# Patient Record
Sex: Female | Born: 1940
Health system: Southern US, Community
[De-identification: ages and names within clinical notes are randomized; demographics above are authoritative.]

## PROBLEM LIST (undated history)

## (undated) DIAGNOSIS — K449 Diaphragmatic hernia without obstruction or gangrene: Secondary | ICD-10-CM

## (undated) DIAGNOSIS — M25561 Pain in right knee: Secondary | ICD-10-CM

## (undated) DIAGNOSIS — E785 Hyperlipidemia, unspecified: Secondary | ICD-10-CM

## (undated) DIAGNOSIS — H269 Unspecified cataract: Secondary | ICD-10-CM

## (undated) DIAGNOSIS — B029 Zoster without complications: Secondary | ICD-10-CM

## (undated) DIAGNOSIS — K635 Polyp of colon: Secondary | ICD-10-CM

## (undated) DIAGNOSIS — H3530 Unspecified macular degeneration: Secondary | ICD-10-CM

## (undated) DIAGNOSIS — D649 Anemia, unspecified: Secondary | ICD-10-CM

## (undated) DIAGNOSIS — J189 Pneumonia, unspecified organism: Secondary | ICD-10-CM

## (undated) DIAGNOSIS — J449 Chronic obstructive pulmonary disease, unspecified: Secondary | ICD-10-CM

## (undated) DIAGNOSIS — D496 Neoplasm of unspecified behavior of brain: Secondary | ICD-10-CM

## (undated) DIAGNOSIS — M810 Age-related osteoporosis without current pathological fracture: Secondary | ICD-10-CM

## (undated) DIAGNOSIS — H43819 Vitreous degeneration, unspecified eye: Secondary | ICD-10-CM

## (undated) DIAGNOSIS — C50919 Malignant neoplasm of unspecified site of unspecified female breast: Secondary | ICD-10-CM

## (undated) DIAGNOSIS — J45909 Unspecified asthma, uncomplicated: Secondary | ICD-10-CM

## (undated) DIAGNOSIS — R600 Localized edema: Secondary | ICD-10-CM

## (undated) HISTORY — DX: Unspecified cataract: H26.9

## (undated) HISTORY — DX: Unspecified asthma, uncomplicated: J45.909

## (undated) HISTORY — PX: CATARACT EXTRACTION, BILATERAL: SHX1313

## (undated) HISTORY — DX: Diaphragmatic hernia without obstruction or gangrene: K44.9

## (undated) HISTORY — PX: MASTECTOMY: SHX3

## (undated) HISTORY — DX: Polyp of colon: K63.5

## (undated) HISTORY — DX: Pneumonia, unspecified organism: J18.9

## (undated) HISTORY — DX: Neoplasm of unspecified behavior of brain: D49.6

## (undated) HISTORY — DX: Pain in right knee: M25.561

## (undated) HISTORY — PX: SKIN CANCER EXCISION: SHX779

## (undated) HISTORY — DX: Vitreous degeneration, unspecified eye: H43.819

## (undated) HISTORY — DX: Age-related osteoporosis without current pathological fracture: M81.0

## (undated) HISTORY — DX: Malignant neoplasm of unspecified site of unspecified female breast: C50.919

## (undated) HISTORY — DX: Chronic obstructive pulmonary disease, unspecified: J44.9

## (undated) HISTORY — DX: Unspecified macular degeneration: H35.30

## (undated) HISTORY — DX: Localized edema: R60.0

## (undated) HISTORY — DX: Anemia, unspecified: D64.9

## (undated) HISTORY — DX: Zoster without complications: B02.9

## (undated) HISTORY — PX: COLONOSCOPY: SHX174

## (undated) HISTORY — DX: Hyperlipidemia, unspecified: E78.5

## (undated) HISTORY — PX: CRANIECTOMY / CRANIOTOMY FOR EXCISION OF BRAIN TUMOR: SUR320

## (undated) HISTORY — PX: POLYPECTOMY: SHX149

## (undated) HISTORY — PX: PARTIAL HYSTERECTOMY: SHX80

---

## 1999-05-30 ENCOUNTER — Ambulatory Visit (HOSPITAL_COMMUNITY): Admission: RE | Admit: 1999-05-30 | Discharge: 1999-05-30 | Payer: Self-pay | Admitting: Gastroenterology

## 1999-10-23 ENCOUNTER — Encounter: Payer: Self-pay | Admitting: Gastroenterology

## 1999-10-23 ENCOUNTER — Ambulatory Visit (HOSPITAL_COMMUNITY): Admission: RE | Admit: 1999-10-23 | Discharge: 1999-10-23 | Payer: Self-pay | Admitting: Gastroenterology

## 2001-06-08 ENCOUNTER — Encounter (INDEPENDENT_AMBULATORY_CARE_PROVIDER_SITE_OTHER): Payer: Self-pay | Admitting: Specialist

## 2001-06-08 ENCOUNTER — Ambulatory Visit (HOSPITAL_COMMUNITY): Admission: RE | Admit: 2001-06-08 | Discharge: 2001-06-08 | Payer: Self-pay | Admitting: Gastroenterology

## 2005-01-21 ENCOUNTER — Ambulatory Visit (HOSPITAL_COMMUNITY): Admission: RE | Admit: 2005-01-21 | Discharge: 2005-01-21 | Payer: Self-pay | Admitting: Gastroenterology

## 2005-08-05 ENCOUNTER — Ambulatory Visit: Payer: Self-pay | Admitting: Cardiology

## 2005-08-26 ENCOUNTER — Ambulatory Visit: Payer: Self-pay

## 2005-09-11 ENCOUNTER — Ambulatory Visit: Payer: Self-pay | Admitting: Cardiology

## 2005-09-11 ENCOUNTER — Ambulatory Visit (HOSPITAL_COMMUNITY): Admission: RE | Admit: 2005-09-11 | Discharge: 2005-09-12 | Payer: Self-pay | Admitting: Cardiology

## 2005-09-24 ENCOUNTER — Ambulatory Visit: Payer: Self-pay | Admitting: Cardiology

## 2007-02-12 ENCOUNTER — Ambulatory Visit: Payer: Self-pay | Admitting: Gastroenterology

## 2007-02-12 LAB — CONVERTED CEMR LAB
Basophils Absolute: 0.1 10*3/uL (ref 0.0–0.1)
Basophils Relative: 0.6 % (ref 0.0–1.0)
Eosinophils Absolute: 0.6 10*3/uL (ref 0.0–0.6)
Eosinophils Relative: 6 % — ABNORMAL HIGH (ref 0.0–5.0)
HCT: 36.1 % (ref 36.0–46.0)
Hemoglobin: 11.8 g/dL — ABNORMAL LOW (ref 12.0–15.0)
Lymphocytes Relative: 38.7 % (ref 12.0–46.0)
MCHC: 32.7 g/dL (ref 30.0–36.0)
MCV: 81.2 fL (ref 78.0–100.0)
Monocytes Absolute: 0.7 10*3/uL (ref 0.2–0.7)
Monocytes Relative: 7.7 % (ref 3.0–11.0)
Neutro Abs: 4.3 10*3/uL (ref 1.4–7.7)
Neutrophils Relative %: 47 % (ref 43.0–77.0)
Platelets: 213 10*3/uL (ref 150–400)
RBC: 4.44 M/uL (ref 3.87–5.11)
RDW: 15 % — ABNORMAL HIGH (ref 11.5–14.6)
WBC: 9.3 10*3/uL (ref 4.5–10.5)

## 2007-02-23 ENCOUNTER — Encounter: Payer: Self-pay | Admitting: Gastroenterology

## 2007-02-23 ENCOUNTER — Ambulatory Visit: Payer: Self-pay | Admitting: Gastroenterology

## 2007-02-23 DIAGNOSIS — D126 Benign neoplasm of colon, unspecified: Secondary | ICD-10-CM | POA: Insufficient documentation

## 2007-08-13 ENCOUNTER — Observation Stay (HOSPITAL_COMMUNITY): Admission: EM | Admit: 2007-08-13 | Discharge: 2007-08-14 | Payer: Self-pay | Admitting: Emergency Medicine

## 2007-08-14 ENCOUNTER — Encounter: Payer: Self-pay | Admitting: Internal Medicine

## 2007-08-14 DIAGNOSIS — K2971 Gastritis, unspecified, with bleeding: Secondary | ICD-10-CM | POA: Insufficient documentation

## 2007-08-14 DIAGNOSIS — K2980 Duodenitis without bleeding: Secondary | ICD-10-CM | POA: Insufficient documentation

## 2007-08-14 DIAGNOSIS — K449 Diaphragmatic hernia without obstruction or gangrene: Secondary | ICD-10-CM | POA: Insufficient documentation

## 2007-08-14 DIAGNOSIS — K2991 Gastroduodenitis, unspecified, with bleeding: Secondary | ICD-10-CM

## 2007-08-18 ENCOUNTER — Ambulatory Visit: Payer: Self-pay | Admitting: Internal Medicine

## 2007-08-30 ENCOUNTER — Ambulatory Visit: Payer: Self-pay | Admitting: Gastroenterology

## 2007-08-30 LAB — CONVERTED CEMR LAB
Basophils Absolute: 0 10*3/uL (ref 0.0–0.1)
Basophils Relative: 0.4 % (ref 0.0–1.0)
Eosinophils Absolute: 0.4 10*3/uL (ref 0.0–0.6)
Eosinophils Relative: 6.1 % — ABNORMAL HIGH (ref 0.0–5.0)
HCT: 32.7 % — ABNORMAL LOW (ref 36.0–46.0)
Hemoglobin: 10.4 g/dL — ABNORMAL LOW (ref 12.0–15.0)
Lymphocytes Relative: 25.5 % (ref 12.0–46.0)
MCHC: 31.6 g/dL (ref 30.0–36.0)
MCV: 77.8 fL — ABNORMAL LOW (ref 78.0–100.0)
Monocytes Absolute: 0.4 10*3/uL (ref 0.2–0.7)
Monocytes Relative: 6.9 % (ref 3.0–11.0)
Neutro Abs: 4 10*3/uL (ref 1.4–7.7)
Neutrophils Relative %: 61.1 % (ref 43.0–77.0)
Platelets: 278 10*3/uL (ref 150–400)
RBC: 4.27 M/uL (ref 3.87–5.11)
RDW: 27.8 % — ABNORMAL HIGH (ref 11.5–14.6)
WBC: 6.5 10*3/uL (ref 4.5–10.5)

## 2007-11-22 DIAGNOSIS — F411 Generalized anxiety disorder: Secondary | ICD-10-CM | POA: Insufficient documentation

## 2007-11-22 DIAGNOSIS — F329 Major depressive disorder, single episode, unspecified: Secondary | ICD-10-CM

## 2007-11-22 DIAGNOSIS — E782 Mixed hyperlipidemia: Secondary | ICD-10-CM | POA: Insufficient documentation

## 2007-11-22 DIAGNOSIS — C50919 Malignant neoplasm of unspecified site of unspecified female breast: Secondary | ICD-10-CM | POA: Insufficient documentation

## 2007-11-22 DIAGNOSIS — F339 Major depressive disorder, recurrent, unspecified: Secondary | ICD-10-CM | POA: Insufficient documentation

## 2007-11-22 DIAGNOSIS — K219 Gastro-esophageal reflux disease without esophagitis: Secondary | ICD-10-CM | POA: Insufficient documentation

## 2007-11-22 DIAGNOSIS — J45909 Unspecified asthma, uncomplicated: Secondary | ICD-10-CM | POA: Insufficient documentation

## 2008-05-01 ENCOUNTER — Ambulatory Visit (HOSPITAL_COMMUNITY): Admission: RE | Admit: 2008-05-01 | Discharge: 2008-05-01 | Payer: Self-pay | Admitting: Family Medicine

## 2010-07-30 ENCOUNTER — Encounter: Payer: Self-pay | Admitting: Cardiology

## 2010-08-25 ENCOUNTER — Encounter: Payer: Self-pay | Admitting: Family Medicine

## 2010-12-17 NOTE — Assessment & Plan Note (Signed)
[facility]                         GASTROENTEROLOGY OFFICE NOTE   TANIEKA, POWNALL                   MRN:          [id number]  DATE:02/12/2007                            DOB:          [birth year]    REFERRING PHYSICIAN:  Ernestina Penna, M.D.   NEW GI CONSULTATION   REASON FOR REFERRAL:  Dr. Christell Constant asked me to evaluate Ms. Mccollom in  consultation for her heme-positive stool, chronic constipation.   HPI:  Krystal [name] is a very pleasant 70 year old woman who has had  trouble with constipation for many years.  She says usually she will  move her bowels once or twice a week at most.  She will get increasingly  uncomfortable until she finally moves her bowels.  She also has to  strain when she does move her bowels.  She does not see overt bleeding.  She has been followed by gastroenterologist, Dr. Dorena Cookey for several  years.  He performed a colonoscopy in 2002 for her for constipation and  a family history of colon polyps.  He did find 1 small ascending colon  polyp, but this was hyperplastic.  She was found to be heme-positive 4  years later.  He repeated this colonoscopy in June of 2006 and it was  normal.  His plan was to repeat the colonoscopy 5 years from then, which  would be 2011.  She recently had Hemoccult testing performed, and it was  positive out of 2 samples.   REVIEW OF SYSTEMS:  Notable for stable weight.  Otherwise, essentially  normal and is available on our nursing intake sheet.   PAST MEDICAL HISTORY:  Brain tumor removed in 1996, breast cancer in  June of 1979, hysterectomy in 1978, elevated cholesterol.   CURRENT MEDICATIONS:  Singulair.  Aspirin.  Zocor.  Prevacid.  Theophylline.  Zyrtec.  Fish oil.  Diazepam.   ALLERGIES:  FOSAMAX.   SOCIAL HISTORY:  Widowed.  Nonsmoker.  Nondrinker.   FAMILY HISTORY:  Mother with colon polyps.  No colon cancer in family.   PHYSICAL EXAM:  Height 5 feet 5 inches, weight 172  pounds.  Blood  pressure 122/82, pulse 88.  CONSTITUTIONAL:  Generally well-appearing.  NEUROLOGIC:  Alert and oriented x3.  EYES:  Extraocular motions intact.  MOUTH:  Oropharynx moist, no lesions.  NECK:  Supple.  No lymphadenopathy.  CARDIOVASCULAR:  Heart is regular rate and rhythm.  LUNGS:  Clear to auscultation bilaterally.  ABDOMEN:  Soft, nontender, nondistended, normal bowel sounds.  EXTREMITIES:  No lower extremity edema.  SKIN:  No rashes or lesions on the visible extremities.   ASSESSMENT AND PLAN:  A 70 year old woman with chronic constipation,  heme-positive stool.   First, for her chronic constipation, Zocor is a well-known drug that can  cause constipation.  This could be possibly changed to a different  cholesterol-lowering agent under the discretion of her primary care  physician or her cardiologist.  I have given her samples for MiraLax and  instructed her to take 1 scoop a day, increase to 2 scoops a day.  The  goal will be 1 bowel movement  every day or 2.   She has had 2 colonoscopies in the past 6 years, the last was just 2  years ago, and that was for heme-positive stool.  She is heme-positive  again, and I told her that it is very unlikely that she has anything new  growing in her colon, since she just had a normal colonoscopy 2 years  ago, but the safest thing would be to check again by colonoscopy, so I  will arrange for that to be done at her soonest convenience.  If that  colonoscopy is normal, I would advise no colorectal cancer screening  (including FOBT cards) for at least 5 years, and would consider simply  repeating colonoscopy 10 years from that date.  She will get a CBC to  check and see if she is anemic since she was heme-positive.  She does  not appear to be so clinically, but we will check just to be sure.     Rachael Fee, MD  Electronically Signed    DPJ/MedQ  DD: 02/12/2007  DT: 02/13/2007  Job #: [id number]   cc:   Ernestina Penna,  M.D.

## 2010-12-17 NOTE — Consult Note (Signed)
[name], [name]            ACCOUNT NO.:  [id number]   MEDICAL RECORD NO.:  [id number]          PATIENT TYPE:  OBV   LOCATION:  1846                         FACILITY:  [facility]   PHYSICIAN:  Iva Boop, MD,FACGDATE OF BIRTH:  September 22, 1940   DATE OF CONSULTATION:  08/13/2007  DATE OF DISCHARGE:                                 CONSULTATION   Referral from Dr. Kathi Der office today.  Patient complaining of fatigue  and hemoglobin found to be 6.9 with MCV of 67 on labs obtained  yesterday.   HISTORY OF PRESENT ILLNESS:  Krystal [name] is a pleasant 70 year old white  female who has a remote history of breast cancer, history of colon  polyps, gastroesophageal reflux disease, anemia and constipation.  Early  this century around the 2001 she did require 2 units of blood because of  anemia.  She has had intermittent problems with fecal occult blood-  positive stools but not overt bleeding such as melena or blood per  rectum.  The patient went to her primary care physician yesterday  complaining of dysuria, some increased pelvic discomfort and symptoms  consistent with a UTI.  She also says that she would see some blood on  the tissue from the urethra after wiping.  She was treated for a UTI  with Cipro and a pyridine drug.  Some serum lab work was obtained and  this included a CBC.  These came back showing a low hemoglobin of 6.2  with a low MCV of 66.8.  She was brought back to the office today and  stool was heme-positive, though it is unclear as to whether this was  melena or not.  She gives a history of having been on iron since around  2001, when she had required transfusions of blood to treat anemia.  About 2 months ago her insurance company would not pay for the  prescription refill and she stopped taking it.  She does say that for  about a week or more she has been weak, has had some dizziness but  nothing near syncope.  She denies chest pain, shortness of breath.  She  denies  use of nonsteroidals; in fact, she does not use aspirin because  it had been associated with a GI bleed in the past.  The patient does  take a daily Prevacid as she has for many years.   The patient has undergone multiple EGDs since 2001.  These days of  consistently show mild esophagitis and were performed by Dr. Dorena Cookey,  with the latest endoscopy having been performed June 2006 showing  minimal esophagitis.  She has also had a history of colon polyps and her  latest colonoscopy was performed by Dr. Christella Hartigan on February 23, 2008/  this  showed a small ascending colon polyp.   The patient tends to have chronic lower abdominal and pelvic pain, which  is not severe and is longstanding.  She also has been feeling a burning  tenderness when she touches a new finding of a tiny subcutaneous nodule  in the epigastrium and right upper quadrant.  She was to have an  ultrasound today but it is been cancelled because of the discovery of  the anemia.  The patient also said that she has not have been having any  lower extremity edema or shortness of breath.   The patient was seen at Dr. Kathi Der office today and he contacted Dr.  Christella Hartigan, who had her sent down to the emergency room at [facility] for  evaluation.   ALLERGIES:  To FOSAMAX, to EGGS, to MIACALCIN, to ACTONEL, to MEVACOR.   MEDICATIONS:  1. Cipro 250 mg one p.o. b.i.d. for 7 days.  She has had two doses of      this so far.  2. Phenazopyridine  one p.o. t.i.d. for 2 days.  3. Proventil inhaler 2 puffs two to three times a day as needed.  4. Lasix 20 mg p.o. b.i.d.  5. Fexofenadine 180 mg daily.  6. Simvastatin 20 mg daily.  7. Theophylline 200 mg CR once daily.  8. Singulair 10 mg daily.  9. Restasis eye drops 1 drop to each eye twice daily.  10.Prevacid 30 mg once daily.  11.Diazepam 5 mg one to three times a day p.r.n.  12.Darvocet-N 100 one p.o. daily p.r.n.  She can take it more often      but she just uses it once a day at  night.   PAST MEDICAL HISTORY:  1. Breast cancer of the right breast, status post mastectomy and      radiation therapy in 1979.  2. Status post nasal polypectomy in 1978.  3. Partial hysterectomy, 1978, ovaries are intact.  4. Status post resection of a benign brain lesion, 1986.  5. Asthma.  6. Anxiety.  7. Depression.  8. Seasonal rhinitis.  9. Colon polyps.  10.Gastroesophageal reflux disease.  11.Question of possible esophageal stricture in March 2001.  She was      dilated then by Dr. Madilyn Fireman.  12.Noncardiac chest pain in February 2007, underwent cardiac      catheterization showing EF of 65% and minimal plaque.   SOCIAL HISTORY:  The patient formerly worked on her farm.  She is a  widow.  She lives alone.  She is independent.  Prior tobacco use.  No  alcohol.   FAMILY HISTORY:  CABG, MI, CHF and pacemaker.  Mother had a history of  colon polyps.  There is no family history of colon cancer.   REVIEW OF SYSTEMS:  GU: As above.  PULMONARY:  Occasional dry cough.  ENT:  Does have chronic sinus congestion.  GI:  As above.  MUSCULOSKELETAL:  She has had intermittent longstanding right back and  shoulder pain.  Right now it has been a little more active than usual.  Appetite is good.  No weight loss.  Regarding her constipation, she has  used laxatives including MiraLax and milk of magnesia in the past but  they do not help a lot, so she has just quit using them and settles for  having very infrequent bowel movements.  Her last bowel movement was 4  days ago.  She does undergo yearly mammograms.  DERMATOLOGIC:  The  patient denies rashes, itching or history of skin cancer.   LABS:  Hemoglobin of 6.6 with hematocrit of 21.6, MCV of 68.4, platelets  of 289,000 and white blood cell count 5.8.  PT 13.1, INR 1.0, PTT is 27.  Sodium 138, potassium 3.7, chloride 108, CO2 25, glucose 105.  BUN 14,  creatinine 0.66, calcium 9.0.  She has been typed and crossed for  2  units of blood as  well.   EXAM:  Blood pressure 126/67, pulse is 97, temperature is 97.1, room air  saturation 99%.  Weight yesterday at Dr. Kathi Der office was 175 pounds.  The patient is a pale, pleasant, elderly white female.  HEENT:  No icterus.  Positive for pallor.  Extraocular movements are  intact.  Oropharynx:  Mucosa is moist and clear.  The patient does sound  to have nasal congestion but does not have active rhinorrhea.  NECK:  No JVD, no masses.  PULMONARY:  Chest is clear to auscultation and percussion bilaterally  with good breath sounds.  No cough.  No shortness of breath.  CARDIOVASCULAR:  Regular rate and rhythm.  No murmurs, rubs or gallops  audible.  GI:  Abdomen is soft, mildly obese, nondistended, with active bowel  sounds.  No hepatosplenomegaly.  There is a tiny sub-millimeter  subcutaneous lesion at the epigastric right upper quadrant region.  It  seems benign.  It does not seem to be tethered to tissues beneath the  subcutaneous layer.  It is slightly tender.  She also has some minimal  tenderness without guarding or rebound in the pelvic region that is  nonfocal.  RECTAL:  There is no stool bolus in the vault, and a scant amount of  brown stool is fecal occult blood-negative.  EXTREMITIES:  No cyanosis, clubbing or edema.  NEUROLOGIC:  The patient moves all fours.  She is alert and oriented x3.  No tremor.  DERMATOLOGIC:  No palmar erythema.  No spider angiomata.  NODES:  There are some swollen anterior cervical nodes bilaterally.   ASSESSMENT:  1. Recurrent anemia.  She has a low MCV and is almost certainly iron-      deficient.  She has not been taking her prescribed iron for 2      months.  She needs transfusions and she does have symptomatic      anemia in terms of weakness and dizziness.  2. Fecal occult blood-positive.  She had positive stools at Dr.      Kathi Der office but today in the ER the stool is negative although      the specimen is not ideal.  Question if  this is from      gastroesophageal reflux disease or perhaps the patient has small-      bowel arteriovenous malformations.  This would be probably more      likely given the extent of her anemia  3. Chronic constipation.  4. Longstanding gastroesophageal reflux disease, which has generally      been mild when scoped on various occasions within the last 10 knee      years.  She takes a daily proton pump inhibitor.  5. Remote history of breast cancer and remote history of benign brain      lesion.  6. Urinary tract infection, under treatment for this.  Note that she      does have blood per urethra which has been visible.  Perhaps this      is a more chronic thing than we are aware of and has contributed to      her anemia.   PLAN:  1. For her to get transfused with 2 units of blood today and then go      home.  Will plan to set up on outpatient visit with Dr. Christella Hartigan      within the next 2-3 weeks.  She has been advised to come  back, to      call Dr. Christella Hartigan or Dr. Christell Constant again is she has recurrent symptoms or      any obvious GI bleeding.  2. The patient may need a capsule endoscopy in order to a evaluate her      small bowel, but it has been at least a couple of years since she      had an upper endoscopies so probably should have an upper endoscopy      first before going to the capsule endoscopy. Will possible do that      tomorrow.  Besides restarting her on iron twice daily, we will also      have her take a vitamin C supplement to augment absorption of iron.      Jennye Moccasin, PA-C      Iva Boop, MD,FACG  Electronically Signed    SG/MEDQ  D:  08/13/2007  T:  08/13/2007  Job:  [id number]

## 2010-12-17 NOTE — Assessment & Plan Note (Signed)
[facility]                         GASTROENTEROLOGY OFFICE NOTE   THELDA, GAGAN                   MRN:          [id number]  DATE:08/30/2007                            DOB:          [birth year]    PRIMARY CARE PHYSICIAN:  Ernestina Penna, M.D.   GASTROINTESTINAL PROBLEM LIST:  1. Personal history of colonic adenomas.  Colonoscopy in July 2008,      single small colon polyp that was a tubular adenoma.  Next      colonoscopy scheduled for July 2013.  2. 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 Yancey Flemings by      endoscopy.  She is to be on iron indefinitely.   INTERVAL HISTORY:  I last saw Ms. Mcglynn in July 2008.  Since then she  was admitted to the hospital with rather extreme anemia, fatigue, no  overt GI bleeding but she was heme positive.  She was found to have a  hemoglobin in the upper 6s.  She was transfused 2 units.  She had an EGD  by one of my partners, Dr. Yancey Flemings that day or the next morning and  he found some fairly obvious Sheria Lang type erosions in a hiatal hernia.  She had been on iron prior to that procedure but was having some  insurance difficulties and was not on the iron for at least 2-3 months  prior to then.  Since then she has been on iron twice daily.   She has no chest pain, no shortness of breath, no syncope or presyncope.   CURRENT MEDICATIONS:  Singulair, aspirin, Zocor, Prevacid, Theo,  theophylline, fish oil, diazepam, fexofenadine, vitamin C, ferrous  sulfate twice daily.   PHYSICAL EXAMINATION:  VITAL SIGNS:  Weight 175 pounds which is up 3  pounds since her last visit.  Blood pressure 124/80, pulse 72.  CONSTITUTIONAL:  Generally well-appearing, slightly pale.  EYES:  Anicteric sclerae.  LUNGS:  Clear to auscultation bilaterally.  ABDOMEN:  Soft, nontender, nondistended.  Normal bowel sounds.   ASSESSMENT:  A 70 year old woman  with chronic heme positive anemia  likely from [facility] erosions in her hiatal hernia.   PLAN:  She will stay on iron indefinitely 1-2 pills daily.  I will get a  CBC on her today and likely also in 3 months from now just to make sure  that her hemoglobin gets back up to normal after  she has been on the iron supplements.  She is already set for followup  for her tubular adenomas and does not need routine GI visit prior to  that unless something new comes up.     Rachael Fee, MD  Electronically Signed    DPJ/MedQ  DD: 08/30/2007  DT: 08/30/2007  Job #: [id number]   cc:   Ernestina Penna, M.D.

## 2010-12-20 NOTE — Op Note (Signed)
NAMETRUDIE, [name]            ACCOUNT NO.:  [id number]   MEDICAL RECORD NO.:  [id number]          PATIENT TYPE:  AMB   LOCATION:  ENDO                         FACILITY:  [facility]   PHYSICIAN:  John C. Madilyn Fireman, M.D.    DATE OF BIRTH:  23-Mar-1941   DATE OF PROCEDURE:  01/21/2005  DATE OF DISCHARGE:                                 OPERATIVE REPORT   PROCEDURE:  Colonoscopy.   INDICATIONS FOR PROCEDURE:  Heme-positive stool   DESCRIPTION OF PROCEDURE:  The patient was placed in the left lateral  decubitus position and placed on pulse monitor with continuous low-flow  oxygen delivered by nasal cannula. She was sedated with 2 mg IV Versed in  addition to the medicine given for the previous EGD. The Olympus video  colonoscope was inserted into the rectum and advanced to the cecum,  confirmed by transillumination at McBurney's point and visualization of the  ileocecal valve and appendiceal orifice. The prep was excellent. The cecum,  ascending, transverse, descending and sigmoid colon all appeared normal with  no masses, polyps, diverticula or other mucosal abnormalities. The rectum  likewise appeared normal and retroflexed view of the anus revealed no  obvious internal hemorrhoids. The scope was then withdrawn and the patient  returned to the recovery room in stable condition. She tolerated the  procedure well and there were no immediate complications.   IMPRESSION:  Normal colonoscopy.   PLAN:  Repeat study in five years.       JCH/MEDQ  D:  01/21/2005  T:  01/21/2005  Job:  [id number]   cc:   Ernestina Penna, M.D.  [address] [place]  [place] [address]  Fax: [phone]

## 2010-12-20 NOTE — Cardiovascular Report (Signed)
Krystal [name], BLACKSON            ACCOUNT NO.:  [id number]   MEDICAL RECORD NO.:  [id number]          PATIENT TYPE:  OIB   LOCATION:  2899                         FACILITY:  [facility]   PHYSICIAN:  Rollene Rotunda, M.D.   DATE OF BIRTH:  July 18, 1941   DATE OF PROCEDURE:  09/11/2005  DATE OF DISCHARGE:                              CARDIAC CATHETERIZATION   PROCEDURE:  Left heart catheterization/coronary arteriography.   INDICATIONS FOR PROCEDURE:  Evaluate patient with an abnormal Cardiolite  suggesting moderate anterior wall ischemia.   PROCEDURE NOTE:  Left heart catheterization performed via the right femoral  artery.  The artery was cannulated using an anterior wall puncture.  A 6  French arterial sheath was inserted via modified Seldinger technique.  Preformed Judkins and a pigtail catheter were utilized.  The patient  tolerated the procedure well and left the lab in stable condition.   RESULTS:  Hemodynamics:  LV 151/18, AO 143/107.   Coronaries:  The left main was normal.  The LAD was normal.  There was a mid  diagonal which was moderate and normal.  The circumflex was essentially  terminating as a large mid obtuse marginal.  There was a small first obtuse  marginal which was normal.  The large mid obtuse marginal and AV groove  vessel was normal.  The right coronary artery was a large dominant vessel.  There was proximal 25% stenosis.  The PDA was large and normal.  The  posterolateral was large and normal.   Left ventriculogram:  The left ventriculogram was obtained in the RAO  projection.  The EF was approximately 65% with normal wall motion.   CONCLUSION:  Minimal coronary plaque.  Normal left ventricular function.   PLAN:  No further cardiac workup is suggested.  She will continue with  primary risk reduction per Dr. Christell Constant.           ______________________________  Rollene Rotunda, M.D.     JH/MEDQ  D:  09/11/2005  T:  09/11/2005  Job:  [id number]   cc:   Ernestina Penna, M.D.  Fax: [phone]

## 2010-12-20 NOTE — Procedures (Signed)
[place]  Patient:    Krystal [name], Krystal [name]                   MRN: [id number] Proc. Date: 10/23/99 Adm. Date:  [id number] Attending:  Louie Bun CC:         Monica Becton, M.D.                           Procedure Report  PROCEDURE PERFORMED:  Esophagogastroduodenoscopy with esophageal dilation.  INDICATION FOR PROCEDURE:  Patient with chronic poorly controlled reflux with severe esophagitis, possible esophagus ulcers on previous EGD.  She has developed dysphagia and reflux strictures suspected. Procedure is to assess healing of her previous esophagitis and esophageal ulcers and perform dilatation if felt safe.  DESCRIPTION OF PROCEDURE:  The patient was placed in the left lateral decubitus  position and placed on the pulse monitor with continuous low flow oxygen delivered by nasal cannula. She was sedated with 50 mg IV Demerol and 4 mg of IV Versed. The Olympus video endoscope was advanced under direct vision into the oropharynx and esophagus.  The esophagus was straight, slightly tortuous but of normal caliber  with the squamocolumnar line at 36 cm above a 4 cm hiatal hernia as before. There was minimal erosive esophagitis with much improvement in the appearance compared with the last procedure.  There was a possible subtle stricture appreciated when the LAS was fully relaxed.  The stomach was entered and a small amount of liquid secretions were suctioned from the fundus.  Retroflex view of the cardiac confirmed the large hiatal hernia and it was otherwise unremarkable.  The fundus, body, antrum and pylorus all appeared normal.  The duodenum was entered and both bulb and second portion were well inspected and appeared to be within normal limits.  A Savory guide wire was placed through the endoscope channel and the scope withdrawn. A single Savory dilator of 16 mm diameter was passed under fluoroscopic visualization with  moderate resistance and no blood seen on withdrawal.  The dilator was then withdrawn and the patient returned to the recovery room in stable condition. She tolerated the procedure well.  There were no immediate complications.  IMPRESSION: Hiatal hernia with healing of previous esophagitis and probable esophageal stricture.  PLAN:  Will advance diet and observe response of dilation.  Will consider aggressive antipeptic medication.  Follow up in the office in a few weeks and discuss the possibility of antireflux surgery. DD:  10/23/99 TD:  10/24/99 Job: [id number]

## 2010-12-20 NOTE — Op Note (Signed)
[name], Krystal [name]            ACCOUNT NO.:  [id number]   MEDICAL RECORD NO.:  [id number]          PATIENT TYPE:  AMB   LOCATION:  ENDO                         FACILITY:  [facility]   PHYSICIAN:  John C. Madilyn Fireman, M.D.    DATE OF BIRTH:  10-22-1940   DATE OF PROCEDURE:  01/21/2005  DATE OF DISCHARGE:                                 OPERATIVE REPORT   PROCEDURE:  Esophagogastroduodenoscopy.   INDICATIONS FOR PROCEDURE:  Epigastric abdominal pain and heme-positive  stools.   PROCEDURE:  The patient was placed in the left lateral decubitus position  and placed on the pulse monitor with continuous low-flow oxygen delivered by  nasal cannula.  She was sedated with 50 mcg IV fentanyl and 4 mg IV Versed.  The Olympus video endoscope was advanced under direct vision into the  oropharynx and esophagus.  The esophagus was straight and of normal caliber  with the squamocolumnar line at 36 cm.  There were minimal erosions at the  GE junction with no exudate, consistent with mild esophagitis.  There was a  4-cm hiatal hernia distal to the squamocolumnar line with no erosions or  ulcers seen within it.  The stomach was entered, and a small amount of  liquid secretions were suctioned from the fundus.  Retroflexed view of the  cardia confirmed a hiatal hernia, was otherwise unremarkable.  The fundus,  body, antrum and pylorus all appeared normal.  A CLO-test was obtained.  The  duodenum was entered, and both the bulb and second portion were well-  inspected and appeared to be within normal limits.  The scope was then  withdrawn and the patient prepared for colonoscopy.  She tolerated the  procedure well, and there were no immediate complications.   IMPRESSION:  1.  Hiatal hernia with minimal esophagitis.  2.  Otherwise normal study.   PLAN:  Will await CLO-test and treat for eradication if Helicobacter  positive.  Otherwise, treat with PPI and will proceed with colonoscopy.       JCH/MEDQ  D:   01/21/2005  T:  01/21/2005  Job:  [id number]   cc:   Ernestina Penna, M.D.  [address] [place]  [place] [address]  Fax: [phone]

## 2010-12-20 NOTE — Discharge Summary (Signed)
NAMEGRACELYNNE, BENEDICT            ACCOUNT NO.:  [id number]   MEDICAL RECORD NO.:  [id number]          PATIENT TYPE:  OIB   LOCATION:  5151                         FACILITY:  [facility]   PHYSICIAN:  Rollene Rotunda, M.D.   DATE OF BIRTH:  05-04-1941   DATE OF ADMISSION:  09/11/2005  DATE OF DISCHARGE:  09/12/2005                                 DISCHARGE SUMMARY   CHIEF COMPLAINT:  Chest pain, no significant coronary artery disease by  catheterization this admission (cath done secondary to abnormal Cardiolite.   SECONDARY DIAGNOSES:  1.  Dyspnea on exertion.  2.  Hyperlipidemia.  3.  Family history of coronary artery disease.  4.  Anxiety/depression.  5.  Peptic ulcer disease.  6.  Gastroesophageal reflux disease.  7.  Asthma.  8.  Breast cancer.  9.  Status post hysterectomy.  10. Nasal polyp resection.  Right mastectomy and a benign brain tumor      resection.   ALLERGIES OR INTOLERANCES:  To FOSAMAX with swelling and ANACIN with GI  bleed.   PROCEDURES:  1.  Cardiac catheterization  2.  Coronary arteriogram.  3.  Left ventriculogram.   HOSPITAL COURSE:  Time at discharge 22 minutes.  Krystal [name] is a 70 year old  female with no history of coronary artery disease.  She had dyspnea on  exertion that was occasionally associated with chest pain and Dr. Christell Constant  referred her to Dr. Antoine Poche.  Dr. Antoine Poche evaluated her and when her  stress test was abnormal, showing ischemia of the distal anterior wall and  apex.  A cardiac catheterization was recommended.  She was admitted for this  on September 11, 2005.   The cardiac catheterization showed a 25% dominant proximal RCA, but no other  disease, and her EF was normal at 65%.  He felt that she has noncardiac  dyspnea on exertion and chest pain; and recommended followup with Dr. Christell Constant.  The next day, Krystal [name] respiratory status was at baseline and her O2  saturation was 96% on room air.  Her groin was without ecchymosis, bruits  or  hematoma.  She was evaluated by Dr. Antoine Poche and considered stable for  discharge and is to followup with Dr. Christell Constant as an outpatient.   DISCHARGE INSTRUCTIONS:  Her activity level is to include no driving for 2  days and no lifting for a week.  She is to call our office for any problems  with the cath site.  She is to get a groin check within two weeks.  She is  to followup with Dr. Antoine Poche as needed.   DISCHARGE MEDICATIONS:  1.  __________ 400 mg a day.  2.  Singulair 10 mg a day.  3.  Zocor 20 mg a day.  4.  Zyrtec daily.  5.  Pravachol 30 mg a day.  6.  Valium 5 mg p.r.n.  7.  Darvocet p.r.n.  8.  DuoNebs q.h.s.  9.  She is to clarify with Dr. Christell Constant whether she should be on Zocor or      Pravachol.      Theodore Demark, P.A. [facility]  ______________________________  Rollene Rotunda, M.D.    RB/MEDQ  D:  09/12/2005  T:  09/12/2005  Job:  [id number]   cc:   Ernestina Penna, M.D.  Fax: [phone]   Rollene Rotunda, M.D.  [phone]  [place]  [place] [address]

## 2010-12-20 NOTE — H&P (Signed)
NAMEBRANNON, Krystal [name]            ACCOUNT NO.:  [id number]   MEDICAL RECORD NO.:  [id number]          PATIENT TYPE:  OIB   LOCATION:  2899                         FACILITY:  [facility]   PHYSICIAN:  Rollene Rotunda, M.D.   DATE OF BIRTH:  August 30, 1940   DATE OF ADMISSION:  09/11/2005  DATE OF DISCHARGE:                                HISTORY & PHYSICAL   PRIMARY CARE PHYSICIAN:  Dr. Rudi Heap   PRIMARY CARDIOLOGIST:  Dr. Rollene Rotunda   CHIEF COMPLAINT:  Dyspnea on exertion/chest pain.   HISTORY OF PRESENT ILLNESS:  Krystal [name] is a 70 year old female with no  known history of coronary artery disease.  She was evaluated by Dr. Antoine Poche  on August 05, 2005 after Dr. Vernon Prey referred her for dyspnea on exertion  that was occasionally associated with chest pain.   Krystal [name] describes the dyspnea on exertion as consistent, but says it has  not really changed much recently.  She also has hiatal hernia/reflux  symptoms and states that she gets chest pain both occasionally while lying  down which is improved by sitting up and while she is walking she will first  become short of breath and then get chest pain which resolves with rest.  The chest pain is substernal and radiates around under her left breast.  Whether the pain starts with or without exertion the pain is exactly the  same.  It is not consistent either with a supine position or with exertion.  She just gets it occasionally and feels she gets it several times a week.  She also complains of chronic orthopnea and occasional paroxysmal nocturnal  dyspnea.  She does not have any lower extremity edema or bloating.  She is  pain-free at the time of examination and denies shortness of breath.   PAST MEDICAL HISTORY:  She has no history of diabetes or hypertension.  She  has a history of hyperlipidemia and a family history of premature coronary  artery disease.  She also has a history of breast cancer, asthma,  anxiety/depression,  peptic ulcer disease, and gastroesophageal reflux  disease.   PAST SURGICAL HISTORY:  1.  Hysterectomy.  2.  Nasal polyp resection.  3.  Right mastectomy.  4.  Benign brain tumor resected in 1986.   ALLERGIES:  She had problems with FOSAMAX which she says caused swelling of  her tongue and her throat to close.  She took Anacin for a while for her hip  pain which she has chronically but she says this caused a bleeding ulcer  greater than 20 years ago and she has not had aspirin since.   CURRENT MEDICATIONS:  1.  Valium 5 mg up to t.i.d. p.r.n.  2.  Uniphyl 400 mg a day.  3.  Singulair 10 mg a day.  4.  Zocor 20 mg a day.  5.  Zyrtec daily.  6.  Pravachol 30 mg a day.  7.  Darvocet p.r.n.  8.  DuoNebs q.h.s.  9.  Prevacid 30 mg a day.   SOCIAL HISTORY:  [place].  She  is disabled.  She only smoked for a couple of years and quit more than 15  years ago.  She has never abused alcohol or drugs.   FAMILY HISTORY:  Her mother is alive at age 33 and has a pacemaker.  Her  father died in his 31s with a history of heart disease.  She has one brother  that died of a massive heart attack at age 65.  She has another brother that  is alive and has had bypass surgery twice and a sister that is also alive,  but has had bypass surgery.  She has other siblings with heart failure, but  no others with heart disease of which she is aware.   REVIEW OF SYSTEMS:  Significant for chronic hip pain which significantly  limits her activities and occasionally wakes her up at night.  She also has  occasional headaches.  She has reflux symptoms for which she takes Prevacid  30 mg a day.  The chest pain and dyspnea on exertion as described above.  She has chronic joint pains other than her hip.  She has occasional rhinitis  symptoms.   REVIEW OF SYSTEMS:  Otherwise negative.   PHYSICAL EXAMINATION:  VITAL SIGNS:  Her blood pressure is 127/49 with a  heart  rate of 92 and respiratory rate of 16, O2 saturation 96% on room air.  Temperature is 97.  GENERAL:  She is a well-developed, elderly white female in no acute  distress.  Her body mass index is 31.  HEENT:  Head is normocephalic, atraumatic with pupils are equal, round, and  reactive to light and accommodation.  NECK:  There is no JVD, no thyromegaly and no carotid bruits are  appreciated.  No adenopathy is noted.  LUNGS:  She has a few rales in the left base, but are otherwise clear.  CARDIOVASCULAR:  Her heart is regular in rate and rhythm with an S1 and S2  and no significant murmur, rub, or gallop is noted.  SKIN:  No rashes or lesions are noted.  ABDOMEN:  Soft and has active bowel sounds.  There is some slight upper  quadrant tenderness which she says is chronic for her.  There is no  guarding.  EXTREMITIES:  She has 2+ pulses in all four extremities and no femoral  bruits are appreciated.  MUSCULOSKELETAL:  There is no joint deformity or effusions and no spine or  CVA tenderness is noted.  NEUROLOGIC:  She is alert and oriented with cranial nerves II-XII grossly  intact.   EKG:  Sinus rhythm, rate 81 with no acute ischemic changes.   1.  Dyspnea on exertion/chest pain.  Krystal [name] had an adenosine Myoview      performed on August 26, 2005.  It showed an EF of 72%.  There was      moderate ischemia of the distal anterior wall and apex.  This was      evaluated by Dr. Antoine Poche and he felt that cardiac catheterization was      indicated.  She is here today for the procedure.  Krystal [name] is      otherwise stable and is to continue on her home medications including      Prevacid 30 mg a day.      She will need aspirin and Plavix if there is an intervention, but will      be followed carefully for increase in her reflux symptoms.  2.  Krystal [name] is  otherwise stable and will be continued on her other home      medications.     Theodore Demark, P.A. [facility]     ______________________________  Rollene Rotunda, M.D.    RB/MEDQ  D:  09/11/2005  T:  09/11/2005  Job:  [id number]

## 2010-12-20 NOTE — Procedures (Signed)
[facility]  Patient:    Krystal [name], Krystal [name] Number: [id number] MRN: [id number]          Service Type: END Location: ENDO Attending Physician:  Louie Bun Proc. Date: 06/08/01 Admit Date:  06/08/2001   CC:         Monica Becton, M.D.  Lynett Fish, M.D.   Procedure Report  PROCEDURE:  Colonoscopy with polypectomy.  INDICATION FOR PROCEDURE:  Family history of colon polyps, personal history of breast cancer and recent worsening of constipation.  DESCRIPTION OF PROCEDURE:  The patient was placed in the left lateral decubitus position and placed on the pulse monitor with continuous low-flow oxygen delivered by nasal cannula.  She was sedated with 40 mg of IV Demerol and 4 mg of IV Versed.  The Olympus video colonoscope was inserted into the rectum and advanced to the cecum, confirmed by transillumination at McBurneys point and visualization of the ileocecal valve and appendiceal orifice.  The prep was excellent.  Within the base of the cecum, there was an 8 mm sessile polyp which was fulgurated by hot biopsy.  The remainder of the cecum, ascending, transverse, descending, and colon appeared normal with no masses, polyps, diverticula, or other mucosal abnormalities.  The rectum likewise appeared normal, and retroflexed view of the anus revealed no obvious internal hemorrhoids.  The colonoscope was then withdrawn, and the patient returned to the recovery room in stable condition.  She tolerated the procedure well, and there were no immediate complications.  IMPRESSION: 1. Ascending colon polyp. 2. Otherwise normal colonoscopy. Attending Physician:  Louie Bun DD:  06/08/01 TD:  06/09/01 Job: [id number]

## 2010-12-26 ENCOUNTER — Other Ambulatory Visit: Payer: Self-pay

## 2011-04-24 LAB — CBC
HCT: 28.6 — ABNORMAL LOW
Hemoglobin: 8.9 — ABNORMAL LOW
MCHC: 30.7
MCHC: 31.2
MCV: 68.4 — ABNORMAL LOW
MCV: 71.2 — ABNORMAL LOW
Platelets: 289
RBC: 4.01
RDW: 20.3 — ABNORMAL HIGH

## 2011-04-24 LAB — CROSSMATCH

## 2011-04-24 LAB — PROTIME-INR: Prothrombin Time: 13.1

## 2011-04-24 LAB — BASIC METABOLIC PANEL
BUN: 14
CO2: 25
Chloride: 108
Creatinine, Ser: 0.66

## 2011-04-24 LAB — ABO/RH: ABO/RH(D): O POS

## 2011-09-02 DIAGNOSIS — J441 Chronic obstructive pulmonary disease with (acute) exacerbation: Secondary | ICD-10-CM | POA: Diagnosis not present

## 2011-09-02 DIAGNOSIS — R5383 Other fatigue: Secondary | ICD-10-CM | POA: Diagnosis not present

## 2011-09-02 DIAGNOSIS — E785 Hyperlipidemia, unspecified: Secondary | ICD-10-CM | POA: Diagnosis not present

## 2011-09-02 DIAGNOSIS — R0602 Shortness of breath: Secondary | ICD-10-CM | POA: Diagnosis not present

## 2011-09-02 DIAGNOSIS — R5381 Other malaise: Secondary | ICD-10-CM | POA: Diagnosis not present

## 2011-09-02 DIAGNOSIS — R072 Precordial pain: Secondary | ICD-10-CM | POA: Diagnosis not present

## 2011-09-05 DIAGNOSIS — Z1212 Encounter for screening for malignant neoplasm of rectum: Secondary | ICD-10-CM | POA: Diagnosis not present

## 2011-12-31 DIAGNOSIS — J441 Chronic obstructive pulmonary disease with (acute) exacerbation: Secondary | ICD-10-CM | POA: Diagnosis not present

## 2011-12-31 DIAGNOSIS — E785 Hyperlipidemia, unspecified: Secondary | ICD-10-CM | POA: Diagnosis not present

## 2011-12-31 DIAGNOSIS — D485 Neoplasm of uncertain behavior of skin: Secondary | ICD-10-CM | POA: Diagnosis not present

## 2012-01-01 ENCOUNTER — Encounter: Payer: Self-pay | Admitting: Gastroenterology

## 2012-02-10 ENCOUNTER — Ambulatory Visit (AMBULATORY_SURGERY_CENTER): Payer: Medicare Other

## 2012-02-10 VITALS — Ht 65.0 in | Wt 144.9 lb

## 2012-02-10 DIAGNOSIS — Z86010 Personal history of colonic polyps: Secondary | ICD-10-CM

## 2012-02-10 DIAGNOSIS — Z1211 Encounter for screening for malignant neoplasm of colon: Secondary | ICD-10-CM

## 2012-02-10 MED ORDER — MOVIPREP 100 G PO SOLR: ORAL | Status: DC

## 2012-02-10 NOTE — Progress Notes (Signed)
Pt was reminded to bring her inhaler (proair) to her appt on 02/24/12. Ulis Rias RN

## 2012-02-24 ENCOUNTER — Encounter: Payer: Self-pay | Admitting: Gastroenterology

## 2012-02-24 ENCOUNTER — Ambulatory Visit (AMBULATORY_SURGERY_CENTER): Payer: Medicare Other | Admitting: Gastroenterology

## 2012-02-24 ENCOUNTER — Other Ambulatory Visit: Payer: Self-pay | Admitting: Gastroenterology

## 2012-02-24 VITALS — BP 161/79 | HR 83 | Temp 96.0°F | Resp 16 | Ht 65.0 in | Wt 144.0 lb

## 2012-02-24 DIAGNOSIS — D126 Benign neoplasm of colon, unspecified: Secondary | ICD-10-CM | POA: Diagnosis not present

## 2012-02-24 DIAGNOSIS — Z86010 Personal history of colonic polyps: Secondary | ICD-10-CM | POA: Diagnosis not present

## 2012-02-24 DIAGNOSIS — Z1211 Encounter for screening for malignant neoplasm of colon: Secondary | ICD-10-CM

## 2012-02-24 MED ORDER — SODIUM CHLORIDE 0.9 % IV SOLN: 500.0000 mL | INTRAVENOUS | Status: DC

## 2012-02-24 NOTE — Patient Instructions (Addendum)
YOU HAD AN ENDOSCOPIC PROCEDURE TODAY AT THE [facility]: Refer to the procedure report that was given to you for any specific questions about what was found during the examination.  If the procedure report does not answer your questions, please call your gastroenterologist to clarify.  If you requested that your care partner not be given the details of your procedure findings, then the procedure report has been included in a sealed envelope for you to review at your convenience later.  YOU SHOULD EXPECT: Some feelings of bloating in the abdomen. Passage of more gas than usual.  Walking can help get rid of the air that was put into your GI tract during the procedure and reduce the bloating. If you had a lower endoscopy (such as a colonoscopy or flexible sigmoidoscopy) you may notice spotting of blood in your stool or on the toilet paper. If you underwent a bowel prep for your procedure, then you may not have a normal bowel movement for a few days.  DIET: Your first meal following the procedure should be a light meal and then it is ok to progress to your normal diet.  A half-sandwich or bowl of soup is an example of a good first meal.  Heavy or fried foods are harder to digest and may make you feel nauseous or bloated.  Likewise meals heavy in dairy and vegetables can cause extra gas to form and this can also increase the bloating.  Drink plenty of fluids but you should avoid alcoholic beverages for 24 hours.  ACTIVITY: Your care partner should take you home directly after the procedure.  You should plan to take it easy, moving slowly for the rest of the day.  You can resume normal activity the day after the procedure however you should NOT DRIVE or use heavy machinery for 24 hours (because of the sedation medicines used during the test).    SYMPTOMS TO REPORT IMMEDIATELY: A gastroenterologist can be reached at any hour.  During normal business hours, 8:30 AM to 5:00 PM Monday through Friday,  call [phone].  After hours and on weekends, please call the GI answering service at [phone] who will take a message and have the physician on call contact you.   Following lower endoscopy (colonoscopy or flexible sigmoidoscopy):  Excessive amounts of blood in the stool  Significant tenderness or worsening of abdominal pains  Swelling of the abdomen that is new, acute  Fever of 100F or higher  FOLLOW UP: If any biopsies were taken you will be contacted by phone or by letter within the next 1-3 weeks.  Call your gastroenterologist if you have not heard about the biopsies in 3 weeks.  Our staff will call the home number listed on your records the next business day following your procedure to check on you and address any questions or concerns that you may have at that time regarding the information given to you following your procedure. This is a courtesy call and so if there is no answer at the home number and we have not heard from you through the emergency physician on call, we will assume that you have returned to your regular daily activities without incident.  SIGNATURES/CONFIDENTIALITY: You and/or your care partner have signed paperwork which will be entered into your electronic medical record.  These signatures attest to the fact that that the information above on your After Visit Summary has been reviewed and is understood.  Full responsibility of the confidentiality of this   discharge information lies with you and/or your care-partner.    Resume medications. Information given on polyps with discharge instructions. 

## 2012-02-24 NOTE — Progress Notes (Signed)
Inhaler at bedside with patient

## 2012-02-24 NOTE — Op Note (Signed)
[facility] [address] [place], [place]  [address]  COLONOSCOPY PROCEDURE REPORT  PATIENT:  Krystal [name], Krystal [name]  MR#:  [id number] BIRTHDATE:  08/24/1940, 70 yrs. old  GENDER:  female ENDOSCOPIST:  Rachael Fee, MD PROCEDURE DATE:  02/24/2012 PROCEDURE:  Colonoscopy with biopsy and snare polypectomy ASA CLASS:  Class II INDICATIONS:  tubular adenoma 2008 MEDICATIONS:   Fentanyl 25 mcg IV, These medications were titrated to patient response per physician's verbal order, Versed 2 mg IV  DESCRIPTION OF PROCEDURE:   After the risks benefits and alternatives of the procedure were thoroughly explained, informed consent was obtained.  Digital rectal exam was performed and revealed no rectal masses.   The LB CF-[id number] endoscope was introduced through the anus and advanced to the cecum, which was identified by both the appendix and ileocecal valve, without limitations.  The quality of the prep was good..  The instrument was then slowly withdrawn as the colon was fully examined. <<PROCEDUREIMAGES>> FINDINGS:  Two sessile polyps were found, removed. One was 2mm across, located in ascending segment, removed with biopsy forceps, sent to pathology (jar 1). The other was 5mm across, located in descending segment, removed with cold snare, not retrieved (see image3 and image4).  This was otherwise a normal examination of the colon (see image1 and image7).   Retroflexed views in the rectum revealed no abnormalities. COMPLICATIONS:  None  ENDOSCOPIC IMPRESSION: 1) Two small polyps were found, removed; one was retrieved and sent to pathology 2) Otherwise normal examination  RECOMMENDATIONS: 1) Given your personal history of adenomatous (pre-cancerous) polyps, you will need a repeat colonoscopy in 5 years even if the polyps removed today are NOT precancerous.  ______________________________ Rachael Fee, MD  n. eSIGNED:   Rachael Fee at 02/24/2012 10:45  AM  Molli Barrows, [id number]

## 2012-02-24 NOTE — Progress Notes (Signed)
Patient did not experience any of the following events: a burn prior to discharge; a fall within the facility; wrong site/side/patient/procedure/implant event; or a hospital transfer or hospital admission upon discharge from the facility. (G8907) Patient did not have preoperative order for IV antibiotic SSI prophylaxis. (G8918)  

## 2012-02-25 ENCOUNTER — Telehealth: Payer: Self-pay | Admitting: *Deleted

## 2012-02-25 NOTE — Telephone Encounter (Signed)
  Follow up Call-  Call back number 02/24/2012  Post procedure Call Back phone  # [phone]  Permission to leave phone message Yes     Patient questions:  Do you have a fever, pain , or abdominal swelling? no Pain Score  0 *  Have you tolerated food without any problems? yes  Have you been able to return to your normal activities? yes  Do you have any questions about your discharge instructions: Diet   no Medications  no Follow up visit  no  Do you have questions or concerns about your Care? no  Actions: * If pain score is 4 or above: No action needed, pain <4.

## 2012-03-02 ENCOUNTER — Encounter: Payer: Self-pay | Admitting: Gastroenterology

## 2012-06-30 DIAGNOSIS — R070 Pain in throat: Secondary | ICD-10-CM | POA: Diagnosis not present

## 2012-06-30 DIAGNOSIS — J029 Acute pharyngitis, unspecified: Secondary | ICD-10-CM | POA: Diagnosis not present

## 2012-06-30 DIAGNOSIS — J441 Chronic obstructive pulmonary disease with (acute) exacerbation: Secondary | ICD-10-CM | POA: Diagnosis not present

## 2012-06-30 DIAGNOSIS — E785 Hyperlipidemia, unspecified: Secondary | ICD-10-CM | POA: Diagnosis not present

## 2012-07-06 DIAGNOSIS — R5381 Other malaise: Secondary | ICD-10-CM | POA: Diagnosis not present

## 2012-07-06 DIAGNOSIS — E559 Vitamin D deficiency, unspecified: Secondary | ICD-10-CM | POA: Diagnosis not present

## 2012-07-06 DIAGNOSIS — I10 Essential (primary) hypertension: Secondary | ICD-10-CM | POA: Diagnosis not present

## 2012-07-06 DIAGNOSIS — E785 Hyperlipidemia, unspecified: Secondary | ICD-10-CM | POA: Diagnosis not present

## 2012-10-15 DIAGNOSIS — R1011 Right upper quadrant pain: Secondary | ICD-10-CM | POA: Diagnosis not present

## 2012-10-15 DIAGNOSIS — R109 Unspecified abdominal pain: Secondary | ICD-10-CM | POA: Diagnosis not present

## 2012-10-19 ENCOUNTER — Other Ambulatory Visit: Payer: Self-pay | Admitting: Family Medicine

## 2012-10-19 DIAGNOSIS — R52 Pain, unspecified: Secondary | ICD-10-CM

## 2012-10-21 ENCOUNTER — Other Ambulatory Visit: Payer: Medicare Other

## 2012-10-21 ENCOUNTER — Ambulatory Visit
Admission: RE | Admit: 2012-10-21 | Discharge: 2012-10-21 | Disposition: A | Discharge status: Home / Self Care | Payer: Medicare Other | Source: Ambulatory Visit | Attending: Family Medicine | Admitting: Family Medicine

## 2012-10-21 DIAGNOSIS — R52 Pain, unspecified: Secondary | ICD-10-CM

## 2012-10-21 DIAGNOSIS — R1011 Right upper quadrant pain: Secondary | ICD-10-CM | POA: Diagnosis not present

## 2012-10-22 ENCOUNTER — Telehealth: Payer: Self-pay | Admitting: *Deleted

## 2012-10-23 ENCOUNTER — Other Ambulatory Visit: Payer: Self-pay | Admitting: Family Medicine

## 2012-10-27 ENCOUNTER — Telehealth: Payer: Self-pay | Admitting: Family Medicine

## 2012-10-27 NOTE — Telephone Encounter (Signed)
Spoke with patient and notified her of neg ultrasound.

## 2012-10-27 NOTE — Telephone Encounter (Signed)
Patient was looking for the results of the testing that she had at [facility]. Please call with results

## 2012-10-31 ENCOUNTER — Other Ambulatory Visit: Payer: Self-pay | Admitting: Family Medicine

## 2012-11-04 ENCOUNTER — Ambulatory Visit (INDEPENDENT_AMBULATORY_CARE_PROVIDER_SITE_OTHER): Payer: Medicare Other | Admitting: Family Medicine

## 2012-11-04 ENCOUNTER — Encounter: Payer: Self-pay | Admitting: Family Medicine

## 2012-11-04 VITALS — BP 126/72 | HR 91 | Temp 97.6°F | Ht 63.25 in | Wt 145.0 lb

## 2012-11-04 DIAGNOSIS — R5381 Other malaise: Secondary | ICD-10-CM

## 2012-11-04 DIAGNOSIS — K449 Diaphragmatic hernia without obstruction or gangrene: Secondary | ICD-10-CM

## 2012-11-04 DIAGNOSIS — Z860100 Personal history of colon polyps, unspecified: Secondary | ICD-10-CM

## 2012-11-04 DIAGNOSIS — E785 Hyperlipidemia, unspecified: Secondary | ICD-10-CM

## 2012-11-04 DIAGNOSIS — J4489 Other specified chronic obstructive pulmonary disease: Secondary | ICD-10-CM

## 2012-11-04 DIAGNOSIS — C50919 Malignant neoplasm of unspecified site of unspecified female breast: Secondary | ICD-10-CM

## 2012-11-04 DIAGNOSIS — J449 Chronic obstructive pulmonary disease, unspecified: Secondary | ICD-10-CM

## 2012-11-04 DIAGNOSIS — C50911 Malignant neoplasm of unspecified site of right female breast: Secondary | ICD-10-CM

## 2012-11-04 DIAGNOSIS — K219 Gastro-esophageal reflux disease without esophagitis: Secondary | ICD-10-CM

## 2012-11-04 NOTE — Progress Notes (Signed)
  Subjective:    Patient ID: Krystal [name], female    DOB: 1941-03-06, 72 y.o.   MRN: [id number]  HPI This patient presents for recheck of multiple medical problems. No one accompanies the patient today.  Patient Active Problem List  Diagnosis  . NEOPLASM, MALIGNANT, BREAST  . ADENOMATOUS COLONIC POLYP  . HYPERLIPIDEMIA  . ANXIETY  . DEPRESSION  . ASTHMA, UNSPECIFIED, UNSPECIFIED STATUS  . GERD  . GASTRITIS, WITH HEMORRHAGE  . DUODENITIS, WITHOUT HEMORRHAGE  . HIATAL HERNIA    In addition, see review of systems  The allergies, current medications, past medical history, surgical history, family and social history are reviewed.  Immunizations reviewed.  Health maintenance reviewed.  The following items are outstanding: Patient  refuses immunizations      Review of Systems  HENT: Positive for sneezing (light).   Eyes: Positive for itching (dryness).  Respiratory: Negative.   Cardiovascular: Negative.   Gastrointestinal: Positive for abdominal pain (R mid quad, mostly at night) and constipation. Negative for vomiting and diarrhea.  Genitourinary: Negative.   Musculoskeletal: Positive for myalgias (L thigh) and arthralgias (R knee R hip).  Neurological: Negative.   Psychiatric/Behavioral: Positive for sleep disturbance (interrupted due to taking care of elderly mother).       Objective:   Physical Exam        Assessment & Plan:

## 2012-11-04 NOTE — Patient Instructions (Addendum)
FOBT given  Fall precautions discussed Continue current meds and therapeutic lifestyle changes  Mammogram will be scheduled here at the earliest available slot  Try Zyrtec or Allegra for allergy Also Nasacort AQ one spray each nostril at bedtime

## 2012-11-30 ENCOUNTER — Other Ambulatory Visit: Payer: Self-pay | Admitting: *Deleted

## 2012-11-30 MED ORDER — DIAZEPAM 5 MG PO TABS: 5.0000 mg | ORAL_TABLET | Freq: Four times a day (QID) | ORAL | Status: DC | PRN

## 2012-11-30 NOTE — Telephone Encounter (Signed)
See note

## 2012-11-30 NOTE — Telephone Encounter (Signed)
Patient last seen on 11-04-12. Last filled rx on 10-31-12.Please advise. If approved please have nurse phone in to [facility] pharmacy. [phone]. Thank you

## 2012-12-01 NOTE — Telephone Encounter (Signed)
Called to [facility] pharmacy

## 2012-12-24 ENCOUNTER — Telehealth: Payer: Self-pay | Admitting: Nurse Practitioner

## 2012-12-24 ENCOUNTER — Telehealth: Payer: Self-pay | Admitting: Family Medicine

## 2012-12-24 ENCOUNTER — Other Ambulatory Visit: Payer: Self-pay | Admitting: Nurse Practitioner

## 2012-12-24 MED ORDER — AZITHROMYCIN 250 MG PO TABS: ORAL_TABLET | ORAL | Status: DC

## 2012-12-24 NOTE — Telephone Encounter (Signed)
Mae - can we call in something for this pt- allergies and meds are correct in epic. [facility].

## 2012-12-24 NOTE — Telephone Encounter (Signed)
error 

## 2012-12-24 NOTE — Telephone Encounter (Signed)
Yes, may call zyrtec 10mg  in daily.

## 2012-12-24 NOTE — Telephone Encounter (Signed)
Please advise 

## 2012-12-24 NOTE — Telephone Encounter (Signed)
Has a sinus problem and wants an antibiotic please.

## 2012-12-29 ENCOUNTER — Other Ambulatory Visit: Payer: Self-pay | Admitting: Family Medicine

## 2013-01-25 ENCOUNTER — Other Ambulatory Visit: Payer: Self-pay | Admitting: Nurse Practitioner

## 2013-01-27 NOTE — Telephone Encounter (Signed)
Last seen 11/04/12, last filled 10/31/12

## 2013-02-21 ENCOUNTER — Other Ambulatory Visit: Payer: Self-pay | Admitting: Family Medicine

## 2013-02-23 ENCOUNTER — Other Ambulatory Visit: Payer: Self-pay | Admitting: *Deleted

## 2013-02-23 MED ORDER — DIAZEPAM 5 MG PO TABS: 5.0000 mg | ORAL_TABLET | Freq: Four times a day (QID) | ORAL | Status: DC | PRN

## 2013-02-23 NOTE — Telephone Encounter (Signed)
RX for Valium called into [facility] Pt notified 

## 2013-02-23 NOTE — Telephone Encounter (Signed)
RX for Valium called into [facility] Pt notified

## 2013-02-23 NOTE — Telephone Encounter (Signed)
LAST RF 01/25/13. LAST OV 4/14. CALL IN [facility] IF APPROVED

## 2013-03-04 ENCOUNTER — Other Ambulatory Visit (INDEPENDENT_AMBULATORY_CARE_PROVIDER_SITE_OTHER): Payer: Medicare Other

## 2013-03-04 DIAGNOSIS — R5381 Other malaise: Secondary | ICD-10-CM | POA: Diagnosis not present

## 2013-03-04 DIAGNOSIS — R5383 Other fatigue: Secondary | ICD-10-CM | POA: Diagnosis not present

## 2013-03-04 DIAGNOSIS — E785 Hyperlipidemia, unspecified: Secondary | ICD-10-CM | POA: Diagnosis not present

## 2013-03-04 LAB — THYROID PANEL WITH TSH
Free Thyroxine Index: 3.4 (ref 1.0–3.9)
T3 Uptake: 38.5 % — ABNORMAL HIGH (ref 22.5–37.0)

## 2013-03-04 LAB — POCT CBC
Hemoglobin: 14.1 g/dL (ref 12.2–16.2)
MPV: 7.9 fL (ref 0–99.8)
POC Granulocyte: 3.2 (ref 2–6.9)
RBC: 4.8 M/uL (ref 4.04–5.48)

## 2013-03-04 LAB — HEPATIC FUNCTION PANEL
Bilirubin, Direct: 0.1 mg/dL (ref 0.0–0.3)
Total Bilirubin: 0.6 mg/dL (ref 0.3–1.2)

## 2013-03-04 LAB — BASIC METABOLIC PANEL WITH GFR
CO2: 28 mEq/L (ref 19–32)
GFR, Est African American: >89 mL/min
Glucose, Bld: 103 mg/dL — ABNORMAL HIGH (ref 70–99)
Potassium: 4.2 mEq/L (ref 3.5–5.3)
Sodium: 138 mEq/L (ref 135–145)

## 2013-03-04 NOTE — Progress Notes (Signed)
Pt came in for labs only 

## 2013-03-07 LAB — NMR LIPOPROFILE WITH LIPIDS
Cholesterol, Total: 152 mg/dL (ref <200)
HDL Size: 9.7 nm (ref >=9.2)
LDL (calc): 71 mg/dL (ref <100)
LDL Particle Number: 759 nmol/L (ref <1000)
LP-IR Score: 26 (ref <=45)
Small LDL Particle Number: 410 nmol/L (ref <=527)
VLDL Size: 41.6 nm (ref <=46.6)

## 2013-03-08 ENCOUNTER — Telehealth: Payer: Self-pay | Admitting: *Deleted

## 2013-03-08 NOTE — Telephone Encounter (Signed)
Message copied by Bearl Mulberry on Tue Mar 08, 2013  6:11 PM ------      Message from: Ernestina Penna      Created: Mon Mar 07, 2013  1:27 PM       The thyroid tests are within normal limits.      Liver function tests are within normal limits      Electrolytes and kidney function tests are within normal limits. Blood sugar is slightly increased at 103.      Vitamin D is good at 64      All cholesterol numbers by advanced lipid testing are excellent and within normal limits-continue current treatment and aggressive therapeutic lifestyle changes ------

## 2013-03-08 NOTE — Telephone Encounter (Signed)
Pt notified of results

## 2013-03-09 ENCOUNTER — Encounter: Payer: Self-pay | Admitting: Family Medicine

## 2013-03-09 ENCOUNTER — Ambulatory Visit (INDEPENDENT_AMBULATORY_CARE_PROVIDER_SITE_OTHER): Payer: Medicare Other | Admitting: Family Medicine

## 2013-03-09 VITALS — BP 90/48 | HR 102 | Temp 98.1°F | Ht 63.0 in | Wt 145.0 lb

## 2013-03-09 DIAGNOSIS — K219 Gastro-esophageal reflux disease without esophagitis: Secondary | ICD-10-CM

## 2013-03-09 DIAGNOSIS — C50919 Malignant neoplasm of unspecified site of unspecified female breast: Secondary | ICD-10-CM | POA: Diagnosis not present

## 2013-03-09 DIAGNOSIS — E785 Hyperlipidemia, unspecified: Secondary | ICD-10-CM

## 2013-03-09 DIAGNOSIS — F3289 Other specified depressive episodes: Secondary | ICD-10-CM | POA: Diagnosis not present

## 2013-03-09 DIAGNOSIS — F329 Major depressive disorder, single episode, unspecified: Secondary | ICD-10-CM | POA: Diagnosis not present

## 2013-03-09 DIAGNOSIS — F411 Generalized anxiety disorder: Secondary | ICD-10-CM

## 2013-03-09 NOTE — Progress Notes (Signed)
  Subjective:    Patient ID: Krystal [name], female    DOB: 17-Mar-1941, 72 y.o.   MRN: [id number]  HPI Patient comes in today for followup and management of chronic medical problems. These problems include hyperlipidemia, GERD, COPD, and osteoporosis. She also has a history of colon polyps and a remote history of breast cancer. She has seasonal allergic rhinitis. She also has a history of a brain tumor in 1986 and hysterectomy secondary to dysfunctional uterine bleeding in the 1970s. 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 Her mom has been in the hospital a couple times since May.   Review of Systems  Constitutional: Positive for fatigue (slight).  HENT: Positive for congestion (slight) and postnasal drip. Negative for ear pain, sore throat and sinus pressure.   Eyes: Negative.  Negative for photophobia, pain, discharge, redness, itching and visual disturbance.  Respiratory: Negative.  Negative for cough, choking, chest tightness and wheezing.   Cardiovascular: Positive for leg swelling (intermitent). Negative for chest pain and palpitations.  Gastrointestinal: Positive for nausea (intermitent), abdominal pain and constipation. Negative for vomiting, diarrhea, blood in stool, anal bleeding and rectal pain.  Endocrine: Negative for cold intolerance, heat intolerance, polydipsia, polyphagia and polyuria.  Genitourinary: Negative.  Negative for dysuria, urgency, frequency, hematuria, vaginal bleeding, vaginal discharge and vaginal pain.  Musculoskeletal: Positive for arthralgias (R knee and ankle). Negative for myalgias, back pain, joint swelling and gait problem.  Skin: Negative.  Negative for color change, pallor, rash and wound.  Allergic/Immunologic: Positive for environmental allergies (seasonal).  Neurological: Positive for headaches. Negative for dizziness, tremors, syncope, weakness, light-headedness and numbness.   Psychiatric/Behavioral: Positive for sleep disturbance (occasional). Negative for confusion. The patient is not nervous/anxious.        Objective:   Physical Exam BP 90/48  Pulse 102  Temp(Src) 98.1 F (36.7 C) (Oral)  Ht 5\' 3"  (1.6 m)  Wt 145 lb (65.772 kg)  BMI 25.69 kg/m2  The patient appeared well nourished and normally developed, alert and oriented to time and place. Speech, behavior and judgement appear normal. Vital signs as documented.  Head exam is unremarkable. No scleral icterus or pallor noted. Ears nose and throat were within normal limits/. She does wear dentures.  Neck is without jugular venous distension, thyromegally, or carotid bruits. Carotid upstrokes are brisk bilaterally. No cervical adenopathy. Lungs are clear anteriorly and posteriorly to auscultation. Normal respiratory effort. She has had a right mastectomy. Cardiac exam reveals regular rate and rhythm at 84 per minute. First and second heart sounds normal.  No murmurs, rubs or gallops.  Abdominal exam reveals normal bowl sounds, no masses, no organomegaly and no aortic enlargement. No inguinal adenopathy. Extremities are nonedematous and both femoral and pedal pulses are normal. Skin without pallor or jaundice.  Warm and dry, without rash. Neurologic exam reveals normal deep tendon reflexes and normal sensation.  Recent labs which were all excellent were reviewed with patient today       Assessment & Plan:  HYPERLIPIDEMIA  NEOPLASM, MALIGNANT, BREAST  GERD  DEPRESSION  ANXIETY   Patient Instructions  Fall precautions discussed Continue current meds and therapeutic lifestyle changes Return FOBT Will schedule patient for a mammogram this month   Nyra Capes MD

## 2013-03-09 NOTE — Patient Instructions (Addendum)
Fall precautions discussed Continue current meds and therapeutic lifestyle changes Return FOBT Will schedule patient for a mammogram this month

## 2013-03-21 ENCOUNTER — Encounter: Payer: Self-pay | Admitting: *Deleted

## 2013-03-28 DIAGNOSIS — Z1231 Encounter for screening mammogram for malignant neoplasm of breast: Secondary | ICD-10-CM | POA: Diagnosis not present

## 2013-04-06 ENCOUNTER — Other Ambulatory Visit: Payer: Self-pay | Admitting: *Deleted

## 2013-04-06 MED ORDER — TRAMADOL HCL 50 MG PO TABS: ORAL_TABLET | ORAL | Status: DC

## 2013-04-06 NOTE — Telephone Encounter (Signed)
LAST OV 03/09/13. LAST RF 03/06/13. PRINT AND CALL PT. THANKS.

## 2013-04-09 NOTE — Telephone Encounter (Signed)
Called into [facility] 

## 2013-04-12 DIAGNOSIS — H43819 Vitreous degeneration, unspecified eye: Secondary | ICD-10-CM | POA: Diagnosis not present

## 2013-04-12 DIAGNOSIS — H353190 Nonexudative age-related macular degeneration, unspecified eye, stage unspecified: Secondary | ICD-10-CM | POA: Diagnosis not present

## 2013-04-12 DIAGNOSIS — H5200 Hypermetropia, unspecified eye: Secondary | ICD-10-CM | POA: Diagnosis not present

## 2013-04-12 DIAGNOSIS — H52229 Regular astigmatism, unspecified eye: Secondary | ICD-10-CM | POA: Diagnosis not present

## 2013-04-19 DIAGNOSIS — H353190 Nonexudative age-related macular degeneration, unspecified eye, stage unspecified: Secondary | ICD-10-CM | POA: Diagnosis not present

## 2013-04-19 DIAGNOSIS — H3560 Retinal hemorrhage, unspecified eye: Secondary | ICD-10-CM | POA: Diagnosis not present

## 2013-04-19 DIAGNOSIS — H353290 Exudative age-related macular degeneration, unspecified eye, stage unspecified: Secondary | ICD-10-CM | POA: Diagnosis not present

## 2013-04-19 DIAGNOSIS — Z961 Presence of intraocular lens: Secondary | ICD-10-CM | POA: Diagnosis not present

## 2013-04-29 ENCOUNTER — Other Ambulatory Visit: Payer: Self-pay | Admitting: Family Medicine

## 2013-05-02 ENCOUNTER — Other Ambulatory Visit: Payer: Self-pay

## 2013-05-02 DIAGNOSIS — H43819 Vitreous degeneration, unspecified eye: Secondary | ICD-10-CM | POA: Diagnosis not present

## 2013-05-02 DIAGNOSIS — H353190 Nonexudative age-related macular degeneration, unspecified eye, stage unspecified: Secondary | ICD-10-CM | POA: Diagnosis not present

## 2013-05-02 DIAGNOSIS — Z961 Presence of intraocular lens: Secondary | ICD-10-CM | POA: Diagnosis not present

## 2013-05-02 MED ORDER — DIAZEPAM 5 MG PO TABS: 5.0000 mg | ORAL_TABLET | Freq: Four times a day (QID) | ORAL | Status: DC | PRN

## 2013-05-02 NOTE — Telephone Encounter (Signed)
Med called to pharm 

## 2013-05-02 NOTE — Telephone Encounter (Signed)
Last seen 03/09/13  [facility]  If approved route to nurse to phone into [facility]

## 2013-05-02 NOTE — Telephone Encounter (Signed)
This prescription is okay for 6 month 

## 2013-05-03 DIAGNOSIS — R928 Other abnormal and inconclusive findings on diagnostic imaging of breast: Secondary | ICD-10-CM | POA: Diagnosis not present

## 2013-05-03 DIAGNOSIS — R922 Inconclusive mammogram: Secondary | ICD-10-CM | POA: Diagnosis not present

## 2013-05-29 ENCOUNTER — Other Ambulatory Visit: Payer: Self-pay | Admitting: Family Medicine

## 2013-06-01 ENCOUNTER — Other Ambulatory Visit: Payer: Self-pay | Admitting: Family Medicine

## 2013-06-03 ENCOUNTER — Other Ambulatory Visit: Payer: Self-pay | Admitting: *Deleted

## 2013-06-03 MED ORDER — TRAMADOL HCL 50 MG PO TABS: ORAL_TABLET | ORAL | Status: DC

## 2013-06-03 NOTE — Telephone Encounter (Signed)
Last filled 04/29/13, last seen 03/09/13. Rx will print

## 2013-06-09 DIAGNOSIS — H5710 Ocular pain, unspecified eye: Secondary | ICD-10-CM | POA: Diagnosis not present

## 2013-06-09 DIAGNOSIS — H353190 Nonexudative age-related macular degeneration, unspecified eye, stage unspecified: Secondary | ICD-10-CM | POA: Diagnosis not present

## 2013-06-09 DIAGNOSIS — Z961 Presence of intraocular lens: Secondary | ICD-10-CM | POA: Diagnosis not present

## 2013-06-09 DIAGNOSIS — H43819 Vitreous degeneration, unspecified eye: Secondary | ICD-10-CM | POA: Diagnosis not present

## 2013-06-21 ENCOUNTER — Encounter: Payer: Self-pay | Admitting: Family Medicine

## 2013-06-21 ENCOUNTER — Ambulatory Visit (INDEPENDENT_AMBULATORY_CARE_PROVIDER_SITE_OTHER): Payer: Medicare Other | Admitting: Family Medicine

## 2013-06-21 VITALS — BP 128/74 | HR 83 | Temp 97.2°F | Ht 63.0 in | Wt 153.0 lb

## 2013-06-21 DIAGNOSIS — F329 Major depressive disorder, single episode, unspecified: Secondary | ICD-10-CM | POA: Diagnosis not present

## 2013-06-21 DIAGNOSIS — E785 Hyperlipidemia, unspecified: Secondary | ICD-10-CM

## 2013-06-21 DIAGNOSIS — M545 Low back pain, unspecified: Secondary | ICD-10-CM

## 2013-06-21 DIAGNOSIS — F3289 Other specified depressive episodes: Secondary | ICD-10-CM | POA: Diagnosis not present

## 2013-06-21 DIAGNOSIS — K219 Gastro-esophageal reflux disease without esophagitis: Secondary | ICD-10-CM | POA: Diagnosis not present

## 2013-06-21 DIAGNOSIS — Z780 Asymptomatic menopausal state: Secondary | ICD-10-CM

## 2013-06-21 DIAGNOSIS — Z23 Encounter for immunization: Secondary | ICD-10-CM | POA: Diagnosis not present

## 2013-06-21 DIAGNOSIS — E559 Vitamin D deficiency, unspecified: Secondary | ICD-10-CM

## 2013-06-21 DIAGNOSIS — G8929 Other chronic pain: Secondary | ICD-10-CM

## 2013-06-21 DIAGNOSIS — D509 Iron deficiency anemia, unspecified: Secondary | ICD-10-CM

## 2013-06-21 DIAGNOSIS — J309 Allergic rhinitis, unspecified: Secondary | ICD-10-CM

## 2013-06-21 LAB — POCT CBC
Granulocyte percent: 66.4 %G (ref 37–80)
HCT, POC: 42.5 % (ref 37.7–47.9)
Lymph, poc: 1.6 (ref 0.6–3.4)
MCH, POC: 28.6 pg (ref 27–31.2)
MCHC: 32.3 g/dL (ref 31.8–35.4)
MCV: 88.7 fL (ref 80–97)
MPV: 8.3 fL (ref 0–99.8)
POC Granulocyte: 3.6 (ref 2–6.9)
POC LYMPH PERCENT: 30 %L (ref 10–50)
Platelet Count, POC: 144 10*3/uL (ref 142–424)
RDW, POC: 13.2 %
WBC: 5.4 10*3/uL (ref 4.6–10.2)

## 2013-06-21 NOTE — Progress Notes (Signed)
Subjective:    Patient ID: Krystal [name], female    DOB: Oct 31, 1940, 71 y.o.   MRN: [id number]  HPI Pt here for follow up and management of chronic medical problems. 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        Patient Active Problem List   Diagnosis Date Noted  . NEOPLASM, MALIGNANT, BREAST 11/22/2007  . HYPERLIPIDEMIA 11/22/2007  . ANXIETY 11/22/2007  . DEPRESSION 11/22/2007  . ASTHMA, UNSPECIFIED, UNSPECIFIED STATUS 11/22/2007  . GERD 11/22/2007  . GASTRITIS, WITH HEMORRHAGE 08/14/2007  . DUODENITIS, WITHOUT HEMORRHAGE 08/14/2007  . HIATAL HERNIA 08/14/2007  . 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   Outpatient Encounter Prescriptions as of 06/21/2013  Medication Sig  . aspirin 81 MG tablet Take 81 mg by mouth daily.  . cetirizine (ZYRTEC) 10 MG tablet Take 10 mg by mouth as needed.  . cholecalciferol (VITAMIN D) 1000 UNITS tablet Take by mouth daily. Take 5,000 units daily  . diazepam (VALIUM) 5 MG tablet Take 1 tablet (5 mg total) by mouth every 6 (six) hours as needed.  . ferrous sulfate 325 (65 FE) MG EC tablet Take 325 mg by mouth daily.  . fish oil-omega-3 fatty acids 1000 MG capsule Take 1 g by mouth daily.  . furosemide (LASIX) 20 MG tablet Take 20 mg by mouth as needed.  . montelukast (SINGULAIR) 10 MG tablet TAKE ONE TABLET BY MOUTH AT BEDTIME  . Multiple Vitamins-Minerals (PRESERVISION/LUTEIN PO) Take 2 capsules by mouth daily.  Marland Kitchen NEXIUM 40 MG capsule TAKE 1 CAPSULE BY MOUTH ONCE  A DAY  . PROAIR HFA 108 (90 BASE) MCG/ACT inhaler INHALE TWO PUFFS BY MOUTH 4 TIMES DAILY AS NEEDED  . simvastatin (ZOCOR) 20 MG tablet Take 20 mg by mouth every evening.  . theophylline (THEO-24) 200 MG 24 hr capsule Take 200 mg by mouth daily.  . traMADol (ULTRAM) 50 MG tablet TAKE ONE TABLET BY MOUTH TWICE DAILY AS NEEDED  . [DISCONTINUED] PROAIR HFA 108 (90 BASE)  MCG/ACT inhaler INHALE TWO PUFFS BY MOUTH 4 TIMES DAILY AS NEEDED    Review of Systems  Constitutional: Negative.   HENT: Negative.   Eyes: Negative.   Respiratory: Negative.   Cardiovascular: Negative.   Gastrointestinal: Negative.   Endocrine: Negative.   Genitourinary: Negative.   Musculoskeletal: Positive for arthralgias (left thigh pain).  Skin: Negative.   Allergic/Immunologic: Negative.   Neurological: Negative.   Hematological: Negative.   Psychiatric/Behavioral: Negative.        Objective:   Physical Exam  Nursing note and vitals reviewed. Constitutional: She is oriented to person, place, and time. She appears well-developed and well-nourished. No distress.  Kyphotic  HENT:  Head: Normocephalic and atraumatic.  Right Ear: External ear normal.  Left Ear: External ear normal.  Nose: Nose normal.  Mouth/Throat: Oropharynx is clear and moist. No oropharyngeal exudate.  Eyes: Conjunctivae and EOM are normal. Pupils are equal, round, and reactive to light. Right eye exhibits no discharge. Left eye exhibits no discharge. No scleral icterus.  Neck: Normal range of motion. Neck supple. No JVD present. No thyromegaly present.  Cardiovascular: Normal rate, regular rhythm, normal heart sounds and intact distal pulses.  Exam reveals no gallop and no friction rub.   No murmur heard. At 72 per minute  Pulmonary/Chest: Effort normal and breath sounds normal. No respiratory distress. She has no wheezes. She has no rales. She exhibits  no tenderness.  Abdominal: Soft. Bowel sounds are normal. She exhibits no mass. There is tenderness. There is no rebound and no guarding.  Right upper and right quadrant tenderness, no inguinal adenopathy  Genitourinary:  Patient has had a right mastectomy. The left breast was checked and there was no indication of any mass. The axillary regions in both breasts were negative  Musculoskeletal: Normal range of motion. She exhibits no edema and no  tenderness.  Lymphadenopathy:    She has no cervical adenopathy.  Neurological: She is alert and oriented to person, place, and time. She has normal reflexes. No cranial nerve deficit.  Skin: Skin is warm and dry.  Psychiatric: She has a normal mood and affect. Her behavior is normal. Judgment and thought content normal.   BP 128/74  Pulse 83  Temp(Src) 97.2 F (36.2 C) (Oral)  Ht 5\' 3"  (1.6 m)  Wt 153 lb (69.4 kg)  BMI 27.11 kg/m2        Assessment & Plan:  1. GERD - POCT CBC  2. HYPERLIPIDEMIA - POCT CBC - BMP8+EGFR - Hepatic function panel - NMR, lipoprofile  3. DEPRESSION - POCT CBC  4. Postmenopausal - DG Bone Density; Future - POCT CBC  5. Vitamin D deficiency - Vit D  25 hydroxy (rtn osteoporosis monitoring)  6. Anemia, iron deficiency -Continue iron -Return FOBT  7. Allergic rhinitis  8. Chronic low back pain  9. Right mastectomy secondary to breast cancer -Check left breast regularly -Mammogram next September -Order placed for prosthesis for right breast(Bra)   Patient Instructions  Continue current medications. Continue good therapeutic lifestyle changes which include good diet and exercise. Fall precautions discussed with patient. If you are over 64 years old - you may need Prevnar 13 or the adult Pneumonia vaccine. Bring back FOBT Continue to check left breast regularly Mammogram will be due again next September  Breast prosthesis order placed at [facility]  Nyra Capes MD

## 2013-06-21 NOTE — Addendum Note (Signed)
Addended by: Azalee Course on: 06/21/2013 03:07 PM   Modules accepted: Orders

## 2013-06-21 NOTE — Patient Instructions (Addendum)
Continue current medications. Continue good therapeutic lifestyle changes which include good diet and exercise. Fall precautions discussed with patient. If you are over 74 years old - you may need Prevnar 13 or the adult Pneumonia vaccine. Bring back FOBT Continue to check left breast regularly Mammogram will be due again next September

## 2013-06-22 LAB — NMR, LIPOPROFILE
HDL Cholesterol by NMR: 67 mg/dL (ref >=40)
HDL Particle Number: 39.6 umol/L (ref >=30.5)
LDLC SERPL CALC-MCNC: 77 mg/dL (ref <100)
Triglycerides by NMR: 111 mg/dL (ref <150)

## 2013-06-22 LAB — BMP8+EGFR
BUN/Creatinine Ratio: 30 — ABNORMAL HIGH (ref 11–26)
BUN: 21 mg/dL (ref 8–27)
CO2: 25 mmol/L (ref 18–29)
Chloride: 100 mmol/L (ref 97–108)
GFR calc Af Amer: 97 mL/min/{1.73_m2} (ref >59)
Glucose: 86 mg/dL (ref 65–99)
Potassium: 4.4 mmol/L (ref 3.5–5.2)

## 2013-06-22 LAB — HEPATIC FUNCTION PANEL
AST: 23 IU/L (ref 0–40)
Albumin: 4.4 g/dL (ref 3.5–4.8)

## 2013-06-22 LAB — VITAMIN D 25 HYDROXY (VIT D DEFICIENCY, FRACTURES): Vit D, 25-Hydroxy: 43.5 ng/mL (ref 30.0–100.0)

## 2013-06-23 ENCOUNTER — Encounter: Payer: Self-pay | Admitting: *Deleted

## 2013-06-23 NOTE — Progress Notes (Signed)
Quick Note:  Copy of labs sent to patient ______ 

## 2013-07-02 ENCOUNTER — Other Ambulatory Visit: Payer: Self-pay | Admitting: Family Medicine

## 2013-07-04 NOTE — Telephone Encounter (Signed)
Taken care of

## 2013-07-13 ENCOUNTER — Ambulatory Visit (INDEPENDENT_AMBULATORY_CARE_PROVIDER_SITE_OTHER): Payer: Medicare Other

## 2013-07-13 ENCOUNTER — Ambulatory Visit (INDEPENDENT_AMBULATORY_CARE_PROVIDER_SITE_OTHER): Payer: Medicare Other | Admitting: Pharmacist

## 2013-07-13 VITALS — BP 128/70 | HR 68 | Ht 64.25 in | Wt 153.0 lb

## 2013-07-13 DIAGNOSIS — Z780 Asymptomatic menopausal state: Secondary | ICD-10-CM | POA: Diagnosis not present

## 2013-07-13 DIAGNOSIS — Z9011 Acquired absence of right breast and nipple: Secondary | ICD-10-CM | POA: Insufficient documentation

## 2013-07-13 DIAGNOSIS — Z0000 Encounter for general adult medical examination without abnormal findings: Secondary | ICD-10-CM | POA: Diagnosis not present

## 2013-07-13 MED ORDER — IPRATROPIUM-ALBUTEROL 0.5-2.5 (3) MG/3ML IN SOLN: 3.0000 mL | Freq: Four times a day (QID) | RESPIRATORY_TRACT | Status: DC | PRN

## 2013-07-13 MED ORDER — RALOXIFENE HCL 60 MG PO TABS: 60.0000 mg | ORAL_TABLET | Freq: Every day | ORAL | Status: DC

## 2013-07-13 NOTE — Patient Instructions (Signed)
Health Maintenance  Topic Date Due  . Zostavax  - recommended once 03/09/2014  . Influenza Vaccine - recommended yearly 06/22/2015  . Mammogram - recommended yearly- completed 05/04/2015  . Tetanus/tdap - up to date 11/03/2015  . Colonoscopy - recommended every 5 years- up to date 02/23/2017  . Pneumococcal Polysaccharide Vaccine Age 74 And Over   Completed       Fall Prevention and Home Safety Falls cause injuries and can affect all age groups. It is possible to use preventive measures to significantly decrease the likelihood of falls. There are many simple measures which can make your home safer and prevent falls. OUTDOORS  Repair cracks and edges of walkways and driveways.  Remove high doorway thresholds.  Trim shrubbery on the main path into your home.  Have good outside lighting.  Clear walkways of tools, rocks, debris, and clutter.  Check that handrails are not broken and are securely fastened. Both sides of steps should have handrails.  Have leaves, snow, and ice cleared regularly.  Use sand or salt on walkways during winter months.  In the garage, clean up grease or oil spills. BATHROOM  Install night lights.  Install grab bars by the toilet and in the tub and shower.  Use non-skid mats or decals in the tub or shower.  Place a plastic non-slip stool in the shower to sit on, if needed.  Keep floors dry and clean up all water on the floor immediately.  Remove soap buildup in the tub or shower on a regular basis.  Secure bath mats with non-slip, double-sided rug tape.  Remove throw rugs and tripping hazards from the floors. BEDROOMS  Install night lights.  Make sure a bedside light is easy to reach.  Do not use oversized bedding.  Keep a telephone by your bedside.  Have a firm chair with side arms to use for getting dressed.  Remove throw rugs and tripping hazards from the floor. KITCHEN  Keep handles on pots and pans turned toward the center of  the stove. Use back burners when possible.  Clean up spills quickly and allow time for drying.  Avoid walking on wet floors.  Avoid hot utensils and knives.  Position shelves so they are not too high or low.  Place commonly used objects within easy reach.  If necessary, use a sturdy step stool with a grab bar when reaching.  Keep electrical cables out of the way.  Do not use floor polish or wax that makes floors slippery. If you must use wax, use non-skid floor wax.  Remove throw rugs and tripping hazards from the floor. STAIRWAYS  Never leave objects on stairs.  Place handrails on both sides of stairways and use them. Fix any loose handrails. Make sure handrails on both sides of the stairways are as long as the stairs.  Check carpeting to make sure it is firmly attached along stairs. Make repairs to worn or loose carpet promptly.  Avoid placing throw rugs at the top or bottom of stairways, or properly secure the rug with carpet tape to prevent slippage. Get rid of throw rugs, if possible.  Have an electrician put in a light switch at the top and bottom of the stairs. OTHER FALL PREVENTION TIPS  Wear low-heel or rubber-soled shoes that are supportive and fit well. Wear closed toe shoes.  When using a stepladder, make sure it is fully opened and both spreaders are firmly locked. Do not climb a closed stepladder.  Add color or  contrast paint or tape to grab bars and handrails in your home. Place contrasting color strips on first and last steps.  Learn and use mobility aids as needed. Install an electrical emergency response system.  Turn on lights to avoid dark areas. Replace light bulbs that burn out immediately. Get light switches that glow.  Arrange furniture to create clear pathways. Keep furniture in the same place.  Firmly attach carpet with non-skid or double-sided tape.  Eliminate uneven floor surfaces.  Select a carpet pattern that does not visually hide the  edge of steps.  Be aware of all pets. OTHER HOME SAFETY TIPS  Set the water temperature for 120 F (48.8 C).  Keep emergency numbers on or near the telephone.  Keep smoke detectors on every level of the home and near sleeping areas. Document Released: 07/11/2002 Document Revised: 01/20/2012 Document Reviewed: 10/10/2011 [facility] Patient Information 2014 [place], [place].

## 2013-07-13 NOTE — Progress Notes (Signed)
Patient ID: Krystal [name], female   DOB: Dec 10, 1940, 72 y.o.   MRN: [id number] Subjective:    Krystal [name] is a 72 y.o. female who presents for Medicare Initial preventive examination.  Preventive Screening-Counseling & Management  Tobacco History  Smoking status  . Former Smoker  . Types: Cigarettes  . Quit date: 02/09/1961  Smokeless tobacco  . Never Used     Current Problems (verified) Patient Active Problem List   Diagnosis Date Noted  . History of right mastectomy 07/13/2013  . Anemia, iron deficiency 06/21/2013  . Allergic rhinitis 06/21/2013  . Chronic low back pain 06/21/2013  . NEOPLASM, MALIGNANT, BREAST 11/22/2007  . HYPERLIPIDEMIA 11/22/2007  . ANXIETY 11/22/2007  . DEPRESSION 11/22/2007  . ASTHMA, UNSPECIFIED, UNSPECIFIED STATUS 11/22/2007  . GERD 11/22/2007  . GASTRITIS, WITH HEMORRHAGE 08/14/2007  . DUODENITIS, WITHOUT HEMORRHAGE 08/14/2007  . HIATAL HERNIA 08/14/2007  . 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    Medications Prior to Visit Current Outpatient Prescriptions on File Prior to Visit  Medication Sig Dispense Refill  . aspirin 81 MG tablet Take 81 mg by mouth daily.      . cetirizine (ZYRTEC) 10 MG tablet Take 10 mg by mouth as needed.      . cholecalciferol (VITAMIN D) 1000 UNITS tablet Take by mouth daily. Take 5,000 units daily      . diazepam (VALIUM) 5 MG tablet Take 1 tablet (5 mg total) by mouth every 6 (six) hours as needed.  30 tablet  1  . ferrous sulfate 325 (65 FE) MG EC tablet Take 325 mg by mouth daily.      . fish oil-omega-3 fatty acids 1000 MG capsule Take 1 g by mouth daily.      . furosemide (LASIX) 20 MG tablet TAKE ONE TABLET BY MOUTH ONE TIME DAILY  30 tablet  5  . montelukast (SINGULAIR) 10 MG tablet TAKE ONE TABLET BY MOUTH AT BEDTIME  30 tablet  3  . Multiple Vitamins-Minerals (PRESERVISION/LUTEIN PO) Take 2 capsules by mouth daily.      Marland Kitchen NEXIUM 40 MG capsule TAKE 1 CAPSULE BY MOUTH ONCE   A DAY  30 capsule  5   . PROAIR HFA 108 (90 BASE) MCG/ACT inhaler INHALE TWO PUFFS BY MOUTH 4 TIMES DAILY AS NEEDED  9 each  3  . simvastatin (ZOCOR) 20 MG tablet Take 20 mg by mouth every evening.      . theophylline (THEO-24) 200 MG 24 hr capsule Take 200 mg by mouth daily.      . traMADol (ULTRAM) 50 MG tablet TAKE ONE TABLET BY MOUTH TWICE DAILY AS NEEDED  60 tablet  0   No current facility-administered medications on file prior to visit.    Current Medications (verified) Current Outpatient Prescriptions  Medication Sig Dispense Refill  . aspirin 81 MG tablet Take 81 mg by mouth daily.      . cetirizine (ZYRTEC) 10 MG tablet Take 10 mg by mouth as needed.      . cholecalciferol (VITAMIN D) 1000 UNITS tablet Take by mouth daily. Take 5,000 units daily      . diazepam (VALIUM) 5 MG tablet Take 1 tablet (5 mg total) by mouth every 6 (six) hours as needed.  30 tablet  1  . ferrous sulfate 325 (65 FE) MG EC tablet Take 325 mg by mouth daily.      . fish oil-omega-3 fatty acids 1000 MG capsule Take 1 g by mouth daily.      Marland Kitchen  furosemide (LASIX) 20 MG tablet TAKE ONE TABLET BY MOUTH ONE TIME DAILY  30 tablet  5  . montelukast (SINGULAIR) 10 MG tablet TAKE ONE TABLET BY MOUTH AT BEDTIME  30 tablet  3  . Multiple Vitamins-Minerals (PRESERVISION/LUTEIN PO) Take 2 capsules by mouth daily.      Marland Kitchen NEXIUM 40 MG capsule TAKE 1 CAPSULE BY MOUTH ONCE   A DAY  30 capsule  5  . PROAIR HFA 108 (90 BASE) MCG/ACT inhaler INHALE TWO PUFFS BY MOUTH 4 TIMES DAILY AS NEEDED  9 each  3  . simvastatin (ZOCOR) 20 MG tablet Take 20 mg by mouth every evening.      . theophylline (THEO-24) 200 MG 24 hr capsule Take 200 mg by mouth daily.      . traMADol (ULTRAM) 50 MG tablet TAKE ONE TABLET BY MOUTH TWICE DAILY AS NEEDED  60 tablet  0  . ipratropium-albuterol (DUONEB) 0.5-2.5 (3) MG/3ML SOLN Take 3 mLs by nebulization every 6 (six) hours as needed. Dx:  Asthma 493.90  360 mL  2  . raloxifene (EVISTA) 60 MG tablet Take 1 tablet (60 mg  total) by mouth daily.  30 tablet  2   No current facility-administered medications for this visit.     Allergies (verified) Actonel; Fosamax; Mevacor; and Miacalcin   PAST HISTORY  Family History Family History  Problem Relation Age of Onset  . Heart disease Mother 63    stroke   . Heart disease Sister   . Hypertension Sister   . Diabetes Sister   . Heart disease Brother   . Hyperlipidemia Brother   . Diabetes Brother   . Heart disease Sister   . Hypertension Sister   . Diabetes Sister   . Diabetes Sister   . Hypertension Sister   . Diabetes Sister   . Hypertension Sister   . Diabetes Brother   . Hyperlipidemia Brother   . Colon cancer Neg Hx   . Rectal cancer Neg Hx   . Stomach cancer Neg Hx   . Esophageal cancer Neg Hx   . Cancer Father     lung  . Heart attack Father   . Diabetes Brother   . Heart disease Brother   . Hyperlipidemia Brother   . Diabetes Brother     Social History History  Substance Use Topics  . Smoking status: Former Smoker    Types: Cigarettes    Quit date: 02/09/1961  . Smokeless tobacco: Never Used  . Alcohol Use: No     Are there smokers in your home (other than you)? No  Risk Factors Current exercise habits: The patient does not participate in regular exercise at present.  Dietary issues discussed: none   Cardiac risk factors: advanced age (older than 74 for men, 57 for women), dyslipidemia and family history of premature cardiovascular disease.  Depression Screen (Note: if answer to either of the following is "Yes", a more complete depression screening is indicated)   Over the past 2 weeks, have you felt down, depressed or hopeless? No  Over the past 2 weeks, have you felt little interest or pleasure in doing things? No  Have you lost interest or pleasure in daily life? No  Do you often feel hopeless? No  Do you cry easily over simple problems? No  Activities of Daily Living In your present state of health, do you have  any difficulty performing the following activities?:  Driving? No Managing money?  Yes Feeding yourself? Yes  Getting from bed to chair? Yes Climbing a flight of stairs? Yes Preparing food and eating?: No Bathing or showering? No Getting dressed: No Getting to the toilet? No Using the toilet:No Moving around from place to place: No In the past year have you fallen or had a near fall?:Yes   Are you sexually active?  No  Do you have more than one partner?  No  Hearing Difficulties: No Do you often ask people to speak up or repeat themselves? No Do you experience ringing or noises in your ears? No Do you have difficulty understanding soft or whispered voices? No   Do you feel that you have a problem with memory? No  Do you often misplace items? No  Do you feel safe at home?  Yes  Cognitive Testing  Alert? Yes  Normal Appearance?Yes  Oriented to person? Yes  Place? Yes   Time? Yes  Recall of three objects?  Yes  Can perform simple calculations? Yes  Displays appropriate judgment?Yes  Can read the correct time from a watch face?Yes   Advanced Directives have been discussed with the patient? No  List the Names of Other Physician/Practitioners you currently use: 1.  Dr Daphine Deutscher at [facility] - optomotrist 2.  Dr. Stephannie Li at [facility] - opthmologist 3.  Dr. Cleone Slim - oncologist 4.  Dr. Rob Bunting - GI    Immunization History  Administered Date(s) Administered  . Pneumococcal Conjugate-13 06/21/2013    Screening Tests Health Maintenance  Topic Date Due  . Zostavax  03/20/2014  . Influenza Vaccine  04/03/2014  . Mammogram  05/04/2015  . Tetanus/tdap  11/03/2015  . Colonoscopy  02/23/2017  . Pneumococcal Polysaccharide Vaccine Age 60 And Over  Completed    All answers were reviewed with the patient and necessary referrals were made:  Henrene Pastor, [facility]   07/13/2013   History reviewed: allergies, current medications, past family history,  past medical history, past social history, past surgical history and problem list   Objective:     VBody mass index is 26.06 kg/(m^2). BP 128/70  Pulse 68  Ht 5' 4.25" (1.632 m)  Wt 153 lb (69.4 kg)  BMI 26.06 kg/m2   DEXA was performed today in office.    DEXA Results Date of Test T-Score for AP Spine L1-L4 T-Score for Total Left Hip T-Score for Total Right Hip  07/13/2013 -2.5 -2.7 -2.4  06/04/2004 -2.2 -1.6  --  05/04/2001 -2.4 -2.0 --  04/16/1999 -2.2 -1.8 --       Assessment:     Annual Wellness Visit Osteoporosis     Plan:     During the course of the visit the patient was educated and counseled about appropriate screening and preventive services including:    Pneumococcal vaccine   Influenza vaccine  Screening mammography  Bone densitometry screening  Colorectal cancer screening  Patient refused influenza and zostavax vaccination.    Diet review for nutrition referral?   Not Indicated  Start evista 60mg  daily (patient refused all other medications for osteoporosis treatment) Rx for mastectomy bra, nebulizer mask and tubing and DuoNeb nebulization solution written today   Patient Instructions (the written plan) was given to the patient.  Medicare Attestation I have personally reviewed: The patient's medical and social history Their use of alcohol, tobacco or illicit drugs Their current medications and supplements The patient's functional ability including ADLs,fall risks, home safety risks, cognitive, and hearing and visual impairment Diet and physical activities Evidence for depression or  mood disorders  The patient's weight, height, BMI, and BP have been recorded in the chart.  I have made referrals, counseling, and provided education to the patient based on review of the above and I have provided the patient with a written personalized care plan for preventive services.     Henrene Pastor, [facility]   07/13/2013

## 2013-07-20 ENCOUNTER — Telehealth: Payer: Self-pay | Admitting: Family Medicine

## 2013-07-25 ENCOUNTER — Other Ambulatory Visit: Payer: Self-pay

## 2013-07-25 MED ORDER — TRAMADOL HCL 50 MG PO TABS: ORAL_TABLET | ORAL | Status: DC

## 2013-07-25 NOTE — Telephone Encounter (Signed)
Sent to [facility] 

## 2013-07-25 NOTE — Telephone Encounter (Signed)
Last sen 06/21/13   DWM  If approved print and route to nurse

## 2013-08-24 ENCOUNTER — Other Ambulatory Visit: Payer: Self-pay | Admitting: Nurse Practitioner

## 2013-08-24 ENCOUNTER — Encounter: Payer: Self-pay | Admitting: Pharmacist

## 2013-08-24 DIAGNOSIS — H353190 Nonexudative age-related macular degeneration, unspecified eye, stage unspecified: Secondary | ICD-10-CM | POA: Insufficient documentation

## 2013-09-13 ENCOUNTER — Telehealth: Payer: Self-pay | Admitting: Family Medicine

## 2013-09-14 ENCOUNTER — Other Ambulatory Visit: Payer: Self-pay | Admitting: Family Medicine

## 2013-09-14 MED ORDER — AZITHROMYCIN 250 MG PO TABS: ORAL_TABLET | ORAL | Status: DC

## 2013-09-15 ENCOUNTER — Encounter: Payer: Self-pay | Admitting: *Deleted

## 2013-09-29 ENCOUNTER — Other Ambulatory Visit: Payer: Self-pay | Admitting: Family Medicine

## 2013-10-01 ENCOUNTER — Other Ambulatory Visit: Payer: Self-pay | Admitting: Family Medicine

## 2013-10-27 ENCOUNTER — Other Ambulatory Visit: Payer: Self-pay | Admitting: Family Medicine

## 2013-10-28 NOTE — Telephone Encounter (Signed)
Last seen 06/21/13, last filled 05/02/13. Route to pool a, call into [place]

## 2013-10-28 NOTE — Telephone Encounter (Signed)
Phoned into [place] and left authorization on voicemail

## 2013-10-28 NOTE — Telephone Encounter (Signed)
This is okay to refill 

## 2013-11-08 ENCOUNTER — Ambulatory Visit: Payer: Medicare Other | Admitting: Family Medicine

## 2013-11-14 ENCOUNTER — Ambulatory Visit: Payer: Medicare Other | Admitting: Family Medicine

## 2013-12-16 ENCOUNTER — Telehealth: Payer: Self-pay | Admitting: Family Medicine

## 2013-12-16 MED ORDER — DOXYCYCLINE HYCLATE 100 MG PO TABS: 100.0000 mg | ORAL_TABLET | Freq: Two times a day (BID) | ORAL | Status: DC

## 2013-12-16 NOTE — Telephone Encounter (Signed)
Doxy 100 BID for 21 days was sent in  Med and BMP discussed with [facility] To be approved by [facility]  Pt aware

## 2013-12-28 ENCOUNTER — Other Ambulatory Visit: Payer: Self-pay | Admitting: Family Medicine

## 2013-12-29 ENCOUNTER — Other Ambulatory Visit: Payer: Self-pay | Admitting: Nurse Practitioner

## 2013-12-29 NOTE — Telephone Encounter (Signed)
Last seen 06/21/13  DWM   Do not see a Theophylline level in EPIC

## 2013-12-30 NOTE — Telephone Encounter (Signed)
Last seen and last lipid 06/21/13  DWM  Requesting 90 day supply

## 2014-01-13 ENCOUNTER — Telehealth: Payer: Self-pay | Admitting: *Deleted

## 2014-01-13 MED ORDER — DIAZEPAM 5 MG PO TABS
ORAL_TABLET | ORAL | Status: DC
Start: 2014-01-13 — End: 2014-03-13

## 2014-01-13 NOTE — Telephone Encounter (Signed)
Patient last seen in office on 06-21-13. Last refill on 11-26-12. Please advise. If approved please route to Pool A so nurse can phone in to [facility]

## 2014-01-13 NOTE — Telephone Encounter (Signed)
This is okay to call this prescription in for this patient

## 2014-01-15 ENCOUNTER — Other Ambulatory Visit: Payer: Self-pay | Admitting: Family Medicine

## 2014-01-17 NOTE — Telephone Encounter (Signed)
Left refill authorization on [employer].

## 2014-01-20 ENCOUNTER — Encounter: Payer: Self-pay | Admitting: Family Medicine

## 2014-01-20 ENCOUNTER — Ambulatory Visit (INDEPENDENT_AMBULATORY_CARE_PROVIDER_SITE_OTHER): Payer: Medicare Other | Admitting: Family Medicine

## 2014-01-20 ENCOUNTER — Ambulatory Visit (INDEPENDENT_AMBULATORY_CARE_PROVIDER_SITE_OTHER): Payer: Medicare Other

## 2014-01-20 VITALS — BP 106/59 | HR 105 | Temp 98.5°F | Ht 64.5 in | Wt 154.0 lb

## 2014-01-20 DIAGNOSIS — J45909 Unspecified asthma, uncomplicated: Secondary | ICD-10-CM | POA: Diagnosis not present

## 2014-01-20 DIAGNOSIS — E785 Hyperlipidemia, unspecified: Secondary | ICD-10-CM

## 2014-01-20 DIAGNOSIS — M1710 Unilateral primary osteoarthritis, unspecified knee: Secondary | ICD-10-CM

## 2014-01-20 DIAGNOSIS — IMO0002 Reserved for concepts with insufficient information to code with codable children: Secondary | ICD-10-CM

## 2014-01-20 DIAGNOSIS — M1711 Unilateral primary osteoarthritis, right knee: Secondary | ICD-10-CM

## 2014-01-20 DIAGNOSIS — D509 Iron deficiency anemia, unspecified: Secondary | ICD-10-CM

## 2014-01-20 DIAGNOSIS — E559 Vitamin D deficiency, unspecified: Secondary | ICD-10-CM

## 2014-01-20 NOTE — Patient Instructions (Signed)
Continue current medication Return to clinic for fasting lab work If the pain continues to get severe in the right knee return to clinic and we will get or arrange/for an injection in this knee. Always be careful and do not put herself at risk for falling Drink plenty of fluid

## 2014-01-20 NOTE — Progress Notes (Signed)
Subjective:    Patient ID: Krystal [name], female    DOB: 10-Feb-1941, 73 y.o.   MRN: [id number]  HPI Pt here for follow up and management of chronic medical problems. The patient and her sister continue to look after her elderly mom at term who is bedridden because of a stroke. The patient complains of right knee pain which is very severe. She cannot take the time to have it operated on at this point in time. She will get a chest x-ray today. She will be given FOBT to return. She will come back fasting for lab work. She also complains of a lesion in her hairline above the right ear. The patient checks her chest wall and left breast regularly and has not noted any lumps or masses.       Patient Active Problem List   Diagnosis Date Noted  . Macular degeneration, age related, nonexudative 08/24/2013  . History of right mastectomy 07/13/2013  . Anemia, iron deficiency 06/21/2013  . Allergic rhinitis 06/21/2013  . Chronic low back pain 06/21/2013  . NEOPLASM, MALIGNANT, BREAST 11/22/2007  . HYPERLIPIDEMIA 11/22/2007  . ANXIETY 11/22/2007  . DEPRESSION 11/22/2007  . ASTHMA, UNSPECIFIED, UNSPECIFIED STATUS 11/22/2007  . GERD 11/22/2007  . GASTRITIS, WITH HEMORRHAGE 08/14/2007  . DUODENITIS, WITHOUT HEMORRHAGE 08/14/2007  . HIATAL HERNIA 08/14/2007  . 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   Outpatient Encounter Prescriptions as of 01/20/2014  Medication Sig  . aspirin 81 MG tablet Take 81 mg by mouth daily.  . cetirizine (ZYRTEC) 10 MG tablet Take 10 mg by mouth as needed.  . cholecalciferol (VITAMIN D) 1000 UNITS tablet Take by mouth daily. Take 5,000 units daily  . diazepam (VALIUM) 5 MG tablet TAKE ONE TABLET BY MOUTH EVERY SIX HOURS AS NEEDED  . ferrous sulfate 325 (65 FE) MG EC tablet Take 325 mg by mouth daily.  . fish oil-omega-3 fatty acids 1000 MG capsule Take 1 g by mouth daily.  . furosemide (LASIX) 20 MG tablet TAKE ONE TABLET BY MOUTH ONE TIME DAILY  .  ipratropium-albuterol (DUONEB) 0.5-2.5 (3) MG/3ML SOLN Take 3 mLs by nebulization every 6 (six) hours as needed. Dx:  Asthma 493.90  . montelukast (SINGULAIR) 10 MG tablet TAKE ONE TABLET BY MOUTH AT BEDTIME  . Multiple Vitamins-Minerals (PRESERVISION/LUTEIN PO) Take 2 capsules by mouth daily.  Marland Kitchen NEXIUM 40 MG capsule TAKE ONE CAPSULE BY MOUTH ONE TIME DAILY  . PROAIR HFA 108 (90 BASE) MCG/ACT inhaler INHALE TWO PUFFS BY MOUTH 4 TIMES DAILY AS NEEDED  . simvastatin (ZOCOR) 20 MG tablet TAKE ONE TABLET BY MOUTH ONE TIME DAILY  . theophylline (THEODUR) 200 MG 12 hr tablet TAKE ONE TABLET BY MOUTH EVERY TWELVE HOURS  . traMADol (ULTRAM) 50 MG tablet TAKE ONE TABLET BY MOUTH TWICE DAILY AS NEEDED  . [DISCONTINUED] azithromycin (ZITHROMAX) 250 MG tablet 2 tablets the first day then one tablet daily  . [DISCONTINUED] doxycycline (VIBRA-TABS) 100 MG tablet Take 1 tablet (100 mg total) by mouth 2 (two) times daily.  . [DISCONTINUED] raloxifene (EVISTA) 60 MG tablet Take 1 tablet (60 mg total) by mouth daily.     Review of Systems  Constitutional: Negative.   HENT: Negative.   Eyes: Negative.   Respiratory: Negative.   Cardiovascular: Negative.   Gastrointestinal: Negative.   Endocrine: Negative.   Genitourinary: Negative.   Musculoskeletal: Negative.   Skin: Negative.        Skin lesion in right side - hairline  Allergic/Immunologic: Negative.  Neurological: Negative.   Hematological: Negative.   Psychiatric/Behavioral: Negative.        Objective:   Physical Exam  Nursing note and vitals reviewed. Constitutional: She is oriented to person, place, and time. She appears well-developed and well-nourished. No distress.  HENT:  Head: Normocephalic and atraumatic.  Right Ear: External ear normal.  Left Ear: External ear normal.  Nose: Nose normal.  Mouth/Throat: Oropharynx is clear and moist.  Eyes: Conjunctivae and EOM are normal. Pupils are equal, round, and reactive to light. Right  eye exhibits no discharge. Left eye exhibits no discharge. No scleral icterus.  Neck: Normal range of motion. Neck supple. No JVD present. No thyromegaly present.  Cardiovascular: Normal rate, regular rhythm, normal heart sounds and intact distal pulses.   No murmur heard. Pulmonary/Chest: Effort normal and breath sounds normal. No respiratory distress. She has no wheezes. She has no rales. She exhibits no tenderness.  Abdominal: Soft. Bowel sounds are normal. She exhibits no mass. There is no tenderness. There is no rebound and no guarding.  Musculoskeletal: Normal range of motion. She exhibits no edema and no tenderness.  Bilateral joint line pain in the right knee with palpation and with flexion and extension.  Lymphadenopathy:    She has no cervical adenopathy.  Neurological: She is alert and oriented to person, place, and time. She has normal reflexes.  Skin: Skin is warm and dry. No rash noted.  Psychiatric: She has a normal mood and affect. Her behavior is normal. Judgment and thought content normal.   BP 106/59  Pulse 105  Temp(Src) 98.5 F (36.9 C) (Oral)  Ht 5' 4.5" (1.638 m)  Wt 154 lb (69.854 kg)  BMI 26.04 kg/m2  [facility] reading (PRIMARY) by  [facility]-chest x-ray-no active disease, no change from previous film. Diaphragmatic hernia present                                                 Assessment & Plan:   1. HYPERLIPIDEMIA - DG Chest 2 View; Future - POCT CBC; Future - BMP8+EGFR; Future - Hepatic function panel; Future - NMR, lipoprofile; Future  2. ASTHMA, UNSPECIFIED, UNSPECIFIED STATUS - DG Chest 2 View; Future - POCT CBC; Future  3. Anemia, iron deficiency - DG Chest 2 View; Future - POCT CBC; Future  4. Vitamin D deficiency - POCT CBC; Future - Vit D  25 hydroxy (rtn osteoporosis monitoring); Future  5. Osteoarthritis of right knee, unspecified osteoarthritis type   No orders of the defined types were placed in this encounter.   Patient  Instructions  Continue current medication Return to clinic for fasting lab work If the pain continues to get severe in the right knee return to clinic and we will get or arrange/for an injection in this knee. Always be careful and do not put herself at risk for falling Drink plenty of fluid   Arrie Senate MD

## 2014-01-27 ENCOUNTER — Other Ambulatory Visit (INDEPENDENT_AMBULATORY_CARE_PROVIDER_SITE_OTHER): Payer: Medicare Other

## 2014-01-27 DIAGNOSIS — E559 Vitamin D deficiency, unspecified: Secondary | ICD-10-CM | POA: Diagnosis not present

## 2014-01-27 DIAGNOSIS — D509 Iron deficiency anemia, unspecified: Secondary | ICD-10-CM | POA: Diagnosis not present

## 2014-01-27 DIAGNOSIS — J45909 Unspecified asthma, uncomplicated: Secondary | ICD-10-CM

## 2014-01-27 DIAGNOSIS — E785 Hyperlipidemia, unspecified: Secondary | ICD-10-CM | POA: Diagnosis not present

## 2014-01-28 LAB — BMP8+EGFR
BUN/Creatinine Ratio: 28 — ABNORMAL HIGH (ref 11–26)
BUN: 19 mg/dL (ref 8–27)
CALCIUM: 9.8 mg/dL (ref 8.7–10.3)
CO2: 24 mmol/L (ref 18–29)
Chloride: 101 mmol/L (ref 97–108)
Creatinine, Ser: 0.69 mg/dL (ref 0.57–1.00)
GFR calc Af Amer: 99 mL/min/{1.73_m2} (ref >59)
GFR calc non Af Amer: 86 mL/min/{1.73_m2} (ref >59)
Glucose: 89 mg/dL (ref 65–99)
Potassium: 4.4 mmol/L (ref 3.5–5.2)
Sodium: 141 mmol/L (ref 134–144)

## 2014-01-28 LAB — NMR, LIPOPROFILE
CHOLESTEROL: 154 mg/dL (ref 100–199)
HDL CHOLESTEROL BY NMR: 64 mg/dL (ref >39)
HDL Particle Number: 37.8 umol/L (ref >=30.5)
LDL Particle Number: 738 nmol/L (ref <1000)
LDL SIZE: 20.5 nm (ref >20.5)
LDLC SERPL CALC-MCNC: 75 mg/dL (ref 0–99)
LP-IR Score: <25 (ref <=45)
Small LDL Particle Number: 258 nmol/L (ref <=527)
TRIGLYCERIDES BY NMR: 74 mg/dL (ref 0–149)

## 2014-01-28 LAB — CBC WITH DIFFERENTIAL
Basophils Absolute: 0 10*3/uL (ref 0.0–0.2)
Basos: 0 %
Eos: 5 %
Eosinophils Absolute: 0.2 10*3/uL (ref 0.0–0.4)
HEMATOCRIT: 40.7 % (ref 34.0–46.6)
Hemoglobin: 13.9 g/dL (ref 11.1–15.9)
IMMATURE GRANULOCYTES: 0 %
Immature Grans (Abs): 0 10*3/uL (ref 0.0–0.1)
LYMPHS ABS: 1.5 10*3/uL (ref 0.7–3.1)
Lymphs: 31 %
MCH: 30 pg (ref 26.6–33.0)
MCHC: 34.2 g/dL (ref 31.5–35.7)
MCV: 88 fL (ref 79–97)
MONOCYTES: 8 %
MONOS ABS: 0.4 10*3/uL (ref 0.1–0.9)
NEUTROS ABS: 2.7 10*3/uL (ref 1.4–7.0)
Neutrophils Relative %: 56 %
PLATELETS: 159 10*3/uL (ref 150–379)
RBC: 4.64 x10E6/uL (ref 3.77–5.28)
RDW: 12.7 % (ref 12.3–15.4)
WBC: 4.8 10*3/uL (ref 3.4–10.8)

## 2014-01-28 LAB — HEPATIC FUNCTION PANEL
ALT: 17 IU/L (ref 0–32)
AST: 23 IU/L (ref 0–40)
Albumin: 4.2 g/dL (ref 3.5–4.8)
Alkaline Phosphatase: 44 IU/L (ref 39–117)
BILIRUBIN DIRECT: 0.13 mg/dL (ref 0.00–0.40)
BILIRUBIN TOTAL: 0.5 mg/dL (ref 0.0–1.2)
TOTAL PROTEIN: 6.3 g/dL (ref 6.0–8.5)

## 2014-01-28 LAB — VITAMIN D 25 HYDROXY (VIT D DEFICIENCY, FRACTURES): Vit D, 25-Hydroxy: 50.3 ng/mL (ref 30.0–100.0)

## 2014-01-29 ENCOUNTER — Other Ambulatory Visit: Payer: Self-pay | Admitting: Family Medicine

## 2014-03-11 ENCOUNTER — Telehealth: Payer: Self-pay | Admitting: Nurse Practitioner

## 2014-03-11 NOTE — Telephone Encounter (Signed)
Patient aware.

## 2014-03-11 NOTE — Telephone Encounter (Signed)
lmtcb ac

## 2014-03-11 NOTE — Telephone Encounter (Signed)
NTBS.

## 2014-03-13 ENCOUNTER — Emergency Department (HOSPITAL_COMMUNITY): Payer: Medicare Other

## 2014-03-13 ENCOUNTER — Ambulatory Visit (INDEPENDENT_AMBULATORY_CARE_PROVIDER_SITE_OTHER): Payer: Medicare Other

## 2014-03-13 ENCOUNTER — Emergency Department (HOSPITAL_COMMUNITY)
Admission: EM | Admit: 2014-03-13 | Discharge: 2014-03-13 | Disposition: A | Discharge status: Home / Self Care | Payer: Medicare Other | Attending: Emergency Medicine | Admitting: Emergency Medicine

## 2014-03-13 ENCOUNTER — Other Ambulatory Visit: Payer: Self-pay

## 2014-03-13 ENCOUNTER — Ambulatory Visit (INDEPENDENT_AMBULATORY_CARE_PROVIDER_SITE_OTHER): Payer: Medicare Other | Admitting: Nurse Practitioner

## 2014-03-13 ENCOUNTER — Encounter: Payer: Self-pay | Admitting: Nurse Practitioner

## 2014-03-13 ENCOUNTER — Telehealth: Payer: Self-pay | Admitting: Family Medicine

## 2014-03-13 ENCOUNTER — Encounter (HOSPITAL_COMMUNITY): Payer: Self-pay | Admitting: Emergency Medicine

## 2014-03-13 VITALS — BP 148/78 | HR 120 | Temp 98.0°F | Ht 64.5 in | Wt 145.0 lb

## 2014-03-13 DIAGNOSIS — M5430 Sciatica, unspecified side: Secondary | ICD-10-CM

## 2014-03-13 DIAGNOSIS — J449 Chronic obstructive pulmonary disease, unspecified: Secondary | ICD-10-CM | POA: Diagnosis not present

## 2014-03-13 DIAGNOSIS — M5441 Lumbago with sciatica, right side: Secondary | ICD-10-CM

## 2014-03-13 DIAGNOSIS — E785 Hyperlipidemia, unspecified: Secondary | ICD-10-CM | POA: Diagnosis not present

## 2014-03-13 DIAGNOSIS — Z79899 Other long term (current) drug therapy: Secondary | ICD-10-CM | POA: Diagnosis not present

## 2014-03-13 DIAGNOSIS — Z860100 Personal history of colon polyps, unspecified: Secondary | ICD-10-CM | POA: Insufficient documentation

## 2014-03-13 DIAGNOSIS — J4489 Other specified chronic obstructive pulmonary disease: Secondary | ICD-10-CM | POA: Diagnosis not present

## 2014-03-13 DIAGNOSIS — R109 Unspecified abdominal pain: Secondary | ICD-10-CM

## 2014-03-13 DIAGNOSIS — IMO0002 Reserved for concepts with insufficient information to code with codable children: Secondary | ICD-10-CM | POA: Diagnosis not present

## 2014-03-13 DIAGNOSIS — R300 Dysuria: Secondary | ICD-10-CM | POA: Diagnosis not present

## 2014-03-13 DIAGNOSIS — M5442 Lumbago with sciatica, left side: Principal | ICD-10-CM

## 2014-03-13 DIAGNOSIS — M545 Low back pain, unspecified: Secondary | ICD-10-CM

## 2014-03-13 DIAGNOSIS — Z7982 Long term (current) use of aspirin: Secondary | ICD-10-CM | POA: Diagnosis not present

## 2014-03-13 DIAGNOSIS — Z853 Personal history of malignant neoplasm of breast: Secondary | ICD-10-CM | POA: Diagnosis not present

## 2014-03-13 DIAGNOSIS — Z87891 Personal history of nicotine dependence: Secondary | ICD-10-CM | POA: Insufficient documentation

## 2014-03-13 DIAGNOSIS — R040 Epistaxis: Secondary | ICD-10-CM

## 2014-03-13 DIAGNOSIS — K449 Diaphragmatic hernia without obstruction or gangrene: Secondary | ICD-10-CM | POA: Diagnosis not present

## 2014-03-13 DIAGNOSIS — Z0389 Encounter for observation for other suspected diseases and conditions ruled out: Secondary | ICD-10-CM | POA: Diagnosis not present

## 2014-03-13 LAB — COMPREHENSIVE METABOLIC PANEL
ALT: 7 U/L (ref 0–35)
AST: 16 U/L (ref 0–37)
Albumin: 3.3 g/dL — ABNORMAL LOW (ref 3.5–5.2)
Alkaline Phosphatase: 57 U/L (ref 39–117)
Anion gap: 15 (ref 5–15)
BUN: 12 mg/dL (ref 6–23)
CALCIUM: 9.8 mg/dL (ref 8.4–10.5)
CO2: 30 meq/L (ref 19–32)
CREATININE: 0.6 mg/dL (ref 0.50–1.10)
Chloride: 91 mEq/L — ABNORMAL LOW (ref 96–112)
GFR calc Af Amer: >90 mL/min (ref >90)
GFR calc non Af Amer: 88 mL/min — ABNORMAL LOW (ref >90)
Glucose, Bld: 112 mg/dL — ABNORMAL HIGH (ref 70–99)
Potassium: 3.8 mEq/L (ref 3.7–5.3)
Sodium: 136 mEq/L — ABNORMAL LOW (ref 137–147)
Total Bilirubin: 0.3 mg/dL (ref 0.3–1.2)
Total Protein: 7.4 g/dL (ref 6.0–8.3)

## 2014-03-13 LAB — POCT CBC
Granulocyte percent: 89.9 %G — AB (ref 37–80)
HCT, POC: 45.9 % (ref 37.7–47.9)
HEMOGLOBIN: 14.9 g/dL (ref 12.2–16.2)
LYMPH, POC: 1 (ref 0.6–3.4)
MCH: 29 pg (ref 27–31.2)
MCHC: 32.4 g/dL (ref 31.8–35.4)
MCV: 89.4 fL (ref 80–97)
MPV: 6.6 fL (ref 0–99.8)
PLATELET COUNT, POC: 360 10*3/uL (ref 142–424)
POC Granulocyte: 9.8 — AB (ref 2–6.9)
POC LYMPH PERCENT: 9.1 %L — AB (ref 10–50)
RBC: 5.1 M/uL (ref 4.04–5.48)
RDW, POC: 11.9 %
WBC: 10.9 10*3/uL — AB (ref 4.6–10.2)

## 2014-03-13 LAB — POCT UA - MICROSCOPIC ONLY
Casts, Ur, LPF, POC: NEGATIVE
Crystals, Ur, HPF, POC: NEGATIVE
Mucus, UA: NEGATIVE
Yeast, UA: NEGATIVE

## 2014-03-13 LAB — POCT URINALYSIS DIPSTICK

## 2014-03-13 MED ORDER — MORPHINE SULFATE 2 MG/ML IJ SOLN
INTRAMUSCULAR | Status: AC
  Administered 2014-03-13: 2 mg via INTRAVENOUS
  Filled 2014-03-13: qty 1

## 2014-03-13 MED ORDER — PREDNISONE 10 MG PO TABS: 20.0000 mg | ORAL_TABLET | Freq: Every day | ORAL | Status: DC

## 2014-03-13 MED ORDER — KETOROLAC TROMETHAMINE 30 MG/ML IJ SOLN
INTRAMUSCULAR | Status: AC
  Administered 2014-03-13: 30 mg
  Filled 2014-03-13: qty 1

## 2014-03-13 MED ORDER — CYCLOBENZAPRINE HCL 5 MG PO TABS: 5.0000 mg | ORAL_TABLET | Freq: Three times a day (TID) | ORAL | Status: DC | PRN

## 2014-03-13 MED ORDER — ONDANSETRON HCL 4 MG/2ML IJ SOLN
INTRAMUSCULAR | Status: AC
  Administered 2014-03-13: 4 mg
  Filled 2014-03-13: qty 2

## 2014-03-13 MED ORDER — IOHEXOL 300 MG/ML  SOLN
100.0000 mL | Freq: Once | INTRAMUSCULAR | Status: AC | PRN
  Administered 2014-03-13: 100 mL via INTRAVENOUS

## 2014-03-13 MED ORDER — IOHEXOL 300 MG/ML  SOLN
50.0000 mL | Freq: Once | INTRAMUSCULAR | Status: AC | PRN
  Administered 2014-03-13: 50 mL via ORAL

## 2014-03-13 MED ORDER — OXYCODONE-ACETAMINOPHEN 5-325 MG PO TABS: 1.0000 | ORAL_TABLET | ORAL | Status: DC | PRN

## 2014-03-13 NOTE — Telephone Encounter (Signed)
appt scheduled for today at 3:30 with mmm

## 2014-03-13 NOTE — ED Provider Notes (Signed)
CSN: [id number]     Arrival date & time 03/13/14  1724 History   First MD Initiated Contact with Patient 03/13/14 1738     Chief Complaint  Patient presents with  . Abdominal Pain     (Consider location/radiation/quality/duration/timing/severity/associated sxs/prior Treatment) HPI.... nursing notes suggested chief complaint of abdominal pain. However when I examined patient, her chief complaint was right lower back pain with radiation to her right leg.  Patient was initially seen at [facility] primary care with concerns of lower abdominal pain for one week. No fever, chills, dysuria, nausea, vomiting, diarrhea. Patient has had a partial hysterectomy.  Severity of symptoms is mild to moderate.  No bowel or bladder incontinence  Past Medical History  Diagnosis Date  . COPD (chronic obstructive pulmonary disease)   . Hyperlipidemia   . Edema of lower extremity   . Asthma   . Right knee pain   . Breast cancer     right breast  . Brain tumor     1986  . Colon polyps   . Hernia     hiatal   Past Surgical History  Procedure Laterality Date  . Mastectomy      right breast  . Partial hysterectomy    . Craniectomy / craniotomy for excision of brain tumor      left side brain  . Skin cancer excision      under left eye  . Colonoscopy    . Polypectomy     Family History  Problem Relation Age of Onset  . Heart disease Mother 58    stroke   . Heart disease Sister   . Hypertension Sister   . Diabetes Sister   . Heart disease Brother   . Hyperlipidemia Brother   . Diabetes Brother   . Heart disease Sister   . Hypertension Sister   . Diabetes Sister   . Diabetes Sister   . Hypertension Sister   . Diabetes Sister   . Hypertension Sister   . Diabetes Brother   . Hyperlipidemia Brother   . Colon cancer Neg Hx   . Rectal cancer Neg Hx   . Stomach cancer Neg Hx   . Esophageal cancer Neg Hx   . Cancer Father     lung  . Heart attack Father   . Diabetes Brother   .  Heart disease Brother   . Hyperlipidemia Brother   . Diabetes Brother    History  Substance Use Topics  . Smoking status: Former Smoker    Types: Cigarettes    Quit date: 02/09/1961  . Smokeless tobacco: Never Used  . Alcohol Use: No   OB History   Grav Para Term Preterm Abortions TAB SAB Ect Mult Living                 Review of Systems  All other systems reviewed and are negative.     Allergies  Actonel; Fosamax; Mevacor; and Miacalcin  Home Medications   Prior to Admission medications   Medication Sig Start Date End Date Taking? Authorizing Provider  albuterol (PROAIR HFA) 108 (90 BASE) MCG/ACT inhaler Inhale 1 puff into the lungs 4 (four) times daily as needed for wheezing or shortness of breath.   Yes Historical Provider, MD  aspirin EC 81 MG tablet Take 81 mg by mouth at bedtime.   Yes Historical Provider, MD  cetirizine (ZYRTEC) 10 MG tablet Take 10 mg by mouth at bedtime.    Yes Historical Provider, MD  Cholecalciferol (VITAMIN D) 2000 UNITS CAPS Take 1 capsule by mouth at bedtime.   Yes Historical Provider, MD  diazepam (VALIUM) 5 MG tablet Take 5 mg by mouth every 6 (six) hours as needed for anxiety.   Yes Historical Provider, MD  esomeprazole (NEXIUM) 40 MG capsule Take 40 mg by mouth at bedtime.   Yes Historical Provider, MD  ferrous sulfate 325 (65 FE) MG tablet Take 325 mg by mouth at bedtime.   Yes Historical Provider, MD  fish oil-omega-3 fatty acids 1000 MG capsule Take 1 g by mouth at bedtime.    Yes Historical Provider, MD  furosemide (LASIX) 20 MG tablet Take 20 mg by mouth daily as needed for fluid.   Yes Historical Provider, MD  ipratropium-albuterol (DUONEB) 0.5-2.5 (3) MG/3ML SOLN Take 3 mLs by nebulization every 6 (six) hours as needed. Dx:  Asthma 493.90 07/13/13  Yes Mary-Margaret Hassell Done, FNP  montelukast (SINGULAIR) 10 MG tablet Take 10 mg by mouth at bedtime.   Yes Historical Provider, MD  Multiple Vitamins-Minerals (PRESERVISION/LUTEIN PO) Take 1  capsule by mouth at bedtime.    Yes Historical Provider, MD  Phenazopyridine HCl (AZO TABS PO) Take 1 tablet by mouth as needed (for bladder pain).   Yes Historical Provider, MD  simvastatin (ZOCOR) 20 MG tablet Take 20 mg by mouth at bedtime.   Yes Historical Provider, MD  theophylline (THEODUR) 200 MG 12 hr tablet Take 200 mg by mouth at bedtime.   Yes Historical Provider, MD  traMADol (ULTRAM) 50 MG tablet Take 50 mg by mouth 2 (two) times daily as needed for moderate pain.   Yes Historical Provider, MD  cyclobenzaprine (FLEXERIL) 5 MG tablet Take 1 tablet (5 mg total) by mouth 3 (three) times daily as needed for muscle spasms. 03/13/14   Nat Christen, MD  oxyCODONE-acetaminophen (PERCOCET) 5-325 MG per tablet Take 1 tablet by mouth every 4 (four) hours as needed. 03/13/14   Nat Christen, MD  predniSONE (DELTASONE) 10 MG tablet Take 2 tablets (20 mg total) by mouth daily. 03/13/14   Nat Christen, MD   BP 171/87  Pulse 100  Temp(Src) 98.1 F (36.7 C) (Oral)  Resp 12  Ht 5\' 4"  (1.626 m)  Wt 145 lb (65.772 kg)  BMI 24.88 kg/m2  SpO2 97% Physical Exam  Nursing note and vitals reviewed. Constitutional: She is oriented to person, place, and time. She appears well-developed and well-nourished.  HENT:  Head: Normocephalic and atraumatic.  Eyes: Conjunctivae and EOM are normal. Pupils are equal, round, and reactive to light.  Neck: Normal range of motion. Neck supple.  Cardiovascular: Normal rate, regular rhythm and normal heart sounds.   Pulmonary/Chest: Effort normal and breath sounds normal.  Abdominal: Soft. Bowel sounds are normal.  Musculoskeletal: Normal range of motion.  Minimal tenderness of the right lower back.  Pain with straight leg raise to the right.  Neurological: She is alert and oriented to person, place, and time.  Skin: Skin is warm and dry.  Psychiatric: She has a normal mood and affect. Her behavior is normal.    ED Course  Procedures (including critical care time) Labs  Review Labs Reviewed  COMPREHENSIVE METABOLIC PANEL - Abnormal; Notable for the following:    Sodium 136 (*)    Chloride 91 (*)    Glucose, Bld 112 (*)    Albumin 3.3 (*)    GFR calc non Af Amer 88 (*)    All other components within normal limits    Imaging  Review Dg Lumbar Spine 2-3 Views  03/13/2014   CLINICAL DATA:  Low back pain, sciatica  EXAM: LUMBAR SPINE - 2-3 VIEW  COMPARISON:  None.  FINDINGS: Two views of lumbar spine submitted. There is diffuse osteopenia. Significant disc space flattening with vacuum disc phenomenon noted at L5-S1 level. No acute fracture or subluxation.  IMPRESSION: No acute fracture or subluxation. Diffuse osteopenia. Significant disc space flattening with vacuum disc phenomenon at L5-S1 level.   Electronically Signed   By: Lahoma Crocker M.D.   On: 03/13/2014 16:58   Dg Abd 1 View  03/13/2014   CLINICAL DATA:  Low back pain  EXAM: ABDOMEN - 1 VIEW  COMPARISON:  None.  FINDINGS: Significant stool noted in right colon. Gaseous distended loops in mid abdomen suspicious for ileus or early bowel obstruction. Follow-up examination is recommended.  IMPRESSION: Significant stool noted in right colon. Gaseous distended bowel loop in mid abdomen suspicious for ileus or early bowel obstruction. Clinical correlation is necessary.   Electronically Signed   By: Lahoma Crocker M.D.   On: 03/13/2014 17:00   Ct Abdomen Pelvis W Contrast  03/13/2014   CLINICAL DATA:  Abdominal pain and dysuria with low back pain.  EXAM: CT ABDOMEN AND PELVIS WITH CONTRAST  TECHNIQUE: Multidetector CT imaging of the abdomen and pelvis was performed using the standard protocol following bolus administration of intravenous contrast.  CONTRAST:  22mL OMNIPAQUE IOHEXOL 300 MG/ML SOLN, 112mL OMNIPAQUE IOHEXOL 300 MG/ML SOLN  COMPARISON:  01/17/2005  FINDINGS: Lung bases are within normal. No change in a moderate size hiatal hernia.  Abdominal images demonstrate a subcentimeter hypodensity over the central aspect  of the dome of the liver the as well as over the left lobe likely cysts. The gallbladder, spleen, pancreas and adrenal glands are within normal. Kidneys are normal in size without evidence of hydronephrosis or nephrolithiasis. Ureters are within normal. The appendix is normal. There is minimal calcified plaque involving the abdominal aorta. Retroaortic left renal vein is present. There are no dilated small bowel loops present. There is an air-filled mildly redundant sigmoid colon extending into the right mid abdomen. There is no free fluid or focal inflammatory change. There is minimal diverticulosis of the colon.  Pelvic images demonstrate surgical absence of the uterus. The bladder and rectum are within normal. There are mild degenerative changes of the spine and hips. Disc disease is present at the L5-S1 level.  IMPRESSION: No acute findings in the abdomen/ pelvis. No evidence of small bowel obstruction or ileus.  Moderate size hiatal hernia unchanged.  Two subcentimeter liver hypodensities likely cysts.  Minimal diverticulosis of the colon.   Electronically Signed   By: Marin Olp M.D.   On: 03/13/2014 20:15     EKG Interpretation None      Date: 03/13/2014  Rate: 99  Rhythm: normal sinus rhythm  QRS Axis: normal  Intervals: normal  ST/T Wave abnormalities: normal  Conduction Disutrbances: none  Narrative Interpretation: unremarkable    MDM   Final diagnoses:  Low back pain potentially associated with radiculopathy    CT scan abdomen pelvis obtained per desire of primary care physician.  No acute findings.   My exam suggests a lumbar radiculopathy. She feels better after pain management. Discharge medications Percocet, Flexeril 5 mg and prednisone.  She will followup with her primary care Dr.    Nat Christen, MD 03/14/14 1126

## 2014-03-13 NOTE — ED Notes (Signed)
Patient assisted to restroom. States this is the worst she has hurt in a long time. States she needs something to knock her out for a couple of hours. Patient back in bed and placed back on monitor.

## 2014-03-13 NOTE — ED Notes (Signed)
Pt had urinalysis and cbc at office, please refer to those results. CMET ordered per protocol for abdominal pain and to check BUN CRE prior to CT. Explained to pt reason for delay.

## 2014-03-13 NOTE — Progress Notes (Signed)
Subjective:    Patient ID: Krystal [name], female    DOB: 1940/12/30, 73 y.o.   MRN: [id number]  HPI Patient in c/o low back pain and leg pain- started Friday night and has gradually gotten worse- She does c/o dysuria that came along with it. She say s that the pain starts in her lower back and goes all the way down the back of both legs to ankles- cannot get comfortable- rates pain 10/10- nothing seems to help pain- pain worsens when moving. She has had this pain in the past but this is the worst. She also says that her abdomen is hurting- no recent bowel movements  * currently has a nose bleed  Review of Systems  Constitutional: Negative.   HENT: Negative.   Respiratory: Negative.   Cardiovascular: Negative.   Genitourinary: Negative.   Neurological: Negative.   Psychiatric/Behavioral: Negative.   All other systems reviewed and are negative.      Objective:   Physical Exam  Constitutional: She is oriented to person, place, and time. She appears well-developed and well-nourished.  HENT:  Current nose bleed- nose lesions in nose- large clot in back of nose  Eyes: Conjunctivae are normal. Pupils are equal, round, and reactive to light.  Neck: Normal range of motion. Neck supple.  Cardiovascular: Normal rate and normal heart sounds.   Pulmonary/Chest: Effort normal and breath sounds normal.  Abdominal: Soft. Bowel sounds are normal.  Musculoskeletal:  Pain with any movement of back (+) Slr bil at 90 degrees Motor strength and sensation distally intact  Neurological: She is alert and oriented to person, place, and time. She has normal reflexes.  Skin: Skin is warm and dry.  Psychiatric: She has a normal mood and affect. Her behavior is normal. Judgment and thought content normal.   BP 148/78  Pulse 120  Temp(Src) 98 F (36.7 C) (Oral)  Ht 5' 4.5" (1.638 m)  Wt 145 lb (65.772 kg)  BMI 24.51 kg/m2   Results for orders placed in visit on 03/13/14  POCT UA - MICROSCOPIC  ONLY      Result Value Ref Range   WBC, Ur, HPF, POC 10-12     RBC, urine, microscopic 1-3     Bacteria, U Microscopic many     Mucus, UA neg     Epithelial cells, urine per micros few     Crystals, Ur, HPF, POC neg     Casts, Ur, LPF, POC neg     Yeast, UA neg    POCT URINALYSIS DIPSTICK      Result Value Ref Range   Color, UA orange     Clarity, UA cloudy     Glucose, UA color interference     Bilirubin, UA color interference     Ketones, UA color interference     Blood, UA color interference     Protein, UA color interference    POCT CBC      Result Value Ref Range   WBC 10.9 (*) 4.6 - 10.2 K/uL   Lymph, poc 1.0  0.6 - 3.4   POC LYMPH PERCENT 9.1 (*) 10 - 50 %L   POC Granulocyte 9.8 (*) 2 - 6.9   Granulocyte percent 89.9 (*) 37 - 80 %G   RBC 5.1  4.04 - 5.48 M/uL   Hemoglobin 14.9  12.2 - 16.2 g/dL   HCT, POC 45.9  37.7 - 47.9 %   MCV 89.4  80 - 97 fL   MCH, POC  29.0  27 - 31.2 pg   MCHC 32.4  31.8 - 35.4 g/dL   RDW, POC 11.9     Platelet Count, POC 360.0  142 - 424 K/uL   MPV 6.6  0 - 99.8 fL   Lumbar xray normal-Preliminary reading by Ronnald Collum, FNP  [facility] KUB- Possible ileus-Preliminary reading by Ronnald Collum, FNP  [facility]        Assessment & Plan:   1. Dysuria   2. Epistaxis   3. Bilateral low back pain with sciatica, sciatica laterality unspecified   4. Abdominal pain, other specified site   5. Elevated WBC  To ER Via EMS- patient family afraid to drive her there patient needs abd ct possible ileus Also needs further work up of back pain Also needs to have better evaluation of nose bleed from right nostril  Krystal Hassell Done, FNP

## 2014-03-13 NOTE — ED Notes (Signed)
Pt sent from [facility] due to lower quadrant abdominal pain x 1 week. Pt states dizziness x 2 days. Per EMS, pt has also had multiple nosebleeds recently. Xray of the abdomen showed a large gas pocket and office would like a CT of the abdomen. Denies N/V/D. States LNBM was 2 days ago, which she states is normal for her. Pt states she initially went to the doctor dur to lower back pain, radiating down both legs.

## 2014-03-13 NOTE — Discharge Instructions (Signed)
You have irritated the nerves in your lower back which is creating pain going down the right leg.    Medication for pain, muscle spasm, prednisone. Ice pack. Followup your primary care Dr.

## 2014-03-17 ENCOUNTER — Telehealth: Payer: Self-pay | Admitting: Family Medicine

## 2014-03-17 DIAGNOSIS — R2681 Unsteadiness on feet: Secondary | ICD-10-CM

## 2014-03-20 NOTE — Telephone Encounter (Signed)
Detailed message left for patient that rx for walker will be up front to be picked up

## 2014-03-20 NOTE — Telephone Encounter (Signed)
rx ready for pickup 

## 2014-03-27 ENCOUNTER — Telehealth: Payer: Self-pay | Admitting: Nurse Practitioner

## 2014-03-27 NOTE — Telephone Encounter (Signed)
ntbs before getting refill

## 2014-03-28 ENCOUNTER — Telehealth: Payer: Self-pay | Admitting: Family Medicine

## 2014-03-28 ENCOUNTER — Other Ambulatory Visit: Payer: Self-pay | Admitting: Family Medicine

## 2014-03-28 ENCOUNTER — Other Ambulatory Visit: Payer: Self-pay | Admitting: *Deleted

## 2014-03-28 MED ORDER — TRAMADOL HCL 50 MG PO TABS
50.0000 mg | ORAL_TABLET | Freq: Two times a day (BID) | ORAL | Status: DC | PRN
Start: 2014-03-28 — End: 2014-03-28

## 2014-03-28 MED ORDER — TRAMADOL HCL 50 MG PO TABS: 50.0000 mg | ORAL_TABLET | Freq: Two times a day (BID) | ORAL | Status: DC | PRN

## 2014-03-28 NOTE — Telephone Encounter (Signed)
Wants rf on pain meds  Done- dwm to sign rx

## 2014-03-28 NOTE — Telephone Encounter (Signed)
Patient is aware 

## 2014-03-28 NOTE — Addendum Note (Signed)
Addended by: Zannie Cove on: 03/28/2014 02:47 PM   Modules accepted: Orders

## 2014-03-28 NOTE — Telephone Encounter (Signed)
Patient aware that she ntbs. Patient states she will call tomorrow to schedule

## 2014-03-28 NOTE — Telephone Encounter (Signed)
See telephone message from 8/24--mmm stated pt ntbs

## 2014-03-28 NOTE — Telephone Encounter (Addendum)
Pt med refill up front and next appt w/ dwm dec. Pt aware tramidol presc. Up front but says that does nto help her pain and she wants oxycodone.  She will call back for appt

## 2014-03-29 ENCOUNTER — Emergency Department (HOSPITAL_COMMUNITY)
Admission: EM | Admit: 2014-03-29 | Discharge: 2014-03-29 | Disposition: A | Discharge status: Home / Self Care | Payer: Medicare Other | Attending: Emergency Medicine | Admitting: Emergency Medicine

## 2014-03-29 ENCOUNTER — Emergency Department (HOSPITAL_COMMUNITY): Payer: Medicare Other

## 2014-03-29 ENCOUNTER — Telehealth: Payer: Self-pay | Admitting: Family Medicine

## 2014-03-29 ENCOUNTER — Encounter (HOSPITAL_COMMUNITY): Payer: Self-pay | Admitting: Emergency Medicine

## 2014-03-29 DIAGNOSIS — M51379 Other intervertebral disc degeneration, lumbosacral region without mention of lumbar back pain or lower extremity pain: Secondary | ICD-10-CM | POA: Insufficient documentation

## 2014-03-29 DIAGNOSIS — Z85828 Personal history of other malignant neoplasm of skin: Secondary | ICD-10-CM | POA: Diagnosis not present

## 2014-03-29 DIAGNOSIS — J449 Chronic obstructive pulmonary disease, unspecified: Secondary | ICD-10-CM | POA: Diagnosis not present

## 2014-03-29 DIAGNOSIS — Z791 Long term (current) use of non-steroidal anti-inflammatories (NSAID): Secondary | ICD-10-CM | POA: Insufficient documentation

## 2014-03-29 DIAGNOSIS — J4489 Other specified chronic obstructive pulmonary disease: Secondary | ICD-10-CM | POA: Diagnosis not present

## 2014-03-29 DIAGNOSIS — M5136 Other intervertebral disc degeneration, lumbar region: Secondary | ICD-10-CM

## 2014-03-29 DIAGNOSIS — M545 Low back pain, unspecified: Secondary | ICD-10-CM | POA: Diagnosis not present

## 2014-03-29 DIAGNOSIS — M479 Spondylosis, unspecified: Secondary | ICD-10-CM | POA: Insufficient documentation

## 2014-03-29 DIAGNOSIS — IMO0002 Reserved for concepts with insufficient information to code with codable children: Secondary | ICD-10-CM | POA: Insufficient documentation

## 2014-03-29 DIAGNOSIS — E785 Hyperlipidemia, unspecified: Secondary | ICD-10-CM | POA: Insufficient documentation

## 2014-03-29 DIAGNOSIS — Z86011 Personal history of benign neoplasm of the brain: Secondary | ICD-10-CM | POA: Insufficient documentation

## 2014-03-29 DIAGNOSIS — Z79899 Other long term (current) drug therapy: Secondary | ICD-10-CM | POA: Diagnosis not present

## 2014-03-29 DIAGNOSIS — Z7982 Long term (current) use of aspirin: Secondary | ICD-10-CM | POA: Insufficient documentation

## 2014-03-29 DIAGNOSIS — Z87891 Personal history of nicotine dependence: Secondary | ICD-10-CM | POA: Insufficient documentation

## 2014-03-29 DIAGNOSIS — M5137 Other intervertebral disc degeneration, lumbosacral region: Secondary | ICD-10-CM | POA: Diagnosis not present

## 2014-03-29 DIAGNOSIS — Z860100 Personal history of colon polyps, unspecified: Secondary | ICD-10-CM | POA: Insufficient documentation

## 2014-03-29 DIAGNOSIS — M47819 Spondylosis without myelopathy or radiculopathy, site unspecified: Secondary | ICD-10-CM

## 2014-03-29 DIAGNOSIS — Z8719 Personal history of other diseases of the digestive system: Secondary | ICD-10-CM | POA: Diagnosis not present

## 2014-03-29 DIAGNOSIS — Z853 Personal history of malignant neoplasm of breast: Secondary | ICD-10-CM | POA: Insufficient documentation

## 2014-03-29 DIAGNOSIS — M47817 Spondylosis without myelopathy or radiculopathy, lumbosacral region: Secondary | ICD-10-CM | POA: Diagnosis not present

## 2014-03-29 MED ORDER — OXYCODONE-ACETAMINOPHEN 5-325 MG PO TABS
1.0000 | ORAL_TABLET | Freq: Once | ORAL | Status: AC
  Administered 2014-03-29: 1 via ORAL
  Filled 2014-03-29: qty 1

## 2014-03-29 MED ORDER — OXYCODONE-ACETAMINOPHEN 5-325 MG PO TABS: 2.0000 | ORAL_TABLET | ORAL | Status: DC | PRN

## 2014-03-29 MED ORDER — METHYLPREDNISOLONE 4 MG PO KIT: PACK | ORAL | Status: DC

## 2014-03-29 MED ORDER — MELOXICAM 7.5 MG PO TABS: 7.5000 mg | ORAL_TABLET | Freq: Every day | ORAL | Status: DC

## 2014-03-29 NOTE — Discharge Instructions (Signed)
Back Pain, Adult Back pain is very common. The pain often gets better over time. The cause of back pain is usually not dangerous. Most people can learn to manage their back pain on their own.  HOME CARE   Stay active. Start with short walks on flat ground if you can. Try to walk farther each day.  Do not sit, drive, or stand in one place for more than 30 minutes. Do not stay in bed.  Do not avoid exercise or work. Activity can help your back heal faster.  Be careful when you bend or lift an object. Bend at your knees, keep the object close to you, and do not twist.  Sleep on a firm mattress. Lie on your side, and bend your knees. If you lie on your back, put a pillow under your knees.  Only take medicines as told by your doctor.  Put ice on the injured area.  Put ice in a plastic bag.  Place a towel between your skin and the bag.  Leave the ice on for 15-20 minutes, 03-04 times a day for the first 2 to 3 days. After that, you can switch between ice and heat packs.  Ask your doctor about back exercises or massage.  Avoid feeling anxious or stressed. Find good ways to deal with stress, such as exercise. GET HELP RIGHT AWAY IF:   Your pain does not go away with rest or medicine.  Your pain does not go away in 1 week.  You have new problems.  You do not feel well.  The pain spreads into your legs.  You cannot control when you poop (bowel movement) or pee (urinate).  Your arms or legs feel weak or lose feeling (numbness).  You feel sick to your stomach (nauseous) or throw up (vomit).  You have belly (abdominal) pain.  You feel like you may pass out (faint). MAKE SURE YOU:   Understand these instructions.  Will watch your condition.  Will get help right away if you are not doing well or get worse. Document Released: 01/07/2008 Document Revised: 10/13/2011 Document Reviewed: 11/22/2013 [facility] Patient Information 2015 [place], [place]. This information is not intended  to replace advice given to you by your health care provider. Make sure you discuss any questions you have with your health care provider.  Degenerative Disk Disease Degenerative disk disease is a condition caused by the changes that occur in the cushions of the backbone (spinal disks) as you grow older. Spinal disks are soft and compressible disks located between the bones of the spine (vertebrae). They act like shock absorbers. Degenerative disk disease can affect the whole spine. However, the neck and lower back are most commonly affected. Many changes can occur in the spinal disks with aging, such as:  The spinal disks may dry and shrink.  Small tears may occur in the tough, outer covering of the disk (annulus).  The disk space may become smaller due to loss of water.  Abnormal growths in the bone (spurs) may occur. This can put pressure on the nerve roots exiting the spinal canal, causing pain.  The spinal canal may become narrowed. CAUSES  Degenerative disk disease is a condition caused by the changes that occur in the spinal disks with aging. The exact cause is not known, but there is a genetic basis for many patients. Degenerative changes can occur due to loss of fluid in the disk. This makes the disk thinner and reduces the space between the backbones. Small  cracks can develop in the outer layer of the disk. This can lead to the breakdown of the disk. You are more likely to get degenerative disk disease if you are overweight. Smoking cigarettes and doing heavy work such as weightlifting can also increase your risk of this condition. Degenerative changes can start after a sudden injury. Growth of bone spurs can compress the nerve roots and cause pain.  SYMPTOMS  The symptoms vary from person to person. Some people may have no pain, while others have severe pain. The pain may be so severe that it can limit your activities. The location of the pain depends on the part of your backbone that is  affected. You will have neck or arm pain if a disk in the neck area is affected. You will have pain in your back, buttocks, or legs if a disk in the lower back is affected. The pain becomes worse while bending, reaching up, or with twisting movements. The pain may start gradually and then get worse as time passes. It may also start after a major or minor injury. You may feel numbness or tingling in the arms or legs.  DIAGNOSIS  Your caregiver will ask you about your symptoms and about activities or habits that may cause the pain. He or she may also ask about any injuries, diseases, or treatments you have had earlier. Your caregiver will examine you to check for the range of movement that is possible in the affected area, to check for strength in your extremities, and to check for sensation in the areas of the arms and legs supplied by different nerve roots. An X-ray of the spine may be taken. Your caregiver may suggest other imaging tests, such as magnetic resonance imaging (MRI), if needed.  TREATMENT  Treatment includes rest, modifying your activities, and applying ice and heat. Your caregiver may prescribe medicines to reduce your pain and may ask you to do some exercises to strengthen your back. In some cases, you may need surgery. You and your caregiver will decide on the treatment that is best for you. HOME CARE INSTRUCTIONS   Follow proper lifting and walking techniques as advised by your caregiver.  Maintain good posture.  Exercise regularly as advised.  Perform relaxation exercises.  Change your sitting, standing, and sleeping habits as advised. Change positions frequently.  Lose weight as advised.  Stop smoking if you smoke.  Wear supportive footwear. SEEK MEDICAL CARE IF:  Your pain does not go away within 1 to 4 weeks. SEEK IMMEDIATE MEDICAL CARE IF:   Your pain is severe.  You notice weakness in your arms, hands, or legs.  You begin to lose control of your bladder or bowel  movements. MAKE SURE YOU:   Understand these instructions.  Will watch your condition.  Will get help right away if you are not doing well or get worse. Document Released: 05/18/2007 Document Revised: 10/13/2011 Document Reviewed: 11/22/2013 [facility] Patient Information 2015 [place], [place]. This information is not intended to replace advice given to you by your health care provider. Make sure you discuss any questions you have with your health care provider.

## 2014-03-29 NOTE — Telephone Encounter (Signed)
Offered patient an appt for tomorrow with Dr Laurance Flatten as requested. Krystal [name] states that Krystal [name] cannot go another night in such terrible pain and Krystal [name] wont wait for the appt. Krystal [name] states that Krystal [name] will go to the ER. Advised patient if Krystal [name] needs any further assistance to call us

## 2014-03-29 NOTE — ED Provider Notes (Signed)
Patient seen and evaluated. Discussed with Evalee Jefferson PA. Complains primarily right sided low lumbar back pain. Has no frank radicular symptoms. Has no neurological loss on exam. MRI shows facet degeneration and disc degeneration and foraminal narrowing but no obvious nerve root or spinal encroachment. No stenosis.  Plan: Discharge home, pain medicine, short course steroids, daily anti-inflammatory, primary care followup.  Tanna Furry, MD 03/29/14 [phone]

## 2014-03-29 NOTE — ED Notes (Signed)
Pt with c/o back pain. Hx of back pain and recently seen and treated for the same. Pt reports she is here due to her pain medication running out 2 days ago and PCP will not refill until her f/u appt tomorrow.

## 2014-03-29 NOTE — ED Provider Notes (Signed)
CSN: [id number]     Arrival date & time 03/29/14  1346 History   First MD Initiated Contact with Patient 03/29/14 1550     No chief complaint on file.    (Consider location/radiation/quality/duration/timing/severity/associated sxs/prior Treatment) The history is provided by the patient.   Krystal [name] is a 73 y.o. female presenting with persistent right lower back pain which radiates into her right lateral leg to her ankle and has been present for the past month. She suspects she injured her back while helping transfer her mother whom she cares for with the help of siblings.  She was seen here on August 10 for the same pain and was treated with percocet and prednisone which helped.  She ran out of her percocet 2 days ago and now has return of severe pain preventing her from sleeping last night.  She is scheduled to see her pcp tomorrow but could not bear another night of pain.  She denies weakness in her right lower extremity and has had no urinary or bowel incontinence or retention.  She does have a significant history of both breast and skin cancer. She has taken no other home medicines prior to arrival.   Past Medical History  Diagnosis Date  . COPD (chronic obstructive pulmonary disease)   . Hyperlipidemia   . Edema of lower extremity   . Asthma   . Right knee pain   . Breast cancer     right breast  . Brain tumor     1986  . Colon polyps   . Hernia     hiatal   Past Surgical History  Procedure Laterality Date  . Mastectomy      right breast  . Partial hysterectomy    . Craniectomy / craniotomy for excision of brain tumor      left side brain  . Skin cancer excision      under left eye  . Colonoscopy    . Polypectomy     Family History  Problem Relation Age of Onset  . Heart disease Mother 25    stroke   . Heart disease Sister   . Hypertension Sister   . Diabetes Sister   . Heart disease Brother   . Hyperlipidemia Brother   . Diabetes Brother   . Heart  disease Sister   . Hypertension Sister   . Diabetes Sister   . Diabetes Sister   . Hypertension Sister   . Diabetes Sister   . Hypertension Sister   . Diabetes Brother   . Hyperlipidemia Brother   . Colon cancer Neg Hx   . Rectal cancer Neg Hx   . Stomach cancer Neg Hx   . Esophageal cancer Neg Hx   . Cancer Father     lung  . Heart attack Father   . Diabetes Brother   . Heart disease Brother   . Hyperlipidemia Brother   . Diabetes Brother    History  Substance Use Topics  . Smoking status: Former Smoker    Types: Cigarettes    Quit date: 02/09/1961  . Smokeless tobacco: Never Used  . Alcohol Use: No   OB History   Grav Para Term Preterm Abortions TAB SAB Ect Mult Living                 Review of Systems  Constitutional: Negative for fever.  Respiratory: Negative for shortness of breath.   Cardiovascular: Negative for chest pain and leg swelling.  Gastrointestinal: Negative for  nausea, vomiting, abdominal pain, constipation and abdominal distention.  Genitourinary: Negative for dysuria, urgency, frequency, flank pain and difficulty urinating.  Musculoskeletal: Positive for back pain. Negative for gait problem, joint swelling, neck pain and neck stiffness.  Skin: Negative for rash.  Neurological: Negative for weakness and numbness.      Allergies  Actonel; Fosamax; Mevacor; and Miacalcin  Home Medications   Prior to Admission medications   Medication Sig Start Date End Date Taking? Authorizing Provider  albuterol (PROAIR HFA) 108 (90 BASE) MCG/ACT inhaler Inhale 1 puff into the lungs 4 (four) times daily as needed for wheezing or shortness of breath.   Yes Historical Provider, MD  aspirin EC 81 MG tablet Take 81 mg by mouth at bedtime.   Yes Historical Provider, MD  cetirizine (ZYRTEC) 10 MG tablet Take 10 mg by mouth at bedtime.    Yes Historical Provider, MD  Cholecalciferol (VITAMIN D) 2000 UNITS CAPS Take 1 capsule by mouth at bedtime.   Yes Historical  Provider, MD  diazepam (VALIUM) 5 MG tablet Take 5 mg by mouth every 6 (six) hours as needed for anxiety.   Yes Historical Provider, MD  esomeprazole (NEXIUM) 40 MG capsule Take 40 mg by mouth at bedtime.   Yes Historical Provider, MD  ferrous sulfate 325 (65 FE) MG tablet Take 325 mg by mouth at bedtime.   Yes Historical Provider, MD  fish oil-omega-3 fatty acids 1000 MG capsule Take 1 g by mouth at bedtime.    Yes Historical Provider, MD  furosemide (LASIX) 20 MG tablet Take 20 mg by mouth daily as needed for fluid.   Yes Historical Provider, MD  ipratropium-albuterol (DUONEB) 0.5-2.5 (3) MG/3ML SOLN Take 3 mLs by nebulization every 6 (six) hours as needed. Dx:  Asthma 493.90 07/13/13  Yes Mary-Margaret Hassell Done, FNP  montelukast (SINGULAIR) 10 MG tablet Take 10 mg by mouth at bedtime.   Yes Historical Provider, MD  Multiple Vitamins-Minerals (PRESERVISION/LUTEIN PO) Take 1 capsule by mouth at bedtime.    Yes Historical Provider, MD  oxyCODONE-acetaminophen (PERCOCET/ROXICET) 5-325 MG per tablet Take 1 tablet by mouth every 4 (four) hours as needed for severe pain (pain).   Yes Historical Provider, MD  Phenazopyridine HCl (AZO TABS PO) Take 1 tablet by mouth as needed (for bladder pain).   Yes Historical Provider, MD  predniSONE (DELTASONE) 10 MG tablet Take 2 tablets (20 mg total) by mouth daily. 03/13/14  Yes Nat Christen, MD  simvastatin (ZOCOR) 20 MG tablet Take 20 mg by mouth at bedtime.   Yes Historical Provider, MD  theophylline (THEODUR) 200 MG 12 hr tablet Take 200 mg by mouth at bedtime.   Yes Historical Provider, MD  meloxicam (MOBIC) 7.5 MG tablet Take 1 tablet (7.5 mg total) by mouth daily. 03/29/14   Tanna Furry, MD  methylPREDNISolone (MEDROL DOSEPAK) 4 MG tablet 6 po on day 1, decrease by 1 tab per day 03/29/14   Tanna Furry, MD  oxyCODONE-acetaminophen (PERCOCET/ROXICET) 5-325 MG per tablet Take 2 tablets by mouth every 4 (four) hours as needed. 03/29/14   Tanna Furry, MD   BP 129/79   Pulse 115  Temp(Src) 97.9 F (36.6 C) (Oral)  Resp 16  Ht 5\' 4"  (1.626 m)  Wt 145 lb (65.772 kg)  BMI 24.88 kg/m2  SpO2 100% Physical Exam  Nursing note and vitals reviewed. Constitutional: She appears well-developed and well-nourished.  HENT:  Head: Normocephalic.  Eyes: Conjunctivae are normal.  Neck: Normal range of motion. Neck supple.  Cardiovascular:  Normal rate and intact distal pulses.   Pedal pulses normal.  Pulmonary/Chest: Effort normal.  Abdominal: Soft. Bowel sounds are normal. She exhibits no distension and no mass.  Musculoskeletal: Normal range of motion. She exhibits no edema.       Lumbar back: She exhibits bony tenderness. She exhibits no swelling, no edema, no deformity and no spasm.  Tender to palpation along the right paralumbar spine and into right flank.  Neurological: She is alert. She has normal strength. She displays no atrophy and no tremor. No sensory deficit. Gait normal.  Reflex Scores:      Patellar reflexes are 2+ on the right side and 2+ on the left side.      Achilles reflexes are 2+ on the right side and 2+ on the left side. No strength deficit noted in hip and knee flexor and extensor muscle groups.  Ankle flexion and extension intact.  Skin: Skin is warm and dry.  Psychiatric: She has a normal mood and affect.    ED Course  Procedures (including critical care time)  Percocet given.  Pt with mild tachycardia but improving with improvement in pain.  Pt was discussed with Dr. Jeneen Rinks who also evaluated patient.  We will obtain an MRI tonight given radicular pain in anticipation of possible need for neurosurgical intervention.     Labs Review Labs Reviewed - No data to display  Imaging Review Mr Lumbar Spine Wo Contrast  03/29/2014   CLINICAL DATA:  Low back pain and right leg pain.  EXAM: MRI LUMBAR SPINE WITHOUT CONTRAST  TECHNIQUE: Multiplanar, multisequence MR imaging of the lumbar spine was performed. No intravenous contrast was  administered.  COMPARISON:  CT abdomen 03/13/2014  FINDINGS: There is no abnormality at L2-3 or above. The discs are normal. The canal and foramina are widely patent. The distal cord and conus are normal with the conus tip at upper L2.  L3-4: No disc pathology. Minimal facet and ligamentous hypertrophy. No stenosis.  L4-5: Mild facet degeneration with 1 mm of anterolisthesis. Desiccation and minimal bulging of the disc. No compressive stenosis.  L5-S1: Chronic disc degeneration with loss of disc height. Endplate osteophytes and mild bulging of the disc. Minimal facet degeneration. No slippage. No central canal stenosis. Mild foraminal narrowing without apparent compression of the exiting L5 nerve roots.  IMPRESSION: No apparent spinal stenosis or focal neural compression.  L4-5 shows facet degeneration bilaterally with 1 mm of anterolisthesis. The facet arthropathy could be painful. Is the patient's pain consistent with facet syndrome?  L5-S1 shows chronic disc degeneration with loss of disc height and mild foraminal narrowing bilaterally because of osteophytic encroachment, but there is no apparent compression of the exiting L5 nerve roots.   Electronically Signed   By: Nelson Chimes M.D.   On: 03/29/2014 17:51     EKG Interpretation None      MDM   Final diagnoses:  Right-sided low back pain without sciatica  Degeneration of intervertebral disc of lumbar region  Spinal arthritis   Dispo per Dr. Denton Brick Thia Olesen, PA-C 03/29/14 2130

## 2014-03-31 ENCOUNTER — Other Ambulatory Visit: Payer: Self-pay | Admitting: Family Medicine

## 2014-04-01 NOTE — ED Provider Notes (Signed)
Medical screening examination/treatment/procedure(s) were conducted as a shared visit with non-physician practitioner(s) and myself.  I personally evaluated the patient during the encounter.   EKG Interpretation None      Pt seen and evaluated.  C/o Right greater than lt Lumbar Pain.  MRI without acute HNP, nerve impingement, or stenosis.  Pt with improvement after meds.  Exam shows tenderness in Para lumbar muscualture.   Pt prefers LLD position.  Intact strength, sensation, and DTRs. Plan is Dc c Pain meds, NSAID, MM Relaxants.  Tanna Furry, MD 04/01/14 [id number]

## 2014-04-11 ENCOUNTER — Encounter: Payer: Self-pay | Admitting: Family Medicine

## 2014-04-11 ENCOUNTER — Telehealth: Payer: Self-pay | Admitting: Family Medicine

## 2014-04-11 ENCOUNTER — Ambulatory Visit (INDEPENDENT_AMBULATORY_CARE_PROVIDER_SITE_OTHER): Payer: Medicare Other | Admitting: Family Medicine

## 2014-04-11 VITALS — BP 114/67 | HR 104 | Temp 97.3°F | Ht 64.5 in | Wt 139.0 lb

## 2014-04-11 DIAGNOSIS — M545 Low back pain, unspecified: Secondary | ICD-10-CM | POA: Diagnosis not present

## 2014-04-11 MED ORDER — KETOROLAC TROMETHAMINE 30 MG/ML IJ SOLN
30.0000 mg | Freq: Once | INTRAMUSCULAR | Status: AC
  Administered 2014-04-11: 30 mg via INTRAMUSCULAR

## 2014-04-11 NOTE — Progress Notes (Signed)
   Subjective:    Patient ID: Krystal [name], female    DOB: [birth year], 73 y.o.   MRN: [id number]  HPI This 73 y.o. female presents for evaluation of back pain.  She went to the ED the other night and  Had MRI of LS spine and it showed DDD but no stenosis or nerve impingement.  She was tx'd with  Steroid taper.  She states she is in severe pain.  She has a rx of tramadol waiting up front for her.   Review of Systems C/o back pain No chest pain, SOB, HA, dizziness, vision change, N/V, diarrhea, constipation, dysuria, urinary urgency or frequency or rash.     Objective:   Physical Exam Vital signs noted  Well developed well nourished female.  HEENT - Head atraumatic Normocephalic                Eyes - PERRLA, Conjuctiva - clear Sclera- Clear EOMI                Ears - EAC's Wnl TM's Wnl Gross Hearing WNL                Nose - Nares patent                 Throat - oropharanx wnl Respiratory - Lungs CTA bilateral Cardiac - RRR S1 and S2 without murmur GI - Abdomen soft Nontender and bowel sounds active x 4 Extremities - No edema. Neuro - Grossly intact.       Assessment & Plan:  Back pain at L4-L5 level - Plan: Ambulatory referral to Physical Therapy, ketorolac (TORADOL) 30 MG/ML injection 30 mg  Lysbeth Penner FNP

## 2014-04-11 NOTE — Telephone Encounter (Signed)
Appt given for today 

## 2014-04-24 ENCOUNTER — Telehealth: Payer: Self-pay | Admitting: Family Medicine

## 2014-04-24 NOTE — Telephone Encounter (Signed)
Patient states she will contact son and see when he can bring her and will call us back

## 2014-04-26 ENCOUNTER — Encounter: Payer: Self-pay | Admitting: Nurse Practitioner

## 2014-04-26 ENCOUNTER — Ambulatory Visit (INDEPENDENT_AMBULATORY_CARE_PROVIDER_SITE_OTHER): Payer: Medicare Other | Admitting: Nurse Practitioner

## 2014-04-26 VITALS — BP 108/61 | HR 110 | Temp 97.0°F | Ht 64.5 in | Wt 138.6 lb

## 2014-04-26 DIAGNOSIS — M545 Low back pain, unspecified: Secondary | ICD-10-CM | POA: Diagnosis not present

## 2014-04-26 MED ORDER — METHYLPREDNISOLONE ACETATE 80 MG/ML IJ SUSP
80.0000 mg | Freq: Once | INTRAMUSCULAR | Status: AC
Start: 2014-04-26 — End: 2014-04-26
  Administered 2014-04-26: 80 mg via INTRAMUSCULAR

## 2014-04-26 MED ORDER — MELOXICAM 7.5 MG PO TABS: 7.5000 mg | ORAL_TABLET | Freq: Every day | ORAL | Status: DC

## 2014-04-26 NOTE — Patient Instructions (Signed)
Back Pain, Adult Low back pain is very common. About 1 in 5 people have back pain.The cause of low back pain is rarely dangerous. The pain often gets better over time.About half of people with a sudden onset of back pain feel better in just 2 weeks. About 8 in 10 people feel better by 6 weeks.  CAUSES Some common causes of back pain include:  Strain of the muscles or ligaments supporting the spine.  Wear and tear (degeneration) of the spinal discs.  Arthritis.  Direct injury to the back. DIAGNOSIS Most of the time, the direct cause of low back pain is not known.However, back pain can be treated effectively even when the exact cause of the pain is unknown.Answering your caregiver's questions about your overall health and symptoms is one of the most accurate ways to make sure the cause of your pain is not dangerous. If your caregiver needs more information, he or she may order lab work or imaging tests (X-rays or MRIs).However, even if imaging tests show changes in your back, this usually does not require surgery. HOME CARE INSTRUCTIONS For many people, back pain returns.Since low back pain is rarely dangerous, it is often a condition that people can learn to manageon their own.   Remain active. It is stressful on the back to sit or stand in one place. Do not sit, drive, or stand in one place for more than 30 minutes at a time. Take short walks on level surfaces as soon as pain allows.Try to increase the length of time you walk each day.  Do not stay in bed.Resting more than 1 or 2 days can delay your recovery.  Do not avoid exercise or work.Your body is made to move.It is not dangerous to be active, even though your back may hurt.Your back will likely heal faster if you return to being active before your pain is gone.  Pay attention to your body when you bend and lift. Many people have less discomfortwhen lifting if they bend their knees, keep the load close to their bodies,and  avoid twisting. Often, the most comfortable positions are those that put less stress on your recovering back.  Find a comfortable position to sleep. Use a firm mattress and lie on your side with your knees slightly bent. If you lie on your back, put a pillow under your knees.  Only take over-the-counter or prescription medicines as directed by your caregiver. Over-the-counter medicines to reduce pain and inflammation are often the most helpful.Your caregiver may prescribe muscle relaxant drugs.These medicines help dull your pain so you can more quickly return to your normal activities and healthy exercise.  Put ice on the injured area.  Put ice in a plastic bag.  Place a towel between your skin and the bag.  Leave the ice on for 15-20 minutes, 03-04 times a day for the first 2 to 3 days. After that, ice and heat may be alternated to reduce pain and spasms.  Ask your caregiver about trying back exercises and gentle massage. This may be of some benefit.  Avoid feeling anxious or stressed.Stress increases muscle tension and can worsen back pain.It is important to recognize when you are anxious or stressed and learn ways to manage it.Exercise is a great option. SEEK MEDICAL CARE IF:  You have pain that is not relieved with rest or medicine.  You have pain that does not improve in 1 week.  You have new symptoms.  You are generally not feeling well. SEEK   IMMEDIATE MEDICAL CARE IF:   You have pain that radiates from your back into your legs.  You develop new bowel or bladder control problems.  You have unusual weakness or numbness in your arms or legs.  You develop nausea or vomiting.  You develop abdominal pain.  You feel faint. Document Released: 07/21/2005 Document Revised: 01/20/2012 Document Reviewed: 11/22/2013 Exit[employer] Patient Information 2015 [employer]. This information is not intended to replace advice given to you by your health care provider. Make sure you  discuss any questions you have with your health care provider.  

## 2014-04-26 NOTE — Progress Notes (Signed)
   Subjective:    Patient ID: Krystal [name], female    DOB: 1940/10/09, 73 y.o.   MRN: [id number]  HPI Patient is C/O back pain. She was seen on 04/11/14 and was given toradol 30mg  inj and refered to physical therapy but has not gone yet. She stated her pain is not getting any better, She reported her pain is a 9/10.    Review of Systems  Constitutional: Negative.   HENT: Negative.   Eyes: Negative.   Respiratory: Negative.   Cardiovascular: Negative.   Gastrointestinal: Negative.   Endocrine: Negative.   Genitourinary: Negative.   Musculoskeletal: Positive for back pain.  Skin: Negative.   Allergic/Immunologic: Negative.   Neurological: Negative.   Hematological: Negative.   Psychiatric/Behavioral: Negative.        Objective:   Physical Exam  Constitutional: She is oriented to person, place, and time. She appears well-developed and well-nourished.  HENT:  Head: Normocephalic.  Eyes: Pupils are equal, round, and reactive to light.  Neck: Normal range of motion.  Cardiovascular: Normal rate and regular rhythm.   Pulmonary/Chest: Effort normal and breath sounds normal.  Musculoskeletal:  Back pain.   Neurological: She is alert and oriented to person, place, and time.  Skin: Skin is warm.  Psychiatric: She has a normal mood and affect. Her behavior is normal.    BP 108/61  Pulse 110  Temp(Src) 97 F (36.1 C) (Oral)  Ht 5' 4.5" (1.638 m)  Wt 138 lb 9.6 oz (62.869 kg)  BMI 23.43 kg/m2       Assessment & Plan:   1. Bilateral low back pain without sciatica    Meds ordered this encounter  Medications  . methylPREDNISolone acetate (DEPO-MEDROL) injection 80 mg    Sig:   . meloxicam (MOBIC) 7.5 MG tablet    Sig: Take 1 tablet (7.5 mg total) by mouth daily.    Dispense:  30 tablet    Refill:  1    Order Specific Question:  Supervising Provider    Answer:  Chipper Herb [[id number]]   Rest Moist heat no heavy lifting If steroid does not help then will do  referral for PT  Mary-Margaret Hassell Done, FNP

## 2014-05-05 ENCOUNTER — Encounter (HOSPITAL_COMMUNITY): Payer: Self-pay | Admitting: Emergency Medicine

## 2014-05-05 ENCOUNTER — Emergency Department (HOSPITAL_COMMUNITY): Payer: Medicare Other

## 2014-05-05 ENCOUNTER — Ambulatory Visit (INDEPENDENT_AMBULATORY_CARE_PROVIDER_SITE_OTHER): Payer: Medicare Other | Admitting: Family Medicine

## 2014-05-05 ENCOUNTER — Emergency Department (HOSPITAL_COMMUNITY)
Admission: EM | Admit: 2014-05-05 | Discharge: 2014-05-05 | Disposition: A | Discharge status: Home / Self Care | Payer: Medicare Other | Attending: Emergency Medicine | Admitting: Emergency Medicine

## 2014-05-05 VITALS — BP 82/52 | HR 72 | Temp 97.5°F | Ht 64.5 in | Wt 138.0 lb

## 2014-05-05 DIAGNOSIS — Z7982 Long term (current) use of aspirin: Secondary | ICD-10-CM | POA: Insufficient documentation

## 2014-05-05 DIAGNOSIS — R918 Other nonspecific abnormal finding of lung field: Secondary | ICD-10-CM | POA: Diagnosis not present

## 2014-05-05 DIAGNOSIS — Z86011 Personal history of benign neoplasm of the brain: Secondary | ICD-10-CM | POA: Diagnosis not present

## 2014-05-05 DIAGNOSIS — E785 Hyperlipidemia, unspecified: Secondary | ICD-10-CM | POA: Diagnosis not present

## 2014-05-05 DIAGNOSIS — Z8719 Personal history of other diseases of the digestive system: Secondary | ICD-10-CM | POA: Insufficient documentation

## 2014-05-05 DIAGNOSIS — R531 Weakness: Secondary | ICD-10-CM | POA: Insufficient documentation

## 2014-05-05 DIAGNOSIS — R42 Dizziness and giddiness: Secondary | ICD-10-CM | POA: Diagnosis not present

## 2014-05-05 DIAGNOSIS — Z791 Long term (current) use of non-steroidal anti-inflammatories (NSAID): Secondary | ICD-10-CM | POA: Diagnosis not present

## 2014-05-05 DIAGNOSIS — I471 Supraventricular tachycardia: Secondary | ICD-10-CM | POA: Insufficient documentation

## 2014-05-05 DIAGNOSIS — Z87891 Personal history of nicotine dependence: Secondary | ICD-10-CM | POA: Insufficient documentation

## 2014-05-05 DIAGNOSIS — R000 Tachycardia, unspecified: Secondary | ICD-10-CM | POA: Diagnosis not present

## 2014-05-05 DIAGNOSIS — Z86010 Personal history of colonic polyps: Secondary | ICD-10-CM | POA: Diagnosis not present

## 2014-05-05 DIAGNOSIS — E876 Hypokalemia: Secondary | ICD-10-CM | POA: Diagnosis not present

## 2014-05-05 DIAGNOSIS — Z79899 Other long term (current) drug therapy: Secondary | ICD-10-CM | POA: Insufficient documentation

## 2014-05-05 DIAGNOSIS — Z853 Personal history of malignant neoplasm of breast: Secondary | ICD-10-CM | POA: Diagnosis not present

## 2014-05-05 DIAGNOSIS — R079 Chest pain, unspecified: Secondary | ICD-10-CM | POA: Diagnosis not present

## 2014-05-05 DIAGNOSIS — J449 Chronic obstructive pulmonary disease, unspecified: Secondary | ICD-10-CM | POA: Diagnosis not present

## 2014-05-05 DIAGNOSIS — R404 Transient alteration of awareness: Secondary | ICD-10-CM | POA: Diagnosis not present

## 2014-05-05 DIAGNOSIS — S0990XA Unspecified injury of head, initial encounter: Secondary | ICD-10-CM | POA: Diagnosis not present

## 2014-05-05 LAB — BASIC METABOLIC PANEL
ANION GAP: 14 (ref 5–15)
BUN: 20 mg/dL (ref 6–23)
CO2: 27 mEq/L (ref 19–32)
CREATININE: 0.71 mg/dL (ref 0.50–1.10)
Calcium: 9.2 mg/dL (ref 8.4–10.5)
Chloride: 101 mEq/L (ref 96–112)
GFR calc non Af Amer: 82 mL/min — ABNORMAL LOW (ref >90)
Glucose, Bld: 148 mg/dL — ABNORMAL HIGH (ref 70–99)
POTASSIUM: 3.3 meq/L — AB (ref 3.7–5.3)
Sodium: 142 mEq/L (ref 137–147)

## 2014-05-05 LAB — CBC WITH DIFFERENTIAL/PLATELET
BASOS ABS: 0 10*3/uL (ref 0.0–0.1)
BASOS PCT: 0 % (ref 0–1)
Eosinophils Absolute: 0.1 10*3/uL (ref 0.0–0.7)
Eosinophils Relative: 0 % (ref 0–5)
HCT: 42.6 % (ref 36.0–46.0)
Hemoglobin: 14.4 g/dL (ref 12.0–15.0)
Lymphocytes Relative: 6 % — ABNORMAL LOW (ref 12–46)
Lymphs Abs: 0.8 10*3/uL (ref 0.7–4.0)
MCH: 29.9 pg (ref 26.0–34.0)
MCHC: 33.8 g/dL (ref 30.0–36.0)
MCV: 88.4 fL (ref 78.0–100.0)
MONO ABS: 0.7 10*3/uL (ref 0.1–1.0)
Monocytes Relative: 5 % (ref 3–12)
NEUTROS PCT: 89 % — AB (ref 43–77)
Neutro Abs: 12.9 10*3/uL — ABNORMAL HIGH (ref 1.7–7.7)
Platelets: 194 10*3/uL (ref 150–400)
RBC: 4.82 MIL/uL (ref 3.87–5.11)
RDW: 13.3 % (ref 11.5–15.5)
WBC: 14.5 10*3/uL — ABNORMAL HIGH (ref 4.0–10.5)

## 2014-05-05 LAB — TROPONIN I

## 2014-05-05 MED ORDER — DILTIAZEM HCL 30 MG PO TABS: 30.0000 mg | ORAL_TABLET | Freq: Two times a day (BID) | ORAL | Status: DC | PRN

## 2014-05-05 MED ORDER — TRAMADOL HCL 50 MG PO TABS: 50.0000 mg | ORAL_TABLET | Freq: Four times a day (QID) | ORAL | Status: DC | PRN

## 2014-05-05 MED ORDER — POTASSIUM CHLORIDE CRYS ER 20 MEQ PO TBCR
40.0000 meq | EXTENDED_RELEASE_TABLET | Freq: Once | ORAL | Status: AC
  Administered 2014-05-05: 40 meq via ORAL
  Filled 2014-05-05: qty 2

## 2014-05-05 MED ORDER — SODIUM CHLORIDE 0.9 % IV BOLUS (SEPSIS)
1000.0000 mL | Freq: Once | INTRAVENOUS | Status: AC
  Administered 2014-05-05: 1000 mL via INTRAVENOUS

## 2014-05-05 NOTE — ED Notes (Signed)
Pt from [facility], per EMS - pt was in SVT upon their arrival to office.  EMS gave Adenosine 6mg  IV x 2 in route with conversion to A-fib.  Pt initially hypotensive with systolic pressure in 44W, EMS gave 1L of fluid, converted to NSR with normal bp.  Pt denies sob/cp at this time.

## 2014-05-05 NOTE — Progress Notes (Addendum)
Communicated with patient regarding walker.  Dr. Winfred Leeds informed CM that patient will need a walker/4 wheel walker with seat. Contacted Emma [phone] at [place] explained have available at [facility] store to pick up today.  Family and patient not able to pick up from store today.  Store closes at 5:00 pm.  Patient rather have delivered on Saturday.  Address for delivery [address] [place], [place] [address]  Phone for contact [phone].  Received handwritten prescription for wheelchair from Dr. Winfred Leeds an order placed in system also.  Contacted and left message to [facility] regarding walker and information faxed to [place].   Provided the prescription and contact number for [facility] to Ms. Hassell Done.

## 2014-05-05 NOTE — ED Provider Notes (Signed)
CSN: [id number]     Arrival date & time 05/05/14  1234 History  This chart was scribed for Orlie Dakin, MD by Molli Posey, ED Scribe. This patient was seen in room APA04/APA04 and the patient's care was started 12:40 PM.    Chief Complaint  Patient presents with  . svt     The history is provided by the patient and the EMS personnel. No language interpreter was used.   HPI Comments: Krystal [name] is a 73 y.o. female who presents to the Emergency Department per EMS patient was at [place] and was in SVT upon arrival at their office. EMS gave Adenosine 6mg  IV x 2 in route with conversion to A-fib. Patient was hypotensive with systolic pressure in 85O, EMS gave 1L of fluid, converted to NSR with normal BP. She reports that she has been falling for the past week but denies syncope. She complains of rib and back pain that she states has been intermittent for the past month. She reports she feels better currently but is light headed. She states she has not had a BM in the past week. The patient reports she is on medication for her allergies and asthma. She reports no history of smoking or drinking. She reports lightheadedness on standing.   Past Medical History  Diagnosis Date  . COPD (chronic obstructive pulmonary disease)   . Hyperlipidemia   . Edema of lower extremity   . Asthma   . Right knee pain   . Breast cancer     right breast  . Brain tumor     1986  . Colon polyps   . Hernia     hiatal   Past Surgical History  Procedure Laterality Date  . Mastectomy      right breast  . Partial hysterectomy    . Craniectomy / craniotomy for excision of brain tumor      left side brain  . Skin cancer excision      under left eye  . Colonoscopy    . Polypectomy     Family History  Problem Relation Age of Onset  . Heart disease Mother 69    stroke   . Heart disease Sister   . Hypertension Sister   . Diabetes Sister   . Heart disease Brother   .  Hyperlipidemia Brother   . Diabetes Brother   . Heart disease Sister   . Hypertension Sister   . Diabetes Sister   . Diabetes Sister   . Hypertension Sister   . Diabetes Sister   . Hypertension Sister   . Diabetes Brother   . Hyperlipidemia Brother   . Colon cancer Neg Hx   . Rectal cancer Neg Hx   . Stomach cancer Neg Hx   . Esophageal cancer Neg Hx   . Cancer Father     lung  . Heart attack Father   . Diabetes Brother   . Heart disease Brother   . Hyperlipidemia Brother   . Diabetes Brother    History  Substance Use Topics  . Smoking status: Former Smoker    Types: Cigarettes    Quit date: 02/09/1961  . Smokeless tobacco: Never Used  . Alcohol Use: No   OB History   Grav Para Term Preterm Abortions TAB SAB Ect Mult Living                 Review of Systems  HENT: Negative.   Respiratory: Negative.   Cardiovascular:  Negative.        Bilateral rib pain for several weeks  Gastrointestinal: Negative.   Musculoskeletal: Negative.   Skin: Negative.   Neurological: Positive for weakness and light-headedness. Negative for syncope.  Psychiatric/Behavioral: Negative.   All other systems reviewed and are negative.     Allergies  Actonel; Fosamax; Mevacor; and Miacalcin  Home Medications   Prior to Admission medications   Medication Sig Start Date End Date Taking? Authorizing Provider  albuterol (PROAIR HFA) 108 (90 BASE) MCG/ACT inhaler Inhale 1 puff into the lungs 4 (four) times daily as needed for wheezing or shortness of breath.    Historical Provider, MD  aspirin EC 81 MG tablet Take 81 mg by mouth at bedtime.    Historical Provider, MD  cetirizine (ZYRTEC) 10 MG tablet Take 10 mg by mouth at bedtime.     Historical Provider, MD  Cholecalciferol (VITAMIN D) 2000 UNITS CAPS Take 1 capsule by mouth at bedtime.    Historical Provider, MD  diazepam (VALIUM) 5 MG tablet Take 5 mg by mouth every 6 (six) hours as needed for anxiety.    Historical Provider, MD   esomeprazole (NEXIUM) 40 MG capsule Take 40 mg by mouth at bedtime.    Historical Provider, MD  ferrous sulfate 325 (65 FE) MG tablet Take 325 mg by mouth at bedtime.    Historical Provider, MD  fish oil-omega-3 fatty acids 1000 MG capsule Take 1 g by mouth at bedtime.     Historical Provider, MD  furosemide (LASIX) 20 MG tablet Take 20 mg by mouth daily as needed for fluid.    Historical Provider, MD  ipratropium-albuterol (DUONEB) 0.5-2.5 (3) MG/3ML SOLN Take 3 mLs by nebulization every 6 (six) hours as needed. Dx:  Asthma 493.90 07/13/13   Mary-Margaret Hassell Done, FNP  meloxicam (MOBIC) 7.5 MG tablet Take 1 tablet (7.5 mg total) by mouth daily. 04/26/14   Mary-Margaret Hassell Done, FNP  montelukast (SINGULAIR) 10 MG tablet Take 10 mg by mouth at bedtime.    Historical Provider, MD  Multiple Vitamins-Minerals (PRESERVISION/LUTEIN PO) Take 1 capsule by mouth at bedtime.     Historical Provider, MD  oxyCODONE-acetaminophen (PERCOCET/ROXICET) 5-325 MG per tablet Take 1 tablet by mouth every 4 (four) hours as needed for severe pain (pain).    Historical Provider, MD  oxyCODONE-acetaminophen (PERCOCET/ROXICET) 5-325 MG per tablet Take 2 tablets by mouth every 4 (four) hours as needed. 03/29/14   Tanna Furry, MD  Phenazopyridine HCl (AZO TABS PO) Take 1 tablet by mouth as needed (for bladder pain).    Historical Provider, MD  simvastatin (ZOCOR) 20 MG tablet TAKE ONE TABLET BY MOUTH ONE TIME DAILY 04/03/14   Chipper Herb, MD  theophylline (THEODUR) 200 MG 12 hr tablet Take 200 mg by mouth at bedtime.    Historical Provider, MD   BP 114/67  Pulse 90  Temp(Src) 97.9 F (36.6 C) (Oral)  Resp 21  Ht 5\' 3"  (1.6 m)  Wt 130 lb (58.968 kg)  BMI 23.03 kg/m2  SpO2 97% Physical Exam  Nursing note and vitals reviewed. Constitutional: She appears well-developed and well-nourished.  HENT:  Head: Normocephalic and atraumatic.  Eyes: Conjunctivae are normal. Pupils are equal, round, and reactive to light.  Neck:  Neck supple. No tracheal deviation present. No thyromegaly present.  Cardiovascular: Normal rate and regular rhythm.   No murmur heard. Pulmonary/Chest: Effort normal and breath sounds normal.  Abdominal: Soft. Bowel sounds are normal. She exhibits no distension. There is no tenderness.  Musculoskeletal: Normal range of motion. She exhibits no edema and no tenderness.  Neurological: She is alert. No cranial nerve deficit. Coordination normal.  Gait is broad-based and unsteady she walks without assistance  Skin: Skin is warm and dry. No rash noted.  Psychiatric: She has a normal mood and affect.    ED Course  Procedures  DIAGNOSTIC STUDIES: Oxygen Saturation is 97% on RA, normal by my interpretation.    COORDINATION OF CARE: 12:45 PM Discussed treatment plan with pt at bedside and pt agreed to plan.   Labs Review Labs Reviewed  CBC WITH DIFFERENTIAL  BASIC METABOLIC PANEL  TROPONIN I    Imaging Review No results found.   EKG Interpretation   Date/Time:  Friday May 05 2014 12:39:32 EDT Ventricular Rate:  88 PR Interval:  168 QRS Duration: 81 QT Interval:  403 QTC Calculation: 488 R Axis:   60 Text Interpretation:  Sinus rhythm Borderline prolonged QT interval No  significant change since last tracing Confirmed by Winfred Leeds  MD, Onaje Warne  [phone]) on 05/05/2014 1:10:38 PM       Results for orders placed during the hospital encounter of 05/05/14  CBC WITH DIFFERENTIAL      Result Value Ref Range   WBC 14.5 (*) 4.0 - 10.5 K/uL   RBC 4.82  3.87 - 5.11 MIL/uL   Hemoglobin 14.4  12.0 - 15.0 g/dL   HCT 42.6  36.0 - 46.0 %   MCV 88.4  78.0 - 100.0 fL   MCH 29.9  26.0 - 34.0 pg   MCHC 33.8  30.0 - 36.0 g/dL   RDW 13.3  11.5 - 15.5 %   Platelets 194  150 - 400 K/uL   Neutrophils Relative % 89 (*) 43 - 77 %   Neutro Abs 12.9 (*) 1.7 - 7.7 K/uL   Lymphocytes Relative 6 (*) 12 - 46 %   Lymphs Abs 0.8  0.7 - 4.0 K/uL   Monocytes Relative 5  3 - 12 %   Monocytes Absolute  0.7  0.1 - 1.0 K/uL   Eosinophils Relative 0  0 - 5 %   Eosinophils Absolute 0.1  0.0 - 0.7 K/uL   Basophils Relative 0  0 - 1 %   Basophils Absolute 0.0  0.0 - 0.1 K/uL  BASIC METABOLIC PANEL      Result Value Ref Range   Sodium 142  137 - 147 mEq/L   Potassium 3.3 (*) 3.7 - 5.3 mEq/L   Chloride 101  96 - 112 mEq/L   CO2 27  19 - 32 mEq/L   Glucose, Bld 148 (*) 70 - 99 mg/dL   BUN 20  6 - 23 mg/dL   Creatinine, Ser 0.71  0.50 - 1.10 mg/dL   Calcium 9.2  8.4 - 10.5 mg/dL   GFR calc non Af Amer 82 (*) >90 mL/min   GFR calc Af Amer >90  >90 mL/min   Anion gap 14  5 - 15  TROPONIN I      Result Value Ref Range   Troponin I <0.30  <0.30 ng/mL   Mr Brain Wo Contrast  05/05/2014   CLINICAL DATA:  Initial encounter for multiple falls and unsteady gait over the last week. Personal history brain tumor resection 1986. Personal history of breast cancer.  EXAM: MRI HEAD WITHOUT CONTRAST  TECHNIQUE: Multiplanar, multiecho pulse sequences of the brain and surrounding structures were obtained without intravenous contrast.  COMPARISON:  None.  FINDINGS: Patient is status post  left frontal craniotomy. There is no significant parenchymal encephalomalacia. The tumor section may have been for a meningioma.  No acute infarct, hemorrhage, or mass lesion is evident. Mild periventricular and subcortical white matter changes are essentially within normal limits for age. The ventricles are proportionate to the degree of mild atrophy. No significant extra-axial fluid collection is present.  Flow is present in the major intracranial arteries. The patient is status post bilateral lens replacements. A polyp or mucous retention cyst is noted posteriorly in the left ethmoid air cells. The remaining paranasal sinuses and the mastoid air cells are clear.  IMPRESSION: 1. Status post left frontal craniotomy without significant underlying encephalomalacia. 2. Mild atrophy and white matter disease is within normal limits for age.  3. No acute or focal lesion to explain the patient's symptoms. 4. Opacification of a single posterior left ethmoid air cell.   Electronically Signed   By: Lawrence Santiago M.D.   On: 05/05/2014 15:04   Dg Chest Portable 1 View  05/05/2014   CLINICAL DATA:  Supraventricular tachycardia today. COPD and asthma. History of right-sided breast cancer and remote brain tumor resection.  EXAM: PORTABLE CHEST - 1 VIEW  COMPARISON:  01/20/2014  FINDINGS: The patient is mildly rotated to the right. Cardiac silhouette does not appear significantly changed and is within normal limits for size. Thoracic aorta calcification is again seen. Moderately large hiatal hernia remains. The patient has taken a slightly shallower inspiration than on the prior study. There is mild opacity in the left lung base which is new. Right lung remains grossly clear. No pleural effusion or pneumothorax is identified. No acute osseous abnormality is identified.  IMPRESSION: 1. Mild left basilar opacity, most likely atelectasis. 2. Hiatal hernia.   Electronically Signed   By: Logan Bores   On: 05/05/2014 13:10    Chest xray viewed by me   [facility] showed SVT at 160 beats per minute 4:30 PM theophylline level is pending. Case signed out to [place]  I spoke with Dr.Mcdowell plan prescription Cardizem 30 mg when necessary the palpitations An appointment has been scheduled for the patient at his office for 05/10/2014 140 p.m. A walker has been prescribed for the patient after case management consult obtained Based on MRI scan there is no evidence of stroke.If pt is theophylline toxic, will likely require inpatient stay.  Pt is fall risk Diagnosis #1 supraventricular tachycardia #2 hypokalemia Final diagnoses:  None         Orlie Dakin, MD 05/05/14 1644

## 2014-05-05 NOTE — ED Notes (Signed)
Pt ambulated with standby assistance to the bathroom. HR upon returning to room was 100, O2 saturation was 97% with minimal shortness of breath noted.

## 2014-05-05 NOTE — Discharge Instructions (Signed)
Supraventricular Tachycardia  Use your walker to prevent falls. Keep your scheduled appointment with Dr. Domenic Polite for 05/10/2014 at 1:40 PM. Take the medication prescribed as needed if you feel your heart racing. Take these instructions along with the attached EKG (heart tracing) with you to your office visit Supraventricular tachycardia (SVT) is when the heart beats very fast. SVT can last for a long time (sustained) or it can start and stop suddenly (nonsustained). HOME CARE   Take your heart medicine as told by your doctor. Check with your doctor before taking cold, diet, or herbal medicine.  Do not smoke.  Do not drink large amounts of caffeine.Caffeine is found in coffee, tea, soda (pop, cola), and chocolate.  Keep all doctor visits as told. GET HELP RIGHT AWAY IF:   You have chest pain or pressure.  You cannot catch your breath.  You are dizzy or lightheaded.  You feel like you will pass out (faint).  You are sweaty (diaphoretic) and feel sick to your stomach (nauseous) or throw up (vomit).  If you have the above problems, call your local emergency services (911 in [place] right away. Do not drive yourself to the hospital. MAKE SURE YOU:   Understand these instructions.  Will watch your condition.  Will get help right away if you are not doing well or get worse. Document Released: 07/21/2005 Document Revised: 10/13/2011 Document Reviewed: 10/25/2008 [employer] Patient Information 2015 [place], [place]. This information is not intended to replace advice given to you by your health care provider. Make sure you discuss any questions you have with your health care provider.

## 2014-05-05 NOTE — ED Provider Notes (Signed)
I had a lengthy conversation with the patient and her son about the pending laboratory results. Patient is in no distress, participated appropriately in conversation.  Blood pressure is normal, heart rate normal. The pending theophylline level may not be available for some time.  Patient has preference for discharge with outpatient followup. Given the absence of ongoing symptoms, this seems reasonable, and additional minute aware of abnormal results if they occur. I discussed return precautions explicitly with her and her son. Patient requests analgesia for left rib pain from a prior fall.   Carmin Muskrat, MD 05/05/14 2106

## 2014-05-05 NOTE — Progress Notes (Signed)
   Subjective:    Patient ID: Krystal [name], female    DOB: Dec 25, 1940, 73 y.o.   MRN: [id number]  HPI  C/O falling and unable to stand.  She has been c/o rapid heart rate for a week.  She states she is having some urinary frequency and hesitancy.  She states when ever she stands she gets light headed and has to sit.  She is in [facility] and is accompanied by family.   Review of Systems C/o tachycardia No chest pain, SOB, HA, dizziness, vision change, N/V, diarrhea, constipation, dysuria, urinary urgency or frequency, myalgias, arthralgias or rash.     Objective:   Physical Exam  Vital signs noted  Well developed well nourished female.  HEENT - Head atraumatic Normocephalic                Opx - wnl Respiratory - Lungs CTA bilateral Cardiac -  Tachy GI - Abdomen soft Nontender and bowel sounds active    EKG - SVT Assessment & Plan:  Tachycardia - Plan: EKG 12-Lead 911 called and EMS arrives and place patient on stretcher and then taken to ED.  Lysbeth Penner FNP

## 2014-05-05 NOTE — Progress Notes (Signed)
Received call from staff to consult regarding home health.  Communicated with patient and son at bedside regarding home health and informed did not need home health services.

## 2014-05-08 ENCOUNTER — Encounter: Payer: Medicare Other | Admitting: Cardiology

## 2014-05-09 LAB — THEOPHYLLINE LEVEL: Theophylline Lvl: <0.5 ug/mL — ABNORMAL LOW (ref 10.0–20.0)

## 2014-05-10 ENCOUNTER — Encounter: Payer: Self-pay | Admitting: Cardiology

## 2014-05-10 ENCOUNTER — Ambulatory Visit (INDEPENDENT_AMBULATORY_CARE_PROVIDER_SITE_OTHER): Payer: Medicare Other | Admitting: Cardiology

## 2014-05-10 VITALS — BP 108/68 | HR 112 | Ht 64.0 in

## 2014-05-10 DIAGNOSIS — I471 Supraventricular tachycardia: Secondary | ICD-10-CM

## 2014-05-10 DIAGNOSIS — E782 Mixed hyperlipidemia: Secondary | ICD-10-CM

## 2014-05-10 MED ORDER — DILTIAZEM HCL 120 MG PO TABS: ORAL_TABLET | ORAL | Status: DC

## 2014-05-10 MED ORDER — NITROGLYCERIN 0.4 MG SL SUBL: 0.4000 mg | SUBLINGUAL_TABLET | SUBLINGUAL | Status: DC | PRN

## 2014-05-10 NOTE — Assessment & Plan Note (Signed)
Recently documented, possible reentrant mechanism. Patient recalls having trouble with heart rhythm changes in the past, although details are not clear. Not entirely clear to me that this explains all of her recent symptoms or falls. She was given a prescription for short acting Cardizem at ER visit, although has not filled it. She states that she stopped taking Hubbard Robinson, and I agree with this, would not reinstitute. We will try Cardizem CD 120 mg each evening. Hopefully blood pressure will tolerate this, if not may need to consider digoxin or even concurrent use of a volume expander such as Florinef. I asked her to make a followup visit with Dr. Laurance Flatten to address her other symptoms. We will obtain an echocardiogram and plan to see her back in the next 4-6 weeks.

## 2014-05-10 NOTE — Patient Instructions (Addendum)
Your physician recommends that you schedule a follow-up appointment in: 4-6 weeks with Dr. Domenic Polite  Your physician has recommended you make the following change in your medication:   START CARDIZEM 120 MG DAILY AT BEDTIME  Your physician has requested that you have an echocardiogram. Echocardiography is a painless test that uses sound waves to create images of your heart. It provides your doctor with information about the size and shape of your heart and how well your heart's chambers and valves are working. This procedure takes approximately one hour. There are no restrictions for this procedure.  Please follow up with Dr. Laurance Flatten  Thank you for choosing [facility]!!

## 2014-05-10 NOTE — Assessment & Plan Note (Signed)
On Zocor and omega-3 supplements.

## 2014-05-10 NOTE — Progress Notes (Signed)
Clinical Summary Ms. Freshour is a 73 y.o.female referred for cardiology consultation by Dr. Winfred Leeds after recent ER visit on October 2. She had been seen that day at her primary care office complaining of falls, also rapid heart rate with lightheadedness. She was noted to be in SVT at 169 beats per minute, EMS was called, patient given adenosine reportedly with conversion to atrial fibrillation and subsequently sinus rhythm. Fluid bolus also given with hypotension. Subsequent tracings in the ER showed sinus rhythm. She had been on long-term theophylline, level was less than 0.5 however. Troponin I level less than 0.3.  Additional lab work done recently showed potassium 3.3, BUN 20, creatinine 0.7, hemoglobin 14.4, platelets 194. Brain MRI done during ER visit showed evidence of prior left frontal craniotomy, mild atrophy and white matter disease, no acute infarct however. Chest x-ray revealed mild left basilar atelectasis and hiatal hernia.  She tells that she has been having problems with recurring falls, no syncope or frank dizziness however, since August of this year. She has not been overly aware of having a rapid heart rate. Recently, she has also been experiencing achiness and soreness in her chest and arms, states she has not been able to wear Brahler do to chest soreness. She does not recall any specific injury.  Today heart rate initially elevated at 110, came down to the 90s however, in sinus rhythm. I discussed the recent findings of SVT. She tells that she had a problem years ago when her heart was "out of rhythm." She does not recall the details, or any specific medical treatment.  No history of hypertension, she is not on any antihypertensives.    Allergies  Allergen Reactions  . Actonel [Risedronate] Nausea And Vomiting and Other (See Comments)    Throat tightness  . Fosamax [Alendronate Sodium] Other (See Comments)    esophagitis  . Mevacor [Lovastatin] Other (See Comments)     myalgias  . Miacalcin [Calcitonin ([place])] Nausea And Vomiting    Current Outpatient Prescriptions  Medication Sig Dispense Refill  . albuterol (PROAIR HFA) 108 (90 BASE) MCG/ACT inhaler Inhale 1 puff into the lungs 4 (four) times daily as needed for wheezing or shortness of breath.      Marland Kitchen aspirin EC 81 MG tablet Take 81 mg by mouth at bedtime.      . cetirizine (ZYRTEC) 10 MG tablet Take 10 mg by mouth at bedtime.       . Cholecalciferol (VITAMIN D) 2000 UNITS CAPS Take 1 capsule by mouth at bedtime.      . diazepam (VALIUM) 5 MG tablet Take 5 mg by mouth every 6 (six) hours as needed for anxiety.      Marland Kitchen esomeprazole (NEXIUM) 40 MG capsule Take 40 mg by mouth at bedtime.      . ferrous sulfate 325 (65 FE) MG tablet Take 325 mg by mouth at bedtime.      . fish oil-omega-3 fatty acids 1000 MG capsule Take 1 g by mouth at bedtime.       . furosemide (LASIX) 20 MG tablet Take 20 mg by mouth daily as needed for fluid.      Marland Kitchen ipratropium-albuterol (DUONEB) 0.5-2.5 (3) MG/3ML SOLN Take 3 mLs by nebulization every 6 (six) hours as needed. Dx:  Asthma 493.90  360 mL  2  . meloxicam (MOBIC) 7.5 MG tablet Take 1 tablet (7.5 mg total) by mouth daily.  30 tablet  1  . montelukast (SINGULAIR) 10 MG tablet Take  10 mg by mouth at bedtime.      . Multiple Vitamins-Minerals (PRESERVISION/LUTEIN PO) Take 1 capsule by mouth at bedtime.       Marland Kitchen oxyCODONE-acetaminophen (PERCOCET/ROXICET) 5-325 MG per tablet Take 1 tablet by mouth every 4 (four) hours as needed for severe pain (pain).      . Phenazopyridine HCl (AZO TABS PO) Take 1 tablet by mouth as needed (for bladder pain).      . simvastatin (ZOCOR) 20 MG tablet TAKE ONE TABLET BY MOUTH ONE TIME DAILY  90 tablet  0  . diltiazem (CARDIZEM) 120 MG tablet Take 1 tablet at bedtime  30 tablet  3   No current facility-administered medications for this visit.    Past Medical History  Diagnosis Date  . COPD (chronic obstructive pulmonary disease)   .  Hyperlipidemia   . Leg edema   . Asthma   . Right knee pain   . Breast cancer     Right breast  . Brain tumor     1986  . Colon polyps   . Hiatal hernia     Past Surgical History  Procedure Laterality Date  . Mastectomy Right   . Partial hysterectomy    . Craniectomy / craniotomy for excision of brain tumor      Left side brain  . Skin cancer excision      Under left eye  . Colonoscopy    . Polypectomy      Family History  Problem Relation Age of Onset  . Heart disease Mother 34    Stroke   . Heart disease Sister   . Hypertension Sister   . Diabetes Sister   . Heart disease Brother   . Hyperlipidemia Brother   . Diabetes Brother   . Heart disease Sister   . Hypertension Sister   . Diabetes Sister   . Diabetes Sister   . Hypertension Sister   . Diabetes Sister   . Hypertension Sister   . Diabetes Brother   . Hyperlipidemia Brother   . Colon cancer Neg Hx   . Rectal cancer Neg Hx   . Stomach cancer Neg Hx   . Esophageal cancer Neg Hx   . Cancer Father     lung  . Heart attack Father   . Diabetes Brother   . Heart disease Brother   . Hyperlipidemia Brother   . Diabetes Brother     Social History Ms. Marolf reports that she quit smoking about 53 years ago. Her smoking use included Cigarettes. She smoked 0.00 packs per day. She has never used smokeless tobacco. Ms. Nannini reports that she does not drink alcohol.  Review of Systems Feels weak, fair appetite. No nausea or emesis. Other systems reviewed and negative except as outlined.  Physical Examination Filed Vitals:   05/10/14 1336  BP: 108/68  Pulse: 112   Elderly woman, in wheelchair, no distress. HEENT: Conjunctiva and lids normal, oropharynx clear. Neck: Supple, no elevated JVP or carotid bruits, no thyromegaly. Lungs: Decreased breath sounds but clear, nonlabored breathing at rest. Cardiac: Regular rate and rhythm, no S3, soft systolic murmur, no pericardial rub. Abdomen: Soft, nontender,  bowel sounds present, no guarding or rebound. Extremities: Trace ankle edema, distal pulses 2+. Skin: Warm and dry. Musculoskeletal: No kyphosis. Neuropsychiatric: Alert and oriented x3, affect grossly appropriate.   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 Patient recalls having trouble with heart rhythm changes in the past,  although details are not clear. Not entirely clear to me that this explains all of her recent symptoms or falls. She was given a prescription for short acting Cardizem at ER visit, although has not filled it. She states that she stopped taking Hubbard Robinson, and I agree with this, would not reinstitute. We will try Cardizem CD 120 mg each evening. Hopefully blood pressure will tolerate this, if not may need to consider digoxin or even concurrent use of a volume expander such as Florinef. I asked her to make a followup visit with Dr. Laurance Flatten to address her other symptoms. We will obtain an echocardiogram and plan to see her back in the next 4-6 weeks.  Mixed hyperlipidemia On Zocor and omega-3 supplements.    Satira Sark, M.D., F.A.C.C.

## 2014-05-11 ENCOUNTER — Telehealth: Payer: Self-pay | Admitting: Cardiology

## 2014-05-11 ENCOUNTER — Other Ambulatory Visit: Payer: Self-pay | Admitting: *Deleted

## 2014-05-11 MED ORDER — SIMVASTATIN 10 MG PO TABS: ORAL_TABLET | ORAL | Status: DC

## 2014-05-11 NOTE — Telephone Encounter (Signed)
Drug interaction paper / please see refill bin / tgs

## 2014-05-11 NOTE — Telephone Encounter (Signed)
I called Dr.Mores's office [phone] to let them know pt's Zocor  Was decreased to 10 mg daily,they states they would let MD knows.LM for pt that her Zocor was decreased to 10 mg daily.[facility] pharmacist states pt just filled 20 mg dose and she can cut in half    We received drug interaction notification regarding combination of cardizem and zocor potentiation to liver

## 2014-05-11 NOTE — Telephone Encounter (Signed)
Decreased simvastatin from 20 mg to 10 mg per Dr. Domenic Polite. Dr. Laurance Flatten manages pt cholesterol. Will fax to [place] and contacted Dr. Laurance Flatten regarding simvistain

## 2014-05-13 NOTE — Progress Notes (Signed)
Received fax from [place] regarding clinical notes for requests for DME equipment.  Followed up with [facility] spoke with representative, per representative the information was received and processed for patient.  The patient spoke with Melissa Ziglar at [facility].  The equipment will be shipped to the address provided.  Contacted patient and Ms. Brienza confirmed she received the walker from [employer] on May 12, 2014.

## 2014-05-15 ENCOUNTER — Ambulatory Visit (HOSPITAL_COMMUNITY)
Admission: RE | Admit: 2014-05-15 | Discharge: 2014-05-15 | Disposition: A | Discharge status: Home / Self Care | Payer: Medicare Other | Source: Ambulatory Visit | Attending: Cardiology | Admitting: Cardiology

## 2014-05-15 DIAGNOSIS — E785 Hyperlipidemia, unspecified: Secondary | ICD-10-CM | POA: Insufficient documentation

## 2014-05-15 DIAGNOSIS — I359 Nonrheumatic aortic valve disorder, unspecified: Secondary | ICD-10-CM | POA: Diagnosis not present

## 2014-05-15 DIAGNOSIS — Z853 Personal history of malignant neoplasm of breast: Secondary | ICD-10-CM | POA: Diagnosis not present

## 2014-05-15 DIAGNOSIS — I471 Supraventricular tachycardia: Secondary | ICD-10-CM

## 2014-05-15 DIAGNOSIS — Z87891 Personal history of nicotine dependence: Secondary | ICD-10-CM | POA: Insufficient documentation

## 2014-05-15 DIAGNOSIS — J449 Chronic obstructive pulmonary disease, unspecified: Secondary | ICD-10-CM | POA: Insufficient documentation

## 2014-05-15 DIAGNOSIS — I082 Rheumatic disorders of both aortic and tricuspid valves: Secondary | ICD-10-CM | POA: Insufficient documentation

## 2014-05-15 NOTE — Progress Notes (Signed)
  Echocardiogram 2D Echocardiogram has been performed.  Krystal [name], Krystal [name] 05/15/2014, 1:39 PM

## 2014-05-30 ENCOUNTER — Ambulatory Visit: Payer: Medicare Other | Admitting: Family Medicine

## 2014-06-03 ENCOUNTER — Other Ambulatory Visit: Payer: Self-pay | Admitting: Family Medicine

## 2014-06-05 ENCOUNTER — Ambulatory Visit (INDEPENDENT_AMBULATORY_CARE_PROVIDER_SITE_OTHER): Payer: Medicare Other | Admitting: Family Medicine

## 2014-06-05 ENCOUNTER — Ambulatory Visit (INDEPENDENT_AMBULATORY_CARE_PROVIDER_SITE_OTHER): Payer: Medicare Other

## 2014-06-05 ENCOUNTER — Encounter: Payer: Self-pay | Admitting: Family Medicine

## 2014-06-05 VITALS — BP 110/60 | HR 104 | Temp 97.4°F | Ht 64.5 in | Wt 131.0 lb

## 2014-06-05 DIAGNOSIS — M546 Pain in thoracic spine: Secondary | ICD-10-CM

## 2014-06-05 DIAGNOSIS — R0781 Pleurodynia: Secondary | ICD-10-CM | POA: Diagnosis not present

## 2014-06-05 DIAGNOSIS — J439 Emphysema, unspecified: Secondary | ICD-10-CM

## 2014-06-05 DIAGNOSIS — I700 Atherosclerosis of aorta: Secondary | ICD-10-CM | POA: Diagnosis not present

## 2014-06-05 DIAGNOSIS — R000 Tachycardia, unspecified: Secondary | ICD-10-CM

## 2014-06-05 NOTE — Patient Instructions (Signed)
Please follow-up with cardiology as planned Please do as little lifting as possible because of your kyphotic posture and the radiating pain you have to both sides of your chest Drink plenty of fluids Take medicine is currently doing

## 2014-06-05 NOTE — Progress Notes (Signed)
Subjective:    Patient ID: Krystal [name], female    DOB: Nov 26, 1940, 73 y.o.   MRN: [id number]  HPI Patient here today for follow up from Cardiology visit.patient continues to have rib and back pain because of the lifting and pulling that she does at home with her bedridden mother.The patient has paroxysmal supraventricular tachycardia and was recently seen by the cardiologist and had an echocardiogram.the patient has a follow-up visit scheduled with the cardiologist this month. She would like to change from nitroglycerin tablets to nitroglycerin spray and we will let him take care of this when he sees her.        Patient Active Problem List   Diagnosis Date Noted  . PSVT (paroxysmal supraventricular tachycardia) 05/10/2014  . Macular degeneration, age related, nonexudative 08/24/2013  . History of right mastectomy 07/13/2013  . Anemia, iron deficiency 06/21/2013  . Allergic rhinitis 06/21/2013  . Chronic low back pain 06/21/2013  . NEOPLASM, MALIGNANT, BREAST 11/22/2007  . Mixed hyperlipidemia 11/22/2007  . ANXIETY 11/22/2007  . DEPRESSION 11/22/2007  . ASTHMA, UNSPECIFIED, UNSPECIFIED STATUS 11/22/2007  . GERD 11/22/2007  . GASTRITIS, WITH HEMORRHAGE 08/14/2007  . DUODENITIS, WITHOUT HEMORRHAGE 08/14/2007  . HIATAL HERNIA 08/14/2007  . 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   Outpatient Encounter Prescriptions as of 06/05/2014  Medication Sig  . albuterol (PROAIR HFA) 108 (90 BASE) MCG/ACT inhaler Inhale 1 puff into the lungs 4 (four) times daily as needed for wheezing or shortness of breath.  Marland Kitchen aspirin EC 81 MG tablet Take 81 mg by mouth at bedtime.  . cetirizine (ZYRTEC) 10 MG tablet Take 10 mg by mouth at bedtime.   . Cholecalciferol (VITAMIN D) 2000 UNITS CAPS Take 1 capsule by mouth at bedtime.  . diazepam (VALIUM) 5 MG tablet Take 5 mg by mouth every 6 (six) hours as needed for anxiety.  Marland Kitchen diltiazem (CARDIZEM) 120 MG tablet Take 1 tablet at bedtime  . esomeprazole  (NEXIUM) 40 MG capsule Take 40 mg by mouth at bedtime.  . ferrous sulfate 325 (65 FE) MG tablet Take 325 mg by mouth at bedtime.  . fish oil-omega-3 fatty acids 1000 MG capsule Take 1 g by mouth at bedtime.   . furosemide (LASIX) 20 MG tablet Take 20 mg by mouth daily as needed for fluid.  Marland Kitchen ipratropium-albuterol (DUONEB) 0.5-2.5 (3) MG/3ML SOLN Take 3 mLs by nebulization every 6 (six) hours as needed. Dx:  Asthma 493.90  . meloxicam (MOBIC) 7.5 MG tablet Take 1 tablet (7.5 mg total) by mouth daily.  . montelukast (SINGULAIR) 10 MG tablet Take 10 mg by mouth at bedtime.  . Multiple Vitamins-Minerals (PRESERVISION/LUTEIN PO) Take 1 capsule by mouth at bedtime.   Marland Kitchen oxyCODONE-acetaminophen (PERCOCET/ROXICET) 5-325 MG per tablet Take 1 tablet by mouth every 4 (four) hours as needed for severe pain (pain).  . Phenazopyridine HCl (AZO TABS PO) Take 1 tablet by mouth as needed (for bladder pain).  . simvastatin (ZOCOR) 10 MG tablet Take 5 mg by mouth daily. TAKE ONE TABLET BY MOUTH ONE TIME DAILY    Review of Systems  Constitutional: Negative.   HENT: Negative.   Eyes: Negative.   Respiratory: Negative.   Cardiovascular: Negative.   Gastrointestinal: Negative.   Endocrine: Negative.   Genitourinary: Negative.   Musculoskeletal: Positive for arthralgias (rib and back pain - multiple recent falls).  Skin: Negative.   Allergic/Immunologic: Negative.   Neurological: Negative.   Hematological: Negative.   Psychiatric/Behavioral: Negative.  Objective:   Physical Exam  Constitutional: She is oriented to person, place, and time. No distress.  Thin kyphotic and elderly appearing white female who is alert and in no acute distress  HENT:  Head: Normocephalic and atraumatic.  Right Ear: External ear normal.  Left Ear: External ear normal.  Nose: Nose normal.  Mouth/Throat: Oropharynx is clear and moist. No oropharyngeal exudate.  Eyes: Conjunctivae and EOM are normal. Pupils are equal,  round, and reactive to light. Right eye exhibits no discharge. Left eye exhibits no discharge. No scleral icterus.  Neck: Normal range of motion. Neck supple. No thyromegaly present.  No carotid bruits or thyromegaly  Cardiovascular: Normal rate and normal heart sounds.  Exam reveals no gallop and no friction rub.   No murmur heard. The heart is slightly irregular at 96/m  Pulmonary/Chest: Effort normal and breath sounds normal. No respiratory distress. She has no wheezes. She has no rales. She exhibits tenderness.  There is chest soreness and tenderness bilaterally on the sides and anteriorly  Abdominal: Soft. Bowel sounds are normal. She exhibits no mass. There is no tenderness. There is no rebound and no guarding.  Musculoskeletal: Normal range of motion. She exhibits no edema or tenderness.  Lymphadenopathy:    She has no cervical adenopathy.  Neurological: She is alert and oriented to person, place, and time. She has normal reflexes. No cranial nerve deficit.  Skin: Skin is warm and dry. No rash noted.  Psychiatric: She has a normal mood and affect. Her behavior is normal. Judgment and thought content normal.  Nursing note and vitals reviewed.  BP 110/60 mmHg  Pulse 104  Temp(Src) 97.4 F (36.3 C) (Oral)  Ht 5' 4.5" (1.638 m)  Wt 131 lb (59.421 kg)  BMI 22.15 kg/m2  [facility] reading (PRIMARY) by  Dr.Moore-thoracic spine-- severe degenerative changes and kyphotic changes. Thoracic atherosclerosis.                                       Assessment & Plan:  1. Rib pain - DG Thoracic Spine 2 View; Future  2. Thoracic back pain, unspecified back pain laterality - DG Thoracic Spine 2 View; Future  3. Tachycardia  4. Pulmonary emphysema, unspecified emphysema type  5.thoracic aortic atherosclerosis  6. 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   Patient Instructions  Please follow-up with cardiology as planned Please  do as little lifting as possible because of your kyphotic posture and the radiating pain you have to both sides of your chest Drink plenty of fluids Take medicine is currently doing   Arrie Senate MD

## 2014-06-06 NOTE — Telephone Encounter (Signed)
rx called into pharmacy for diazepam

## 2014-06-06 NOTE — Telephone Encounter (Signed)
Last seen 06/05/14  If approved route to nurse to call into [facility]

## 2014-06-06 NOTE — Telephone Encounter (Signed)
For these prescriptions are okay to refill

## 2014-06-19 ENCOUNTER — Other Ambulatory Visit: Payer: Self-pay

## 2014-06-19 ENCOUNTER — Telehealth: Payer: Self-pay | Admitting: *Deleted

## 2014-06-19 NOTE — Telephone Encounter (Signed)
Pt has been on Diltiazem since Oct 7th,doubt this is the cause.have left message for her to call back to triage.Unclear when she started meloxicam

## 2014-06-19 NOTE — Telephone Encounter (Signed)
Pt went on meloxicam around the same time as Diltiazem.She reports itchy rash over entire body.She is going to check with Dr.Moore who prescribed meloxicam.I will forward this to Dr Domenic Polite

## 2014-06-19 NOTE — Telephone Encounter (Signed)
Pt is breaking out and thinks it is due to the new medications she was put on. meloxicam by Dr Laurance Flatten office / or diltiazem.

## 2014-06-20 ENCOUNTER — Ambulatory Visit: Payer: Medicare Other | Admitting: Cardiology

## 2014-07-02 ENCOUNTER — Other Ambulatory Visit: Payer: Self-pay | Admitting: Family Medicine

## 2014-07-06 ENCOUNTER — Other Ambulatory Visit: Payer: Self-pay | Admitting: Family Medicine

## 2014-07-06 NOTE — Telephone Encounter (Signed)
This is okay to refill 

## 2014-07-06 NOTE — Telephone Encounter (Signed)
Patient last seen in office on 06-05-14. Rx last filled on 06-06-14 for #30. Please advise. If approved please print and route to [facility] so nurse can call patient to pick up

## 2014-07-17 ENCOUNTER — Ambulatory Visit (INDEPENDENT_AMBULATORY_CARE_PROVIDER_SITE_OTHER): Payer: Medicare Other | Admitting: Cardiology

## 2014-07-17 ENCOUNTER — Encounter: Payer: Self-pay | Admitting: Cardiology

## 2014-07-17 DIAGNOSIS — R000 Tachycardia, unspecified: Secondary | ICD-10-CM | POA: Diagnosis not present

## 2014-07-17 MED ORDER — DILTIAZEM HCL ER COATED BEADS 180 MG PO CP24: 180.0000 mg | ORAL_CAPSULE | Freq: Every day | ORAL | Status: DC

## 2014-07-17 NOTE — Patient Instructions (Signed)
Your physician recommends that you schedule a follow-up appointment in: 1 month with Dr. Domenic Polite  Your physician has recommended you make the following change in your medication:   INCREASE CARDIZEM [place] physician has recommended that you wear an event monitor FOR 7 DAYS. Event monitors are medical devices that record the heart's electrical activity. Doctors most often [place] these monitors to diagnose arrhythmias. Arrhythmias are problems with the speed or rhythm of the heartbeat. The monitor is a small, portable device. You can wear one while you do your normal daily activities. This is usually used to diagnose what is causing palpitations/syncope (passing out).  Thank you for choosing [facility]!!

## 2014-07-17 NOTE — Assessment & Plan Note (Signed)
We will increase Cardizem CD to 180 mg daily, and a seven-day cardiac monitor will be obtained to try to better elucidate whether there is any definitive rhythm change associated with her symptoms. Follow-up is arranged.

## 2014-07-17 NOTE — Progress Notes (Signed)
Reason for visit: PSVT  Clinical Summary [name] is a 73 y.o.female last seen in October. She was placed on Cardizem CD at that time for management of intermittent palpitations. She states that she continues to feel episodes of rapid heart rate, quoting heart rates up into the "170s." Feels dizzy with these episodes. ECG today showed sinus tachycardia with poor R-wave progression. She feels a bandlike sensation around her chest, although not necessarily with these palpitations. States it is hard for her to wear a bra due to this.  Echocardiogram from October showed normal LV wall thickness and chamber size with LVEF 60-65%, mild aortic regurgitation, normal PASP 33 mm mercury. We discussed the results.  Allergies  Allergen Reactions  . Actonel [Risedronate] Nausea And Vomiting and Other (See Comments)    Throat tightness  . Fosamax [Alendronate Sodium] Other (See Comments)    esophagitis  . Meloxicam Hives and Itching  . Mevacor [Lovastatin] Other (See Comments)    myalgias  . Miacalcin [Calcitonin ([place])] Nausea And Vomiting    Current Outpatient Prescriptions  Medication Sig Dispense Refill  . albuterol (PROAIR HFA) 108 (90 BASE) MCG/ACT inhaler Inhale 1 puff into the lungs 4 (four) times daily as needed for wheezing or shortness of breath.    Marland Kitchen aspirin EC 81 MG tablet Take 81 mg by mouth at bedtime.    . cetirizine (ZYRTEC) 10 MG tablet Take 10 mg by mouth at bedtime.     . Cholecalciferol (VITAMIN D) 2000 UNITS CAPS Take 1 capsule by mouth at bedtime.    . diazepam (VALIUM) 5 MG tablet TAKE ONE TABLET BY MOUTH EVERY SIX HOURS AS NEEDED 30 tablet 0  . esomeprazole (NEXIUM) 40 MG capsule Take 40 mg by mouth at bedtime.    . ferrous sulfate 325 (65 FE) MG tablet Take 325 mg by mouth at bedtime.    . fish oil-omega-3 fatty acids 1000 MG capsule Take 1 g by mouth at bedtime.     . furosemide (LASIX) 20 MG tablet TAKE ONE TABLET BY MOUTH ONE  TIME DAILY 30 tablet 5  .  ipratropium-albuterol (DUONEB) 0.5-2.5 (3) MG/3ML SOLN Take 3 mLs by nebulization every 6 (six) hours as needed. Dx:  Asthma 493.90 360 mL 2  . meloxicam (MOBIC) 7.5 MG tablet Take 1 tablet (7.5 mg total) by mouth daily. 30 tablet 1  . montelukast (SINGULAIR) 10 MG tablet TAKE ONE TABLET BY MOUTH AT BEDTIME 30 tablet 5  . Multiple Vitamins-Minerals (PRESERVISION/LUTEIN PO) Take 1 capsule by mouth at bedtime.     Marland Kitchen oxyCODONE-acetaminophen (PERCOCET/ROXICET) 5-325 MG per tablet Take 1 tablet by mouth every 4 (four) hours as needed for severe pain (pain).    . Phenazopyridine HCl (AZO TABS PO) Take 1 tablet by mouth as needed (for bladder pain).    . simvastatin (ZOCOR) 10 MG tablet Take 5 mg by mouth daily. TAKE ONE TABLET BY MOUTH ONE TIME DAILY    . traMADol (ULTRAM) 50 MG tablet TAKE 1 TABLET BY MOUTH TWICE DAILY FOR MODERATE PAIN 30 tablet 0  . diltiazem (CARDIZEM CD) 180 MG 24 hr capsule Take 1 capsule (180 mg total) by mouth daily. 90 capsule 3   No current facility-administered medications for this visit.    Past Medical History  Diagnosis Date  . COPD (chronic obstructive pulmonary disease)   . Hyperlipidemia   . Leg edema   . Asthma   . Right knee pain   . Breast cancer  Right breast  . Brain tumor     1986  . Colon polyps   . Hiatal hernia     Social History Krystal [name] reports that she quit smoking about 53 years ago. Her smoking use included Cigarettes. She smoked 0.00 packs per day. She has never used smokeless tobacco. Krystal [name] reports that she does not drink alcohol.  Review of Systems Complete review of systems negative except as otherwise outlined in the clinical summary.  Physical Examination Filed Vitals:   07/17/14 1529  BP: 144/64  Pulse: 118   Filed Weights   07/17/14 1529  Weight: 141 lb (63.957 kg)    Elderly woman, in wheelchair, no distress. HEENT: Conjunctiva and lids normal, oropharynx clear. Neck: Supple, no elevated JVP or carotid  bruits, no thyromegaly. Lungs: Decreased breath sounds but clear, nonlabored breathing at rest. Cardiac: Regular rate and rhythm, no S3, soft systolic murmur, no pericardial rub. Abdomen: Soft, nontender, bowel sounds present, no guarding or rebound. Extremities: Trace ankle edema, distal pulses 2+. Skin: Warm and dry. Musculoskeletal: No kyphosis. Neuropsychiatric: Alert and oriented x3, affect grossly appropriate.   Problem List and Plan   PSVT (paroxysmal supraventricular tachycardia) We will increase Cardizem CD to 180 mg daily, and a seven-day cardiac monitor will be obtained to try to better elucidate whether there is any definitive rhythm change associated with her symptoms. Follow-up is arranged.  Mixed hyperlipidemia Continues on Zocor, followed by Dr. Laurance Flatten.    Krystal [name], M.D., F.A.C.C.

## 2014-07-17 NOTE — Assessment & Plan Note (Signed)
Continues on Zocor, followed by Dr. Laurance Flatten.

## 2014-07-18 DIAGNOSIS — R002 Palpitations: Secondary | ICD-10-CM | POA: Diagnosis not present

## 2014-07-24 ENCOUNTER — Ambulatory Visit (INDEPENDENT_AMBULATORY_CARE_PROVIDER_SITE_OTHER): Payer: Medicare Other | Admitting: Family Medicine

## 2014-07-24 ENCOUNTER — Encounter: Payer: Self-pay | Admitting: Family Medicine

## 2014-07-24 VITALS — BP 108/62 | HR 110 | Temp 96.9°F | Ht 64.0 in | Wt 138.0 lb

## 2014-07-24 DIAGNOSIS — J449 Chronic obstructive pulmonary disease, unspecified: Secondary | ICD-10-CM

## 2014-07-24 DIAGNOSIS — E785 Hyperlipidemia, unspecified: Secondary | ICD-10-CM

## 2014-07-24 DIAGNOSIS — D509 Iron deficiency anemia, unspecified: Secondary | ICD-10-CM

## 2014-07-24 DIAGNOSIS — Z9011 Acquired absence of right breast and nipple: Secondary | ICD-10-CM | POA: Diagnosis not present

## 2014-07-24 DIAGNOSIS — G894 Chronic pain syndrome: Secondary | ICD-10-CM

## 2014-07-24 DIAGNOSIS — E559 Vitamin D deficiency, unspecified: Secondary | ICD-10-CM | POA: Diagnosis not present

## 2014-07-24 DIAGNOSIS — K219 Gastro-esophageal reflux disease without esophagitis: Secondary | ICD-10-CM

## 2014-07-24 DIAGNOSIS — M549 Dorsalgia, unspecified: Secondary | ICD-10-CM | POA: Diagnosis not present

## 2014-07-24 DIAGNOSIS — K449 Diaphragmatic hernia without obstruction or gangrene: Secondary | ICD-10-CM

## 2014-07-24 DIAGNOSIS — G8929 Other chronic pain: Secondary | ICD-10-CM

## 2014-07-24 MED ORDER — GABAPENTIN 100 MG PO CAPS: 100.0000 mg | ORAL_CAPSULE | Freq: Every day | ORAL | Status: DC

## 2014-07-24 NOTE — Patient Instructions (Addendum)
Medicare Annual Wellness Visit  [facility] and the medical providers at [facility] strive to bring you the best medical care.  In doing so we not only want to address your current medical conditions and concerns but also to detect new conditions early and prevent illness, disease and health-related problems.    Medicare offers a yearly Wellness Visit which allows our clinical staff to assess your need for preventative services including immunizations, lifestyle education, counseling to decrease risk of preventable diseases and screening for fall risk and other medical concerns.    This visit is provided free of charge (no copay) for all Medicare recipients. The clinical pharmacists at [facility] have begun to conduct these Wellness Visits which will also include a thorough review of all your medications.    As you primary medical provider recommend that you make an appointment for your Annual Wellness Visit if you have not done so already this year.  You may set up this appointment before you leave today or you may call back ([phone]) and schedule an appointment.  Please make sure when you call that you mention that you are scheduling your Annual Wellness Visit with the clinical pharmacist so that the appointment may be made for the proper length of time.     Continue current medications. Continue good therapeutic lifestyle changes which include good diet and exercise. Fall precautions discussed with patient. If an FOBT was given today- please return it to our front desk. If you are over 62 years old - you may need Prevnar 13 or the adult Pneumonia vaccine.  Flu Shots will be available at our office starting mid- September. Please call and schedule a FLU CLINIC APPOINTMENT.   Continue to avoid heavy lifting pushing or pulling as much as possible Continue follow-up with cardiology Try gabapentin 100 mg at bedtime Return the  FOBT Return to clinic for fasting lab work Do not forget to get your mammogram in the spring Maintain is good of a posture as possible

## 2014-07-24 NOTE — Progress Notes (Signed)
Subjective:    Patient ID: Krystal [name], female    DOB: Apr 25, 1941, 73 y.o.   MRN: [id number]  HPI Pt here for follow up and management of chronic medical problems. The patient continues to have back and side pain since a fall in September. Recent thoracic spine film showed stable compression fractures. The patient is due to get a mammogram. She is also due to return an FOBT and get lab work. The patient continues to lift and pull on her elderly mother at home with help. There is more help now than in the past. She had a follow back in September she went to the emergency room and was found that she had a very fast heart rate and she was started on medication to slow the heart rate down. She has not had any falls since that time. She does plan to see the cardiologist in January and she just recently completed a heart monitor which she has returned to him. Most of her pain is at nighttime.        Patient Active Problem List   Diagnosis Date Noted  . PSVT (paroxysmal supraventricular tachycardia) 05/10/2014  . Macular degeneration, age related, nonexudative 08/24/2013  . History of right mastectomy 07/13/2013  . Anemia, iron deficiency 06/21/2013  . Allergic rhinitis 06/21/2013  . Chronic low back pain 06/21/2013  . NEOPLASM, MALIGNANT, BREAST 11/22/2007  . Mixed hyperlipidemia 11/22/2007  . ANXIETY 11/22/2007  . DEPRESSION 11/22/2007  . ASTHMA, UNSPECIFIED, UNSPECIFIED STATUS 11/22/2007  . GERD 11/22/2007  . GASTRITIS, WITH HEMORRHAGE 08/14/2007  . DUODENITIS, WITHOUT HEMORRHAGE 08/14/2007  . HIATAL HERNIA 08/14/2007  . 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   Outpatient Encounter Prescriptions as of 07/24/2014  Medication Sig  . albuterol (PROAIR HFA) 108 (90 BASE) MCG/ACT inhaler Inhale 1 puff into the lungs 4 (four) times daily as needed for wheezing or shortness of breath.  Marland Kitchen aspirin EC 81 MG tablet Take 81 mg by mouth at bedtime.  . cetirizine (ZYRTEC) 10 MG tablet Take  10 mg by mouth at bedtime.   . Cholecalciferol (VITAMIN D) 2000 UNITS CAPS Take 1 capsule by mouth at bedtime.  . diazepam (VALIUM) 5 MG tablet TAKE ONE TABLET BY MOUTH EVERY SIX HOURS AS NEEDED  . diltiazem (CARDIZEM CD) 180 MG 24 hr capsule Take 1 capsule (180 mg total) by mouth daily.  Marland Kitchen esomeprazole (NEXIUM) 40 MG capsule Take 40 mg by mouth at bedtime.  . ferrous sulfate 325 (65 FE) MG tablet Take 325 mg by mouth at bedtime.  . fish oil-omega-3 fatty acids 1000 MG capsule Take 1 g by mouth at bedtime.   . furosemide (LASIX) 20 MG tablet TAKE ONE TABLET BY MOUTH ONE  TIME DAILY  . ipratropium-albuterol (DUONEB) 0.5-2.5 (3) MG/3ML SOLN Take 3 mLs by nebulization every 6 (six) hours as needed. Dx:  Asthma 493.90  . montelukast (SINGULAIR) 10 MG tablet TAKE ONE TABLET BY MOUTH AT BEDTIME  . Multiple Vitamins-Minerals (PRESERVISION/LUTEIN PO) Take 1 capsule by mouth at bedtime.   Marland Kitchen oxyCODONE-acetaminophen (PERCOCET/ROXICET) 5-325 MG per tablet Take 1 tablet by mouth every 4 (four) hours as needed for severe pain (pain).  . Phenazopyridine HCl (AZO TABS PO) Take 1 tablet by mouth as needed (for bladder pain).  . simvastatin (ZOCOR) 10 MG tablet Take 5 mg by mouth daily. TAKE ONE TABLET BY MOUTH ONE TIME DAILY  . traMADol (ULTRAM) 50 MG tablet TAKE 1 TABLET BY MOUTH TWICE DAILY FOR MODERATE PAIN  . [DISCONTINUED]  meloxicam (MOBIC) 7.5 MG tablet Take 1 tablet (7.5 mg total) by mouth daily.    Review of Systems  Constitutional: Negative.   HENT: Negative.   Eyes: Negative.   Respiratory: Negative.   Cardiovascular: Negative.   Gastrointestinal: Negative.   Endocrine: Negative.   Genitourinary: Negative.   Musculoskeletal: Positive for back pain and arthralgias (side pain and rib pain - since fall in sept. ).  Skin: Negative.   Allergic/Immunologic: Negative.   Neurological: Negative.   Hematological: Negative.   Psychiatric/Behavioral: Negative.        Objective:   Physical Exam    Constitutional: She is oriented to person, place, and time. No distress.  The patient is pleasant, cooperative and 9 complaining except for her chronic back pain. She is elderly appearing and has a stooped position. She is uncomfortable laying on the table due to the back pain.  HENT:  Head: Normocephalic and atraumatic.  Right Ear: External ear normal.  Left Ear: External ear normal.  Nose: Nose normal.  Mouth/Throat: Oropharynx is clear and moist.  Eyes: Conjunctivae and EOM are normal. Pupils are equal, round, and reactive to light. Right eye exhibits no discharge. Left eye exhibits no discharge. No scleral icterus.  Neck: Normal range of motion. Neck supple. No thyromegaly present.  No anterior cervical nodes or carotid bruits  Cardiovascular: Normal heart sounds and intact distal pulses.  Exam reveals no gallop and no friction rub.   No murmur heard. The heart rate is elevated and slightly irregular at 108/m  Pulmonary/Chest: Effort normal and breath sounds normal. No respiratory distress. She has no wheezes. She has no rales. She exhibits no tenderness.  Abdominal: Soft. Bowel sounds are normal. She exhibits no mass. There is no tenderness. There is no rebound and no guarding.  Musculoskeletal: Normal range of motion. She exhibits no edema.  Lymphadenopathy:    She has no cervical adenopathy.  Neurological: She is alert and oriented to person, place, and time.  Skin: Skin is warm and dry. No rash noted.  Psychiatric: She has a normal mood and affect. Her behavior is normal. Judgment and thought content normal.  Nursing note and vitals reviewed.  BP 108/62 mmHg  Pulse 110  Temp(Src) 96.9 F (36.1 C) (Oral)  Ht 5' 4"  (1.626 m)  Wt 138 lb (62.596 kg)  BMI 23.68 kg/m2        Assessment & Plan:  1. Chronic obstructive pulmonary disease, unspecified COPD, unspecified chronic bronchitis type - POCT CBC; Future  2. Gastroesophageal reflux disease with hiatal hernia - POCT  CBC; Future - Hepatic function panel; Future  3. Hyperlipidemia - POCT CBC; Future - BMP8+EGFR; Future - Hepatic function panel; Future - NMR, lipoprofile; Future  4. Vitamin D deficiency - POCT CBC; Future - Vit D  25 hydroxy (rtn osteoporosis monitoring); Future  5. History of right mastectomy - POCT CBC; Future  6. Anemia, iron deficiency - POCT CBC; Future  7. Chronic pain syndrome - gabapentin (NEURONTIN) 100 MG capsule; Take 1 capsule (100 mg total) by mouth at bedtime.  Dispense: 30 capsule; Refill: 2  8. Chronic back pain - gabapentin (NEURONTIN) 100 MG capsule; Take 1 capsule (100 mg total) by mouth at bedtime.  Dispense: 30 capsule; Refill: 2  Patient Instructions                       Medicare Annual Wellness Visit  [facility] and the medical providers at [facility] strive  to bring you the best medical care.  In doing so we not only want to address your current medical conditions and concerns but also to detect new conditions early and prevent illness, disease and health-related problems.    Medicare offers a yearly Wellness Visit which allows our clinical staff to assess your need for preventative services including immunizations, lifestyle education, counseling to decrease risk of preventable diseases and screening for fall risk and other medical concerns.    This visit is provided free of charge (no copay) for all Medicare recipients. The clinical pharmacists at [facility] have begun to conduct these Wellness Visits which will also include a thorough review of all your medications.    As you primary medical provider recommend that you make an appointment for your Annual Wellness Visit if you have not done so already this year.  You may set up this appointment before you leave today or you may call back ([phone]) and schedule an appointment.  Please make sure when you call that you mention that you are scheduling your  Annual Wellness Visit with the clinical pharmacist so that the appointment may be made for the proper length of time.     Continue current medications. Continue good therapeutic lifestyle changes which include good diet and exercise. Fall precautions discussed with patient. If an FOBT was given today- please return it to our front desk. If you are over 50 years old - you may need Prevnar 14 or the adult Pneumonia vaccine.  Flu Shots will be available at our office starting mid- September. Please call and schedule a FLU CLINIC APPOINTMENT.   Continue to avoid heavy lifting pushing or pulling as much as possible Continue follow-up with cardiology Try gabapentin 100 mg at bedtime Return the FOBT Return to clinic for fasting lab work Do not forget to get your mammogram in the spring Maintain is good of a posture as possible    Arrie Senate MD

## 2014-07-26 ENCOUNTER — Telehealth: Payer: Self-pay | Admitting: *Deleted

## 2014-07-26 ENCOUNTER — Other Ambulatory Visit: Payer: Self-pay | Admitting: *Deleted

## 2014-07-26 DIAGNOSIS — R000 Tachycardia, unspecified: Secondary | ICD-10-CM

## 2014-07-26 NOTE — Telephone Encounter (Signed)
Received EOS monitor report in Dr. Myles Gip folder

## 2014-07-26 NOTE — Telephone Encounter (Signed)
Pt made aware, verbalized understanding.

## 2014-07-26 NOTE — Telephone Encounter (Signed)
-----   Message from Satira Sark, MD sent at 07/26/2014  1:10 PM EST ----- Reviewed. Please let her know that there were no significant rhythm changes during monitoring, sinus rhythm with only rare PVCs. Would continue current treatment.

## 2014-07-26 NOTE — Telephone Encounter (Signed)
Resulted by Dr. Domenic Polite sinus rhythm and sinus tach with rare PVC. LM for pt to call back. EOS report sent to be scanned

## 2014-08-01 ENCOUNTER — Other Ambulatory Visit: Payer: Self-pay | Admitting: Family Medicine

## 2014-08-07 ENCOUNTER — Encounter: Payer: Self-pay | Admitting: *Deleted

## 2014-08-17 ENCOUNTER — Ambulatory Visit (INDEPENDENT_AMBULATORY_CARE_PROVIDER_SITE_OTHER): Payer: Medicare Other | Admitting: Cardiology

## 2014-08-17 ENCOUNTER — Encounter: Payer: Self-pay | Admitting: Cardiology

## 2014-08-17 VITALS — BP 138/72 | HR 109 | Ht 64.0 in | Wt 143.0 lb

## 2014-08-17 DIAGNOSIS — I471 Supraventricular tachycardia: Secondary | ICD-10-CM | POA: Diagnosis not present

## 2014-08-17 MED ORDER — DILTIAZEM HCL ER COATED BEADS 240 MG PO CP24: 240.0000 mg | ORAL_CAPSULE | Freq: Every day | ORAL | Status: DC

## 2014-08-17 NOTE — Patient Instructions (Addendum)
Your physician wants you to follow-up in: 6 months You will receive a reminder letter in the mail two months in advance. If you don't receive a letter, please call our office to schedule the follow-up appointment.     Your physician has recommended you make the following change in your medication:     After you finish your Cardizem 180 mg daily, INCREASE to Cardizem 240 mg daily     Thank you for choosing [facility] !

## 2014-08-17 NOTE — Assessment & Plan Note (Signed)
Diagnosis not entirely certain, mostly historical. She has evidence of sinus rhythm and sinus tachycardia by most recent monitoring. Baseline heart rate is elevated. Cardizem CD will be increased to 240 mg daily for her next refill. Keep follow-up with Dr. Laurance Flatten. We will see her back in the next 6 months.

## 2014-08-17 NOTE — Progress Notes (Signed)
Reason for visit: Follow-up tachycardia  Clinical Summary Krystal [name] is a 74 y.o.femalelast seen in December 2015. At that time Cardizem CD was increased to 180 mg daily and cardiac monitoring was also obtained.  Cardiac monitor showed sinus rhythm and sinus tachycardia with rare PVCs. No atrial fibrillation or PSVT noted. We discussed the results today. She has not noticed a tremendous amount of difference on the higher dose Cardizem CD, although her heart rate is somewhat lower. We discussed continuing to uptitrate Cardizem CD further.   Allergies  Allergen Reactions  . Actonel [Risedronate] Nausea And Vomiting and Other (See Comments)    Throat tightness  . Fosamax [Alendronate Sodium] Other (See Comments)    esophagitis  . Mevacor [Lovastatin] Other (See Comments)    myalgias  . Miacalcin [Calcitonin ([place])] Nausea And Vomiting  . Mobic [Meloxicam] Hives and Itching    rash    Current Outpatient Prescriptions  Medication Sig Dispense Refill  . albuterol (PROAIR HFA) 108 (90 BASE) MCG/ACT inhaler Inhale 1 puff into the lungs 4 (four) times daily as needed for wheezing or shortness of breath.    Marland Kitchen aspirin EC 81 MG tablet Take 81 mg by mouth at bedtime.    . cetirizine (ZYRTEC) 10 MG tablet Take 10 mg by mouth at bedtime.     . Cholecalciferol (VITAMIN D) 2000 UNITS CAPS Take 1 capsule by mouth at bedtime.    . diazepam (VALIUM) 5 MG tablet TAKE ONE TABLET BY MOUTH EVERY SIX HOURS AS NEEDED 30 tablet 0  . ferrous sulfate 325 (65 FE) MG tablet Take 325 mg by mouth at bedtime.    . fish oil-omega-3 fatty acids 1000 MG capsule Take 1 g by mouth at bedtime.     . furosemide (LASIX) 20 MG tablet TAKE ONE TABLET BY MOUTH ONE  TIME DAILY 30 tablet 5  . ipratropium-albuterol (DUONEB) 0.5-2.5 (3) MG/3ML SOLN Take 3 mLs by nebulization every 6 (six) hours as needed. Dx:  Asthma 493.90 360 mL 2  . montelukast (SINGULAIR) 10 MG tablet TAKE ONE TABLET BY MOUTH AT BEDTIME 30 tablet 5  .  Multiple Vitamins-Minerals (PRESERVISION/LUTEIN PO) Take 1 capsule by mouth at bedtime.     Marland Kitchen NEXIUM 40 MG capsule TAKE ONE CAPSULE BY MOUTH ONE TIME DAILY 30 capsule 5  . oxyCODONE-acetaminophen (PERCOCET/ROXICET) 5-325 MG per tablet Take 1 tablet by mouth every 4 (four) hours as needed for severe pain (pain).    . simvastatin (ZOCOR) 10 MG tablet Take 5 mg by mouth daily. TAKE ONE TABLET BY MOUTH ONE TIME DAILY    . diltiazem (CARDIZEM CD) 240 MG 24 hr capsule Take 1 capsule (240 mg total) by mouth daily. 90 capsule 3   No current facility-administered medications for this visit.    Past Medical History  Diagnosis Date  . COPD (chronic obstructive pulmonary disease)   . Hyperlipidemia   . Leg edema   . Asthma   . Right knee pain   . Breast cancer     Right breast  . Brain tumor     1986  . Colon polyps   . Hiatal hernia     Social History Krystal [name] reports that she quit smoking about 53 years ago. Her smoking use included Cigarettes. She has never used smokeless tobacco. Krystal [name] reports that she does not drink alcohol.  Review of Systems Complete review of systems negative except as otherwise outlined in the clinical summary and also the following.no chest pain.  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  Physical Examination Filed Vitals:   08/17/14 1333  BP: 138/72  Pulse: 109   Filed Weights   08/17/14 1333  Weight: 143 lb (64.864 kg)   Elderly woman, no distress. HEENT: Conjunctiva and lids normal, oropharynx clear. Neck: Supple, no elevated JVP or carotid bruits, no thyromegaly. Lungs: Decreased breath sounds but clear, nonlabored breathing at rest. Cardiac: Regular rate and rhythm, no S3, soft systolic murmur, no pericardial rub. Abdomen: Soft, nontender, bowel sounds present, no guarding or rebound. Extremities: Trace ankle edema, distal pulses 2+.   Problem List and Plan   PSVT (paroxysmal supraventricular tachycardia) Diagnosis not  entirely certain, mostly historical. She has evidence of sinus rhythm and sinus tachycardia by most recent monitoring. Baseline heart rate is elevated. Cardizem CD will be increased to 240 mg daily for her next refill. Keep follow-up with Dr. Laurance [name]. We will see her back in the next 6 months.     Krystal [name], M.D., F.A.C.C.

## 2014-08-29 ENCOUNTER — Other Ambulatory Visit: Payer: Self-pay | Admitting: Cardiology

## 2014-09-05 ENCOUNTER — Telehealth: Payer: Self-pay | Admitting: Family Medicine

## 2014-09-05 MED ORDER — AZITHROMYCIN 250 MG PO TABS: ORAL_TABLET | ORAL | Status: DC

## 2014-09-05 NOTE — Telephone Encounter (Signed)
Sent to pharm and pt aware  

## 2014-09-05 NOTE — Telephone Encounter (Signed)
You can call in a Z-Pak for this patient if she is not allergic to it and remind her that if she does not do well she needs to come in and be checked

## 2014-09-14 ENCOUNTER — Other Ambulatory Visit: Payer: Self-pay | Admitting: Family Medicine

## 2014-09-14 NOTE — Telephone Encounter (Signed)
Patient was given a Zpak on 09/05/14. Left detailed message on home voicemail that even though you take this medication for 5 days it works for about 10. She should schedule an appointment if her symptoms have not resolved.

## 2014-10-18 ENCOUNTER — Encounter: Payer: Self-pay | Admitting: Pharmacist

## 2014-10-18 ENCOUNTER — Ambulatory Visit (INDEPENDENT_AMBULATORY_CARE_PROVIDER_SITE_OTHER): Payer: Medicare Other | Admitting: Pharmacist

## 2014-10-18 VITALS — BP 122/74 | HR 74 | Ht 64.0 in | Wt 145.0 lb

## 2014-10-18 DIAGNOSIS — Z0000 Encounter for general adult medical examination without abnormal findings: Secondary | ICD-10-CM | POA: Diagnosis not present

## 2014-10-18 DIAGNOSIS — I10 Essential (primary) hypertension: Secondary | ICD-10-CM | POA: Diagnosis not present

## 2014-10-18 DIAGNOSIS — H3530 Unspecified macular degeneration: Secondary | ICD-10-CM | POA: Diagnosis not present

## 2014-10-18 DIAGNOSIS — E785 Hyperlipidemia, unspecified: Secondary | ICD-10-CM

## 2014-10-18 DIAGNOSIS — Z131 Encounter for screening for diabetes mellitus: Secondary | ICD-10-CM | POA: Diagnosis not present

## 2014-10-18 DIAGNOSIS — Z79899 Other long term (current) drug therapy: Secondary | ICD-10-CM | POA: Diagnosis not present

## 2014-10-18 DIAGNOSIS — L659 Nonscarring hair loss, unspecified: Secondary | ICD-10-CM | POA: Diagnosis not present

## 2014-10-18 MED ORDER — SIMVASTATIN 10 MG PO TABS: 5.0000 mg | ORAL_TABLET | Freq: Every day | ORAL | Status: DC

## 2014-10-18 MED ORDER — ESOMEPRAZOLE MAGNESIUM 40 MG PO CPDR: 40.0000 mg | DELAYED_RELEASE_CAPSULE | Freq: Every day | ORAL | Status: DC

## 2014-10-18 MED ORDER — MONTELUKAST SODIUM 10 MG PO TABS: 10.0000 mg | ORAL_TABLET | Freq: Every day | ORAL | Status: DC

## 2014-10-18 MED ORDER — IPRATROPIUM-ALBUTEROL 0.5-2.5 (3) MG/3ML IN SOLN: 3.0000 mL | Freq: Four times a day (QID) | RESPIRATORY_TRACT | Status: DC | PRN

## 2014-10-18 MED ORDER — NITROGLYCERIN 0.4 MG/SPRAY TL SOLN: Status: DC

## 2014-10-18 NOTE — Progress Notes (Signed)
Patient ID: Krystal [name], female   DOB: [birth year], 74 y.o.   MRN: [id number]    Subjective:   Krystal [name] is a 74 y.o. female who presents for an Subsequent Medicare Annual Wellness Visit.  She requests prescriptions for 90 days supply and would like spray NTG inplace of tablets.  She reports needing NTG only about 3-4 times per year.  She also is concerned about hair loss which started 04/2014.     Current Medications (verified) Outpatient Encounter Prescriptions as of 10/18/2014  Medication Sig  . albuterol (PROAIR HFA) 108 (90 BASE) MCG/ACT inhaler Inhale 1 puff into the lungs 4 (four) times daily as needed for wheezing or shortness of breath.  Marland Kitchen aspirin EC 81 MG tablet Take 81 mg by mouth at bedtime.  . cetirizine (ZYRTEC) 10 MG tablet Take 10 mg by mouth at bedtime.   . Cholecalciferol (VITAMIN D) 2000 UNITS CAPS Take 1 capsule by mouth at bedtime.  . diazepam (VALIUM) 5 MG tablet TAKE ONE TABLET BY MOUTH EVERY SIX HOURS AS NEEDED  . diltiazem (CARDIZEM CD) 240 MG 24 hr capsule Take 1 capsule (240 mg total) by mouth daily.  Marland Kitchen esomeprazole (NEXIUM) 40 MG capsule Take 1 capsule (40 mg total) by mouth daily.  . ferrous sulfate 325 (65 FE) MG tablet Take 325 mg by mouth at bedtime.  . furosemide (LASIX) 20 MG tablet TAKE ONE TABLET BY MOUTH ONE  TIME DAILY (Patient taking differently: TAKE ONE TABLET BY MOUTH ONE  TIME DAILY prn)  . ipratropium-albuterol (DUONEB) 0.5-2.5 (3) MG/3ML SOLN Take 3 mLs by nebulization every 6 (six) hours as needed. Dx:  Asthma 493.90  . montelukast (SINGULAIR) 10 MG tablet Take 1 tablet (10 mg total) by mouth at bedtime.  . Multiple Vitamins-Minerals (PRESERVISION/LUTEIN PO) Take 1 capsule by mouth at bedtime.   . simvastatin (ZOCOR) 10 MG tablet Take 0.5 tablets (5 mg total) by mouth daily.  . [DISCONTINUED] ipratropium-albuterol (DUONEB) 0.5-2.5 (3) MG/3ML SOLN Take 3 mLs by nebulization every 6 (six) hours as needed. Dx:  Asthma 493.90  .  [DISCONTINUED] ipratropium-albuterol (DUONEB) 0.5-2.5 (3) MG/3ML SOLN Take 3 mLs by nebulization every 6 (six) hours as needed. Dx:  Asthma 493.90  . [DISCONTINUED] montelukast (SINGULAIR) 10 MG tablet TAKE ONE TABLET BY MOUTH AT BEDTIME  . [DISCONTINUED] NEXIUM 40 MG capsule TAKE ONE CAPSULE BY MOUTH ONE TIME DAILY  . [DISCONTINUED] simvastatin (ZOCOR) 10 MG tablet Take 5 mg by mouth daily.   . nitroGLYCERIN (NITROLINGUAL) 0.4 MG/SPRAY spray Use one spray under tongue as needed for chest pain. May repeat dose in 5 minutes. If chest pain not relieved than call 911.  . [DISCONTINUED] azithromycin (ZITHROMAX) 250 MG tablet As directed (Patient not taking: Reported on 10/18/2014)  . [DISCONTINUED] fish oil-omega-3 fatty acids 1000 MG capsule Take 1 g by mouth at bedtime.   . [DISCONTINUED] oxyCODONE-acetaminophen (PERCOCET/ROXICET) 5-325 MG per tablet Take 1 tablet by mouth every 4 (four) hours as needed for severe pain (pain).    Allergies (verified) Actonel; Fosamax; Mevacor; Miacalcin; and Mobic   History: Past Medical History  Diagnosis Date  . COPD (chronic obstructive pulmonary disease)   . Hyperlipidemia   . Leg edema   . Asthma   . Right knee pain   . Breast cancer     Right breast  . Brain tumor     1986  . Colon polyps   . Hiatal hernia   . Cataract   . Macular degeneration  Past Surgical History  Procedure Laterality Date  . Mastectomy Right   . Partial hysterectomy    . Craniectomy / craniotomy for excision of brain tumor      Left side brain  . Skin cancer excision      Under left eye  . Colonoscopy    . Polypectomy    . Abdominal hysterectomy    . Eye surgery     Family History  Problem Relation Age of Onset  . Heart disease Mother 65    Stroke   . Dementia Mother   . Congestive Heart Failure Mother   . Heart disease Sister   . Hypertension Sister   . Diabetes Sister   . Heart disease Brother   . Hyperlipidemia Brother   . Diabetes Brother   . Heart  disease Sister   . Hypertension Sister   . Diabetes Sister   . Diabetes Sister   . Hypertension Sister   . Diabetes Sister   . Hypertension Sister   . Diabetes Brother   . Hyperlipidemia Brother   . Colon cancer Neg Hx   . Rectal cancer Neg Hx   . Stomach cancer Neg Hx   . Esophageal cancer Neg Hx   . Cancer Father     lung  . Heart attack Father   . Diabetes Brother   . Heart disease Brother   . Hyperlipidemia Brother   . Diabetes Brother    Social History   Occupational History  . Not on file.   Social History Main Topics  . Smoking status: Former Smoker    Types: Cigarettes    Quit date: 02/09/1961  . Smokeless tobacco: Never Used  . Alcohol Use: No  . Drug Use: No  . Sexual Activity: No    Do you feel safe at home?  Yes  Dietary issues and exercise activities: Current Exercise Habits:: The patient does not participate in regular exercise at present  Current Dietary habits:  Patient is trying to limit fried foods and limits serving sizes.   Objective:    Today's Vitals   10/18/14 1117  BP: 122/74  Pulse: 74  Height: 5' 4"  (1.626 m)  Weight: 145 lb (65.772 kg)  PainSc: 0-No pain    Activities of Daily Living In your present state of health, do you have any difficulty performing the following activities: 10/18/2014 04/26/2014  Is the patient deaf or have difficulty hearing? N N  Hearing Y [employer] N  Difficulty concentrating or making decisions Y N  Walking or climbing stairs? N N  Doing errands, shopping? N N  Preparing Food and eating ? N -  Using the Toilet? N -  In the past six months, have you accidently leaked urine? Y -  Do you have problems with loss of bowel control? N -  Managing your Medications? Y -  Managing your Finances? N -  Housekeeping or managing your Housekeeping? N -    Are there smokers in your home (other than you)? No  . Cardiac Risk Factors include: advanced age (>63mn, >>36women);dyslipidemia;family history of  premature cardiovascular disease;hypertension;sedentary lifestyle  Depression Screen PHQ 2/9 Scores 10/18/2014 07/24/2014 06/05/2014 04/11/2014  PHQ - 2 Score 0 0 0 1    Fall Risk Fall Risk  10/18/2014 07/24/2014 06/05/2014 04/26/2014 04/11/2014  Falls in the past year? Yes Yes Yes Yes No  Number falls in past yr: 1 1 2  or more 2 or more -  Injury with Fall?  Yes Yes - - -  Risk Factor Category  High Fall Risk - High Fall Risk High Fall Risk -  Risk for fall due to : History of fall(s);Impaired balance/gait;Other (Comment) - Impaired balance/gait;Impaired vision - -  Risk for fall due to (comments): when patient fell had irregular HR - since fall medication has been adjusted to correct HR - - - -    Cognitive Function: MMSE - Mini Mental State Exam 10/18/2014  Orientation to time 5  Orientation to Place 5  Registration 3  Attention/ Calculation 5  Recall 3  Language- name 2 objects 2  Language- repeat 1  Language- follow 3 step command 3  Language- read & follow direction 1  Write a sentence 1  Copy design 1  Total score 30    Immunizations and Health Maintenance Immunization History  Administered Date(s) Administered  . Pneumococcal Conjugate-13 06/21/2013   Health Maintenance Due  Topic Date Due  . PNA vac Low Risk Adult (2 of 2 - PPSV23) 06/21/2014    Patient Care Team: Chipper Herb, MD as PCP - General (Family Medicine) Milus Banister, MD (Gastroenterology) Deeann Saint, MD as Consulting Physician (Hematology and Oncology) Sherlynn Stalls, MD as Consulting Physician (Ophthalmology) Satira Sark, MD as Consulting Physician (Cardiology) Okey Regal, OD as Consulting Physician (Optometry)  Indicate any recent Medical Services you may have received from other than [facility] providers in the past year (date may be approximate).    Assessment:    Annual Wellness Visit  Medication management Hair Loss HTN - controlled   Screening Tests Health Maintenance  Topic  Date Due  . PNA vac Low Risk Adult (2 of 2 - PPSV23) 06/21/2014  . ZOSTAVAX  02/05/2015 (Originally 04/27/1999)  . INFLUENZA VACCINE  03/05/2015 (Originally 03/04/2014)  . DEXA SCAN  07/14/2015  . TETANUS/TDAP  11/03/2015  . COLONOSCOPY  02/23/2017        Plan:   During the course of the visit Krystal [name] was educated and counseled about the following appropriate screening and preventive services:   Vaccines to include Pneumoccal, Influenza, Hepatitis B, Td, Zostavax - patient declined Zostavax but received pneumonia 23 (due to high risk condition / COPD) and Boostrix  Colorectal cancer screening - colonoscopy UTD;  Given FOBT in office today  Cardiovascular disease screening - UTD, sees Dr Domenic Polite  Diabetes screening - Checking BG today  Bone Denisty / Osteoporosis Screening - next due 07/2015  Mammogram - has appt 11/2014  PAP - refuses  Glaucoma screening / Diabetic Eye Exam - requesting last visit / records from [email or web address]  Advanced Directives - packet given in office today  Discussed increased physical activity though patient is doubtful she can increase due to taking care of mother.  Discussed options she can do inside.  Checking thyroid today related to hair loss.  Orders Placed This Encounter  Procedures  . CMP14+EGFR  . Thyroid Panel With TSH  . NMR, lipoprofile  . Magnesium     Patient Instructions (the written plan) were given to the patient.   Cherre Robins, [facility]   10/18/2014

## 2014-10-18 NOTE — Progress Notes (Signed)
Patient ID: Krystal [name], female   DOB: 11-May-1941, 74 y.o.   MRN: [id number]   Confirmed with Dr Earlie Server office that patient's last eye exam was in 2014 - will send referral for appt today.

## 2014-10-18 NOTE — Addendum Note (Signed)
Addended by: Cherre Robins on: 10/18/2014 01:05 PM   Modules accepted: Orders

## 2014-10-18 NOTE — Patient Instructions (Signed)
Health Maintenance Summary     Pneumonia Vaccine Completed Today     ZOSTAVAX / shingles vaccine Postponed 02/05/2015  Patient Declined    INFLUENZA VACCINE Postponed 03/05/2015  Patient Declined    DEXA SCAN Next Due 07/14/2015     TETANUS/TDAP Done done  Today Next due 10/17/2024   Mammogram Has Appt  For April 2016    Fecal Occult (stool) test Given in office today     COLONOSCOPY Next Due 02/23/2017        Preventive Care for Adults A healthy lifestyle and preventive care can promote health and wellness. Preventive health guidelines for women include the following key practices.  A routine yearly physical is a good way to check with your health care provider about your health and preventive screening. It is a chance to share any concerns and updates on your health and to receive a thorough exam.  Visit your dentist for a routine exam and preventive care every 6 months. Brush your teeth twice a day and floss once a day. Good oral hygiene prevents tooth decay and gum disease.  The frequency of eye exams is based on your age, health, family medical history, use of contact lenses, and other factors. Follow your health care provider's recommendations for frequency of eye exams.  Eat a healthy diet. Foods like vegetables, fruits, whole grains, low-fat dairy products, and lean protein foods contain the nutrients you need without too many calories. Decrease your intake of foods high in solid fats, added sugars, and salt. Eat the right amount of calories for you.Get information about a proper diet from your health care provider, if necessary.  Regular physical exercise is one of the most important things you can do for your health. Most adults should get at least 150 minutes of moderate-intensity exercise (any activity that increases your heart rate and causes you to sweat) each week. In addition, most adults need muscle-strengthening exercises on 2 or more days a week.  Maintain a healthy weight.  The body mass index (BMI) is a screening tool to identify possible weight problems. It provides an estimate of body fat based on height and weight. Your health care provider can find your BMI and can help you achieve or maintain a healthy weight.For adults 20 years and older:  A BMI below 18.5 is considered underweight.  A BMI of 18.5 to 24.9 is normal.  A BMI of 25 to 29.9 is considered overweight.  A BMI of 30 and above is considered obese.  Maintain normal blood lipids and cholesterol levels by exercising and minimizing your intake of saturated fat. Eat a balanced diet with plenty of fruit and vegetables. Blood tests for lipids and cholesterol should begin at age 9 and be repeated every 5 years. If your lipid or cholesterol levels are high, you are over 50, or you are at high risk for heart disease, you may need your cholesterol levels checked more frequently.Ongoing high lipid and cholesterol levels should be treated with medicines if diet and exercise are not working.  If you smoke, find out from your health care provider how to quit. If you do not use tobacco, do not start.  Lung cancer screening is recommended for adults aged 28-80 years who are at high risk for developing lung cancer because of a history of smoking. A yearly low-dose CT scan of the lungs is recommended for people who have at least a 30-pack-year history of smoking and are a current smoker or have quit within  the past 15 years. A pack year of smoking is smoking an average of 1 pack of cigarettes a day for 1 year (for example: 1 pack a day for 30 years or 2 packs a day for 15 years). Yearly screening should continue until the smoker has stopped smoking for at least 15 years. Yearly screening should be stopped for people who develop a health problem that would prevent them from having lung cancer treatment.  If you are pregnant, do not drink alcohol. If you are breastfeeding, be very cautious about drinking alcohol. If you  are not pregnant and choose to drink alcohol, do not have more than 1 drink per day. One drink is considered to be 12 ounces (355 mL) of beer, 5 ounces (148 mL) of wine, or 1.5 ounces (44 mL) of liquor.  Avoid use of street drugs. Do not share needles with anyone. Ask for help if you need support or instructions about stopping the use of drugs.  High blood pressure causes heart disease and increases the risk of stroke. Your blood pressure should be checked at least every 1 to 2 years. Ongoing high blood pressure should be treated with medicines if weight loss and exercise do not work.  If you are 50-28 years old, ask your health care provider if you should take aspirin to prevent strokes.  Diabetes screening involves taking a blood sample to check your fasting blood sugar level. This should be done once every 3 years, after age 61, if you are within normal weight and without risk factors for diabetes. Testing should be considered at a younger age or be carried out more frequently if you are overweight and have at least 1 risk factor for diabetes.  Breast cancer screening is essential preventive care for women. You should practice "breast self-awareness." This means understanding the normal appearance and feel of your breasts and may include breast self-examination. Any changes detected, no matter how small, should be reported to a health care provider. Women in their 59s and 30s should have a clinical breast exam (CBE) by a health care provider as part of a regular health exam every 1 to 3 years. After age 64, women should have a CBE every year. Starting at age 4, women should consider having a mammogram (breast X-ray test) every year. Women who have a family history of breast cancer should talk to their health care provider about genetic screening. Women at a high risk of breast cancer should talk to their health care providers about having an MRI and a mammogram every year.  Breast cancer gene  (BRCA)-related cancer risk assessment is recommended for women who have family members with BRCA-related cancers. BRCA-related cancers include breast, ovarian, tubal, and peritoneal cancers. Having family members with these cancers may be associated with an increased risk for harmful changes (mutations) in the breast cancer genes BRCA1 and BRCA2. Results of the assessment will determine the need for genetic counseling and BRCA1 and BRCA2 testing.  Routine pelvic exams to screen for cancer are no longer recommended for nonpregnant women who are considered low risk for cancer of the pelvic organs (ovaries, uterus, and vagina) and who do not have symptoms. Ask your health care provider if a screening pelvic exam is right for you.  If you have had past treatment for cervical cancer or a condition that could lead to cancer, you need Pap tests and screening for cancer for at least 20 years after your treatment. If Pap tests have been discontinued, your  risk factors (such as having a new sexual partner) need to be reassessed to determine if screening should be resumed. Some women have medical problems that increase the chance of getting cervical cancer. In these cases, your health care provider may recommend more frequent screening and Pap tests.  The HPV test is an additional test that may be used for cervical cancer screening. The HPV test looks for the virus that can cause the cell changes on the cervix. The cells collected during the Pap test can be tested for HPV. The HPV test could be used to screen women aged 49 years and older, and should be used in women of any age who have unclear Pap test results. After the age of 71, women should have HPV testing at the same frequency as a Pap test.  Colorectal cancer can be detected and often prevented. Most routine colorectal cancer screening begins at the age of 58 years and continues through age 96 years. However, your health care provider may recommend screening at  an earlier age if you have risk factors for colon cancer. On a yearly basis, your health care provider may provide home test kits to check for hidden blood in the stool. Use of a small camera at the end of a tube, to directly examine the colon (sigmoidoscopy or colonoscopy), can detect the earliest forms of colorectal cancer. Talk to your health care provider about this at age 76, when routine screening begins. Direct exam of the colon should be repeated every 5-10 years through age 65 years, unless early forms of pre-cancerous polyps or small growths are found.  People who are at an increased risk for hepatitis B should be screened for this virus. You are considered at high risk for hepatitis B if:  You were born in a country where hepatitis B occurs often. Talk with your health care provider about which countries are considered high risk.  Your parents were born in a high-risk country and you have not received a shot to protect against hepatitis B (hepatitis B vaccine).  You have HIV or AIDS.  You use needles to inject street drugs.  You live with, or have sex with, someone who has hepatitis B.  You get hemodialysis treatment.  You take certain medicines for conditions like cancer, organ transplantation, and autoimmune conditions.  Hepatitis C blood testing is recommended for all people born from [birth year] through 1965 and any individual with known risks for hepatitis C.  Practice safe sex. Use condoms and avoid high-risk sexual practices to reduce the spread of sexually transmitted infections (STIs). STIs include gonorrhea, chlamydia, syphilis, trichomonas, herpes, HPV, and human immunodeficiency virus (HIV). Herpes, HIV, and HPV are viral illnesses that have no cure. They can result in disability, cancer, and death.  You should be screened for sexually transmitted illnesses (STIs) including gonorrhea and chlamydia if:  You are sexually active and are younger than 24 years.  You are older  than 24 years and your health care provider tells you that you are at risk for this type of infection.  Your sexual activity has changed since you were last screened and you are at an increased risk for chlamydia or gonorrhea. Ask your health care provider if you are at risk.  If you are at risk of being infected with HIV, it is recommended that you take a prescription medicine daily to prevent HIV infection. This is called preexposure prophylaxis (PrEP). You are considered at risk if:  You are a heterosexual woman,  are sexually active, and are at increased risk for HIV infection.  You take drugs by injection.  You are sexually active with a partner who has HIV.  Talk with your health care provider about whether you are at high risk of being infected with HIV. If you choose to begin PrEP, you should first be tested for HIV. You should then be tested every 3 months for as long as you are taking PrEP.  Osteoporosis is a disease in which the bones lose minerals and strength with aging. This can result in serious bone fractures or breaks. The risk of osteoporosis can be identified using a bone density scan. Women ages 67 years and over and women at risk for fractures or osteoporosis should discuss screening with their health care providers. Ask your health care provider whether you should take a calcium supplement or vitamin D to reduce the rate of osteoporosis.  Menopause can be associated with physical symptoms and risks. Hormone replacement therapy is available to decrease symptoms and risks. You should talk to your health care provider about whether hormone replacement therapy is right for you.  Use sunscreen. Apply sunscreen liberally and repeatedly throughout the day. You should seek shade when your shadow is shorter than you. Protect yourself by wearing long sleeves, pants, a wide-brimmed hat, and sunglasses year round, whenever you are outdoors.  Once a month, do a whole body skin exam, using  a mirror to look at the skin on your back. Tell your health care provider of new moles, moles that have irregular borders, moles that are larger than a pencil eraser, or moles that have changed in shape or color.  Stay current with required vaccines (immunizations).  Influenza vaccine. All adults should be immunized every year.  Tetanus, diphtheria, and acellular pertussis (Td, Tdap) vaccine. Pregnant women should receive 1 dose of Tdap vaccine during each pregnancy. The dose should be obtained regardless of the length of time since the last dose. Immunization is preferred during the 27th-36th week of gestation. An adult who has not previously received Tdap or who does not know her vaccine status should receive 1 dose of Tdap. This initial dose should be followed by tetanus and diphtheria toxoids (Td) booster doses every 10 years. Adults with an unknown or incomplete history of completing a 3-dose immunization series with Td-containing vaccines should begin or complete a primary immunization series including a Tdap dose. Adults should receive a Td booster every 10 years.  Varicella vaccine. An adult without evidence of immunity to varicella should receive 2 doses or a second dose if she has previously received 1 dose. Pregnant females who do not have evidence of immunity should receive the first dose after pregnancy. This first dose should be obtained before leaving the health care facility. The second dose should be obtained 4-8 weeks after the first dose.  Human papillomavirus (HPV) vaccine. Females aged 13-26 years who have not received the vaccine previously should obtain the 3-dose series. The vaccine is not recommended for use in pregnant females. However, pregnancy testing is not needed before receiving a dose. If a female is found to be pregnant after receiving a dose, no treatment is needed. In that case, the remaining doses should be delayed until after the pregnancy. Immunization is recommended  for any person with an immunocompromised condition through the age of 34 years if she did not get any or all doses earlier. During the 3-dose series, the second dose should be obtained 4-8 weeks after the first  dose. The third dose should be obtained 24 weeks after the first dose and 16 weeks after the second dose.  Zoster vaccine. One dose is recommended for adults aged 52 years or older unless certain conditions are present.  Measles, mumps, and rubella (MMR) vaccine. Adults born before [birth year] generally are considered immune to measles and mumps. Adults born in [birth year] or later should have 1 or more doses of MMR vaccine unless there is a contraindication to the vaccine or there is laboratory evidence of immunity to each of the three diseases. A routine second dose of MMR vaccine should be obtained at least 28 days after the first dose for students attending postsecondary schools, health care workers, or international travelers. People who received inactivated measles vaccine or an unknown type of measles vaccine during 1963-1967 should receive 2 doses of MMR vaccine. People who received inactivated mumps vaccine or an unknown type of mumps vaccine before 1979 and are at high risk for mumps infection should consider immunization with 2 doses of MMR vaccine. For females of childbearing age, rubella immunity should be determined. If there is no evidence of immunity, females who are not pregnant should be vaccinated. If there is no evidence of immunity, females who are pregnant should delay immunization until after pregnancy. Unvaccinated health care workers born before [birth year] who lack laboratory evidence of measles, mumps, or rubella immunity or laboratory confirmation of disease should consider measles and mumps immunization with 2 doses of MMR vaccine or rubella immunization with 1 dose of MMR vaccine.  Pneumococcal 13-valent conjugate (PCV13) vaccine. When indicated, a person who is uncertain of her immunization  history and has no record of immunization should receive the PCV13 vaccine. An adult aged 68 years or older who has certain medical conditions and has not been previously immunized should receive 1 dose of PCV13 vaccine. This PCV13 should be followed with a dose of pneumococcal polysaccharide (PPSV23) vaccine. The PPSV23 vaccine dose should be obtained at least 8 weeks after the dose of PCV13 vaccine. An adult aged 77 years or older who has certain medical conditions and previously received 1 or more doses of PPSV23 vaccine should receive 1 dose of PCV13. The PCV13 vaccine dose should be obtained 1 or more years after the last PPSV23 vaccine dose.  Pneumococcal polysaccharide (PPSV23) vaccine. When PCV13 is also indicated, PCV13 should be obtained first. All adults aged 60 years and older should be immunized. An adult younger than age 36 years who has certain medical conditions should be immunized. Any person who resides in a nursing home or long-term care facility should be immunized. An adult smoker should be immunized. People with an immunocompromised condition and certain other conditions should receive both PCV13 and PPSV23 vaccines. People with human immunodeficiency virus (HIV) infection should be immunized as soon as possible after diagnosis. Immunization during chemotherapy or radiation therapy should be avoided. Routine use of PPSV23 vaccine is not recommended for American Indians, [place] Natives, or people younger than 65 years unless there are medical conditions that require PPSV23 vaccine. When indicated, people who have unknown immunization and have no record of immunization should receive PPSV23 vaccine. One-time revaccination 5 years after the first dose of PPSV23 is recommended for people aged 19-64 years who have chronic kidney failure, nephrotic syndrome, asplenia, or immunocompromised conditions. People who received 1-2 doses of PPSV23 before age 62 years should receive another dose of PPSV23  vaccine at age 47 years or later if at least 5 years have passed since the  previous dose. Doses of PPSV23 are not needed for people immunized with PPSV23 at or after age 48 years.  Meningococcal vaccine. Adults with asplenia or persistent complement component deficiencies should receive 2 doses of quadrivalent meningococcal conjugate (MenACWY-D) vaccine. The doses should be obtained at least 2 months apart. Microbiologists working with certain meningococcal bacteria, [place] recruits, people at risk during an outbreak, and people who travel to or live in countries with a high rate of meningitis should be immunized. A first-year college student up through age 69 years who is living in a residence hall should receive a dose if she did not receive a dose on or after her 16th birthday. Adults who have certain high-risk conditions should receive one or more doses of vaccine.  Hepatitis A vaccine. Adults who wish to be protected from this disease, have certain high-risk conditions, work with hepatitis A-infected animals, work in hepatitis A research labs, or travel to or work in countries with a high rate of hepatitis A should be immunized. Adults who were previously unvaccinated and who anticipate close contact with an international adoptee during the first 60 days after arrival in the [place] from a country with a high rate of hepatitis A should be immunized.  Hepatitis B vaccine. Adults who wish to be protected from this disease, have certain high-risk conditions, may be exposed to blood or other infectious body fluids, are household contacts or sex partners of hepatitis B positive people, are clients or workers in certain care facilities, or travel to or work in countries with a high rate of hepatitis B should be immunized.  Haemophilus influenzae type b (Hib) vaccine. A previously unvaccinated person with asplenia or sickle cell disease or having a scheduled splenectomy should receive 1 dose of Hib  vaccine. Regardless of previous immunization, a recipient of a hematopoietic stem cell transplant should receive a 3-dose series 6-12 months after her successful transplant. Hib vaccine is not recommended for adults with HIV infection. Preventive Services / Frequency Ages 53 years and over  Blood pressure check.** / Every 1 to 2 years.  Lipid and cholesterol check.** / Every 5 years beginning at age 3 years.  Lung cancer screening. / Every year if you are aged 57-80 years and have a 30-pack-year history of smoking and currently smoke or have quit within the past 15 years. Yearly screening is stopped once you have quit smoking for at least 15 years or develop a health problem that would prevent you from having lung cancer treatment.  Clinical breast exam.** / Every year after age 94 years.  BRCA-related cancer risk assessment.** / For women who have family members with a BRCA-related cancer (breast, ovarian, tubal, or peritoneal cancers).  Mammogram.** / Every year beginning at age 37 years and continuing for as long as you are in good health. Consult with your health care provider.  Pap test.** / Every 3 years starting at age 10 years through age 66 or 52 years with 3 consecutive normal Pap tests. Testing can be stopped between 65 and 70 years with 3 consecutive normal Pap tests and no abnormal Pap or HPV tests in the past 10 years.  HPV screening.** / Every 3 years from ages 34 years through ages 44 or 105 years with a history of 3 consecutive normal Pap tests. Testing can be stopped between 65 and 70 years with 3 consecutive normal Pap tests and no abnormal Pap or HPV tests in the past 10 years.  Fecal occult blood test (FOBT)  of stool. / Every year beginning at age 28 years and continuing until age 30 years. You may not need to do this test if you get a colonoscopy every 10 years.  Flexible sigmoidoscopy or colonoscopy.** / Every 5 years for a flexible sigmoidoscopy or every 10 years for a  colonoscopy beginning at age 44 years and continuing until age 7 years.  Hepatitis C blood test.** / For all people born from [birth year] through 1965 and any individual with known risks for hepatitis C.  Osteoporosis screening.** / A one-time screening for women ages 24 years and over and women at risk for fractures or osteoporosis.  Skin self-exam. / Monthly.  Influenza vaccine. / Every year.  Tetanus, diphtheria, and acellular pertussis (Tdap/Td) vaccine.** / 1 dose of Td every 10 years.  Varicella vaccine.** / Consult your health care provider.  Zoster vaccine.** / 1 dose for adults aged 12 years or older.  Pneumococcal 13-valent conjugate (PCV13) vaccine.** / Consult your health care provider.  Pneumococcal polysaccharide (PPSV23) vaccine.** / 1 dose for all adults aged 44 years and older.  Meningococcal vaccine.** / Consult your health care provider.  Hepatitis A vaccine.** / Consult your health care provider.  Hepatitis B vaccine.** / Consult your health care provider.  Haemophilus influenzae type b (Hib) vaccine.** / Consult your health care provider. ** Family history and personal history of risk and conditions may change your health care provider's recommendations. Document Released: 09/16/2001 Document Revised: 12/05/2013 Document Reviewed: 12/16/2010 [facility] Patient Information 2015 [place], [place]. This information is not intended to replace advice given to you by your health care provider. Make sure you discuss any questions you have with your health care provider.

## 2014-10-19 ENCOUNTER — Telehealth: Payer: Self-pay | Admitting: Pharmacist

## 2014-10-19 LAB — NMR, LIPOPROFILE
Cholesterol: 162 mg/dL (ref 100–199)
HDL Cholesterol by NMR: 72 mg/dL (ref >39)
HDL Particle Number: 38.7 umol/L (ref >=30.5)
LDL PARTICLE NUMBER: 746 nmol/L (ref <1000)
LDL Size: 20.7 nm (ref >20.5)
LDL-C: 75 mg/dL (ref 0–99)
LP-IR Score: <25 (ref <=45)
SMALL LDL PARTICLE NUMBER: 329 nmol/L (ref <=527)
TRIGLYCERIDES BY NMR: 76 mg/dL (ref 0–149)

## 2014-10-19 LAB — CMP14+EGFR
A/G RATIO: 2 (ref 1.1–2.5)
ALBUMIN: 4.2 g/dL (ref 3.5–4.8)
ALK PHOS: 48 IU/L (ref 39–117)
ALT: 11 IU/L (ref 0–32)
AST: 13 IU/L (ref 0–40)
BUN/Creatinine Ratio: 35 — ABNORMAL HIGH (ref 11–26)
BUN: 24 mg/dL (ref 8–27)
Bilirubin Total: 0.4 mg/dL (ref 0.0–1.2)
CHLORIDE: 103 mmol/L (ref 97–108)
CO2: 25 mmol/L (ref 18–29)
Calcium: 9.4 mg/dL (ref 8.7–10.3)
Creatinine, Ser: 0.68 mg/dL (ref 0.57–1.00)
GFR calc Af Amer: 99 mL/min/{1.73_m2} (ref >59)
GFR, EST NON AFRICAN AMERICAN: 86 mL/min/{1.73_m2} (ref >59)
Globulin, Total: 2.1 g/dL (ref 1.5–4.5)
Glucose: 101 mg/dL — ABNORMAL HIGH (ref 65–99)
POTASSIUM: 4.5 mmol/L (ref 3.5–5.2)
SODIUM: 144 mmol/L (ref 134–144)
Total Protein: 6.3 g/dL (ref 6.0–8.5)

## 2014-10-19 LAB — THYROID PANEL WITH TSH
FREE THYROXINE INDEX: 2 (ref 1.2–4.9)
T3 UPTAKE RATIO: 34 % (ref 24–39)
T4 TOTAL: 6 ug/dL (ref 4.5–12.0)
TSH: 2.25 u[IU]/mL (ref 0.450–4.500)

## 2014-10-19 LAB — MAGNESIUM: Magnesium: 2 mg/dL (ref 1.6–2.3)

## 2014-10-20 NOTE — Telephone Encounter (Signed)
Patient notified of lab results - all were WNL.

## 2014-10-23 ENCOUNTER — Telehealth: Payer: Self-pay | Admitting: Pharmacist

## 2014-10-23 NOTE — Telephone Encounter (Signed)
Rx was sent in with Dr Laurance Flatten as prescriber so I called [facility] to find out what was problem.  It appears that [facility] requires PA.  They are sending PA information by fax and I will also forward this message to Zadie Rhine and Erle Crocker who handle our PA.

## 2014-10-24 ENCOUNTER — Ambulatory Visit: Payer: Medicare Other

## 2014-10-24 ENCOUNTER — Ambulatory Visit (INDEPENDENT_AMBULATORY_CARE_PROVIDER_SITE_OTHER): Payer: Medicare Other | Admitting: Nurse Practitioner

## 2014-10-24 ENCOUNTER — Other Ambulatory Visit: Payer: Self-pay | Admitting: Nurse Practitioner

## 2014-10-24 VITALS — BP 135/75 | HR 89 | Temp 97.4°F | Ht 64.0 in | Wt 143.0 lb

## 2014-10-24 DIAGNOSIS — R0781 Pleurodynia: Secondary | ICD-10-CM | POA: Diagnosis not present

## 2014-10-24 DIAGNOSIS — M25552 Pain in left hip: Secondary | ICD-10-CM

## 2014-10-24 DIAGNOSIS — B029 Zoster without complications: Secondary | ICD-10-CM | POA: Diagnosis not present

## 2014-10-24 MED ORDER — VALACYCLOVIR HCL 500 MG PO TABS
500.0000 mg | ORAL_TABLET | Freq: Three times a day (TID) | ORAL | Status: DC
Start: 2014-10-24 — End: 2015-10-29

## 2014-10-24 MED ORDER — HYDROCODONE-ACETAMINOPHEN 5-325 MG PO TABS: 1.0000 | ORAL_TABLET | Freq: Four times a day (QID) | ORAL | Status: DC | PRN

## 2014-10-24 NOTE — Patient Instructions (Signed)

## 2014-10-24 NOTE — Progress Notes (Signed)
   Subjective:    Patient ID: Krystal [name], female    DOB: January 20, 1941, 74 y.o.   MRN: [id number]  HPI Patient in c/o left flank pain- slight rash- started 2 days ago.    Review of Systems  Constitutional: Negative.   HENT: Negative.   Respiratory: Negative.   Cardiovascular: Negative.   Gastrointestinal: Negative.   Genitourinary: Negative.   Neurological: Negative.   Psychiatric/Behavioral: Negative.   All other systems reviewed and are negative.      Objective:   Physical Exam  Constitutional: She appears well-developed and well-nourished.  Cardiovascular: Normal rate, regular rhythm and normal heart sounds.   Pulmonary/Chest: Effort normal and breath sounds normal.  Neurological: She is alert.  Skin: Rash (erythematous rash in linear pattern around left flank) noted.  Psychiatric: She has a normal mood and affect. Her behavior is normal. Judgment and thought content normal.   BP 135/75 mmHg  Pulse 89  Temp(Src) 97.4 F (36.3 C) (Oral)  Ht 5\' 4"  (1.626 m)  Wt 143 lb (64.864 kg)  BMI 24.53 kg/m2        Assessment & Plan:   1. Hip pain, left   2. Rib pain on left side   3. Shingles    Meds ordered this encounter  Medications  . valACYclovir (VALTREX) 500 MG tablet    Sig: Take 1 tablet (500 mg total) by mouth 3 (three) times daily.    Dispense:  21 tablet    Refill:  0    Order Specific Question:  Supervising Provider    Answer:  Chipper Herb [[id number]]  . HYDROcodone-acetaminophen (LORTAB) 5-325 MG per tablet    Sig: Take 1 tablet by mouth every 6 (six) hours as needed for moderate pain.    Dispense:  40 tablet    Refill:  0    Order Specific Question:  Supervising Provider    Answer:  Chipper Herb [[id number]]   Moist heat  Rest RTO prn  Mary-Margaret Hassell Done, FNP

## 2014-10-31 NOTE — Telephone Encounter (Signed)
This medication needed to be filed on medicare part B and called pharmacy and they took care of it.

## 2014-11-02 ENCOUNTER — Telehealth: Payer: Self-pay | Admitting: Nurse Practitioner

## 2014-11-02 ENCOUNTER — Other Ambulatory Visit: Payer: Self-pay | Admitting: Family Medicine

## 2014-11-02 DIAGNOSIS — C50919 Malignant neoplasm of unspecified site of unspecified female breast: Secondary | ICD-10-CM

## 2014-11-02 NOTE — Telephone Encounter (Signed)
Is okay to make the requested change for the mastectomy bras with refills for one year

## 2014-11-03 NOTE — Telephone Encounter (Signed)
Please call in valium with 1 refills

## 2014-11-03 NOTE — Telephone Encounter (Signed)
Last seen 10/24/14 MMM If approved route to nurse to call into [facility]

## 2014-11-03 NOTE — Telephone Encounter (Signed)
Refill called in. 

## 2014-11-03 NOTE — Telephone Encounter (Signed)
lmovm that this has been written/printed, but it will be ready Monday afternoon

## 2014-11-06 ENCOUNTER — Telehealth: Payer: Self-pay | Admitting: Family Medicine

## 2014-11-07 ENCOUNTER — Other Ambulatory Visit: Payer: Self-pay | Admitting: Family Medicine

## 2014-11-08 NOTE — Telephone Encounter (Signed)
Handled in another telephone encounter

## 2014-11-15 DIAGNOSIS — H5201 Hypermetropia, right eye: Secondary | ICD-10-CM | POA: Diagnosis not present

## 2014-11-15 DIAGNOSIS — H3531 Nonexudative age-related macular degeneration: Secondary | ICD-10-CM | POA: Diagnosis not present

## 2014-11-15 DIAGNOSIS — H5212 Myopia, left eye: Secondary | ICD-10-CM | POA: Diagnosis not present

## 2014-11-15 DIAGNOSIS — H52223 Regular astigmatism, bilateral: Secondary | ICD-10-CM | POA: Diagnosis not present

## 2014-11-15 DIAGNOSIS — H3532 Exudative age-related macular degeneration: Secondary | ICD-10-CM | POA: Diagnosis not present

## 2014-11-15 DIAGNOSIS — H524 Presbyopia: Secondary | ICD-10-CM | POA: Diagnosis not present

## 2014-11-15 DIAGNOSIS — H43813 Vitreous degeneration, bilateral: Secondary | ICD-10-CM | POA: Diagnosis not present

## 2014-11-17 IMAGING — CR DG CHEST 2V
2 series · 2 of 2 positions shown · non-contrast
Comparison: None

CLINICAL DATA: Asthma

EXAM:
CHEST  2 VIEW

[view not recorded (1 of 2)]
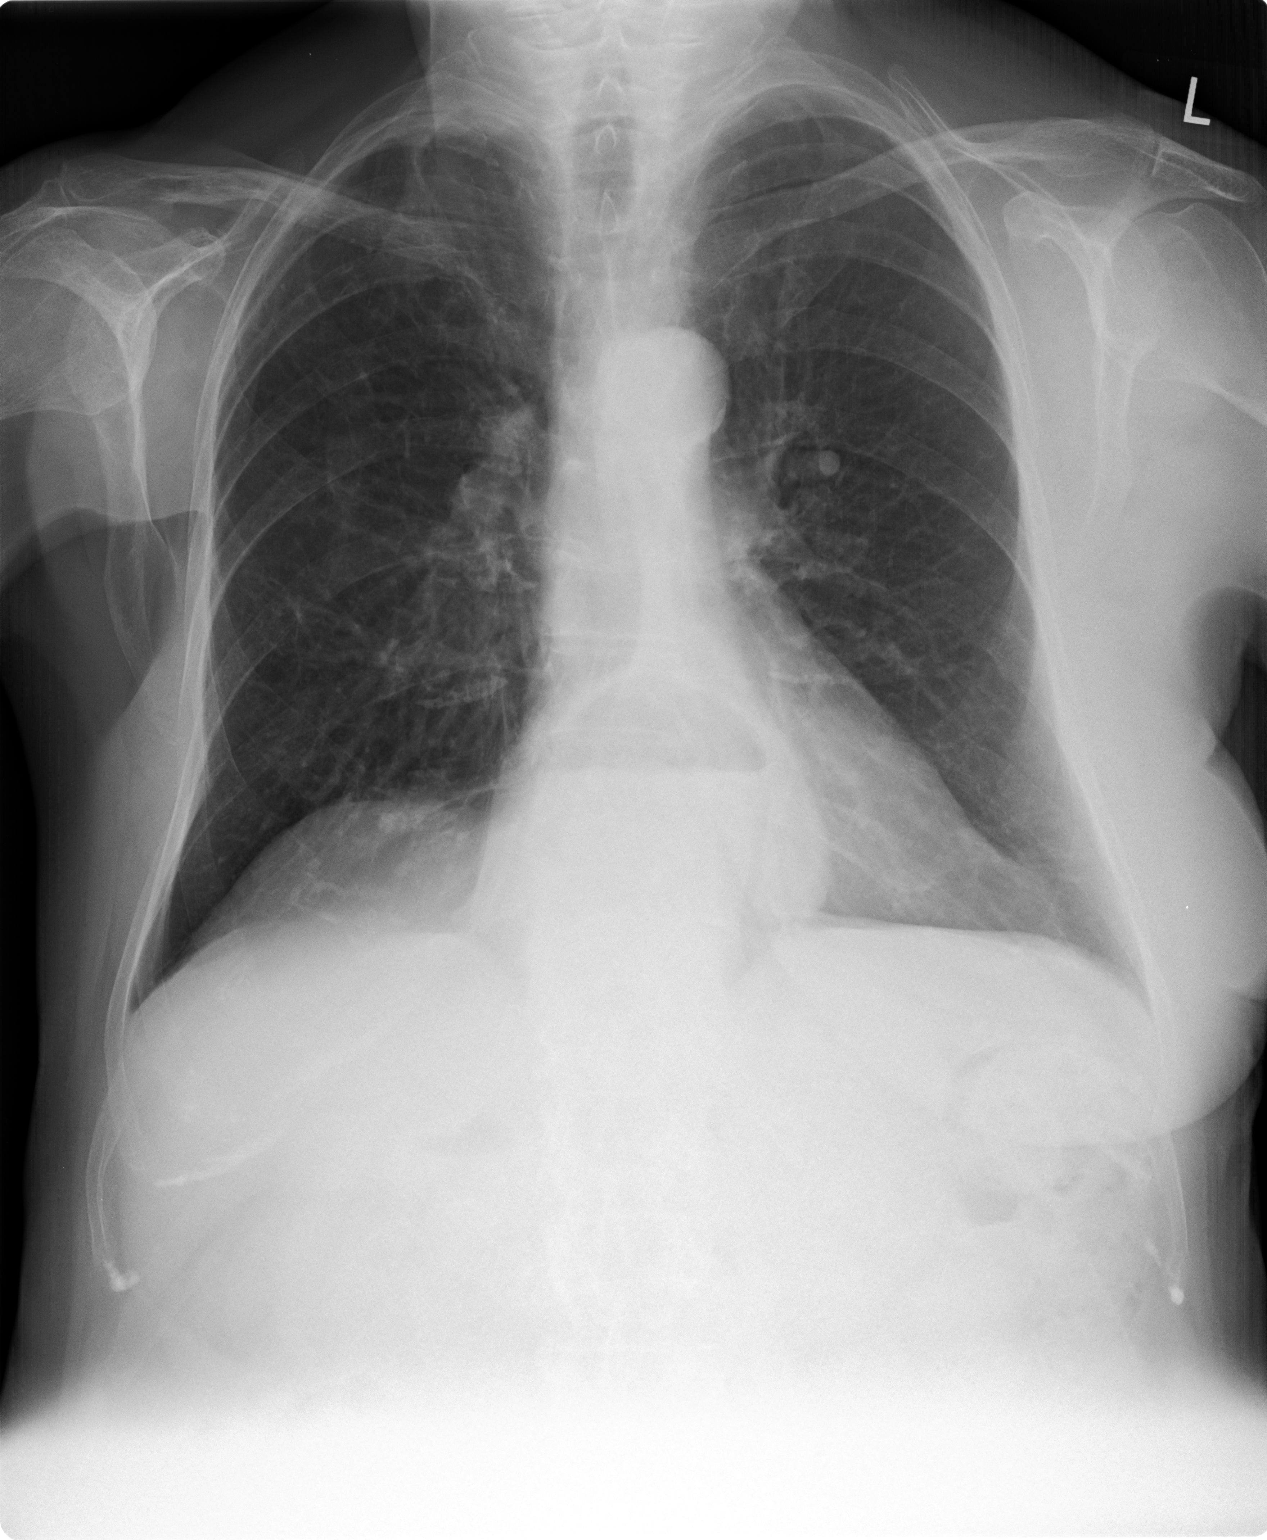

[view not recorded (2 of 2)]
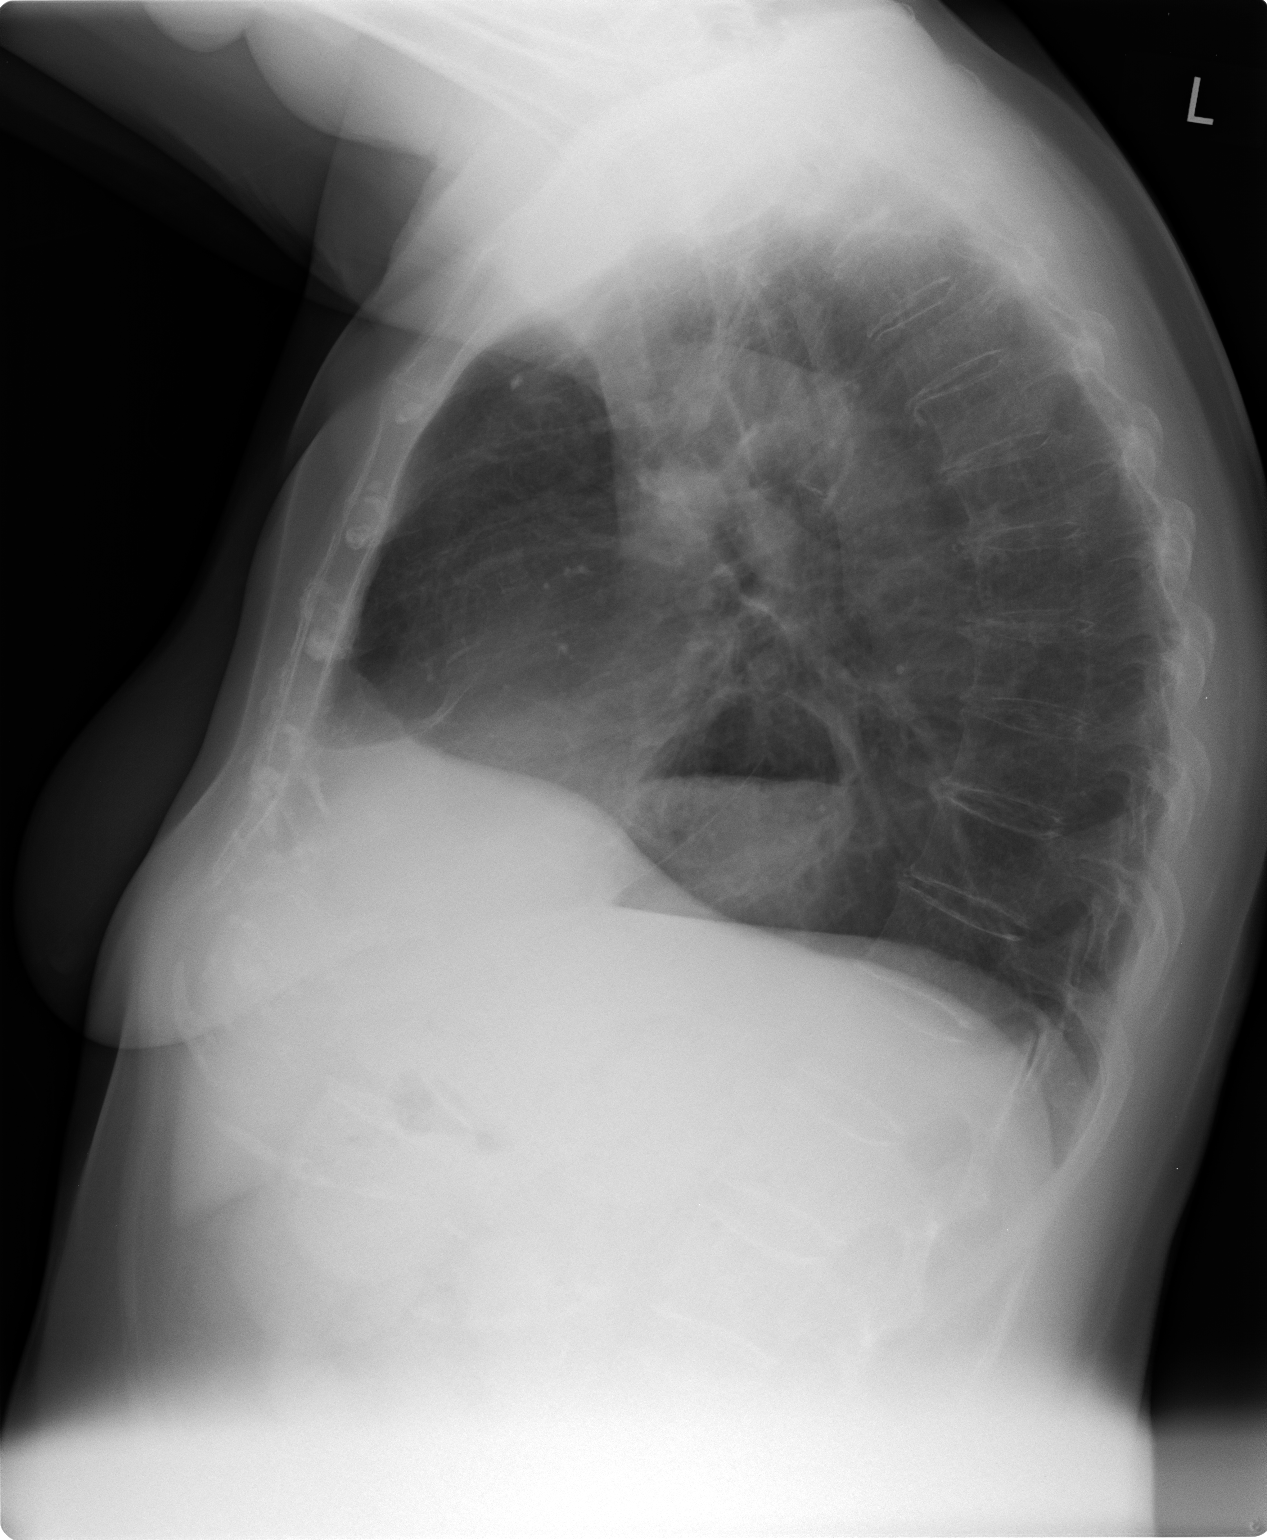

[2 of 2 positions shown; findings below may reference images not displayed]

FINDINGS: There is a moderate to large hiatal hernia with air-fluid level. The
heart size and vascular pattern are normal. Lungs are clear with no
pleural effusions.
IMPRESSION: No acute findings.  Moderate to large hiatal hernia.

## 2014-11-24 ENCOUNTER — Ambulatory Visit: Payer: Medicare Other | Admitting: Family Medicine

## 2014-11-27 ENCOUNTER — Ambulatory Visit (INDEPENDENT_AMBULATORY_CARE_PROVIDER_SITE_OTHER): Payer: Medicare Other | Admitting: Family Medicine

## 2014-11-27 ENCOUNTER — Encounter: Payer: Self-pay | Admitting: Family Medicine

## 2014-11-27 VITALS — BP 108/60 | HR 95 | Temp 97.3°F | Ht 64.0 in | Wt 142.0 lb

## 2014-11-27 DIAGNOSIS — L659 Nonscarring hair loss, unspecified: Secondary | ICD-10-CM

## 2014-11-27 DIAGNOSIS — Z79899 Other long term (current) drug therapy: Secondary | ICD-10-CM

## 2014-11-27 DIAGNOSIS — Z9011 Acquired absence of right breast and nipple: Secondary | ICD-10-CM | POA: Diagnosis not present

## 2014-11-27 DIAGNOSIS — I10 Essential (primary) hypertension: Secondary | ICD-10-CM | POA: Diagnosis not present

## 2014-11-27 DIAGNOSIS — E559 Vitamin D deficiency, unspecified: Secondary | ICD-10-CM

## 2014-11-27 DIAGNOSIS — B0229 Other postherpetic nervous system involvement: Secondary | ICD-10-CM

## 2014-11-27 DIAGNOSIS — Z1231 Encounter for screening mammogram for malignant neoplasm of breast: Secondary | ICD-10-CM | POA: Diagnosis not present

## 2014-11-27 DIAGNOSIS — D509 Iron deficiency anemia, unspecified: Secondary | ICD-10-CM

## 2014-11-27 DIAGNOSIS — E785 Hyperlipidemia, unspecified: Secondary | ICD-10-CM

## 2014-11-27 MED ORDER — LIDOCAINE 5 % EX PTCH: 1.0000 | MEDICATED_PATCH | CUTANEOUS | Status: DC

## 2014-11-27 NOTE — Progress Notes (Signed)
Subjective:    Patient ID: Krystal [name], female    DOB: [birth year], 74 y.o.   MRN: [id number]  HPI Pt here for follow up and management of chronic medical problems which includes hypertension and hyperlipidemia. She is taking medications regularly. The patient is post taking medication for the shingles on her left side about 4 weeks and still having quite a bit of discomfort on her left side. The rash is cleared as far as any blisters of concern. 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 The patient's mother is bed confined. She just had her mammogram done today. She denies chest pain other than if her heart races real fast and she has chest discomfort otherwise she does not have any problems with her chest. Her bowels move weekly and this is normal for her.     Patient Active Problem List   Diagnosis Date Noted  . PSVT (paroxysmal supraventricular tachycardia) 05/10/2014  . Macular degeneration, age related, nonexudative 08/24/2013  . History of right mastectomy 07/13/2013  . Anemia, iron deficiency 06/21/2013  . Allergic rhinitis 06/21/2013  . Chronic low back pain 06/21/2013  . NEOPLASM, MALIGNANT, BREAST 11/22/2007  . Mixed hyperlipidemia 11/22/2007  . ANXIETY 11/22/2007  . DEPRESSION 11/22/2007  . ASTHMA, UNSPECIFIED, UNSPECIFIED STATUS 11/22/2007  . GERD 11/22/2007  . GASTRITIS, WITH HEMORRHAGE 08/14/2007  . DUODENITIS, WITHOUT HEMORRHAGE 08/14/2007  . HIATAL HERNIA 08/14/2007  . 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   Outpatient Encounter Prescriptions as of 11/27/2014  Medication Sig  . albuterol (PROAIR HFA) 108 (90 BASE) MCG/ACT inhaler Inhale 1 puff into the lungs 4 (four) times daily as needed for wheezing or shortness of breath.  Marland Kitchen aspirin EC 81 MG tablet Take 81 mg by mouth at bedtime.  . cetirizine (ZYRTEC) 10 MG tablet Take 10 mg by mouth at bedtime.   . Cholecalciferol (VITAMIN D) 2000  UNITS CAPS Take 1 capsule by mouth at bedtime.  . diazepam (VALIUM) 5 MG tablet TAKE ONE TABLET BY MOUTH EVERY SIX HOURS AS NEEDED  . diltiazem (CARDIZEM CD) 240 MG 24 hr capsule Take 1 capsule (240 mg total) by mouth daily.  Marland Kitchen esomeprazole (NEXIUM) 40 MG capsule Take 1 capsule (40 mg total) by mouth daily.  . ferrous sulfate 325 (65 FE) MG tablet Take 325 mg by mouth at bedtime.  . furosemide (LASIX) 20 MG tablet TAKE ONE TABLET BY MOUTH ONE  TIME DAILY (Patient taking differently: TAKE ONE TABLET BY MOUTH ONE  TIME DAILY prn)  . montelukast (SINGULAIR) 10 MG tablet Take 1 tablet (10 mg total) by mouth at bedtime.  . Multiple Vitamins-Minerals (PRESERVISION/LUTEIN PO) Take 1 capsule by mouth at bedtime.   . nitroGLYCERIN (NITROLINGUAL) 0.4 MG/SPRAY spray Use one spray under tongue as needed for chest pain. May repeat dose in 5 minutes. If chest pain not relieved than call 911.  . simvastatin (ZOCOR) 10 MG tablet Take 0.5 tablets (5 mg total) by mouth daily.  . valACYclovir (VALTREX) 500 MG tablet Take 1 tablet (500 mg total) by mouth 3 (three) times daily.  . [DISCONTINUED] HYDROcodone-acetaminophen (LORTAB) 5-325 MG per tablet Take 1 tablet by mouth every 6 (six) hours as needed for moderate pain.  . [DISCONTINUED] ipratropium-albuterol (DUONEB) 0.5-2.5 (3) MG/3ML SOLN Take 3 mLs by nebulization every 6 (six) hours as needed. Dx:  Asthma 493.90    Review of Systems  Constitutional: Negative.   HENT: Negative.  Eyes: Negative.   Respiratory: Negative.   Cardiovascular: Negative.   Gastrointestinal: Negative.   Endocrine: Negative.   Genitourinary: Negative.   Musculoskeletal: Negative.   Skin:       Shingles - left side  Allergic/Immunologic: Negative.   Neurological: Negative.   Hematological: Negative.   Psychiatric/Behavioral: Negative.        Objective:   Physical Exam  Constitutional: She is oriented to person, place, and time. She appears well-nourished. No distress.  The  patient is alert and somewhat kyphotic.  HENT:  Head: Normocephalic and atraumatic.  Right Ear: External ear normal.  Left Ear: External ear normal.  Nose: Nose normal.  Mouth/Throat: Oropharynx is clear and moist.  Eyes: Conjunctivae and EOM are normal. Pupils are equal, round, and reactive to light. Right eye exhibits no discharge. Left eye exhibits no discharge. No scleral icterus.  Neck: Normal range of motion. Neck supple. No thyromegaly present.  Cardiovascular: Normal rate, regular rhythm, normal heart sounds and intact distal pulses.   No murmur heard. At 96/m  Pulmonary/Chest: Effort normal and breath sounds normal. No respiratory distress. She has no wheezes. She has no rales. She exhibits no tenderness.  Clear anteriorly and posteriorly  Abdominal: Soft. Bowel sounds are normal. She exhibits no mass. There is no tenderness. There is no rebound and no guarding.  Abdomen soft without masses  Genitourinary:  The patient has had a right mastectomy. The left breast has some thickening in the upper outer quadrant but no discrete mass. The axilla on both sides were negative. She had a mammogram today. We do not have the results of this.  Musculoskeletal: Normal range of motion. She exhibits no edema or tenderness.  Lymphadenopathy:    She has no cervical adenopathy.  Neurological: She is alert and oriented to person, place, and time. She has normal reflexes. No cranial nerve deficit.  Skin: Skin is warm and dry. Rash noted. There is erythema. No pallor.  There is resolving erythema on the left flank with no open sores or vesicles.  Psychiatric: She has a normal mood and affect. Her behavior is normal. Judgment and thought content normal.  Nursing note and vitals reviewed.  BP 108/60 mmHg  Pulse 95  Temp(Src) 97.3 F (36.3 C) (Oral)  Ht 5\' 4"  (1.626 m)  Wt 142 lb (64.411 kg)  BMI 24.36 kg/m2        Assessment & Plan:  1. Essential hypertension -The blood pressure is good  today and the patient should continue with her current treatment  2. Hyperlipidemia -CHOLESTEROL numbers were excellent and she should continue with her simvastatin.  3. High risk medications (not anticoagulants) long-term use -The patient's heart rhythm was regular today and she should continue with her current medication.  4. Vitamin D deficiency -The patient should continue with her current dose of vitamin D.  5. Anemia, iron deficiency -The patient's last hemoglobin was good  6. Post herpetic neuralgia - lidocaine (LIDODERM) 5 %; Place 1 patch onto the skin daily. Remove & Discard patch within 12 hours or as directed by MD  Dispense: 30 patch; Refill: 0  7. History of right mastectomy -The patient had a mammogram done today we're awaiting the results of this.  8. Hair loss -She has had a recent thyroid profile this was within normal limits.  Meds ordered this encounter  Medications  . lidocaine (LIDODERM) 5 %    Sig: Place 1 patch onto the skin daily. Remove & Discard patch within 12 hours  or as directed by MD    Dispense:  30 patch    Refill:  0   Patient Instructions                       Medicare Annual Wellness Visit  [facility] and the medical providers at [facility] strive to bring you the best medical care.  In doing so we not only want to address your current medical conditions and concerns but also to detect new conditions early and prevent illness, disease and health-related problems.    Medicare offers a yearly Wellness Visit which allows our clinical staff to assess your need for preventative services including immunizations, lifestyle education, counseling to decrease risk of preventable diseases and screening for fall risk and other medical concerns.    This visit is provided free of charge (no copay) for all Medicare recipients. The clinical pharmacists at [facility] have begun to conduct these Wellness Visits  which will also include a thorough review of all your medications.    As you primary medical provider recommend that you make an appointment for your Annual Wellness Visit if you have not done so already this year.  You may set up this appointment before you leave today or you may call back ([phone]) and schedule an appointment.  Please make sure when you call that you mention that you are scheduling your Annual Wellness Visit with the clinical pharmacist so that the appointment may be made for the proper length of time.     Continue current medications. Continue good therapeutic lifestyle changes which include good diet and exercise. Fall precautions discussed with patient. If an FOBT was given today- please return it to our front desk. If you are over 77 years old - you may need Prevnar 15 or the adult Pneumonia vaccine.  Flu Shots are still available at our office. If you still haven't had one please call to set up a nurse visit to get one.   After your visit with [place] today you will receive a survey in the mail or online from [employer] regarding your care with [place]. Please take a moment to fill this out. Your feedback is very important to [place] as you can help [place] better understand your patient needs as well as improve your experience and satisfaction. WE CARE ABOUT YOU!!!   The patient should continue with her current medication She should try the Lidoderm patch to see if this will help relieve the pain on her side by wearing this no more than 12 hours once a day. She should return the FOBT. If she continues to have problems with her back she should return to the office and let us get thoracic and lumbar spine films.   Arrie Senate MD

## 2014-11-27 NOTE — Patient Instructions (Addendum)
Medicare Annual Wellness Visit  [facility] and the medical providers at [facility] strive to bring you the best medical care.  In doing so we not only want to address your current medical conditions and concerns but also to detect new conditions early and prevent illness, disease and health-related problems.    Medicare offers a yearly Wellness Visit which allows our clinical staff to assess your need for preventative services including immunizations, lifestyle education, counseling to decrease risk of preventable diseases and screening for fall risk and other medical concerns.    This visit is provided free of charge (no copay) for all Medicare recipients. The clinical pharmacists at [facility] have begun to conduct these Wellness Visits which will also include a thorough review of all your medications.    As you primary medical provider recommend that you make an appointment for your Annual Wellness Visit if you have not done so already this year.  You may set up this appointment before you leave today or you may call back ([phone]) and schedule an appointment.  Please make sure when you call that you mention that you are scheduling your Annual Wellness Visit with the clinical pharmacist so that the appointment may be made for the proper length of time.     Continue current medications. Continue good therapeutic lifestyle changes which include good diet and exercise. Fall precautions discussed with patient. If an FOBT was given today- please return it to our front desk. If you are over 48 years old - you may need Prevnar 88 or the adult Pneumonia vaccine.  Flu Shots are still available at our office. If you still haven't had one please call to set up a nurse visit to get one.   After your visit with [place] today you will receive a survey in the mail or online from [employer] regarding your care with [place]. Please take a moment to  fill this out. Your feedback is very important to [place] as you can help [place] better understand your patient needs as well as improve your experience and satisfaction. WE CARE ABOUT YOU!!!   The patient should continue with her current medication She should try the Lidoderm patch to see if this will help relieve the pain on her side by wearing this no more than 12 hours once a day. She should return the FOBT. If she continues to have problems with her back she should return to the office and let us get thoracic and lumbar spine films.

## 2014-11-28 ENCOUNTER — Telehealth: Payer: Self-pay

## 2014-11-28 NOTE — Telephone Encounter (Signed)
Insurance approved prior authorization for Lidocaine 08/30/14 to 11/28/15

## 2014-12-07 ENCOUNTER — Other Ambulatory Visit: Payer: Self-pay | Admitting: Family Medicine

## 2014-12-19 DIAGNOSIS — H35051 Retinal neovascularization, unspecified, right eye: Secondary | ICD-10-CM | POA: Diagnosis not present

## 2014-12-19 DIAGNOSIS — H43811 Vitreous degeneration, right eye: Secondary | ICD-10-CM | POA: Diagnosis not present

## 2014-12-19 DIAGNOSIS — H3531 Nonexudative age-related macular degeneration: Secondary | ICD-10-CM | POA: Diagnosis not present

## 2014-12-19 DIAGNOSIS — H3532 Exudative age-related macular degeneration: Secondary | ICD-10-CM | POA: Diagnosis not present

## 2014-12-27 ENCOUNTER — Encounter: Payer: Self-pay | Admitting: Family Medicine

## 2015-01-02 ENCOUNTER — Other Ambulatory Visit: Payer: Self-pay | Admitting: Family Medicine

## 2015-01-23 DIAGNOSIS — H3532 Exudative age-related macular degeneration: Secondary | ICD-10-CM | POA: Diagnosis not present

## 2015-01-23 DIAGNOSIS — H35051 Retinal neovascularization, unspecified, right eye: Secondary | ICD-10-CM | POA: Diagnosis not present

## 2015-02-02 ENCOUNTER — Other Ambulatory Visit: Payer: Self-pay | Admitting: Nurse Practitioner

## 2015-02-02 NOTE — Telephone Encounter (Signed)
rx called into pharmacy

## 2015-02-02 NOTE — Telephone Encounter (Signed)
Please call in diazepam with   1 refills 

## 2015-02-02 NOTE — Telephone Encounter (Signed)
Last seen 11/27/14  Dr Laurance Flatten  If approved route to nurse to call into [facility]

## 2015-02-20 DIAGNOSIS — H43811 Vitreous degeneration, right eye: Secondary | ICD-10-CM | POA: Diagnosis not present

## 2015-02-20 DIAGNOSIS — H3532 Exudative age-related macular degeneration: Secondary | ICD-10-CM | POA: Diagnosis not present

## 2015-02-20 DIAGNOSIS — H35051 Retinal neovascularization, unspecified, right eye: Secondary | ICD-10-CM | POA: Diagnosis not present

## 2015-02-20 DIAGNOSIS — H3531 Nonexudative age-related macular degeneration: Secondary | ICD-10-CM | POA: Diagnosis not present

## 2015-03-23 ENCOUNTER — Encounter: Payer: Self-pay | Admitting: Cardiology

## 2015-03-23 ENCOUNTER — Ambulatory Visit (INDEPENDENT_AMBULATORY_CARE_PROVIDER_SITE_OTHER): Payer: Medicare Other | Admitting: Cardiology

## 2015-03-23 VITALS — BP 110/58 | HR 99 | Ht 65.0 in | Wt 151.8 lb

## 2015-03-23 DIAGNOSIS — E782 Mixed hyperlipidemia: Secondary | ICD-10-CM | POA: Diagnosis not present

## 2015-03-23 DIAGNOSIS — I471 Supraventricular tachycardia: Secondary | ICD-10-CM | POA: Diagnosis not present

## 2015-03-23 NOTE — Progress Notes (Signed)
Cardiology Office Note  Date: 03/23/2015   ID: Krystal [name], DOB 12/30/1940, MRN [id number]  PCP: Redge Gainer, MD  Primary Cardiologist: Rozann Lesches, MD   Chief Complaint  Patient presents with  . History of PSVT    History of Present Illness: Krystal [name] is a 74 y.o. female last seen in January. She presents for a follow-up visit. Since increasing Cardizem CD dose, she reports improvement in her palpitations.  She continues to follow with Dr. Laurance Flatten for primary care. Most recent lab work and echocardiogram are outlined below.  Past Medical History  Diagnosis Date  . COPD (chronic obstructive pulmonary disease)   . Hyperlipidemia   . Leg edema   . Asthma   . Right knee pain   . Breast cancer     Right breast  . Brain tumor     1986  . Colon polyps   . Hiatal hernia   . Cataract   . Macular degeneration   . PVD (posterior vitreous detachment)     Current Outpatient Prescriptions  Medication Sig Dispense Refill  . albuterol (PROAIR HFA) 108 (90 BASE) MCG/ACT inhaler Inhale 1 puff into the lungs 4 (four) times daily as needed for wheezing or shortness of breath.    Marland Kitchen aspirin EC 81 MG tablet Take 81 mg by mouth at bedtime.    . cetirizine (ZYRTEC) 10 MG tablet Take 10 mg by mouth at bedtime.     . Cholecalciferol (VITAMIN D) 2000 UNITS CAPS Take 1 capsule by mouth at bedtime.    . diazepam (VALIUM) 5 MG tablet TAKE ONE TABLET BY MOUTH EVERY SIX HOURS AS NEEDED 30 tablet 0  . diltiazem (CARDIZEM CD) 240 MG 24 hr capsule Take 1 capsule (240 mg total) by mouth daily. 90 capsule 3  . esomeprazole (NEXIUM) 40 MG capsule Take 1 capsule (40 mg total) by mouth daily. 90 capsule 1  . ferrous sulfate 325 (65 FE) MG tablet Take 325 mg by mouth at bedtime.    . furosemide (LASIX) 20 MG tablet TAKE ONE TABLET BY MOUTH ONE TIME DAILY 30 tablet 2  . gabapentin (NEURONTIN) 100 MG capsule TAKE ONE CAPSULE BY MOUTH AT BEDTIME 30 capsule 5  . lidocaine (LIDODERM) 5 %  Place 1 patch onto the skin daily. Remove & Discard patch within 12 hours or as directed by MD 30 patch 0  . montelukast (SINGULAIR) 10 MG tablet Take 1 tablet (10 mg total) by mouth at bedtime. 90 tablet 1  . Multiple Vitamins-Minerals (PRESERVISION/LUTEIN PO) Take 1 capsule by mouth at bedtime.     . nitroGLYCERIN (NITROLINGUAL) 0.4 MG/SPRAY spray Use one spray under tongue as needed for chest pain. May repeat dose in 5 minutes. If chest pain not relieved than call 911. 12 g 1  . simvastatin (ZOCOR) 10 MG tablet Take 0.5 tablets (5 mg total) by mouth daily. 45 tablet 1  . valACYclovir (VALTREX) 500 MG tablet Take 1 tablet (500 mg total) by mouth 3 (three) times daily. 21 tablet 0   No current facility-administered medications for this visit.    Allergies:  Actonel; Fosamax; Mevacor; Miacalcin; and Mobic   Social History: The patient  reports that she quit smoking about 54 years ago. Her smoking use included Cigarettes. She has never used smokeless tobacco. She reports that she does not drink alcohol or use illicit drugs.   ROS:  Please see the history of present illness. Otherwise, complete review of systems is positive for  none.  All other systems are reviewed and negative.   Physical Exam: VS:  BP 110/58 mmHg  Pulse 99  Ht 5\' 5"  (1.651 m)  Wt 151 lb 12.8 oz (68.856 kg)  BMI 25.26 kg/m2  SpO2 94%, BMI Body mass index is 25.26 kg/(m^2).  Wt Readings from Last 3 Encounters:  03/23/15 151 lb 12.8 oz (68.856 kg)  11/27/14 142 lb (64.411 kg)  10/24/14 143 lb (64.864 kg)    Elderly woman, no distress. HEENT: Conjunctiva and lids normal, oropharynx clear. Neck: Supple, no elevated JVP or carotid bruits, no thyromegaly. Lungs: Decreased breath sounds but clear, nonlabored breathing at rest. Cardiac: Regular rate and rhythm, no S3, soft systolic murmur, no pericardial rub. Abdomen: Soft, nontender, bowel sounds present, no guarding or rebound. Extremities: Trace ankle edema, distal  pulses 2+.   ECG: ECG is not ordered today.   Recent Labwork: 05/05/2014: Hemoglobin 14.4; Platelets 194 10/18/2014: ALT 11; AST 13; BUN 24; Creatinine, Ser 0.68; Magnesium 2.0; Potassium 4.5; Sodium 144; TSH 2.250   Other Studies Reviewed Today:  Echocardiogram 05/15/2014: Study Conclusions  - Left ventricle: The cavity size was normal. Wall thickness was normal. Systolic function was normal. The estimated ejection fraction was in the range of 60% to 65%. - Aortic valve: There was mild regurgitation. - Pulmonary arteries: PA peak pressure: 33 mm Hg (S).  Assessment and Plan:  1. Palpitations well controlled on current regimen, possible history of PSVT although not well documented. We plan to continue observation for now.  2. Hyperlipidemia, on Zocor, followed by Dr. Laurance Flatten.  Current medicines were reviewed with the patient today.  Disposition: FU with me in 6 months.   Signed, Satira Sark, MD, [facility] 03/23/2015 2:33 PM    [facility], [place], [place] [address] Phone: [phone]; Fax: [phone]

## 2015-03-23 NOTE — Patient Instructions (Signed)
Your physician wants you to follow-up in: 6 months with Dr.McDowell You will receive a reminder letter in the mail two months in advance. If you don't receive a letter, please call our office to schedule the follow-up appointment.     Your physician recommends that you continue on your current medications as directed. Please refer to the Current Medication list given to you today.     Thank you for choosing [facility] !        

## 2015-04-02 IMAGING — CR DG THORACIC SPINE 2V
2 series · 2 of 2 positions shown · non-contrast
Comparison: Chest x-ray 05/05/2014, 01/20/2014. Lumbar spine series
03/13/2014, lumbar spine MRI 03/29/2014 MRI 03/29/2014.

CLINICAL DATA: Multiple falls. Back pain. Gait instability. Initial
evaluation .

EXAM:
THORACIC SPINE - 2 VIEW

[view not recorded (1 of 2)]
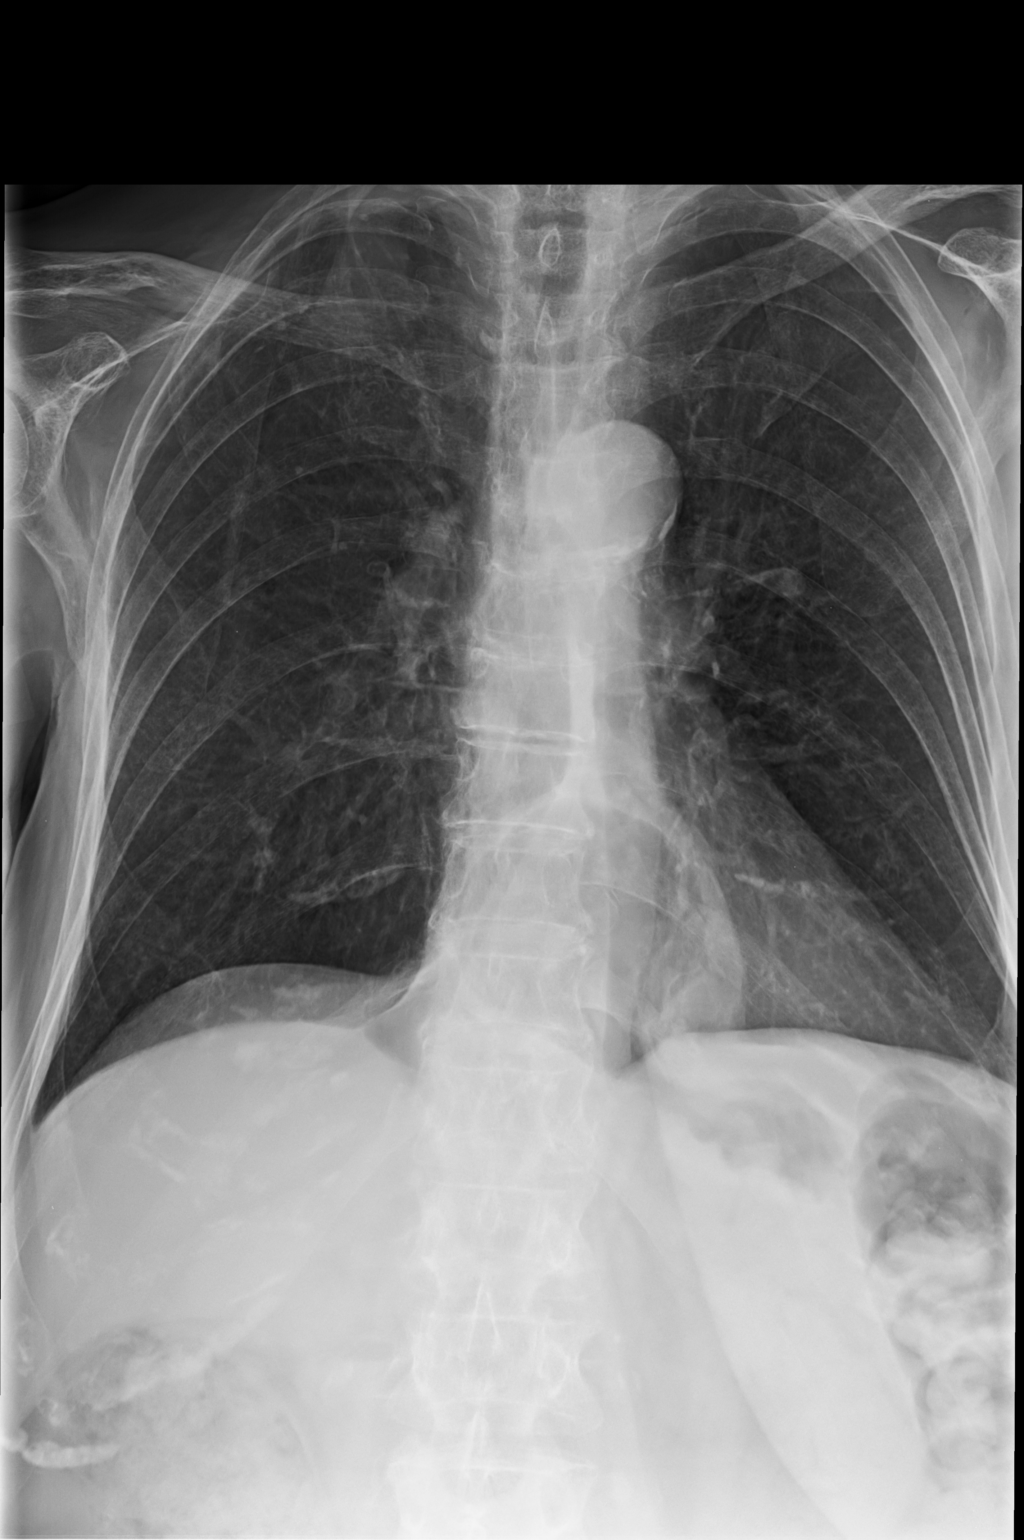

[view not recorded (2 of 2)]
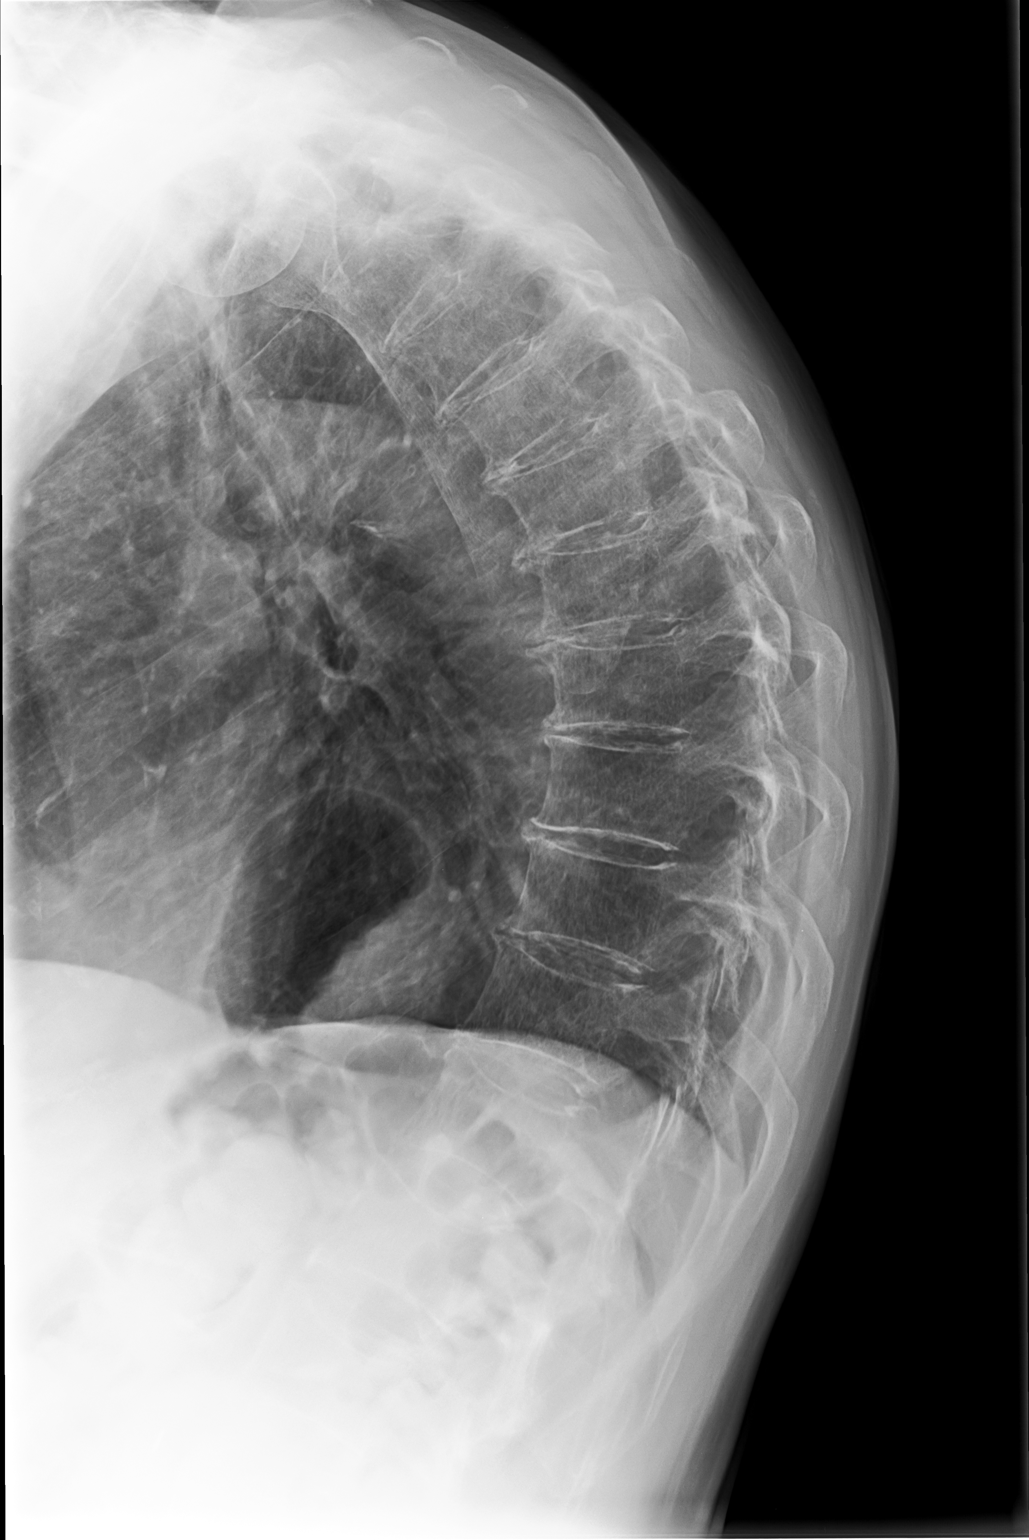

[2 of 2 positions shown; findings below may reference images not displayed]

FINDINGS: Paraspinal soft tissues are normal. Prominent sliding hiatal hernia.
Diffuse osteopenia and degenerative change. No acute fracture noted.
Lower thoracic minimal stable compression fractures are present . No
change from 03/13/2014.
IMPRESSION: Diffuse osteopenia and degenerative change. Stable minimal lower
thoracic vertebral body compression fractures. No acute compression
fracture.

## 2015-04-05 ENCOUNTER — Other Ambulatory Visit: Payer: Self-pay | Admitting: Nurse Practitioner

## 2015-04-05 ENCOUNTER — Other Ambulatory Visit: Payer: Self-pay | Admitting: Family Medicine

## 2015-04-05 NOTE — Telephone Encounter (Signed)
Last seen 11/27/14  DWM  If approved route to nurse to call into [facility]

## 2015-04-05 NOTE — Telephone Encounter (Signed)
rx called to pharmacy 

## 2015-04-05 NOTE — Telephone Encounter (Signed)
Please call in diazepam with 0 refills 

## 2015-04-06 ENCOUNTER — Ambulatory Visit (INDEPENDENT_AMBULATORY_CARE_PROVIDER_SITE_OTHER): Payer: Medicare Other | Admitting: Family Medicine

## 2015-04-06 ENCOUNTER — Encounter: Payer: Self-pay | Admitting: Family Medicine

## 2015-04-06 VITALS — BP 135/74 | HR 79 | Temp 97.3°F | Ht 65.0 in | Wt 146.0 lb

## 2015-04-06 DIAGNOSIS — I10 Essential (primary) hypertension: Secondary | ICD-10-CM

## 2015-04-06 DIAGNOSIS — I471 Supraventricular tachycardia: Secondary | ICD-10-CM

## 2015-04-06 DIAGNOSIS — I700 Atherosclerosis of aorta: Secondary | ICD-10-CM | POA: Diagnosis not present

## 2015-04-06 DIAGNOSIS — E559 Vitamin D deficiency, unspecified: Secondary | ICD-10-CM | POA: Diagnosis not present

## 2015-04-06 DIAGNOSIS — S22009S Unspecified fracture of unspecified thoracic vertebra, sequela: Secondary | ICD-10-CM

## 2015-04-06 DIAGNOSIS — M419 Scoliosis, unspecified: Secondary | ICD-10-CM | POA: Diagnosis not present

## 2015-04-06 DIAGNOSIS — D509 Iron deficiency anemia, unspecified: Secondary | ICD-10-CM

## 2015-04-06 DIAGNOSIS — E785 Hyperlipidemia, unspecified: Secondary | ICD-10-CM | POA: Diagnosis not present

## 2015-04-06 DIAGNOSIS — C50911 Malignant neoplasm of unspecified site of right female breast: Secondary | ICD-10-CM

## 2015-04-06 DIAGNOSIS — R1012 Left upper quadrant pain: Secondary | ICD-10-CM

## 2015-04-06 NOTE — Progress Notes (Signed)
Subjective:    Patient ID: Krystal [name], female    DOB: Apr 10, 1941, 74 y.o.   MRN: [id number]  HPI Pt here for follow up and management of chronic medical problems which includes hyperlipidemia and hypertension. She is taking medications regularly. 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 This requires a lot of lifting and pushing pulling. She does complain of some myalgias and discomfort on her left side. She is due to receive an FOBT but has one at home which she has not returned. She is also due to get lab work today. She was made aware of the flu vaccine. The patient has a history of a fall over year ago and continues to have problems in her left upper quadrant area and left side area. She is tender in the left upper quadrant when she presses on this. This pain is especially noticeable with movement. She denies chest pain or shortness of breath otherwise. She has no GI symptoms to complain with. There is no blood in the stool that she is aware of. She is passing her water without problems. The patient has had a malignant neoplasm of the breast. She gets her mammograms yearly. She also has had piece SVT or paroxysmal supraventricular tachycardia is currently being followed by the cardiologist. This according to her is doing much better. The rate is much better and she feels better.      Patient Active Problem List   Diagnosis Date Noted  . PSVT (paroxysmal supraventricular tachycardia) 05/10/2014  . Macular degeneration, age related, nonexudative 08/24/2013  . History of right mastectomy 07/13/2013  . Anemia, iron deficiency 06/21/2013  . Allergic rhinitis 06/21/2013  . Chronic low back pain 06/21/2013  . NEOPLASM, MALIGNANT, BREAST 11/22/2007  . Mixed hyperlipidemia 11/22/2007  . ANXIETY 11/22/2007  . DEPRESSION 11/22/2007  . ASTHMA, UNSPECIFIED, UNSPECIFIED STATUS 11/22/2007  . GERD 11/22/2007  . GASTRITIS, WITH  HEMORRHAGE 08/14/2007  . DUODENITIS, WITHOUT HEMORRHAGE 08/14/2007  . HIATAL HERNIA 08/14/2007  . 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   Outpatient Encounter Prescriptions as of 04/06/2015  Medication Sig  . albuterol (PROAIR HFA) 108 (90 BASE) MCG/ACT inhaler Inhale 1 puff into the lungs 4 (four) times daily as needed for wheezing or shortness of breath.  Marland Kitchen aspirin EC 81 MG tablet Take 81 mg by mouth at bedtime.  . cetirizine (ZYRTEC) 10 MG tablet Take 10 mg by mouth at bedtime.   . Cholecalciferol (VITAMIN D) 2000 UNITS CAPS Take 1 capsule by mouth at bedtime.  . diazepam (VALIUM) 5 MG tablet TAKE ONE TABLET BY MOUTH EVERY SIX HOURS AS NEEDED  . diltiazem (CARDIZEM CD) 240 MG 24 hr capsule Take 1 capsule (240 mg total) by mouth daily.  Marland Kitchen esomeprazole (NEXIUM) 40 MG capsule Take 1 capsule (40 mg total) by mouth daily.  . ferrous sulfate 325 (65 FE) MG tablet Take 325 mg by mouth at bedtime.  . furosemide (LASIX) 20 MG tablet TAKE ONE TABLET BY MOUTH ONE TIME DAILY  . gabapentin (NEURONTIN) 100 MG capsule TAKE ONE CAPSULE BY MOUTH AT BEDTIME  . lidocaine (LIDODERM) 5 % Place 1 patch onto the skin daily. Remove & Discard patch within 12 hours or as directed by MD  . montelukast (SINGULAIR) 10 MG tablet Take 1 tablet (10 mg total) by mouth at bedtime.  . Multiple Vitamins-Minerals (PRESERVISION/LUTEIN PO) Take 1 capsule by mouth at bedtime.   . nitroGLYCERIN (NITROLINGUAL) 0.4 MG/SPRAY spray  Use one spray under tongue as needed for chest pain. May repeat dose in 5 minutes. If chest pain not relieved than call 911.  . simvastatin (ZOCOR) 10 MG tablet TAKE 0.5 TABLETS (5 MG TOTAL) BY MOUTH DAILY.  . valACYclovir (VALTREX) 500 MG tablet Take 1 tablet (500 mg total) by mouth 3 (three) times daily.   No facility-administered encounter medications on file as of 04/06/2015.     Review of Systems  Constitutional: Negative.   HENT: Negative.   Eyes: Negative.   Respiratory: Negative.     Cardiovascular: Negative.   Gastrointestinal: Negative.   Endocrine: Negative.   Genitourinary: Negative.   Musculoskeletal: Positive for myalgias (left side pains / soreness).  Skin: Negative.   Allergic/Immunologic: Negative.   Neurological: Negative.   Hematological: Negative.   Psychiatric/Behavioral: Negative.        Objective:   Physical Exam  Constitutional: She is oriented to person, place, and time. No distress.  The patient is alert and somewhat elderly-appearing because of her kyphoscoliosis posture.  HENT:  Head: Normocephalic and atraumatic.  Left Ear: External ear normal.  Nose: Nose normal.  Mouth/Throat: Oropharynx is clear and moist.  Ear cerumen right greater than left  Eyes: Conjunctivae and EOM are normal. Pupils are equal, round, and reactive to light. Right eye exhibits no discharge. Left eye exhibits no discharge. No scleral icterus.  Neck: Normal range of motion. Neck supple. No thyromegaly present.  Neck without bruits or thyromegaly  Cardiovascular: Normal rate, regular rhythm, normal heart sounds and intact distal pulses.   No murmur heard. Heart has a regular rate and rhythm at 84/m  Pulmonary/Chest: Effort normal and breath sounds normal. No respiratory distress. She has no wheezes. She has no rales. She exhibits no tenderness.  Clear anteriorly and posteriorly  Abdominal: Soft. Bowel sounds are normal. She exhibits no mass. There is no tenderness. There is no rebound and no guarding.  Tender left upper quadrant without masses or organ enlargement. No bruits.  Musculoskeletal: Normal range of motion. She exhibits no edema.  The patient has a kyphosis  Lymphadenopathy:    She has no cervical adenopathy.  Neurological: She is alert and oriented to person, place, and time. She has normal reflexes. No cranial nerve deficit.  Skin: Skin is warm and dry. No rash noted.  Psychiatric: She has a normal mood and affect. Her behavior is normal. Judgment and  thought content normal.  Nursing note and vitals reviewed.  BP 135/74 mmHg  Pulse 79  Temp(Src) 97.3 F (36.3 C) (Oral)  Ht _0  (1.651 m)  Wt 146 lb (66.225 kg)  BMI 24.30 kg/m2        Assessment & Plan:  1. Hyperlipidemia -The patient should continue with current treatment and as aggressive therapeutic lifestyle changes as possible - CBC with Differential/Platelet - NMR, lipoprofile  2. Essential hypertension -The blood pressure is good today and she should continue with current treatment - BMP8+EGFR - Hepatic function panel - CBC with Differential/Platelet  3. Vitamin D deficiency -She should continue with vitamin D3, 2000 units 1 daily pending results of lab work - CBC with Differential/Platelet - Vit D  25 hydroxy (rtn osteoporosis monitoring)  4. Anemia, iron deficiency -She should continue with her current iron treatment of 325 mg daily pending results of CBC - CBC with Differential/Platelet  5. Paroxysmal supraventricular tachycardia -She should continue with her regular follow-up with cardiology and treatment he has prescribed  6. Breast cancer, right -She should continue  monitoring this with breast mammograms regularly  7. Kyphoscoliosis -Try to avoid lifting pushing and pulling as much as possible  8. Thoracic vertebral fracture, sequela -Try to avoid lifting pushing and pulling as much as possible as this most likely explaining the biggest role with her abdominal pain  9. Thoracic aortic atherosclerosis -Continue with aggressive therapeutic lifestyle changes as far as diet and cholesterol medication  Patient Instructions                       Medicare Annual Wellness Visit  [facility] and the medical providers at [facility] strive to bring you the best medical care.  In doing so we not only want to address your current medical conditions and concerns but also to detect new conditions early and prevent illness, disease and  health-related problems.    Medicare offers a yearly Wellness Visit which allows our clinical staff to assess your need for preventative services including immunizations, lifestyle education, counseling to decrease risk of preventable diseases and screening for fall risk and other medical concerns.    This visit is provided free of charge (no copay) for all Medicare recipients. The clinical pharmacists at [facility] have begun to conduct these Wellness Visits which will also include a thorough review of all your medications.    As you primary medical provider recommend that you make an appointment for your Annual Wellness Visit if you have not done so already this year.  You may set up this appointment before you leave today or you may call back ([phone]) and schedule an appointment.  Please make sure when you call that you mention that you are scheduling your Annual Wellness Visit with the clinical pharmacist so that the appointment may be made for the proper length of time.     Continue current medications. Continue good therapeutic lifestyle changes which include good diet and exercise. Fall precautions discussed with patient. If an FOBT was given today- please return it to our front desk. If you are over 44 years old - you may need Prevnar 47 or the adult Pneumonia vaccine.  **Flu shots will be available soon--- please call and schedule a FLU-CLINIC appointment**  After your visit with [place] today you will receive a survey in the mail or online from [employer] regarding your care with [place]. Please take a moment to fill this out. Your feedback is very important to [place] as you can help [place] better understand your patient needs as well as improve your experience and satisfaction. WE CARE ABOUT YOU!!!   **Please join [place] SEPT.[email or web address], 2016 from 5:00 to 7:00pm for our OPEN HOUSE! Come out and meet our NEW providers**   continue to follow-up regularly with the cardiologist  We will  arrange for you to have x-rays of your thoracic or lumbar spine and a scan of your abdomen.  return the FOBT  Try to avoid is much lifting at home as possible  Stay up to date on mammograms   Arrie Senate MD

## 2015-04-06 NOTE — Patient Instructions (Addendum)
Medicare Annual Wellness Visit  [facility] and the medical providers at [facility] strive to bring you the best medical care.  In doing so we not only want to address your current medical conditions and concerns but also to detect new conditions early and prevent illness, disease and health-related problems.    Medicare offers a yearly Wellness Visit which allows our clinical staff to assess your need for preventative services including immunizations, lifestyle education, counseling to decrease risk of preventable diseases and screening for fall risk and other medical concerns.    This visit is provided free of charge (no copay) for all Medicare recipients. The clinical pharmacists at [facility] have begun to conduct these Wellness Visits which will also include a thorough review of all your medications.    As you primary medical provider recommend that you make an appointment for your Annual Wellness Visit if you have not done so already this year.  You may set up this appointment before you leave today or you may call back ([phone]) and schedule an appointment.  Please make sure when you call that you mention that you are scheduling your Annual Wellness Visit with the clinical pharmacist so that the appointment may be made for the proper length of time.     Continue current medications. Continue good therapeutic lifestyle changes which include good diet and exercise. Fall precautions discussed with patient. If an FOBT was given today- please return it to our front desk. If you are over 5 years old - you may need Prevnar 62 or the adult Pneumonia vaccine.  **Flu shots will be available soon--- please call and schedule a FLU-CLINIC appointment**  After your visit with [place] today you will receive a survey in the mail or online from [employer] regarding your care with [place]. Please take a moment to fill this out. Your feedback is  very important to [place] as you can help [place] better understand your patient needs as well as improve your experience and satisfaction. WE CARE ABOUT YOU!!!   **Please join [place] [email or web address], 2016 from 5:00 to 7:00pm for our OPEN HOUSE! Come out and meet our NEW providers**   continue to follow-up regularly with the cardiologist  We will arrange for you to have x-rays of your thoracic or lumbar spine and a scan of your abdomen.  return the FOBT  Try to avoid is much lifting at home as possible  Stay up to date on mammograms

## 2015-04-07 LAB — HEPATIC FUNCTION PANEL
ALK PHOS: 51 IU/L (ref 39–117)
ALT: 15 IU/L (ref 0–32)
AST: 15 IU/L (ref 0–40)
Albumin: 4.2 g/dL (ref 3.5–4.8)
Bilirubin Total: 0.5 mg/dL (ref 0.0–1.2)
Bilirubin, Direct: 0.13 mg/dL (ref 0.00–0.40)
TOTAL PROTEIN: 6.5 g/dL (ref 6.0–8.5)

## 2015-04-07 LAB — BMP8+EGFR
BUN / CREAT RATIO: 19 (ref 11–26)
BUN: 15 mg/dL (ref 8–27)
CHLORIDE: 100 mmol/L (ref 97–108)
CO2: 27 mmol/L (ref 18–29)
Calcium: 9.8 mg/dL (ref 8.7–10.3)
Creatinine, Ser: 0.78 mg/dL (ref 0.57–1.00)
GFR calc Af Amer: 86 mL/min/{1.73_m2} (ref >59)
GFR calc non Af Amer: 75 mL/min/{1.73_m2} (ref >59)
GLUCOSE: 97 mg/dL (ref 65–99)
POTASSIUM: 4.8 mmol/L (ref 3.5–5.2)
SODIUM: 140 mmol/L (ref 134–144)

## 2015-04-07 LAB — NMR, LIPOPROFILE
Cholesterol: 158 mg/dL (ref 100–199)
HDL CHOLESTEROL BY NMR: 72 mg/dL (ref >39)
HDL PARTICLE NUMBER: 37.6 umol/L (ref >=30.5)
LDL Particle Number: 619 nmol/L (ref <1000)
LDL Size: 21.5 nm (ref >20.5)
LDL-C: 68 mg/dL (ref 0–99)
LP-IR Score: <25 (ref <=45)
SMALL LDL PARTICLE NUMBER: 93 nmol/L (ref <=527)
TRIGLYCERIDES BY NMR: 88 mg/dL (ref 0–149)

## 2015-04-07 LAB — CBC WITH DIFFERENTIAL/PLATELET
BASOS ABS: 0 10*3/uL (ref 0.0–0.2)
Basos: 1 %
EOS (ABSOLUTE): 0.2 10*3/uL (ref 0.0–0.4)
EOS: 4 %
HEMATOCRIT: 41.2 % (ref 34.0–46.6)
HEMOGLOBIN: 13.9 g/dL (ref 11.1–15.9)
IMMATURE GRANS (ABS): 0 10*3/uL (ref 0.0–0.1)
Immature Granulocytes: 0 %
LYMPHS ABS: 1.7 10*3/uL (ref 0.7–3.1)
LYMPHS: 33 %
MCH: 29.9 pg (ref 26.6–33.0)
MCHC: 33.7 g/dL (ref 31.5–35.7)
MCV: 89 fL (ref 79–97)
MONOCYTES: 8 %
Monocytes Absolute: 0.4 10*3/uL (ref 0.1–0.9)
Neutrophils Absolute: 2.8 10*3/uL (ref 1.4–7.0)
Neutrophils: 54 %
Platelets: 181 10*3/uL (ref 150–379)
RBC: 4.65 x10E6/uL (ref 3.77–5.28)
RDW: 12.4 % (ref 12.3–15.4)
WBC: 5.1 10*3/uL (ref 3.4–10.8)

## 2015-04-07 LAB — VITAMIN D 25 HYDROXY (VIT D DEFICIENCY, FRACTURES): Vit D, 25-Hydroxy: 53.1 ng/mL (ref 30.0–100.0)

## 2015-04-10 ENCOUNTER — Ambulatory Visit (HOSPITAL_COMMUNITY)
Admission: RE | Admit: 2015-04-10 | Discharge: 2015-04-10 | Disposition: A | Discharge status: Home / Self Care | Payer: Medicare Other | Source: Ambulatory Visit | Attending: Family Medicine | Admitting: Family Medicine

## 2015-04-10 ENCOUNTER — Encounter: Payer: Self-pay | Admitting: Family Medicine

## 2015-04-10 DIAGNOSIS — R1012 Left upper quadrant pain: Secondary | ICD-10-CM

## 2015-04-10 DIAGNOSIS — K449 Diaphragmatic hernia without obstruction or gangrene: Secondary | ICD-10-CM | POA: Insufficient documentation

## 2015-04-10 DIAGNOSIS — I700 Atherosclerosis of aorta: Secondary | ICD-10-CM | POA: Insufficient documentation

## 2015-04-10 DIAGNOSIS — S22009S Unspecified fracture of unspecified thoracic vertebra, sequela: Secondary | ICD-10-CM

## 2015-04-17 DIAGNOSIS — H3532 Exudative age-related macular degeneration: Secondary | ICD-10-CM | POA: Diagnosis not present

## 2015-04-17 DIAGNOSIS — H3531 Nonexudative age-related macular degeneration: Secondary | ICD-10-CM | POA: Diagnosis not present

## 2015-04-17 DIAGNOSIS — H26492 Other secondary cataract, left eye: Secondary | ICD-10-CM | POA: Diagnosis not present

## 2015-05-04 ENCOUNTER — Other Ambulatory Visit: Payer: Self-pay | Admitting: Family Medicine

## 2015-05-10 ENCOUNTER — Other Ambulatory Visit: Payer: Self-pay | Admitting: Nurse Practitioner

## 2015-05-10 NOTE — Telephone Encounter (Signed)
[facility] pt, last filled 04/05/15, last seen 04/06/15. Call in at [facility]

## 2015-05-14 DIAGNOSIS — H26492 Other secondary cataract, left eye: Secondary | ICD-10-CM | POA: Diagnosis not present

## 2015-05-25 ENCOUNTER — Telehealth: Payer: Self-pay | Admitting: Family Medicine

## 2015-05-25 NOTE — Telephone Encounter (Signed)
She does not want a flu shot

## 2015-05-29 ENCOUNTER — Telehealth: Payer: Self-pay | Admitting: Family Medicine

## 2015-05-30 MED ORDER — AZITHROMYCIN 250 MG PO TABS: ORAL_TABLET | ORAL | Status: DC

## 2015-05-30 NOTE — Telephone Encounter (Signed)
Please call a prescription in for a Z-Pak and have the patient take Mucinex twice a day with a large glass of water. That is plain Mucinex, blue and white in color,

## 2015-06-07 ENCOUNTER — Other Ambulatory Visit: Payer: Self-pay

## 2015-06-07 MED ORDER — SIMVASTATIN 10 MG PO TABS: ORAL_TABLET | ORAL | Status: DC

## 2015-06-07 NOTE — Telephone Encounter (Signed)
Patient said she is now taking 1 a day  DWM

## 2015-06-12 DIAGNOSIS — H353211 Exudative age-related macular degeneration, right eye, with active choroidal neovascularization: Secondary | ICD-10-CM | POA: Diagnosis not present

## 2015-06-12 DIAGNOSIS — H43811 Vitreous degeneration, right eye: Secondary | ICD-10-CM | POA: Diagnosis not present

## 2015-06-12 DIAGNOSIS — H353124 Nonexudative age-related macular degeneration, left eye, advanced atrophic with subfoveal involvement: Secondary | ICD-10-CM | POA: Diagnosis not present

## 2015-07-12 ENCOUNTER — Other Ambulatory Visit: Payer: Self-pay | Admitting: Family Medicine

## 2015-07-12 NOTE — Telephone Encounter (Signed)
Last seen 04/06/15  DWM If approved route to nurse to call into [facility] 

## 2015-07-14 ENCOUNTER — Other Ambulatory Visit: Payer: Self-pay | Admitting: Family Medicine

## 2015-08-09 ENCOUNTER — Telehealth: Payer: Self-pay | Admitting: Family Medicine

## 2015-08-09 MED ORDER — AZITHROMYCIN 250 MG PO TABS: ORAL_TABLET | ORAL | Status: DC

## 2015-08-09 NOTE — Telephone Encounter (Signed)
Please call in a Z-Pak for this patient and make sure that she takes Mucinex one twice daily for cough and congestion if she is not sensitive to that--- if she is sensitive to Mucinex she can take Tessalon Perles 1 twice daily

## 2015-08-20 ENCOUNTER — Other Ambulatory Visit: Payer: Self-pay | Admitting: Cardiology

## 2015-08-22 ENCOUNTER — Other Ambulatory Visit: Payer: Self-pay | Admitting: Family Medicine

## 2015-08-24 ENCOUNTER — Telehealth: Payer: Self-pay | Admitting: Family Medicine

## 2015-08-24 MED ORDER — AZITHROMYCIN 250 MG PO TABS: ORAL_TABLET | ORAL | Status: DC

## 2015-08-24 NOTE — Telephone Encounter (Signed)
Left detailed message that rx was sent to [facility] in [place] and that if she gets sick again she would ntbs and to CB with any further questions or concerns.

## 2015-08-24 NOTE — Telephone Encounter (Signed)
A Z-Pak may be called in but if she gets sick again she will need to come in and be seen as I think this is the second time we will call a prescription in.

## 2015-09-03 ENCOUNTER — Ambulatory Visit: Payer: Medicare Other | Admitting: Family Medicine

## 2015-09-19 ENCOUNTER — Ambulatory Visit: Payer: Self-pay | Admitting: Cardiology

## 2015-10-29 ENCOUNTER — Emergency Department (HOSPITAL_COMMUNITY): Payer: Medicare Other

## 2015-10-29 ENCOUNTER — Encounter (HOSPITAL_COMMUNITY): Payer: Self-pay | Admitting: Emergency Medicine

## 2015-10-29 ENCOUNTER — Inpatient Hospital Stay (HOSPITAL_COMMUNITY)
Admission: EM | Admit: 2015-10-29 | Discharge: 2015-11-03 | DRG: 190 | Disposition: A | Discharge status: Home / Self Care | Payer: Medicare Other | Attending: Internal Medicine | Admitting: Internal Medicine

## 2015-10-29 DIAGNOSIS — R05 Cough: Secondary | ICD-10-CM | POA: Diagnosis not present

## 2015-10-29 DIAGNOSIS — J09X1 Influenza due to identified novel influenza A virus with pneumonia: Secondary | ICD-10-CM | POA: Diagnosis not present

## 2015-10-29 DIAGNOSIS — F411 Generalized anxiety disorder: Secondary | ICD-10-CM | POA: Diagnosis not present

## 2015-10-29 DIAGNOSIS — E785 Hyperlipidemia, unspecified: Secondary | ICD-10-CM | POA: Diagnosis not present

## 2015-10-29 DIAGNOSIS — H3530 Unspecified macular degeneration: Secondary | ICD-10-CM | POA: Diagnosis present

## 2015-10-29 DIAGNOSIS — Z823 Family history of stroke: Secondary | ICD-10-CM

## 2015-10-29 DIAGNOSIS — J45909 Unspecified asthma, uncomplicated: Secondary | ICD-10-CM | POA: Diagnosis present

## 2015-10-29 DIAGNOSIS — Z87891 Personal history of nicotine dependence: Secondary | ICD-10-CM

## 2015-10-29 DIAGNOSIS — J189 Pneumonia, unspecified organism: Secondary | ICD-10-CM | POA: Diagnosis present

## 2015-10-29 DIAGNOSIS — Z853 Personal history of malignant neoplasm of breast: Secondary | ICD-10-CM

## 2015-10-29 DIAGNOSIS — Z833 Family history of diabetes mellitus: Secondary | ICD-10-CM

## 2015-10-29 DIAGNOSIS — Z86010 Personal history of colonic polyps: Secondary | ICD-10-CM

## 2015-10-29 DIAGNOSIS — I471 Supraventricular tachycardia, unspecified: Secondary | ICD-10-CM | POA: Diagnosis present

## 2015-10-29 DIAGNOSIS — Z809 Family history of malignant neoplasm, unspecified: Secondary | ICD-10-CM

## 2015-10-29 DIAGNOSIS — D61818 Other pancytopenia: Secondary | ICD-10-CM | POA: Diagnosis present

## 2015-10-29 DIAGNOSIS — J440 Chronic obstructive pulmonary disease with acute lower respiratory infection: Secondary | ICD-10-CM | POA: Diagnosis not present

## 2015-10-29 DIAGNOSIS — Z8249 Family history of ischemic heart disease and other diseases of the circulatory system: Secondary | ICD-10-CM

## 2015-10-29 DIAGNOSIS — J9600 Acute respiratory failure, unspecified whether with hypoxia or hypercapnia: Secondary | ICD-10-CM | POA: Diagnosis not present

## 2015-10-29 DIAGNOSIS — J1100 Influenza due to unidentified influenza virus with unspecified type of pneumonia: Secondary | ICD-10-CM

## 2015-10-29 DIAGNOSIS — R509 Fever, unspecified: Secondary | ICD-10-CM | POA: Diagnosis not present

## 2015-10-29 DIAGNOSIS — I739 Peripheral vascular disease, unspecified: Secondary | ICD-10-CM | POA: Diagnosis present

## 2015-10-29 DIAGNOSIS — Z9011 Acquired absence of right breast and nipple: Secondary | ICD-10-CM

## 2015-10-29 DIAGNOSIS — Z85828 Personal history of other malignant neoplasm of skin: Secondary | ICD-10-CM

## 2015-10-29 LAB — COMPREHENSIVE METABOLIC PANEL
ALBUMIN: 3.7 g/dL (ref 3.5–5.0)
ALT: 22 U/L (ref 14–54)
AST: 31 U/L (ref 15–41)
Alkaline Phosphatase: 45 U/L (ref 38–126)
Anion gap: 7 (ref 5–15)
BUN: 20 mg/dL (ref 6–20)
CHLORIDE: 105 mmol/L (ref 101–111)
CO2: 24 mmol/L (ref 22–32)
CREATININE: 0.75 mg/dL (ref 0.44–1.00)
Calcium: 8.5 mg/dL — ABNORMAL LOW (ref 8.9–10.3)
GFR calc Af Amer: >60 mL/min (ref >60)
GFR calc non Af Amer: >60 mL/min (ref >60)
Glucose, Bld: 108 mg/dL — ABNORMAL HIGH (ref 65–99)
Potassium: 4 mmol/L (ref 3.5–5.1)
SODIUM: 136 mmol/L (ref 135–145)
Total Bilirubin: 0.5 mg/dL (ref 0.3–1.2)
Total Protein: 6 g/dL — ABNORMAL LOW (ref 6.5–8.1)

## 2015-10-29 LAB — URINALYSIS, ROUTINE W REFLEX MICROSCOPIC
Bilirubin Urine: NEGATIVE
GLUCOSE, UA: NEGATIVE mg/dL
Hgb urine dipstick: NEGATIVE
Ketones, ur: NEGATIVE mg/dL
LEUKOCYTES UA: NEGATIVE
NITRITE: NEGATIVE
PH: 7 (ref 5.0–8.0)
PROTEIN: NEGATIVE mg/dL
Specific Gravity, Urine: 1.015 (ref 1.005–1.030)

## 2015-10-29 LAB — CBC WITH DIFFERENTIAL/PLATELET
Basophils Absolute: 0 10*3/uL (ref 0.0–0.1)
Basophils Relative: 0 %
Eosinophils Absolute: 0 10*3/uL (ref 0.0–0.7)
Eosinophils Relative: 1 %
HCT: 36.4 % (ref 36.0–46.0)
HEMOGLOBIN: 11.7 g/dL — AB (ref 12.0–15.0)
LYMPHS ABS: 0.4 10*3/uL — AB (ref 0.7–4.0)
LYMPHS PCT: 9 %
MCH: 30.2 pg (ref 26.0–34.0)
MCHC: 32.1 g/dL (ref 30.0–36.0)
MCV: 94.1 fL (ref 78.0–100.0)
MONO ABS: 0.4 10*3/uL (ref 0.1–1.0)
MONOS PCT: 8 %
NEUTROS ABS: 3.9 10*3/uL (ref 1.7–7.7)
NEUTROS PCT: 82 %
PLATELETS: 124 10*3/uL — AB (ref 150–400)
RBC: 3.87 MIL/uL (ref 3.87–5.11)
RDW: 13 % (ref 11.5–15.5)
WBC: 4.7 10*3/uL (ref 4.0–10.5)

## 2015-10-29 LAB — I-STAT CG4 LACTIC ACID, ED
Lactic Acid, Venous: 0.76 mmol/L (ref 0.5–2.0)
Lactic Acid, Venous: 0.57 mmol/L (ref 0.5–2.0)

## 2015-10-29 LAB — RAPID STREP SCREEN (MED CTR MEBANE ONLY): STREPTOCOCCUS, GROUP A SCREEN (DIRECT): NEGATIVE

## 2015-10-29 LAB — LACTIC ACID, PLASMA
Lactic Acid, Venous: 0.7 mmol/L (ref 0.5–2.0)
Lactic Acid, Venous: 0.7 mmol/L (ref 0.5–2.0)

## 2015-10-29 LAB — INFLUENZA PANEL BY PCR (TYPE A & B)
H1N1 flu by pcr: NOT DETECTED
INFLAPCR: POSITIVE — AB
INFLBPCR: NEGATIVE

## 2015-10-29 MED ORDER — ACETAMINOPHEN 325 MG PO TABS
650.0000 mg | ORAL_TABLET | Freq: Once | ORAL | Status: AC
  Administered 2015-10-29: 650 mg via ORAL
  Filled 2015-10-29: qty 2

## 2015-10-29 MED ORDER — ALBUTEROL SULFATE HFA 108 (90 BASE) MCG/ACT IN AERS
1.0000 | INHALATION_SPRAY | RESPIRATORY_TRACT | Status: DC | PRN
  Filled 2015-10-29: qty 6.7

## 2015-10-29 MED ORDER — DEXTROSE 5 % IV SOLN
500.0000 mg | INTRAVENOUS | Status: DC
  Administered 2015-10-29 – 2015-10-31 (×3): 500 mg via INTRAVENOUS
  Filled 2015-10-29 (×4): qty 500

## 2015-10-29 MED ORDER — DEXTROSE 5 % IV SOLN
1.0000 g | Freq: Once | INTRAVENOUS | Status: AC
  Administered 2015-10-29: 1 g via INTRAVENOUS
  Filled 2015-10-29: qty 10

## 2015-10-29 MED ORDER — SODIUM CHLORIDE 0.9 % IV SOLN: 250.0000 mL | INTRAVENOUS | Status: DC | PRN

## 2015-10-29 MED ORDER — ASPIRIN EC 81 MG PO TBEC
81.0000 mg | DELAYED_RELEASE_TABLET | Freq: Every day | ORAL | Status: DC
  Administered 2015-10-29 – 2015-11-02 (×5): 81 mg via ORAL
  Filled 2015-10-29 (×5): qty 1

## 2015-10-29 MED ORDER — DEXTROSE 5 % IV SOLN
1.0000 g | INTRAVENOUS | Status: DC
  Administered 2015-10-29 – 2015-11-02 (×5): 1 g via INTRAVENOUS
  Filled 2015-10-29 (×7): qty 10

## 2015-10-29 MED ORDER — FERROUS SULFATE 325 (65 FE) MG PO TABS
325.0000 mg | ORAL_TABLET | Freq: Every day | ORAL | Status: DC
  Administered 2015-10-29 – 2015-11-02 (×5): 325 mg via ORAL
  Filled 2015-10-29 (×7): qty 1

## 2015-10-29 MED ORDER — DILTIAZEM HCL ER COATED BEADS 240 MG PO CP24
240.0000 mg | ORAL_CAPSULE | Freq: Every day | ORAL | Status: DC
  Administered 2015-10-29 – 2015-11-03 (×6): 240 mg via ORAL
  Filled 2015-10-29 (×8): qty 1

## 2015-10-29 MED ORDER — SODIUM CHLORIDE 0.9% FLUSH: 3.0000 mL | INTRAVENOUS | Status: DC | PRN

## 2015-10-29 MED ORDER — VITAMIN D 1000 UNITS PO TABS
2000.0000 [IU] | ORAL_TABLET | Freq: Every day | ORAL | Status: DC
  Administered 2015-10-29 – 2015-11-02 (×5): 2000 [IU] via ORAL
  Filled 2015-10-29 (×7): qty 2

## 2015-10-29 MED ORDER — SODIUM CHLORIDE 0.9 % IV SOLN
INTRAVENOUS | Status: AC
Start: 2015-10-29 — End: 2015-10-30
  Administered 2015-10-29: 20:00:00 via INTRAVENOUS

## 2015-10-29 MED ORDER — DEXTROSE 5 % IV SOLN
500.0000 mg | Freq: Once | INTRAVENOUS | Status: AC
  Administered 2015-10-29: 500 mg via INTRAVENOUS
  Filled 2015-10-29: qty 500

## 2015-10-29 MED ORDER — SODIUM CHLORIDE 0.9% FLUSH
3.0000 mL | Freq: Two times a day (BID) | INTRAVENOUS | Status: DC
  Administered 2015-10-29 – 2015-11-03 (×7): 3 mL via INTRAVENOUS

## 2015-10-29 MED ORDER — SODIUM CHLORIDE 0.9 % IV BOLUS (SEPSIS)
1000.0000 mL | Freq: Once | INTRAVENOUS | Status: AC
  Administered 2015-10-29: 1000 mL via INTRAVENOUS

## 2015-10-29 MED ORDER — DIAZEPAM 5 MG PO TABS: 5.0000 mg | ORAL_TABLET | Freq: Four times a day (QID) | ORAL | Status: DC | PRN

## 2015-10-29 MED ORDER — DILTIAZEM HCL ER COATED BEADS 240 MG PO CP24
ORAL_CAPSULE | ORAL | Status: AC
  Filled 2015-10-29: qty 1

## 2015-10-29 NOTE — H&P (Signed)
PCP:   Redge Gainer, MD   Chief Complaint:  Not feeling well  HPI: 75 yo female with 3 days of cough, general malaise and fever.  Denies any n/v/d.  No chest pain or abdominal pain.  No le edema or swelling.  Has uri symptoms also last couple of days.  Pt has pleural effusion on cxr.  No recent abx or hospitalizations.  Referred for admission for pna.  o2 sats are normal at rest and with ambulation.  Review of Systems:  Positive and negative as per HPI otherwise all other systems are negative  Past Medical History: Past Medical History  Diagnosis Date  . COPD (chronic obstructive pulmonary disease) ([place])   . Hyperlipidemia   . Leg edema   . Asthma   . Right knee pain   . Breast cancer ([place])     Right breast  . Brain tumor ([place])     1986  . Colon polyps   . Hiatal hernia   . Cataract   . Macular degeneration   . PVD (posterior vitreous detachment)    Past Surgical History  Procedure Laterality Date  . Mastectomy Right   . Partial hysterectomy    . Craniectomy / craniotomy for excision of brain tumor      Left side brain  . Skin cancer excision      Under left eye  . Colonoscopy    . Polypectomy    . Abdominal hysterectomy    . Eye surgery      Medications: Prior to Admission medications   Medication Sig Start Date End Date Taking? Authorizing Provider  aspirin EC 81 MG tablet Take 81 mg by mouth at bedtime.   Yes Historical Provider, MD  cetirizine (ZYRTEC) 10 MG tablet Take 10 mg by mouth at bedtime.    Yes Historical Provider, MD  Cholecalciferol (VITAMIN D) 2000 UNITS CAPS Take 1 capsule by mouth at bedtime.   Yes Historical Provider, MD  diazepam (VALIUM) 5 MG tablet TAKE ONE TABLET BY MOUTH EVERY SIX HOURS AS NEEDED 07/13/15  Yes Chipper Herb, MD  diltiazem (CARDIZEM CD) 240 MG 24 hr capsule TAKE ONE CAPSULE BY MOUTH ONE TIME DAILY 08/21/15  Yes Satira Sark, MD  ferrous sulfate 325 (65 FE) MG tablet Take 325 mg by mouth at bedtime.   Yes Historical  Provider, MD  montelukast (SINGULAIR) 10 MG tablet TAKE 1 TABLET (10 MG TOTAL) BY MOUTH AT BEDTIME. 05/06/15  Yes Chipper Herb, MD  Multiple Vitamins-Minerals (PRESERVISION/LUTEIN PO) Take 1 capsule by mouth at bedtime.    Yes Historical Provider, MD  NEXIUM 40 MG capsule TAKE 1 CAPSULE (40 MG TOTAL)  BY MOUTH DAILY. 07/16/15  Yes Chipper Herb, MD  nitroGLYCERIN (NITROLINGUAL) 0.4 MG/SPRAY spray Use one spray under tongue as needed for chest pain. May repeat dose in 5 minutes. If chest pain not relieved than call 911. 10/18/14  Yes Chipper Herb, MD  PROAIR HFA 108 [phone] Base) MCG/ACT inhaler INHALE TWO PUFFS BY MOUTH 4 TIMES DAILY AS NEEDED 08/23/15  Yes Chipper Herb, MD  simvastatin (ZOCOR) 10 MG tablet ONE TABLET DAILY 07/12/15  Yes Chipper Herb, MD    Allergies:   Allergies  Allergen Reactions  . Actonel [Risedronate] Nausea And Vomiting and Other (See Comments)    Throat tightness  . Fosamax [Alendronate Sodium] Other (See Comments)    esophagitis  . Mevacor [Lovastatin] Other (See Comments)    myalgias  . Miacalcin [Calcitonin (  [place])] Nausea And Vomiting  . Mobic [Meloxicam] Hives and Itching    rash    Social History:  reports that she quit smoking about 54 years ago. Her smoking use included Cigarettes. She has never used smokeless tobacco. She reports that she does not drink alcohol or use illicit drugs.  Family History: Family History  Problem Relation Age of Onset  . Heart disease Mother 32    Stroke   . Dementia Mother   . Congestive Heart Failure Mother   . Heart disease Sister   . Hypertension Sister   . Diabetes Sister   . Heart disease Brother   . Hyperlipidemia Brother   . Diabetes Brother   . Heart disease Sister   . Hypertension Sister   . Diabetes Sister   . Diabetes Sister   . Hypertension Sister   . Diabetes Sister   . Hypertension Sister   . Diabetes Brother   . Hyperlipidemia Brother   . Colon cancer Neg Hx   . Rectal cancer Neg Hx   .  Stomach cancer Neg Hx   . Esophageal cancer Neg Hx   . Cancer Father     lung  . Heart attack Father   . Diabetes Brother   . Heart disease Brother   . Hyperlipidemia Brother   . Diabetes Brother     Physical Exam: Filed Vitals:   10/29/15 1733 10/29/15 1800 10/29/15 1832 10/29/15 1912  BP: 105/65 95/60 102/60 98/61  Pulse: 101 94  93  Temp:      TempSrc:      Resp: 16 19 25 19   Height:      Weight:      SpO2: 95% 97%  96%   General appearance: alert, cooperative and no distress Head: Normocephalic, without obvious abnormality, atraumatic Eyes: negative Nose: Nares normal. Septum midline. Mucosa normal. No drainage or sinus tenderness. Neck: no JVD and supple, symmetrical, trachea midline Lungs: clear to auscultation bilaterally Heart: regular rate and rhythm, S1, S2 normal, no murmur, click, rub or gallop Abdomen: soft, non-tender; bowel sounds normal; no masses,  no organomegaly Extremities: extremities normal, atraumatic, no cyanosis or edema Pulses: 2+ and symmetric Skin: Skin color, texture, turgor normal. No rashes or lesions Neurologic: Grossly normal    Labs on Admission:   Recent Labs  10/29/15 1515  NA 136  K 4.0  CL 105  CO2 24  GLUCOSE 108*  BUN 20  CREATININE 0.75  CALCIUM 8.5*    Recent Labs  10/29/15 1515  AST 31  ALT 22  ALKPHOS 45  BILITOT 0.5  PROT 6.0*  ALBUMIN 3.7    Recent Labs  10/29/15 1515  WBC 4.7  NEUTROABS 3.9  HGB 11.7*  HCT 36.4  MCV 94.1  PLT 124*    Radiological Exams on Admission: Dg Chest 2 View  10/29/2015  CLINICAL DATA:  75 year old female with cough, chest pain and fever for the past 3-4 days EXAM: CHEST  2 VIEW COMPARISON:  Prior chest x-ray 06/05/2014 FINDINGS: Cardiac and mediastinal contours appear similar. There is a large hiatal hernia. Interval development of a small left pleural effusion and associated left lower lobe opacity. Otherwise, similar appearance of a background bronchitic change and  interstitial prominence. No acute osseous abnormality. IMPRESSION: 1. Small left pleural effusion and associated left lower lobe opacity which is favored to reflect atelectasis. However, in the setting of cough and fever, early left lower lobe infiltrate is difficult to exclude. 2. Large hiatal hernia. 3.  Aortic atherosclerosis. 4. Background chronic bronchitic changes without significant interval change. Electronically Signed   By: Jacqulynn Cadet M.D.   On: 10/29/2015 15:36    Assessment/Plan  75 yo female with CAP  Principal Problem:   CAP (community acquired pneumonia)- pna pathway.  Rocephin and azithromycin.  Blood and sputum cx pending.  obs on medical.  Active Problems:   Anxiety state- noted   Asthma- noted, no wheezing in ED or on my evaluation.  No steroids at this time.   PSVT (paroxysmal supraventricular tachycardia) ([place])- noted.  nsr at this time  obs on medical.  Full code.  Likely d/c tomorrow.  Thomasena Vandenheuvel A 10/29/2015, 8:04 PM

## 2015-10-29 NOTE — ED Provider Notes (Signed)
CSN: [id number]     Arrival date & time 10/29/15  1417 History   First MD Initiated Contact with Patient 10/29/15 1638     Chief Complaint  Patient presents with  . Cough     (Consider location/radiation/quality/duration/timing/severity/associated sxs/prior Treatment) HPI Comments: Patient reports feeling unwell for the past several days. She has been taking care of her mother who has been recently moved to a nursing home. She endorses 2 day history of body aches, sore throat, runny nose, cough productive of yellow sputum. Febrile on arrival but no fever at home. No chest pain. Some shortness of breath with exertion. Denies any dizziness or lightheadedness. Good by mouth intake and urine output. No vomiting or diarrhea. She did not receive a flu shot. History of COPD, remote breast cancer, hiatal hernia, hyperlipidemia. She is a former smoker. Denies any abdominal pain, chest pain, back pain.    The history is provided by the patient.    Past Medical History  Diagnosis Date  . COPD (chronic obstructive pulmonary disease) ([place])   . Hyperlipidemia   . Leg edema   . Asthma   . Right knee pain   . Breast cancer ([place])     Right breast  . Brain tumor ([place])     1986  . Colon polyps   . Hiatal hernia   . Cataract   . Macular degeneration   . PVD (posterior vitreous detachment)    Past Surgical History  Procedure Laterality Date  . Mastectomy Right   . Partial hysterectomy    . Craniectomy / craniotomy for excision of brain tumor      Left side brain  . Skin cancer excision      Under left eye  . Colonoscopy    . Polypectomy    . Abdominal hysterectomy    . Eye surgery     Family History  Problem Relation Age of Onset  . Heart disease Mother 24    Stroke   . Dementia Mother   . Congestive Heart Failure Mother   . Heart disease Sister   . Hypertension Sister   . Diabetes Sister   . Heart disease Brother   . Hyperlipidemia Brother   . Diabetes Brother   . Heart disease  Sister   . Hypertension Sister   . Diabetes Sister   . Diabetes Sister   . Hypertension Sister   . Diabetes Sister   . Hypertension Sister   . Diabetes Brother   . Hyperlipidemia Brother   . Colon cancer Neg Hx   . Rectal cancer Neg Hx   . Stomach cancer Neg Hx   . Esophageal cancer Neg Hx   . Cancer Father     lung  . Heart attack Father   . Diabetes Brother   . Heart disease Brother   . Hyperlipidemia Brother   . Diabetes Brother    Social History  Substance Use Topics  . Smoking status: Former Smoker    Types: Cigarettes    Quit date: 02/09/1961  . Smokeless tobacco: Never Used  . Alcohol Use: No   OB History    Gravida Para Term Preterm AB TAB SAB Ectopic Multiple Living            1     Review of Systems  Constitutional: Positive for fever, activity change, appetite change and fatigue.  HENT: Positive for congestion, rhinorrhea and sore throat.   Respiratory: Positive for cough and shortness of breath. Negative for chest  tightness.   Cardiovascular: Negative for chest pain.  Gastrointestinal: Negative for nausea, vomiting and abdominal pain.  Genitourinary: Negative for vaginal bleeding and vaginal discharge.  Musculoskeletal: Positive for myalgias and arthralgias. Negative for neck pain.  Neurological: Positive for weakness. Negative for dizziness and headaches.  A complete 10 system review of systems was obtained and all systems are negative except as noted in the HPI and PMH.      Allergies  Actonel; Fosamax; Mevacor; Miacalcin; and Mobic  Home Medications   Prior to Admission medications   Medication Sig Start Date End Date Taking? Authorizing Provider  aspirin EC 81 MG tablet Take 81 mg by mouth at bedtime.   Yes Historical Provider, MD  cetirizine (ZYRTEC) 10 MG tablet Take 10 mg by mouth at bedtime.    Yes Historical Provider, MD  Cholecalciferol (VITAMIN D) 2000 UNITS CAPS Take 1 capsule by mouth at bedtime.   Yes Historical Provider, MD   diazepam (VALIUM) 5 MG tablet TAKE ONE TABLET BY MOUTH EVERY SIX HOURS AS NEEDED 07/13/15  Yes Chipper Herb, MD  diltiazem (CARDIZEM CD) 240 MG 24 hr capsule TAKE ONE CAPSULE BY MOUTH ONE TIME DAILY 08/21/15  Yes Satira Sark, MD  ferrous sulfate 325 (65 FE) MG tablet Take 325 mg by mouth at bedtime.   Yes Historical Provider, MD  montelukast (SINGULAIR) 10 MG tablet TAKE 1 TABLET (10 MG TOTAL) BY MOUTH AT BEDTIME. 05/06/15  Yes Chipper Herb, MD  Multiple Vitamins-Minerals (PRESERVISION/LUTEIN PO) Take 1 capsule by mouth at bedtime.    Yes Historical Provider, MD  NEXIUM 40 MG capsule TAKE 1 CAPSULE (40 MG TOTAL)  BY MOUTH DAILY. 07/16/15  Yes Chipper Herb, MD  nitroGLYCERIN (NITROLINGUAL) 0.4 MG/SPRAY spray Use one spray under tongue as needed for chest pain. May repeat dose in 5 minutes. If chest pain not relieved than call 911. 10/18/14  Yes Chipper Herb, MD  PROAIR HFA 108 [phone] Base) MCG/ACT inhaler INHALE TWO PUFFS BY MOUTH 4 TIMES DAILY AS NEEDED 08/23/15  Yes Chipper Herb, MD  simvastatin (ZOCOR) 10 MG tablet ONE TABLET DAILY 07/12/15  Yes Chipper Herb, MD   BP 110/61 mmHg  Pulse 93  Temp(Src) 99.1 F (37.3 C) (Oral)  Resp 19  Ht 5\' 4"  (1.626 m)  Wt 145 lb (65.772 kg)  BMI 24.88 kg/m2  SpO2 96% Physical Exam  Constitutional: She is oriented to person, place, and time. She appears well-developed and well-nourished. No distress.  HENT:  Head: Normocephalic and atraumatic.  Mouth/Throat: Oropharynx is clear and moist. No oropharyngeal exudate.  OP erythema, no assymetry  Eyes: Conjunctivae and EOM are normal. Pupils are equal, round, and reactive to light.  Neck: Normal range of motion. Neck supple.  No meningismus.  Cardiovascular: Normal rate, normal heart sounds and intact distal pulses.   No murmur heard. Tachycardic 100s  Pulmonary/Chest: Effort normal and breath sounds normal. No respiratory distress. She has no wheezes.  Abdominal: Soft. There is no tenderness.  There is no rebound and no guarding.  Musculoskeletal: Normal range of motion. She exhibits no edema or tenderness.  Neurological: She is alert and oriented to person, place, and time. No cranial nerve deficit. She exhibits normal muscle tone. Coordination normal.  No ataxia on finger to nose bilaterally. No pronator drift. 5/5 strength throughout. CN 2-12 intact.Equal grip strength. Sensation intact.   Skin: Skin is warm.  Psychiatric: She has a normal mood and affect. Her behavior is normal.  Nursing note and vitals reviewed.   ED Course  Procedures (including critical care time) Labs Review Labs Reviewed  CBC WITH DIFFERENTIAL/PLATELET - Abnormal; Notable for the following:    Hemoglobin 11.7 (*)    Platelets 124 (*)    Lymphs Abs 0.4 (*)    All other components within normal limits  COMPREHENSIVE METABOLIC PANEL - Abnormal; Notable for the following:    Glucose, Bld 108 (*)    Calcium 8.5 (*)    Total Protein 6.0 (*)    All other components within normal limits  INFLUENZA PANEL BY PCR (TYPE A & B, H1N1) - Abnormal; Notable for the following:    Influenza A By PCR POSITIVE (*)    All other components within normal limits  RAPID STREP SCREEN (NOT AT [facility])  URINE CULTURE  CULTURE, BLOOD (ROUTINE X 2)  CULTURE, BLOOD (ROUTINE X 2)  CULTURE, GROUP A STREP ([place])  CULTURE, EXPECTORATED SPUTUM-ASSESSMENT  GRAM STAIN  LACTIC ACID, PLASMA  LACTIC ACID, PLASMA  URINALYSIS, ROUTINE W REFLEX MICROSCOPIC (NOT AT [facility])  STREP PNEUMONIAE URINARY ANTIGEN  I-STAT CG4 LACTIC ACID, ED  I-STAT CG4 LACTIC ACID, ED    Imaging Review Dg Chest 2 View  10/29/2015  CLINICAL DATA:  75 year old female with cough, chest pain and fever for the past 3-4 days EXAM: CHEST  2 VIEW COMPARISON:  Prior chest x-ray 06/05/2014 FINDINGS: Cardiac and mediastinal contours appear similar. There is a large hiatal hernia. Interval development of a small left pleural effusion and associated left lower lobe opacity.  Otherwise, similar appearance of a background bronchitic change and interstitial prominence. No acute osseous abnormality. IMPRESSION: 1. Small left pleural effusion and associated left lower lobe opacity which is favored to reflect atelectasis. However, in the setting of cough and fever, early left lower lobe infiltrate is difficult to exclude. 2. Large hiatal hernia. 3. Aortic atherosclerosis. 4. Background chronic bronchitic changes without significant interval change. Electronically Signed   By: Jacqulynn Cadet M.D.   On: 10/29/2015 15:36   I have personally reviewed and evaluated these images and lab results as part of my medical decision-making.   EKG Interpretation   Date/Time:  Monday October 29 2015 17:33:03 EDT Ventricular Rate:  99 PR Interval:  186 QRS Duration: 74 QT Interval:  347 QTC Calculation: 445 R Axis:   48 Text Interpretation:  Sinus rhythm Low voltage, precordial leads No  significant change was found Confirmed by Wyvonnia Dusky  MD, Shevonne Wolf [phone]) on  10/29/2015 5:40:34 PM      MDM   Final diagnoses:  CAP (community acquired pneumonia)   2 days of cough, body aches, chills, sore throat and fever. No distress. No wheezing on exam. Febrile on arrival.  X-ray obtained in triage shows small left pleural effusion and left lower lobe opacity.  Treat as CAP. No recent hospitalizations but has been visiting mother in hospital.   No hypoxia but does have dyspnea with exertion. Admission dw/ Dr. Shanon Brow.  Flu swab pending.   Ezequiel Essex, MD 10/30/15 0000

## 2015-10-29 NOTE — ED Notes (Signed)
Ambulated Pt Pt spo2 was 98 pulse was 108

## 2015-10-29 NOTE — ED Notes (Signed)
PT c/o cough productive with yellow sputum, sore throat, fever, runny nose, generalized weakness and body aches since yesterday.

## 2015-10-30 ENCOUNTER — Encounter (HOSPITAL_COMMUNITY): Payer: Self-pay | Admitting: *Deleted

## 2015-10-30 DIAGNOSIS — J45909 Unspecified asthma, uncomplicated: Secondary | ICD-10-CM | POA: Diagnosis present

## 2015-10-30 DIAGNOSIS — I471 Supraventricular tachycardia: Secondary | ICD-10-CM | POA: Diagnosis not present

## 2015-10-30 DIAGNOSIS — I739 Peripheral vascular disease, unspecified: Secondary | ICD-10-CM | POA: Diagnosis present

## 2015-10-30 DIAGNOSIS — E785 Hyperlipidemia, unspecified: Secondary | ICD-10-CM | POA: Diagnosis present

## 2015-10-30 DIAGNOSIS — F411 Generalized anxiety disorder: Secondary | ICD-10-CM | POA: Diagnosis present

## 2015-10-30 DIAGNOSIS — H3530 Unspecified macular degeneration: Secondary | ICD-10-CM | POA: Diagnosis present

## 2015-10-30 DIAGNOSIS — Z87891 Personal history of nicotine dependence: Secondary | ICD-10-CM | POA: Diagnosis not present

## 2015-10-30 DIAGNOSIS — Z809 Family history of malignant neoplasm, unspecified: Secondary | ICD-10-CM | POA: Diagnosis not present

## 2015-10-30 DIAGNOSIS — J189 Pneumonia, unspecified organism: Secondary | ICD-10-CM | POA: Diagnosis not present

## 2015-10-30 DIAGNOSIS — D61818 Other pancytopenia: Secondary | ICD-10-CM | POA: Diagnosis present

## 2015-10-30 DIAGNOSIS — Z853 Personal history of malignant neoplasm of breast: Secondary | ICD-10-CM | POA: Diagnosis not present

## 2015-10-30 DIAGNOSIS — Z8249 Family history of ischemic heart disease and other diseases of the circulatory system: Secondary | ICD-10-CM | POA: Diagnosis not present

## 2015-10-30 DIAGNOSIS — J1100 Influenza due to unidentified influenza virus with unspecified type of pneumonia: Secondary | ICD-10-CM | POA: Diagnosis not present

## 2015-10-30 DIAGNOSIS — J09X1 Influenza due to identified novel influenza A virus with pneumonia: Secondary | ICD-10-CM | POA: Diagnosis present

## 2015-10-30 DIAGNOSIS — Z85828 Personal history of other malignant neoplasm of skin: Secondary | ICD-10-CM | POA: Diagnosis not present

## 2015-10-30 DIAGNOSIS — R05 Cough: Secondary | ICD-10-CM | POA: Diagnosis not present

## 2015-10-30 DIAGNOSIS — Z823 Family history of stroke: Secondary | ICD-10-CM | POA: Diagnosis not present

## 2015-10-30 DIAGNOSIS — J440 Chronic obstructive pulmonary disease with acute lower respiratory infection: Secondary | ICD-10-CM | POA: Diagnosis present

## 2015-10-30 DIAGNOSIS — Z833 Family history of diabetes mellitus: Secondary | ICD-10-CM | POA: Diagnosis not present

## 2015-10-30 DIAGNOSIS — J9600 Acute respiratory failure, unspecified whether with hypoxia or hypercapnia: Secondary | ICD-10-CM | POA: Diagnosis present

## 2015-10-30 DIAGNOSIS — Z86010 Personal history of colonic polyps: Secondary | ICD-10-CM | POA: Diagnosis not present

## 2015-10-30 DIAGNOSIS — Z9011 Acquired absence of right breast and nipple: Secondary | ICD-10-CM | POA: Diagnosis not present

## 2015-10-30 LAB — STREP PNEUMONIAE URINARY ANTIGEN: Strep Pneumo Urinary Antigen: NEGATIVE

## 2015-10-30 MED ORDER — ALBUTEROL SULFATE (2.5 MG/3ML) 0.083% IN NEBU: 3.0000 mL | INHALATION_SOLUTION | RESPIRATORY_TRACT | Status: DC | PRN

## 2015-10-30 MED ORDER — ACETAMINOPHEN 325 MG PO TABS
650.0000 mg | ORAL_TABLET | Freq: Four times a day (QID) | ORAL | Status: DC | PRN
  Administered 2015-10-30 – 2015-11-01 (×3): 650 mg via ORAL
  Filled 2015-10-30 (×3): qty 2

## 2015-10-30 MED ORDER — OSELTAMIVIR PHOSPHATE 30 MG PO CAPS
30.0000 mg | ORAL_CAPSULE | Freq: Two times a day (BID) | ORAL | Status: DC
  Administered 2015-10-30 – 2015-11-03 (×10): 30 mg via ORAL
  Filled 2015-10-30 (×15): qty 1

## 2015-10-30 NOTE — Progress Notes (Signed)
[facility] PROGRESS NOTE  Krystal [name] [id number] DOB: Feb 28, 1941 DOA: 10/29/2015 PCP: Redge Gainer, MD  Assessment/Plan: 1. Acute respiratory failure- improving, in setting of influenza, chest x-ray showing small left pleural effusion and possible infiltrate. Patient currently on Tamiflu and antibiotics for pneumonia. Continue ceftriaxone and Zithromax. Follow blood culture results. 2. History of PSVT- patient in normal sinus rhythm at this time, continue Cardizem 240 mg by mouth daily. 3. Hyperlipidemia- continue simvastatin  Code Status: Full code Family Communication: No family present at bedside Disposition Plan: Pending improvement in respiratory status   Consultants:  None  Procedures:  None  Antibiotics:  Ceftriaxone   (Zithromaxate)  HPI/Subjective: 75 yo female with 3 days of cough, general malaise and fever. Denies any n/v/d. No chest pain or abdominal pain. No le edema or swelling. Has uri symptoms also last couple of days. Pt has pleural effusion on cxr. No recent abx or hospitalizations.  Influenza PCR is positive. Patient started on Tamiflu.  Denies shortness of breath at this time   Objective: Filed Vitals:   10/30/15 0106 10/30/15 0519  BP: 109/53 94/52  Pulse: 94 88  Temp: 100.3 F (37.9 C) 100.7 F (38.2 C)  Resp: 21 20   No intake or output data in the 24 hours ending 10/30/15 1035 Filed Weights   10/29/15 1451  Weight: 65.772 kg (145 lb)    Exam:   General:  appears in no acute distress   Cardiovascular- S1-S2 regular   Respiratory - clear to auscultation bilaterally. No wheezing or crackles.   AbdomenSoft, nontender, no organomegaly  Musculoskeletal: No edema noted in the lower extremities       Data Reviewed: Basic Metabolic Panel:  Recent Labs Lab 10/29/15 1515  NA 136  K 4.0  CL 105  CO2 24  GLUCOSE 108*  BUN 20  CREATININE 0.75  CALCIUM 8.5*   Liver Function Tests:  Recent Labs Lab  10/29/15 1515  AST 31  ALT 22  ALKPHOS 45  BILITOT 0.5  PROT 6.0*  ALBUMIN 3.7    CBC:  Recent Labs Lab 10/29/15 1515  WBC 4.7  NEUTROABS 3.9  HGB 11.7*  HCT 36.4  MCV 94.1  PLT 124*    CBG: No results for input(s): GLUCAP in the last 168 hours.  Recent Results (from the past 240 hour(s))  Rapid strep screen     Status: None   Collection Time: 10/29/15  4:50 PM  Result Value Ref Range Status   Streptococcus, Group A Screen (Direct) NEGATIVE NEGATIVE Final    Comment: (NOTE) A Rapid Antigen test may result negative if the antigen level in the sample is below the detection level of this test. The FDA has not cleared this test as a stand-alone test therefore the rapid antigen negative result has reflexed to a Group A Strep culture.   Blood culture (routine x 2)     Status: None (Preliminary result)   Collection Time: 10/29/15  5:05 PM  Result Value Ref Range Status   Specimen Description BLOOD LEFT ARM  Final   Special Requests BOTTLES DRAWN AEROBIC AND ANAEROBIC 6CC EACH  Final   Culture NO GROWTH < 24 HOURS  Final   Report Status PENDING  Incomplete  Blood culture (routine x 2)     Status: None (Preliminary result)   Collection Time: 10/29/15  5:19 PM  Result Value Ref Range Status   Specimen Description BLOOD LEFT HAND  Final   Special Requests   Final  BOTTLES DRAWN AEROBIC AND ANAEROBIC AEB=8CC ANA=6CC   Culture NO GROWTH < 24 HOURS  Final   Report Status PENDING  Incomplete     Studies: Dg Chest 2 View  10/29/2015  CLINICAL DATA:  75 year old female with cough, chest pain and fever for the past 3-4 days EXAM: CHEST  2 VIEW COMPARISON:  Prior chest x-ray 06/05/2014 FINDINGS: Cardiac and mediastinal contours appear similar. There is a large hiatal hernia. Interval development of a small left pleural effusion and associated left lower lobe opacity. Otherwise, similar appearance of a background bronchitic change and interstitial prominence. No acute osseous  abnormality. IMPRESSION: 1. Small left pleural effusion and associated left lower lobe opacity which is favored to reflect atelectasis. However, in the setting of cough and fever, early left lower lobe infiltrate is difficult to exclude. 2. Large hiatal hernia. 3. Aortic atherosclerosis. 4. Background chronic bronchitic changes without significant interval change. Electronically Signed   By: Jacqulynn Cadet M.D.   On: 10/29/2015 15:36    Scheduled Meds: . aspirin EC  81 mg Oral QHS  . azithromycin  500 mg Intravenous Q24H  . cefTRIAXone (ROCEPHIN)  IV  1 g Intravenous Q24H  . cholecalciferol  2,000 Units Oral QHS  . diltiazem  240 mg Oral Daily  . ferrous sulfate  325 mg Oral QHS  . oseltamivir  30 mg Oral BID  . sodium chloride flush  3 mL Intravenous Q12H   Continuous Infusions:   Principal Problem:   CAP (community acquired pneumonia) Active Problems:   Anxiety state   Asthma   PSVT (paroxysmal supraventricular tachycardia) ([place])    Time spent: 25 min    [place] [facility] Pager [phone]. If 7PM-7AM, please contact night-coverage at [email or web address], password [facility] 10/30/2015, 10:35 AM

## 2015-10-31 DIAGNOSIS — J189 Pneumonia, unspecified organism: Secondary | ICD-10-CM

## 2015-10-31 DIAGNOSIS — J1100 Influenza due to unidentified influenza virus with unspecified type of pneumonia: Secondary | ICD-10-CM

## 2015-10-31 LAB — BASIC METABOLIC PANEL
Anion gap: 5 (ref 5–15)
BUN: 11 mg/dL (ref 6–20)
CHLORIDE: 103 mmol/L (ref 101–111)
CO2: 27 mmol/L (ref 22–32)
CREATININE: 0.61 mg/dL (ref 0.44–1.00)
Calcium: 7.8 mg/dL — ABNORMAL LOW (ref 8.9–10.3)
GFR calc Af Amer: >60 mL/min (ref >60)
GFR calc non Af Amer: >60 mL/min (ref >60)
GLUCOSE: 173 mg/dL — AB (ref 65–99)
Potassium: 3.4 mmol/L — ABNORMAL LOW (ref 3.5–5.1)
Sodium: 135 mmol/L (ref 135–145)

## 2015-10-31 LAB — CBC
HEMATOCRIT: 32.4 % — AB (ref 36.0–46.0)
HEMOGLOBIN: 10.4 g/dL — AB (ref 12.0–15.0)
MCH: 30.6 pg (ref 26.0–34.0)
MCHC: 32.1 g/dL (ref 30.0–36.0)
MCV: 95.3 fL (ref 78.0–100.0)
Platelets: 89 10*3/uL — ABNORMAL LOW (ref 150–400)
RBC: 3.4 MIL/uL — ABNORMAL LOW (ref 3.87–5.11)
RDW: 12.8 % (ref 11.5–15.5)
WBC: 2.4 10*3/uL — ABNORMAL LOW (ref 4.0–10.5)

## 2015-10-31 LAB — URINE CULTURE

## 2015-10-31 MED ORDER — SODIUM CHLORIDE 0.9 % IV SOLN
INTRAVENOUS | Status: DC
  Administered 2015-10-31: 1 mL via INTRAVENOUS
  Administered 2015-11-01 – 2015-11-03 (×3): via INTRAVENOUS

## 2015-10-31 MED ORDER — DM-GUAIFENESIN ER 30-600 MG PO TB12
1.0000 | ORAL_TABLET | Freq: Two times a day (BID) | ORAL | Status: DC
  Administered 2015-10-31 – 2015-11-03 (×7): 1 via ORAL
  Filled 2015-10-31 (×6): qty 1

## 2015-10-31 MED ORDER — PANTOPRAZOLE SODIUM 40 MG PO TBEC
40.0000 mg | DELAYED_RELEASE_TABLET | Freq: Every day | ORAL | Status: DC
  Administered 2015-10-31 – 2015-11-03 (×4): 40 mg via ORAL
  Filled 2015-10-31 (×3): qty 1

## 2015-10-31 MED ORDER — POTASSIUM CHLORIDE CRYS ER 20 MEQ PO TBCR: 40.0000 meq | EXTENDED_RELEASE_TABLET | Freq: Once | ORAL | Status: DC

## 2015-10-31 NOTE — Progress Notes (Signed)
[facility] PROGRESS NOTE  MALEYNA MCEUEN [id number] DOB: [birth year] DOA: 10/29/2015 PCP: Redge Gainer, MD  Assessment/Plan: 1-Acute respiratory failure- improving, in setting of influenza, chest x-ray showing small left pleural effusion and possible infiltrate. Patient currently on Tamiflu and antibiotics for pneumonia.  Continue ceftriaxone and Zithromax. Follow blood culture results. Start guaifenesin for cough.   2-History of PSVT- patient in normal sinus rhythm at this time, continue Cardizem 240 mg by mouth daily.  3-Hyperlipidemia- continue simvastatin  Code Status: Full code Family Communication: No family present at bedside Disposition Plan: Pending improvement in respiratory status   Consultants:  None  Procedures:  None  Antibiotics:  Ceftriaxone   (Zithromaxate)  HPI/Subjective: Still coughing , relates pain after coughing.   Objective: Filed Vitals:   10/30/15 2114 10/31/15 0616  BP: 98/49 121/59  Pulse: 84 86  Temp: 98.9 F (37.2 C) 99 F (37.2 C)  Resp: 20 20    Intake/Output Summary (Last 24 hours) at 10/31/15 1144 Last data filed at 10/31/15 0941  Gross per 24 hour  Intake    243 ml  Output      0 ml  Net    243 ml   Filed Weights   10/29/15 1451  Weight: 65.772 kg (145 lb)    Exam:   General:  appears in no acute distress   Cardiovascular- S1-S2 regular   Respiratory - clear to auscultation bilaterally. No wheezing or crackles.   AbdomenSoft, nontender, no organomegaly  Musculoskeletal: No edema noted in the lower extremities       Data Reviewed: Basic Metabolic Panel:  Recent Labs Lab 10/29/15 1515  NA 136  K 4.0  CL 105  CO2 24  GLUCOSE 108*  BUN 20  CREATININE 0.75  CALCIUM 8.5*   Liver Function Tests:  Recent Labs Lab 10/29/15 1515  AST 31  ALT 22  ALKPHOS 45  BILITOT 0.5  PROT 6.0*  ALBUMIN 3.7    CBC:  Recent Labs Lab 10/29/15 1515  WBC 4.7  NEUTROABS 3.9  HGB 11.7*   HCT 36.4  MCV 94.1  PLT 124*    CBG: No results for input(s): GLUCAP in the last 168 hours.  Recent Results (from the past 240 hour(s))  Rapid strep screen     Status: None   Collection Time: 10/29/15  4:50 PM  Result Value Ref Range Status   Streptococcus, Group A Screen (Direct) NEGATIVE NEGATIVE Final    Comment: (NOTE) A Rapid Antigen test may result negative if the antigen level in the sample is below the detection level of this test. The FDA has not cleared this test as a stand-alone test therefore the rapid antigen negative result has reflexed to a Group A Strep culture.   Culture, group A strep     Status: None (Preliminary result)   Collection Time: 10/29/15  4:50 PM  Result Value Ref Range Status   Specimen Description THROAT  Final   Special Requests NONE Reflexed from [id number]  Final   Culture   Final    CULTURE REINCUBATED FOR BETTER GROWTH Performed at [facility]    Report Status PENDING  Incomplete  Blood culture (routine x 2)     Status: None (Preliminary result)   Collection Time: 10/29/15  5:05 PM  Result Value Ref Range Status   Specimen Description BLOOD LEFT ARM  Final   Special Requests BOTTLES DRAWN AEROBIC AND ANAEROBIC 6CC EACH  Final   Culture NO GROWTH 2 DAYS  Final   Report Status PENDING  Incomplete  Blood culture (routine x 2)     Status: None (Preliminary result)   Collection Time: 10/29/15  5:19 PM  Result Value Ref Range Status   Specimen Description BLOOD LEFT HAND  Final   Special Requests   Final    BOTTLES DRAWN AEROBIC AND ANAEROBIC AEB=8CC ANA=6CC   Culture NO GROWTH 2 DAYS  Final   Report Status PENDING  Incomplete     Studies: Dg Chest 2 View  10/29/2015  CLINICAL DATA:  75 year old female with cough, chest pain and fever for the past 3-4 days EXAM: CHEST  2 VIEW COMPARISON:  Prior chest x-ray 06/05/2014 FINDINGS: Cardiac and mediastinal contours appear similar. There is a large hiatal hernia. Interval development of a  small left pleural effusion and associated left lower lobe opacity. Otherwise, similar appearance of a background bronchitic change and interstitial prominence. No acute osseous abnormality. IMPRESSION: 1. Small left pleural effusion and associated left lower lobe opacity which is favored to reflect atelectasis. However, in the setting of cough and fever, early left lower lobe infiltrate is difficult to exclude. 2. Large hiatal hernia. 3. Aortic atherosclerosis. 4. Background chronic bronchitic changes without significant interval change. Electronically Signed   By: Jacqulynn Cadet M.D.   On: 10/29/2015 15:36    Scheduled Meds: . aspirin EC  81 mg Oral QHS  . azithromycin  500 mg Intravenous Q24H  . cefTRIAXone (ROCEPHIN)  IV  1 g Intravenous Q24H  . cholecalciferol  2,000 Units Oral QHS  . diltiazem  240 mg Oral Daily  . ferrous sulfate  325 mg Oral QHS  . oseltamivir  30 mg Oral BID  . sodium chloride flush  3 mL Intravenous Q12H   Continuous Infusions:   Principal Problem:   CAP (community acquired pneumonia) Active Problems:   Anxiety state   Asthma   PSVT (paroxysmal supraventricular tachycardia) ([place])   Influenza with pneumonia    Time spent: 25 min    Delorese Sellin, [place] [facility] Pager [phone]. If 7PM-7AM, please contact night-coverage at [email or web address], password [facility] 10/31/2015, 11:44 AM  LOS: 1 day

## 2015-11-01 DIAGNOSIS — I471 Supraventricular tachycardia: Secondary | ICD-10-CM

## 2015-11-01 LAB — CULTURE, GROUP A STREP (THRC)

## 2015-11-01 LAB — CBC WITH DIFFERENTIAL/PLATELET
BASOS ABS: 0 10*3/uL (ref 0.0–0.1)
BASOS PCT: 0 %
EOS ABS: 0.1 10*3/uL (ref 0.0–0.7)
Eosinophils Relative: 4 %
HCT: 33.2 % — ABNORMAL LOW (ref 36.0–46.0)
HEMOGLOBIN: 10.5 g/dL — AB (ref 12.0–15.0)
Lymphocytes Relative: 33 %
Lymphs Abs: 0.9 10*3/uL (ref 0.7–4.0)
MCH: 30.4 pg (ref 26.0–34.0)
MCHC: 31.6 g/dL (ref 30.0–36.0)
MCV: 96.2 fL (ref 78.0–100.0)
MONOS PCT: 11 %
Monocytes Absolute: 0.3 10*3/uL (ref 0.1–1.0)
NEUTROS ABS: 1.4 10*3/uL — AB (ref 1.7–7.7)
NEUTROS PCT: 51 %
Platelets: 97 10*3/uL — ABNORMAL LOW (ref 150–400)
RBC: 3.45 MIL/uL — ABNORMAL LOW (ref 3.87–5.11)
RDW: 12.2 % (ref 11.5–15.5)
WBC: 2.7 10*3/uL — ABNORMAL LOW (ref 4.0–10.5)

## 2015-11-01 LAB — BASIC METABOLIC PANEL
ANION GAP: 6 (ref 5–15)
BUN: 8 mg/dL (ref 6–20)
CALCIUM: 7.9 mg/dL — AB (ref 8.9–10.3)
CO2: 27 mmol/L (ref 22–32)
CREATININE: 0.49 mg/dL (ref 0.44–1.00)
Chloride: 107 mmol/L (ref 101–111)
Glucose, Bld: 91 mg/dL (ref 65–99)
Potassium: 3.5 mmol/L (ref 3.5–5.1)
SODIUM: 140 mmol/L (ref 135–145)

## 2015-11-01 MED ORDER — TRAMADOL HCL 50 MG PO TABS
50.0000 mg | ORAL_TABLET | Freq: Two times a day (BID) | ORAL | Status: DC | PRN
  Administered 2015-11-01: 50 mg via ORAL
  Filled 2015-11-01: qty 1

## 2015-11-01 MED ORDER — AZITHROMYCIN 250 MG PO TABS
500.0000 mg | ORAL_TABLET | Freq: Every day | ORAL | Status: DC
  Administered 2015-11-01 – 2015-11-02 (×2): 500 mg via ORAL
  Filled 2015-11-01 (×2): qty 2

## 2015-11-01 NOTE — Progress Notes (Signed)
[facility] PROGRESS NOTE  WHITTLEY [name] [id number] DOB: 07-26-1941 DOA: 10/29/2015 PCP: Redge Gainer, MD  Assessment/Plan: 1-Acute respiratory failure- improving, in setting of influenza, chest x-ray showing small left pleural effusion and possible infiltrate. Patient currently on Tamiflu and antibiotics for pneumonia.  Continue ceftriaxone and Zithromax. Follow blood culture results. Continue with guaifenesin for cough.   2-History of PSVT- patient in normal sinus rhythm at this time, continue Cardizem 240 mg by mouth daily.  3-Hyperlipidemia- continue simvastatin 4-Pancytopenia; suspect related to acute illness.  Repeat CB in am.   Code Status: Full code Family Communication: No family present at bedside Disposition Plan: Pending improvement in respiratory status   Consultants:  None  Procedures:  None  Antibiotics:  Ceftriaxone   (Zithromaxate)  HPI/Subjective: Report mild headaches, from not been able to sleep.  Still coughing   Objective: Filed Vitals:   11/01/15 0616 11/01/15 1003  BP: 121/55   Pulse: 76 89  Temp: 98.2 F (36.8 C)   Resp: 18     Intake/Output Summary (Last 24 hours) at 11/01/15 1333 Last data filed at 11/01/15 0737  Gross per 24 hour  Intake    240 ml  Output   2300 ml  Net  -2060 ml   Filed Weights   10/29/15 1451  Weight: 65.772 kg (145 lb)    Exam:   General:  appears in no acute distress   Cardiovascular- S1-S2 regular   Respiratory - clear to auscultation bilaterally. No wheezing or crackles.   AbdomenSoft, nontender, no organomegaly  Musculoskeletal: No edema noted in the lower extremities       Data Reviewed: Basic Metabolic Panel:  Recent Labs Lab 10/29/15 1515 10/31/15 1338 11/01/15 0545  NA 136 135 140  K 4.0 3.4* 3.5  CL 105 103 107  CO2 24 27 27   GLUCOSE 108* 173* 91  BUN 20 11 8   CREATININE 0.75 0.61 0.49  CALCIUM 8.5* 7.8* 7.9*   Liver Function Tests:  Recent Labs Lab  10/29/15 1515  AST 31  ALT 22  ALKPHOS 45  BILITOT 0.5  PROT 6.0*  ALBUMIN 3.7    CBC:  Recent Labs Lab 10/29/15 1515 10/31/15 1338 11/01/15 0545  WBC 4.7 2.4* 2.7*  NEUTROABS 3.9  --  1.4*  HGB 11.7* 10.4* 10.5*  HCT 36.4 32.4* 33.2*  MCV 94.1 95.3 96.2  PLT 124* 89* 97*    CBG: No results for input(s): GLUCAP in the last 168 hours.  Recent Results (from the past 240 hour(s))  Rapid strep screen     Status: None   Collection Time: 10/29/15  4:50 PM  Result Value Ref Range Status   Streptococcus, Group A Screen (Direct) NEGATIVE NEGATIVE Final    Comment: (NOTE) A Rapid Antigen test may result negative if the antigen level in the sample is below the detection level of this test. The FDA has not cleared this test as a stand-alone test therefore the rapid antigen negative result has reflexed to a Group A Strep culture.   Culture, group A strep     Status: None (Preliminary result)   Collection Time: 10/29/15  4:50 PM  Result Value Ref Range Status   Specimen Description THROAT  Final   Special Requests NONE Reflexed from [id number]  Final   Culture   Final    CULTURE REINCUBATED FOR BETTER GROWTH Performed at [facility]    Report Status PENDING  Incomplete  Urine culture     Status: None  Collection Time: 10/29/15  4:55 PM  Result Value Ref Range Status   Specimen Description URINE, CLEAN CATCH  Final   Special Requests NONE  Final   Culture   Final    MULTIPLE SPECIES PRESENT, SUGGEST RECOLLECTION Performed at [facility]    Report Status 10/31/2015 FINAL  Final  Blood culture (routine x 2)     Status: None (Preliminary result)   Collection Time: 10/29/15  5:05 PM  Result Value Ref Range Status   Specimen Description BLOOD LEFT ARM  Final   Special Requests BOTTLES DRAWN AEROBIC AND ANAEROBIC [place]  Final   Culture NO GROWTH 3 DAYS  Final   Report Status PENDING  Incomplete  Blood culture (routine x 2)     Status: None (Preliminary  result)   Collection Time: 10/29/15  5:19 PM  Result Value Ref Range Status   Specimen Description BLOOD LEFT HAND  Final   Special Requests   Final    BOTTLES DRAWN AEROBIC AND ANAEROBIC AEB=8CC ANA=6CC   Culture NO GROWTH 3 DAYS  Final   Report Status PENDING  Incomplete     Studies: No results found.  Scheduled Meds: . aspirin EC  81 mg Oral QHS  . azithromycin  500 mg Oral QHS  . cefTRIAXone (ROCEPHIN)  IV  1 g Intravenous Q24H  . cholecalciferol  2,000 Units Oral QHS  . dextromethorphan-guaiFENesin  1 tablet Oral BID  . diltiazem  240 mg Oral Daily  . ferrous sulfate  325 mg Oral QHS  . oseltamivir  30 mg Oral BID  . pantoprazole  40 mg Oral Daily  . potassium chloride  40 mEq Oral Once  . sodium chloride flush  3 mL Intravenous Q12H   Continuous Infusions: . sodium chloride 75 mL/hr at 11/01/15 0844    Principal Problem:   CAP (community acquired pneumonia) Active Problems:   Anxiety state   Asthma   PSVT (paroxysmal supraventricular tachycardia) ([place])   Influenza with pneumonia    Time spent: 25 min    Krystal [name], [place] [facility] Pager [phone]. If 7PM-7AM, please contact night-coverage at [email or web address], password [facility] 11/01/2015, 1:33 PM  LOS: 2 days

## 2015-11-01 NOTE — Evaluation (Signed)
Physical Therapy Evaluation Patient Details Name: KANAI HILGER MRN: [id number] DOB: [birth year] Today's Date: 11/01/2015   History of Present Illness  Krystal [name] is a 75yo white female who comes to [facility] on 3/27 p 3d cough, fever, malaise, and URI symptoms. Pt admitted c CAP adn is Flu positive. Pt reports her activity level has been low since 07/28/15 as her mother has been hospitalzied and very ill and she has been with her, although she recently DC to a facility on a ventilator.   Clinical Impression  Pt is at baseline level of functioning for strength and mobility, however, demonstrating moderate impairment of balance, which although a chronic problem is acutely worse. Pt is identified as a high falls risk with Berg Balance score of 36/56, gait speed less than 1.52ms, habitual use of furniture for stability in home, and high level of falls anxiety. Pt will benefit from skilled PT intervention to improve balance, and to decrease risk of falls in the home. The patient does not acknowledge many of these risks, explaining that she simply is 'getting older,' is sleep deprived, or just uses furniture when she is unstable. The pt remain very occupied with the declining health of her mother and recent DC to a facility out of town. She is agreeable that she is more concerned with her mother's wellness than her won at this point. Pt is somewhat agreeable to OPPT follow up after DC. No additional PT services needed at this venue of care at this time.      Follow Up Recommendations Supervision - Intermittent;Outpatient PT    Equipment Recommendations       Recommendations for Other Services       Precautions / Restrictions Precautions Precautions: None      Mobility  Bed Mobility Overal bed mobility: Modified Independent                Transfers Overall transfer level: Modified independent               General transfer comment: 5x STS hands free in  13.67s  Ambulation/Gait Ambulation/Gait assistance: Supervision Ambulation Distance (Feet): 20 Feet Assistive device:  (IV pole) Gait Pattern/deviations: Decreased step length - right;Decreased step length - left     General Gait Details: very slow, unsteady, drowsy; could have walked further if she were feeling less fatgiued.   Stairs            Wheelchair Mobility    Modified Rankin (Stroke Patients Only)       Balance Overall balance assessment: Needs assistance                               Standardized Balance Assessment Standardized Balance Assessment : Berg Balance Test Berg Balance Test Sit to Stand: Able to stand without using hands and stabilize independently Standing Unsupported: Able to stand safely 2 minutes Sitting with Back Unsupported but Feet Supported on Floor or Stool: Able to sit safely and securely 2 minutes Stand to Sit: Sits independently, has uncontrolled descent Transfers: Able to transfer safely, minor use of hands Standing Unsupported with Eyes Closed: Able to stand 10 seconds with supervision Standing Ubsupported with Feet Together: Able to place feet together independently and stand for 1 minute with supervision From Standing, Reach Forward with Outstretched Arm: Can reach forward >12 cm safely (5") From Standing Position, Pick up Object from Floor: Able to pick up shoe safely and easily From Standing  Position, Turn to Look Behind Over each Shoulder: Turn sideways only but maintains balance Turn 360 Degrees: Needs close supervision or verbal cueing Standing Unsupported, Alternately Place Feet on Step/Stool: Needs assistance to keep from falling or unable to try Standing Unsupported, One Foot in Front: Able to take small step independently and hold 30 seconds Standing on One Leg: Tries to lift leg/unable to hold 3 seconds but remains standing independently Total Score: 36         Pertinent Vitals/Pain Pain Assessment:  No/denies pain    Home Living Family/patient expects to be discharged to:: Private residence Living Arrangements: Alone Available Help at Discharge: Family Type of Home: Mobile home Home Access: Ramped entrance     Home Layout: One level Home Equipment: Cane - single point;Walker - 2 wheels Additional Comments: mostly furniture walking, DME at her house, she has been at her mother's house.     Prior Function Level of Independence: Needs assistance   Gait / Transfers Assistance Needed: limited community distances, reliante on AD for balance.      Comments: needs tranportation help from family.      Hand Dominance   Dominant Hand: Left    Extremity/Trunk Assessment   Upper Extremity Assessment: Overall WFL for tasks assessed           Lower Extremity Assessment: Generalized weakness;Overall WFL for tasks assessed         Communication   Communication: No difficulties  Cognition Arousal/Alertness: Awake/alert Behavior During Therapy: WFL for tasks assessed/performed Overall Cognitive Status: Within Functional Limits for tasks assessed       Memory: Decreased recall of precautions              General Comments      Exercises        Assessment/Plan    PT Assessment All further PT needs can be met in the next venue of care  PT Diagnosis Abnormality of gait   PT Problem List Decreased balance;Decreased activity tolerance  PT Treatment Interventions     PT Goals (Current goals can be found in the Care Plan section) Acute Rehab PT Goals Patient Stated Goal: Return to home  PT Goal Formulation: All assessment and education complete, DC therapy    Frequency     Barriers to discharge Decreased caregiver support High Falls Risk     Co-evaluation               End of Session Equipment Utilized During Treatment: Gait belt Activity Tolerance: Patient tolerated treatment well;Patient limited by lethargy Patient left: in bed;with call  bell/phone within reach Nurse Communication: Other (comment)         Time: 9373-4287 PT Time Calculation (min) (ACUTE ONLY): 16 min   Charges:   PT Evaluation $PT Eval Low Complexity: 1 Procedure PT Treatments $Therapeutic Activity: 8-22 mins   PT G Codes:        10:26 AM, November 20, 2015 Etta Grandchild, PT, DPT PRN Physical Therapist at [place] License # [id number] [phone] (wireless)  [phone] (mobile)

## 2015-11-01 NOTE — Consult Note (Signed)
   [facility] CM Inpatient Consult   11/01/2015  Krystal [name] 1941/05/30 [id number]  Patient screened for potential [facility] services. Patient is eligible for [place]. Epic reviewed and there were no identifiable [facility] care management needs at this time. [facility] Care Management services not appropriate at this time. If patient's post hospital needs change please place a [facility] Care Management consult.  For questions please contact:    Royetta Crochet. Laymond Purser, RN, BSN, [place] [phone]) Business Cell  [phone]) Toll Free Office

## 2015-11-02 LAB — CBC WITH DIFFERENTIAL/PLATELET
Basophils Absolute: 0 10*3/uL (ref 0.0–0.1)
Basophils Relative: 0 %
EOS PCT: 7 %
Eosinophils Absolute: 0.2 10*3/uL (ref 0.0–0.7)
HEMATOCRIT: 32.9 % — AB (ref 36.0–46.0)
Hemoglobin: 10.3 g/dL — ABNORMAL LOW (ref 12.0–15.0)
LYMPHS PCT: 39 %
Lymphs Abs: 1.1 10*3/uL (ref 0.7–4.0)
MCH: 30.2 pg (ref 26.0–34.0)
MCHC: 31.3 g/dL (ref 30.0–36.0)
MCV: 96.5 fL (ref 78.0–100.0)
MONO ABS: 0.3 10*3/uL (ref 0.1–1.0)
MONOS PCT: 10 %
NEUTROS ABS: 1.3 10*3/uL — AB (ref 1.7–7.7)
Neutrophils Relative %: 44 %
PLATELETS: 103 10*3/uL — AB (ref 150–400)
RBC: 3.41 MIL/uL — ABNORMAL LOW (ref 3.87–5.11)
RDW: 12.1 % (ref 11.5–15.5)
WBC: 2.9 10*3/uL — ABNORMAL LOW (ref 4.0–10.5)

## 2015-11-02 NOTE — Care Management Important Message (Signed)
Important Message  Patient Details  Name: Krystal [name] MRN: [id number] Date of Birth: 04-11-1941   Medicare Important Message Given:  Yes    Alvie Heidelberg, RN 11/02/2015, 8:35 AM

## 2015-11-02 NOTE — Progress Notes (Signed)
[facility] PROGRESS NOTE  Krystal [name] [id number] DOB: 1941-02-03 DOA: 10/29/2015 PCP: Redge Gainer, MD  Assessment/Plan: 1-Acute respiratory failure- improving, in setting of influenza, chest x-ray showing small left pleural effusion and possible infiltrate.  Continue with Tamiflu day 4 Continue ceftriaxone and Zithromax, day 5 .  blood culture; no growth to date.  Continue with guaifenesin for cough.   2-History of PSVT- patient in normal sinus rhythm at this time, continue Cardizem 240 mg by mouth daily.  3-Hyperlipidemia- continue simvastatin  4-Pancytopenia; suspect related to acute illness.  Wbc increasing, repeat labs in am.   Code Status: Full code Family Communication: No family present at bedside Disposition Plan: Pending improvement in respiratory status   Consultants:  None  Procedures:  None  Antibiotics:  Ceftriaxone   (Zithromaxate)  HPI/Subjective: Not feeling well today. Cough has improved, denies dyspnea. Denies headaches.    Objective: Filed Vitals:   11/01/15 1728 11/02/15 0626  BP: 89/47 106/54  Pulse: 83 73  Temp: 97.7 F (36.5 C) 97.8 F (36.6 C)  Resp: 18 18    Intake/Output Summary (Last 24 hours) at 11/02/15 1345 Last data filed at 11/02/15 0700  Gross per 24 hour  Intake   3590 ml  Output    800 ml  Net   2790 ml   Filed Weights   10/29/15 1451  Weight: 65.772 kg (145 lb)    Exam:   General:  appears in no acute distress   Cardiovascular- S1-S2 regular   Respiratory - clear to auscultation bilaterally. No wheezing or crackles.   AbdomenSoft, nontender, no organomegaly  Musculoskeletal: No edema noted in the lower extremities       Data Reviewed: Basic Metabolic Panel:  Recent Labs Lab 10/29/15 1515 10/31/15 1338 11/01/15 0545  NA 136 135 140  K 4.0 3.4* 3.5  CL 105 103 107  CO2 24 27 27   GLUCOSE 108* 173* 91  BUN 20 11 8   CREATININE 0.75 0.61 0.49  CALCIUM 8.5* 7.8* 7.9*    Liver Function Tests:  Recent Labs Lab 10/29/15 1515  AST 31  ALT 22  ALKPHOS 45  BILITOT 0.5  PROT 6.0*  ALBUMIN 3.7    CBC:  Recent Labs Lab 10/29/15 1515 10/31/15 1338 11/01/15 0545 11/02/15 0547  WBC 4.7 2.4* 2.7* 2.9*  NEUTROABS 3.9  --  1.4* 1.3*  HGB 11.7* 10.4* 10.5* 10.3*  HCT 36.4 32.4* 33.2* 32.9*  MCV 94.1 95.3 96.2 96.5  PLT 124* 89* 97* 103*    CBG: No results for input(s): GLUCAP in the last 168 hours.  Recent Results (from the past 240 hour(s))  Rapid strep screen     Status: None   Collection Time: 10/29/15  4:50 PM  Result Value Ref Range Status   Streptococcus, Group A Screen (Direct) NEGATIVE NEGATIVE Final    Comment: (NOTE) A Rapid Antigen test may result negative if the antigen level in the sample is below the detection level of this test. The FDA has not cleared this test as a stand-alone test therefore the rapid antigen negative result has reflexed to a Group A Strep culture.   Culture, group A strep     Status: None   Collection Time: 10/29/15  4:50 PM  Result Value Ref Range Status   Specimen Description THROAT  Final   Special Requests NONE Reflexed from [id number]  Final   Culture   Final    NO GROUP A STREP (S.PYOGENES) ISOLATED Performed at [facility]  Report Status 11/01/2015 FINAL  Final  Urine culture     Status: None   Collection Time: 10/29/15  4:55 PM  Result Value Ref Range Status   Specimen Description URINE, CLEAN CATCH  Final   Special Requests NONE  Final   Culture   Final    MULTIPLE SPECIES PRESENT, SUGGEST RECOLLECTION Performed at [facility]    Report Status 10/31/2015 FINAL  Final  Blood culture (routine x 2)     Status: None (Preliminary result)   Collection Time: 10/29/15  5:05 PM  Result Value Ref Range Status   Specimen Description BLOOD LEFT ARM  Final   Special Requests BOTTLES DRAWN AEROBIC AND ANAEROBIC [place]  Final   Culture NO GROWTH 4 DAYS  Final   Report Status  PENDING  Incomplete  Blood culture (routine x 2)     Status: None (Preliminary result)   Collection Time: 10/29/15  5:19 PM  Result Value Ref Range Status   Specimen Description BLOOD LEFT HAND  Final   Special Requests   Final    BOTTLES DRAWN AEROBIC AND ANAEROBIC AEB=8CC ANA=6CC   Culture NO GROWTH 4 DAYS  Final   Report Status PENDING  Incomplete     Studies: No results found.  Scheduled Meds: . aspirin EC  81 mg Oral QHS  . azithromycin  500 mg Oral QHS  . cefTRIAXone (ROCEPHIN)  IV  1 g Intravenous Q24H  . cholecalciferol  2,000 Units Oral QHS  . dextromethorphan-guaiFENesin  1 tablet Oral BID  . diltiazem  240 mg Oral Daily  . ferrous sulfate  325 mg Oral QHS  . oseltamivir  30 mg Oral BID  . pantoprazole  40 mg Oral Daily  . potassium chloride  40 mEq Oral Once  . sodium chloride flush  3 mL Intravenous Q12H   Continuous Infusions: . sodium chloride 75 mL/hr at 11/01/15 2117    Principal Problem:   CAP (community acquired pneumonia) Active Problems:   Anxiety state   Asthma   PSVT (paroxysmal supraventricular tachycardia) ([place])   Influenza with pneumonia    Time spent: 25 min    [place], [place] [facility] Pager [phone]. If 7PM-7AM, please contact night-coverage at [email or web address], password [facility] 11/02/2015, 1:45 PM  LOS: 3 days

## 2015-11-03 LAB — CBC WITH DIFFERENTIAL/PLATELET
Basophils Absolute: 0 10*3/uL (ref 0.0–0.1)
Basophils Relative: 1 %
EOS ABS: 0.2 10*3/uL (ref 0.0–0.7)
EOS PCT: 5 %
HCT: 33.3 % — ABNORMAL LOW (ref 36.0–46.0)
Hemoglobin: 10.6 g/dL — ABNORMAL LOW (ref 12.0–15.0)
Lymphocytes Relative: 34 %
Lymphs Abs: 1 10*3/uL (ref 0.7–4.0)
MCH: 29.9 pg (ref 26.0–34.0)
MCHC: 31.8 g/dL (ref 30.0–36.0)
MCV: 93.8 fL (ref 78.0–100.0)
MONO ABS: 0.4 10*3/uL (ref 0.1–1.0)
Monocytes Relative: 13 %
NEUTROS ABS: 1.4 10*3/uL — AB (ref 1.7–7.7)
NEUTROS PCT: 47 %
PLATELETS: 118 10*3/uL — AB (ref 150–400)
RBC: 3.55 MIL/uL — ABNORMAL LOW (ref 3.87–5.11)
RDW: 12.5 % (ref 11.5–15.5)
WBC: 3 10*3/uL — AB (ref 4.0–10.5)

## 2015-11-03 LAB — CULTURE, BLOOD (ROUTINE X 2)
Culture: NO GROWTH
Culture: NO GROWTH

## 2015-11-03 MED ORDER — DM-GUAIFENESIN ER 30-600 MG PO TB12: 1.0000 | ORAL_TABLET | Freq: Two times a day (BID) | ORAL | Status: DC

## 2015-11-03 MED ORDER — CEPHALEXIN 500 MG PO CAPS: 500.0000 mg | ORAL_CAPSULE | Freq: Two times a day (BID) | ORAL | Status: DC

## 2015-11-03 MED ORDER — OSELTAMIVIR PHOSPHATE 30 MG PO CAPS: 30.0000 mg | ORAL_CAPSULE | Freq: Two times a day (BID) | ORAL | Status: DC

## 2015-11-03 MED ORDER — TRAMADOL HCL 50 MG PO TABS
50.0000 mg | ORAL_TABLET | Freq: Two times a day (BID) | ORAL | Status: DC | PRN
Start: 2015-11-03 — End: 2016-12-01

## 2015-11-03 NOTE — Discharge Summary (Signed)
Physician Discharge Summary  Krystal [name] [id number] DOB: Sep 10, 1940 DOA: 10/29/2015  PCP: Redge Gainer, MD  Admit date: 10/29/2015 Discharge date: 11/03/2015  Time spent: 35 minutes  Recommendations for Outpatient Follow-up:  Needs CBC to follow leukopenia   Discharge Diagnoses:    CAP (community acquired pneumonia)   Anxiety state   Asthma   PSVT (paroxysmal supraventricular tachycardia) ([place])   Influenza with pneumonia   Discharge Condition: uimproved  Diet recommendation: heart healthy   Filed Weights   10/29/15 1451  Weight: 65.772 kg (145 lb)    History of present illness:  HPI: 75 yo female with 3 days of cough, general malaise and fever. Denies any n/v/d. No chest pain or abdominal pain. No le edema or swelling. Has uri symptoms also last couple of days. Pt has pleural effusion on cxr. No recent abx or hospitalizations. Referred for admission for pna. o2 sats are normal at rest and with ambulation.  Hospital Course:  1-Acute respiratory failure- improving, in setting of influenza, chest x-ray showing small left pleural effusion and possible infiltrate.  Continue with Tamiflu day 5 Continue ceftriaxone and Zithromax, day 6 . blood culture; no growth to date.  Continue with guaifenesin for cough.  Will provide 2 more days of tamiflu and 3 days of antibiotics  2-History of PSVT- patient in normal sinus rhythm at this time, continue Cardizem 240 mg by mouth daily.  3-Hyperlipidemia- continue simvastatin  4-Pancytopenia; suspect related to acute illness.  Wbc increasing, improving. Needs repeat labs in 1 week   Procedures:  none  Consultations:  none  Discharge Exam: Filed Vitals:   11/02/15 2048 11/03/15 0431  BP: 118/60 122/61  Pulse: 80 84  Temp: 97.8 F (36.6 C) 97.8 F (36.6 C)  Resp: 20 20    General: Alert in no acute distress Cardiovascular: S 1, S 2 RRR Respiratory: CTA  Discharge Instructions   Discharge  Instructions    Diet - low sodium heart healthy    Complete by:  As directed      Increase activity slowly    Complete by:  As directed           Current Discharge Medication List    START taking these medications   Details  cephALEXin (KEFLEX) 500 MG capsule Take 1 capsule (500 mg total) by mouth 2 (two) times daily. Qty: 6 capsule, Refills: 0    dextromethorphan-guaiFENesin (MUCINEX DM) 30-600 MG 12hr tablet Take 1 tablet by mouth 2 (two) times daily. Qty: 20 tablet, Refills: 0    oseltamivir (TAMIFLU) 30 MG capsule Take 1 capsule (30 mg total) by mouth 2 (two) times daily. Qty: 4 capsule, Refills: 0    traMADol (ULTRAM) 50 MG tablet Take 1 tablet (50 mg total) by mouth every 12 (twelve) hours as needed for moderate pain. Qty: 30 tablet, Refills: 0      CONTINUE these medications which have NOT CHANGED   Details  aspirin EC 81 MG tablet Take 81 mg by mouth at bedtime.    cetirizine (ZYRTEC) 10 MG tablet Take 10 mg by mouth at bedtime.     Cholecalciferol (VITAMIN D) 2000 UNITS CAPS Take 1 capsule by mouth at bedtime.    diazepam (VALIUM) 5 MG tablet TAKE ONE TABLET BY MOUTH EVERY SIX HOURS AS NEEDED Qty: 30 tablet, Refills: 1    diltiazem (CARDIZEM CD) 240 MG 24 hr capsule TAKE ONE CAPSULE BY MOUTH ONE TIME DAILY Qty: 90 capsule, Refills: 2    ferrous sulfate  325 (65 FE) MG tablet Take 325 mg by mouth at bedtime.    montelukast (SINGULAIR) 10 MG tablet TAKE 1 TABLET (10 MG TOTAL) BY MOUTH AT BEDTIME. Qty: 90 tablet, Refills: 1    Multiple Vitamins-Minerals (PRESERVISION/LUTEIN PO) Take 1 capsule by mouth at bedtime.     NEXIUM 40 MG capsule TAKE 1 CAPSULE (40 MG TOTAL)  BY MOUTH DAILY. Qty: 90 capsule, Refills: 0    nitroGLYCERIN (NITROLINGUAL) 0.4 MG/SPRAY spray Use one spray under tongue as needed for chest pain. May repeat dose in 5 minutes. If chest pain not relieved than call 911. Qty: 12 g, Refills: 1    PROAIR HFA 108 (90 Base) MCG/ACT inhaler INHALE TWO  PUFFS BY MOUTH 4 TIMES DAILY AS NEEDED Qty: 9 g, Refills: 2    simvastatin (ZOCOR) 10 MG tablet ONE TABLET DAILY Qty: 90 tablet, Refills: 0       Allergies  Allergen Reactions  . Actonel [Risedronate] Nausea And Vomiting and Other (See Comments)    Throat tightness  . Fosamax [Alendronate Sodium] Other (See Comments)    esophagitis  . Mevacor [Lovastatin] Other (See Comments)    myalgias  . Miacalcin [Calcitonin ([place])] Nausea And Vomiting  . Mobic [Meloxicam] Hives and Itching    rash   Follow-up Information    Follow up with Redge Gainer, MD In 1 week.   Specialty:  Family Medicine   Contact information:   [place] [id number] [phone]        The results of significant diagnostics from this hospitalization (including imaging, microbiology, ancillary and laboratory) are listed below for reference.    Significant Diagnostic Studies: Dg Chest 2 View  10/29/2015  CLINICAL DATA:  75 year old female with cough, chest pain and fever for the past 3-4 days EXAM: CHEST  2 VIEW COMPARISON:  Prior chest x-ray 06/05/2014 FINDINGS: Cardiac and mediastinal contours appear similar. There is a large hiatal hernia. Interval development of a small left pleural effusion and associated left lower lobe opacity. Otherwise, similar appearance of a background bronchitic change and interstitial prominence. No acute osseous abnormality. IMPRESSION: 1. Small left pleural effusion and associated left lower lobe opacity which is favored to reflect atelectasis. However, in the setting of cough and fever, early left lower lobe infiltrate is difficult to exclude. 2. Large hiatal hernia. 3. Aortic atherosclerosis. 4. Background chronic bronchitic changes without significant interval change. Electronically Signed   By: Jacqulynn Cadet M.D.   On: 10/29/2015 15:36    Microbiology: Recent Results (from the past 240 hour(s))  Rapid strep screen     Status: None   Collection Time:  10/29/15  4:50 PM  Result Value Ref Range Status   Streptococcus, Group A Screen (Direct) NEGATIVE NEGATIVE Final    Comment: (NOTE) A Rapid Antigen test may result negative if the antigen level in the sample is below the detection level of this test. The FDA has not cleared this test as a stand-alone test therefore the rapid antigen negative result has reflexed to a Group A Strep culture.   Culture, group A strep     Status: None   Collection Time: 10/29/15  4:50 PM  Result Value Ref Range Status   Specimen Description THROAT  Final   Special Requests NONE Reflexed from [id number]  Final   Culture   Final    NO GROUP A STREP (S.PYOGENES) ISOLATED Performed at [facility]    Report Status 11/01/2015 FINAL  Final  Urine  culture     Status: None   Collection Time: 10/29/15  4:55 PM  Result Value Ref Range Status   Specimen Description URINE, CLEAN CATCH  Final   Special Requests NONE  Final   Culture   Final    MULTIPLE SPECIES PRESENT, SUGGEST RECOLLECTION Performed at [facility]    Report Status 10/31/2015 FINAL  Final  Blood culture (routine x 2)     Status: None (Preliminary result)   Collection Time: 10/29/15  5:05 PM  Result Value Ref Range Status   Specimen Description BLOOD LEFT ARM  Final   Special Requests BOTTLES DRAWN AEROBIC AND ANAEROBIC 6CC EACH  Final   Culture NO GROWTH 4 DAYS  Final   Report Status PENDING  Incomplete  Blood culture (routine x 2)     Status: None (Preliminary result)   Collection Time: 10/29/15  5:19 PM  Result Value Ref Range Status   Specimen Description BLOOD LEFT HAND  Final   Special Requests   Final    BOTTLES DRAWN AEROBIC AND ANAEROBIC AEB=8CC ANA=6CC   Culture NO GROWTH 4 DAYS  Final   Report Status PENDING  Incomplete     Labs: Basic Metabolic Panel:  Recent Labs Lab 10/29/15 1515 10/31/15 1338 11/01/15 0545  NA 136 135 140  K 4.0 3.4* 3.5  CL 105 103 107  CO2 24 27 27   GLUCOSE 108* 173* 91  BUN 20 11 8    CREATININE 0.75 0.61 0.49  CALCIUM 8.5* 7.8* 7.9*   Liver Function Tests:  Recent Labs Lab 10/29/15 1515  AST 31  ALT 22  ALKPHOS 45  BILITOT 0.5  PROT 6.0*  ALBUMIN 3.7   No results for input(s): LIPASE, AMYLASE in the last 168 hours. No results for input(s): AMMONIA in the last 168 hours. CBC:  Recent Labs Lab 10/29/15 1515 10/31/15 1338 11/01/15 0545 11/02/15 0547 11/03/15 0604  WBC 4.7 2.4* 2.7* 2.9* 3.0*  NEUTROABS 3.9  --  1.4* 1.3* 1.4*  HGB 11.7* 10.4* 10.5* 10.3* 10.6*  HCT 36.4 32.4* 33.2* 32.9* 33.3*  MCV 94.1 95.3 96.2 96.5 93.8  PLT 124* 89* 97* 103* 118*   Cardiac Enzymes: No results for input(s): CKTOTAL, CKMB, CKMBINDEX, TROPONINI in the last 168 hours. BNP: BNP (last 3 results) No results for input(s): BNP in the last 8760 hours.  ProBNP (last 3 results) No results for input(s): PROBNP in the last 8760 hours.  CBG: No results for input(s): GLUCAP in the last 168 hours.     Signed:  Elmarie Shiley MD.  [facility] 11/03/2015, 11:55 AM

## 2015-11-06 ENCOUNTER — Telehealth: Payer: Self-pay | Admitting: *Deleted

## 2015-11-06 NOTE — Telephone Encounter (Signed)
Call Completed and Appointment Scheduled: Yes, Date: 11/07/15 with Dr Adria Dill INFORMATION Date of Discharge:11/03/15  Discharge [place]  Principal Discharge Diagnosis: Pneumonia  Patient and/or caregiver is knowledgeable of his/her condition(s) and treatment: Yes  MEDICATION RECONCILIATION Medication list reviewed with patient:Yes Discharge Medications reconciled with home medications.   Patient is able to obtain needed medications:Yes  ACTIVITIES OF DAILY LIVING  Is the patient able to perform his/her own ADLs: Yes.    Patient is receiving home health services: No.  PATIENT EDUCATION Questions/Concerns Discussed: Patient is staying at her brother's house until she feels well enough to go back to her own. She feels that she may need home health services once she is home.

## 2015-11-07 ENCOUNTER — Ambulatory Visit (INDEPENDENT_AMBULATORY_CARE_PROVIDER_SITE_OTHER): Payer: Medicare Other | Admitting: Family Medicine

## 2015-11-07 ENCOUNTER — Encounter: Payer: Self-pay | Admitting: Family Medicine

## 2015-11-07 VITALS — BP 96/56 | HR 85 | Temp 97.4°F | Ht 64.0 in | Wt 147.4 lb

## 2015-11-07 DIAGNOSIS — J1100 Influenza due to unidentified influenza virus with unspecified type of pneumonia: Secondary | ICD-10-CM | POA: Diagnosis not present

## 2015-11-07 LAB — CBC WITH DIFFERENTIAL/PLATELET
BASOS ABS: 0 10*3/uL (ref 0.0–0.2)
Basos: 0 %
EOS (ABSOLUTE): 0.2 10*3/uL (ref 0.0–0.4)
Eos: 3 %
Hematocrit: 39.2 % (ref 34.0–46.6)
Hemoglobin: 13.1 g/dL (ref 11.1–15.9)
Immature Grans (Abs): 0 10*3/uL (ref 0.0–0.1)
Immature Granulocytes: 0 %
LYMPHS ABS: 1.2 10*3/uL (ref 0.7–3.1)
Lymphs: 22 %
MCH: 29.2 pg (ref 26.6–33.0)
MCHC: 33.4 g/dL (ref 31.5–35.7)
MCV: 88 fL (ref 79–97)
MONOS ABS: 0.4 10*3/uL (ref 0.1–0.9)
Monocytes: 8 %
Neutrophils Absolute: 3.7 10*3/uL (ref 1.4–7.0)
Neutrophils: 67 %
PLATELETS: 276 10*3/uL (ref 150–379)
RBC: 4.48 x10E6/uL (ref 3.77–5.28)
RDW: 13 % (ref 12.3–15.4)
WBC: 5.5 10*3/uL (ref 3.4–10.8)

## 2015-11-07 NOTE — Progress Notes (Signed)
Subjective:    Patient ID: Krystal [name], female    DOB: 03-12-1941, 75 y.o.   MRN: [id number]  HPI Patient here today for Transitional Care Management appointment after hospitalization for pneumonia and flu.  Expand All Collapse All   Physician Discharge Summary  CHARDAI OVERCASH [id number] DOB: 1941-03-22 DOA: 10/29/2015  PCP: Redge Gainer, MD  Admit date: 10/29/2015 Discharge date: 11/03/2015  Time spent: 35 minutes  Recommendations for Outpatient Follow-up:  Needs CBC to follow leukopenia   Discharge Diagnoses:   CAP (community acquired pneumonia)  Anxiety state  Asthma  PSVT (paroxysmal supraventricular tachycardia) ([place])  Influenza with pneumonia   Discharge Condition: uimproved       Patient was hospitalized from [phone] flu and possible pneumonia. She has finished her course of treatment in both Tamiflu and oral antibiotic. She is staying with her brother rather than at her house or with her 61 year old mother. As expected, she feels weak and still has some cough and wheeze.     Patient Active Problem List   Diagnosis Date Noted  . Influenza with pneumonia 10/30/2015  . CAP (community acquired pneumonia) 10/29/2015  . Abdominal aortic atherosclerosis ([place]) 04/10/2015  . PSVT (paroxysmal supraventricular tachycardia) ([place]) 05/10/2014  . Macular degeneration, age related, nonexudative 08/24/2013  . History of right mastectomy 07/13/2013  . Anemia, iron deficiency 06/21/2013  . Allergic rhinitis 06/21/2013  . Chronic low back pain 06/21/2013  . NEOPLASM, MALIGNANT, BREAST 11/22/2007  . Mixed hyperlipidemia 11/22/2007  . Anxiety state 11/22/2007  . DEPRESSION 11/22/2007  . Asthma 11/22/2007  . GERD 11/22/2007  . GASTRITIS, WITH HEMORRHAGE 08/14/2007  . DUODENITIS, WITHOUT HEMORRHAGE 08/14/2007  . HIATAL HERNIA 08/14/2007  . 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   Outpatient Encounter Prescriptions as of 11/07/2015  Medication Sig  .  aspirin EC 81 MG tablet Take 81 mg by mouth at bedtime.  . cetirizine (ZYRTEC) 10 MG tablet Take 10 mg by mouth at bedtime.   . Cholecalciferol (VITAMIN D) 2000 UNITS CAPS Take 1 capsule by mouth at bedtime.  Marland Kitchen dextromethorphan-guaiFENesin (MUCINEX DM) 30-600 MG 12hr tablet Take 1 tablet by mouth 2 (two) times daily.  . diazepam (VALIUM) 5 MG tablet TAKE ONE TABLET BY MOUTH EVERY SIX HOURS AS NEEDED  . diltiazem (CARDIZEM CD) 240 MG 24 hr capsule TAKE ONE CAPSULE BY MOUTH ONE TIME DAILY  . ferrous sulfate 325 (65 FE) MG tablet Take 325 mg by mouth at bedtime.  . montelukast (SINGULAIR) 10 MG tablet TAKE 1 TABLET (10 MG TOTAL) BY MOUTH AT BEDTIME.  . Multiple Vitamins-Minerals (PRESERVISION/LUTEIN PO) Take 1 capsule by mouth at bedtime.   Marland Kitchen NEXIUM 40 MG capsule TAKE 1 CAPSULE (40 MG TOTAL)  BY MOUTH DAILY.  Marland Kitchen PROAIR HFA 108 (90 Base) MCG/ACT inhaler INHALE TWO PUFFS BY MOUTH 4 TIMES DAILY AS NEEDED  . simvastatin (ZOCOR) 10 MG tablet ONE TABLET DAILY  . traMADol (ULTRAM) 50 MG tablet Take 1 tablet (50 mg total) by mouth every 12 (twelve) hours as needed for moderate pain.  . nitroGLYCERIN (NITROLINGUAL) 0.4 MG/SPRAY spray Use one spray under tongue as needed for chest pain. May repeat dose in 5 minutes. If chest pain not relieved than call 911. (Patient not taking: Reported on 11/07/2015)  . [DISCONTINUED] cephALEXin (KEFLEX) 500 MG capsule Take 1 capsule (500 mg total) by mouth 2 (two) times daily. (Patient not taking: Reported on 11/07/2015)  . [DISCONTINUED] oseltamivir (TAMIFLU) 30 MG capsule Take 1 capsule (30 mg total) by  mouth 2 (two) times daily.   No facility-administered encounter medications on file as of 11/07/2015.     Review of Systems  HENT: Positive for congestion.   Respiratory: Positive for cough and shortness of breath.   Cardiovascular: Negative.   Gastrointestinal: Negative.   Endocrine: Negative.   Genitourinary: Negative.   Musculoskeletal: Negative.   Skin: Negative.     Allergic/Immunologic: Negative.   Neurological: Negative.   Hematological: Negative.   Psychiatric/Behavioral: Negative.          Objective:   Physical Exam  Constitutional: She is oriented to person, place, and time. She appears well-developed and well-nourished.  Cardiovascular: Normal rate and regular rhythm.   Pulmonary/Chest: Effort normal. She has wheezes.  Neurological: She is alert and oriented to person, place, and time.  She walks holding onto the walls.   BP 96/56 mmHg  Pulse 85  Temp(Src) 97.4 F (36.3 C) (Oral)  Ht 5\' 4"  (1.626 m)  Wt 147 lb 6.4 oz (66.86 kg)  BMI 25.29 kg/m2  SpO2 96%        Assessment & Plan:  1. Influenza with pneumonia Patient still feeling the effects of her infection. Need to check CBC today as she was leukopenic in the hospital. I have suggested she continue with Mucinex. She also has a albuterol inhaler which might help her wheeze and cough as well.  Wardell Honour MD - CBC with Differential/Platelet

## 2015-11-09 NOTE — Progress Notes (Signed)
Patient aware.

## 2015-11-12 ENCOUNTER — Ambulatory Visit: Payer: Medicare Other | Admitting: Family Medicine

## 2015-11-22 ENCOUNTER — Ambulatory Visit (INDEPENDENT_AMBULATORY_CARE_PROVIDER_SITE_OTHER): Payer: Medicare Other

## 2015-11-22 ENCOUNTER — Ambulatory Visit: Payer: Medicare Other

## 2015-11-22 ENCOUNTER — Ambulatory Visit (INDEPENDENT_AMBULATORY_CARE_PROVIDER_SITE_OTHER): Payer: Medicare Other | Admitting: Family Medicine

## 2015-11-22 ENCOUNTER — Encounter: Payer: Self-pay | Admitting: Family Medicine

## 2015-11-22 VITALS — BP 101/68 | HR 82 | Temp 97.4°F | Ht 64.0 in | Wt 147.0 lb

## 2015-11-22 DIAGNOSIS — Z780 Asymptomatic menopausal state: Secondary | ICD-10-CM

## 2015-11-22 DIAGNOSIS — M545 Low back pain: Secondary | ICD-10-CM | POA: Diagnosis not present

## 2015-11-22 DIAGNOSIS — Z13820 Encounter for screening for osteoporosis: Secondary | ICD-10-CM | POA: Diagnosis not present

## 2015-11-22 DIAGNOSIS — J4520 Mild intermittent asthma, uncomplicated: Secondary | ICD-10-CM | POA: Diagnosis not present

## 2015-11-22 DIAGNOSIS — C50919 Malignant neoplasm of unspecified site of unspecified female breast: Secondary | ICD-10-CM

## 2015-11-22 DIAGNOSIS — E782 Mixed hyperlipidemia: Secondary | ICD-10-CM | POA: Diagnosis not present

## 2015-11-22 DIAGNOSIS — E559 Vitamin D deficiency, unspecified: Secondary | ICD-10-CM

## 2015-11-22 DIAGNOSIS — I700 Atherosclerosis of aorta: Secondary | ICD-10-CM | POA: Diagnosis not present

## 2015-11-22 DIAGNOSIS — I10 Essential (primary) hypertension: Secondary | ICD-10-CM | POA: Diagnosis not present

## 2015-11-22 DIAGNOSIS — D509 Iron deficiency anemia, unspecified: Secondary | ICD-10-CM | POA: Diagnosis not present

## 2015-11-22 DIAGNOSIS — J189 Pneumonia, unspecified organism: Secondary | ICD-10-CM

## 2015-11-22 DIAGNOSIS — Z1211 Encounter for screening for malignant neoplasm of colon: Secondary | ICD-10-CM

## 2015-11-22 DIAGNOSIS — M549 Dorsalgia, unspecified: Secondary | ICD-10-CM | POA: Diagnosis not present

## 2015-11-22 DIAGNOSIS — I471 Supraventricular tachycardia: Secondary | ICD-10-CM

## 2015-11-22 DIAGNOSIS — E785 Hyperlipidemia, unspecified: Secondary | ICD-10-CM | POA: Diagnosis not present

## 2015-11-22 MED ORDER — ESOMEPRAZOLE MAGNESIUM 40 MG PO CPDR: DELAYED_RELEASE_CAPSULE | ORAL | Status: DC

## 2015-11-22 NOTE — Patient Instructions (Addendum)
Medicare Annual Wellness Visit  [facility] and the medical providers at [facility] strive to bring you the best medical care.  In doing so we not only want to address your current medical conditions and concerns but also to detect new conditions early and prevent illness, disease and health-related problems.    Medicare offers a yearly Wellness Visit which allows our clinical staff to assess your need for preventative services including immunizations, lifestyle education, counseling to decrease risk of preventable diseases and screening for fall risk and other medical concerns.    This visit is provided free of charge (no copay) for all Medicare recipients. The clinical pharmacists at [facility] have begun to conduct these Wellness Visits which will also include a thorough review of all your medications.    As you primary medical provider recommend that you make an appointment for your Annual Wellness Visit if you have not done so already this year.  You may set up this appointment before you leave today or you may call back [id number]) and schedule an appointment.  Please make sure when you call that you mention that you are scheduling your Annual Wellness Visit with the clinical pharmacist so that the appointment may be made for the proper length of time.     Continue current medications. Continue good therapeutic lifestyle changes which include good diet and exercise. Fall precautions discussed with patient. If an FOBT was given today- please return it to our front desk. If you are over 20 years old - you may need Prevnar 51 or the adult Pneumonia vaccine.  **Flu shots are available--- please call and schedule a FLU-CLINIC appointment**  After your visit with [place] today you will receive a survey in the mail or online from [employer] regarding your care with [place]. Please take a moment to fill this out. Your feedback is very  important to [place] as you can help [place] better understand your patient needs as well as improve your experience and satisfaction. WE CARE ABOUT YOU!!!   Continue to be careful and do not put yourself at risk for falling We will call you with the results of the thoracic and lumbar spine films as soon as they become available. 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 We would ask you to come back and get a repeat chest x-ray for follow-up of your pneumonia and he will be given a time for this. Continue to drink plenty of fluids and use respiratory protection when out in the air because of all the pollen

## 2015-11-22 NOTE — Addendum Note (Signed)
Addended by: Hall Birchard H on: 11/22/2015 11:43 AM   Modules accepted: Orders  

## 2015-11-22 NOTE — Progress Notes (Signed)
Subjective:    Patient ID: Krystal [name], female    DOB: [birth year], 75 y.o.   MRN: [id number]  HPI  Pt here for follow up and management of chronic medical problems which includes hyperlipidemia and hypertension. She is taking medications regularly.The patient was recently in the hospital with pneumonia. She continues to complain of some left thigh pain which is sharp in nature and continues to complain of back pain. She is due to get her mammogram and a DEXA scan. She will also be given an FOBT to return and will get lab work today. She is requesting a refill on her Nexium. The patient is in good spirits. She seems to be okay with the fact that her mother has to be in a nursing home now and that she has a tracheostomy which requires more intensive care than they can give her at home. The patient denies any chest pain or shortness of breath. She has no trouble with heartburn indigestion nausea vomiting diarrhea or blood in the stool. She does have dark bowel movements secondary to take and iron. She is passing her water without problems. She is due to get her mammogram in May. We will schedule her for this. We will also go ahead and get some x-rays of her back since she does complain of left lateral thigh pain.     Patient Active Problem List   Diagnosis Date Noted  . Influenza with pneumonia 10/30/2015  . CAP (community acquired pneumonia) 10/29/2015  . Abdominal aortic atherosclerosis ([place]) 04/10/2015  . PSVT (paroxysmal supraventricular tachycardia) ([place]) 05/10/2014  . Macular degeneration, age related, nonexudative 08/24/2013  . History of right mastectomy 07/13/2013  . Anemia, iron deficiency 06/21/2013  . Allergic rhinitis 06/21/2013  . Chronic low back pain 06/21/2013  . NEOPLASM, MALIGNANT, BREAST 11/22/2007  . Mixed hyperlipidemia 11/22/2007  . Anxiety state 11/22/2007  . DEPRESSION 11/22/2007  . Asthma 11/22/2007  . GERD 11/22/2007  . GASTRITIS, WITH HEMORRHAGE  08/14/2007  . DUODENITIS, WITHOUT HEMORRHAGE 08/14/2007  . HIATAL HERNIA 08/14/2007  . 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   Outpatient Encounter Prescriptions as of 11/22/2015  Medication Sig  . aspirin EC 81 MG tablet Take 81 mg by mouth at bedtime.  . cetirizine (ZYRTEC) 10 MG tablet Take 10 mg by mouth at bedtime.   . Cholecalciferol (VITAMIN D) 2000 UNITS CAPS Take 1 capsule by mouth at bedtime.  Marland Kitchen dextromethorphan-guaiFENesin (MUCINEX DM) 30-600 MG 12hr tablet Take 1 tablet by mouth 2 (two) times daily.  . diazepam (VALIUM) 5 MG tablet TAKE ONE TABLET BY MOUTH EVERY SIX HOURS AS NEEDED  . diltiazem (CARDIZEM CD) 240 MG 24 hr capsule TAKE ONE CAPSULE BY MOUTH ONE TIME DAILY  . esomeprazole (NEXIUM) 40 MG capsule TAKE 1 CAPSULE (40 MG TOTAL)  BY MOUTH DAILY.  . ferrous sulfate 325 (65 FE) MG tablet Take 325 mg by mouth at bedtime.  . montelukast (SINGULAIR) 10 MG tablet TAKE 1 TABLET (10 MG TOTAL) BY MOUTH AT BEDTIME.  . Multiple Vitamins-Minerals (PRESERVISION/LUTEIN PO) Take 1 capsule by mouth at bedtime.   . nitroGLYCERIN (NITROLINGUAL) 0.4 MG/SPRAY spray Use one spray under tongue as needed for chest pain. May repeat dose in 5 minutes. If chest pain not relieved than call 911.  Marland Kitchen PROAIR HFA 108 (90 Base) MCG/ACT inhaler INHALE TWO PUFFS BY MOUTH 4 TIMES DAILY AS NEEDED  . simvastatin (ZOCOR) 10 MG tablet ONE TABLET DAILY  . traMADol (ULTRAM) 50 MG tablet Take 1 tablet (50  mg total) by mouth every 12 (twelve) hours as needed for moderate pain.  . [DISCONTINUED] NEXIUM 40 MG capsule TAKE 1 CAPSULE (40 MG TOTAL)  BY MOUTH DAILY.   No facility-administered encounter medications on file as of 11/22/2015.     Review of Systems  Constitutional: Negative.   HENT: Negative.   Eyes: Negative.   Respiratory: Negative.   Cardiovascular: Negative.   Gastrointestinal: Negative.   Endocrine: Negative.   Genitourinary: Negative.   Musculoskeletal: Positive for back pain and arthralgias  (left thigh).  Skin: Negative.   Allergic/Immunologic: Negative.   Neurological: Negative.   Hematological: Negative.   Psychiatric/Behavioral: Negative.        Objective:   Physical Exam  Constitutional: She is oriented to person, place, and time. She appears well-developed and well-nourished. No distress.  The patient is pleasant and alert and seems to be more rested than in the past since her mom is in the nursing home.  HENT:  Head: Normocephalic and atraumatic.  Right Ear: External ear normal.  Left Ear: External ear normal.  Nose: Nose normal.  Mouth/Throat: Oropharynx is clear and moist. No oropharyngeal exudate.  Eyes: Conjunctivae and EOM are normal. Pupils are equal, round, and reactive to light. Right eye exhibits no discharge. Left eye exhibits no discharge. No scleral icterus.  Neck: Normal range of motion. Neck supple. No thyromegaly present.  Cardiovascular: Normal rate, regular rhythm, normal heart sounds and intact distal pulses.   No murmur heard. Heart has a regular rate and rhythm at 84/m  Pulmonary/Chest: Effort normal and breath sounds normal. No respiratory distress. She has no wheezes. She has no rales.  Abdominal: Soft. Bowel sounds are normal. She exhibits no mass. There is tenderness. There is no rebound and no guarding.  Slight right upper quadrant tenderness but otherwise no liver or spleen enlargement and no organ enlargement.  Genitourinary:  The patient has had a right mastectomy. The left breast was examined today and no masses were palpable and the axillary region was clear.  Musculoskeletal: Normal range of motion. She exhibits no edema or tenderness.  The patient has a kyphotic posture. 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 She is due to get a DEXA scan and this will be done today.  Lymphadenopathy:    She has no cervical adenopathy.  Neurological: She is alert and oriented to person, place, and time. She has normal reflexes. No cranial nerve  deficit.  Lower extremity reflexes are slightly diminished bilaterally.  Skin: Skin is warm and dry. No rash noted.  Psychiatric: She has a normal mood and affect. Her behavior is normal. Judgment and thought content normal.  Nursing note and vitals reviewed.  BP 101/68 mmHg  Pulse 82  Temp(Src) 97.4 F (36.3 C) (Oral)  Ht 5' 4" (1.626 m)  Wt 147 lb (66.679 kg)  BMI 25.22 kg/m2  [facility] reading (PRIMARY) by  Dr. Glendon Axe and lumbar spine films with results pending  DEXA scan will also be done today with results pending                                        Assessment & Plan:  1. Hyperlipidemia -Continue current treatment pending results of lab work, continue with aggressive therapeutic lifestyle changes which include diet and exercise - CBC with Differential/Platelet - NMR, lipoprofile  2. Essential hypertension -The blood pressure is good today and she will continue  to watch the sodium in her diet. She will also continue with her current medications. - BMP8+EGFR - CBC with Differential/Platelet - Hepatic function panel  3. Vitamin D deficiency -Continue vitamin D replacement pending results of lab work - CBC with Differential/Platelet - VITAMIN D 25 Hydroxy (Vit-D Deficiency, Fractures) - DG Bone Density; Future  4. Anemia, iron deficiency -Continue iron replacement pending results of the CBC - CBC with Differential/Platelet  5. Screening for osteoporosis -DEXA scan today. She should continue with calcium replacement and vitamin D and weightbearing exercise - DG Bone Density; Future  6. Postmenopausal - DG Bone Density; Future  7. Special screening for malignant neoplasms, colon -Return the FOBT - Fecal occult blood, imunochemical; Future  8. CAP (community acquired pneumonia) -The breast sounds were good today and she will return to the office in about 3 weeks which will be one month after the previous chest x-ray for follow-up chest x-ray  9. Abdominal  aortic atherosclerosis ([place]) -Continue with Wynetta Emery all medicine and aggressive therapeutic lifestyle changes  10. PSVT (paroxysmal supraventricular tachycardia) ([facility]) -No symptoms with this today and the heart had a regular rate and rhythm  11. Asthma, mild intermittent, uncomplicated -The breathing appears stable  12. Mixed hyperlipidemia -Continue with aggressive therapeutic lifestyle changes and simvastatin  13. 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 Low back pain, unspecified back pain laterality, with sciatica presence unspecified -LS spine films - DG Lumbar Spine 2-3 Views; Future  Meds ordered this encounter  Medications  . esomeprazole (NEXIUM) 40 MG capsule    Sig: TAKE 1 CAPSULE (40 MG TOTAL)  BY MOUTH DAILY.    Dispense:  90 capsule    Refill:  1   Patient Instructions                       Medicare Annual Wellness Visit  [facility] and the medical providers at [facility] strive to bring you the best medical care.  In doing so we not only want to address your current medical conditions and concerns but also to detect new conditions early and prevent illness, disease and health-related problems.    Medicare offers a yearly Wellness Visit which allows our clinical staff to assess your need for preventative services including immunizations, lifestyle education, counseling to decrease risk of preventable diseases and screening for fall risk and other medical concerns.    This visit is provided free of charge (no copay) for all Medicare recipients. The clinical pharmacists at [facility] have begun to conduct these Wellness Visits which will also include a thorough  review of all your medications.    As you primary medical provider recommend that you make an appointment for your Annual Wellness Visit if you have not done so already this year.  You may set up this appointment before you leave today or you may call back ([phone]) and schedule an appointment.  Please make sure when you call that you mention that you are scheduling your Annual Wellness Visit with the clinical pharmacist so that the appointment may be made for the proper length of time.     Continue current medications. Continue good therapeutic lifestyle changes which include good  diet and exercise. Fall precautions discussed with patient. If an FOBT was given today- please return it to our front desk. If you are over 79 years old - you may need Prevnar 42 or the adult Pneumonia vaccine.  **Flu shots are available--- please call and schedule a FLU-CLINIC appointment**  After your visit with [place] today you will receive a survey in the mail or online from [employer] regarding your care with [place]. Please take a moment to fill this out. Your feedback is very important to [place] as you can help [place] better understand your patient needs as well as improve your experience and satisfaction. WE CARE ABOUT YOU!!!   Continue to be careful and do not put yourself at risk for falling We will call you with the results of the thoracic and lumbar spine films as soon as they become available. 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 We would ask you to come back and get a repeat chest x-ray for follow-up of your pneumonia and he will be given a time for this. Continue to drink plenty of fluids and use respiratory protection when out in the air because of all the pollen    Arrie Senate MD

## 2015-11-23 LAB — BMP8+EGFR
BUN/Creatinine Ratio: 28 (ref 12–28)
BUN: 21 mg/dL (ref 8–27)
CALCIUM: 9.7 mg/dL (ref 8.7–10.3)
CHLORIDE: 100 mmol/L (ref 96–106)
CO2: 25 mmol/L (ref 18–29)
Creatinine, Ser: 0.75 mg/dL (ref 0.57–1.00)
GFR calc Af Amer: 90 mL/min/{1.73_m2} (ref >59)
GFR calc non Af Amer: 78 mL/min/{1.73_m2} (ref >59)
Glucose: 94 mg/dL (ref 65–99)
POTASSIUM: 4.5 mmol/L (ref 3.5–5.2)
Sodium: 140 mmol/L (ref 134–144)

## 2015-11-23 LAB — HEPATIC FUNCTION PANEL
ALBUMIN: 4.2 g/dL (ref 3.5–4.8)
ALK PHOS: 49 IU/L (ref 39–117)
ALT: 9 IU/L (ref 0–32)
AST: 20 IU/L (ref 0–40)
BILIRUBIN TOTAL: 0.3 mg/dL (ref 0.0–1.2)
BILIRUBIN, DIRECT: 0.11 mg/dL (ref 0.00–0.40)
TOTAL PROTEIN: 6.4 g/dL (ref 6.0–8.5)

## 2015-11-23 LAB — CBC WITH DIFFERENTIAL/PLATELET
BASOS ABS: 0 10*3/uL (ref 0.0–0.2)
Basos: 1 %
EOS (ABSOLUTE): 0.2 10*3/uL (ref 0.0–0.4)
Eos: 3 %
HEMOGLOBIN: 12.5 g/dL (ref 11.1–15.9)
Hematocrit: 38.9 % (ref 34.0–46.6)
IMMATURE GRANS (ABS): 0 10*3/uL (ref 0.0–0.1)
IMMATURE GRANULOCYTES: 0 %
LYMPHS: 30 %
Lymphocytes Absolute: 1.9 10*3/uL (ref 0.7–3.1)
MCH: 28.9 pg (ref 26.6–33.0)
MCHC: 32.1 g/dL (ref 31.5–35.7)
MCV: 90 fL (ref 79–97)
MONOCYTES: 7 %
Monocytes Absolute: 0.4 10*3/uL (ref 0.1–0.9)
NEUTROS PCT: 59 %
Neutrophils Absolute: 3.8 10*3/uL (ref 1.4–7.0)
PLATELETS: 179 10*3/uL (ref 150–379)
RBC: 4.32 x10E6/uL (ref 3.77–5.28)
RDW: 13.5 % (ref 12.3–15.4)
WBC: 6.4 10*3/uL (ref 3.4–10.8)

## 2015-11-23 LAB — NMR, LIPOPROFILE
CHOLESTEROL: 157 mg/dL (ref 100–199)
HDL Cholesterol by NMR: 57 mg/dL (ref >39)
HDL PARTICLE NUMBER: 39.2 umol/L (ref >=30.5)
LDL PARTICLE NUMBER: 921 nmol/L (ref <1000)
LDL SIZE: 20.6 nm (ref >20.5)
LDL-C: 82 mg/dL (ref 0–99)
LP-IR Score: 27 (ref <=45)
Small LDL Particle Number: 375 nmol/L (ref <=527)
TRIGLYCERIDES BY NMR: 92 mg/dL (ref 0–149)

## 2015-11-23 LAB — VITAMIN D 25 HYDROXY (VIT D DEFICIENCY, FRACTURES): Vit D, 25-Hydroxy: 42.4 ng/mL (ref 30.0–100.0)

## 2015-11-26 ENCOUNTER — Other Ambulatory Visit: Payer: Self-pay | Admitting: Family Medicine

## 2015-11-27 NOTE — Telephone Encounter (Signed)
Diazepam last filled 08/20/2015

## 2015-12-10 ENCOUNTER — Encounter: Payer: Self-pay | Admitting: Pharmacist

## 2015-12-10 ENCOUNTER — Encounter (INDEPENDENT_AMBULATORY_CARE_PROVIDER_SITE_OTHER): Payer: Self-pay

## 2015-12-10 ENCOUNTER — Ambulatory Visit (INDEPENDENT_AMBULATORY_CARE_PROVIDER_SITE_OTHER): Payer: Medicare Other | Admitting: Pharmacist

## 2015-12-10 ENCOUNTER — Encounter: Payer: Medicare Other | Admitting: Pharmacist

## 2015-12-10 VITALS — BP 136/70 | HR 86 | Ht 64.0 in | Wt 154.0 lb

## 2015-12-10 DIAGNOSIS — Z1211 Encounter for screening for malignant neoplasm of colon: Secondary | ICD-10-CM

## 2015-12-10 DIAGNOSIS — Z0000 Encounter for general adult medical examination without abnormal findings: Secondary | ICD-10-CM | POA: Diagnosis not present

## 2015-12-10 MED ORDER — DENOSUMAB 60 MG/ML ~~LOC~~ SOLN: 60.0000 mg | Freq: Once | SUBCUTANEOUS | Status: DC

## 2015-12-10 MED ORDER — CALCIUM CITRATE-VITAMIN D 315-250 MG-UNIT PO TABS: 2.0000 | ORAL_TABLET | Freq: Every day | ORAL | Status: AC

## 2015-12-10 NOTE — Patient Instructions (Addendum)
Ms. Cisar , Thank you for taking time to come for your Medicare Wellness Visit. I appreciate your ongoing commitment to your health goals. Please review the following plan we discussed and let me know if I can assist you in the future.   These are the goals we discussed: Do Chair Exercises from handout daily I will look into insurance coverage for Prolia - injection for osteoporosis / bone that you get every 6 months.  Start calcium citrate 315mg  take 1 tablets daily  And continue to eat cheese and green leafty vegetables.    Increase non-starchy vegetables - carrots, green bean, squash, zucchini, tomatoes, onions, peppers, spinach and other green leafy vegetables, cabbage, lettuce, cucumbers, asparagus, okra (not fried), eggplant Limit sugar and processed foods (cakes, cookies, ice cream, crackers and chips) Increase fresh fruit but limit serving sizes 1/2 cup or about the size of tennis or baseball Limit red meat to no more than 1-2 times per week (serving size about the size of your palm) Choose whole grains / lean proteins - whole wheat bread, quinoa, whole grain rice (1/2 cup), fish, chicken, [place] Avoid sugar and calorie containing beverages - soda, sweet tea and juice.  Choose water or unsweetened tea instead.   This is a list of the screening recommended for you and due dates:  Health Maintenance  Topic Date Due  . Shingles Vaccine  04/05/2016*  . Tetanus Vaccine  06/23/2016*  . Pneumonia vaccines (2 of 2 - PPSV23) completed  . Flu Shot  03/04/2016  . Colon Cancer Screening  02/23/2017  . DEXA scan (bone density measurement)  11/21/2017  *Topic was postponed. The date shown is not the original due date.    Fall Prevention in the Home  Falls can cause injuries and can affect people from all age groups. There are many simple things that you can do to make your home safe and to help prevent falls. WHAT CAN I DO ON THE OUTSIDE OF MY HOME?  Regularly repair the edges of  walkways and driveways and fix any cracks.  Remove high doorway thresholds.  Trim any shrubbery on the main path into your home.  Use bright outdoor lighting.  Clear walkways of debris and clutter, including tools and rocks.  Regularly check that handrails are securely fastened and in good repair. Both sides of any steps should have handrails.  Install guardrails along the edges of any raised decks or porches.  Have leaves, snow, and ice cleared regularly.  Use sand or salt on walkways during winter months.  In the garage, clean up any spills right away, including grease or oil spills. WHAT CAN I DO IN THE BATHROOM?  Use night lights.  Install grab bars by the toilet and in the tub and shower. Do not use towel bars as grab bars.  Use non-skid mats or decals on the floor of the tub or shower.  If you need to sit down while you are in the shower, use a plastic, non-slip stool.Marland Kitchen  Keep the floor dry. Immediately clean up any water that spills on the floor.  Remove soap buildup in the tub or shower on a regular basis.  Attach bath mats securely with double-sided non-slip rug tape.  Remove throw rugs and other tripping hazards from the floor. WHAT CAN I DO IN THE BEDROOM?  Use night lights.  Make sure that a bedside light is easy to reach.  Do not use oversized bedding that drapes onto the floor.  Have a  firm chair that has side arms to use for getting dressed.  Remove throw rugs and other tripping hazards from the floor. WHAT CAN I DO IN THE KITCHEN?   Clean up any spills right away.  Avoid walking on wet floors.  Place frequently used items in easy-to-reach places.  If you need to reach for something above you, use a sturdy step stool that has a grab bar.  Keep electrical cables out of the way.  Do not use floor polish or wax that makes floors slippery. If you have to use wax, make sure that it is non-skid floor wax.  Remove throw rugs and other tripping  hazards from the floor. WHAT CAN I DO IN THE STAIRWAYS?  Do not leave any items on the stairs.  Make sure that there are handrails on both sides of the stairs. Fix handrails that are broken or loose. Make sure that handrails are as long as the stairways.  Check any carpeting to make sure that it is firmly attached to the stairs. Fix any carpet that is loose or worn.  Avoid having throw rugs at the top or bottom of stairways, or secure the rugs with carpet tape to prevent them from moving.  Make sure that you have a light switch at the top of the stairs and the bottom of the stairs. If you do not have them, have them installed. WHAT ARE SOME OTHER FALL PREVENTION TIPS?  Wear closed-toe shoes that fit well and support your feet. Wear shoes that have rubber soles or low heels.  When you use a stepladder, make sure that it is completely opened and that the sides are firmly locked. Have someone hold the ladder while you are using it. Do not climb a closed stepladder.  Add color or contrast paint or tape to grab bars and handrails in your home. Place contrasting color strips on the first and last steps.  Use mobility aids as needed, such as canes, walkers, scooters, and crutches.  Turn on lights if it is dark. Replace any light bulbs that burn out.  Set up furniture so that there are clear paths. Keep the furniture in the same spot.  Fix any uneven floor surfaces.  Choose a carpet design that does not hide the edge of steps of a stairway.  Be aware of any and all pets.  Review your medicines with your healthcare provider. Some medicines can cause dizziness or changes in blood pressure, which increase your risk of falling. Talk with your health care provider about other ways that you can decrease your risk of falls. This may include working with a physical therapist or trainer to improve your strength, balance, and endurance.   This information is not intended to replace advice given to  you by your health care provider. Make sure you discuss any questions you have with your health care provider.   Document Released: 07/11/2002 Document Revised: 12/05/2014 Document Reviewed: 08/25/2014 Elsevier Interactive Patient Education [employer].

## 2015-12-10 NOTE — Progress Notes (Signed)
Patient ID: Krystal [name], female   DOB: 1941-01-27, 75 y.o.   MRN: [id number]    Subjective:   Krystal [name] is a 75 y.o. female who presents for a subsequent Medicare Annual Wellness Visit and to discussed results of her DEXA / BMD from 11/22/2015 Krystal [name] is a widow for 17 years. She lives a lone but has family close by to assist her.  She does not drive due to decrease vision secondary to macular degeneration.   She has osteoporosis and has tried oral bisphosphonates in past but experienced esophagitis and tightness in her throat with alendronate and risendronate.  She took Evista about 4 years ago without problems but stopped due to cost. She has not had a fracture.   Review of Systems  Review of Systems  Constitutional: Negative.   HENT: Negative.   Eyes: Negative.        Decreased vision due to macular degeneration  Respiratory: Negative.   Cardiovascular: Negative.   Gastrointestinal: Positive for heartburn (take nexium for this with great results).  Genitourinary: Negative.   Musculoskeletal: Positive for falls (fell about 7 months ago ).  Skin: Negative.   Neurological: Positive for tingling (reports tingling in right hand from wrist to fingertips).  Endo/Heme/Allergies: Negative.   Psychiatric/Behavioral: Negative.      Current Medications (verified) Outpatient Encounter Prescriptions as of 12/10/2015  Medication Sig  . aspirin EC 81 MG tablet Take 81 mg by mouth at bedtime.  . cetirizine (ZYRTEC) 10 MG tablet Take 10 mg by mouth at bedtime.   . Cholecalciferol (VITAMIN D) 2000 UNITS CAPS Take 1 capsule by mouth at bedtime.  Marland Kitchen dextromethorphan-guaiFENesin (MUCINEX DM) 30-600 MG 12hr tablet Take 1 tablet by mouth 2 (two) times daily.  . diazepam (VALIUM) 5 MG tablet TAKE ONE TABLET BY MOUTH EVERY SIX HOURS AS NEEDED  . diltiazem (CARDIZEM CD) 240 MG 24 hr capsule TAKE ONE CAPSULE BY MOUTH ONE TIME DAILY  . esomeprazole (NEXIUM) 40 MG capsule TAKE 1 CAPSULE (40  MG TOTAL)  BY MOUTH DAILY.  . ferrous sulfate 325 (65 FE) MG tablet Take 325 mg by mouth at bedtime.  . montelukast (SINGULAIR) 10 MG tablet TAKE 1 TABLET (10 MG TOTAL) BY MOUTH AT BEDTIME.  . Multiple Vitamins-Minerals (PRESERVISION/LUTEIN PO) Take 1 capsule by mouth at bedtime.   . nitroGLYCERIN (NITROLINGUAL) 0.4 MG/SPRAY spray Use one spray under tongue as needed for chest pain. May repeat dose in 5 minutes. If chest pain not relieved than call 911.  Marland Kitchen PROAIR HFA 108 (90 Base) MCG/ACT inhaler INHALE TWO PUFFS BY MOUTH 4 TIMES DAILY AS NEEDED  . simvastatin (ZOCOR) 10 MG tablet TAKE ONE TABLET BY MOUTH ONE TIME DAILY  . traMADol (ULTRAM) 50 MG tablet Take 1 tablet (50 mg total) by mouth every 12 (twelve) hours as needed for moderate pain.   No facility-administered encounter medications on file as of 12/10/2015.    Allergies (verified) Actonel; Fosamax; Mevacor; Miacalcin; and Mobic   History: Past Medical History  Diagnosis Date  . COPD (chronic obstructive pulmonary disease) ([place])   . Hyperlipidemia   . Leg edema   . Asthma   . Right knee pain   . Breast cancer ([place])     Right breast  . Brain tumor ([place])     1986  . Colon polyps   . Hiatal hernia   . Cataract   . Macular degeneration   . PVD (posterior vitreous detachment)   . Anemia  Past Surgical History  Procedure Laterality Date  . Mastectomy Right   . Partial hysterectomy    . Craniectomy / craniotomy for excision of brain tumor      Left side brain  . Skin cancer excision      Under left eye  . Colonoscopy    . Polypectomy    . Abdominal hysterectomy    . Eye surgery     Family History  Problem Relation Age of Onset  . Heart disease Mother 4    Stroke   . Dementia Mother   . Congestive Heart Failure Mother   . Osteoporosis Mother   . Heart disease Sister   . Hypertension Sister   . Diabetes Sister   . Osteoporosis Sister   . Heart disease Brother   . Hyperlipidemia Brother   . Diabetes Brother     . Heart disease Sister   . Hypertension Sister   . Diabetes Sister   . Diabetes Sister   . Hypertension Sister   . Diabetes Sister   . Hypertension Sister   . Diabetes Brother   . Hyperlipidemia Brother   . Colon cancer Neg Hx   . Rectal cancer Neg Hx   . Stomach cancer Neg Hx   . Esophageal cancer Neg Hx   . Cancer Father     lung  . Heart attack Father   . Diabetes Brother   . Heart disease Brother   . Hyperlipidemia Brother   . Diabetes Brother   . Diabetes Son    Social History   Occupational History  . Not on file.   Social History Main Topics  . Smoking status: Former Smoker    Types: Cigarettes    Quit date: 02/09/1961  . Smokeless tobacco: Never Used  . Alcohol Use: No  . Drug Use: No  . Sexual Activity: No    Do you feel safe at home?  Yes Are there smokers in your home (other than you)? No  Dietary issues and exercise activities: Current Exercise Habits: Home exercise routine, Type of exercise: walking, Time (Minutes): 10, Frequency (Times/Week): 2, Weekly Exercise (Minutes/Week): 20, Intensity: Mild  Current Dietary habits:     Calcium Assessment Calcium Intake  # of servings/day  Calcium mg  Milk (8 oz) 0  x  300  = 0  Yogurt (4 oz) 0 x  200 = 0  Cheese (1 oz) 1 x  200 = 200mg   Other Calcium sources   250mg   Ca supplement 0 = 0   Estimated calcium intake per day 450mg      Objective:    Today's Vitals   12/10/15 1132  BP: 136/70  Pulse: 86  Height: 5\' 4"  (1.626 m)  Weight: 154 lb (69.854 kg)  PainSc: 2   PainLoc: Hand   Body mass index is 26.42 kg/(m^2).   DEXA Results Date of Test T-Score for AP Spine L1-L4 T-Score for Total Left Hip T-Score for Total Right Hip  11/22/2015 -2.6 -3.0 -2.8  07/13/2013 -2.5 -2.7 -2.4  06/04/2004 -2.2 -1.6 --  05/04/2001 -2.4 -2.0 --  04/16/1999 -2.2 -1.8 --    Activities of Daily Living In your present state of health, do you have any difficulty performing the following activities: 12/10/2015  10/30/2015  Hearing? N N  Vision? Y N  Difficulty concentrating or making decisions? N N  Walking or climbing stairs? Y N  Dressing or bathing? N N  Doing errands, shopping? Y N  Preparing Food and eating ?  N -  Using the Toilet? N -  In the past six months, have you accidently leaked urine? N -  Do you have problems with loss of bowel control? N -  Managing your Medications? N -  Managing your Finances? N -  Housekeeping or managing your Housekeeping? N -     Cardiac Risk Factors include: advanced age (>63men, >49 women);dyslipidemia;family history of premature cardiovascular disease;hypertension;sedentary lifestyle  Depression Screen PHQ 2/9 Scores 12/10/2015 11/22/2015 11/07/2015 04/06/2015  PHQ - 2 Score 0 0 0 0     Fall Risk Fall Risk  12/10/2015 11/22/2015 11/07/2015 04/06/2015 11/27/2014  Falls in the past year? Yes No No No Yes  Number falls in past yr: 1 - - - 2 or more  Injury with Fall? - - - - Yes  Risk Factor Category  - - - - -  Risk for fall due to : History of fall(s);Impaired balance/gait - Impaired balance/gait - -  Risk for fall due to (comments): - - - - -  Follow up - - - - -    Cognitive Function: MMSE - Mini Mental State Exam 12/10/2015 10/18/2014  Orientation to time 5 5  Orientation to Place 5 5  Registration 3 3  Attention/ Calculation 5 5  Recall 3 3  Language- name 2 objects 2 2  Language- repeat 1 1  Language- follow 3 step command 1 3  Language- read & follow direction 1 1  Write a sentence 1 1  Copy design 1 1  Total score 28 30    Immunizations and Health Maintenance Immunization History  Administered Date(s) Administered  . Pneumococcal Conjugate-13 06/21/2013   There are no preventive care reminders to display for this patient.  Patient Care Team: Chipper Herb, MD as PCP - General (Family Medicine) Milus Banister, MD (Gastroenterology) Deeann Saint, MD as Consulting Physician (Hematology and Oncology) Sherlynn Stalls, MD as Consulting  Physician (Ophthalmology) Satira Sark, MD as Consulting Physician (Cardiology) Okey Regal, OD as Consulting Physician (Optometry)  Indicate any recent Medical Services you may have received from other than [facility] providers in the past year (date may be approximate).    Assessment:    Annual Wellness Visit  Osteoporosis - worsening BMD   Screening Tests Health Maintenance  Topic Date Due  . ZOSTAVAX  04/05/2016 (Originally 04/27/1999)  . TETANUS/TDAP  06/23/2016 (Originally 11/03/2015)  . PNA vac Low Risk Adult (2 of 2 - PPSV23) 06/23/2016 (Originally 06/21/2014)  . INFLUENZA VACCINE  03/04/2016  . COLONOSCOPY  02/23/2017  . DEXA SCAN  11/21/2017        Plan:   During the course of the visit [place] was educated and counseled about the following appropriate screening and preventive services:   Vaccines to include Pneumoccal, Influenza, Td, Zostavax - patient is due Zostavax but she has had shingles and declines; also due Tdap - declined.   Colorectal cancer screening - UTD  Cardiovascular disease screening - EKG / ECHO are UTD - sees Cardiologist q6 months  Diabetes screening - UTD  Bone Denisty / Osteoporosis Screening - UTD; in need of treatment for osteoporosis.  Discussed options - patient would like to try Prolia - verifying insurance coverage.    Increase calcium in take in diet and add calcium citrate 315mg  2 tablets daily  Continue vitamin d 1000IU daily  Mammogram - UTD  PAP - no longer required  Glaucoma screening /  Eye Exam- UTD  Nutrition counseling - discussed increasing  vegetables, fruits and whole grains.  Limit high fat foods.  Advanced Directives - information given  Physical Activity - gave chair exercise handouts to try at home  Gave medic alert information  Fall prevention discussed.   Refer back to PCP for evaluation of hand tingling - probably needs xray / scans.     Patient Instructions (the written plan) were given to the  patient.   Cherre Robins, [facility]   12/10/2015

## 2015-12-12 ENCOUNTER — Other Ambulatory Visit: Payer: Self-pay | Admitting: *Deleted

## 2015-12-12 ENCOUNTER — Telehealth: Payer: Self-pay | Admitting: Pharmacist

## 2015-12-12 DIAGNOSIS — K921 Melena: Secondary | ICD-10-CM

## 2015-12-12 LAB — FECAL OCCULT BLOOD, IMMUNOCHEMICAL: Fecal Occult Bld: POSITIVE — AB

## 2015-12-12 NOTE — Telephone Encounter (Signed)
Patient was called by nursing pool re: labs - will forward

## 2015-12-12 NOTE — Telephone Encounter (Signed)
Patient aware of lab results.

## 2015-12-17 ENCOUNTER — Telehealth: Payer: Self-pay | Admitting: Family Medicine

## 2015-12-17 NOTE — Telephone Encounter (Signed)
Patient called about life alert but cost was about $29/month and this was more than the patient wanted to spend.  The representative with lifealert advised her to call her physician's office indicating there might be something we could do to get a cheaper cost for her. She also mentions that she really wants to [place].  I told her I would look into it to see what our office might be able to help with

## 2015-12-19 ENCOUNTER — Telehealth: Payer: Self-pay | Admitting: Pharmacist

## 2015-12-19 NOTE — Telephone Encounter (Signed)
Patient calling again about Life Alert - I told her to give office 1 week to find out more information about her getting assistance with cost.

## 2015-12-28 ENCOUNTER — Telehealth: Payer: Self-pay | Admitting: Pharmacist

## 2015-12-28 NOTE — Telephone Encounter (Signed)
Spoke with patient about life alert.  Will mail her some information about getting help paying for this service.

## 2016-01-02 ENCOUNTER — Ambulatory Visit (INDEPENDENT_AMBULATORY_CARE_PROVIDER_SITE_OTHER): Payer: Medicare Other | Admitting: Pharmacist

## 2016-01-02 DIAGNOSIS — K921 Melena: Secondary | ICD-10-CM | POA: Diagnosis not present

## 2016-01-02 DIAGNOSIS — M810 Age-related osteoporosis without current pathological fracture: Secondary | ICD-10-CM | POA: Diagnosis not present

## 2016-01-02 MED ORDER — DENOSUMAB 60 MG/ML ~~LOC~~ SOLN
60.0000 mg | Freq: Once | SUBCUTANEOUS | Status: AC
  Administered 2016-01-02: 60 mg via SUBCUTANEOUS

## 2016-01-02 NOTE — Patient Instructions (Signed)
Denosumab injection  What is this medicine?  DENOSUMAB (den oh sue mab) slows bone breakdown. Prolia is used to treat osteoporosis in women after menopause and in men. Xgeva is used to prevent bone fractures and other bone problems caused by cancer bone metastases. Xgeva is also used to treat giant cell tumor of the bone.  This medicine may be used for other purposes; ask your health care provider or pharmacist if you have questions.  What should I tell my health care provider before I take this medicine?  They need to know if you have any of these conditions:  -dental disease  -eczema  -infection or history of infections  -kidney disease or on dialysis  -low blood calcium or vitamin D  -malabsorption syndrome  -scheduled to have surgery or tooth extraction  -taking medicine that contains denosumab  -thyroid or parathyroid disease  -an unusual reaction to denosumab, other medicines, foods, dyes, or preservatives  -pregnant or trying to get pregnant  -breast-feeding  How should I use this medicine?  This medicine is for injection under the skin. It is given by a health care professional in a hospital or clinic setting.  If you are getting Prolia, a special MedGuide will be given to you by the pharmacist with each prescription and refill. Be sure to read this information carefully each time.  For Prolia, talk to your pediatrician regarding the use of this medicine in children. Special care may be needed. For Xgeva, talk to your pediatrician regarding the use of this medicine in children. While this drug may be prescribed for children as young as 13 years for selected conditions, precautions do apply.  Overdosage: If you think you have taken too much of this medicine contact a poison control center or emergency room at once.  NOTE: This medicine is only for you. Do not share this medicine with others.  What if I miss a dose?  It is important not to miss your dose. Call your doctor or health care professional if you are  unable to keep an appointment.  What may interact with this medicine?  Do not take this medicine with any of the following medications:  -other medicines containing denosumab  This medicine may also interact with the following medications:  -medicines that suppress the immune system  -medicines that treat cancer  -steroid medicines like prednisone or cortisone  This list may not describe all possible interactions. Give your health care provider a list of all the medicines, herbs, non-prescription drugs, or dietary supplements you use. Also tell them if you smoke, drink alcohol, or use illegal drugs. Some items may interact with your medicine.  What should I watch for while using this medicine?  Visit your doctor or health care professional for regular checks on your progress. Your doctor or health care professional may order blood tests and other tests to see how you are doing.  Call your doctor or health care professional if you get a cold or other infection while receiving this medicine. Do not treat yourself. This medicine may decrease your body's ability to fight infection.  You should make sure you get enough calcium and vitamin D while you are taking this medicine, unless your doctor tells you not to. Discuss the foods you eat and the vitamins you take with your health care professional.  See your dentist regularly. Brush and floss your teeth as directed. Before you have any dental work done, tell your dentist you are receiving this medicine.  Do   not become pregnant while taking this medicine or for 5 months after stopping it. Women should inform their doctor if they wish to become pregnant or think they might be pregnant. There is a potential for serious side effects to an unborn child. Talk to your health care professional or pharmacist for more information.  What side effects may I notice from receiving this medicine?  Side effects that you should report to your doctor or health care professional as soon as  possible:  -allergic reactions like skin rash, itching or hives, swelling of the face, lips, or tongue  -breathing problems  -chest pain  -fast, irregular heartbeat  -feeling faint or lightheaded, falls  -fever, chills, or any other sign of infection  -muscle spasms, tightening, or twitches  -numbness or tingling  -skin blisters or bumps, or is dry, peels, or red  -slow healing or unexplained pain in the mouth or jaw  -unusual bleeding or bruising  Side effects that usually do not require medical attention (Report these to your doctor or health care professional if they continue or are bothersome.):  -muscle pain  -stomach upset, gas  This list may not describe all possible side effects. Call your doctor for medical advice about side effects. You may report side effects to FDA at 1-800-FDA-1088.  Where should I keep my medicine?  This medicine is only given in a clinic, doctor's office, or other health care setting and will not be stored at home.  NOTE: This sheet is a summary. It may not cover all possible information. If you have questions about this medicine, talk to your doctor, pharmacist, or health care provider.      2016, Elsevier/Gold Standard. (2012-01-19 12:37:47)

## 2016-01-02 NOTE — Progress Notes (Signed)
Patient ID: Krystal [name], female   DOB: July 31, 1941, 75 y.o.   MRN: [id number]    Subjective:   Krystal [name] is a 75 y.o. female who presents her first Prolia injection for ostoeporosis/ She has tried oral bisphosphonates in past but experienced esophagitis and tightness in her throat with alendronate and risendronate.  She took Evista about 4 years ago without problems but stopped due to cost. She has not had a fracture.   Review of Systems  Review of Systems  Constitutional: Negative.   HENT: Negative.   Eyes: Negative.        Decreased vision due to macular degeneration  Respiratory: Negative.   Cardiovascular: Negative.   Gastrointestinal: Positive for heartburn (take nexium for this with great results).  Genitourinary: Negative.   Musculoskeletal: Positive for falls (fell about 7 months ago ).  Skin: Negative.   Endo/Heme/Allergies: Negative.   Psychiatric/Behavioral: Negative.      Current Medications (verified) Outpatient Encounter Prescriptions as of 01/02/2016  Medication Sig  . aspirin EC 81 MG tablet Take 81 mg by mouth at bedtime.  . Calcium Citrate-Vitamin D (CALCIUM CITRATE + D) 315-250 MG-UNIT TABS Take 2 tablets by mouth daily.  . cetirizine (ZYRTEC) 10 MG tablet Take 10 mg by mouth at bedtime.   . Cholecalciferol (VITAMIN D) 2000 UNITS CAPS Take 1 capsule by mouth at bedtime.  Marland Kitchen denosumab (PROLIA) 60 MG/ML SOLN injection Inject 60 mg into the skin once. Administer in upper arm, thigh, or abdomen  . dextromethorphan-guaiFENesin (MUCINEX DM) 30-600 MG 12hr tablet Take 1 tablet by mouth 2 (two) times daily.  . diazepam (VALIUM) 5 MG tablet TAKE ONE TABLET BY MOUTH EVERY SIX HOURS AS NEEDED  . diltiazem (CARDIZEM CD) 240 MG 24 hr capsule TAKE ONE CAPSULE BY MOUTH ONE TIME DAILY  . esomeprazole (NEXIUM) 40 MG capsule TAKE 1 CAPSULE (40 MG TOTAL)  BY MOUTH DAILY.  . ferrous sulfate 325 (65 FE) MG tablet Take 325 mg by mouth at bedtime.  . montelukast  (SINGULAIR) 10 MG tablet TAKE 1 TABLET (10 MG TOTAL) BY MOUTH AT BEDTIME.  . Multiple Vitamins-Minerals (PRESERVISION/LUTEIN PO) Take 1 capsule by mouth at bedtime.   . nitroGLYCERIN (NITROLINGUAL) 0.4 MG/SPRAY spray Use one spray under tongue as needed for chest pain. May repeat dose in 5 minutes. If chest pain not relieved than call 911.  Marland Kitchen PROAIR HFA 108 (90 Base) MCG/ACT inhaler INHALE TWO PUFFS BY MOUTH 4 TIMES DAILY AS NEEDED  . simvastatin (ZOCOR) 10 MG tablet TAKE ONE TABLET BY MOUTH ONE TIME DAILY  . traMADol (ULTRAM) 50 MG tablet Take 1 tablet (50 mg total) by mouth every 12 (twelve) hours as needed for moderate pain.  . [EXPIRED] denosumab (PROLIA) injection 60 mg    No facility-administered encounter medications on file as of 01/02/2016.    Allergies (verified) Actonel; Fosamax; Mevacor; Miacalcin; and Mobic   History: Past Medical History  Diagnosis Date  . COPD (chronic obstructive pulmonary disease) ([place])   . Hyperlipidemia   . Leg edema   . Asthma   . Right knee pain   . Breast cancer ([place])     Right breast  . Brain tumor ([place])     1986  . Colon polyps   . Hiatal hernia   . Cataract   . Macular degeneration   . PVD (posterior vitreous detachment)   . Anemia   . Shingles rash    Past Surgical History  Procedure Laterality Date  .  Mastectomy Right   . Partial hysterectomy    . Craniectomy / craniotomy for excision of brain tumor      Left side brain  . Skin cancer excision      Under left eye  . Colonoscopy    . Polypectomy    . Abdominal hysterectomy    . Eye surgery     Family History  Problem Relation Age of Onset  . Heart disease Mother 64    Stroke   . Dementia Mother   . Congestive Heart Failure Mother   . Osteoporosis Mother   . Heart disease Sister   . Hypertension Sister   . Diabetes Sister   . Osteoporosis Sister   . Heart disease Brother   . Hyperlipidemia Brother   . Diabetes Brother   . Heart disease Sister   . Hypertension  Sister   . Diabetes Sister   . Diabetes Sister   . Hypertension Sister   . Diabetes Sister   . Hypertension Sister   . Diabetes Brother   . Hyperlipidemia Brother   . Colon cancer Neg Hx   . Rectal cancer Neg Hx   . Stomach cancer Neg Hx   . Esophageal cancer Neg Hx   . Cancer Father     lung  . Heart attack Father   . Diabetes Brother   . Heart disease Brother   . Hyperlipidemia Brother   . Diabetes Brother   . Diabetes Son    Social History   Occupational History  . Not on file.   Social History Main Topics  . Smoking status: Former Smoker    Types: Cigarettes    Quit date: 02/09/1961  . Smokeless tobacco: Never Used  . Alcohol Use: No  . Drug Use: No  . Sexual Activity: No    Calcium Assessment Calcium Intake  # of servings/day  Calcium mg  Milk (8 oz) 0  x  300  = 0  Yogurt (4 oz) 0 x  200 = 0  Cheese (1 oz) 1 x  200 = 200mg   Other Calcium sources   250mg   Ca supplement 500mg  bid = 1000mg    Estimated calcium intake per day 1450mg      Objective:    There were no vitals filed for this visit. There is no weight on file to calculate BMI.   DEXA Results Date of Test T-Score for AP Spine L1-L4 T-Score for Total Left Hip T-Score for Total Right Hip  11/22/2015 -2.6 -3.0 -2.8  07/13/2013 -2.5 -2.7 -2.4  06/04/2004 -2.2 -1.6 --  05/04/2001 -2.4 -2.0 --  04/16/1999 -2.2 -1.8 --    Fall Risk Fall Risk  12/10/2015 11/22/2015 11/07/2015 04/06/2015 11/27/2014  Falls in the past year? Yes No No No Yes  Number falls in past yr: 1 - - - 2 or more  Injury with Fall? - - - - Yes  Risk Factor Category  - - - - -  Risk for fall due to : History of fall(s);Impaired balance/gait - Impaired balance/gait - -  Risk for fall due to (comments): - - - - -  Follow up - - - - -     Assessment:    Osteoporosis - worsening BMD  Plan:   1.  Prolia 60mg  administered SQ in abdomen in office today (recieved from [facility] - patient supplied) 2.  Continue calcium  500mg  bid.  3.  Fall prevention reviewed.  4.  Next prolia in 6 months

## 2016-01-02 NOTE — Addendum Note (Signed)
Addended by: Liliane Bade on: 01/02/2016 11:46 AM   Modules accepted: Orders

## 2016-01-03 LAB — FECAL OCCULT BLOOD, IMMUNOCHEMICAL: FECAL OCCULT BLD: NEGATIVE

## 2016-01-07 ENCOUNTER — Encounter: Payer: Self-pay | Admitting: *Deleted

## 2016-01-08 ENCOUNTER — Telehealth: Payer: Self-pay | Admitting: Family Medicine

## 2016-01-08 NOTE — Telephone Encounter (Signed)
Patient asked about home monitoring system.  I mailed to her about 1 week ago.  She has not checked her mail box yet but will do tomorrow.

## 2016-01-09 ENCOUNTER — Telehealth: Payer: Self-pay | Admitting: Family Medicine

## 2016-01-09 NOTE — Telephone Encounter (Signed)
Patient called about in home fall monitoring system.  She received information I sent - she states that she still cannot afford the monthly monitoring fee.  [facility] did [facility] with [facility] and she is going to talk to them soon - they might be able to increase her benefits enough to cover the cost of the monthly monitoring fee.

## 2016-01-14 ENCOUNTER — Ambulatory Visit (INDEPENDENT_AMBULATORY_CARE_PROVIDER_SITE_OTHER): Payer: Medicare Other | Admitting: Cardiology

## 2016-01-14 ENCOUNTER — Encounter: Payer: Self-pay | Admitting: Cardiology

## 2016-01-14 VITALS — BP 132/70 | HR 94 | Ht 62.0 in | Wt 151.0 lb

## 2016-01-14 DIAGNOSIS — E782 Mixed hyperlipidemia: Secondary | ICD-10-CM

## 2016-01-14 DIAGNOSIS — I471 Supraventricular tachycardia: Secondary | ICD-10-CM

## 2016-01-14 NOTE — Progress Notes (Signed)
Cardiology Office Note  Date: 01/14/2016   ID: Krystal [name], DOB 02-23-1941, MRN [id number]  PCP: Redge Gainer, MD  Primary Cardiologist: Rozann Lesches, MD   Chief Complaint  Patient presents with  . PSVT    History of Present Illness: Krystal [name] is a 75 y.o. female last seen in August 2016. She presents for a routine follow-up visit. No reports of progressive palpitations or syncope. She does have unsteadiness and has experienced some falls without major injury. She is living in her own home now. Still visits her 64 year old mother in a nursing facility.  I reviewed her medications. Cardiac regimen includes aspirin, and Cardizem CD 240 mg daily. She also continues on Zocor with reasonable lipid control is outlined below. I reviewed her most ECG from March.  Past Medical History  Diagnosis Date  . COPD (chronic obstructive pulmonary disease) ([place])   . Hyperlipidemia   . Leg edema   . Asthma   . Right knee pain   . Breast cancer ([place])     Right breast  . Brain tumor ([place])     1986  . Colon polyps   . Hiatal hernia   . Cataract   . Macular degeneration   . PVD (posterior vitreous detachment)   . Anemia   . Shingles rash     Current Outpatient Prescriptions  Medication Sig Dispense Refill  . aspirin EC 81 MG tablet Take 81 mg by mouth at bedtime.    . Calcium Citrate-Vitamin D (CALCIUM CITRATE + D) 315-250 MG-UNIT TABS Take 2 tablets by mouth daily. 120 tablet   . cetirizine (ZYRTEC) 10 MG tablet Take 10 mg by mouth at bedtime.     . Cholecalciferol (VITAMIN D) 2000 UNITS CAPS Take 1 capsule by mouth at bedtime.    Marland Kitchen denosumab (PROLIA) 60 MG/ML SOLN injection Inject 60 mg into the skin once. Administer in upper arm, thigh, or abdomen 1 mL 0  . dextromethorphan-guaiFENesin (MUCINEX DM) 30-600 MG 12hr tablet Take 1 tablet by mouth 2 (two) times daily. 20 tablet 0  . diazepam (VALIUM) 5 MG tablet TAKE ONE TABLET BY MOUTH EVERY SIX HOURS AS NEEDED 30 tablet  1  . diltiazem (CARDIZEM CD) 240 MG 24 hr capsule TAKE ONE CAPSULE BY MOUTH ONE TIME DAILY 90 capsule 2  . esomeprazole (NEXIUM) 40 MG capsule TAKE 1 CAPSULE (40 MG TOTAL)  BY MOUTH DAILY. 90 capsule 1  . ferrous sulfate 325 (65 FE) MG tablet Take 325 mg by mouth at bedtime.    . montelukast (SINGULAIR) 10 MG tablet TAKE 1 TABLET (10 MG TOTAL) BY MOUTH AT BEDTIME. 90 tablet 1  . Multiple Vitamins-Minerals (PRESERVISION/LUTEIN PO) Take 1 capsule by mouth at bedtime.     . nitroGLYCERIN (NITROLINGUAL) 0.4 MG/SPRAY spray Use one spray under tongue as needed for chest pain. May repeat dose in 5 minutes. If chest pain not relieved than call 911. 12 g 1  . PROAIR HFA 108 (90 Base) MCG/ACT inhaler INHALE TWO PUFFS BY MOUTH 4 TIMES DAILY AS NEEDED 9 g 2  . simvastatin (ZOCOR) 10 MG tablet TAKE ONE TABLET BY MOUTH ONE TIME DAILY 90 tablet 1  . traMADol (ULTRAM) 50 MG tablet Take 1 tablet (50 mg total) by mouth every 12 (twelve) hours as needed for moderate pain. 30 tablet 0   No current facility-administered medications for this visit.   Allergies:  Actonel; Fosamax; Mevacor; Miacalcin; and Mobic   Social History: The patient  reports  that she quit smoking about 54 years ago. Her smoking use included Cigarettes. She has never used smokeless tobacco. She reports that she does not drink alcohol or use illicit drugs.   ROS:  Please see the history of present illness. Otherwise, complete review of systems is positive for unsteadiness.  All other systems are reviewed and negative.   Physical Exam: VS:  BP 132/70 mmHg  Pulse 94  Ht 5\' 2"  (1.575 m)  Wt 151 lb (68.493 kg)  BMI 27.61 kg/m2  SpO2 95%, BMI Body mass index is 27.61 kg/(m^2).  Wt Readings from Last 3 Encounters:  01/14/16 151 lb (68.493 kg)  12/10/15 154 lb (69.854 kg)  11/22/15 147 lb (66.679 kg)    Elderly woman, no distress. HEENT: Conjunctiva and lids normal, oropharynx clear. Neck: Supple, no elevated JVP or carotid bruits, no  thyromegaly. Lungs: Decreased breath sounds but clear, nonlabored breathing at rest. Cardiac: Regular rate and rhythm, no S3, soft systolic murmur, no pericardial rub. Abdomen: Soft, nontender, bowel sounds present, no guarding or rebound. Extremities: Trace ankle edema, distal pulses 2+.  ECG: I personally reviewed the prior tracing from 10/29/2015 which showed normal sinus rhythm with low voltage.  Recent Labwork: 11/03/2015: Hemoglobin 10.6* 11/22/2015: ALT 9; AST 20; BUN 21; Creatinine, Ser 0.75; Platelets 179; Potassium 4.5; Sodium 140     Component Value Date/Time   CHOL 157 11/22/2015 1105   CHOL 152 03/04/2013 1212   TRIG 92 11/22/2015 1105   TRIG 97 03/04/2013 1212   HDL 57 11/22/2015 1105   HDL 62 03/04/2013 1212   LDLCALC 75 01/27/2014 1147   LDLCALC 71 03/04/2013 1212    Other Studies Reviewed Today:  Echocardiogram 05/15/2014: Study Conclusions  - Left ventricle: The cavity size was normal. Wall thickness was normal. Systolic function was normal. The estimated ejection fraction was in the range of 60% to 65%. - Aortic valve: There was mild regurgitation. - Pulmonary arteries: PA peak pressure: 33 mm Hg (S).  Assessment and Plan:  1. History of PSVT, symptomatically well controlled on Cardizem CD. I reviewed her most recent ECG. No changes were made today.  2. Hyperlipidemia, on Zocor.  Current medicines were reviewed with the patient today.  Disposition: FU with me in 1 year.   Signed, Satira Sark, MD, [facility] 01/14/2016 2:04 PM    [facility], [place], [place] [address] Phone: [phone]; Fax: [phone]

## 2016-01-14 NOTE — Patient Instructions (Signed)
Your physician wants you to follow-up in: 1 year You will receive a reminder letter in the mail two months in advance. If you don't receive a letter, please call our office to schedule the follow-up appointment.   Your physician recommends that you continue on your current medications as directed. Please refer to the Current Medication list given to you today.    If you need a refill on your cardiac medications before your next appointment, please call your pharmacy.      Thank you for choosing [facility] !        

## 2016-01-18 DIAGNOSIS — H43813 Vitreous degeneration, bilateral: Secondary | ICD-10-CM | POA: Diagnosis not present

## 2016-01-18 DIAGNOSIS — H353123 Nonexudative age-related macular degeneration, left eye, advanced atrophic without subfoveal involvement: Secondary | ICD-10-CM | POA: Diagnosis not present

## 2016-01-18 DIAGNOSIS — H353213 Exudative age-related macular degeneration, right eye, with inactive scar: Secondary | ICD-10-CM | POA: Diagnosis not present

## 2016-03-13 ENCOUNTER — Encounter: Payer: Self-pay | Admitting: *Deleted

## 2016-03-27 ENCOUNTER — Ambulatory Visit: Payer: Medicare Other | Admitting: Family Medicine

## 2016-04-08 ENCOUNTER — Telehealth: Payer: Self-pay | Admitting: Family Medicine

## 2016-04-08 ENCOUNTER — Ambulatory Visit (INDEPENDENT_AMBULATORY_CARE_PROVIDER_SITE_OTHER): Payer: Medicare Other | Admitting: Family Medicine

## 2016-04-08 ENCOUNTER — Encounter: Payer: Self-pay | Admitting: Family Medicine

## 2016-04-08 ENCOUNTER — Other Ambulatory Visit: Payer: Self-pay | Admitting: *Deleted

## 2016-04-08 ENCOUNTER — Ambulatory Visit (INDEPENDENT_AMBULATORY_CARE_PROVIDER_SITE_OTHER): Payer: Medicare Other

## 2016-04-08 VITALS — BP 112/61 | HR 77 | Temp 97.0°F | Ht 62.0 in | Wt 160.0 lb

## 2016-04-08 DIAGNOSIS — E559 Vitamin D deficiency, unspecified: Secondary | ICD-10-CM | POA: Diagnosis not present

## 2016-04-08 DIAGNOSIS — G629 Polyneuropathy, unspecified: Secondary | ICD-10-CM

## 2016-04-08 DIAGNOSIS — R350 Frequency of micturition: Secondary | ICD-10-CM

## 2016-04-08 DIAGNOSIS — S50912A Unspecified superficial injury of left forearm, initial encounter: Secondary | ICD-10-CM

## 2016-04-08 DIAGNOSIS — W57XXXA Bitten or stung by nonvenomous insect and other nonvenomous arthropods, initial encounter: Secondary | ICD-10-CM

## 2016-04-08 DIAGNOSIS — E785 Hyperlipidemia, unspecified: Secondary | ICD-10-CM | POA: Diagnosis not present

## 2016-04-08 DIAGNOSIS — I10 Essential (primary) hypertension: Secondary | ICD-10-CM | POA: Diagnosis not present

## 2016-04-08 DIAGNOSIS — M4005 Postural kyphosis, thoracolumbar region: Secondary | ICD-10-CM

## 2016-04-08 DIAGNOSIS — D509 Iron deficiency anemia, unspecified: Secondary | ICD-10-CM | POA: Diagnosis not present

## 2016-04-08 DIAGNOSIS — C50912 Malignant neoplasm of unspecified site of left female breast: Secondary | ICD-10-CM | POA: Diagnosis not present

## 2016-04-08 LAB — MICROSCOPIC EXAMINATION

## 2016-04-08 LAB — URINALYSIS, COMPLETE
BILIRUBIN UA: NEGATIVE
GLUCOSE, UA: NEGATIVE
Ketones, UA: NEGATIVE
NITRITE UA: POSITIVE — AB
Protein, UA: NEGATIVE
RBC UA: NEGATIVE
SPEC GRAV UA: 1.02 (ref 1.005–1.030)
UUROB: 0.2 mg/dL (ref 0.2–1.0)
pH, UA: 6.5 (ref 5.0–7.5)

## 2016-04-08 MED ORDER — DOXYCYCLINE HYCLATE 100 MG PO TABS: 100.0000 mg | ORAL_TABLET | Freq: Two times a day (BID) | ORAL | 0 refills | Status: DC

## 2016-04-08 MED ORDER — DOXYCYCLINE HYCLATE 100 MG PO TABS: ORAL_TABLET | ORAL | 0 refills | Status: DC

## 2016-04-08 NOTE — Telephone Encounter (Signed)
Rx sent to pharmacy   

## 2016-04-08 NOTE — Progress Notes (Signed)
Subjective:    Patient ID: Krystal [name], female    DOB: 1940-12-01, 75 y.o.   MRN: [id number]  HPI Pt here today for follow up of chronic medical problems which include: hyperlipidemia, and hypertension. She is taking medications regularly. The patient has several complaints today. She complains of skin lesions on her face that she would like for [place] to check. She also has had a tick bite on her arm. She also has some mid to low burning pain in her back. Her BMI is 29.26. The patient's mother died the first of this month she was 77 years old. The patient had been looking after her for years and she recently went to a nursing home. The patient is okay because she knows that she is Tylenol if she could do to take good care of her mother. She denies any chest pain or shortness of breath. She denies any problem with her stomach including heartburn indigestion nausea vomiting diarrhea or blood in the stool. She does take iron for her chronic anemia and her stools are dark because of this. She has had some urinary frequency and we will check a urinalysis today. She does complain of a burning sensation in the thoracic lumbar spine area. She also comes in today and has a deer tick on her form which we will removed. She is due to get her mammogram and we will check her breasts today.       Patient Active Problem List   Diagnosis Date Noted  . Influenza with pneumonia 10/30/2015  . CAP (community acquired pneumonia) 10/29/2015  . Abdominal aortic atherosclerosis ([place]) 04/10/2015  . PSVT (paroxysmal supraventricular tachycardia) ([place]) 05/10/2014  . Macular degeneration, age related, nonexudative 08/24/2013  . History of right mastectomy 07/13/2013  . Anemia, iron deficiency 06/21/2013  . Allergic rhinitis 06/21/2013  . Chronic low back pain 06/21/2013  . Malignant neoplasm of female breast ([place]) 11/22/2007  . Mixed hyperlipidemia 11/22/2007  . Anxiety state 11/22/2007  . DEPRESSION 11/22/2007    . Asthma 11/22/2007  . GERD 11/22/2007  . GASTRITIS, WITH HEMORRHAGE 08/14/2007  . DUODENITIS, WITHOUT HEMORRHAGE 08/14/2007  . HIATAL HERNIA 08/14/2007  . 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   Outpatient Encounter Prescriptions as of 04/08/2016  Medication Sig  . aspirin EC 81 MG tablet Take 81 mg by mouth at bedtime.  . Calcium Citrate-Vitamin D (CALCIUM CITRATE + D) 315-250 MG-UNIT TABS Take 2 tablets by mouth daily.  . cetirizine (ZYRTEC) 10 MG tablet Take 10 mg by mouth at bedtime.   . Cholecalciferol (VITAMIN D) 2000 UNITS CAPS Take 1 capsule by mouth at bedtime.  Marland Kitchen denosumab (PROLIA) 60 MG/ML SOLN injection Inject 60 mg into the skin once. Administer in upper arm, thigh, or abdomen  . dextromethorphan-guaiFENesin (MUCINEX DM) 30-600 MG 12hr tablet Take 1 tablet by mouth 2 (two) times daily.  . diazepam (VALIUM) 5 MG tablet TAKE ONE TABLET BY MOUTH EVERY SIX HOURS AS NEEDED  . diltiazem (CARDIZEM CD) 240 MG 24 hr capsule TAKE ONE CAPSULE BY MOUTH ONE TIME DAILY  . esomeprazole (NEXIUM) 40 MG capsule TAKE 1 CAPSULE (40 MG TOTAL)  BY MOUTH DAILY.  . ferrous sulfate 325 (65 FE) MG tablet Take 325 mg by mouth at bedtime.  . montelukast (SINGULAIR) 10 MG tablet TAKE 1 TABLET (10 MG TOTAL) BY MOUTH AT BEDTIME.  . Multiple Vitamins-Minerals (PRESERVISION/LUTEIN PO) Take 1 capsule by mouth at bedtime.   . nitroGLYCERIN (NITROLINGUAL) 0.4 MG/SPRAY spray Use one spray under  tongue as needed for chest pain. May repeat dose in 5 minutes. If chest pain not relieved than call 911.  Marland Kitchen PROAIR HFA 108 (90 Base) MCG/ACT inhaler INHALE TWO PUFFS BY MOUTH 4 TIMES DAILY AS NEEDED  . simvastatin (ZOCOR) 10 MG tablet TAKE ONE TABLET BY MOUTH ONE TIME DAILY  . traMADol (ULTRAM) 50 MG tablet Take 1 tablet (50 mg total) by mouth every 12 (twelve) hours as needed for moderate pain.   No facility-administered encounter medications on file as of 04/08/2016.       Review of Systems  Constitutional: Negative.    HENT: Negative.   Eyes: Negative.   Respiratory: Negative.   Cardiovascular: Negative.   Gastrointestinal: Negative.   Endocrine: Negative.   Genitourinary: Negative.   Musculoskeletal: Positive for back pain (Mid to low back burning sensation).  Skin: Negative.        Skin lesions on face Tick bite, left arm  Allergic/Immunologic: Negative.   Neurological: Negative.   Hematological: Negative.   Psychiatric/Behavioral: Negative.        Objective:   Physical Exam  Constitutional: She is oriented to person, place, and time. She appears well-developed and well-nourished.  Pleasant and in good spirits today.  HENT:  Head: Normocephalic and atraumatic.  Right Ear: External ear normal.  Left Ear: External ear normal.  Nose: Nose normal.  Mouth/Throat: Oropharynx is clear and moist.  Eyes: Conjunctivae and EOM are normal. Pupils are equal, round, and reactive to light. Right eye exhibits no discharge. Left eye exhibits no discharge. No scleral icterus.  Neck: Normal range of motion. Neck supple. No JVD present. No thyromegaly present.  Cardiovascular: Normal rate, regular rhythm and intact distal pulses.   No murmur heard. Heart has a regular rate and rhythm at 72/m  Pulmonary/Chest: Effort normal and breath sounds normal. No respiratory distress. She has no wheezes. She has no rales. She exhibits no tenderness.  Clear anteriorly and posteriorly  Abdominal: Soft. Bowel sounds are normal. She exhibits no mass. There is no tenderness. There is no rebound and no guarding.  Genitourinary:  Genitourinary Comments: The patient has a right mastectomy in the left breast was checked because she is due for a mammogram.  Musculoskeletal: Normal range of motion. She exhibits deformity. She exhibits no edema or tenderness.  The patient has thoraco- lumbar kyphosis.  Lymphadenopathy:    She has no cervical adenopathy.  Neurological: She is alert and oriented to person, place, and time. She has  normal reflexes. No cranial nerve deficit.  Skin: Skin is warm and dry. Rash noted. There is erythema.  There is a tick bite with surrounding erythema from a small deer tick which will be removed.  Psychiatric: She has a normal mood and affect. Her behavior is normal. Judgment and thought content normal.  Nursing note and vitals reviewed.  the left breast was checked and no masses or axillary adenopathy was present. She's had a right mastectomy.  BP 112/61 (BP Location: Left Arm, Patient Position: Sitting)   Pulse 77   Temp 97 F (36.1 C) (Oral)   Ht 5' 2"  (1.575 m)   Wt 160 lb (72.6 kg)   BMI 29.26 kg/m   [facility] reading (PRIMARY) by  Dr. Glendon Axe and rlumbar spine films                                       Assessment &  Plan:  1. Hyperlipidemia -Continue aggressive therapeutic lifestyle changes and current treatment pending results of lab work - CBC with Differential/Platelet - NMR, lipoprofile  2. Vitamin D deficiency -Continue with current treatment pending results of lab work - CBC with Differential/Platelet - VITAMIN D 25 Hydroxy (Vit-D Deficiency, Fractures)  3. Essential hypertension -The blood pressure is good today and she will continue with current treatment - BMP8+EGFR - CBC with Differential/Platelet - Hepatic function panel  4. Anemia, iron deficiency -Continue with multivitamin with iron pending results of CBC - CBC with Differential/Platelet  5. Tick bite -The tick was removed and the patient will take doxycycline twice daily with food for 3 weeks and we will also get lab work for tick disease.  6. Postural kyphosis of thoracolumbar region -Thorax and lumbar spine films  7. Neuropathy (HCC) -Films of the thoracic and lumbar spine will be obtained   8. Breast cancer of right breast with mastectomy  No orders of the defined types were placed in this encounter.  Patient Instructions  Take antibiotic twice daily with food until completed for 3  weeks Check body more carefully especially after being out in wooded area and leafy areas for ticks   Arrie Senate MD

## 2016-04-08 NOTE — Patient Instructions (Addendum)
Take antibiotic twice daily with food until completed for 3 weeks Check body more carefully especially after being out in wooded area and leafy areas for ticks We will call with the results of lab work and  x-ray as soon as those results become available

## 2016-04-09 ENCOUNTER — Other Ambulatory Visit: Payer: Self-pay | Admitting: *Deleted

## 2016-04-09 DIAGNOSIS — H353213 Exudative age-related macular degeneration, right eye, with inactive scar: Secondary | ICD-10-CM | POA: Diagnosis not present

## 2016-04-09 LAB — NMR, LIPOPROFILE
Cholesterol: 167 mg/dL (ref 100–199)
HDL CHOLESTEROL BY NMR: 68 mg/dL (ref >39)
HDL PARTICLE NUMBER: 45.3 umol/L (ref >=30.5)
LDL Particle Number: 913 nmol/L (ref <1000)
LDL Size: 21 nm (ref >20.5)
LDL-C: 80 mg/dL (ref 0–99)
Small LDL Particle Number: 280 nmol/L (ref <=527)
Triglycerides by NMR: 93 mg/dL (ref 0–149)

## 2016-04-09 LAB — BMP8+EGFR
BUN/Creatinine Ratio: 31 — ABNORMAL HIGH (ref 12–28)
BUN: 23 mg/dL (ref 8–27)
CO2: 26 mmol/L (ref 18–29)
CREATININE: 0.75 mg/dL (ref 0.57–1.00)
Calcium: 9.4 mg/dL (ref 8.7–10.3)
Chloride: 101 mmol/L (ref 96–106)
GFR calc Af Amer: 90 mL/min/{1.73_m2} (ref >59)
GFR, EST NON AFRICAN AMERICAN: 78 mL/min/{1.73_m2} (ref >59)
GLUCOSE: 87 mg/dL (ref 65–99)
Potassium: 4.7 mmol/L (ref 3.5–5.2)
SODIUM: 141 mmol/L (ref 134–144)

## 2016-04-09 LAB — CBC WITH DIFFERENTIAL/PLATELET
BASOS: 0 %
Basophils Absolute: 0 10*3/uL (ref 0.0–0.2)
EOS (ABSOLUTE): 0.3 10*3/uL (ref 0.0–0.4)
EOS: 5 %
HEMOGLOBIN: 12.8 g/dL (ref 11.1–15.9)
Hematocrit: 39.4 % (ref 34.0–46.6)
IMMATURE GRANS (ABS): 0 10*3/uL (ref 0.0–0.1)
IMMATURE GRANULOCYTES: 0 %
Lymphocytes Absolute: 2.1 10*3/uL (ref 0.7–3.1)
Lymphs: 33 %
MCH: 29.2 pg (ref 26.6–33.0)
MCHC: 32.5 g/dL (ref 31.5–35.7)
MCV: 90 fL (ref 79–97)
MONOCYTES: 9 %
Monocytes Absolute: 0.5 10*3/uL (ref 0.1–0.9)
NEUTROS PCT: 53 %
Neutrophils Absolute: 3.4 10*3/uL (ref 1.4–7.0)
Platelets: 180 10*3/uL (ref 150–379)
RBC: 4.38 x10E6/uL (ref 3.77–5.28)
RDW: 14.6 % (ref 12.3–15.4)
WBC: 6.3 10*3/uL (ref 3.4–10.8)

## 2016-04-09 LAB — HEPATIC FUNCTION PANEL
ALBUMIN: 4.2 g/dL (ref 3.5–4.8)
ALK PHOS: 45 IU/L (ref 39–117)
ALT: 15 IU/L (ref 0–32)
AST: 23 IU/L (ref 0–40)
BILIRUBIN TOTAL: 0.4 mg/dL (ref 0.0–1.2)
BILIRUBIN, DIRECT: 0.12 mg/dL (ref 0.00–0.40)
Total Protein: 6.5 g/dL (ref 6.0–8.5)

## 2016-04-09 LAB — VITAMIN D 25 HYDROXY (VIT D DEFICIENCY, FRACTURES): Vit D, 25-Hydroxy: 48.6 ng/mL (ref 30.0–100.0)

## 2016-04-09 MED ORDER — GABAPENTIN 100 MG PO CAPS: 100.0000 mg | ORAL_CAPSULE | Freq: Every day | ORAL | 2 refills | Status: DC

## 2016-04-10 ENCOUNTER — Ambulatory Visit: Payer: Medicare Other | Admitting: Family Medicine

## 2016-04-11 LAB — URINE CULTURE

## 2016-04-25 ENCOUNTER — Telehealth: Payer: Self-pay | Admitting: Family Medicine

## 2016-04-25 NOTE — Telephone Encounter (Signed)
Patient aware of results.

## 2016-04-28 ENCOUNTER — Other Ambulatory Visit: Payer: Medicare Other

## 2016-04-28 DIAGNOSIS — N390 Urinary tract infection, site not specified: Secondary | ICD-10-CM | POA: Diagnosis not present

## 2016-04-28 DIAGNOSIS — Z87440 Personal history of urinary (tract) infections: Secondary | ICD-10-CM

## 2016-04-28 DIAGNOSIS — Z0289 Encounter for other administrative examinations: Secondary | ICD-10-CM

## 2016-04-28 LAB — URINALYSIS, COMPLETE
Bilirubin, UA: NEGATIVE
GLUCOSE, UA: NEGATIVE
Leukocytes, UA: NEGATIVE
Nitrite, UA: NEGATIVE
PROTEIN UA: NEGATIVE
RBC, UA: NEGATIVE
Specific Gravity, UA: >1.03 — ABNORMAL HIGH (ref 1.005–1.030)
UUROB: 0.2 mg/dL (ref 0.2–1.0)
pH, UA: 5.5 (ref 5.0–7.5)

## 2016-04-28 LAB — MICROSCOPIC EXAMINATION
Bacteria, UA: NONE SEEN
Epithelial Cells (non renal): NONE SEEN /hpf (ref 0–10)
RBC MICROSCOPIC, UA: NONE SEEN /HPF (ref 0–?)

## 2016-04-29 LAB — URINE CULTURE: Organism ID, Bacteria: NO GROWTH

## 2016-05-13 ENCOUNTER — Ambulatory Visit: Payer: Medicare Other

## 2016-05-14 ENCOUNTER — Other Ambulatory Visit: Payer: Self-pay

## 2016-05-14 MED ORDER — GABAPENTIN 100 MG PO CAPS: 100.0000 mg | ORAL_CAPSULE | Freq: Every day | ORAL | 0 refills | Status: DC

## 2016-05-17 ENCOUNTER — Other Ambulatory Visit: Payer: Self-pay | Admitting: Family Medicine

## 2016-05-20 NOTE — Telephone Encounter (Signed)
NTBS for antibotic refill

## 2016-05-27 ENCOUNTER — Telehealth: Payer: Self-pay | Admitting: Family Medicine

## 2016-05-27 NOTE — Telephone Encounter (Signed)
Tried to call patient - no ans - LM on VM

## 2016-05-28 ENCOUNTER — Telehealth: Payer: Self-pay | Admitting: Family Medicine

## 2016-05-29 NOTE — Telephone Encounter (Signed)
Pt wanted me to call [facility] for her, I did, I left my name and number for a call back

## 2016-05-31 ENCOUNTER — Other Ambulatory Visit: Payer: Self-pay | Admitting: Cardiology

## 2016-05-31 ENCOUNTER — Other Ambulatory Visit: Payer: Self-pay | Admitting: Family Medicine

## 2016-06-20 ENCOUNTER — Other Ambulatory Visit: Payer: Self-pay | Admitting: Pharmacist

## 2016-06-20 MED ORDER — DENOSUMAB 60 MG/ML ~~LOC~~ SOLN: 60.0000 mg | Freq: Once | SUBCUTANEOUS | 0 refills | Status: AC

## 2016-06-23 ENCOUNTER — Telehealth: Payer: Self-pay | Admitting: Family Medicine

## 2016-06-23 NOTE — Telephone Encounter (Signed)
Patient would like prolia delivered to our office - asked if I would call [facility] to tell them this because she has been at hospital with a friend and does not feel she has time to call them. I told her I would try to see what I can find out.

## 2016-06-30 NOTE — Telephone Encounter (Signed)
Confirmed with [facility] pharmacy Prolia was mailed out today and will arrive at our office tomorrow 07/01/16.  Patient's appt is 07/07/16  Patient made aware.

## 2016-07-07 ENCOUNTER — Telehealth: Payer: Self-pay | Admitting: Pharmacist

## 2016-07-07 ENCOUNTER — Ambulatory Visit (INDEPENDENT_AMBULATORY_CARE_PROVIDER_SITE_OTHER): Payer: Medicare Other | Admitting: Pharmacist

## 2016-07-07 DIAGNOSIS — M810 Age-related osteoporosis without current pathological fracture: Secondary | ICD-10-CM

## 2016-07-07 MED ORDER — DENOSUMAB 60 MG/ML ~~LOC~~ SOLN
60.0000 mg | Freq: Once | SUBCUTANEOUS | Status: AC
  Administered 2016-07-07: 60 mg via SUBCUTANEOUS

## 2016-07-07 NOTE — Progress Notes (Signed)
Patient ID: Krystal [name], female   DOB: [birth year], 75 y.o.   MRN: [id number]   Subjective:   Krystal [name] is a 75 y.o. female who presents her second Prolia injection for osteoporosis.  She tolerated the first injection well.   She has tried oral bisphosphonates in past but experienced esophagitis and tightness in her throat with alendronate and risendronate.  She took Evista about 4 years ago without problems but stopped due to cost. She has not had a fracture.    Current Medications (verified) Outpatient Encounter Prescriptions as of 07/07/2016  Medication Sig  . aspirin EC 81 MG tablet Take 81 mg by mouth at bedtime.  . Calcium Citrate-Vitamin D (CALCIUM CITRATE + D) 315-250 MG-UNIT TABS Take 2 tablets by mouth daily.  . cetirizine (ZYRTEC) 10 MG tablet Take 10 mg by mouth at bedtime.   . Cholecalciferol (VITAMIN D) 2000 UNITS CAPS Take 1 capsule by mouth at bedtime.  Marland Kitchen dextromethorphan-guaiFENesin (MUCINEX DM) 30-600 MG 12hr tablet Take 1 tablet by mouth 2 (two) times daily.  . diazepam (VALIUM) 5 MG tablet TAKE ONE TABLET BY MOUTH EVERY SIX HOURS AS NEEDED  . diltiazem (CARDIZEM CD) 240 MG 24 hr capsule TAKE ONE CAPSULE BY MOUTH ONE TIME DAILY  . doxycycline (VIBRA-TABS) 100 MG tablet Take 1 tablet (100 mg total) by mouth 2 (two) times daily.  Marland Kitchen doxycycline (VIBRA-TABS) 100 MG tablet Take 1 tablet twice daily for 21 days  . esomeprazole (NEXIUM) 40 MG capsule TAKE 1 CAPSULE (40 MG TOTAL) BY MOUTH DAILY.  . ferrous sulfate 325 (65 FE) MG tablet Take 325 mg by mouth at bedtime.  . gabapentin (NEURONTIN) 100 MG capsule Take 1 capsule (100 mg total) by mouth at bedtime.  . montelukast (SINGULAIR) 10 MG tablet TAKE 1 TABLET (10 MG TOTAL) BY MOUTH AT BEDTIME.  . Multiple Vitamins-Minerals (PRESERVISION/LUTEIN PO) Take 1 capsule by mouth at bedtime.   . nitroGLYCERIN (NITROLINGUAL) 0.4 MG/SPRAY spray Use one spray under tongue as needed for chest pain. May repeat dose in 5 minutes.  If chest pain not relieved than call 911.  Marland Kitchen PROAIR HFA 108 (90 Base) MCG/ACT inhaler INHALE TWO PUFFS BY MOUTH 4 TIMES DAILY AS NEEDED  . simvastatin (ZOCOR) 10 MG tablet TAKE ONE TABLET BY MOUTH ONE TIME DAILY  . traMADol (ULTRAM) 50 MG tablet Take 1 tablet (50 mg total) by mouth every 12 (twelve) hours as needed for moderate pain.   No facility-administered encounter medications on file as of 07/07/2016.     Allergies (verified) Actonel [risedronate]; Fosamax [alendronate sodium]; Mevacor [lovastatin]; Miacalcin [calcitonin (salmon)]; and Mobic [meloxicam]   History: Past Medical History:  Diagnosis Date  . Anemia   . Asthma   . Brain tumor ([place])    1986  . Breast cancer ([place])    Right breast  . Cataract   . Colon polyps   . COPD (chronic obstructive pulmonary disease) ([place])   . Hiatal hernia   . Hyperlipidemia   . Leg edema   . Macular degeneration   . PVD (posterior vitreous detachment)   . Right knee pain   . Shingles rash    Past Surgical History:  Procedure Laterality Date  . ABDOMINAL HYSTERECTOMY    . COLONOSCOPY    . CRANIECTOMY / CRANIOTOMY FOR EXCISION OF BRAIN TUMOR     Left side brain  . EYE SURGERY    . MASTECTOMY Right   . PARTIAL HYSTERECTOMY    . POLYPECTOMY    .  SKIN CANCER EXCISION     Under left eye   Family History  Problem Relation Age of Onset  . Heart disease Mother 88    Stroke   . Dementia Mother   . Congestive Heart Failure Mother   . Osteoporosis Mother   . Heart disease Sister   . Hypertension Sister   . Diabetes Sister   . Osteoporosis Sister   . Heart disease Brother   . Hyperlipidemia Brother   . Diabetes Brother   . Heart disease Sister   . Hypertension Sister   . Diabetes Sister   . Diabetes Sister   . Hypertension Sister   . Diabetes Sister   . Hypertension Sister   . Diabetes Brother   . Hyperlipidemia Brother   . Cancer Father     lung  . Heart attack Father   . Diabetes Brother   . Heart disease Brother   .  Hyperlipidemia Brother   . Diabetes Brother   . Diabetes Son   . Colon cancer Neg Hx   . Rectal cancer Neg Hx   . Stomach cancer Neg Hx   . Esophageal cancer Neg Hx    Social History   Occupational History  . Not on file.   Social History Main Topics  . Smoking status: Former Smoker    Types: Cigarettes    Quit date: 02/09/1961  . Smokeless tobacco: Never Used  . Alcohol use No  . Drug use: No  . Sexual activity: No    Calcium Assessment Calcium Intake  # of servings/day  Calcium mg  Milk (8 oz) 0  x  300  = 0  Yogurt (4 oz) 0 x  200 = 0  Cheese (1 oz) 1 x  200 = 200mg   Other Calcium sources   250mg   Ca supplement 500mg  bid = 1000mg    Estimated calcium intake per day 1450mg      Objective:    There were no vitals filed for this visit. There is no height or weight on file to calculate BMI.   DEXA Results Date of Test T-Score for AP Spine L1-L4 T-Score for Total Left Hip T-Score for Total Right Hip  11/22/2015 -2.6 -3.0 -2.8  07/13/2013 -2.5 -2.7 -2.4  06/04/2004 -2.2 -1.6 --  05/04/2001 -2.4 -2.0 --  04/16/1999 -2.2 -1.8 --     Assessment:    Osteoporosis  Plan:   1.  Prolia 60mg  administered SQ in abdomen in office today (recieved from [facility] pharmacy - patient supplied) 2.  Continue calcium 500mg  bid.  3.  Fall prevention reviewed.  4.  Next prolia and AWV in 6 months  5.  Patient requested increase in gabapentin dose - request sent to her PCP  Cherre Robins, PharmD, CPP, CDE

## 2016-07-07 NOTE — Patient Instructions (Signed)
Fall Prevention in the Home Falls can cause injuries and can affect people from all age groups. There are many simple things that you can do to make your home safe and to help prevent falls. What can I do on the outside of my home?  Regularly repair the edges of walkways and driveways and fix any cracks.  Remove high doorway thresholds.  Trim any shrubbery on the main path into your home.  Use bright outdoor lighting.  Clear walkways of debris and clutter, including tools and rocks.  Regularly check that handrails are securely fastened and in good repair. Both sides of any steps should have handrails.  Install guardrails along the edges of any raised decks or porches.  Have leaves, snow, and ice cleared regularly.  Use sand or salt on walkways during winter months.  In the garage, clean up any spills right away, including grease or oil spills. What can I do in the bathroom?  Use night lights.  Install grab bars by the toilet and in the tub and shower. Do not use towel bars as grab bars.  Use non-skid mats or decals on the floor of the tub or shower.  If you need to sit down while you are in the shower, use a plastic, non-slip stool.  Keep the floor dry. Immediately clean up any water that spills on the floor.  Remove soap buildup in the tub or shower on a regular basis.  Attach bath mats securely with double-sided non-slip rug tape.  Remove throw rugs and other tripping hazards from the floor. What can I do in the bedroom?  Use night lights.  Make sure that a bedside light is easy to reach.  Do not use oversized bedding that drapes onto the floor.  Have a firm chair that has side arms to use for getting dressed.  Remove throw rugs and other tripping hazards from the floor. What can I do in the kitchen?  Clean up any spills right away.  Avoid walking on wet floors.  Place frequently used items in easy-to-reach places.  If you need to reach for something above  you, use a sturdy step stool that has a grab bar.  Keep electrical cables out of the way.  Do not use floor polish or wax that makes floors slippery. If you have to use wax, make sure that it is non-skid floor wax.  Remove throw rugs and other tripping hazards from the floor. What can I do in the stairways?  Do not leave any items on the stairs.  Make sure that there are handrails on both sides of the stairs. Fix handrails that are broken or loose. Make sure that handrails are as long as the stairways.  Check any carpeting to make sure that it is firmly attached to the stairs. Fix any carpet that is loose or worn.  Avoid having throw rugs at the top or bottom of stairways, or secure the rugs with carpet tape to prevent them from moving.  Make sure that you have a light switch at the top of the stairs and the bottom of the stairs. If you do not have them, have them installed. What are some other fall prevention tips?  Wear closed-toe shoes that fit well and support your feet. Wear shoes that have rubber soles or low heels.  When you use a stepladder, make sure that it is completely opened and that the sides are firmly locked. Have someone hold the ladder while you are using   it. Do not climb a closed stepladder.  Add color or contrast paint or tape to grab bars and handrails in your home. Place contrasting color strips on the first and last steps.  Use mobility aids as needed, such as canes, walkers, scooters, and crutches.  Turn on lights if it is dark. Replace any light bulbs that burn out.  Set up furniture so that there are clear paths. Keep the furniture in the same spot.  Fix any uneven floor surfaces.  Choose a carpet design that does not hide the edge of steps of a stairway.  Be aware of any and all pets.  Review your medicines with your healthcare provider. Some medicines can cause dizziness or changes in blood pressure, which increase your risk of falling. Talk with  your health care provider about other ways that you can decrease your risk of falls. This may include working with a physical therapist or trainer to improve your strength, balance, and endurance. This information is not intended to replace advice given to you by your health care provider. Make sure you discuss any questions you have with your health care provider. Document Released: 07/11/2002 Document Revised: 12/18/2015 Document Reviewed: 08/25/2014 Elsevier Interactive Patient Education  2017 Elsevier Inc.  

## 2016-07-07 NOTE — Telephone Encounter (Signed)
The patient may increase the gabapentin from 100 mg at bedtime to 200 mg at bedtime. Wait 1 week and if no better or increase it to 300 mg at bedtime and then call us after week of doing that

## 2016-07-08 MED ORDER — GABAPENTIN 100 MG PO CAPS: ORAL_CAPSULE | ORAL | 0 refills | Status: DC

## 2016-07-09 DIAGNOSIS — H43393 Other vitreous opacities, bilateral: Secondary | ICD-10-CM | POA: Diagnosis not present

## 2016-07-09 DIAGNOSIS — H43813 Vitreous degeneration, bilateral: Secondary | ICD-10-CM | POA: Diagnosis not present

## 2016-07-09 DIAGNOSIS — H353212 Exudative age-related macular degeneration, right eye, with inactive choroidal neovascularization: Secondary | ICD-10-CM | POA: Diagnosis not present

## 2016-07-09 DIAGNOSIS — H353123 Nonexudative age-related macular degeneration, left eye, advanced atrophic without subfoveal involvement: Secondary | ICD-10-CM | POA: Diagnosis not present

## 2016-08-14 ENCOUNTER — Ambulatory Visit (INDEPENDENT_AMBULATORY_CARE_PROVIDER_SITE_OTHER): Payer: Medicare Other | Admitting: Family Medicine

## 2016-08-14 ENCOUNTER — Encounter: Payer: Self-pay | Admitting: Family Medicine

## 2016-08-14 VITALS — BP 105/60 | HR 82 | Temp 97.3°F | Ht 62.0 in | Wt 162.0 lb

## 2016-08-14 DIAGNOSIS — E559 Vitamin D deficiency, unspecified: Secondary | ICD-10-CM | POA: Diagnosis not present

## 2016-08-14 DIAGNOSIS — M4005 Postural kyphosis, thoracolumbar region: Secondary | ICD-10-CM | POA: Diagnosis not present

## 2016-08-14 DIAGNOSIS — G629 Polyneuropathy, unspecified: Secondary | ICD-10-CM | POA: Diagnosis not present

## 2016-08-14 DIAGNOSIS — M25562 Pain in left knee: Secondary | ICD-10-CM | POA: Diagnosis not present

## 2016-08-14 DIAGNOSIS — I10 Essential (primary) hypertension: Secondary | ICD-10-CM

## 2016-08-14 DIAGNOSIS — I700 Atherosclerosis of aorta: Secondary | ICD-10-CM

## 2016-08-14 DIAGNOSIS — E7800 Pure hypercholesterolemia, unspecified: Secondary | ICD-10-CM

## 2016-08-14 DIAGNOSIS — I471 Supraventricular tachycardia, unspecified: Secondary | ICD-10-CM

## 2016-08-14 DIAGNOSIS — L989 Disorder of the skin and subcutaneous tissue, unspecified: Secondary | ICD-10-CM

## 2016-08-14 DIAGNOSIS — D509 Iron deficiency anemia, unspecified: Secondary | ICD-10-CM

## 2016-08-14 MED ORDER — DIAZEPAM 5 MG PO TABS: 5.0000 mg | ORAL_TABLET | Freq: Four times a day (QID) | ORAL | 5 refills | Status: DC | PRN

## 2016-08-14 NOTE — Progress Notes (Signed)
Subjective:    Patient ID: Krystal [name], female    DOB: 06-16-1941, 76 y.o.   MRN: [id number]  HPI Pt here for follow up and management of chronic medical problems which includes anemia, hypertension and hyperlipidemia. She is taking medications regularly.The patient is concerned about her left lower leg. She is also concerned about some skin lesions on her face and is requesting a refill on her diazepam. The patient complains of some left lateral lower leg pain that seems to be worse at night and especially when she's laying on that side of her body. There is no injury. She also complains of some pain below the medial left knee. The skin lesions have been going on for a couple of months. She does occasionally feel like there is a band around her chest and the lower rib cage area but it is not related to any kind of activity. She can take a value minute gets better or she can lay down and rest and it gets better but it may happen when she is resting also. She denies any shortness of breath or any trouble with her stomach as far as nausea vomiting diarrhea or blood in the stool. Her stools are dark because she takes iron. She is passing her water without problems.   Patient Active Problem List   Diagnosis Date Noted  . Influenza with pneumonia 10/30/2015  . CAP (community acquired pneumonia) 10/29/2015  . Abdominal aortic atherosclerosis ([place]) 04/10/2015  . PSVT (paroxysmal supraventricular tachycardia) ([place]) 05/10/2014  . Macular degeneration, age related, nonexudative 08/24/2013  . History of right mastectomy 07/13/2013  . Anemia, iron deficiency 06/21/2013  . Allergic rhinitis 06/21/2013  . Chronic low back pain 06/21/2013  . Malignant neoplasm of female breast ([place]) 11/22/2007  . Mixed hyperlipidemia 11/22/2007  . Anxiety state 11/22/2007  . DEPRESSION 11/22/2007  . Asthma 11/22/2007  . GERD 11/22/2007  . GASTRITIS, WITH HEMORRHAGE 08/14/2007  . DUODENITIS, WITHOUT HEMORRHAGE  08/14/2007  . HIATAL HERNIA 08/14/2007  . 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   Outpatient Encounter Prescriptions as of 08/14/2016  Medication Sig  . aspirin EC 81 MG tablet Take 81 mg by mouth at bedtime.  . Calcium Citrate-Vitamin D (CALCIUM CITRATE + D) 315-250 MG-UNIT TABS Take 2 tablets by mouth daily.  . cetirizine (ZYRTEC) 10 MG tablet Take 10 mg by mouth at bedtime.   . Cholecalciferol (VITAMIN D) 2000 UNITS CAPS Take 1 capsule by mouth at bedtime.  Marland Kitchen dextromethorphan-guaiFENesin (MUCINEX DM) 30-600 MG 12hr tablet Take 1 tablet by mouth 2 (two) times daily.  . diazepam (VALIUM) 5 MG tablet TAKE ONE TABLET BY MOUTH EVERY SIX HOURS AS NEEDED  . diltiazem (CARDIZEM CD) 240 MG 24 hr capsule TAKE ONE CAPSULE BY MOUTH ONE TIME DAILY  . esomeprazole (NEXIUM) 40 MG capsule TAKE 1 CAPSULE (40 MG TOTAL) BY MOUTH DAILY.  . ferrous sulfate 325 (65 FE) MG tablet Take 325 mg by mouth at bedtime.  . gabapentin (NEURONTIN) 100 MG capsule Take 1-3 caps QHS PRN  . montelukast (SINGULAIR) 10 MG tablet TAKE 1 TABLET (10 MG TOTAL) BY MOUTH AT BEDTIME.  . Multiple Vitamins-Minerals (PRESERVISION/LUTEIN PO) Take 1 capsule by mouth at bedtime.   . nitroGLYCERIN (NITROLINGUAL) 0.4 MG/SPRAY spray Use one spray under tongue as needed for chest pain. May repeat dose in 5 minutes. If chest pain not relieved than call 911.  Marland Kitchen PROAIR HFA 108 (90 Base) MCG/ACT inhaler INHALE TWO PUFFS BY MOUTH 4 TIMES DAILY AS NEEDED  .  simvastatin (ZOCOR) 10 MG tablet TAKE ONE TABLET BY MOUTH ONE TIME DAILY  . traMADol (ULTRAM) 50 MG tablet Take 1 tablet (50 mg total) by mouth every 12 (twelve) hours as needed for moderate pain.   No facility-administered encounter medications on file as of 08/14/2016.       Review of Systems  Constitutional: Negative.   HENT: Negative.   Eyes: Negative.   Respiratory: Negative.   Cardiovascular: Negative.   Gastrointestinal: Negative.   Endocrine: Negative.   Genitourinary: Negative.     Musculoskeletal: Negative.   Skin: Negative.        Left lower outer extremity indention w/ pain  2 skin lesions - face  Allergic/Immunologic: Negative.   Neurological: Negative.   Hematological: Negative.   Psychiatric/Behavioral: Negative.        Objective:   Physical Exam  Constitutional: She is oriented to person, place, and time. She appears well-developed and well-nourished.  HENT:  Head: Normocephalic and atraumatic.  Right Ear: External ear normal.  Left Ear: External ear normal.  Nose: Nose normal.  Mouth/Throat: Oropharynx is clear and moist.  Eyes: Conjunctivae and EOM are normal. Pupils are equal, round, and reactive to light. Right eye exhibits no discharge. Left eye exhibits no discharge. No scleral icterus.  Neck: Normal range of motion. Neck supple. No thyromegaly present.  No bruits thyromegaly or anterior cervical adenopathy  Cardiovascular: Normal rate, regular rhythm and intact distal pulses.   No murmur heard. The rhythm was regular at 72/m  Pulmonary/Chest: Effort normal and breath sounds normal. No respiratory distress. She has no wheezes. She has no rales. She exhibits no tenderness.  Clear anteriorly and posteriorly  Abdominal: Soft. Bowel sounds are normal. She exhibits no mass. There is no tenderness. There is no rebound and no guarding.  There is generalized upper abdominal tenderness without masses. This is related to the discomfort that she feels like it is going around her chest. She did have a chest x-ray in the spring of last year.  Musculoskeletal: Normal range of motion. She exhibits no edema or tenderness.  Lymphadenopathy:    She has no cervical adenopathy.  Neurological: She is alert and oriented to person, place, and time. She has normal reflexes. No cranial nerve deficit.  Skin: Skin is warm and dry.  Psychiatric: She has a normal mood and affect. Her behavior is normal. Judgment and thought content normal.  Nursing note and vitals  reviewed.   BP 105/60 (BP Location: Left Arm)   Pulse 82   Temp 97.3 F (36.3 C) (Oral)   Ht 5' 2" (1.575 m)   Wt 162 lb (73.5 kg)   BMI 29.63 kg/m        Assessment & Plan:  1. Pure hypercholesterolemia -The patient should continue with her simvastatin and aggressive therapeutic lifestyle changes pending results of lab work - CBC with Differential/Platelet - Lipid panel  2. Vitamin D deficiency -Continue with current treatment pending results of lab work - CBC with Differential/Platelet - VITAMIN D 25 Hydroxy (Vit-D Deficiency, Fractures)  3. Essential hypertension -The blood pressure is good today and she will continue with current treatment - BMP8+EGFR - CBC with Differential/Platelet - Hepatic function panel  4. Iron deficiency anemia, unspecified iron deficiency anemia type -Continue with iron replacement - CBC with Differential/Platelet  5. Abdominal aortic atherosclerosis ([place]) -Continue with simvastatin and aggressive therapeutic lifestyle changes pending results of lab work  6. Paroxysmal supraventricular tachycardia ([place]) -The heart has a regular rate and rhythm  today at 72/m.  7. Thoracic aortic atherosclerosis ([place]) -Continue with simvastatin and aggressive therapeutic lifestyle changes  8. 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  9. Postural kyphosis of thoracolumbar region -Thoracic spine films were reviewed from last spring and these showed a lot of spurring in the thoracic spine and this most likely is related to the discomfort she is having around in the upper abdomen on both sides that she describes as a band like feeling at times.  Meds ordered this encounter  Medications  . diazepam (VALIUM) 5 MG tablet    Sig: Take 1 tablet (5 mg total) by mouth every 6 (six) hours as needed.    Dispense:  30 tablet     Refill:  5    Not to exceed 6 additional fills before 01/09/2016   Patient Instructions                       Medicare Annual Wellness Visit  [place] and the medical providers at [facility] strive to bring you the best medical care.  In doing so we not only want to address your current medical conditions and concerns but also to detect new conditions early and prevent illness, disease and health-related problems.    Medicare offers a yearly Wellness Visit which allows our clinical staff to assess your need for preventative services including immunizations, lifestyle education, counseling to decrease risk of preventable diseases and screening for fall risk and other medical concerns.    This visit is provided free of charge (no copay) for all Medicare recipients. The clinical pharmacists at [facility] have begun to conduct these Wellness Visits which will also include a thorough review of all your medications.    As you primary medical provider recommend that you make an appointment for your Annual Wellness Visit if you have not done so already this year.  You may set up this appointment before you leave today or you may call back [phone] and schedule an appointment.  Please make sure when you call that you mention that you are scheduling your Annual Wellness Visit with the clinical pharmacist so that the appointment may be made for the proper length of time.     Continue current medications. Continue good therapeutic lifestyle changes which include good diet and exercise. Fall precautions discussed with patient. If an FOBT was given today- please return it to our front desk. If you are over 78 years old - you may need Prevnar 19 or the adult Pneumonia vaccine.  **Flu shots are available--- please call and schedule a FLU-CLINIC appointment**  After your visit with [place] today you will receive a survey in the mail or online from [employer]  regarding your care with [place]. Please take a moment to fill this out. Your feedback is very important to [place] as you can help [place] better understand your patient needs as well as improve your experience and satisfaction. WE CARE ABOUT YOU!!!   We will arrange for you to have a visit with the dermatologist to further evaluate the skin lesions on the face and especially since she had a skin cancer removed before. We will also arrange for you to have venous Dopplers of the left lower extremity rule out any DVT. 20th, the patient should continue to drink plenty  of fluids and stay well hydrated    Arrie Senate MD

## 2016-08-14 NOTE — Patient Instructions (Addendum)
Medicare Annual Wellness Visit  [facility] and the medical providers at [facility] strive to bring you the best medical care.  In doing so we not only want to address your current medical conditions and concerns but also to detect new conditions early and prevent illness, disease and health-related problems.    Medicare offers a yearly Wellness Visit which allows our clinical staff to assess your need for preventative services including immunizations, lifestyle education, counseling to decrease risk of preventable diseases and screening for fall risk and other medical concerns.    This visit is provided free of charge (no copay) for all Medicare recipients. The clinical pharmacists at [facility] have begun to conduct these Wellness Visits which will also include a thorough review of all your medications.    As you primary medical provider recommend that you make an appointment for your Annual Wellness Visit if you have not done so already this year.  You may set up this appointment before you leave today or you may call back [phone] and schedule an appointment.  Please make sure when you call that you mention that you are scheduling your Annual Wellness Visit with the clinical pharmacist so that the appointment may be made for the proper length of time.     Continue current medications. Continue good therapeutic lifestyle changes which include good diet and exercise. Fall precautions discussed with patient. If an FOBT was given today- please return it to our front desk. If you are over 58 years old - you may need Prevnar 74 or the adult Pneumonia vaccine.  **Flu shots are available--- please call and schedule a FLU-CLINIC appointment**  After your visit with [place] today you will receive a survey in the mail or online from [employer] regarding your care with [place]. Please take a moment to fill this out. Your feedback is very  important to [place] as you can help [place] better understand your patient needs as well as improve your experience and satisfaction. WE CARE ABOUT YOU!!!   We will arrange for you to have a visit with the dermatologist to further evaluate the skin lesions on the face and especially since she had a skin cancer removed before. We will also arrange for you to have venous Dopplers of the left lower extremity rule out any DVT. 20th, the patient should continue to drink plenty of fluids and stay well hydrated

## 2016-08-14 NOTE — Addendum Note (Signed)
Addended by: Zannie Cove on: 08/14/2016 12:20 PM   Modules accepted: Orders

## 2016-08-15 LAB — CBC WITH DIFFERENTIAL/PLATELET
BASOS: 0 %
Basophils Absolute: 0 10*3/uL (ref 0.0–0.2)
EOS (ABSOLUTE): 0.3 10*3/uL (ref 0.0–0.4)
Eos: 5 %
HEMOGLOBIN: 12.4 g/dL (ref 11.1–15.9)
Hematocrit: 37 % (ref 34.0–46.6)
IMMATURE GRANS (ABS): 0 10*3/uL (ref 0.0–0.1)
Immature Granulocytes: 0 %
LYMPHS: 19 %
Lymphocytes Absolute: 1.3 10*3/uL (ref 0.7–3.1)
MCH: 30.4 pg (ref 26.6–33.0)
MCHC: 33.5 g/dL (ref 31.5–35.7)
MCV: 91 fL (ref 79–97)
MONOCYTES: 6 %
Monocytes Absolute: 0.4 10*3/uL (ref 0.1–0.9)
NEUTROS ABS: 5 10*3/uL (ref 1.4–7.0)
Neutrophils: 70 %
Platelets: 187 10*3/uL (ref 150–379)
RBC: 4.08 x10E6/uL (ref 3.77–5.28)
RDW: 14.1 % (ref 12.3–15.4)
WBC: 7.1 10*3/uL (ref 3.4–10.8)

## 2016-08-15 LAB — HEPATIC FUNCTION PANEL
ALK PHOS: 38 IU/L — AB (ref 39–117)
ALT: 9 IU/L (ref 0–32)
AST: 16 IU/L (ref 0–40)
Albumin: 4.2 g/dL (ref 3.5–4.8)
BILIRUBIN, DIRECT: 0.11 mg/dL (ref 0.00–0.40)
Bilirubin Total: 0.4 mg/dL (ref 0.0–1.2)
Total Protein: 6.2 g/dL (ref 6.0–8.5)

## 2016-08-15 LAB — BMP8+EGFR
BUN / CREAT RATIO: 21 (ref 12–28)
BUN: 17 mg/dL (ref 8–27)
CO2: 27 mmol/L (ref 18–29)
CREATININE: 0.82 mg/dL (ref 0.57–1.00)
Calcium: 9 mg/dL (ref 8.7–10.3)
Chloride: 103 mmol/L (ref 96–106)
GFR calc Af Amer: 80 mL/min/{1.73_m2} (ref >59)
GFR calc non Af Amer: 69 mL/min/{1.73_m2} (ref >59)
GLUCOSE: 91 mg/dL (ref 65–99)
Potassium: 4.2 mmol/L (ref 3.5–5.2)
Sodium: 145 mmol/L — ABNORMAL HIGH (ref 134–144)

## 2016-08-15 LAB — LIPID PANEL
Chol/HDL Ratio: 2.7 ratio units (ref 0.0–4.4)
Cholesterol, Total: 156 mg/dL (ref 100–199)
HDL: 58 mg/dL (ref >39)
LDL Calculated: 80 mg/dL (ref 0–99)
Triglycerides: 89 mg/dL (ref 0–149)
VLDL CHOLESTEROL CAL: 18 mg/dL (ref 5–40)

## 2016-08-15 LAB — VITAMIN D 25 HYDROXY (VIT D DEFICIENCY, FRACTURES): VIT D 25 HYDROXY: 43 ng/mL (ref 30.0–100.0)

## 2016-08-25 ENCOUNTER — Telehealth: Payer: Self-pay | Admitting: Family Medicine

## 2016-08-25 NOTE — Telephone Encounter (Signed)
The order  - is there - will check with the referral coordinate

## 2016-08-25 NOTE — Telephone Encounter (Signed)
LM  To cont to call for rescheduling

## 2016-08-29 ENCOUNTER — Ambulatory Visit (HOSPITAL_COMMUNITY)
Admission: RE | Admit: 2016-08-29 | Discharge: 2016-08-29 | Disposition: A | Discharge status: Home / Self Care | Payer: Medicare Other | Source: Ambulatory Visit | Attending: Family Medicine | Admitting: Family Medicine

## 2016-08-29 DIAGNOSIS — M25562 Pain in left knee: Secondary | ICD-10-CM

## 2016-08-29 DIAGNOSIS — M79605 Pain in left leg: Secondary | ICD-10-CM | POA: Diagnosis not present

## 2016-09-01 ENCOUNTER — Telehealth: Payer: Self-pay | Admitting: Family Medicine

## 2016-09-01 DIAGNOSIS — G8929 Other chronic pain: Secondary | ICD-10-CM

## 2016-09-01 DIAGNOSIS — M545 Low back pain: Principal | ICD-10-CM

## 2016-09-01 NOTE — Telephone Encounter (Signed)
Pt aware MRI will be set up

## 2016-09-01 NOTE — Telephone Encounter (Signed)
Please arrange to get MRI of lumbar spine. She has had regular films of the back which showed degenerative changes at L4 and 5. With the persistent neuropathy she does need to have additional

## 2016-09-01 NOTE — Telephone Encounter (Signed)
Please arrange for patient to have MRI of lumbar spine. With the persistent discomfort and the previous findings on LS spine film she does need to have an MRI to further evaluate this.

## 2016-09-01 NOTE — Telephone Encounter (Signed)
Patient aware of doppler and she states that her leg is still hurting and keeping her up at night. Patient wants to know what her next option is or what you recommend?

## 2016-09-08 ENCOUNTER — Telehealth: Payer: Self-pay

## 2016-09-09 DIAGNOSIS — L821 Other seborrheic keratosis: Secondary | ICD-10-CM | POA: Diagnosis not present

## 2016-09-09 DIAGNOSIS — L820 Inflamed seborrheic keratosis: Secondary | ICD-10-CM | POA: Diagnosis not present

## 2016-09-09 DIAGNOSIS — L570 Actinic keratosis: Secondary | ICD-10-CM | POA: Diagnosis not present

## 2016-09-09 NOTE — Telephone Encounter (Signed)
Please tell patient that she may have to pay for this out of pocket. She should take the lowest dose possible and try to reduce the dosage to not having to take it at all

## 2016-09-09 NOTE — Telephone Encounter (Signed)
Pt LM to call about the diazepam coverage

## 2016-09-11 ENCOUNTER — Other Ambulatory Visit: Payer: Self-pay | Admitting: *Deleted

## 2016-09-11 DIAGNOSIS — M545 Low back pain: Principal | ICD-10-CM

## 2016-09-11 DIAGNOSIS — G8929 Other chronic pain: Secondary | ICD-10-CM

## 2016-09-11 DIAGNOSIS — R9389 Abnormal findings on diagnostic imaging of other specified body structures: Secondary | ICD-10-CM

## 2016-09-12 ENCOUNTER — Ambulatory Visit (HOSPITAL_COMMUNITY)
Admission: RE | Admit: 2016-09-12 | Discharge: 2016-09-12 | Disposition: A | Discharge status: Home / Self Care | Payer: Medicare Other | Source: Ambulatory Visit | Attending: Family Medicine | Admitting: Family Medicine

## 2016-09-12 DIAGNOSIS — R9389 Abnormal findings on diagnostic imaging of other specified body structures: Secondary | ICD-10-CM

## 2016-09-12 DIAGNOSIS — G8929 Other chronic pain: Secondary | ICD-10-CM | POA: Diagnosis not present

## 2016-09-12 DIAGNOSIS — M1288 Other specific arthropathies, not elsewhere classified, other specified site: Secondary | ICD-10-CM | POA: Insufficient documentation

## 2016-09-12 DIAGNOSIS — M545 Low back pain: Secondary | ICD-10-CM | POA: Diagnosis not present

## 2016-09-12 DIAGNOSIS — M5137 Other intervertebral disc degeneration, lumbosacral region: Secondary | ICD-10-CM | POA: Diagnosis not present

## 2016-09-12 DIAGNOSIS — M4807 Spinal stenosis, lumbosacral region: Secondary | ICD-10-CM | POA: Insufficient documentation

## 2016-09-12 DIAGNOSIS — R938 Abnormal findings on diagnostic imaging of other specified body structures: Secondary | ICD-10-CM | POA: Diagnosis not present

## 2016-09-12 DIAGNOSIS — M47816 Spondylosis without myelopathy or radiculopathy, lumbar region: Secondary | ICD-10-CM | POA: Diagnosis not present

## 2016-09-15 ENCOUNTER — Other Ambulatory Visit: Payer: Self-pay | Admitting: *Deleted

## 2016-09-15 ENCOUNTER — Telehealth: Payer: Self-pay | Admitting: Family Medicine

## 2016-09-15 DIAGNOSIS — M5136 Other intervertebral disc degeneration, lumbar region: Secondary | ICD-10-CM

## 2016-09-15 DIAGNOSIS — M48061 Spinal stenosis, lumbar region without neurogenic claudication: Secondary | ICD-10-CM

## 2016-09-15 DIAGNOSIS — R937 Abnormal findings on diagnostic imaging of other parts of musculoskeletal system: Secondary | ICD-10-CM

## 2016-09-15 NOTE — Telephone Encounter (Signed)
Would not leave a message. Says she will call back later

## 2016-09-18 IMAGING — CR DG LUMBAR SPINE 2-3V
2 series · 2 of 2 positions shown · non-contrast
Comparison: Coronal and sagittal reconstructed images through the
lumbar spine from an abdominal and pelvic CT scan April 10, 2015

CLINICAL DATA: Mid lower back pain without known injury or sciatica

EXAM:
LUMBAR SPINE - 2-3 VIEW

[view not recorded (1 of 2)]
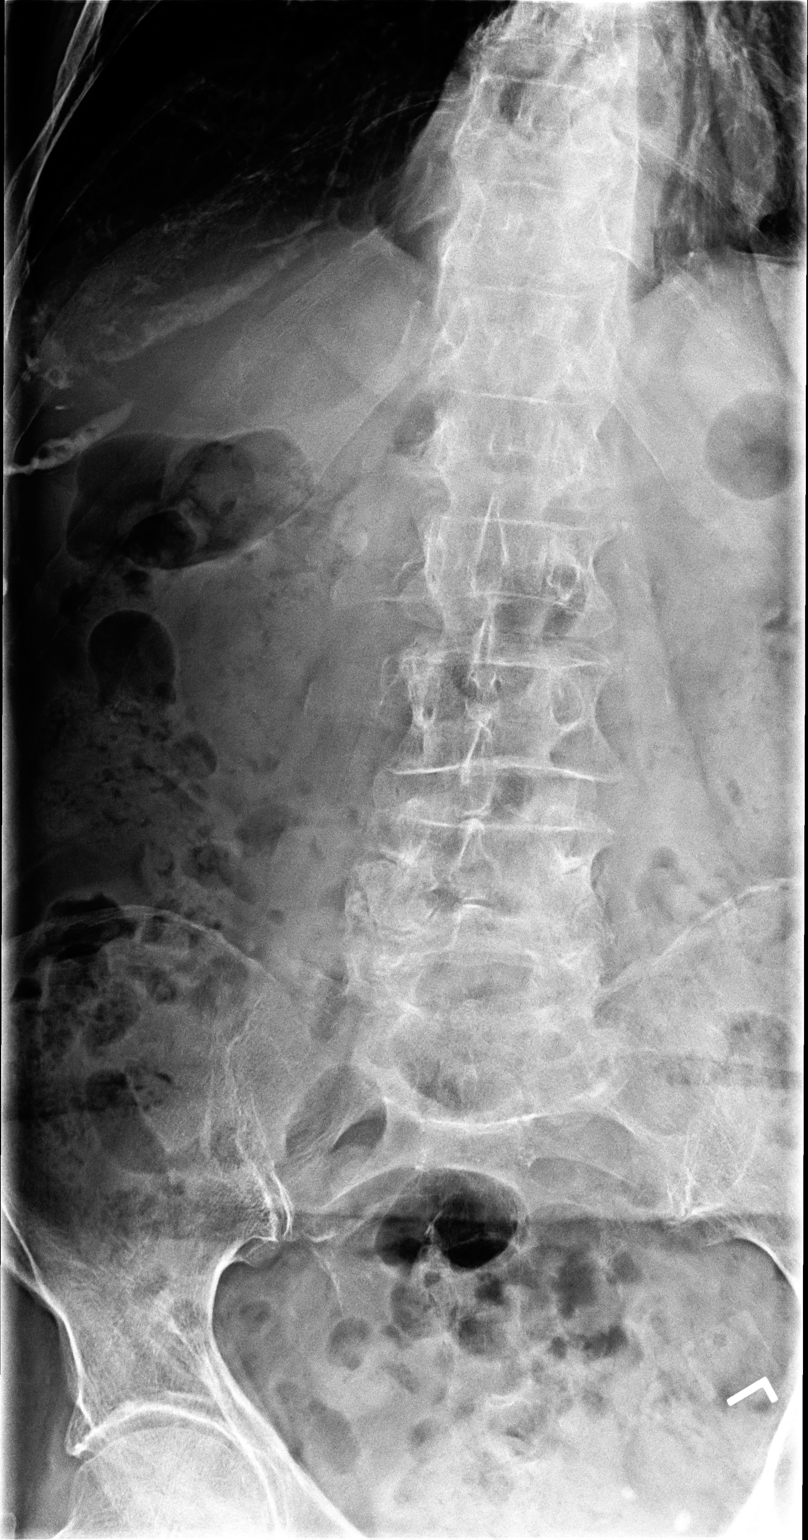

[view not recorded (2 of 2)]
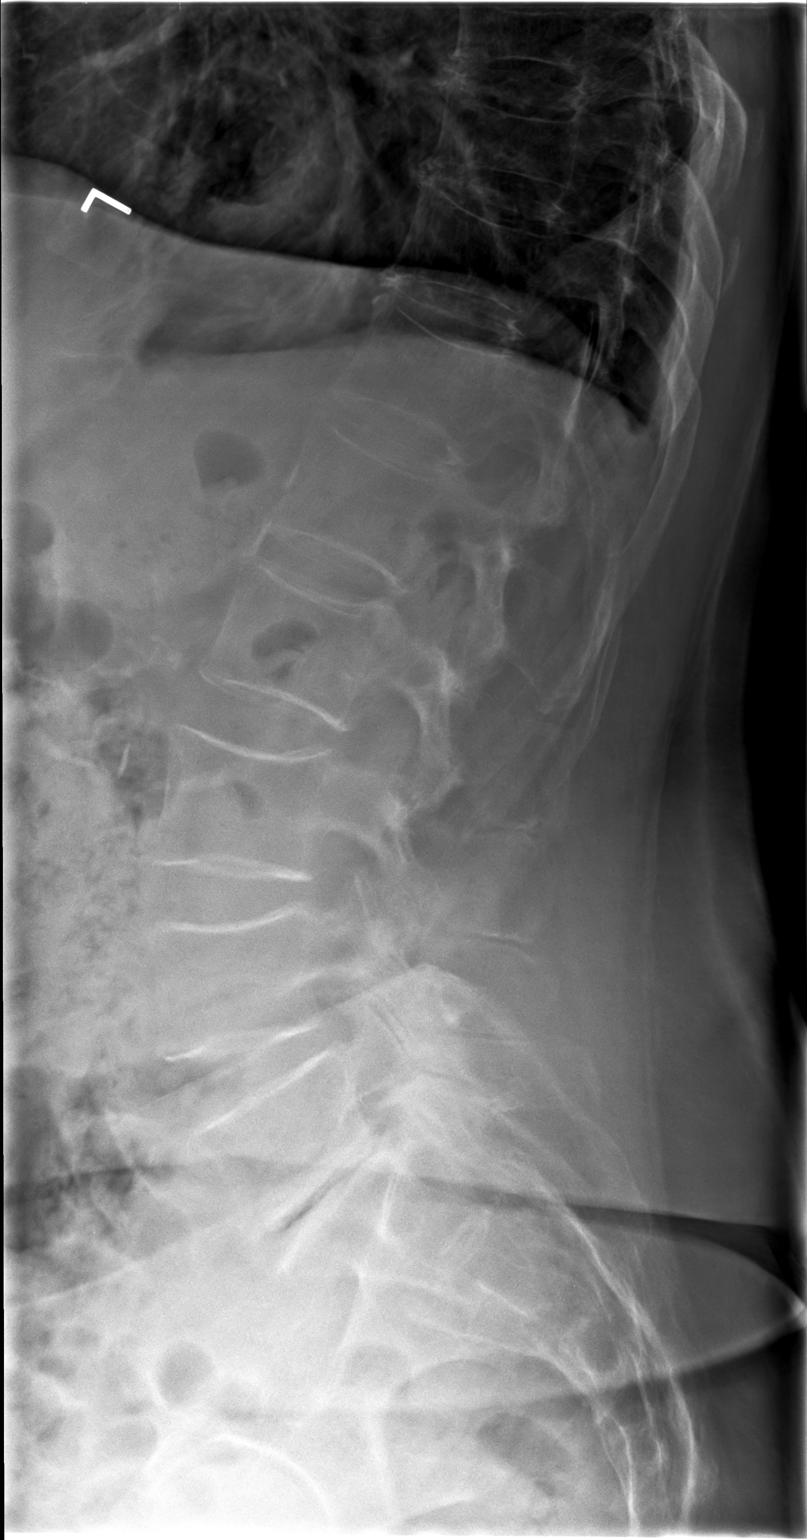

[2 of 2 positions shown; findings below may reference images not displayed]

FINDINGS: The lumbar vertebral bodies are preserved in height. There is
moderate disc space narrowing at L5-S1. There is facet joint
hypertrophy at L4-5 and L5-S1. There is no spondylolisthesis. The
pedicles and transverse processes are intact where visualized. The
observed portions of the sacrum are normal.
IMPRESSION: There is no acute bony abnormality of the lumbar spine. There is
degenerative disc narrowing at L5-S1 and facet joint hypertrophy at
L4-5 and L5-S1.

## 2016-09-23 ENCOUNTER — Encounter: Payer: Self-pay | Admitting: *Deleted

## 2016-10-08 DIAGNOSIS — M5136 Other intervertebral disc degeneration, lumbar region: Secondary | ICD-10-CM | POA: Diagnosis not present

## 2016-10-08 DIAGNOSIS — M170 Bilateral primary osteoarthritis of knee: Secondary | ICD-10-CM | POA: Diagnosis not present

## 2016-10-16 ENCOUNTER — Ambulatory Visit: Payer: Medicare Other | Attending: Physical Medicine and Rehabilitation | Admitting: Physical Therapy

## 2016-10-16 DIAGNOSIS — G8929 Other chronic pain: Secondary | ICD-10-CM | POA: Diagnosis not present

## 2016-10-16 DIAGNOSIS — M545 Low back pain: Secondary | ICD-10-CM | POA: Insufficient documentation

## 2016-10-16 DIAGNOSIS — M25561 Pain in right knee: Secondary | ICD-10-CM | POA: Diagnosis not present

## 2016-10-16 DIAGNOSIS — M25562 Pain in left knee: Secondary | ICD-10-CM | POA: Insufficient documentation

## 2016-10-16 NOTE — Therapy (Signed)
[facility] [place], [place], [address] Phone: [phone]   Fax:  [phone]  Physical Therapy Treatment  Patient Details  Name: Krystal [name] MRN: [id number] Date of Birth: [birth year] Referring Provider: Suella Broad MD.  Encounter Date: 10/16/2016      PT End of Session - 10/16/16 1534    Visit Number 1   Number of Visits 16   Date for PT Re-Evaluation 12/15/16   PT Start Time 0145   PT Stop Time 0241   PT Time Calculation (min) 56 min   Activity Tolerance Patient tolerated treatment well   Behavior During Therapy [facility] for tasks assessed/performed      Past Medical History:  Diagnosis Date  . Anemia   . Asthma   . Brain tumor ([place])    1986  . Breast cancer ([place])    Right breast  . Cataract   . Colon polyps   . COPD (chronic obstructive pulmonary disease) ([place])   . Hiatal hernia   . Hyperlipidemia   . Leg edema   . Macular degeneration   . PVD (posterior vitreous detachment)   . Right knee pain   . Shingles rash     Past Surgical History:  Procedure Laterality Date  . ABDOMINAL HYSTERECTOMY    . COLONOSCOPY    . CRANIECTOMY / CRANIOTOMY FOR EXCISION OF BRAIN TUMOR     Left side brain  . EYE SURGERY    . MASTECTOMY Right   . PARTIAL HYSTERECTOMY    . POLYPECTOMY    . SKIN CANCER EXCISION     Under left eye    There were no vitals filed for this visit.      Subjective Assessment - 10/16/16 1511    Subjective The patient reports ongoing low back pain ("deep burning") and worsenening bilateral knee pain over several years.  She reports her right knee is worse and hopes to get that better so she can walk again.  She use to walk a mile a day biut can no longer due to knee pain.  Her pain-level today is a 6/10 and can rise to 7-8/10 when doing "anything too long."  Nothing really decreases her pain.            [facility] PT Assessment - 10/16/16 0001      Assessment   Medical Diagnosis OA  of knees; degenerative lumbar disc.   Referring Provider Suella Broad MD.   Onset Date/Surgical Date --  Many years.     Precautions   Precautions Fall   Precaution Comments --  OP.  No active spinal ROM/loading.     Restrictions   Weight Bearing Restrictions No     Balance Screen   Has the patient fallen in the past 6 months Yes   How many times? --  1.   Has the patient had a decrease in activity level because of a fear of falling?  Yes   Is the patient reluctant to leave their home because of a fear of falling?  Yes     [place] residence     Prior Function   Level of Independence Independent     Posture/Postural Control   Posture/Postural Control Postural limitations   Postural Limitations Rounded Shoulders;Forward head;Flexed trunk   Posture Comments Left genu valgum.  Right lateral patellar tilt.     ROM / Strength   AROM / PROM / Strength AROM;Strength  AROM   Overall AROM Comments Bilateral SLR's= 45 degrees.  Full bilateral knee ROM.     Strength   Overall Strength Comments Bilateral hip abduction= 4/5; knee extsnion to 4 to 4+/5; bilateral knee flexion= 4/5.     Palpation   Palpation comment "Deep burning in spine"...lumbar.  Tender to palpation over right knee medial joint line region and at left Pes Anserine region.     Special Tests    Special Tests Lumbar;Leg LengthTest;Knee Special Tests  (-)SLR testing.   Leg length test  --  (=) leg lengths.   Knee Special tests  --  Bilateral valgus instability.     Ambulation/Gait   Gait Pattern Decreased step length - right;Decreased step length - left;Decreased stance time - right;Decreased stance time - left;Poor foot clearance - left;Poor foot clearance - right                     [facility] Adult PT Treatment/Exercise - 10/16/16 0001      Modalities   Modalities Electrical Stimulation;Moist Heat     Moist Heat Therapy   Number Minutes Moist Heat 15  Minutes   Moist Heat Location Knee     Electrical Stimulation   Electrical Stimulation Location Right knee medial joint line and left Pes Anserine.   Electrical Stimulation Action --  Pre-mod.   Electrical Stimulation Parameters 80-150 Hz x 15 minutes.   Electrical Stimulation Goals Pain                  PT Short Term Goals - 10/16/16 1535      PT SHORT TERM GOAL #1   Title STG's=LTG's.           PT Long Term Goals - 10/16/16 1532      PT LONG TERM GOAL #1   Title Independent with a HEP.   Time 8   Period Weeks   Status New     PT LONG TERM GOAL #2   Title Walk a mile for exercise with pain not > 3/10.   Time 8   Period Weeks   Status New     PT LONG TERM GOAL #3   Title Perform ADL's with pain not > 3/10.   Time 8   Period Weeks   Status New               Plan - 10/16/16 1526    Clinical Impression Statement The patient presents to OPPT with ongoing and worsening lumbar pain and worsening knee pain right > left.  She has  a deep burning in her spine.  She would most like to decrease her knee pain so she can walk again for exercise without pain or minimal pain.  Both her knee are unstable and she has bilateral hip and knee weakness.  Her limitation decrease her functional mobilty and ability to perform ADL's.  Patient will benefit from skilled physical therapy.   Rehab Potential Good   PT Frequency 2x / week   PT Duration 8 weeks   PT Treatment/Interventions ADLs/Self Care Home Management;Electrical Stimulation;Moist Heat;Ultrasound;Gait training;Functional mobility training;Patient/family education;Neuromuscular re-education;Therapeutic exercise;Therapeutic activities;Manual techniques;Passive range of motion;Vasopneumatic Device   PT Next Visit Plan Bilateral supine hamstring strecthing her HEP.  Pain-free bilateral quadriceps strengthening.  Bilateral hip abduction strengthening.  Draw-in and hip bridges.  Nustep.    Consulted and Agree with Plan  of Care Patient      Patient will benefit from skilled therapeutic intervention in order  to improve the following deficits and impairments:  Pain, Decreased activity tolerance, Decreased strength, Decreased range of motion, Decreased mobility  Visit Diagnosis: Chronic midline low back pain, with sciatica presence unspecified - Plan: PT plan of care cert/re-cert  Chronic pain of right knee - Plan: PT plan of care cert/re-cert  Chronic pain of left knee - Plan: PT plan of care cert/re-cert       G-Codes - 10-20-2016 1459    Functional Assessment Tool Used (Outpatient Only) FOTO....66% limitation.   Functional Limitation Mobility: Walking and moving around   Mobility: Walking and Moving Around Current Status [phone]) At least 60 percent but less than 80 percent impaired, limited or restricted   Mobility: Walking and Moving Around Goal Status [phone]) At least 40 percent but less than 60 percent impaired, limited or restricted      Problem List Patient Active Problem List   Diagnosis Date Noted  . Influenza with pneumonia 10/30/2015  . CAP (community acquired pneumonia) 10/29/2015  . Abdominal aortic atherosclerosis ([place]) 04/10/2015  . PSVT (paroxysmal supraventricular tachycardia) ([place]) 05/10/2014  . Macular degeneration, age related, nonexudative 08/24/2013  . History of right mastectomy 07/13/2013  . Anemia, iron deficiency 06/21/2013  . Allergic rhinitis 06/21/2013  . Chronic low back pain 06/21/2013  . Malignant neoplasm of female breast ([place]) 11/22/2007  . Mixed hyperlipidemia 11/22/2007  . Anxiety state 11/22/2007  . DEPRESSION 11/22/2007  . Asthma 11/22/2007  . GERD 11/22/2007  . GASTRITIS, WITH HEMORRHAGE 08/14/2007  . DUODENITIS, WITHOUT HEMORRHAGE 08/14/2007  . HIATAL HERNIA 08/14/2007  . 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    Khori Rosevear, [place] MPT October 20, 2016, 5:38 PM  [facility] [address] [place], [place],  [address] Phone: [phone]   Fax:  [phone]  Name: Krystal [name] MRN: [id number] Date of Birth: 1940/11/08

## 2016-10-22 ENCOUNTER — Encounter: Payer: Self-pay | Admitting: Physical Therapy

## 2016-10-22 ENCOUNTER — Ambulatory Visit: Payer: Medicare Other | Admitting: Physical Therapy

## 2016-10-22 DIAGNOSIS — M25562 Pain in left knee: Secondary | ICD-10-CM | POA: Diagnosis not present

## 2016-10-22 DIAGNOSIS — G8929 Other chronic pain: Secondary | ICD-10-CM

## 2016-10-22 DIAGNOSIS — M545 Low back pain: Principal | ICD-10-CM

## 2016-10-22 DIAGNOSIS — M25561 Pain in right knee: Secondary | ICD-10-CM | POA: Diagnosis not present

## 2016-10-22 NOTE — Therapy (Signed)
[facility] [place], [place], [address] Phone: [phone]   Fax:  [phone]  Physical Therapy Treatment  Patient Details  Name: Krystal [name] MRN: [id number] Date of Birth: January 06, 1941 Referring Provider: Suella Broad MD.  Encounter Date: 10/22/2016      PT End of Session - 10/22/16 1349    Visit Number 2   Number of Visits 16   Date for PT Re-Evaluation 12/15/16   PT Start Time 4709   PT Stop Time 1404   PT Time Calculation (min) 51 min   Activity Tolerance Patient tolerated treatment well   Behavior During Therapy [facility] for tasks assessed/performed      Past Medical History:  Diagnosis Date  . Anemia   . Asthma   . Brain tumor ([place])    1986  . Breast cancer ([place])    Right breast  . Cataract   . Colon polyps   . COPD (chronic obstructive pulmonary disease) ([place])   . Hiatal hernia   . Hyperlipidemia   . Leg edema   . Macular degeneration   . PVD (posterior vitreous detachment)   . Right knee pain   . Shingles rash     Past Surgical History:  Procedure Laterality Date  . ABDOMINAL HYSTERECTOMY    . COLONOSCOPY    . CRANIECTOMY / CRANIOTOMY FOR EXCISION OF BRAIN TUMOR     Left side brain  . EYE SURGERY    . MASTECTOMY Right   . PARTIAL HYSTERECTOMY    . POLYPECTOMY    . SKIN CANCER EXCISION     Under left eye    There were no vitals filed for this visit.      Subjective Assessment - 10/22/16 1315    Subjective Patient reported ongoing pain in back ann bil knees   Pertinent History OP.   Patient Stated Goals Be able to walk a mile for exercise again.   Currently in Pain? Yes   Pain Score 6    Pain Location Knee   Pain Orientation Right;Left  right gives way at times   Pain Descriptors / Indicators Aching   Pain Type Chronic pain   Pain Onset More than a month ago   Pain Frequency Constant   Aggravating Factors  any prolong activity   Pain Relieving Factors at rest   Pain Score 6    Pain Location Back   Pain Orientation Lower   Pain Descriptors / Indicators Burning   Pain Type Chronic pain   Pain Onset More than a month ago   Pain Frequency Intermittent   Aggravating Factors  any prolong position   Pain Relieving Factors at rest                         [facility] Adult PT Treatment/Exercise - 10/22/16 0001      Exercises   Exercises Lumbar;Knee/Hip     Lumbar Exercises: Supine   Ab Set 3 seconds;20 reps   Bridge 20 reps;3 seconds     Knee/Hip Exercises: Aerobic   Nustep L4 x45min, monitored for progression     Knee/Hip Exercises: Seated   Long Arc Quad Strengthening;Both;3 sets;10 reps;Limitations  right knee popping with exeecise   Long Arc Quad Weight 3 lbs.   Hamstring Curl Strengthening;Both;3 sets;10 reps  green t-band     Knee/Hip Exercises: Supine   Short Arc Quad Sets Strengthening;Both;3 sets;10 reps  with 3# and ball squeeze   Other  Supine Knee/Hip Exercises hip abd with red t-band  x30 each LE (one at a time)     Moist Heat Therapy   Number Minutes Moist Heat 15 Minutes   Moist Heat Location Knee     Electrical Stimulation   Electrical Stimulation Location Right knee medial joint line and left Pes Anserine.   Electrical Stimulation Action premod   Electrical Stimulation Parameters 80-150hz x33min   Electrical Stimulation Goals Pain                  PT Short Term Goals - 10/16/16 1535      PT SHORT TERM GOAL #1   Title STG's=LTG's.           PT Long Term Goals - 10/22/16 1350      PT LONG TERM GOAL #1   Title Independent with a HEP.   Time 8   Period Weeks   Status On-going     PT LONG TERM GOAL #2   Title Walk a mile for exercise with pain not > 3/10.   Period Weeks   Status On-going     PT LONG TERM GOAL #3   Title Perform ADL's with pain not > 3/10.   Time 8   Period Weeks   Status On-going               Plan - 10/22/16 1350    Clinical Impression Statement Patient tolerated  treatment well today. Patient had some reported popping in right knee with certain exercises and fear of instability. Educated patient on using an assistive device for safety and on posture awareness techniques to avoid further injury. Patient required cues for technique today with all exercises. Today focused on seated and supine exercises to allow a slow progression to strengthen core and stabilize back and knee's. Goals ongoing due to pain deficts.    Rehab Potential Good   PT Frequency 2x / week   PT Duration 8 weeks   PT Treatment/Interventions ADLs/Self Care Home Management;Electrical Stimulation;Moist Heat;Ultrasound;Gait training;Functional mobility training;Patient/family education;Neuromuscular re-education;Therapeutic exercise;Therapeutic activities;Manual techniques;Passive range of motion;Vasopneumatic Device   PT Next Visit Plan cont with POC forBilateral supine hamstring strecthing her HEP.  Pain-free bilateral quadriceps strengthening.  Bilateral hip abduction strengthening.  Draw-in and hip bridges.  Nustep.    Consulted and Agree with Plan of Care Patient      Patient will benefit from skilled therapeutic intervention in order to improve the following deficits and impairments:  Pain, Decreased activity tolerance, Decreased strength, Decreased range of motion, Decreased mobility  Visit Diagnosis: Chronic midline low back pain, with sciatica presence unspecified  Chronic pain of right knee  Chronic pain of left knee     Problem List Patient Active Problem List   Diagnosis Date Noted  . Influenza with pneumonia 10/30/2015  . CAP (community acquired pneumonia) 10/29/2015  . Abdominal aortic atherosclerosis ([place]) 04/10/2015  . PSVT (paroxysmal supraventricular tachycardia) ([place]) 05/10/2014  . Macular degeneration, age related, nonexudative 08/24/2013  . History of right mastectomy 07/13/2013  . Anemia, iron deficiency 06/21/2013  . Allergic rhinitis 06/21/2013  . Chronic  low back pain 06/21/2013  . Malignant neoplasm of female breast ([place]) 11/22/2007  . Mixed hyperlipidemia 11/22/2007  . Anxiety state 11/22/2007  . DEPRESSION 11/22/2007  . Asthma 11/22/2007  . GERD 11/22/2007  . GASTRITIS, WITH HEMORRHAGE 08/14/2007  . DUODENITIS, WITHOUT HEMORRHAGE 08/14/2007  . HIATAL HERNIA 08/14/2007  . 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    Macen Joslin P, PTA 10/22/2016, 2:04  PM  [facility] [place], [place], [address] Phone: [phone]   Fax:  [phone]  Name: Krystal [name] MRN: [id number] Date of Birth: 03-26-1941

## 2016-10-29 ENCOUNTER — Encounter: Payer: Self-pay | Admitting: Physical Therapy

## 2016-10-29 ENCOUNTER — Ambulatory Visit: Payer: Medicare Other | Admitting: Physical Therapy

## 2016-10-29 DIAGNOSIS — M25561 Pain in right knee: Secondary | ICD-10-CM

## 2016-10-29 DIAGNOSIS — M25562 Pain in left knee: Secondary | ICD-10-CM

## 2016-10-29 DIAGNOSIS — M545 Low back pain: Principal | ICD-10-CM

## 2016-10-29 DIAGNOSIS — G8929 Other chronic pain: Secondary | ICD-10-CM | POA: Diagnosis not present

## 2016-10-29 NOTE — Therapy (Signed)
[facility] [place], [place], [address] Phone: [phone]   Fax:  [phone]  Physical Therapy Treatment  Patient Details  Name: Krystal [name] MRN: [id number] Date of Birth: 09/21/1940 Referring Provider: Suella Broad MD.  Encounter Date: 10/29/2016      PT End of Session - 10/29/16 1146    Visit Number 3   Number of Visits 16   Date for PT Re-Evaluation 12/15/16   PT Start Time 1115   PT Stop Time 1201   PT Time Calculation (min) 46 min   Activity Tolerance Patient tolerated treatment well   Behavior During Therapy [facility] for tasks assessed/performed      Past Medical History:  Diagnosis Date  . Anemia   . Asthma   . Brain tumor ([place])    1986  . Breast cancer ([place])    Right breast  . Cataract   . Colon polyps   . COPD (chronic obstructive pulmonary disease) ([place])   . Hiatal hernia   . Hyperlipidemia   . Leg edema   . Macular degeneration   . PVD (posterior vitreous detachment)   . Right knee pain   . Shingles rash     Past Surgical History:  Procedure Laterality Date  . ABDOMINAL HYSTERECTOMY    . COLONOSCOPY    . CRANIECTOMY / CRANIOTOMY FOR EXCISION OF BRAIN TUMOR     Left side brain  . EYE SURGERY    . MASTECTOMY Right   . PARTIAL HYSTERECTOMY    . POLYPECTOMY    . SKIN CANCER EXCISION     Under left eye    There were no vitals filed for this visit.      Subjective Assessment - 10/29/16 1117    Subjective Patient reported feeling "wobbly" today for unknown reason, and ongoing pain   Pertinent History OP.   Patient Stated Goals Be able to walk a mile for exercise again.   Currently in Pain? Yes   Pain Score 8    Pain Location Knee   Pain Orientation Right;Left  right more than left   Pain Descriptors / Indicators Aching   Pain Type Surgical pain   Pain Onset More than a month ago   Pain Frequency Constant   Aggravating Factors  any prolong activity   Pain Relieving Factors  at rest                         [facility] Adult PT Treatment/Exercise - 10/29/16 0001      Lumbar Exercises: Supine   Bridge 20 reps;3 seconds     Knee/Hip Exercises: Aerobic   Nustep L4 x77min, monitored for progression     Knee/Hip Exercises: Seated   Long Arc Quad Strengthening;Both;3 sets;10 reps;Limitations   Long Arc Quad Weight 3 lbs.   Hamstring Curl Strengthening;Both;3 sets;10 reps  green t-band     Knee/Hip Exercises: Supine   Other Supine Knee/Hip Exercises hip abd with red t-band  x30 each LE (one at a time)     Moist Heat Therapy   Number Minutes Moist Heat 15 Minutes   Moist Heat Location Knee     Electrical Stimulation   Electrical Stimulation Location Right knee medial joint line and left Pes Anserine.   Electrical Stimulation Action premod   Electrical Stimulation Parameters 80-150hz  x98min   Electrical Stimulation Goals Pain  PT Short Term Goals - 10/16/16 1535      PT SHORT TERM GOAL #1   Title STG's=LTG's.           PT Long Term Goals - 10/22/16 1350      PT LONG TERM GOAL #1   Title Independent with a HEP.   Time 8   Period Weeks   Status On-going     PT LONG TERM GOAL #2   Title Walk a mile for exercise with pain not > 3/10.   Period Weeks   Status On-going     PT LONG TERM GOAL #3   Title Perform ADL's with pain not > 3/10.   Time 8   Period Weeks   Status On-going               Plan - 10/29/16 1149    Clinical Impression Statement Patient tolerated treatment well today. Patient continues to have more pain in right knee up to 6-8/10. Today focused on supine and seated exercises. Patient may be ready to progress to standing next week. Patient current goals ongoing due to pain deficts.   Rehab Potential Good   PT Frequency 2x / week   PT Duration 8 weeks   PT Treatment/Interventions ADLs/Self Care Home Management;Electrical Stimulation;Moist Heat;Ultrasound;Gait training;Functional  mobility training;Patient/family education;Neuromuscular re-education;Therapeutic exercise;Therapeutic activities;Manual techniques;Passive range of motion;Vasopneumatic Device   PT Next Visit Plan cont with POC for Pain-free bilateral quadriceps/hip and core strengthening   Consulted and Agree with Plan of Care Patient      Patient will benefit from skilled therapeutic intervention in order to improve the following deficits and impairments:  Pain, Decreased activity tolerance, Decreased strength, Decreased range of motion, Decreased mobility  Visit Diagnosis: Chronic midline low back pain, with sciatica presence unspecified  Chronic pain of right knee  Chronic pain of left knee     Problem List Patient Active Problem List   Diagnosis Date Noted  . Influenza with pneumonia 10/30/2015  . CAP (community acquired pneumonia) 10/29/2015  . Abdominal aortic atherosclerosis ([place]) 04/10/2015  . PSVT (paroxysmal supraventricular tachycardia) ([place]) 05/10/2014  . Macular degeneration, age related, nonexudative 08/24/2013  . History of right mastectomy 07/13/2013  . Anemia, iron deficiency 06/21/2013  . Allergic rhinitis 06/21/2013  . Chronic low back pain 06/21/2013  . Malignant neoplasm of female breast ([place]) 11/22/2007  . Mixed hyperlipidemia 11/22/2007  . Anxiety state 11/22/2007  . DEPRESSION 11/22/2007  . Asthma 11/22/2007  . GERD 11/22/2007  . GASTRITIS, WITH HEMORRHAGE 08/14/2007  . DUODENITIS, WITHOUT HEMORRHAGE 08/14/2007  . HIATAL HERNIA 08/14/2007  . 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    Phillips Climes, PTA 10/29/2016, 12:01 PM  [facility] [address] [place], [place], [address] Phone: [phone]   Fax:  [phone]  Name: Krystal [name] MRN: [id number] Date of Birth: 05-25-1941

## 2016-11-05 DIAGNOSIS — M1712 Unilateral primary osteoarthritis, left knee: Secondary | ICD-10-CM | POA: Diagnosis not present

## 2016-11-05 DIAGNOSIS — M1711 Unilateral primary osteoarthritis, right knee: Secondary | ICD-10-CM | POA: Diagnosis not present

## 2016-11-05 DIAGNOSIS — M7051 Other bursitis of knee, right knee: Secondary | ICD-10-CM | POA: Diagnosis not present

## 2016-11-06 ENCOUNTER — Ambulatory Visit: Payer: Medicare Other | Attending: Physical Medicine and Rehabilitation | Admitting: Physical Therapy

## 2016-11-06 ENCOUNTER — Encounter: Payer: Self-pay | Admitting: Physical Therapy

## 2016-11-06 DIAGNOSIS — M25562 Pain in left knee: Secondary | ICD-10-CM | POA: Diagnosis not present

## 2016-11-06 DIAGNOSIS — G8929 Other chronic pain: Secondary | ICD-10-CM | POA: Diagnosis not present

## 2016-11-06 DIAGNOSIS — M25561 Pain in right knee: Secondary | ICD-10-CM | POA: Insufficient documentation

## 2016-11-06 DIAGNOSIS — M545 Low back pain: Secondary | ICD-10-CM | POA: Insufficient documentation

## 2016-11-06 NOTE — Therapy (Signed)
[facility] [place], [place], [address] Phone: [phone]   Fax:  [phone]  Physical Therapy Treatment  Patient Details  Name: Krystal [name] MRN: [id number] Date of Birth: [birth year] Referring Provider: Suella Broad MD.  Encounter Date: 11/06/2016      PT End of Session - 11/06/16 1255    Visit Number 4   Number of Visits 16   Date for PT Re-Evaluation 12/15/16   PT Start Time 1230   PT Stop Time 5465   PT Time Calculation (min) 47 min   Activity Tolerance Patient tolerated treatment well   Behavior During Therapy [facility] for tasks assessed/performed      Past Medical History:  Diagnosis Date  . Anemia   . Asthma   . Brain tumor ([place])    1986  . Breast cancer ([place])    Right breast  . Cataract   . Colon polyps   . COPD (chronic obstructive pulmonary disease) ([place])   . Hiatal hernia   . Hyperlipidemia   . Leg edema   . Macular degeneration   . PVD (posterior vitreous detachment)   . Right knee pain   . Shingles rash     Past Surgical History:  Procedure Laterality Date  . ABDOMINAL HYSTERECTOMY    . COLONOSCOPY    . CRANIECTOMY / CRANIOTOMY FOR EXCISION OF BRAIN TUMOR     Left side brain  . EYE SURGERY    . MASTECTOMY Right   . PARTIAL HYSTERECTOMY    . POLYPECTOMY    . SKIN CANCER EXCISION     Under left eye    There were no vitals filed for this visit.      Subjective Assessment - 11/06/16 1234    Subjective Patient reported feeling "wobbly" today for unknown reason, and ongoing pain   Pertinent History OP.   Patient Stated Goals Be able to walk a mile for exercise again.   Currently in Pain? Yes   Pain Score 8    Pain Location Knee   Pain Orientation Right;Left   Pain Descriptors / Indicators Aching   Pain Type Surgical pain   Pain Onset More than a month ago   Pain Frequency Constant   Aggravating Factors  any prolong activity   Pain Relieving Factors at rest                          [facility] Adult PT Treatment/Exercise - 11/06/16 0001      Lumbar Exercises: Supine   Bridge 20 reps;3 seconds     Knee/Hip Exercises: Aerobic   Nustep L4 x55min, monitored for progression     Knee/Hip Exercises: Seated   Long Arc Quad Strengthening;Both;3 sets;10 reps;Limitations   Long Arc Quad Weight 3 lbs.   Hamstring Curl Strengthening;Both;3 sets;10 reps  green t-band on left/Yellow on right LE     Knee/Hip Exercises: Supine   Short Arc Quad Sets Strengthening;Both;3 sets;10 reps  3#   Other Supine Knee/Hip Exercises hip abd with red t-band  x30 each LE (one at a time)     Moist Heat Therapy   Number Minutes Moist Heat 15 Minutes   Moist Heat Location Knee     Electrical Stimulation   Electrical Stimulation Location Right knee medial joint line and left Pes Anserine.   Electrical Stimulation Action premod   Electrical Stimulation Parameters 80-150hz  x69min   Electrical Stimulation Goals Pain  PT Short Term Goals - 10/16/16 1535      PT SHORT TERM GOAL #1   Title STG's=LTG's.           PT Long Term Goals - 10/22/16 1350      PT LONG TERM GOAL #1   Title Independent with a HEP.   Time 8   Period Weeks   Status On-going     PT LONG TERM GOAL #2   Title Walk a mile for exercise with pain not > 3/10.   Period Weeks   Status On-going     PT LONG TERM GOAL #3   Title Perform ADL's with pain not > 3/10.   Time 8   Period Weeks   Status On-going               Plan - 11/06/16 1303    Clinical Impression Statement Patient tolerated treatment well today yet with ongoing pain in right knee. Patient required supine and seated exercises due to pain. Patient may be able to try standing exercises next week depending on pain level. Patient went to MD and is to continue 4 more weeks. Goals ongoing due to pain deficts.    Rehab Potential Good   PT Frequency 2x / week   PT Duration 8 weeks   PT  Treatment/Interventions ADLs/Self Care Home Management;Electrical Stimulation;Moist Heat;Ultrasound;Gait training;Functional mobility training;Patient/family education;Neuromuscular re-education;Therapeutic exercise;Therapeutic activities;Manual techniques;Passive range of motion;Vasopneumatic Device   PT Next Visit Plan cont with POC for Pain-free bilateral quadriceps/hip and core strengthening   Consulted and Agree with Plan of Care Patient      Patient will benefit from skilled therapeutic intervention in order to improve the following deficits and impairments:  Pain, Decreased activity tolerance, Decreased strength, Decreased range of motion, Decreased mobility  Visit Diagnosis: Chronic midline low back pain, with sciatica presence unspecified  Chronic pain of right knee  Chronic pain of left knee     Problem List Patient Active Problem List   Diagnosis Date Noted  . Influenza with pneumonia 10/30/2015  . CAP (community acquired pneumonia) 10/29/2015  . Abdominal aortic atherosclerosis ([place]) 04/10/2015  . PSVT (paroxysmal supraventricular tachycardia) ([place]) 05/10/2014  . Macular degeneration, age related, nonexudative 08/24/2013  . History of right mastectomy 07/13/2013  . Anemia, iron deficiency 06/21/2013  . Allergic rhinitis 06/21/2013  . Chronic low back pain 06/21/2013  . Malignant neoplasm of female breast ([place]) 11/22/2007  . Mixed hyperlipidemia 11/22/2007  . Anxiety state 11/22/2007  . DEPRESSION 11/22/2007  . Asthma 11/22/2007  . GERD 11/22/2007  . GASTRITIS, WITH HEMORRHAGE 08/14/2007  . DUODENITIS, WITHOUT HEMORRHAGE 08/14/2007  . HIATAL HERNIA 08/14/2007  . 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    Llewyn Heap P, PTA 11/06/2016, 1:17 PM  [facility] [place], [place], [address] Phone: [phone]   Fax:  [phone]  Name: SUSSIE MINOR MRN: [id number] Date of Birth: 1940-09-01

## 2016-11-07 ENCOUNTER — Telehealth: Payer: Self-pay | Admitting: Family Medicine

## 2016-11-12 ENCOUNTER — Telehealth: Payer: Self-pay | Admitting: Family Medicine

## 2016-11-12 NOTE — Telephone Encounter (Signed)
What is the name of the medication? dizepam  Have you contacted your pharmacy to request a refill? yes  Which pharmacy would you like this sent to? [facility], called pharmacy on Monday to have refill request sent   Patient notified that their request is being sent to the clinical staff for review and that they should receive a call once it is complete. If they do not receive a call within 24 hours they can check with their pharmacy or our office.

## 2016-11-13 DIAGNOSIS — M5136 Other intervertebral disc degeneration, lumbar region: Secondary | ICD-10-CM | POA: Diagnosis not present

## 2016-11-13 NOTE — Telephone Encounter (Signed)
Pt notified she should have refills Confirmed with [facility]

## 2016-11-14 ENCOUNTER — Ambulatory Visit: Payer: Medicare Other | Admitting: Physical Therapy

## 2016-11-14 ENCOUNTER — Encounter: Payer: Self-pay | Admitting: Physical Therapy

## 2016-11-14 DIAGNOSIS — G8929 Other chronic pain: Secondary | ICD-10-CM

## 2016-11-14 DIAGNOSIS — M25562 Pain in left knee: Secondary | ICD-10-CM | POA: Diagnosis not present

## 2016-11-14 DIAGNOSIS — M25561 Pain in right knee: Secondary | ICD-10-CM

## 2016-11-14 DIAGNOSIS — M545 Low back pain: Principal | ICD-10-CM

## 2016-11-14 NOTE — Therapy (Signed)
[facility] [place], [place], [address] Phone: [phone]   Fax:  [phone]  Physical Therapy Treatment  Patient Details  Name: Krystal [name] MRN: [id number] Date of Birth: Jun 08, 1941 Referring Provider: Suella Broad, MD  Encounter Date: 11/14/2016      PT End of Session - 11/14/16 1128    Visit Number 5   Number of Visits 16   Date for PT Re-Evaluation 12/15/16   PT Start Time 1115   PT Stop Time 1200   PT Time Calculation (min) 45 min   Activity Tolerance Patient tolerated treatment well   Behavior During Therapy [facility] for tasks assessed/performed      Past Medical History:  Diagnosis Date  . Anemia   . Asthma   . Brain tumor ([place])    1986  . Breast cancer ([place])    Right breast  . Cataract   . Colon polyps   . COPD (chronic obstructive pulmonary disease) ([place])   . Hiatal hernia   . Hyperlipidemia   . Leg edema   . Macular degeneration   . PVD (posterior vitreous detachment)   . Right knee pain   . Shingles rash     Past Surgical History:  Procedure Laterality Date  . ABDOMINAL HYSTERECTOMY    . COLONOSCOPY    . CRANIECTOMY / CRANIOTOMY FOR EXCISION OF BRAIN TUMOR     Left side brain  . EYE SURGERY    . MASTECTOMY Right   . PARTIAL HYSTERECTOMY    . POLYPECTOMY    . SKIN CANCER EXCISION     Under left eye    There were no vitals filed for this visit.      Subjective Assessment - 11/14/16 1123    Subjective Patient arriving to therapy complaining of 5/10 pain in her R knee. Pt reporting her knees always hurt.    Pertinent History OP.   Patient Stated Goals Be able to walk a mile for exercise again.   Currently in Pain? Yes   Pain Score 5    Pain Location Knee   Pain Orientation Right   Pain Descriptors / Indicators Aching   Pain Type Chronic pain   Pain Onset More than a month ago   Pain Frequency Constant   Aggravating Factors  prolonged walking, standing, household chores   Pain Relieving Factors resting            [facility] PT Assessment - 11/14/16 0001      Assessment   Medical Diagnosis OA of knees; degenerative lumbar disc.   Referring Provider Suella Broad, MD     Precautions   Precautions Fall     Restrictions   Weight Bearing Restrictions No     Balance Screen   Has the patient fallen in the past 6 months Yes   How many times? 1   Has the patient had a decrease in activity level because of a fear of falling?  Yes   Is the patient reluctant to leave their home because of a fear of falling?  Yes                     [facility] Adult PT Treatment/Exercise - 11/14/16 0001      Exercises   Exercises Lumbar;Knee/Hip     Lumbar Exercises: Supine   Clam 15 reps;2 seconds   Bridge 20 reps;3 seconds     Knee/Hip Exercises: Aerobic   Nustep L4 x53min, monitored for progression  Knee/Hip Exercises: Supine   Short Arc Quad Sets Strengthening;Both;3 sets;10 reps  3#     Knee/Hip Exercises: Sidelying   Hip ABduction Strengthening;2 sets;10 reps   Hip ABduction Limitations instructions for technique and form to keep LE in alignment     Moist Heat Therapy   Number Minutes Moist Heat 15 Minutes   Moist Heat Location Knee  bilateral     Electrical Stimulation   Electrical Stimulation Location R knee   Electrical Stimulation Action IFC   Electrical Stimulation Parameters 80-150 Hz x 15 minutes   Electrical Stimulation Goals Pain                PT Education - 11/14/16 1125    Education provided Yes   Education Details gradual walking program. Pt reporting she doesn't have a good place to walk at home and recommendation was make to walk in place in her home.    Person(s) Educated Patient   Methods Explanation   Comprehension Verbalized understanding;Returned demonstration          PT Short Term Goals - 10/16/16 1535      PT SHORT TERM GOAL #1   Title STG's=LTG's.           PT Long Term Goals - 11/14/16 1131       PT LONG TERM GOAL #1   Title Independent with a HEP.   Time 8   Period Weeks   Status On-going     PT LONG TERM GOAL #2   Title Walk a mile for exercise with pain not > 3/10.   Time 8   Period Weeks   Status On-going     PT LONG TERM GOAL #3   Title Perform ADL's with pain not > 3/10.   Time 8   Period Weeks   Status On-going               Plan - 11/14/16 1128    Clinical Impression Statement Patient tolerating treatment well today. Pt arriving with 5/10 R knee pain. Pt reported decreased pain at end of session. Pt with noted bilateral ankle swelling.  Pt tolerated all exercises well with instructions for each for technique and form. We will continue to progress pt's functional mobility toward LTG's set.    Rehab Potential Good   PT Frequency 2x / week   PT Duration 8 weeks   PT Treatment/Interventions ADLs/Self Care Home Management;Electrical Stimulation;Moist Heat;Ultrasound;Gait training;Functional mobility training;Patient/family education;Neuromuscular re-education;Therapeutic exercise;Therapeutic activities;Manual techniques;Passive range of motion;Vasopneumatic Device   PT Next Visit Plan cont with POC for Pain-free bilateral quadriceps/hip and core strengthening, standing exercises   PT Home Exercise Plan marching in place to build endurance and begin walking program   Consulted and Agree with Plan of Care Patient      Patient will benefit from skilled therapeutic intervention in order to improve the following deficits and impairments:  Pain, Decreased activity tolerance, Decreased strength, Decreased range of motion, Decreased mobility  Visit Diagnosis: Chronic midline low back pain, with sciatica presence unspecified  Chronic pain of right knee  Chronic pain of left knee     Problem List Patient Active Problem List   Diagnosis Date Noted  . Influenza with pneumonia 10/30/2015  . CAP (community acquired pneumonia) 10/29/2015  . Abdominal aortic  atherosclerosis ([place]) 04/10/2015  . PSVT (paroxysmal supraventricular tachycardia) ([place]) 05/10/2014  . Macular degeneration, age related, nonexudative 08/24/2013  . History of right mastectomy 07/13/2013  . Anemia, iron deficiency 06/21/2013  .  Allergic rhinitis 06/21/2013  . Chronic low back pain 06/21/2013  . Malignant neoplasm of female breast ([place]) 11/22/2007  . Mixed hyperlipidemia 11/22/2007  . Anxiety state 11/22/2007  . DEPRESSION 11/22/2007  . Asthma 11/22/2007  . GERD 11/22/2007  . GASTRITIS, WITH HEMORRHAGE 08/14/2007  . DUODENITIS, WITHOUT HEMORRHAGE 08/14/2007  . HIATAL HERNIA 08/14/2007  . 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    Oretha Caprice, MPT  11/14/2016, 11:51 AM  [facility] [address] [place], [place], [address] Phone: [phone]   Fax:  [phone]  Name: Krystal [name] MRN: [id number] Date of Birth: 06/01/1941

## 2016-11-17 NOTE — Telephone Encounter (Signed)
On [employer] for [employer]

## 2016-11-20 ENCOUNTER — Encounter: Payer: Self-pay | Admitting: Physical Therapy

## 2016-11-20 ENCOUNTER — Ambulatory Visit: Payer: Medicare Other | Admitting: Physical Therapy

## 2016-11-20 DIAGNOSIS — G8929 Other chronic pain: Secondary | ICD-10-CM | POA: Diagnosis not present

## 2016-11-20 DIAGNOSIS — M25562 Pain in left knee: Secondary | ICD-10-CM | POA: Diagnosis not present

## 2016-11-20 DIAGNOSIS — M25561 Pain in right knee: Secondary | ICD-10-CM

## 2016-11-20 DIAGNOSIS — M545 Low back pain: Secondary | ICD-10-CM | POA: Diagnosis not present

## 2016-11-20 NOTE — Therapy (Signed)
[facility] [place], [place], [address] Phone: [phone]   Fax:  [phone]  Physical Therapy Treatment  Patient Details  Name: Krystal [name] MRN: [id number] Date of Birth: [birth year] Referring Provider: Suella Broad, MD  Encounter Date: 11/20/2016      PT End of Session - 11/20/16 1513    Visit Number 6   Number of Visits 16   Date for PT Re-Evaluation 12/15/16   PT Start Time 5852   PT Stop Time 1528   PT Time Calculation (min) 42 min   Activity Tolerance Patient tolerated treatment well   Behavior During Therapy [facility] for tasks assessed/performed      Past Medical History:  Diagnosis Date  . Anemia   . Asthma   . Brain tumor ([place])    1986  . Breast cancer ([place])    Right breast  . Cataract   . Colon polyps   . COPD (chronic obstructive pulmonary disease) ([place])   . Hiatal hernia   . Hyperlipidemia   . Leg edema   . Macular degeneration   . PVD (posterior vitreous detachment)   . Right knee pain   . Shingles rash     Past Surgical History:  Procedure Laterality Date  . ABDOMINAL HYSTERECTOMY    . COLONOSCOPY    . CRANIECTOMY / CRANIOTOMY FOR EXCISION OF BRAIN TUMOR     Left side brain  . EYE SURGERY    . MASTECTOMY Right   . PARTIAL HYSTERECTOMY    . POLYPECTOMY    . SKIN CANCER EXCISION     Under left eye    There were no vitals filed for this visit.      Subjective Assessment - 11/20/16 1452    Subjective Patient reported her right knee feeling like it wants to "give way" more this past week for unknown reason. Educated patient on use of cane to avoid a fall. Patient to call MD for follow up.   Pertinent History OP.   Patient Stated Goals Be able to walk a mile for exercise again.   Currently in Pain? Yes   Pain Score 5    Pain Location Knee   Pain Orientation Right   Pain Descriptors / Indicators Aching   Pain Type Chronic pain   Pain Onset More than a month ago   Pain  Frequency Constant   Aggravating Factors  prolong activity   Pain Relieving Factors at rest   Pain Score 6   Pain Location Back   Pain Orientation Lower   Pain Descriptors / Indicators Burning   Pain Type Chronic pain   Pain Onset More than a month ago   Pain Frequency Intermittent   Aggravating Factors  any one prolong position   Pain Relieving Factors at rest                         [facility] Adult PT Treatment/Exercise - 11/20/16 0001      Lumbar Exercises: Supine   Bridge 20 reps;3 seconds     Knee/Hip Exercises: Aerobic   Nustep L5 x23min, monitored for progression     Knee/Hip Exercises: Seated   Long Arc Quad Strengthening;Both;3 sets;10 reps;Limitations   Long Arc Quad Weight 3 lbs.     Knee/Hip Exercises: Supine   Short Arc Quad Sets Strengthening;Both;3 sets;10 reps  3#   Other Supine Knee/Hip Exercises hip abd with red t-band  x30 each   Other Supine  Knee/Hip Exercises add squeeze x30      Moist Heat Therapy   Number Minutes Moist Heat 15 Minutes   Moist Heat Location Knee     Electrical Stimulation   Electrical Stimulation Location R knee   Electrical Stimulation Action IFC   Electrical Stimulation Parameters 80-150hz  x66min   Electrical Stimulation Goals Pain                  PT Short Term Goals - 10/16/16 1535      PT SHORT TERM GOAL #1   Title STG's=LTG's.           PT Long Term Goals - 11/14/16 1131      PT LONG TERM GOAL #1   Title Independent with a HEP.   Time 8   Period Weeks   Status On-going     PT LONG TERM GOAL #2   Title Walk a mile for exercise with pain not > 3/10.   Time 8   Period Weeks   Status On-going     PT LONG TERM GOAL #3   Title Perform ADL's with pain not > 3/10.   Time 8   Period Weeks   Status On-going               Plan - 11/20/16 1514    Clinical Impression Statement Patient tolerated treatment well. Patient has reported more episodes of knee giving away more than usual  for unkown reason. Patient has ongoing pain and increases with prolong activity. Today focused on seated and supine strengthening for patient comfort. Goals ongoing due to pain and strength deficts.    Rehab Potential Good   PT Frequency 2x / week   PT Duration 8 weeks   PT Treatment/Interventions ADLs/Self Care Home Management;Electrical Stimulation;Moist Heat;Ultrasound;Gait training;Functional mobility training;Patient/family education;Neuromuscular re-education;Therapeutic exercise;Therapeutic activities;Manual techniques;Passive range of motion;Vasopneumatic Device   PT Next Visit Plan cont with POC for Pain-free bilateral quadriceps/hip and core strengthening, standing exercises   Consulted and Agree with Plan of Care Patient      Patient will benefit from skilled therapeutic intervention in order to improve the following deficits and impairments:  Pain, Decreased activity tolerance, Decreased strength, Decreased range of motion, Decreased mobility  Visit Diagnosis: Chronic midline low back pain, with sciatica presence unspecified  Chronic pain of right knee  Chronic pain of left knee     Problem List Patient Active Problem List   Diagnosis Date Noted  . Influenza with pneumonia 10/30/2015  . CAP (community acquired pneumonia) 10/29/2015  . Abdominal aortic atherosclerosis ([place]) 04/10/2015  . PSVT (paroxysmal supraventricular tachycardia) ([place]) 05/10/2014  . Macular degeneration, age related, nonexudative 08/24/2013  . History of right mastectomy 07/13/2013  . Anemia, iron deficiency 06/21/2013  . Allergic rhinitis 06/21/2013  . Chronic low back pain 06/21/2013  . Malignant neoplasm of female breast ([place]) 11/22/2007  . Mixed hyperlipidemia 11/22/2007  . Anxiety state 11/22/2007  . DEPRESSION 11/22/2007  . Asthma 11/22/2007  . GERD 11/22/2007  . GASTRITIS, WITH HEMORRHAGE 08/14/2007  . DUODENITIS, WITHOUT HEMORRHAGE 08/14/2007  . HIATAL HERNIA 08/14/2007  . ADENOMATOUS  COLONIC POLYP 02/23/2007    Juliocesar Blasius P, PTA 11/20/2016, 3:29 PM  [facility] [place], [place], [address] Phone: [phone]   Fax:  [phone]  Name: Krystal [name] MRN: [id number] Date of Birth: 14-Jun-1941

## 2016-11-26 ENCOUNTER — Encounter: Payer: Self-pay | Admitting: Physical Therapy

## 2016-11-26 ENCOUNTER — Ambulatory Visit: Payer: Medicare Other | Admitting: Physical Therapy

## 2016-11-26 DIAGNOSIS — M25562 Pain in left knee: Secondary | ICD-10-CM | POA: Diagnosis not present

## 2016-11-26 DIAGNOSIS — M25561 Pain in right knee: Secondary | ICD-10-CM

## 2016-11-26 DIAGNOSIS — M545 Low back pain: Principal | ICD-10-CM

## 2016-11-26 DIAGNOSIS — G8929 Other chronic pain: Secondary | ICD-10-CM

## 2016-11-26 NOTE — Therapy (Signed)
[facility] [place], [place], [address] Phone: [phone]   Fax:  [phone]  Physical Therapy Treatment  Patient Details  Name: Krystal [name] MRN: [id number] Date of Birth: 09/30/1940 Referring Provider: Suella Broad, MD  Encounter Date: 11/26/2016      PT End of Session - 11/26/16 1256    Visit Number 7   Number of Visits 16   Date for PT Re-Evaluation 12/15/16   PT Start Time 3662   PT Stop Time 1326   PT Time Calculation (min) 56 min   Activity Tolerance Patient tolerated treatment well   Behavior During Therapy [facility] for tasks assessed/performed      Past Medical History:  Diagnosis Date  . Anemia   . Asthma   . Brain tumor ([place])    1986  . Breast cancer ([place])    Right breast  . Cataract   . Colon polyps   . COPD (chronic obstructive pulmonary disease) ([place])   . Hiatal hernia   . Hyperlipidemia   . Leg edema   . Macular degeneration   . PVD (posterior vitreous detachment)   . Right knee pain   . Shingles rash     Past Surgical History:  Procedure Laterality Date  . ABDOMINAL HYSTERECTOMY    . COLONOSCOPY    . CRANIECTOMY / CRANIOTOMY FOR EXCISION OF BRAIN TUMOR     Left side brain  . EYE SURGERY    . MASTECTOMY Right   . PARTIAL HYSTERECTOMY    . POLYPECTOMY    . SKIN CANCER EXCISION     Under left eye    There were no vitals filed for this visit.      Subjective Assessment - 11/26/16 1232    Subjective Patient arrived and reported no change today. No complaints after last treatment   Pertinent History OP.   Patient Stated Goals Be able to walk a mile for exercise again.   Currently in Pain? Yes   Pain Score 5    Pain Location Knee   Pain Orientation Right   Pain Descriptors / Indicators Aching   Pain Type Chronic pain   Pain Frequency Constant   Aggravating Factors  prolong activity   Pain Relieving Factors at rest                         [facility] Adult PT  Treatment/Exercise - 11/26/16 0001      Lumbar Exercises: Supine   Bridge 20 reps;3 seconds     Knee/Hip Exercises: Aerobic   Nustep L5 x85min, monitored for progression     Knee/Hip Exercises: Standing   Knee Flexion Strengthening;Both;2 sets;10 reps   Hip ADduction Strengthening;Both;2 sets;10 reps   Forward Step Up Both;2 sets;10 reps;Step Height: 4"     Knee/Hip Exercises: Seated   Long Arc Quad Strengthening;Both;3 sets;10 reps;Limitations   Long Arc Quad Weight 3 lbs.     Knee/Hip Exercises: Supine   Short Arc Quad Sets Strengthening;Both;3 sets;10 reps  3#   Straight Leg Raises Strengthening;Both;2 sets;10 reps   Other Supine Knee/Hip Exercises hip abd with red t-band  x30 each   Other Supine Knee/Hip Exercises add squeeze x30 with SAQ 3#     Knee/Hip Exercises: Sidelying   Hip ABduction Strengthening;2 sets;10 reps;Right   Hip ABduction Limitations small range due to weakness   Clams 2x10     Moist Heat Therapy   Number Minutes Moist Heat 15 Minutes  Moist Heat Location Knee     Electrical Stimulation   Electrical Stimulation Location R knee   Electrical Stimulation Action IFC   Electrical Stimulation Parameters 80-150hz  x32min   Electrical Stimulation Goals Pain                  PT Short Term Goals - 10/16/16 1535      PT SHORT TERM GOAL #1   Title STG's=LTG's.           PT Long Term Goals - 11/14/16 1131      PT LONG TERM GOAL #1   Title Independent with a HEP.   Time 8   Period Weeks   Status On-going     PT LONG TERM GOAL #2   Title Walk a mile for exercise with pain not > 3/10.   Time 8   Period Weeks   Status On-going     PT LONG TERM GOAL #3   Title Perform ADL's with pain not > 3/10.   Time 8   Period Weeks   Status On-going               Plan - 11/26/16 1259    Clinical Impression Statement Patient tolerated treatment well today. Patient able to progress to standing exercises today with little fatigue yet  some pain with prolong standing. Patient has more pain complaints in right knee and reports where knee wants to buckle. Current goals ongoing due to pain and strength deficits.    Rehab Potential Good   PT Frequency 2x / week   PT Duration 8 weeks   PT Treatment/Interventions ADLs/Self Care Home Management;Electrical Stimulation;Moist Heat;Ultrasound;Gait training;Functional mobility training;Patient/family education;Neuromuscular re-education;Therapeutic exercise;Therapeutic activities;Manual techniques;Passive range of motion;Vasopneumatic Device   PT Next Visit Plan cont with POC for Pain-free bilateral quadriceps/hip and core strengthening, standing exercises   Consulted and Agree with Plan of Care Patient      Patient will benefit from skilled therapeutic intervention in order to improve the following deficits and impairments:  Pain, Decreased activity tolerance, Decreased strength, Decreased range of motion, Decreased mobility  Visit Diagnosis: Chronic midline low back pain, with sciatica presence unspecified  Chronic pain of right knee  Chronic pain of left knee     Problem List Patient Active Problem List   Diagnosis Date Noted  . Influenza with pneumonia 10/30/2015  . CAP (community acquired pneumonia) 10/29/2015  . Abdominal aortic atherosclerosis ([place]) 04/10/2015  . PSVT (paroxysmal supraventricular tachycardia) ([place]) 05/10/2014  . Macular degeneration, age related, nonexudative 08/24/2013  . History of right mastectomy 07/13/2013  . Anemia, iron deficiency 06/21/2013  . Allergic rhinitis 06/21/2013  . Chronic low back pain 06/21/2013  . Malignant neoplasm of female breast ([place]) 11/22/2007  . Mixed hyperlipidemia 11/22/2007  . Anxiety state 11/22/2007  . DEPRESSION 11/22/2007  . Asthma 11/22/2007  . GERD 11/22/2007  . GASTRITIS, WITH HEMORRHAGE 08/14/2007  . DUODENITIS, WITHOUT HEMORRHAGE 08/14/2007  . HIATAL HERNIA 08/14/2007  . ADENOMATOUS COLONIC POLYP  02/23/2007    DUNFORD, CHRISTINA P, PTA 11/26/2016, 1:27 PM  [facility] [place], [place], [address] Phone: [phone]   Fax:  [phone]  Name: Krystal [name] MRN: [id number] Date of Birth: [birth year]

## 2016-11-29 ENCOUNTER — Other Ambulatory Visit: Payer: Self-pay | Admitting: Family Medicine

## 2016-12-01 ENCOUNTER — Ambulatory Visit (INDEPENDENT_AMBULATORY_CARE_PROVIDER_SITE_OTHER): Payer: Medicare Other | Admitting: Physician Assistant

## 2016-12-01 ENCOUNTER — Encounter: Payer: Self-pay | Admitting: Physician Assistant

## 2016-12-01 VITALS — BP 104/54 | HR 88 | Temp 97.3°F | Ht 62.0 in | Wt 167.4 lb

## 2016-12-01 DIAGNOSIS — J40 Bronchitis, not specified as acute or chronic: Secondary | ICD-10-CM | POA: Diagnosis not present

## 2016-12-01 MED ORDER — AMOXICILLIN-POT CLAVULANATE 875-125 MG PO TABS: 1.0000 | ORAL_TABLET | Freq: Two times a day (BID) | ORAL | 0 refills | Status: DC

## 2016-12-01 NOTE — Patient Instructions (Signed)

## 2016-12-02 NOTE — Progress Notes (Signed)
BP (!) 104/54   Pulse 88   Temp 97.3 F (36.3 C) (Oral)   Ht 5\' 2"  (1.575 m)   Wt 167 lb 6.4 oz (75.9 kg)   BMI 30.62 kg/m    Subjective:    Patient ID: Krystal [name], female    DOB: 1940/12/05, 76 y.o.   MRN: [id number]  HPI: Krystal [name] is a 76 y.o. female presenting on 12/01/2016 for Cough  Patient with several days of progressing upper respiratory and bronchial symptoms. Initially there was more upper respiratory congestion. This progressed to having significant cough that is productive throughout the day and severe at night. There is occasional wheezing after coughing. Sometimes there is slight dyspnea on exertion. It is productive mucus that is yellow in color. Denies any blood.  Relevant past medical, surgical, family and social history reviewed and updated as indicated. Allergies and medications reviewed and updated.  Past Medical History:  Diagnosis Date  . Anemia   . Asthma   . Brain tumor ([place])    1986  . Breast cancer ([place])    Right breast  . Cataract   . Colon polyps   . COPD (chronic obstructive pulmonary disease) ([place])   . Hiatal hernia   . Hyperlipidemia   . Leg edema   . Macular degeneration   . PVD (posterior vitreous detachment)   . Right knee pain   . Shingles rash     Past Surgical History:  Procedure Laterality Date  . ABDOMINAL HYSTERECTOMY    . COLONOSCOPY    . CRANIECTOMY / CRANIOTOMY FOR EXCISION OF BRAIN TUMOR     Left side brain  . EYE SURGERY    . MASTECTOMY Right   . PARTIAL HYSTERECTOMY    . POLYPECTOMY    . SKIN CANCER EXCISION     Under left eye    Review of Systems  Constitutional: Positive for chills and fatigue. Negative for activity change and appetite change.  HENT: Positive for congestion, postnasal drip and sore throat.   Eyes: Negative.   Respiratory: Positive for cough, shortness of breath and wheezing.   Cardiovascular: Negative.  Negative for chest pain, palpitations and leg swelling.    Gastrointestinal: Negative.   Genitourinary: Negative.   Musculoskeletal: Negative.   Skin: Negative.   Neurological: Positive for headaches.    Allergies as of 12/01/2016      Reactions   Actonel [risedronate] Nausea And Vomiting, Other (See Comments)   Throat tightness   Fosamax [alendronate Sodium] Other (See Comments)   esophagitis   Mevacor [lovastatin] Other (See Comments)   myalgias   Miacalcin [calcitonin ([place])] Nausea And Vomiting   Mobic [meloxicam] Hives, Itching   rash      Medication List       Accurate as of 12/01/16 11:59 PM. Always use your most recent med list.          amoxicillin-clavulanate 875-125 MG tablet Commonly known as:  AUGMENTIN Take 1 tablet by mouth 2 (two) times daily.   aspirin EC 81 MG tablet Take 81 mg by mouth at bedtime.   Calcium Citrate-Vitamin D 315-250 MG-UNIT Tabs Commonly known as:  CALCIUM CITRATE + D Take 2 tablets by mouth daily.   cetirizine 10 MG tablet Commonly known as:  ZYRTEC Take 10 mg by mouth at bedtime.   dextromethorphan-guaiFENesin 30-600 MG 12hr tablet Commonly known as:  MUCINEX DM Take 1 tablet by mouth 2 (two) times daily.   diazepam 5 MG tablet Commonly known  as:  VALIUM Take 1 tablet (5 mg total) by mouth every 6 (six) hours as needed.   diltiazem 240 MG 24 hr capsule Commonly known as:  CARDIZEM CD TAKE ONE CAPSULE BY MOUTH ONE TIME DAILY   esomeprazole 40 MG capsule Commonly known as:  NEXIUM TAKE 1 CAPSULE (40 MG TOTAL) BY MOUTH DAILY.   ferrous sulfate 325 (65 FE) MG tablet Take 325 mg by mouth at bedtime.   montelukast 10 MG tablet Commonly known as:  SINGULAIR TAKE 1 TABLET (10 MG TOTAL) BY MOUTH AT BEDTIME.   nitroGLYCERIN 0.4 MG/SPRAY spray Commonly known as:  NITROLINGUAL Use one spray under tongue as needed for chest pain. May repeat dose in 5 minutes. If chest pain not relieved than call 911.   PRESERVISION/LUTEIN PO Take 1 capsule by mouth at bedtime.   PROAIR HFA  108 (90 Base) MCG/ACT inhaler Generic drug:  albuterol INHALE TWO PUFFS BY MOUTH 4 TIMES DAILY AS NEEDED   simvastatin 10 MG tablet Commonly known as:  ZOCOR TAKE ONE TABLET BY MOUTH ONE TIME DAILY   Vitamin D 2000 units Caps Take 1 capsule by mouth at bedtime.          Objective:    BP (!) 104/54   Pulse 88   Temp 97.3 F (36.3 C) (Oral)   Ht 5\' 2"  (1.575 m)   Wt 167 lb 6.4 oz (75.9 kg)   BMI 30.62 kg/m   Allergies  Allergen Reactions  . Actonel [Risedronate] Nausea And Vomiting and Other (See Comments)    Throat tightness  . Fosamax [Alendronate Sodium] Other (See Comments)    esophagitis  . Mevacor [Lovastatin] Other (See Comments)    myalgias  . Miacalcin [Calcitonin ([place])] Nausea And Vomiting  . Mobic [Meloxicam] Hives and Itching    rash    Physical Exam  Constitutional: She is oriented to person, place, and time. She appears well-developed and well-nourished.  HENT:  Head: Normocephalic and atraumatic.  Right Ear: There is drainage and tenderness.  Left Ear: There is drainage and tenderness.  Nose: Mucosal edema and rhinorrhea present. Right sinus exhibits maxillary sinus tenderness and frontal sinus tenderness. Left sinus exhibits maxillary sinus tenderness and frontal sinus tenderness.  Mouth/Throat: Oropharyngeal exudate and posterior oropharyngeal erythema present.  Eyes: Conjunctivae and EOM are normal. Pupils are equal, round, and reactive to light.  Neck: Normal range of motion. Neck supple.  Cardiovascular: Normal rate, regular rhythm, normal heart sounds and intact distal pulses.   Pulmonary/Chest: Effort normal. She has wheezes in the right upper field and the left upper field.  Abdominal: Soft. Bowel sounds are normal.  Neurological: She is alert and oriented to person, place, and time. She has normal reflexes.  Skin: Skin is warm and dry. No rash noted.  Psychiatric: She has a normal mood and affect. Her behavior is normal. Judgment and  thought content normal.        Assessment & Plan:   1. Bronchitis - amoxicillin-clavulanate (AUGMENTIN) 875-125 MG tablet; Take 1 tablet by mouth 2 (two) times daily.  Dispense: 20 tablet; Refill: 0   Current Outpatient Prescriptions:  .  aspirin EC 81 MG tablet, Take 81 mg by mouth at bedtime., Disp: , Rfl:  .  Calcium Citrate-Vitamin D (CALCIUM CITRATE + D) 315-250 MG-UNIT TABS, Take 2 tablets by mouth daily., Disp: 120 tablet, Rfl:  .  cetirizine (ZYRTEC) 10 MG tablet, Take 10 mg by mouth at bedtime. , Disp: , Rfl:  .  Cholecalciferol (VITAMIN D) 2000 UNITS CAPS, Take 1 capsule by mouth at bedtime., Disp: , Rfl:  .  dextromethorphan-guaiFENesin (MUCINEX DM) 30-600 MG 12hr tablet, Take 1 tablet by mouth 2 (two) times daily., Disp: 20 tablet, Rfl: 0 .  diazepam (VALIUM) 5 MG tablet, Take 1 tablet (5 mg total) by mouth every 6 (six) hours as needed., Disp: 30 tablet, Rfl: 5 .  diltiazem (CARDIZEM CD) 240 MG 24 hr capsule, TAKE ONE CAPSULE BY MOUTH ONE TIME DAILY, Disp: 90 capsule, Rfl: 3 .  esomeprazole (NEXIUM) 40 MG capsule, TAKE 1 CAPSULE (40 MG TOTAL) BY MOUTH DAILY., Disp: 90 capsule, Rfl: 0 .  ferrous sulfate 325 (65 FE) MG tablet, Take 325 mg by mouth at bedtime., Disp: , Rfl:  .  montelukast (SINGULAIR) 10 MG tablet, TAKE 1 TABLET (10 MG TOTAL) BY MOUTH AT BEDTIME., Disp: 90 tablet, Rfl: 0 .  Multiple Vitamins-Minerals (PRESERVISION/LUTEIN PO), Take 1 capsule by mouth at bedtime. , Disp: , Rfl:  .  nitroGLYCERIN (NITROLINGUAL) 0.4 MG/SPRAY spray, Use one spray under tongue as needed for chest pain. May repeat dose in 5 minutes. If chest pain not relieved than call 911., Disp: 12 g, Rfl: 1 .  PROAIR HFA 108 (90 Base) MCG/ACT inhaler, INHALE TWO PUFFS BY MOUTH 4 TIMES DAILY AS NEEDED, Disp: 9 g, Rfl: 2 .  simvastatin (ZOCOR) 10 MG tablet, TAKE ONE TABLET BY MOUTH ONE TIME DAILY, Disp: 90 tablet, Rfl: 0 .  amoxicillin-clavulanate (AUGMENTIN) 875-125 MG tablet, Take 1 tablet by mouth 2  (two) times daily., Disp: 20 tablet, Rfl: 0  Continue all other maintenance medications as listed above.  Follow up plan: Return if symptoms worsen or fail to improve.  Educational handout given for bronchitis  Terald Sleeper PA-C [facility] [address]  [place], [place] [address] [phone]   12/02/2016, 1:37 PM

## 2016-12-03 ENCOUNTER — Ambulatory Visit: Payer: Medicare Other | Attending: Physical Medicine and Rehabilitation | Admitting: Physical Therapy

## 2016-12-03 ENCOUNTER — Encounter: Payer: Self-pay | Admitting: Physical Therapy

## 2016-12-03 DIAGNOSIS — M25562 Pain in left knee: Secondary | ICD-10-CM | POA: Insufficient documentation

## 2016-12-03 DIAGNOSIS — G8929 Other chronic pain: Secondary | ICD-10-CM

## 2016-12-03 DIAGNOSIS — M25561 Pain in right knee: Secondary | ICD-10-CM | POA: Diagnosis not present

## 2016-12-03 DIAGNOSIS — M545 Low back pain: Secondary | ICD-10-CM | POA: Diagnosis not present

## 2016-12-03 NOTE — Therapy (Signed)
[facility] [place], [place], [address] Phone: [phone]   Fax:  [phone]  Physical Therapy Treatment  Patient Details  Name: Krystal [name] MRN: [id number] Date of Birth: May 18, 1941 Referring Provider: Suella Broad, MD  Encounter Date: 12/03/2016      PT End of Session - 12/03/16 1255    Visit Number 8   Number of Visits 16   Date for PT Re-Evaluation 12/15/16   PT Start Time 1230   PT Stop Time 1314   PT Time Calculation (min) 44 min   Activity Tolerance Patient tolerated treatment well   Behavior During Therapy [facility] for tasks assessed/performed      Past Medical History:  Diagnosis Date  . Anemia   . Asthma   . Brain tumor ([place])    1986  . Breast cancer ([place])    Right breast  . Cataract   . Colon polyps   . COPD (chronic obstructive pulmonary disease) ([place])   . Hiatal hernia   . Hyperlipidemia   . Leg edema   . Macular degeneration   . PVD (posterior vitreous detachment)   . Right knee pain   . Shingles rash     Past Surgical History:  Procedure Laterality Date  . ABDOMINAL HYSTERECTOMY    . COLONOSCOPY    . CRANIECTOMY / CRANIOTOMY FOR EXCISION OF BRAIN TUMOR     Left side brain  . EYE SURGERY    . MASTECTOMY Right   . PARTIAL HYSTERECTOMY    . POLYPECTOMY    . SKIN CANCER EXCISION     Under left eye    There were no vitals filed for this visit.      Subjective Assessment - 12/03/16 1236    Subjective Patient reported being sick today, feeling about the same pain in knees thus far   Patient Stated Goals Be able to walk a mile for exercise again.   Currently in Pain? Yes   Pain Score 5    Pain Location Knee   Pain Orientation Right   Pain Descriptors / Indicators Aching   Pain Type Chronic pain   Pain Onset More than a month ago   Pain Frequency Constant   Aggravating Factors  prolong activity   Pain Relieving Factors at rest                          [facility] Adult PT Treatment/Exercise - 12/03/16 0001      Lumbar Exercises: Supine   Bridge 20 reps;3 seconds     Knee/Hip Exercises: Aerobic   Nustep L5 x38min, monitored for progression     Knee/Hip Exercises: Seated   Long Arc Quad Strengthening;Both;3 sets;10 reps;Limitations   Long Arc Quad Weight 3 lbs.     Knee/Hip Exercises: Supine   Short Arc Quad Sets Strengthening;Both;3 sets;10 reps  3#   Straight Leg Raises Strengthening;Both;2 sets;10 reps   Other Supine Knee/Hip Exercises hip abd with red t-band  x30 each   Other Supine Knee/Hip Exercises add squeeze x30 with SAQ 3#     Moist Heat Therapy   Number Minutes Moist Heat 15 Minutes   Moist Heat Location Knee     Electrical Stimulation   Electrical Stimulation Location R knee   Electrical Stimulation Action IFC   Electrical Stimulation Parameters 80-150hz  x1min   Electrical Stimulation Goals Pain  PT Short Term Goals - 10/16/16 1535      PT SHORT TERM GOAL #1   Title STG's=LTG's.           PT Long Term Goals - 11/14/16 1131      PT LONG TERM GOAL #1   Title Independent with a HEP.   Time 8   Period Weeks   Status On-going     PT LONG TERM GOAL #2   Title Walk a mile for exercise with pain not > 3/10.   Time 8   Period Weeks   Status On-going     PT LONG TERM GOAL #3   Title Perform ADL's with pain not > 3/10.   Time 8   Period Weeks   Status On-going               Plan - 12/03/16 1257    Clinical Impression Statement Patient tolerated treatment well yet feeling bad due to illness. Patient limited with ilness and unable to tolerate standing exercises today. Patient has reported not much improvement thus far and ongoing pain in right knee and feels like it wants to buckle. Antalgic gait today. Patient current goals ongoing due to pain deficts.    Rehab Potential Good   PT Frequency 2x / week   PT Duration 8 weeks   PT Treatment/Interventions ADLs/Self Care Home  Management;Electrical Stimulation;Moist Heat;Ultrasound;Gait training;Functional mobility training;Patient/family education;Neuromuscular re-education;Therapeutic exercise;Therapeutic activities;Manual techniques;Passive range of motion;Vasopneumatic Device   PT Next Visit Plan cont with POC for Pain-free bilateral quadriceps/hip and core strengthening, standing exercises   Consulted and Agree with Plan of Care Patient      Patient will benefit from skilled therapeutic intervention in order to improve the following deficits and impairments:  Pain, Decreased activity tolerance, Decreased strength, Decreased range of motion, Decreased mobility  Visit Diagnosis: Chronic midline low back pain, with sciatica presence unspecified  Chronic pain of right knee  Chronic pain of left knee     Problem List Patient Active Problem List   Diagnosis Date Noted  . Influenza with pneumonia 10/30/2015  . CAP (community acquired pneumonia) 10/29/2015  . Abdominal aortic atherosclerosis ([place]) 04/10/2015  . PSVT (paroxysmal supraventricular tachycardia) ([place]) 05/10/2014  . Macular degeneration, age related, nonexudative 08/24/2013  . History of right mastectomy 07/13/2013  . Anemia, iron deficiency 06/21/2013  . Allergic rhinitis 06/21/2013  . Chronic low back pain 06/21/2013  . Malignant neoplasm of female breast ([place]) 11/22/2007  . Mixed hyperlipidemia 11/22/2007  . Anxiety state 11/22/2007  . DEPRESSION 11/22/2007  . Asthma 11/22/2007  . GERD 11/22/2007  . GASTRITIS, WITH HEMORRHAGE 08/14/2007  . DUODENITIS, WITHOUT HEMORRHAGE 08/14/2007  . HIATAL HERNIA 08/14/2007  . 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    Donaven Criswell P, PTA 12/03/2016, 1:17 PM  [facility] [place], [place], [address] Phone: [phone]   Fax:  [phone]  Name: Krystal [name] MRN: [id number] Date of Birth: 1940/09/16

## 2016-12-10 ENCOUNTER — Encounter: Payer: Self-pay | Admitting: Physical Therapy

## 2016-12-10 ENCOUNTER — Ambulatory Visit: Payer: Medicare Other | Admitting: Physical Therapy

## 2016-12-10 DIAGNOSIS — G8929 Other chronic pain: Secondary | ICD-10-CM

## 2016-12-10 DIAGNOSIS — M25562 Pain in left knee: Secondary | ICD-10-CM

## 2016-12-10 DIAGNOSIS — M545 Low back pain: Secondary | ICD-10-CM | POA: Diagnosis not present

## 2016-12-10 DIAGNOSIS — M25561 Pain in right knee: Secondary | ICD-10-CM

## 2016-12-10 NOTE — Therapy (Signed)
[facility] [place], [place], [address] Phone: [phone]   Fax:  [phone]  Physical Therapy Treatment  Patient Details  Name: Krystal [name] MRN: [id number] Date of Birth: August 15, 1940 Referring Provider: Suella Broad, MD  Encounter Date: 12/10/2016      PT End of Session - 12/10/16 1313    Visit Number 9   Number of Visits 16   Date for PT Re-Evaluation 12/15/16   PT Start Time 1228   PT Stop Time 1326   PT Time Calculation (min) 58 min   Activity Tolerance Patient tolerated treatment well   Behavior During Therapy [facility] for tasks assessed/performed      Past Medical History:  Diagnosis Date  . Anemia   . Asthma   . Brain tumor ([place])    1986  . Breast cancer ([place])    Right breast  . Cataract   . Colon polyps   . COPD (chronic obstructive pulmonary disease) ([place])   . Hiatal hernia   . Hyperlipidemia   . Leg edema   . Macular degeneration   . PVD (posterior vitreous detachment)   . Right knee pain   . Shingles rash     Past Surgical History:  Procedure Laterality Date  . ABDOMINAL HYSTERECTOMY    . COLONOSCOPY    . CRANIECTOMY / CRANIOTOMY FOR EXCISION OF BRAIN TUMOR     Left side brain  . EYE SURGERY    . MASTECTOMY Right   . PARTIAL HYSTERECTOMY    . POLYPECTOMY    . SKIN CANCER EXCISION     Under left eye    There were no vitals filed for this visit.      Subjective Assessment - 12/10/16 1231    Subjective Patient reported less pain today just ongoing episondes of knee instability feeling like right knee wants to buckle   Pertinent History OP.   Patient Stated Goals Be able to walk a mile for exercise again.   Currently in Pain? Yes   Pain Score 3    Pain Location Knee   Pain Orientation Right   Pain Descriptors / Indicators Aching   Pain Type Chronic pain   Pain Onset More than a month ago   Pain Frequency Constant   Aggravating Factors  prolong actvity   Pain Relieving  Factors at rest                         [facility] Adult PT Treatment/Exercise - 12/10/16 0001      Knee/Hip Exercises: Aerobic   Nustep L5 x 22min UE/LE, monitored for progression     Knee/Hip Exercises: Standing   Terminal Knee Extension Limitations x30 with pink XTS  with cues for glut squeeze     Knee/Hip Exercises: Seated   Long Arc Quad Strengthening;Both;3 sets;10 reps;Limitations   Long Arc Quad Weight 4 lbs.     Knee/Hip Exercises: Supine   Short Arc Quad Sets Strengthening;Both;10 reps;2 sets  4# on ankle with 2#ball squeeze   Bridges with Greig Right Strengthening;Both;20 reps   Straight Leg Raise with External Rotation Strengthening;Both;2 sets;10 reps   Other Supine Knee/Hip Exercises clam with red t-band x30     Moist Heat Therapy   Number Minutes Moist Heat --   Moist Heat Location --     Electrical Stimulation   Electrical Stimulation Location R knee   Electrical Stimulation Action IFC   Electrical Stimulation Parameters 80-150hz  x61min  Electrical Stimulation Goals Pain                  PT Short Term Goals - 10/16/16 1535      PT SHORT TERM GOAL #1   Title STG's=LTG's.           PT Long Term Goals - 11/14/16 1131      PT LONG TERM GOAL #1   Title Independent with a HEP.   Time 8   Period Weeks   Status On-going     PT LONG TERM GOAL #2   Title Walk a mile for exercise with pain not > 3/10.   Time 8   Period Weeks   Status On-going     PT LONG TERM GOAL #3   Title Perform ADL's with pain not > 3/10.   Time 8   Period Weeks   Status On-going               Plan - 12/10/16 1314    Clinical Impression Statement Patient tolerated treatment well today and able to progress with strengthening activities. Patient reported feeling less pain yet ongoing episodes of right knee instability. Patient was given HEP for knee strengthening. Patient goals progressing yet ongoing due to pain deficts   Rehab Potential Good    PT Frequency 2x / week   PT Duration 8 weeks   PT Treatment/Interventions ADLs/Self Care Home Management;Electrical Stimulation;Moist Heat;Ultrasound;Gait training;Functional mobility training;Patient/family education;Neuromuscular re-education;Therapeutic exercise;Therapeutic activities;Manual techniques;Passive range of motion;Vasopneumatic Device   PT Next Visit Plan cont with POC for Pain-free bilateral quadriceps/hip and core strengthening, standing exercises   Consulted and Agree with Plan of Care Patient      Patient will benefit from skilled therapeutic intervention in order to improve the following deficits and impairments:  Pain, Decreased activity tolerance, Decreased strength, Decreased range of motion, Decreased mobility  Visit Diagnosis: Chronic midline low back pain, with sciatica presence unspecified  Chronic pain of right knee  Chronic pain of left knee     Problem List Patient Active Problem List   Diagnosis Date Noted  . Influenza with pneumonia 10/30/2015  . CAP (community acquired pneumonia) 10/29/2015  . Abdominal aortic atherosclerosis ([place]) 04/10/2015  . PSVT (paroxysmal supraventricular tachycardia) ([place]) 05/10/2014  . Macular degeneration, age related, nonexudative 08/24/2013  . History of right mastectomy 07/13/2013  . Anemia, iron deficiency 06/21/2013  . Allergic rhinitis 06/21/2013  . Chronic low back pain 06/21/2013  . Malignant neoplasm of female breast ([place]) 11/22/2007  . Mixed hyperlipidemia 11/22/2007  . Anxiety state 11/22/2007  . DEPRESSION 11/22/2007  . Asthma 11/22/2007  . GERD 11/22/2007  . GASTRITIS, WITH HEMORRHAGE 08/14/2007  . DUODENITIS, WITHOUT HEMORRHAGE 08/14/2007  . HIATAL HERNIA 08/14/2007  . 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    Briah Nary P, PTA 12/10/2016, 1:39 PM  [facility] [address] [place], [place], [address] Phone: [phone]   Fax:   [phone]  Name: Krystal [name] MRN: [id number] Date of Birth: Oct 26, 1940

## 2016-12-10 NOTE — Patient Instructions (Addendum)
  Knee Extension (Sitting)   Place __0-3__ pound weight on left ankle and straighten knee fully, lower slowly. Repeat _10___ times per set. Do __2-3__ sets per session. Do __2-3__ sessions per day.  Strengthening: Hip Abduction (Side-Lying)  Strengthening: Straight Leg Raise (Phase 1)  Repeat _10___ times per set. Do __2__ sets per session. Do __2__ sessions per day.  Pelvic Tilt: Posterior - Legs Bent (Supine)   Bridging   Slowly raise buttocks from floor, keeping stomach tight. Repeat _10___ times per set. Do __2__ sets per session. Do __2__ sessions per day.   Straight Leg Raise   Tighten stomach and slowly raise locked right leg __4__ inches from floor. Repeat __10-30__ times per set. Do __2__ sets per session. Do __2__ sessions per day.     Strengthening: Hip Abductor - Resisted    With band looped around both legs above knees, push thighs apart. Repeat __10__ times per set. Do _3___ sets per session. Do ___2_ sessions per day.

## 2016-12-17 ENCOUNTER — Encounter: Payer: Self-pay | Admitting: Physical Therapy

## 2016-12-17 ENCOUNTER — Ambulatory Visit: Payer: Medicare Other | Admitting: Physical Therapy

## 2016-12-17 DIAGNOSIS — M25561 Pain in right knee: Secondary | ICD-10-CM

## 2016-12-17 DIAGNOSIS — M25562 Pain in left knee: Secondary | ICD-10-CM

## 2016-12-17 DIAGNOSIS — G8929 Other chronic pain: Secondary | ICD-10-CM

## 2016-12-17 DIAGNOSIS — M545 Low back pain: Secondary | ICD-10-CM | POA: Diagnosis not present

## 2016-12-17 NOTE — Therapy (Signed)
[facility] [place], [place], [address] Phone: [phone]   Fax:  [phone]  Physical Therapy Treatment  Patient Details  Name: Krystal [name] MRN: [id number] Date of Birth: [birth year] Referring Provider: Suella Broad, MD  Encounter Date: 12/17/2016      PT End of Session - 12/17/16 1320    Visit Number 10   Number of Visits 16   Date for PT Re-Evaluation 12/15/16   PT Start Time 1230   PT Stop Time 1330   PT Time Calculation (min) 60 min   Activity Tolerance Patient tolerated treatment well   Behavior During Therapy [facility] for tasks assessed/performed      Past Medical History:  Diagnosis Date  . Anemia   . Asthma   . Brain tumor ([place])    1986  . Breast cancer ([place])    Right breast  . Cataract   . Colon polyps   . COPD (chronic obstructive pulmonary disease) ([place])   . Hiatal hernia   . Hyperlipidemia   . Leg edema   . Macular degeneration   . PVD (posterior vitreous detachment)   . Right knee pain   . Shingles rash     Past Surgical History:  Procedure Laterality Date  . ABDOMINAL HYSTERECTOMY    . COLONOSCOPY    . CRANIECTOMY / CRANIOTOMY FOR EXCISION OF BRAIN TUMOR     Left side brain  . EYE SURGERY    . MASTECTOMY Right   . PARTIAL HYSTERECTOMY    . POLYPECTOMY    . SKIN CANCER EXCISION     Under left eye    There were no vitals filed for this visit.      Subjective Assessment - 12/17/16 1234    Subjective Patient reported less pain overall yet ongoing knee feels like it wants to buckle   Pertinent History OP.   Patient Stated Goals Be able to walk a mile for exercise again.   Currently in Pain? Yes   Pain Score 3    Pain Location Knee   Pain Orientation Right   Pain Descriptors / Indicators Aching   Pain Type Chronic pain   Pain Onset More than a month ago   Pain Frequency Constant   Aggravating Factors  prolong activity   Pain Relieving Factors at rest                          [facility] Adult PT Treatment/Exercise - 12/17/16 0001      Knee/Hip Exercises: Aerobic   Nustep L5 x 19mn UE/LE, monitored for progression     Knee/Hip Exercises: Standing   Terminal Knee Extension Limitations x30 with pink XTS  with glut squeeze   Other Standing Knee Exercises standing hip flexion eccentric control  40#x131m     Knee/Hip Exercises: Seated   Long Arc Quad Strengthening;Both;3 sets;10 reps;Limitations   Long Arc Quad Weight 4 lbs.     Knee/Hip Exercises: Supine   Short Arc Quad Sets Strengthening;Both;10 reps;2 sets  4# ball squeeze and 4# ankle wt   BrConstance Hawith BaGreig Righttrengthening;Both;20 reps   Straight Leg Raise with External Rotation Strengthening;Both;2 sets;10 reps   Other Supine Knee/Hip Exercises clam with red t-band x30     Moist Heat Therapy   Number Minutes Moist Heat 15 Minutes   Moist Heat Location Knee     Electrical Stimulation   Electrical Stimulation Location R knee   Electrical Stimulation Action  IFC   Electrical Stimulation Parameters 80-_0  x48mn   Electrical Stimulation Goals Pain                  PT Short Term Goals - 10/16/16 1535      PT SHORT TERM GOAL #1   Title STG's=LTG's.           PT Long Term Goals - 12/17/16 1321      PT LONG TERM GOAL #1   Title Independent with a HEP.   Time 8   Period Weeks   Status Achieved     PT LONG TERM GOAL #2   Title Walk a mile for exercise with pain not > 3/10.   Time 8   Period Weeks   Status Partially Met  only walked a few blocks distance thus far yet is able to at 3/10     PT LONG TERM GOAL #3   Title Perform ADL's with pain not > 3/10.   Time 8   Period Weeks   Status Achieved               Plan - 12/17/16 1322    Clinical Impression Statement Patient tolerated treatment well today. Patient reported 50% improvement overall and less pain. Patient has some ongoing feeling that her right knee wants to  buckle. Patient has decreased pain with ADL's and independent with HEP yet unable to walk a long distance at this time. Patient met LTG #1 and #3 today.    Rehab Potential Good   PT Frequency 2x / week   PT Duration 8 weeks   PT Treatment/Interventions ADLs/Self Care Home Management;Electrical Stimulation;Moist Heat;Ultrasound;Gait training;Functional mobility training;Patient/family education;Neuromuscular re-education;Therapeutic exercise;Therapeutic activities;Manual techniques;Passive range of motion;Vasopneumatic Device   PT Next Visit Plan cont with POC for Pain-free bilateral quadriceps/hip and core strengthening, standing exercises (MD. appt next tuesday, note sent)   Consulted and Agree with Plan of Care Patient      Patient will benefit from skilled therapeutic intervention in order to improve the following deficits and impairments:  Pain, Decreased activity tolerance, Decreased strength, Decreased range of motion, Decreased mobility  Visit Diagnosis: Chronic midline low back pain, with sciatica presence unspecified  Chronic pain of right knee  Chronic pain of left knee     Problem List Patient Active Problem List   Diagnosis Date Noted  . Influenza with pneumonia 10/30/2015  . CAP (community acquired pneumonia) 10/29/2015  . [place] 04/10/2015  . PSVT (parox[place]mal [place]ventricular tachycardia) ([place] 05/10/2014  . Macular degeneration, age related, nonexudative 08/24/2013  . History of right mastectomy 07/13/2013  . Anemia, iron deficiency 06/21/2013  . Allergic rhinitis 06/21/2013  . Chronic low back pain 06/21/2013  . Malignant ne[place] of female breast ([place] 11/22/2007  . Mixed hyperlipidemia 11/22/2007  . Anxiety state 11/22/2007  . DEPRESSION 11/22/2007  . Asthma 11/22/2007  . GERD 11/22/2007  . GASTRITIS, WITH HEMORRHAGE 08/14/2007  . DUODENITIS, WITHOUT HEMORRHAGE 08/14/2007  . HIATAL HERNIA 08/14/2007  . ADENOMATOUS COLONIC  POLYP 02/23/2007    Krystal [name] PTA 12/17/16 1:43 PM   [facility] [address] [address]Phone: [phone]  Fax:  [phone] Name: Krystal RAHMMRN: [id number] of Birth: [birth year]

## 2016-12-19 ENCOUNTER — Encounter: Payer: Self-pay | Admitting: Gastroenterology

## 2016-12-23 ENCOUNTER — Other Ambulatory Visit: Payer: Self-pay | Admitting: Pharmacist

## 2016-12-23 DIAGNOSIS — M1711 Unilateral primary osteoarthritis, right knee: Secondary | ICD-10-CM | POA: Diagnosis not present

## 2016-12-23 DIAGNOSIS — M1712 Unilateral primary osteoarthritis, left knee: Secondary | ICD-10-CM | POA: Diagnosis not present

## 2016-12-23 DIAGNOSIS — M7051 Other bursitis of knee, right knee: Secondary | ICD-10-CM | POA: Diagnosis not present

## 2016-12-23 MED ORDER — DENOSUMAB 60 MG/ML ~~LOC~~ SOLN: 60.0000 mg | Freq: Once | SUBCUTANEOUS | 0 refills | Status: AC

## 2016-12-26 ENCOUNTER — Encounter: Payer: Self-pay | Admitting: Gastroenterology

## 2016-12-26 ENCOUNTER — Encounter: Payer: Self-pay | Admitting: Nurse Practitioner

## 2016-12-26 ENCOUNTER — Ambulatory Visit (INDEPENDENT_AMBULATORY_CARE_PROVIDER_SITE_OTHER): Payer: Medicare Other | Admitting: Nurse Practitioner

## 2016-12-26 VITALS — BP 125/75 | HR 83 | Temp 97.7°F | Ht 62.0 in | Wt 172.0 lb

## 2016-12-26 DIAGNOSIS — S70262A Insect bite (nonvenomous), left hip, initial encounter: Secondary | ICD-10-CM

## 2016-12-26 DIAGNOSIS — W57XXXA Bitten or stung by nonvenomous insect and other nonvenomous arthropods, initial encounter: Secondary | ICD-10-CM | POA: Diagnosis not present

## 2016-12-26 MED ORDER — DOXYCYCLINE HYCLATE 100 MG PO TABS: 100.0000 mg | ORAL_TABLET | Freq: Two times a day (BID) | ORAL | 0 refills | Status: DC

## 2016-12-26 NOTE — Progress Notes (Signed)
   Subjective:    Patient ID: Krystal [name], female    DOB: Mar 14, 1941, 76 y.o.   MRN: [id number]  HPI Patient brings herself in today c/o tick bite- removed a tick off of her left flank yesterday evening- area is still red and itching. She has no idea how long tick was attached.    Review of Systems  Constitutional: Negative.   HENT: Negative.   Respiratory: Negative.   Cardiovascular: Negative.   Neurological: Negative.   Psychiatric/Behavioral: Negative.   All other systems reviewed and are negative.      Objective:   Physical Exam  Constitutional: She is oriented to person, place, and time. She appears well-developed and well-nourished. No distress.  Cardiovascular: Normal rate.   Pulmonary/Chest: Breath sounds normal.  Neurological: She is alert and oriented to person, place, and time.  Skin: Skin is warm.  Erythematous area on left flank where tick was removed  Psychiatric: She has a normal mood and affect. Her behavior is normal. Judgment and thought content normal.   BP 125/75 (BP Location: Left Arm, Patient Position: Sitting, Cuff Size: Normal)   Pulse 83   Temp 97.7 F (36.5 C) (Oral)   Ht 5\' 2"  (1.575 m)   Wt 172 lb (78 kg)   BMI 31.46 kg/m       Assessment & Plan:   1. Tick bite, initial encounter    Meds ordered this encounter  Medications  . doxycycline (VIBRA-TABS) 100 MG tablet    Sig: Take 1 tablet (100 mg total) by mouth 2 (two) times daily. 1 po bid    Dispense:  20 tablet    Refill:  0    Order Specific Question:   Supervising Provider    Answer:   Evette Doffing, CAROL L [[id number]]   Do not pick or scratch at area RTO prn  Mary-Margaret Hassell Done, FNP

## 2016-12-26 NOTE — Addendum Note (Signed)
Addended by: Chevis Pretty on: 12/26/2016 06:19 PM   Modules accepted: Orders

## 2016-12-26 NOTE — Patient Instructions (Signed)
Tick Bite Information Introduction Ticks are insects that attach themselves to the skin. There are many types of ticks. Common types include wood ticks and deer ticks. Sometimes, ticks carry diseases that can make a person very ill. The most common places for ticks to attach themselves are the scalp, neck, armpits, waist, and groin. HOW CAN YOU PREVENT TICK BITES? Take these steps to help prevent tick bites when you are outdoors:  Wear long sleeves and long pants.  Wear white clothes so you can see ticks more easily.  Tuck your pant legs into your socks.  If walking on a trail, stay in the middle of the trail to avoid brushing against bushes.  Avoid walking through areas with long grass.  Put bug spray on all skin that is showing and along boot tops, pant legs, and sleeve cuffs.  Check clothes, hair, and skin often and before going inside.  Brush off any ticks that are not attached.  Take a shower or bath as soon as possible after being outdoors. HOW SHOULD YOU REMOVE A TICK? Ticks should be removed as soon as possible to help prevent diseases. 1. If latex gloves are available, put them on before trying to remove a tick. 2. Use tweezers to grasp the tick as close to the skin as possible. You may also use curved forceps or a tick removal tool. Grasp the tick as close to its head as possible. Avoid grasping the tick on its body. 3. Pull gently upward until the tick lets go. Do not twist the tick or jerk it suddenly. This may break off the tick's head or mouth parts. 4. Do not squeeze or crush the tick's body. This could force disease-carrying fluids from the tick into your body. 5. After the tick is removed, wash the bite area and your hands with soap and water or alcohol. 6. Apply a small amount of antiseptic cream or ointment to the bite site. 7. Wash any tools that were used. Do not try to remove a tick by applying a hot match, petroleum jelly, or fingernail polish to the tick. These  methods do not work. They may also increase the chances of disease being spread from the tick bite. WHEN SHOULD YOU SEEK HELP? Contact your health care provider if you are unable to remove a tick or if a part of the tick breaks off in the skin. After a tick bite, you need to watch for signs and symptoms of diseases that can be spread by ticks. Contact your health care provider if you develop any of the following:  Fever.  Rash.  Redness and puffiness (swelling) in the area of the tick bite.  Tender, puffy lymph glands.  Watery poop (diarrhea).  Weight loss.  Cough.  Feeling more tired than normal (fatigue).  Muscle, joint, or bone pain.  Belly (abdominal) pain.  Headache.  Change in your level of consciousness.  Trouble walking or moving your legs.  Loss of feeling (numbness) in the legs.  Loss of movement (paralysis).  Shortness of breath.  Confusion.  Throwing up (vomiting) many times. This information is not intended to replace advice given to you by your health care provider. Make sure you discuss any questions you have with your health care provider. Document Released: 10/15/2009 Document Revised: 12/27/2015 Document Reviewed: 12/29/2012 Elsevier Interactive Patient Education  2017 Elsevier Inc.  

## 2017-01-01 ENCOUNTER — Encounter: Payer: Self-pay | Admitting: Physical Therapy

## 2017-01-01 ENCOUNTER — Ambulatory Visit: Payer: Medicare Other | Admitting: Physical Therapy

## 2017-01-01 DIAGNOSIS — M25562 Pain in left knee: Secondary | ICD-10-CM | POA: Diagnosis not present

## 2017-01-01 DIAGNOSIS — G8929 Other chronic pain: Secondary | ICD-10-CM

## 2017-01-01 DIAGNOSIS — M545 Low back pain: Principal | ICD-10-CM

## 2017-01-01 DIAGNOSIS — M25561 Pain in right knee: Secondary | ICD-10-CM | POA: Diagnosis not present

## 2017-01-01 NOTE — Therapy (Signed)
[facility] [place], [place], [address] Phone: [phone]   Fax:  [phone]  Physical Therapy Treatment  Patient Details  Name: Krystal [name] MRN: [id number] Date of Birth: 05-13-1941 Referring Provider: Suella Broad, MD  Encounter Date: 01/01/2017      PT End of Session - 01/01/17 1251    Visit Number 11   Number of Visits 24   Date for PT Re-Evaluation 01/20/17  new order   PT Start Time 5093   PT Stop Time 1327   PT Time Calculation (min) 56 min   Activity Tolerance Patient tolerated treatment well   Behavior During Therapy [facility] for tasks assessed/performed      Past Medical History:  Diagnosis Date  . Anemia   . Asthma   . Brain tumor ([place])    1986  . Breast cancer ([place])    Right breast  . Cataract   . Colon polyps   . COPD (chronic obstructive pulmonary disease) ([place])   . Hiatal hernia   . Hyperlipidemia   . Leg edema   . Macular degeneration   . PVD (posterior vitreous detachment)   . Right knee pain   . Shingles rash     Past Surgical History:  Procedure Laterality Date  . ABDOMINAL HYSTERECTOMY    . COLONOSCOPY    . CRANIECTOMY / CRANIOTOMY FOR EXCISION OF BRAIN TUMOR     Left side brain  . EYE SURGERY    . MASTECTOMY Right   . PARTIAL HYSTERECTOMY    . POLYPECTOMY    . SKIN CANCER EXCISION     Under left eye    There were no vitals filed for this visit.      Subjective Assessment - 01/01/17 1234    Subjective Patient went to MD and is to continue theray 4 more weeks   Patient Stated Goals Be able to walk a mile for exercise again.   Currently in Pain? Yes   Pain Score 4    Pain Location Knee   Pain Orientation Right   Pain Descriptors / Indicators Aching;Tightness   Pain Type Chronic pain   Pain Onset More than a month ago   Pain Frequency Constant   Aggravating Factors  prolong activity   Pain Relieving Factors at rest   Pain Score 2   Pain Location Back   Pain  Orientation Lower   Pain Onset More than a month ago   Pain Frequency Intermittent   Aggravating Factors  increased activity   Pain Relieving Factors at rest                         [facility] Adult PT Treatment/Exercise - 01/01/17 0001      Lumbar Exercises: Machines for Strengthening   Cybex Lumbar Extension 10# 3x10   Cybex Knee Extension 30# 3x10     Lumbar Exercises: Supine   Bridge 20 reps;3 seconds  with ball squeeze     Knee/Hip Exercises: Aerobic   Nustep L6 x 58mn UE/LE, monitored for progression     Knee/Hip Exercises: Supine   Straight Leg Raise with External Rotation Strengthening;Both;2 sets;10 reps   Other Supine Knee/Hip Exercises clam with green t-band x30     Knee/Hip Exercises: Sidelying   Hip ABduction Strengthening;2 sets;10 reps;Right   Clams 2x10     Moist Heat Therapy   Number Minutes Moist Heat 15 Minutes   Moist Heat Location Knee  Health visitor IFC   Electrical Stimulation Parameters 80-150hz  x76mn   Electrical Stimulation Goals Pain                  PT Short Term Goals - 10/16/16 1535      PT SHORT TERM GOAL #1   Title STG's=LTG's.           PT Long Term Goals - 12/17/16 1321      PT LONG TERM GOAL #1   Title Independent with a HEP.   Time 8   Period Weeks   Status Achieved     PT LONG TERM GOAL #2   Title Walk a mile for exercise with pain not > 3/10.   Time 8   Period Weeks   Status Partially Met  only walked a few blocks distance thus far yet is able to at 3/10     PT LONG TERM GOAL #3   Title Perform ADL's with pain not > 3/10.   Time 8   Period Weeks   Status Achieved               Plan - 01/01/17 1314    Clinical Impression Statement Patient tolerated treatment well today and able to progress strengthening exercises without difficulty. Patient requires slow transition from seated to standing  exercises due to fatigue. Patient reported increased pain with prolong standing or walking. Goals progressing due to pain deficts.    Rehab Potential Good   PT Frequency 2x / week   PT Duration 8 weeks   PT Treatment/Interventions ADLs/Self Care Home Management;Electrical Stimulation;Moist Heat;Ultrasound;Gait training;Functional mobility training;Patient/family education;Neuromuscular re-education;Therapeutic exercise;Therapeutic activities;Manual techniques;Passive range of motion;Vasopneumatic Device   PT Next Visit Plan cont with POC for Pain-free bilateral quadriceps/hip and core strengthening, standing exercises as tolerated   Consulted and Agree with Plan of Care Patient      Patient will benefit from skilled therapeutic intervention in order to improve the following deficits and impairments:  Pain, Decreased activity tolerance, Decreased strength, Decreased range of motion, Decreased mobility  Visit Diagnosis: Chronic midline low back pain, with sciatica presence unspecified  Chronic pain of left knee  Chronic pain of right knee     Problem List Patient Active Problem List   Diagnosis Date Noted  . Influenza with pneumonia 10/30/2015  . CAP (community acquired pneumonia) 10/29/2015  . Abdominal aortic atherosclerosis ([place] 04/10/2015  . PSVT (paroxysmal [place]ventricular tachycardia) ([place] 05/10/2014  . Macular degeneration, age related, nonexudative 08/24/2013  . History of right mastectomy 07/13/2013  . Anemia, iron deficiency 06/21/2013  . Allergic rhinitis 06/21/2013  . Chronic low back pain 06/21/2013  . Malignant neoplasm of female breast ([place] 11/22/2007  . Mixed hyperlipidemia 11/22/2007  . Anxiety state 11/22/2007  . DEPRESSION 11/22/2007  . Asthma 11/22/2007  . GERD 11/22/2007  . GASTRITIS, WITH HEMORRHAGE 08/14/2007  . DUODENITIS, WITHOUT HEMORRHAGE 08/14/2007  . HIATAL HERNIA 08/14/2007  . 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    Krystal [name] P,  PTA 01/01/2017, 1:34 PM  [facility] [address]Phone: [phone]  Fax:  [phone] Name: Krystal [name]: [id number] of Birth: 9April 24, 1942

## 2017-01-02 ENCOUNTER — Ambulatory Visit: Payer: Medicare Other | Admitting: Family Medicine

## 2017-01-05 ENCOUNTER — Encounter: Payer: Self-pay | Admitting: Gastroenterology

## 2017-01-05 ENCOUNTER — Encounter: Payer: Self-pay | Admitting: Pharmacist

## 2017-01-05 ENCOUNTER — Ambulatory Visit (INDEPENDENT_AMBULATORY_CARE_PROVIDER_SITE_OTHER): Payer: Medicare Other | Admitting: Pharmacist

## 2017-01-05 VITALS — BP 134/74 | HR 72 | Ht 63.0 in | Wt 170.0 lb

## 2017-01-05 DIAGNOSIS — Z0000 Encounter for general adult medical examination without abnormal findings: Secondary | ICD-10-CM | POA: Diagnosis not present

## 2017-01-05 NOTE — Progress Notes (Signed)
Patient ID: Krystal [name], female   DOB: August 02, 1941, 76 y.o.   MRN: [id number]     Subjective:   Krystal [name] is a 76 y.o. female who presents for a Subsequent Medicare Annual Wellness Visit.  Social History: Occupational history: worked on tobacco farm. Marital history:  Widowed Has 1 son,  Currently her son lives with her Alcohol/Tobacco/Substances: none Hobbies: cooking  Current Medications (verified) Outpatient Encounter Prescriptions as of 01/05/2017  Medication Sig  . aspirin EC 81 MG tablet Take 81 mg by mouth at bedtime.  . Calcium Citrate-Vitamin D (CALCIUM CITRATE + D) 315-250 MG-UNIT TABS Take 2 tablets by mouth daily.  . cetirizine (ZYRTEC) 10 MG tablet Take 5-10 mg by mouth at bedtime.   . Cholecalciferol (VITAMIN D) 2000 UNITS CAPS Take 1 capsule by mouth at bedtime.  . diazepam (VALIUM) 5 MG tablet Take 1 tablet (5 mg total) by mouth every 6 (six) hours as needed.  . diltiazem (CARDIZEM CD) 240 MG 24 hr capsule TAKE ONE CAPSULE BY MOUTH ONE TIME DAILY  . esomeprazole (NEXIUM) 40 MG capsule TAKE 1 CAPSULE (40 MG TOTAL) BY MOUTH DAILY.  . ferrous sulfate 325 (65 FE) MG tablet Take 325 mg by mouth at bedtime.  . montelukast (SINGULAIR) 10 MG tablet TAKE 1 TABLET (10 MG TOTAL) BY MOUTH AT BEDTIME.  . Multiple Vitamins-Minerals (PRESERVISION/LUTEIN PO) Take 1 capsule by mouth at bedtime.   . nitroGLYCERIN (NITROLINGUAL) 0.4 MG/SPRAY spray Use one spray under tongue as needed for chest pain. May repeat dose in 5 minutes. If chest pain not relieved than call 911.  Marland Kitchen PROAIR HFA 108 (90 Base) MCG/ACT inhaler INHALE TWO PUFFS BY MOUTH 4 TIMES DAILY AS NEEDED  . simvastatin (ZOCOR) 10 MG tablet TAKE ONE TABLET BY MOUTH ONE TIME DAILY  . [DISCONTINUED] doxycycline (VIBRA-TABS) 100 MG tablet Take 1 tablet (100 mg total) by mouth 2 (two) times daily. 1 po bid (Patient not taking: Reported on 01/05/2017)   No facility-administered encounter medications on file as of 01/05/2017.      Allergies (verified) Actonel [risedronate]; Fosamax [alendronate sodium]; Mevacor [lovastatin]; Miacalcin [calcitonin (salmon)]; and Mobic [meloxicam]   History: Past Medical History:  Diagnosis Date  . Anemia   . Asthma   . Brain tumor ([place])    1986  . Breast cancer ([place])    Right breast  . Cataract   . Colon polyps   . COPD (chronic obstructive pulmonary disease) ([place])   . Hiatal hernia   . Hyperlipidemia   . Leg edema   . Macular degeneration   . Osteoporosis   . PVD (posterior vitreous detachment)   . Right knee pain   . Shingles rash    Past Surgical History:  Procedure Laterality Date  . ABDOMINAL HYSTERECTOMY    . COLONOSCOPY    . CRANIECTOMY / CRANIOTOMY FOR EXCISION OF BRAIN TUMOR     Left side brain  . EYE SURGERY    . MASTECTOMY Right   . PARTIAL HYSTERECTOMY    . POLYPECTOMY    . SKIN CANCER EXCISION     Under left eye   Family History  Problem Relation Age of Onset  . Heart disease Mother 32       Stroke   . Dementia Mother   . Congestive Heart Failure Mother   . Osteoporosis Mother   . Heart disease Sister   . Hypertension Sister   . Diabetes Sister   . Osteoporosis Sister   . Heart  disease Brother   . Hyperlipidemia Brother   . Diabetes Brother   . Heart disease Sister   . Hypertension Sister   . Diabetes Sister   . Diabetes Sister   . Hypertension Sister   . Diabetes Sister   . Hypertension Sister   . Diabetes Brother   . Hyperlipidemia Brother   . Cancer Father        lung  . Heart attack Father   . Diabetes Brother   . Heart disease Brother   . Hyperlipidemia Brother   . Diabetes Brother   . Diabetes Son   . Colon cancer Neg Hx   . Rectal cancer Neg Hx   . Stomach cancer Neg Hx   . Esophageal cancer Neg Hx    Social History   Occupational History  . Not on file.   Social History Main Topics  . Smoking status: Former Smoker    Types: Cigarettes    Quit date: 02/09/1961  . Smokeless tobacco: Never Used  . Alcohol  use No  . Drug use: No  . Sexual activity: No    Do you feel safe at home?  Yes Are there smokers in your home (other than you)? No  Dietary issues and exercise activities: Current Exercise Habits: Home exercise routine;Structured exercise class, Type of exercise: strength training/weights;walking (going to physical therapy), Time (Minutes): 20, Frequency (Times/Week): 4, Weekly Exercise (Minutes/Week): 80, Intensity: Moderate  Current Dietary habits:  Mrs. Wisdom prepares meals at home.   She usually skips breakfast but eats lunch and supper on regular basis.  She limits CHO intake because her son is diabetic and has to prepare low CHO foods for him.     Objective:    Today's Vitals   01/05/17 1139  BP: 134/74  Pulse: 72  Weight: 170 lb (77.1 kg)  Height: 5\' 3"  (1.6 m)  PainSc: 0-No pain   Body mass index is 30.11 kg/m.  Activities of Daily Living In your present state of health, do you have any difficulty performing the following activities: 01/05/2017  Hearing? N  Vision? Y  Difficulty concentrating or making decisions? N  Walking or climbing stairs? N  Dressing or bathing? N  Doing errands, shopping? N  Preparing Food and eating ? N  Using the Toilet? N  In the past six months, have you accidently leaked urine? N  Do you have problems with loss of bowel control? N  Managing your Medications? N  Managing your Finances? N  Housekeeping or managing your Housekeeping? N  Some recent data might be hidden     Cardiac Risk Factors include: advanced age (>31men, >66 women);dyslipidemia;family history of premature cardiovascular disease;hypertension;obesity (BMI >30kg/m2)  Depression Screen PHQ 2/9 Scores 01/05/2017 12/01/2016 08/14/2016 04/08/2016  PHQ - 2 Score 0 0 0 0     Fall Risk Fall Risk  01/05/2017 12/01/2016 08/14/2016 04/08/2016 12/10/2015  Falls in the past year? No Yes No Yes Yes  Number falls in past yr: - 1 - 2 or more 1  Injury with Fall? - Yes - No -  Risk Factor  Category  - High Fall Risk - - -  Risk for fall due to : - - - - History of fall(s);Impaired balance/gait  Risk for fall due to (comments): - - - - -  Follow up - Falls prevention discussed - - -    Cognitive Function: MMSE - Mini Mental State Exam 01/05/2017 12/10/2015 10/18/2014  Orientation to time 5 5 5  Orientation to Place 5 5 5   Registration 3 3 3   Attention/ Calculation 5 5 5   Recall 2 3 3   Language- name 2 objects 2 2 2   Language- repeat 1 1 1   Language- follow 3 step command 1 3 3   Language- read & follow direction 1 1 1   Write a sentence 1 1 1   Copy design 1 1 1   Total score 27 30 30     Immunizations and Health Maintenance Immunization History  Administered Date(s) Administered  . Pneumococcal Conjugate-13 06/21/2013  . Pneumococcal Polysaccharide-23 04/26/2005   There are no preventive care reminders to display for this patient.  Patient Care Team: Chipper Herb, MD as PCP - General (Family Medicine) Milus Banister, MD (Gastroenterology) Deeann Saint, MD as Consulting Physician (Hematology and Oncology) Sherlynn Stalls, MD as Consulting Physician (Ophthalmology) Satira Sark, MD as Consulting Physician (Cardiology) Okey Regal, OD as Consulting Physician (Optometry)  Indicate any recent Medical Services you may have received from other than [facility] providers in the past year (date may be approximate).    Assessment:    Annual Wellness Visit    Screening Tests Health Maintenance  Topic Date Due  . PNA vac Low Risk Adult (2 of 2 - PPSV23) 03/14/2017 (Originally 06/21/2014)  . TETANUS/TDAP  04/14/2017 (Originally 11/03/2015)  . INFLUENZA VACCINE  04/08/2021 (Originally 03/04/2017)  . COLONOSCOPY  02/23/2017  . DEXA SCAN  11/21/2017        Plan:   During the course of the visit Manaal was educated and counseled about the following appropriate screening and preventive services:   Vaccines to include Pneumoccal, Influenza, Td and Shingles -  discussed Shinlgrix and Boostix vaccines - both are $3.70 - patient will get at next appt with PCP  Colorectal cancer screening - patient received letter last week from Dr Ardis Hughs to schedule appt - appt made in office today for patient - 02/09/17  Cardiovascular disease screening  Diabetes screening  Recommend decrease cetirizine to 5mg  qhs  Bone Denisty / Osteoporosis Screening - next due 08/2018  Mammogram - appt made today  Glaucoma screening / Eye Exam  Nutrition counseling  Advanced Directives  Physical Activity  Patient was suppose to also received Prolia today but she had not taken calls from [facility] and they could not ship until patient OK.  Called [facility] and help patient verify shipment.    Patient Instructions (the written plan) were given to the patient.   Cherre Robins, PharmD   01/05/2017

## 2017-01-05 NOTE — Patient Instructions (Addendum)
  Krystal [name] , Thank you for taking time to come for your Medicare Wellness Visit. I appreciate your ongoing commitment to your health goals. Please review the following plan we discussed and let me know if I can assist you in the future.   Your goal BMI (body mass index) is 29 or less.  Recommend decrease weight by 10 lbs.   Increase non-starchy vegetables - carrots, green bean, squash, zucchini, tomatoes, onions, peppers, spinach and other green leafy vegetables, cabbage, lettuce, cucumbers, asparagus, okra (not fried), eggplant Limit sugar and processed foods (cakes, cookies, ice cream, crackers and chips) Increase fresh fruit but limit serving sizes 1/2 cup or about the size of tennis or baseball Limit red meat to no more than 1-2 times per week (serving size about the size of your palm) Choose whole grains / lean proteins - whole wheat bread, quinoa, whole grain rice (1/2 cup), fish, chicken, [place] Avoid sugar and calorie containing beverages - soda, sweet tea and juice.  Choose water or unsweetened tea instead.  Increase physical activity - goal is to 150 minutes per week - is about 30 minutes per day.   Continue to exercise regularly  This is a list of the screening recommended for you and due dates:  Health Maintenance  Topic Date Due  . Pneumonia vaccines (2 of 2 - PPSV23) Completed   . Tetanus Vaccine  04/14/2017*  . Flu Shot  04/08/2021*  . Colon Cancer Screening  02/23/2017  . DEXA scan (bone density measurement)  11/21/2017  *Topic was postponed. The date shown is not the original due date.

## 2017-01-06 ENCOUNTER — Ambulatory Visit: Payer: Medicare Other | Admitting: Pharmacist

## 2017-01-07 ENCOUNTER — Ambulatory Visit: Payer: Self-pay | Admitting: Pharmacist

## 2017-01-08 ENCOUNTER — Ambulatory Visit: Payer: Medicare Other | Attending: Physical Medicine and Rehabilitation | Admitting: *Deleted

## 2017-01-08 DIAGNOSIS — M25561 Pain in right knee: Secondary | ICD-10-CM | POA: Insufficient documentation

## 2017-01-08 DIAGNOSIS — G8929 Other chronic pain: Secondary | ICD-10-CM

## 2017-01-08 DIAGNOSIS — M545 Low back pain: Secondary | ICD-10-CM | POA: Insufficient documentation

## 2017-01-08 DIAGNOSIS — M25562 Pain in left knee: Secondary | ICD-10-CM | POA: Insufficient documentation

## 2017-01-08 NOTE — Therapy (Signed)
[facility] [place], [place], [address] Phone: [phone]   Fax:  [phone]  Physical Therapy Treatment  Patient Details  Name: Krystal [name] MRN: [id number] Date of Birth: 02/11/1941 Referring Provider: Suella Broad, MD  Encounter Date: 01/08/2017      PT End of Session - 01/08/17 1445    Visit Number 12   Number of Visits 24   Date for PT Re-Evaluation 01/20/17   PT Start Time 2025   PT Stop Time 1444   PT Time Calculation (min) 59 min      Past Medical History:  Diagnosis Date  . Anemia   . Asthma   . Brain tumor ([place])    1986  . Breast cancer ([place])    Right breast  . Cataract   . Colon polyps   . COPD (chronic obstructive pulmonary disease) ([place])   . Hiatal hernia   . Hyperlipidemia   . Leg edema   . Macular degeneration   . Osteoporosis   . PVD (posterior vitreous detachment)   . Right knee pain   . Shingles rash     Past Surgical History:  Procedure Laterality Date  . ABDOMINAL HYSTERECTOMY    . COLONOSCOPY    . CRANIECTOMY / CRANIOTOMY FOR EXCISION OF BRAIN TUMOR     Left side brain  . EYE SURGERY    . MASTECTOMY Right   . PARTIAL HYSTERECTOMY    . POLYPECTOMY    . SKIN CANCER EXCISION     Under left eye    There were no vitals filed for this visit.      Subjective Assessment - 01/08/17 1347    Subjective Patient went to MD and is to continue theray 4 more weeks, 2-3/10 since starting PT                         [facility] Adult PT Treatment/Exercise - 01/08/17 0001      Lumbar Exercises: Machines for Strengthening   Cybex Lumbar Extension 10# 3x15   Cybex Knee Extension 30# 3x15     Knee/Hip Exercises: Aerobic   Nustep L6 x 94mn UE/LE, monitored for progression     Knee/Hip Exercises: Standing   Rocker Board 5 minutes  PF/DF and calf stretching     Moist Heat Therapy   Number Minutes Moist Heat 15 Minutes   Moist Heat Location Knee     Electrical  Stimulation   Electrical Stimulation Location R knee  IFC 80-'150hz'$  x 15 mins   Electrical Stimulation Goals Pain                  PT Short Term Goals - 10/16/16 1535      PT SHORT TERM GOAL #1   Title STG's=LTG's.           PT Long Term Goals - 12/17/16 1321      PT LONG TERM GOAL #1   Title Independent with a HEP.   Time 8   Period Weeks   Status Achieved     PT LONG TERM GOAL #2   Title Walk a mile for exercise with pain not > 3/10.   Time 8   Period Weeks   Status Partially Met  only walked a few blocks distance thus far yet is able to at 3/10     PT LONG TERM GOAL #3   Title Perform ADL's with pain not > 3/10.   Time  8   Period Weeks   Status Achieved               Plan - 01/08/17 1447    Clinical Impression Statement Pt arrived today feeling fairly well with minimal back pain and wated to focus on RT knee. She was able to perform all therex and acts with no increase in pain just fatigue. Reps were increased for knee flexion and extension and tolerated well.   Rehab Potential Good   PT Frequency 2x / week   PT Duration 8 weeks   PT Treatment/Interventions ADLs/Self Care Home Management;Electrical Stimulation;Moist Heat;Ultrasound;Gait training;Functional mobility training;Patient/family education;Neuromuscular re-education;Therapeutic exercise;Therapeutic activities;Manual techniques;Passive range of motion;Vasopneumatic Device   PT Next Visit Plan cont with POC for Pain-free bilateral quadriceps/hip and core strengthening, standing exercises as tolerated   PT Home Exercise Plan marching in place to build endurance and begin walking program   Consulted and Agree with Plan of Care Patient      Patient will benefit from skilled therapeutic intervention in order to improve the following deficits and impairments:  Pain, Decreased activity tolerance, Decreased strength, Decreased range of motion, Decreased mobility  Visit Diagnosis: Chronic  midline low back pain, with sciatica presence unspecified  Chronic pain of left knee  Chronic pain of right knee     Problem List Patient Active Problem List   Diagnosis Date Noted  . Influenza with pneumonia 10/30/2015  . CAP (community acquired pneumonia) 10/29/2015  . Abdominal aortic atherosclerosis ([place]) 04/10/2015  . PSVT (paroxysmal supraventricular tachycardia) ([place]) 05/10/2014  . Macular degeneration, age related, nonexudative 08/24/2013  . History of right mastectomy 07/13/2013  . Anemia, iron deficiency 06/21/2013  . Allergic rhinitis 06/21/2013  . Chronic low back pain 06/21/2013  . Malignant neoplasm of female breast ([place]) 11/22/2007  . Mixed hyperlipidemia 11/22/2007  . Anxiety state 11/22/2007  . DEPRESSION 11/22/2007  . Asthma 11/22/2007  . GERD 11/22/2007  . GASTRITIS, WITH HEMORRHAGE 08/14/2007  . DUODENITIS, WITHOUT HEMORRHAGE 08/14/2007  . HIATAL HERNIA 08/14/2007  . 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    RAMSEUR,CHRIS, PTA 01/08/2017, 2:56 PM  [facility] [address] [place], [place], [address] Phone: [phone]   Fax:  [phone]  Name: Krystal [name] MRN: [id number] Date of Birth: 1940/11/09

## 2017-01-13 DIAGNOSIS — H43813 Vitreous degeneration, bilateral: Secondary | ICD-10-CM | POA: Diagnosis not present

## 2017-01-13 DIAGNOSIS — H353124 Nonexudative age-related macular degeneration, left eye, advanced atrophic with subfoveal involvement: Secondary | ICD-10-CM | POA: Diagnosis not present

## 2017-01-13 DIAGNOSIS — H43392 Other vitreous opacities, left eye: Secondary | ICD-10-CM | POA: Diagnosis not present

## 2017-01-13 DIAGNOSIS — H353212 Exudative age-related macular degeneration, right eye, with inactive choroidal neovascularization: Secondary | ICD-10-CM | POA: Diagnosis not present

## 2017-01-15 ENCOUNTER — Ambulatory Visit: Payer: Medicare Other | Admitting: Physical Therapy

## 2017-01-15 ENCOUNTER — Encounter: Payer: Self-pay | Admitting: Physical Therapy

## 2017-01-15 DIAGNOSIS — M25561 Pain in right knee: Secondary | ICD-10-CM | POA: Diagnosis not present

## 2017-01-15 DIAGNOSIS — M545 Low back pain: Secondary | ICD-10-CM | POA: Diagnosis not present

## 2017-01-15 DIAGNOSIS — M25562 Pain in left knee: Secondary | ICD-10-CM

## 2017-01-15 DIAGNOSIS — G8929 Other chronic pain: Secondary | ICD-10-CM | POA: Diagnosis not present

## 2017-01-15 NOTE — Therapy (Signed)
[facility] [place], [place], [address] Phone: [phone]   Fax:  [phone]  Physical Therapy Treatment  Patient Details  Name: Krystal [name] MRN: [id number] Date of Birth: [birth year] Referring Provider: Suella Broad, MD  Encounter Date: 01/15/2017      PT End of Session - 01/15/17 1333    Visit Number 13   Number of Visits 24   Date for PT Re-Evaluation 01/20/17   PT Start Time 1300   PT Stop Time 1357   PT Time Calculation (min) 57 min   Activity Tolerance Patient tolerated treatment well   Behavior During Therapy [facility] for tasks assessed/performed      Past Medical History:  Diagnosis Date  . Anemia   . Asthma   . Brain tumor ([place])    1986  . Breast cancer ([place])    Right breast  . Cataract   . Colon polyps   . COPD (chronic obstructive pulmonary disease) ([place])   . Hiatal hernia   . Hyperlipidemia   . Leg edema   . Macular degeneration   . Osteoporosis   . PVD (posterior vitreous detachment)   . Right knee pain   . Shingles rash     Past Surgical History:  Procedure Laterality Date  . ABDOMINAL HYSTERECTOMY    . COLONOSCOPY    . CRANIECTOMY / CRANIOTOMY FOR EXCISION OF BRAIN TUMOR     Left side brain  . EYE SURGERY    . MASTECTOMY Right   . PARTIAL HYSTERECTOMY    . POLYPECTOMY    . SKIN CANCER EXCISION     Under left eye    There were no vitals filed for this visit.      Subjective Assessment - 01/15/17 1301    Subjective Patient reported doing good overall and good after last treatment   Pertinent History OP.   Patient Stated Goals Be able to walk a mile for exercise again.   Currently in Pain? Yes   Pain Score 1    Pain Location Knee   Pain Orientation Right   Pain Descriptors / Indicators Aching   Pain Type Chronic pain   Pain Onset More than a month ago   Pain Frequency Constant   Aggravating Factors  prolong activity   Pain Relieving Factors rest                          [facility] Adult PT Treatment/Exercise - 01/15/17 0001      Lumbar Exercises: Machines for Strengthening   Cybex Lumbar Extension 10# 3x15   Cybex Knee Extension 30# 3x15     Knee/Hip Exercises: Aerobic   Nustep L6-7 x 77mn UE/LE, monitored for progression     Knee/Hip Exercises: Standing   Heel Raises Both;2 sets;10 reps   Terminal Knee Extension Limitations x30 with pink XTS   Hip Abduction Stengthening;Right;20 reps  with red t-band     Knee/Hip Exercises: Supine   Bridges with BCardinal HealthStrengthening;Both;20 reps   Straight Leg Raise with External Rotation Strengthening;Both;2 sets;10 reps     Moist Heat Therapy   Number Minutes Moist Heat 15 Minutes   Moist Heat Location Knee     Electrical Stimulation   Electrical Stimulation Location R knee  IFC 80-'150hz'  x 15 mins   Electrical Stimulation Goals Pain                  PT Short Term Goals -  10/16/16 1535      PT SHORT TERM GOAL #1   Title STG's=LTG's.           PT Long Term Goals - 12/17/16 1321      PT LONG TERM GOAL #1   Title Independent with a HEP.   Time 8   Period Weeks   Status Achieved     PT LONG TERM GOAL #2   Title Walk a mile for exercise with pain not > 3/10.   Time 8   Period Weeks   Status Partially Met  only walked a few blocks distance thus far yet is able to at 3/10     PT LONG TERM GOAL #3   Title Perform ADL's with pain not > 3/10.   Time 8   Period Weeks   Status Achieved               Plan - 01/15/17 1336    Clinical Impression Statement Patient tolerated treatment well today. Patient able to progress activity tolerance and strengthening exercises with no reported pain increase. Patient continues to report doing HEP daily and able to perform her ADL's with greater ease. Patient goals progressing.    Rehab Potential Good   PT Frequency 2x / week   PT Duration 8 weeks   PT Treatment/Interventions ADLs/Self Care Home  Management;Electrical Stimulation;Moist Heat;Ultrasound;Gait training;Functional mobility training;Patient/family education;Neuromuscular re-education;Therapeutic exercise;Therapeutic activities;Manual techniques;Passive range of motion;Vasopneumatic Device   PT Next Visit Plan cont with POC for Pain-free bilateral quadriceps/hip and core strengthening, standing exercises as tolerated   Consulted and Agree with Plan of Care Patient      Patient will benefit from skilled therapeutic intervention in order to improve the following deficits and impairments:  Pain, Decreased activity tolerance, Decreased strength, Decreased range of motion, Decreased mobility  Visit Diagnosis: Chronic midline low back pain, with sciatica presence unspecified  Chronic pain of left knee  Chronic pain of right knee     Problem List Patient Active Problem List   Diagnosis Date Noted  . Influenza with pneumonia 10/30/2015  . CAP (community acquired pneumonia) 10/29/2015  . Abdominal aortic atherosclerosis ([place]) 04/10/2015  . PSVT (paroxysmal supraventricular tachycardia) ([place]) 05/10/2014  . Macular degeneration, age related, nonexudative 08/24/2013  . History of right mastectomy 07/13/2013  . Anemia, iron deficiency 06/21/2013  . Allergic rhinitis 06/21/2013  . Chronic low back pain 06/21/2013  . Malignant neoplasm of female breast ([place]) 11/22/2007  . Mixed hyperlipidemia 11/22/2007  . Anxiety state 11/22/2007  . DEPRESSION 11/22/2007  . Asthma 11/22/2007  . GERD 11/22/2007  . GASTRITIS, WITH HEMORRHAGE 08/14/2007  . DUODENITIS, WITHOUT HEMORRHAGE 08/14/2007  . HIATAL HERNIA 08/14/2007  . 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    Maille Halliwell P, PTA 01/15/2017, 1:58 PM  [facility] [address] [place], [place], [address] Phone: [phone]   Fax:  [phone]  Name: Krystal [name] MRN: [id number] Date of Birth: [birth year]

## 2017-01-19 ENCOUNTER — Encounter: Payer: Self-pay | Admitting: Family Medicine

## 2017-01-19 ENCOUNTER — Ambulatory Visit (INDEPENDENT_AMBULATORY_CARE_PROVIDER_SITE_OTHER): Payer: Medicare Other

## 2017-01-19 ENCOUNTER — Ambulatory Visit (INDEPENDENT_AMBULATORY_CARE_PROVIDER_SITE_OTHER): Payer: Medicare Other | Admitting: Family Medicine

## 2017-01-19 VITALS — BP 106/67 | HR 87 | Temp 97.7°F | Ht 63.0 in | Wt 167.0 lb

## 2017-01-19 DIAGNOSIS — E559 Vitamin D deficiency, unspecified: Secondary | ICD-10-CM

## 2017-01-19 DIAGNOSIS — E7800 Pure hypercholesterolemia, unspecified: Secondary | ICD-10-CM | POA: Diagnosis not present

## 2017-01-19 DIAGNOSIS — I10 Essential (primary) hypertension: Secondary | ICD-10-CM | POA: Diagnosis not present

## 2017-01-19 DIAGNOSIS — R5383 Other fatigue: Secondary | ICD-10-CM | POA: Diagnosis not present

## 2017-01-19 DIAGNOSIS — I700 Atherosclerosis of aorta: Secondary | ICD-10-CM | POA: Diagnosis not present

## 2017-01-19 DIAGNOSIS — D509 Iron deficiency anemia, unspecified: Secondary | ICD-10-CM | POA: Diagnosis not present

## 2017-01-19 DIAGNOSIS — R200 Anesthesia of skin: Secondary | ICD-10-CM | POA: Diagnosis not present

## 2017-01-19 DIAGNOSIS — M810 Age-related osteoporosis without current pathological fracture: Secondary | ICD-10-CM

## 2017-01-19 MED ORDER — DENOSUMAB 60 MG/ML ~~LOC~~ SOLN
60.0000 mg | Freq: Once | SUBCUTANEOUS | Status: AC
  Administered 2017-01-19: 60 mg via SUBCUTANEOUS

## 2017-01-19 NOTE — Progress Notes (Signed)
 Subjective:    Patient ID: Krystal [name], female    DOB: 07/10/1941, 76 y.o.   MRN: [id number]  HPI Pt here for follow up and management of chronic medical problems which includes hyperlipidemia and hypertension. She is taking medication regularly.The patient today does complain of right hand numbness. She's been getting physical therapy. Physical therapy was for chronic midline low back pain. Today she is complaining with some numbness in the right hand. She will get lab work today and will be given an FOBT to return. She has had a recent tick bite and a bout with bronchitis recently. The patient denies any chest pain but does have a lot of tightness  that comes and goes not related to activity. She denies any shortness of breath. She denies any trouble with her stomach including nausea vomiting diarrhea blood in the stool or black tarry bowel movements. She does have nocturia 3-4 times. She says her bowel movements occur only once or twice weekly. She does complain of this right hand numbness which is worse in the first 4 fingers. We will get C-spine films and do carpal tunnel testing for possible carpal tunnel syndrome. The low back pain has improved with therapy. She is over the bronchitic episode she has had and the tick bite issues that she had had.    Patient Active Problem List   Diagnosis Date Noted  . Influenza with pneumonia 10/30/2015  . CAP (community acquired pneumonia) 10/29/2015  . Abdominal aortic atherosclerosis (HCC) 04/10/2015  . PSVT (paroxysmal supraventricular tachycardia) (HCC) 05/10/2014  . Macular degeneration, age related, nonexudative 08/24/2013  . History of right mastectomy 07/13/2013  . Anemia, iron deficiency 06/21/2013  . Allergic rhinitis 06/21/2013  . Chronic low back pain 06/21/2013  . Malignant neoplasm of female breast (HCC) 11/22/2007  . Mixed hyperlipidemia 11/22/2007  . Anxiety state 11/22/2007  . DEPRESSION 11/22/2007  . Asthma 11/22/2007  .  GERD 11/22/2007  . GASTRITIS, WITH HEMORRHAGE 08/14/2007  . DUODENITIS, WITHOUT HEMORRHAGE 08/14/2007  . HIATAL HERNIA 08/14/2007  . 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   Outpatient Encounter Prescriptions as of 01/19/2017  Medication Sig  . aspirin EC 81 MG tablet Take 81 mg by mouth at bedtime.  . Calcium Citrate-Vitamin D (CALCIUM CITRATE + D) 315-250 MG-UNIT TABS Take 2 tablets by mouth daily.  . cetirizine (ZYRTEC) 10 MG tablet Take 5-10 mg by mouth at bedtime.   . Cholecalciferol (VITAMIN D) 2000 UNITS CAPS Take 1 capsule by mouth at bedtime.  . diazepam (VALIUM) 5 MG tablet Take 1 tablet (5 mg total) by mouth every 6 (six) hours as needed.  . diltiazem (CARDIZEM CD) 240 MG 24 hr capsule TAKE ONE CAPSULE BY MOUTH ONE TIME DAILY  . esomeprazole (NEXIUM) 40 MG capsule TAKE 1 CAPSULE (40 MG TOTAL) BY MOUTH DAILY.  . ferrous sulfate 325 (65 FE) MG tablet Take 325 mg by mouth at bedtime.  . montelukast (SINGULAIR) 10 MG tablet TAKE 1 TABLET (10 MG TOTAL) BY MOUTH AT BEDTIME.  . Multiple Vitamins-Minerals (PRESERVISION/LUTEIN PO) Take 1 capsule by mouth at bedtime.   . nitroGLYCERIN (NITROLINGUAL) 0.4 MG/SPRAY spray Use one spray under tongue as needed for chest pain. May repeat dose in 5 minutes. If chest pain not relieved than call 911.  . PROAIR HFA 108 (90 Base) MCG/ACT inhaler INHALE TWO PUFFS BY MOUTH 4 TIMES DAILY AS NEEDED  . simvastatin (ZOCOR) 10 MG tablet TAKE ONE TABLET BY MOUTH ONE TIME DAILY   No facility-administered encounter   medications on file as of 01/19/2017.       Review of Systems  Constitutional: Negative.   HENT: Negative.   Eyes: Negative.   Respiratory: Negative.   Cardiovascular: Negative.   Gastrointestinal: Negative.   Endocrine: Negative.   Genitourinary: Negative.   Musculoskeletal: Negative.   Skin: Negative.   Allergic/Immunologic: Negative.   Neurological: Positive for numbness (right hand).  Hematological: Negative.   Psychiatric/Behavioral:  Negative.        Objective:   Physical Exam  Constitutional: She is oriented to person, place, and time. She appears well-developed and well-nourished. No distress.  The patient is calm and pleasant and alert  HENT:  Head: Normocephalic and atraumatic.  Right Ear: External ear normal.  Left Ear: External ear normal.  Nose: Nose normal.  Mouth/Throat: Oropharynx is clear and moist. No oropharyngeal exudate.  Eyes: Conjunctivae and EOM are normal. Pupils are equal, round, and reactive to light. Right eye exhibits no discharge. Left eye exhibits no discharge. No scleral icterus.  Neck: Normal range of motion. Neck supple. No thyromegaly present.  No bruits thyromegaly or anterior cervical adenopathy  Cardiovascular: Normal rate, regular rhythm, normal heart sounds and intact distal pulses.   No murmur heard. The heart is regular at 84/m.  Pulmonary/Chest: Effort normal and breath sounds normal. No respiratory distress. She has no wheezes. She has no rales.  Right mastectomy Clear anteriorly and posteriorly  Abdominal: Soft. Bowel sounds are normal. She exhibits no mass. There is no tenderness. There is no rebound and no guarding.  No abdominal tenderness masses or organ enlargement or bruits  Musculoskeletal: Normal range of motion. She exhibits no edema.  Lymphadenopathy:    She has no cervical adenopathy.  Neurological: She is alert and oriented to person, place, and time. She has normal reflexes. No cranial nerve deficit.  Upper extremity and lower extremity reflexes were normal. With tapping the right volar wrist surface the patient feels tingling in the fingers of her hand. There might be slight muscle atrophy of the right thenar muscle.  Skin: Skin is warm and dry.  Psychiatric: She has a normal mood and affect. Her behavior is normal. Judgment and thought content normal.  Nursing note and vitals reviewed.  BP 106/67 (BP Location: Left Arm)   Pulse 87   Temp 97.7 F (36.5 C)  (Oral)   Ht 5' 3" (1.6 m)   Wt 167 lb (75.8 kg)   BMI 29.58 kg/m    C-spine films are pending     Assessment & Plan:  1. Pure hypercholesterolemia -Continue with aggressive therapeutic lifestyle changes - CBC with Differential/Platelet - Lipid panel  2. Essential hypertension -Blood pressure is good today and she will continue with current treatment - BMP8+EGFR - CBC with Differential/Platelet - Hepatic function panel  3. Vitamin D deficiency -Continue with current treatment pending results of lab work - CBC with Differential/Platelet - VITAMIN D 25 Hydroxy (Vit-D Deficiency, Fractures) - Vitamin B12  4. Iron deficiency anemia, unspecified iron deficiency anemia type - CBC with Differential/Platelet - Vitamin B12  5. Other fatigue - Vitamin B12  6. Numbness of right hand -Arrange for PNVC testing for possible carpal tunnel syndrome - Vitamin B12  7. Thoracic aortic atherosclerosis (HCC) -Continue with aggressive therapeutic lifestyle changes and simvastatin pending results of lab work  8. Abdominal aortic atherosclerosis (HCC) -Continue with aggressive therapeutic lifestyle changes and simvastatin.  Patient Instructions                         Medicare Annual Wellness Visit  [facility] and the medical providers at [facility] strive to bring you the best medical care.  In doing so we not only want to address your current medical conditions and concerns but also to detect new conditions early and prevent illness, disease and health-related problems.    Medicare offers a yearly Wellness Visit which allows our clinical staff to assess your need for preventative services including immunizations, lifestyle education, counseling to decrease risk of preventable diseases and screening for fall risk and other medical concerns.    This visit is provided free of charge (no copay) for all Medicare recipients. The clinical pharmacists at [facility] have begun to conduct these Wellness Visits which will also include a thorough review of all your medications.    As you primary medical provider recommend that you make an appointment for your Annual Wellness Visit if you have not done so already this year.  You may set up this appointment before you leave today or you may call back ([phone]) and schedule an appointment.  Please make sure when you call that you mention that you are scheduling your Annual Wellness Visit with the clinical pharmacist so that the appointment may be made for the proper length of time.     Continue current medications. Continue good therapeutic lifestyle changes which include good diet and exercise. Fall precautions discussed with patient. If an FOBT was given today- please return it to our front desk. If you are over 4 years old - you may need Prevnar 23 or the adult Pneumonia vaccine.  **Flu shots are available--- please call and schedule a FLU-CLINIC appointment**  After your visit with [place] today you will receive a survey in the mail or online from [employer] regarding your care with [place]. Please take a moment to fill this out. Your feedback is very important to [place] as you can help [place] better understand your patient needs as well as improve your experience and satisfaction. WE CARE ABOUT YOU!!!   We will arrange for peripheral nerve conduction velocity testing because of the numbness you're having in the right hand We'll get lab work and C-spine films and we'll call you with all of these results as soon as they become available   Arrie Senate MD

## 2017-01-19 NOTE — Patient Instructions (Addendum)
Medicare Annual Wellness Visit  [facility] and the medical providers at [facility] strive to bring you the best medical care.  In doing so we not only want to address your current medical conditions and concerns but also to detect new conditions early and prevent illness, disease and health-related problems.    Medicare offers a yearly Wellness Visit which allows our clinical staff to assess your need for preventative services including immunizations, lifestyle education, counseling to decrease risk of preventable diseases and screening for fall risk and other medical concerns.    This visit is provided free of charge (no copay) for all Medicare recipients. The clinical pharmacists at [facility] have begun to conduct these Wellness Visits which will also include a thorough review of all your medications.    As you primary medical provider recommend that you make an appointment for your Annual Wellness Visit if you have not done so already this year.  You may set up this appointment before you leave today or you may call back ([phone]) and schedule an appointment.  Please make sure when you call that you mention that you are scheduling your Annual Wellness Visit with the clinical pharmacist so that the appointment may be made for the proper length of time.     Continue current medications. Continue good therapeutic lifestyle changes which include good diet and exercise. Fall precautions discussed with patient. If an FOBT was given today- please return it to our front desk. If you are over 54 years old - you may need Prevnar 29 or the adult Pneumonia vaccine.  **Flu shots are available--- please call and schedule a FLU-CLINIC appointment**  After your visit with [place] today you will receive a survey in the mail or online from [employer] regarding your care with [place]. Please take a moment to fill this out. Your feedback is very  important to [place] as you can help [place] better understand your patient needs as well as improve your experience and satisfaction. WE CARE ABOUT YOU!!!   We will arrange for peripheral nerve conduction velocity testing because of the numbness you're having in the right hand We'll get lab work and C-spine films and we'll call you with all of these results as soon as they become available

## 2017-01-20 ENCOUNTER — Telehealth: Payer: Self-pay | Admitting: Family Medicine

## 2017-01-20 LAB — LIPID PANEL
CHOL/HDL RATIO: 2.7 ratio (ref 0.0–4.4)
Cholesterol, Total: 180 mg/dL (ref 100–199)
HDL: 67 mg/dL (ref >39)
LDL Calculated: 88 mg/dL (ref 0–99)
Triglycerides: 126 mg/dL (ref 0–149)
VLDL CHOLESTEROL CAL: 25 mg/dL (ref 5–40)

## 2017-01-20 LAB — CBC WITH DIFFERENTIAL/PLATELET
BASOS: 0 %
Basophils Absolute: 0 10*3/uL (ref 0.0–0.2)
EOS (ABSOLUTE): 0.2 10*3/uL (ref 0.0–0.4)
EOS: 3 %
HEMATOCRIT: 42.9 % (ref 34.0–46.6)
Hemoglobin: 14.1 g/dL (ref 11.1–15.9)
IMMATURE GRANS (ABS): 0 10*3/uL (ref 0.0–0.1)
Immature Granulocytes: 0 %
LYMPHS: 30 %
Lymphocytes Absolute: 2.1 10*3/uL (ref 0.7–3.1)
MCH: 30.1 pg (ref 26.6–33.0)
MCHC: 32.9 g/dL (ref 31.5–35.7)
MCV: 92 fL (ref 79–97)
Monocytes Absolute: 0.4 10*3/uL (ref 0.1–0.9)
Monocytes: 6 %
NEUTROS ABS: 4.4 10*3/uL (ref 1.4–7.0)
Neutrophils: 61 %
PLATELETS: 193 10*3/uL (ref 150–379)
RBC: 4.68 x10E6/uL (ref 3.77–5.28)
RDW: 13.6 % (ref 12.3–15.4)
WBC: 7.1 10*3/uL (ref 3.4–10.8)

## 2017-01-20 LAB — HEPATIC FUNCTION PANEL
ALBUMIN: 4.6 g/dL (ref 3.5–4.8)
ALK PHOS: 49 IU/L (ref 39–117)
ALT: 12 IU/L (ref 0–32)
AST: 17 IU/L (ref 0–40)
Bilirubin Total: 0.3 mg/dL (ref 0.0–1.2)
Bilirubin, Direct: 0.1 mg/dL (ref 0.00–0.40)
TOTAL PROTEIN: 6.7 g/dL (ref 6.0–8.5)

## 2017-01-20 LAB — BMP8+EGFR
BUN/Creatinine Ratio: 22 (ref 12–28)
BUN: 17 mg/dL (ref 8–27)
CALCIUM: 10.3 mg/dL (ref 8.7–10.3)
CO2: 24 mmol/L (ref 20–29)
CREATININE: 0.78 mg/dL (ref 0.57–1.00)
Chloride: 101 mmol/L (ref 96–106)
GFR, EST AFRICAN AMERICAN: 85 mL/min/{1.73_m2} (ref >59)
GFR, EST NON AFRICAN AMERICAN: 74 mL/min/{1.73_m2} (ref >59)
Glucose: 75 mg/dL (ref 65–99)
POTASSIUM: 4.4 mmol/L (ref 3.5–5.2)
SODIUM: 142 mmol/L (ref 134–144)

## 2017-01-20 LAB — VITAMIN B12: VITAMIN B 12: 277 pg/mL (ref 232–1245)

## 2017-01-20 LAB — VITAMIN D 25 HYDROXY (VIT D DEFICIENCY, FRACTURES): VIT D 25 HYDROXY: 43.3 ng/mL (ref 30.0–100.0)

## 2017-01-20 NOTE — Telephone Encounter (Signed)
Refused to leave message

## 2017-01-22 ENCOUNTER — Other Ambulatory Visit: Payer: Self-pay | Admitting: *Deleted

## 2017-01-22 ENCOUNTER — Ambulatory Visit: Payer: Medicare Other | Admitting: Physical Therapy

## 2017-01-22 ENCOUNTER — Encounter: Payer: Self-pay | Admitting: Neurology

## 2017-01-22 ENCOUNTER — Encounter: Payer: Self-pay | Admitting: Physical Therapy

## 2017-01-22 DIAGNOSIS — M25562 Pain in left knee: Secondary | ICD-10-CM | POA: Diagnosis not present

## 2017-01-22 DIAGNOSIS — M545 Low back pain: Principal | ICD-10-CM

## 2017-01-22 DIAGNOSIS — R200 Anesthesia of skin: Secondary | ICD-10-CM

## 2017-01-22 DIAGNOSIS — G8929 Other chronic pain: Secondary | ICD-10-CM

## 2017-01-22 DIAGNOSIS — M25561 Pain in right knee: Secondary | ICD-10-CM | POA: Diagnosis not present

## 2017-01-22 NOTE — Therapy (Signed)
[facility] [place], [place], [address] Phone: [phone]   Fax:  [phone]  Physical Therapy Treatment  Patient Details  Name: Krystal [name] MRN: [id number] Date of Birth: 1940/11/16 Referring Provider: Suella Broad, MD  Encounter Date: 01/22/2017      PT End of Session - 01/22/17 1313    Visit Number 14   Number of Visits 24   Date for PT Re-Evaluation 01/20/17   PT Start Time 3428   PT Stop Time 1338   PT Time Calculation (min) 57 min   Activity Tolerance Patient tolerated treatment well   Behavior During Therapy [facility] for tasks assessed/performed      Past Medical History:  Diagnosis Date  . Anemia   . Asthma   . Brain tumor ([place])    1986  . Breast cancer ([place])    Right breast  . Cataract   . Colon polyps   . COPD (chronic obstructive pulmonary disease) ([place])   . Hiatal hernia   . Hyperlipidemia   . Leg edema   . Macular degeneration   . Osteoporosis   . PVD (posterior vitreous detachment)   . Right knee pain   . Shingles rash     Past Surgical History:  Procedure Laterality Date  . ABDOMINAL HYSTERECTOMY    . COLONOSCOPY    . CRANIECTOMY / CRANIOTOMY FOR EXCISION OF BRAIN TUMOR     Left side brain  . EYE SURGERY    . MASTECTOMY Right   . PARTIAL HYSTERECTOMY    . POLYPECTOMY    . SKIN CANCER EXCISION     Under left eye    There were no vitals filed for this visit.      Subjective Assessment - 01/22/17 1247    Subjective Patient reported doing good overall and good after last treatment   Pertinent History OP.   Patient Stated Goals Be able to walk a mile for exercise again.   Currently in Pain? Yes   Pain Score 1    Pain Location Knee   Pain Orientation Right   Pain Descriptors / Indicators Aching   Pain Type Chronic pain   Pain Onset More than a month ago   Pain Frequency Constant   Aggravating Factors  prolong activity   Pain Relieving Factors rest                          [facility] Adult PT Treatment/Exercise - 01/22/17 0001      Lumbar Exercises: Machines for Strengthening   Cybex Lumbar Extension 10# 3x15   Cybex Knee Extension 30# 3x15     Knee/Hip Exercises: Aerobic   Nustep L6-7 x 42mn UE/LE, monitored for progression     Knee/Hip Exercises: Standing   Heel Raises Both;2 sets;10 reps   Terminal Knee Extension Limitations x30 with pink XTS   Hip Abduction Stengthening;Right;20 reps  red t-band     Knee/Hip Exercises: Supine   Bridges with BCardinal HealthStrengthening;Both;20 reps   Straight Leg Raise with External Rotation Strengthening;Both;2 sets;10 reps     Knee/Hip Exercises: Sidelying   Hip ABduction Strengthening;2 sets;10 reps;Right   Clams 2x10     Moist Heat Therapy   Number Minutes Moist Heat 15 Minutes   Moist Heat Location Knee     Electrical Stimulation   Electrical Stimulation Location R knee  IFC 80-_0  x 15 mins   Electrical Stimulation Goals Pain  PT Short Term Goals - 10/16/16 1535      PT SHORT TERM GOAL #1   Title STG's=LTG's.           PT Long Term Goals - 12/17/16 1321      PT LONG TERM GOAL #1   Title Independent with a HEP.   Time 8   Period Weeks   Status Achieved     PT LONG TERM GOAL #2   Title Walk a mile for exercise with pain not > 3/10.   Time 8   Period Weeks   Status Partially Met  only walked a few blocks distance thus far yet is able to at 3/10     PT LONG TERM GOAL #3   Title Perform ADL's with pain not > 3/10.   Time 8   Period Weeks   Status Achieved               Plan - 01/22/17 1322    Clinical Impression Statement Patient tolerated treatment well today. Patient continues to progress with strengthening and feels improvement overall. Patient close to meeting all goals, some pain deficts with prolong waking at this time. Patient would like to continue strengthening  for a few more weeks then DC.    Rehab  Potential Good   PT Frequency 2x / week   PT Duration 8 weeks   PT Next Visit Plan cont with POC for Pain-free bilateral quadriceps/hip and core strengthening, standing exercises as tolerated (re-cert sent today)   Consulted and Agree with Plan of Care Patient      Patient will benefit from skilled therapeutic intervention in order to improve the following deficits and impairments:  Pain, Decreased activity tolerance, Decreased strength, Decreased range of motion, Decreased mobility  Visit Diagnosis: Chronic midline low back pain, with sciatica presence unspecified  Chronic pain of left knee  Chronic pain of right knee     Problem List Patient Active Problem List   Diagnosis Date Noted  . Influenza with pneumonia 10/30/2015  . CAP (community acquired pneumonia) 10/29/2015  . Abdominal aortic atherosclerosis ([place]) 04/10/2015  . PSVT (paroxysmal supraventricular tachycardia) ([place]) 05/10/2014  . Macular degeneration, age related, nonexudative 08/24/2013  . History of right mastectomy 07/13/2013  . Anemia, iron deficiency 06/21/2013  . Allergic rhinitis 06/21/2013  . Chronic low back pain 06/21/2013  . Malignant neoplasm of female breast ([place]) 11/22/2007  . Mixed hyperlipidemia 11/22/2007  . Anxiety state 11/22/2007  . DEPRESSION 11/22/2007  . Asthma 11/22/2007  . GERD 11/22/2007  . GASTRITIS, WITH HEMORRHAGE 08/14/2007  . DUODENITIS, WITHOUT HEMORRHAGE 08/14/2007  . HIATAL HERNIA 08/14/2007  . 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    Ladean Raya, PTA 01/22/17 1:38 PM   [facility] [place], [place], [address] Phone: [phone]   Fax:  [phone]  Name: Krystal [name] MRN: [id number] Date of Birth: 17-Aug-1940

## 2017-01-23 ENCOUNTER — Encounter: Payer: Self-pay | Admitting: Physician Assistant

## 2017-01-23 ENCOUNTER — Ambulatory Visit (INDEPENDENT_AMBULATORY_CARE_PROVIDER_SITE_OTHER): Payer: Medicare Other | Admitting: Physician Assistant

## 2017-01-23 VITALS — BP 123/67 | HR 92 | Temp 97.5°F | Ht 63.0 in | Wt 168.2 lb

## 2017-01-23 DIAGNOSIS — S30860A Insect bite (nonvenomous) of lower back and pelvis, initial encounter: Secondary | ICD-10-CM | POA: Diagnosis not present

## 2017-01-23 DIAGNOSIS — W57XXXA Bitten or stung by nonvenomous insect and other nonvenomous arthropods, initial encounter: Secondary | ICD-10-CM | POA: Diagnosis not present

## 2017-01-23 MED ORDER — AMOXICILLIN 500 MG PO CAPS: 500.0000 mg | ORAL_CAPSULE | Freq: Three times a day (TID) | ORAL | 0 refills | Status: DC

## 2017-01-23 NOTE — Progress Notes (Signed)
BP 123/67   Pulse 92   Temp 97.5 F (36.4 C) (Oral)   Ht 5\' 3"  (1.6 m)   Wt 168 lb 3.2 oz (76.3 kg)   BMI 29.80 kg/m    Subjective:    Patient ID: Krystal [name], female    DOB: 05/02/1941, 76 y.o.   MRN: [id number]  HPI: Krystal [name] is a 76 y.o. female presenting on 01/23/2017 for Tick Removal  Earlier today got tick off her low back. Son had to remove it, was a deer tick. Had been itching before hand. Denies fever, chills, NVD, Headache. Redness  Is around the bite. Does not know how long it was there. Is active outside.   Relevant past medical, surgical, family and social history reviewed and updated as indicated. Allergies and medications reviewed and updated.  Past Medical History:  Diagnosis Date  . Anemia   . Asthma   . Brain tumor ([place])    1986  . Breast cancer ([place])    Right breast  . Cataract   . Colon polyps   . COPD (chronic obstructive pulmonary disease) ([place])   . Hiatal hernia   . Hyperlipidemia   . Leg edema   . Macular degeneration   . Osteoporosis   . PVD (posterior vitreous detachment)   . Right knee pain   . Shingles rash     Past Surgical History:  Procedure Laterality Date  . ABDOMINAL HYSTERECTOMY    . COLONOSCOPY    . CRANIECTOMY / CRANIOTOMY FOR EXCISION OF BRAIN TUMOR     Left side brain  . EYE SURGERY    . MASTECTOMY Right   . PARTIAL HYSTERECTOMY    . POLYPECTOMY    . SKIN CANCER EXCISION     Under left eye    Review of Systems  Constitutional: Negative.  Negative for activity change, fatigue and fever.  HENT: Negative.   Eyes: Negative.   Respiratory: Negative.  Negative for cough.   Cardiovascular: Negative.  Negative for chest pain.  Gastrointestinal: Negative.  Negative for abdominal pain.  Endocrine: Negative.   Genitourinary: Negative.  Negative for dysuria.  Musculoskeletal: Negative.   Skin: Positive for rash.  Neurological: Negative.     Allergies as of 01/23/2017      Reactions   Actonel  [risedronate] Nausea And Vomiting, Other (See Comments)   Throat tightness   Fosamax [alendronate Sodium] Other (See Comments)   esophagitis   Mevacor [lovastatin] Other (See Comments)   myalgias   Miacalcin [calcitonin ([place])] Nausea And Vomiting   Mobic [meloxicam] Hives, Itching   rash      Medication List       Accurate as of 01/23/17  2:12 PM. Always use your most recent med list.          amoxicillin 500 MG capsule Commonly known as:  AMOXIL Take 1 capsule (500 mg total) by mouth 3 (three) times daily.   aspirin EC 81 MG tablet Take 81 mg by mouth at bedtime.   Calcium Citrate-Vitamin D 315-250 MG-UNIT Tabs Commonly known as:  CALCIUM CITRATE + D Take 2 tablets by mouth daily.   cetirizine 10 MG tablet Commonly known as:  ZYRTEC Take 5-10 mg by mouth at bedtime.   diazepam 5 MG tablet Commonly known as:  VALIUM Take 1 tablet (5 mg total) by mouth every 6 (six) hours as needed.   diltiazem 240 MG 24 hr capsule Commonly known as:  CARDIZEM CD TAKE ONE CAPSULE BY  MOUTH ONE TIME DAILY   esomeprazole 40 MG capsule Commonly known as:  NEXIUM TAKE 1 CAPSULE (40 MG TOTAL) BY MOUTH DAILY.   ferrous sulfate 325 (65 FE) MG tablet Take 325 mg by mouth at bedtime.   montelukast 10 MG tablet Commonly known as:  SINGULAIR TAKE 1 TABLET (10 MG TOTAL) BY MOUTH AT BEDTIME.   nitroGLYCERIN 0.4 MG/SPRAY spray Commonly known as:  NITROLINGUAL Use one spray under tongue as needed for chest pain. May repeat dose in 5 minutes. If chest pain not relieved than call 911.   PRESERVISION/LUTEIN PO Take 1 capsule by mouth at bedtime.   PROAIR HFA 108 (90 Base) MCG/ACT inhaler Generic drug:  albuterol INHALE TWO PUFFS BY MOUTH 4 TIMES DAILY AS NEEDED   simvastatin 10 MG tablet Commonly known as:  ZOCOR TAKE ONE TABLET BY MOUTH ONE TIME DAILY   Vitamin D 2000 units Caps Take 1 capsule by mouth at bedtime.          Objective:    BP 123/67   Pulse 92   Temp 97.5 F  (36.4 C) (Oral)   Ht 5\' 3"  (1.6 m)   Wt 168 lb 3.2 oz (76.3 kg)   BMI 29.80 kg/m   Allergies  Allergen Reactions  . Actonel [Risedronate] Nausea And Vomiting and Other (See Comments)    Throat tightness  . Fosamax [Alendronate Sodium] Other (See Comments)    esophagitis  . Mevacor [Lovastatin] Other (See Comments)    myalgias  . Miacalcin [Calcitonin ([place])] Nausea And Vomiting  . Mobic [Meloxicam] Hives and Itching    rash    Physical Exam  Constitutional: She is oriented to person, place, and time. She appears well-developed and well-nourished.  HENT:  Head: Normocephalic and atraumatic.  Eyes: Conjunctivae and EOM are normal. Pupils are equal, round, and reactive to light.  Cardiovascular: Normal rate, regular rhythm, normal heart sounds and intact distal pulses.   Pulmonary/Chest: Effort normal and breath sounds normal.  Abdominal: Soft. Bowel sounds are normal.  Neurological: She is alert and oriented to person, place, and time. She has normal reflexes.  Skin: Skin is warm and dry. Lesion noted. No rash noted. There is erythema.     Psychiatric: She has a normal mood and affect. Her behavior is normal. Judgment and thought content normal.        Assessment & Plan:   1. Tick bite, initial encounter Amoxicillin 500 mg 1 TID 10 days   Current Outpatient Prescriptions:  .  aspirin EC 81 MG tablet, Take 81 mg by mouth at bedtime., Disp: , Rfl:  .  Calcium Citrate-Vitamin D (CALCIUM CITRATE + D) 315-250 MG-UNIT TABS, Take 2 tablets by mouth daily., Disp: 120 tablet, Rfl:  .  cetirizine (ZYRTEC) 10 MG tablet, Take 5-10 mg by mouth at bedtime. , Disp: , Rfl:  .  Cholecalciferol (VITAMIN D) 2000 UNITS CAPS, Take 1 capsule by mouth at bedtime., Disp: , Rfl:  .  diazepam (VALIUM) 5 MG tablet, Take 1 tablet (5 mg total) by mouth every 6 (six) hours as needed., Disp: 30 tablet, Rfl: 5 .  diltiazem (CARDIZEM CD) 240 MG 24 hr capsule, TAKE ONE CAPSULE BY MOUTH ONE TIME DAILY,  Disp: 90 capsule, Rfl: 3 .  esomeprazole (NEXIUM) 40 MG capsule, TAKE 1 CAPSULE (40 MG TOTAL) BY MOUTH DAILY., Disp: 90 capsule, Rfl: 0 .  ferrous sulfate 325 (65 FE) MG tablet, Take 325 mg by mouth at bedtime., Disp: , Rfl:  .  montelukast (SINGULAIR) 10 MG tablet, TAKE 1 TABLET (10 MG TOTAL) BY MOUTH AT BEDTIME., Disp: 90 tablet, Rfl: 0 .  Multiple Vitamins-Minerals (PRESERVISION/LUTEIN PO), Take 1 capsule by mouth at bedtime. , Disp: , Rfl:  .  nitroGLYCERIN (NITROLINGUAL) 0.4 MG/SPRAY spray, Use one spray under tongue as needed for chest pain. May repeat dose in 5 minutes. If chest pain not relieved than call 911., Disp: 12 g, Rfl: 1 .  PROAIR HFA 108 (90 Base) MCG/ACT inhaler, INHALE TWO PUFFS BY MOUTH 4 TIMES DAILY AS NEEDED, Disp: 9 g, Rfl: 2 .  simvastatin (ZOCOR) 10 MG tablet, TAKE ONE TABLET BY MOUTH ONE TIME DAILY, Disp: 90 tablet, Rfl: 0 .  amoxicillin (AMOXIL) 500 MG capsule, Take 1 capsule (500 mg total) by mouth 3 (three) times daily., Disp: 30 capsule, Rfl: 0  Continue all other maintenance medications as listed above.  Follow up plan: Follow-up as needed or worsening of symptoms. Call office for any issues.   Educational handout given for tick bite  Terald Sleeper PA-C [place] [address]  [place],  [address] [phone]   01/23/2017, 2:12 PM

## 2017-01-23 NOTE — Patient Instructions (Signed)
Tick Bite Information Introduction Ticks are insects that attach themselves to the skin. There are many types of ticks. Common types include wood ticks and deer ticks. Sometimes, ticks carry diseases that can make a person very ill. The most common places for ticks to attach themselves are the scalp, neck, armpits, waist, and groin. HOW CAN YOU PREVENT TICK BITES? Take these steps to help prevent tick bites when you are outdoors:  Wear long sleeves and long pants.  Wear white clothes so you can see ticks more easily.  Tuck your pant legs into your socks.  If walking on a trail, stay in the middle of the trail to avoid brushing against bushes.  Avoid walking through areas with long grass.  Put bug spray on all skin that is showing and along boot tops, pant legs, and sleeve cuffs.  Check clothes, hair, and skin often and before going inside.  Brush off any ticks that are not attached.  Take a shower or bath as soon as possible after being outdoors.  HOW SHOULD YOU REMOVE A TICK? Ticks should be removed as soon as possible to help prevent diseases. 1. If latex gloves are available, put them on before trying to remove a tick. 2. Use tweezers to grasp the tick as close to the skin as possible. You may also use curved forceps or a tick removal tool. Grasp the tick as close to its head as possible. Avoid grasping the tick on its body. 3. Pull gently upward until the tick lets go. Do not twist the tick or jerk it suddenly. This may break off the tick's head or mouth parts. 4. Do not squeeze or crush the tick's body. This could force disease-carrying fluids from the tick into your body. 5. After the tick is removed, wash the bite area and your hands with soap and water or alcohol. 6. Apply a small amount of antiseptic cream or ointment to the bite site. 7. Wash any tools that were used.  Do not try to remove a tick by applying a hot match, petroleum jelly, or fingernail polish to the tick.  These methods do not work. They may also increase the chances of disease being spread from the tick bite. WHEN SHOULD YOU SEEK HELP? Contact your health care provider if you are unable to remove a tick or if a part of the tick breaks off in the skin. After a tick bite, you need to watch for signs and symptoms of diseases that can be spread by ticks. Contact your health care provider if you develop any of the following:  Fever.  Rash.  Redness and puffiness (swelling) in the area of the tick bite.  Tender, puffy lymph glands.  Watery poop (diarrhea).  Weight loss.  Cough.  Feeling more tired than normal (fatigue).  Muscle, joint, or bone pain.  Belly (abdominal) pain.  Headache.  Change in your level of consciousness.  Trouble walking or moving your legs.  Loss of feeling (numbness) in the legs.  Loss of movement (paralysis).  Shortness of breath.  Confusion.  Throwing up (vomiting) many times.  This information is not intended to replace advice given to you by your health care provider. Make sure you discuss any questions you have with your health care provider. Document Released: 10/15/2009 Document Revised: 12/27/2015 Document Reviewed: 12/29/2012 Elsevier Interactive Patient Education  2018 Elsevier Inc.  

## 2017-01-28 ENCOUNTER — Encounter: Payer: Self-pay | Admitting: Physical Therapy

## 2017-01-28 ENCOUNTER — Ambulatory Visit: Payer: Medicare Other | Admitting: Physical Therapy

## 2017-01-28 DIAGNOSIS — M25561 Pain in right knee: Secondary | ICD-10-CM | POA: Diagnosis not present

## 2017-01-28 DIAGNOSIS — M545 Low back pain: Principal | ICD-10-CM

## 2017-01-28 DIAGNOSIS — M25562 Pain in left knee: Secondary | ICD-10-CM

## 2017-01-28 DIAGNOSIS — G8929 Other chronic pain: Secondary | ICD-10-CM | POA: Diagnosis not present

## 2017-01-28 NOTE — Therapy (Signed)
[facility] [place], [place], [address] Phone: [phone]   Fax:  [phone]  Physical Therapy Treatment  Patient Details  Name: Krystal [name] MRN: [id number] Date of Birth: 1941/06/13 Referring Provider: Suella Broad, MD  Encounter Date: 01/28/2017      PT End of Session - 01/28/17 1256    Visit Number 15   Number of Visits 24   Date for PT Re-Evaluation 02/17/17   PT Start Time 1230   PT Stop Time 1309   PT Time Calculation (min) 39 min   Activity Tolerance Patient tolerated treatment well;Patient limited by fatigue   Behavior During Therapy [facility] for tasks assessed/performed      Past Medical History:  Diagnosis Date  . Anemia   . Asthma   . Brain tumor ([place])    1986  . Breast cancer ([place])    Right breast  . Cataract   . Colon polyps   . COPD (chronic obstructive pulmonary disease) ([place])   . Hiatal hernia   . Hyperlipidemia   . Leg edema   . Macular degeneration   . Osteoporosis   . PVD (posterior vitreous detachment)   . Right knee pain   . Shingles rash     Past Surgical History:  Procedure Laterality Date  . ABDOMINAL HYSTERECTOMY    . COLONOSCOPY    . CRANIECTOMY / CRANIOTOMY FOR EXCISION OF BRAIN TUMOR     Left side brain  . EYE SURGERY    . MASTECTOMY Right   . PARTIAL HYSTERECTOMY    . POLYPECTOMY    . SKIN CANCER EXCISION     Under left eye    There were no vitals filed for this visit.      Subjective Assessment - 01/28/17 1234    Subjective Patient reported not feeling well today and had a rough night for unknown reason   Pertinent History OP.   Patient Stated Goals Be able to walk a mile for exercise again.   Currently in Pain? Yes   Pain Score 1    Pain Location Knee   Pain Orientation Right   Pain Descriptors / Indicators Aching   Pain Type Chronic pain   Pain Onset More than a month ago   Pain Frequency Constant   Aggravating Factors  promlong activity   Pain  Relieving Factors rest                         [facility] Adult PT Treatment/Exercise - 01/28/17 0001      Knee/Hip Exercises: Aerobic   Nustep L6 x 76mn UE/LE, monitored for progression     Moist Heat Therapy   Number Minutes Moist Heat 15 Minutes   Moist Heat Location Knee     Electrical Stimulation   Electrical Stimulation Location R knee  IFC 80-'150hz'$  x 15 mins   Electrical Stimulation Goals Pain                  PT Short Term Goals - 10/16/16 1535      PT SHORT TERM GOAL #1   Title STG's=LTG's.           PT Long Term Goals - 12/17/16 1321      PT LONG TERM GOAL #1   Title Independent with a HEP.   Time 8   Period Weeks   Status Achieved     PT LONG TERM GOAL #2   Title Walk a  mile for exercise with pain not > 3/10.   Time 8   Period Weeks   Status Partially Met  only walked a few blocks distance thus far yet is able to at 3/10     PT LONG TERM GOAL #3   Title Perform ADL's with pain not > 3/10.   Time 8   Period Weeks   Status Achieved               Plan - 01/28/17 1257    Clinical Impression Statement Patient requested to end treatment early today due to fatigue. Patient just not feeling well. Patient able to complete nustep today yet increased her fatigue. Goals ongoing due to strength deficts.    Rehab Potential Good   PT Frequency 2x / week   PT Duration 8 weeks   PT Treatment/Interventions ADLs/Self Care Home Management;Electrical Stimulation;Moist Heat;Ultrasound;Gait training;Functional mobility training;Patient/family education;Neuromuscular re-education;Therapeutic exercise;Therapeutic activities;Manual techniques;Passive range of motion;Vasopneumatic Device   PT Next Visit Plan cont with POC for Pain-free bilateral quadriceps/hip and core strengthening, standing exercises as tolerated    Consulted and Agree with Plan of Care Patient      Patient will benefit from skilled therapeutic intervention in order to  improve the following deficits and impairments:  Pain, Decreased activity tolerance, Decreased strength, Decreased range of motion, Decreased mobility  Visit Diagnosis: Chronic midline low back pain, with sciatica presence unspecified  Chronic pain of left knee  Chronic pain of right knee     Problem List Patient Active Problem List   Diagnosis Date Noted  . Influenza with pneumonia 10/30/2015  . CAP (community acquired pneumonia) 10/29/2015  . Abdominal aortic atherosclerosis ([place]) 04/10/2015  . PSVT (paroxysmal supraventricular tachycardia) ([place]) 05/10/2014  . Macular degeneration, age related, nonexudative 08/24/2013  . History of right mastectomy 07/13/2013  . Anemia, iron deficiency 06/21/2013  . Allergic rhinitis 06/21/2013  . Chronic low back pain 06/21/2013  . Malignant neoplasm of female breast ([place]) 11/22/2007  . Mixed hyperlipidemia 11/22/2007  . Anxiety state 11/22/2007  . DEPRESSION 11/22/2007  . Asthma 11/22/2007  . GERD 11/22/2007  . GASTRITIS, WITH HEMORRHAGE 08/14/2007  . DUODENITIS, WITHOUT HEMORRHAGE 08/14/2007  . HIATAL HERNIA 08/14/2007  . 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    Harvest Deist P, PTA 01/28/2017, 1:13 PM  [facility] [place], [place], [address] Phone: [phone]   Fax:  [phone]  Name: Krystal [name] MRN: [id number] Date of Birth: 1941/02/12

## 2017-02-05 ENCOUNTER — Encounter: Payer: Self-pay | Admitting: Physical Therapy

## 2017-02-05 ENCOUNTER — Ambulatory Visit: Payer: Medicare Other | Attending: Physical Medicine and Rehabilitation | Admitting: Physical Therapy

## 2017-02-05 DIAGNOSIS — M25561 Pain in right knee: Secondary | ICD-10-CM | POA: Insufficient documentation

## 2017-02-05 DIAGNOSIS — G8929 Other chronic pain: Secondary | ICD-10-CM | POA: Diagnosis not present

## 2017-02-05 DIAGNOSIS — M545 Low back pain: Secondary | ICD-10-CM | POA: Diagnosis not present

## 2017-02-05 DIAGNOSIS — M25562 Pain in left knee: Secondary | ICD-10-CM | POA: Diagnosis not present

## 2017-02-05 NOTE — Therapy (Signed)
[facility] [place], [place], [address] Phone: [phone]   Fax:  [phone]  Physical Therapy Treatment  Patient Details  Name: Krystal [name] MRN: [id number] Date of Birth: Feb 18, 1941 Referring Provider: Suella Broad, MD  Encounter Date: 02/05/2017      PT End of Session - 02/05/17 1341    Visit Number 16   Number of Visits 24   Date for PT Re-Evaluation 02/17/17   PT Start Time 1300   PT Stop Time 1356   PT Time Calculation (min) 56 min   Activity Tolerance Patient tolerated treatment well   Behavior During Therapy [facility] for tasks assessed/performed      Past Medical History:  Diagnosis Date  . Anemia   . Asthma   . Brain tumor ([place])    1986  . Breast cancer ([place])    Right breast  . Cataract   . Colon polyps   . COPD (chronic obstructive pulmonary disease) ([place])   . Hiatal hernia   . Hyperlipidemia   . Leg edema   . Macular degeneration   . Osteoporosis   . PVD (posterior vitreous detachment)   . Right knee pain   . Shingles rash     Past Surgical History:  Procedure Laterality Date  . ABDOMINAL HYSTERECTOMY    . COLONOSCOPY    . CRANIECTOMY / CRANIOTOMY FOR EXCISION OF BRAIN TUMOR     Left side brain  . EYE SURGERY    . MASTECTOMY Right   . PARTIAL HYSTERECTOMY    . POLYPECTOMY    . SKIN CANCER EXCISION     Under left eye    There were no vitals filed for this visit.      Subjective Assessment - 02/05/17 1302    Subjective Patient reported feeling better today and unknown reason for fatigue last visit, some increased pain in right knee due to bending to pick green beans   Pertinent History OP.   Patient Stated Goals Be able to walk a mile for exercise again.   Currently in Pain? Yes   Pain Score 3    Pain Location Knee   Pain Orientation Right   Pain Descriptors / Indicators Aching   Pain Type Chronic pain   Pain Onset More than a month ago   Pain Frequency Constant   Aggravating Factors  prolong activity   Pain Relieving Factors at rest   Pain Score 1   Pain Location Back   Pain Orientation Lower   Pain Descriptors / Indicators Discomfort   Pain Type Chronic pain   Pain Onset More than a month ago   Pain Frequency Intermittent   Aggravating Factors  increased activity   Pain Relieving Factors at rest                         [facility] Adult PT Treatment/Exercise - 02/05/17 0001      Lumbar Exercises: Machines for Strengthening   Cybex Lumbar Extension 10# 3x15   Cybex Knee Extension 30# 3x15     Knee/Hip Exercises: Aerobic   Nustep L6 x 33mn UE/LE, monitored for progression     Knee/Hip Exercises: Standing   Terminal Knee Extension Limitations x30 with pink XTS   Hip Abduction Stengthening;Right;20 reps  red t-band     Knee/Hip Exercises: Supine   Bridges with BCardinal HealthStrengthening;Both;15 reps   Straight Leg Raise with External Rotation Strengthening;Both;2 sets;10 reps     Modalities  Modalities Vasopneumatic     Acupuncturist Location R knee  IFC 80-'150hz'$  x 15 mins   Electrical Stimulation Goals Pain     Vasopneumatic   Number Minutes Vasopneumatic  15 minutes   Vasopnuematic Location  Knee   Vasopneumatic Pressure Low                  PT Short Term Goals - 10/16/16 1535      PT SHORT TERM GOAL #1   Title STG's=LTG's.           PT Long Term Goals - 12/17/16 1321      PT LONG TERM GOAL #1   Title Independent with a HEP.   Time 8   Period Weeks   Status Achieved     PT LONG TERM GOAL #2   Title Walk a mile for exercise with pain not > 3/10.   Time 8   Period Weeks   Status Partially Met  only walked a few blocks distance thus far yet is able to at 3/10     PT LONG TERM GOAL #3   Title Perform ADL's with pain not > 3/10.   Time 8   Period Weeks   Status Achieved               Plan - 02/05/17 1342    Clinical Impression Statement  Patient tolerated treatment well today. Patient still limited with prolong activity due to pain kn right knee. Patient able to complete exercises today with no increased pain. Patient able to perform ADL's with greater ease if light activity, any outside or moderate activity will cause her increased discomfort. Goals ongoing at this time due to pain deficts.    Rehab Potential Good   PT Frequency 2x / week   PT Duration 8 weeks   PT Treatment/Interventions ADLs/Self Care Home Management;Electrical Stimulation;Moist Heat;Ultrasound;Gait training;Functional mobility training;Patient/family education;Neuromuscular re-education;Therapeutic exercise;Therapeutic activities;Manual techniques;Passive range of motion;Vasopneumatic Device   PT Next Visit Plan cont with POC for Pain-free bilateral quadriceps/hip and core strengthening, standing exercises as tolerated    Consulted and Agree with Plan of Care Patient      Patient will benefit from skilled therapeutic intervention in order to improve the following deficits and impairments:  Pain, Decreased activity tolerance, Decreased strength, Decreased range of motion, Decreased mobility  Visit Diagnosis: Chronic midline low back pain, with sciatica presence unspecified  Chronic pain of left knee  Chronic pain of right knee     Problem List Patient Active Problem List   Diagnosis Date Noted  . Influenza with pneumonia 10/30/2015  . CAP (community acquired pneumonia) 10/29/2015  . Abdominal aortic atherosclerosis ([place]) 04/10/2015  . PSVT (paroxysmal supraventricular tachycardia) ([place]) 05/10/2014  . Macular degeneration, age related, nonexudative 08/24/2013  . History of right mastectomy 07/13/2013  . Anemia, iron deficiency 06/21/2013  . Allergic rhinitis 06/21/2013  . Chronic low back pain 06/21/2013  . Malignant neoplasm of female breast ([place]) 11/22/2007  . Mixed hyperlipidemia 11/22/2007  . Anxiety state 11/22/2007  . DEPRESSION  11/22/2007  . Asthma 11/22/2007  . GERD 11/22/2007  . GASTRITIS, WITH HEMORRHAGE 08/14/2007  . DUODENITIS, WITHOUT HEMORRHAGE 08/14/2007  . HIATAL HERNIA 08/14/2007  . 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    Danetta Prom P, PTA 02/05/2017, 1:56 PM  [facility] [address] [place], [place], [address] Phone: [phone]   Fax:  [phone]  Name: Krystal [name] MRN: [id number] Date of Birth: [birth year]

## 2017-02-09 ENCOUNTER — Encounter: Payer: Self-pay | Admitting: Gastroenterology

## 2017-02-09 ENCOUNTER — Ambulatory Visit (INDEPENDENT_AMBULATORY_CARE_PROVIDER_SITE_OTHER): Payer: Medicare Other | Admitting: Gastroenterology

## 2017-02-09 VITALS — BP 116/60 | HR 84 | Ht 63.39 in | Wt 167.5 lb

## 2017-02-09 DIAGNOSIS — Z86010 Personal history of colonic polyps: Secondary | ICD-10-CM

## 2017-02-09 DIAGNOSIS — K5900 Constipation, unspecified: Secondary | ICD-10-CM

## 2017-02-09 MED ORDER — NA SULFATE-K SULFATE-MG SULF 17.5-3.13-1.6 GM/177ML PO SOLN: 1.0000 | Freq: Once | ORAL | 0 refills | Status: AC

## 2017-02-09 NOTE — Progress Notes (Signed)
Review of pertinent gastrointestinal problems: 1. Personal history of adenomatous colon polyps: Colonoscopy July 2008 Dr. Ardis Hughs for routine screening. A single subcentimeter adenoma was removed from an otherwise normal colon. Repeat colonoscopy July 2013 Dr. Ardis Hughs showed 2 subcentimeter polyps, both were removed, one was retrieved, this was proven to be adenomatous as well. She was recommended for repeat colonoscopy at five-year interval.   HPI: This is a  very pleasant 76 year old woman whom I last saw about 5 years ago time of a colonoscopy.  Chief complaint is personal history of colon polyps  Chronic constipation, she'll use her bowels once a week. Often has to push and strain. This has been her pattern for 30 or 40 years.  Thinks her mom had colon cancer, died at 55years old; really not sure if she had colon cancer.  She tends to be constipated.  Moves her bowels one per week.  She's tried fibers supplements, a very long time ago.  Doesn't think they helped and they did give her some bloating and gas.  Wearing a Scooby Do hat today.    She gets around fine, takes care of her house.  Shops.  ROS: complete GI ROS as described in HPI, all other review negative.  Constitutional:  No unintentional weight loss   Past Medical History:  Diagnosis Date  . Anemia   . Asthma   . Brain tumor ([place])    1986  . Breast cancer ([place])    Right breast  . Cataract   . Colon polyps   . COPD (chronic obstructive pulmonary disease) ([place])   . Hiatal hernia   . Hyperlipidemia   . Leg edema   . Macular degeneration   . Osteoporosis   . Pneumonia   . PVD (posterior vitreous detachment)   . Right knee pain   . Shingles rash     Past Surgical History:  Procedure Laterality Date  . CATARACT EXTRACTION, BILATERAL    . COLONOSCOPY    . CRANIECTOMY / CRANIOTOMY FOR EXCISION OF BRAIN TUMOR     Left side brain  . MASTECTOMY Right   . PARTIAL HYSTERECTOMY    . POLYPECTOMY    . SKIN CANCER  EXCISION Left    Under left eye    Current Outpatient Prescriptions  Medication Sig Dispense Refill  . aspirin EC 81 MG tablet Take 81 mg by mouth at bedtime.    . Calcium Citrate-Vitamin D (CALCIUM CITRATE + D) 315-250 MG-UNIT TABS Take 2 tablets by mouth daily. 120 tablet   . cetirizine (ZYRTEC) 10 MG tablet Take 5-10 mg by mouth at bedtime.     . Cholecalciferol (VITAMIN D) 2000 UNITS CAPS Take 1 capsule by mouth at bedtime.    . diazepam (VALIUM) 5 MG tablet Take 1 tablet (5 mg total) by mouth every 6 (six) hours as needed. 30 tablet 5  . diltiazem (CARDIZEM CD) 240 MG 24 hr capsule TAKE ONE CAPSULE BY MOUTH ONE TIME DAILY 90 capsule 3  . esomeprazole (NEXIUM) 40 MG capsule TAKE 1 CAPSULE (40 MG TOTAL) BY MOUTH DAILY. 90 capsule 0  . ferrous sulfate 325 (65 FE) MG tablet Take 325 mg by mouth at bedtime.    . montelukast (SINGULAIR) 10 MG tablet TAKE 1 TABLET (10 MG TOTAL) BY MOUTH AT BEDTIME. 90 tablet 0  . Multiple Vitamins-Minerals (PRESERVISION/LUTEIN PO) Take 1 capsule by mouth at bedtime.     Marland Kitchen PROAIR HFA 108 (90 Base) MCG/ACT inhaler INHALE TWO PUFFS BY MOUTH 4  TIMES DAILY AS NEEDED 9 g 2  . simvastatin (ZOCOR) 10 MG tablet TAKE ONE TABLET BY MOUTH ONE TIME DAILY 90 tablet 0  . nitroGLYCERIN (NITROLINGUAL) 0.4 MG/SPRAY spray Use one spray under tongue as needed for chest pain. May repeat dose in 5 minutes. If chest pain not relieved than call 911. (Patient not taking: Reported on 02/09/2017) 12 g 1   No current facility-administered medications for this visit.     Allergies as of 02/09/2017 - Review Complete 02/09/2017  Allergen Reaction Noted  . Actonel [risedronate] Nausea And Vomiting and Other (See Comments) 11/04/2012  . Fosamax [alendronate sodium] Other (See Comments) 11/04/2012  . Mevacor [lovastatin] Other (See Comments) 11/04/2012  . Miacalcin [calcitonin (salmon)] Nausea And Vomiting 11/04/2012  . Mobic [meloxicam] Hives and Itching 07/17/2014    Family History   Problem Relation Age of Onset  . Heart disease Mother 50       Stroke   . Dementia Mother   . Congestive Heart Failure Mother   . Osteoporosis Mother   . Heart disease Sister   . Hypertension Sister   . Diabetes Sister   . Osteoporosis Sister   . Heart disease Brother   . Hyperlipidemia Brother   . Diabetes Brother   . Heart disease Sister   . Hypertension Sister   . Diabetes Sister   . Diabetes Sister   . Hypertension Sister   . Diabetes Sister   . Hypertension Sister   . Diabetes Brother   . Hyperlipidemia Brother   . Cancer Father        lung  . Heart attack Father   . Diabetes Brother   . Heart disease Brother   . Hyperlipidemia Brother   . Diabetes Brother   . Diabetes Son   . Colon cancer Neg Hx   . Rectal cancer Neg Hx   . Stomach cancer Neg Hx   . Esophageal cancer Neg Hx     Social History   Social History  . Marital status: Widowed    Spouse name: N/A  . Number of children: 1  . Years of education: N/A   Occupational History  . retired    Social History Main Topics  . Smoking status: Former Smoker    Types: Cigarettes    Quit date: 02/09/1961  . Smokeless tobacco: Never Used  . Alcohol use No  . Drug use: No  . Sexual activity: No   Other Topics Concern  . Not on file   Social History Narrative  . No narrative on file     Physical Exam: Ht 5' 3.39" (1.61 m) Comment: height measured without shoes  Wt 167 lb 8 oz (76 kg)   BMI 29.31 kg/m  Constitutional: generally well-appearing Psychiatric: alert and oriented x3 Abdomen: soft, nontender, nondistended, no obvious ascites, no peritoneal signs, normal bowel sounds No peripheral edema noted in lower extremities  Assessment and plan: 76 y.o. female with Personal history of adenomatous colon polyps, chronic very long-term constipation  First I recommended she try fiber supplements again on a daily basis. Second she has a personal history of precancerous colon polyps and she is interested  in repeat colonoscopy at this time. She does understand that colon cancer screening generally stops between ages 42 and 36 for most people. She is in good health and I think polyp surveillance is still a reasonable clinical issue for her. We will therefore go ahead with repeat colonoscopy.  Please see the "Patient Instructions" section for  addition details about the plan.  Owens Loffler, MD [facility] 02/09/2017, 1:23 PM

## 2017-02-09 NOTE — Patient Instructions (Addendum)
Please start taking citrucel (orange flavored) powder fiber supplement.  This may cause some bloating at first but that usually goes away. Begin with a small spoonful and work your way up to a large, heaping spoonful daily over a week.  Stay hydrated, this will help keep your bowels more regular.  You will be set up for a colonoscopy for polyp surveillance.

## 2017-02-11 ENCOUNTER — Ambulatory Visit: Payer: Medicare Other | Admitting: Physical Therapy

## 2017-02-11 ENCOUNTER — Encounter: Payer: Self-pay | Admitting: Physical Therapy

## 2017-02-11 DIAGNOSIS — M545 Low back pain: Principal | ICD-10-CM

## 2017-02-11 DIAGNOSIS — M25561 Pain in right knee: Secondary | ICD-10-CM | POA: Diagnosis not present

## 2017-02-11 DIAGNOSIS — M25562 Pain in left knee: Secondary | ICD-10-CM

## 2017-02-11 DIAGNOSIS — G8929 Other chronic pain: Secondary | ICD-10-CM | POA: Diagnosis not present

## 2017-02-11 NOTE — Therapy (Addendum)
[facility], [place], [address] Phone: [phone]   Fax:  [phone]  Physical Therapy Treatment  Patient Details  Name: Krystal [name] MRN: [id number] Date of Birth: [birth year] Referring Provider: Suella Broad, MD  Encounter Date: 02/11/2017      PT End of Session - 02/11/17 1310    Visit Number 17   Number of Visits 24   Date for PT Re-Evaluation 02/17/17   PT Start Time 1230   PT Stop Time 1326   PT Time Calculation (min) 56 min   Activity Tolerance Patient tolerated treatment well   Behavior During Therapy [facility] for tasks assessed/performed      Past Medical History:  Diagnosis Date  . Anemia   . Asthma   . Brain tumor ([place])    1986  . Breast cancer ([place])    Right breast  . Cataract   . Colon polyps   . COPD (chronic obstructive pulmonary disease) ([place])   . Hiatal hernia   . Hyperlipidemia   . Leg edema   . Macular degeneration   . Osteoporosis   . Pneumonia   . PVD (posterior vitreous detachment)   . Right knee pain   . Shingles rash     Past Surgical History:  Procedure Laterality Date  . CATARACT EXTRACTION, BILATERAL    . COLONOSCOPY    . CRANIECTOMY / CRANIOTOMY FOR EXCISION OF BRAIN TUMOR     Left side brain  . MASTECTOMY Right   . PARTIAL HYSTERECTOMY    . POLYPECTOMY    . SKIN CANCER EXCISION Left    Under left eye    There were no vitals filed for this visit.      Subjective Assessment - 02/11/17 1245    Subjective Patient doing well today with some fatigue after picking beans all weekend.   Pertinent History OP.   Patient Stated Goals Be able to walk a mile for exercise again.   Currently in Pain? Yes   Pain Score 3    Pain Location Knee   Pain Orientation Right   Pain Descriptors / Indicators Aching   Pain Type Chronic pain   Pain Onset More than a month ago   Pain Frequency Constant   Aggravating Factors  prolong activity   Pain Relieving Factors at rest                          [facility] Adult PT Treatment/Exercise - 02/11/17 0001      Lumbar Exercises: Machines for Strengthening   Cybex Lumbar Extension 10# 3x15   Cybex Knee Extension 30# 3x15     Knee/Hip Exercises: Aerobic   Nustep L6 x 76mn UE/LE, monitored for progression     Knee/Hip Exercises: Standing   Terminal Knee Extension Limitations x30 with pink XTS   Hip Abduction Stengthening;Right;20 reps  red t-band     Knee/Hip Exercises: Supine   Bridges with BCardinal HealthStrengthening;Both;20 reps   Straight Leg Raise with External Rotation Strengthening;Both;2 sets;10 reps     EAcupuncturistLocation R knee  IFC 80-_0  x 15 mins   Electrical Stimulation Goals Pain     Vasopneumatic   Number Minutes Vasopneumatic  15 minutes   Vasopnuematic Location  Knee   Vasopneumatic Pressure Low                  PT Short Term Goals - 10/16/16 1535  PT SHORT TERM GOAL #1   Title STG's=LTG's.           PT Long Term Goals - 12/17/16 1321      PT LONG TERM GOAL #1   Title Independent with a HEP.   Time 8   Period Weeks   Status Achieved     PT LONG TERM GOAL #2   Title Walk a mile for exercise with pain not > 3/10.   Time 8   Period Weeks   Status Partially Met  only walked a few blocks distance thus far yet is able to at 3/10     PT LONG TERM GOAL #3   Title Perform ADL's with pain not > 3/10.   Time 8   Period Weeks   Status Achieved               Plan - 02/11/17 1311    Clinical Impression Statement Patient tolerated treatment well today. Patient continues to have limitations with prolong walking due to right knee pain. Patient able to complete all exercises with no increased pain. Patient able to perform ADL's with greater ease and able to work out in garden picking beans with little increased soreness. Patient would like to DC next visit to her HEP. Patient goals ongoing due to pain  limitations.    Rehab Potential Good   PT Frequency 2x / week   PT Duration 8 weeks   PT Treatment/Interventions ADLs/Self Care Home Management;Electrical Stimulation;Moist Heat;Ultrasound;Gait training;Functional mobility training;Patient/family education;Neuromuscular re-education;Therapeutic exercise;Therapeutic activities;Manual techniques;Passive range of motion;Vasopneumatic Device   PT Next Visit Plan review HEP and DC   Consulted and Agree with Plan of Care Patient      Patient will benefit from skilled therapeutic intervention in order to improve the following deficits and impairments:  Pain, Decreased activity tolerance, Decreased strength, Decreased range of motion, Decreased mobility  Visit Diagnosis: Chronic midline low back pain, with sciatica presence unspecified  Chronic pain of left knee  Chronic pain of right knee     Problem List Patient Active Problem List   Diagnosis Date Noted  . Influenza with pneumonia 10/30/2015  . CAP (community acquired pneumonia) 10/29/2015  . Abdominal aortic atherosclerosis ([place]) 04/10/2015  . PSVT (paroxysmal supraventricular tachycardia) ([place]) 05/10/2014  . Macular degeneration, age related, nonexudative 08/24/2013  . History of right mastectomy 07/13/2013  . Anemia, iron deficiency 06/21/2013  . Allergic rhinitis 06/21/2013  . Chronic low back pain 06/21/2013  . Malignant neoplasm of female breast ([place]) 11/22/2007  . Mixed hyperlipidemia 11/22/2007  . Anxiety state 11/22/2007  . DEPRESSION 11/22/2007  . Asthma 11/22/2007  . GERD 11/22/2007  . GASTRITIS, WITH HEMORRHAGE 08/14/2007  . DUODENITIS, WITHOUT HEMORRHAGE 08/14/2007  . HIATAL HERNIA 08/14/2007  . 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    Toleen Lachapelle P, PTA 02/11/2017, 1:35 PM  [facility] [address] [place], [place], [address] Phone: [phone]   Fax:  [phone]  Name: Krystal [name] MRN:  [id number] Date of Birth: 14-Jul-1941  PHYSICAL THERAPY DISCHARGE SUMMARY  Visits from Start of Care: 17.  Current functional level related to goals / functional outcomes: See above.    Remaining deficits: See goal section.   Education / Equipment: HEP. Plan: Patient agrees to discharge.  Patient goals were partially met. Patient is being discharged due to being pleased with the current functional level.  ?????        Mali Applegate MPT

## 2017-02-19 ENCOUNTER — Encounter: Payer: Self-pay | Admitting: Physical Therapy

## 2017-03-01 ENCOUNTER — Other Ambulatory Visit: Payer: Self-pay | Admitting: Family Medicine

## 2017-03-03 ENCOUNTER — Encounter: Payer: Self-pay | Admitting: Neurology

## 2017-03-10 ENCOUNTER — Ambulatory Visit (INDEPENDENT_AMBULATORY_CARE_PROVIDER_SITE_OTHER): Payer: Medicare Other | Admitting: Neurology

## 2017-03-10 DIAGNOSIS — M5412 Radiculopathy, cervical region: Secondary | ICD-10-CM

## 2017-03-10 DIAGNOSIS — R200 Anesthesia of skin: Secondary | ICD-10-CM

## 2017-03-10 NOTE — Procedures (Signed)
[facility] Neurology  [place], [place]  [place], [place] [address] Tel: [phone] Fax:  [phone] Test Date:  03/10/2017  Patient: Krystal [name] DOB: 1941-07-30 Physician: Narda Amber, DO  Sex: Female Height: 5\' 3"  Ref Phys: Chipper Herb, MD  ID#: [id number]   Technician:    Patient Complaints: This is a 76 year-old female referred for evaluation of 2-year history of right hand paresthesias.  NCV & EMG Findings: Extensive electrodiagnostic testing of the right upper extremity and additional studies of the left shows:  1. Right median and ulnar sensory responses show asymmetrically reduced amplitude (2.9, 2.0 V), and right radial sensory response is low normal.  Left median and ulnar sensory responses are within normal limits. 2. Bilateral median and left ulnar motor responses are within normal limits. Right ulnar motor response shows reduced amplitude (6.0 mV) with normal latency and conduction velocity. 3. Chronic motor axon loss changes are seen diffusely involving C6-C8 myotomes on the right, without accompanied active denervation. These findings are not present in the left upper extremity.  Impression: 1. 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 2. 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  In the absence of demyelinating features, a polyradiculoneuropathy is thought to be less likely. Neurological consultation for further evaluation is recommended.   ___________________________ Narda Amber, DO    Nerve Conduction Studies Anti Sensory Summary Table   Site NR Peak (ms) Norm Peak (ms) P-T Amp (V) Norm P-T Amp  Left Median Anti Sensory (2nd Digit)  Wrist    2.9 <3.8 21.9 >10  Right Median Anti Sensory (2nd Digit)  Wrist    3.5 <3.8 2.9 >10  Right Radial Anti Sensory (Base  1st Digit)  Wrist    1.8 <2.8 10.7 >10  Left Ulnar Anti Sensory (5th Digit)  Wrist    2.8 <3.2 20.4 >5  Right Ulnar Anti Sensory (5th Digit)  Wrist    3.0 <3.2 2.0 >5   Motor Summary Table   Site NR Onset (ms) Norm Onset (ms) O-P Amp (mV) Norm O-P Amp Site1 Site2 Delta-0 (ms) Dist (cm) Vel (m/s) Norm Vel (m/s)  Left Median Motor (Abd Poll Brev)  Wrist    2.6 <4.0 8.2 >5 Elbow Wrist 4.7 28.0 60 >50  Elbow    7.3  8.1         Right Median Motor (Abd Poll Brev)  Wrist    2.9 <4.0 6.8 >5 Elbow Wrist 5.0 28.0 56 >50  Elbow    7.9  6.4         Left Ulnar Motor (Abd Dig Minimi)  Wrist    2.5 <3.1 7.2 >7 B Elbow Wrist 3.6 22.0 61 >50  B Elbow    6.1  5.9  A Elbow B Elbow 1.8 10.0 56 >50  A Elbow    7.9  5.4         Right Ulnar Motor (Abd Dig Minimi)  Wrist    2.5 <3.1 6.0 >7 B Elbow Wrist 3.6 21.5 60 >50  B Elbow    6.1  5.4  A Elbow B Elbow 1.8 10.0 56 >50  A Elbow    7.9  5.0         Right Ulnar (FDI) Motor (1st DI)  Wrist    3.8 <4.5 5.7 >7         EMG  Side Muscle Ins Act Fibs Psw Fasc Number Recrt Dur Dur. Amp Amp. Poly Poly. Comment  Right 1stDorInt Nml Nml Nml Nml 1- Rapid Some 1+ Some 1+ Nml Nml N/A  Right Triceps Nml Nml Nml Nml 2- Rapid Some 1+ Some 1+ Nml Nml N/A  Right PronatorTeres Nml Nml Nml Nml 1- Rapid Some 1+ Some 1+ Nml Nml N/A  Right Ext Indicis Nml Nml Nml Nml 1- Rapid Some 1+ Some 1+ Nml Nml N/A  Right Biceps Nml Nml Nml Nml 1- Rapid Some 1+ Some 1+ Nml Nml N/A  Right Abd Poll Brev Nml Nml Nml Nml Nml Nml Nml Nml Nml Nml Nml Nml N/A  Right Deltoid Nml Nml Nml Nml Nml Nml Nml Nml Nml Nml Nml Nml N/A  Left 1stDorInt Nml Nml Nml Nml Nml Nml Nml Nml Nml Nml Nml Nml N/A  Left Ext Indicis Nml Nml Nml Nml Nml Nml Nml Nml Nml Nml Nml Nml N/A  Left PronatorTeres Nml Nml Nml Nml Nml Nml Nml Nml Nml Nml Nml Nml N/A  Left Biceps Nml Nml Nml Nml Nml Nml Nml Nml Nml Nml Nml Nml N/A      Waveforms:

## 2017-03-16 ENCOUNTER — Telehealth: Payer: Self-pay | Admitting: Family Medicine

## 2017-03-17 ENCOUNTER — Telehealth: Payer: Self-pay | Admitting: *Deleted

## 2017-03-17 NOTE — Telephone Encounter (Signed)
Request sent to Georgina Pillion, LPN to put referral in epic.

## 2017-03-17 NOTE — Telephone Encounter (Signed)
Please get a copy of this report for me to review so that we can call the patient

## 2017-03-17 NOTE — Telephone Encounter (Signed)
-----   Message from Alda Berthold, DO sent at 03/17/2017  8:30 AM EDT ----- Please have them place an Epic referral for neurology to see Dr. Posey Pronto for neuropathy.  Thanks  ----- Message ----- From: Mehkai Gallo, Aileen Pilot, LPN Sent: 3/40/3524   8:28 AM To: Alda Berthold, DO    ----- Message ----- From: Zannie Cove, LPN Sent: 03/21/5908   4:52 PM To: Aileen Pilot Niyla Marone, LPN  Everlena Cooper  - in the notes from the DO for this pt's visit - it states that she needs to see her for follow up --- is this something you guy do in house - or do you need a referral??  ----- Message ----- From: Chipper Herb, MD Sent: 03/11/2017   7:51 AM To: Zannie Cove, LPN  Please get a follow-up appointment for this patient with the neurologist for further evaluation

## 2017-03-17 NOTE — Telephone Encounter (Signed)
Copy requested

## 2017-03-18 ENCOUNTER — Other Ambulatory Visit: Payer: Self-pay | Admitting: *Deleted

## 2017-03-18 ENCOUNTER — Telehealth: Payer: Self-pay | Admitting: Family Medicine

## 2017-03-18 DIAGNOSIS — R200 Anesthesia of skin: Secondary | ICD-10-CM

## 2017-03-18 NOTE — Telephone Encounter (Signed)
Pt notified of results and referral 

## 2017-03-18 NOTE — Telephone Encounter (Signed)
Pt aware that PNCV test was done and it was more extensive than they thought -- she is aware that Neurology referral was placed and their office will be calling her with an appt.

## 2017-03-19 ENCOUNTER — Telehealth: Payer: Self-pay | Admitting: Family Medicine

## 2017-03-30 NOTE — Telephone Encounter (Signed)
Patient spoke with someone 08/15 and 08/16 please see those telephone encounters

## 2017-03-31 ENCOUNTER — Other Ambulatory Visit: Payer: Self-pay | Admitting: Family Medicine

## 2017-04-01 NOTE — Telephone Encounter (Signed)
lmtcb rcvd [facility] from [facility] Is pt having sxs for refill of abx

## 2017-04-03 ENCOUNTER — Telehealth: Payer: Self-pay | Admitting: Family Medicine

## 2017-04-03 MED ORDER — IPRATROPIUM-ALBUTEROL 0.5-2.5 (3) MG/3ML IN SOLN: 3.0000 mL | Freq: Four times a day (QID) | RESPIRATORY_TRACT | 2 refills | Status: DC | PRN

## 2017-04-03 NOTE — Telephone Encounter (Signed)
LMOVM that neb solution sent to pharmacy

## 2017-04-07 ENCOUNTER — Encounter: Payer: Self-pay | Admitting: Gastroenterology

## 2017-04-07 ENCOUNTER — Telehealth: Payer: Self-pay

## 2017-04-07 ENCOUNTER — Ambulatory Visit (AMBULATORY_SURGERY_CENTER): Payer: Medicare Other | Admitting: Gastroenterology

## 2017-04-07 ENCOUNTER — Telehealth: Payer: Self-pay | Admitting: *Deleted

## 2017-04-07 VITALS — BP 101/54 | HR 76 | Temp 98.0°F | Resp 14 | Ht 63.0 in | Wt 167.0 lb

## 2017-04-07 DIAGNOSIS — K635 Polyp of colon: Secondary | ICD-10-CM

## 2017-04-07 DIAGNOSIS — D124 Benign neoplasm of descending colon: Secondary | ICD-10-CM | POA: Diagnosis not present

## 2017-04-07 DIAGNOSIS — D120 Benign neoplasm of cecum: Secondary | ICD-10-CM | POA: Diagnosis not present

## 2017-04-07 DIAGNOSIS — D126 Benign neoplasm of colon, unspecified: Secondary | ICD-10-CM

## 2017-04-07 DIAGNOSIS — Z86010 Personal history of colonic polyps: Secondary | ICD-10-CM | POA: Diagnosis not present

## 2017-04-07 MED ORDER — SODIUM CHLORIDE 0.9 % IV SOLN: 500.0000 mL | INTRAVENOUS | Status: DC

## 2017-04-07 NOTE — Op Note (Signed)
[facility] Patient Name: Krystal [name] Procedure Date: 04/07/2017 1:56 PM MRN: [id number] Endoscopist: Milus Banister , MD Age: 76 Referring MD:  Date of Birth: [birth year] Gender: Female Account #: [id number] Procedure:                Colonoscopy Indications:              High risk colon cancer surveillance: Personal                            history of colonic polyps; Personal history of                            adenomatous colon polyps:Colonoscopy July 2008DrArdis Hughs for routine screening. A single                            subcentimeter adenoma was removed from an otherwise                            normal colon. Repeat colonoscopy July 2013DrArdis Hughs showed 2 subcentimeter polyps, both were                            removed, one was retrieved, this was proven to be                            adenomatous as well. She was recommended for repeat                            colonoscopy at five-year interval. Medicines:                Monitored Anesthesia Care Procedure:                Pre-Anesthesia Assessment:                           - Prior to the procedure, a History and Physical                            was performed, and patient medications and                            allergies were reviewed. The patient's tolerance of                            previous anesthesia was also reviewed. The risks                            and benefits of the procedure and the sedation  options and risks were discussed with the patient.                            All questions were answered, and informed consent                            was obtained. Prior Anticoagulants: The patient has                            taken no previous anticoagulant or antiplatelet                            agents. ASA Grade Assessment: II - A patient with                            mild systemic disease.  After reviewing the risks                            and benefits, the patient was deemed in                            satisfactory condition to undergo the procedure.                           After obtaining informed consent, the colonoscope                            was passed under direct vision. Throughout the                            procedure, the patient's blood pressure, pulse, and                            oxygen saturations were monitored continuously. The                            Colonoscope was introduced through the anus and                            advanced to the the cecum, identified by                            appendiceal orifice and ileocecal valve. The                            colonoscopy was performed without difficulty. The                            patient tolerated the procedure well. The quality                            of the bowel preparation was adequate. The  ileocecal valve, appendiceal orifice, and rectum                            were photographed. Scope In: 2:16:24 PM Scope Out: 2:28:55 PM Scope Withdrawal Time: 0 hours 9 minutes 48 seconds  Total Procedure Duration: 0 hours 12 minutes 31 seconds  Findings:                 Four sessile polyps were found in the descending                            colon and cecum. The polyps were 3 to 7 mm in size.                            These polyps were removed with a cold snare.                            Resection and retrieval were complete.                           The exam was otherwise without abnormality on                            direct and retroflexion views. Complications:            No immediate complications. Estimated blood loss:                            None. Estimated Blood Loss:     Estimated blood loss: none. Impression:               - Four 3 to 7 mm polyps in the descending colon and                            in the cecum, removed with a cold  snare. Resected                            and retrieved.                           - The examination was otherwise normal on direct                            and retroflexion views. Recommendation:           - Patient has a contact number available for                            emergencies. The signs and symptoms of potential                            delayed complications were discussed with the                            patient. Return to normal activities tomorrow.  Written discharge instructions were provided to the                            patient.                           - Resume previous diet.                           - Continue present medications.                           You will receive a letter within 2-3 weeks with the                            pathology results and my final recommendations.                           If the polyp(s) is proven to be 'pre-cancerous' on                            pathology, you will need repeat colonoscopy in 3-5                            years. If the polyp(s) is NOT 'precancerous' on                            pathology then you should repeat colon cancer                            screening in 10 years with colonoscopy without need                            for colon cancer screening by any method prior to                            then (including stool testing). Milus Banister, MD 04/07/2017 2:32:00 PM This report has been signed electronically.

## 2017-04-07 NOTE — Patient Instructions (Signed)
   Information on polyps given to you today   YOU HAD AN ENDOSCOPIC PROCEDURE TODAY AT [facility]:   Refer to the procedure report that was given to you for any specific questions about what was found during the examination.  If the procedure report does not answer your questions, please call your gastroenterologist to clarify.  If you requested that your care partner not be given the details of your procedure findings, then the procedure report has been included in a sealed envelope for you to review at your convenience later.  YOU SHOULD EXPECT: Some feelings of bloating in the abdomen. Passage of more gas than usual.  Walking can help get rid of the air that was put into your GI tract during the procedure and reduce the bloating. If you had a lower endoscopy (such as a colonoscopy or flexible sigmoidoscopy) you may notice spotting of blood in your stool or on the toilet paper. If you underwent a bowel prep for your procedure, you may not have a normal bowel movement for a few days.  Please Note:  You might notice some irritation and congestion in your nose or some drainage.  This is from the oxygen used during your procedure.  There is no need for concern and it should clear up in a day or so.  SYMPTOMS TO REPORT IMMEDIATELY:   Following lower endoscopy (colonoscopy or flexible sigmoidoscopy):  Excessive amounts of blood in the stool  Significant tenderness or worsening of abdominal pains  Swelling of the abdomen that is new, acute  Fever of 100F or higher    For urgent or emergent issues, a gastroenterologist can be reached at any hour by calling [phone].   DIET:  We do recommend a small meal at first, but then you may proceed to your regular diet.  Drink plenty of fluids but you should avoid alcoholic beverages for 24 hours.  ACTIVITY:  You should plan to take it easy for the rest of today and you should NOT DRIVE or use heavy machinery until tomorrow (because  of the sedation medicines used during the test).    FOLLOW UP: Our staff will call the number listed on your records the next business day following your procedure to check on you and address any questions or concerns that you may have regarding the information given to you following your procedure. If we do not reach you, we will leave a message.  However, if you are feeling well and you are not experiencing any problems, there is no need to return our call.  We will assume that you have returned to your regular daily activities without incident.  If any biopsies were taken you will be contacted by phone or by letter within the next 1-3 weeks.  Please call us at [phone] if you have not heard about the biopsies in 3 weeks.    SIGNATURES/CONFIDENTIALITY: You and/or your care partner have signed paperwork which will be entered into your electronic medical record.  These signatures attest to the fact that that the information above on your After Visit Summary has been reviewed and is understood.  Full responsibility of the confidentiality of this discharge information lies with you and/or your care-partner.

## 2017-04-07 NOTE — Telephone Encounter (Signed)
Pt left message on VM this morning that she needed her nebulizer medication. LMOVM that this was sent in. [facility] as well this had to be put through insurance the way the neb machine is covered by insurance Instructed pt to contact [facility]

## 2017-04-07 NOTE — Telephone Encounter (Signed)
x

## 2017-04-07 NOTE — Progress Notes (Signed)
Spontaneous respirations throughout. VSS. Resting comfortably. To PACU on room air. Report to  RN. 

## 2017-04-07 NOTE — Progress Notes (Signed)
Called to room to assist during endoscopic procedure.  Patient ID and intended procedure confirmed with present staff. Received instructions for my participation in the procedure from the performing physician.  

## 2017-04-08 ENCOUNTER — Telehealth: Payer: Self-pay | Admitting: *Deleted

## 2017-04-08 ENCOUNTER — Telehealth: Payer: Self-pay | Admitting: Family Medicine

## 2017-04-08 MED ORDER — ALBUTEROL SULFATE (2.5 MG/3ML) 0.083% IN NEBU: 2.5000 mg | INHALATION_SOLUTION | Freq: Four times a day (QID) | RESPIRATORY_TRACT | 1 refills | Status: DC | PRN

## 2017-04-08 NOTE — Telephone Encounter (Signed)
Message left

## 2017-04-08 NOTE — Telephone Encounter (Signed)
Anything that would be cheaper than DuoNEb?? Dr D - coverage for [facility]

## 2017-04-08 NOTE — Telephone Encounter (Signed)
[facility]ol [facility]izer [facility] Warrick Parisian, tried to contact patient and inform, no answer.

## 2017-04-08 NOTE — Telephone Encounter (Signed)
Send her albuterol nebules, if not I do have samples in office

## 2017-04-12 ENCOUNTER — Encounter: Payer: Self-pay | Admitting: Gastroenterology

## 2017-04-13 DIAGNOSIS — H353212 Exudative age-related macular degeneration, right eye, with inactive choroidal neovascularization: Secondary | ICD-10-CM | POA: Diagnosis not present

## 2017-04-16 ENCOUNTER — Telehealth: Payer: Self-pay | Admitting: Family Medicine

## 2017-04-17 ENCOUNTER — Ambulatory Visit (INDEPENDENT_AMBULATORY_CARE_PROVIDER_SITE_OTHER): Payer: Medicare Other | Admitting: Pediatrics

## 2017-04-17 ENCOUNTER — Encounter: Payer: Self-pay | Admitting: Pediatrics

## 2017-04-17 VITALS — BP 123/69 | HR 77 | Temp 96.4°F | Ht 63.0 in | Wt 167.4 lb

## 2017-04-17 DIAGNOSIS — S80861A Insect bite (nonvenomous), right lower leg, initial encounter: Secondary | ICD-10-CM

## 2017-04-17 DIAGNOSIS — W57XXXA Bitten or stung by nonvenomous insect and other nonvenomous arthropods, initial encounter: Secondary | ICD-10-CM

## 2017-04-17 MED ORDER — DOXYCYCLINE HYCLATE 100 MG PO TABS: 100.0000 mg | ORAL_TABLET | Freq: Two times a day (BID) | ORAL | 0 refills | Status: DC

## 2017-04-17 NOTE — Telephone Encounter (Signed)
Will be working on it

## 2017-04-17 NOTE — Telephone Encounter (Signed)
[facility] and she needs prior authorization for [employer]

## 2017-04-17 NOTE — Progress Notes (Signed)
  Subjective:   Patient ID: Krystal [name], female    DOB: [birth year], 76 y.o.   MRN: [id number] CC: Insect Bite (Left leg)  HPI: Krystal [name] is a 76 y.o. female presenting for Insect Bite (Left leg)  Noticed red area on leg yesterday during shower, was dime sized then Grew today to a couple of cm No tenderness Didn't see anything bite her, thinks there might have been something but her eye sight is poor No swelling in leg No fevers, aches, joint pains that are new Appetite has been normal  Relevant past medical, surgical, family and social history reviewed. Allergies and medications reviewed and updated. History  Smoking Status  . Former Smoker  . Types: Cigarettes  . Quit date: 02/09/1961  Smokeless Tobacco  . Never Used   ROS: Per HPI   Objective:    BP 123/69   Pulse 77   Temp (!) 96.4 F (35.8 C) (Oral)   Ht 5\' 3"  (1.6 m)   Wt 167 lb 6.4 oz (75.9 kg)   BMI 29.65 kg/m   Wt Readings from Last 3 Encounters:  04/17/17 167 lb 6.4 oz (75.9 kg)  04/07/17 167 lb (75.8 kg)  02/09/17 167 lb 8 oz (76 kg)    Gen: NAD, alert, cooperative with exam, NCAT EYES: EOMI, no conjunctival injection, or no icterus CV: NRRR, normal S1/S2, no murmur, distal pulses 2+ b/l Resp: CTABL, no wheezes, normal WOB Abd: +BS, soft, NTND. no guarding or organomegaly Ext: No pitting edema, warm Neuro: Alert and oriented Skin: R ant shin with apprx 4cm by 2cm well demarcated irregular border variably red patch with central 45mm red scab No induration, no tenderness, no central clearing Slight yellowing of skin around bite consistent with bruising  Assessment & Plan:  Krystal [name] was seen today for insect bite.  Diagnoses and all orders for this visit:  Insect bite, initial encounter Pt very worried about return for eval and to drug store if worsening given upcoming storm Does not appear to be consistent with EM No systemic symptoms Not sore or tender Will give Rx for doxycycline,  start if area becomes more tender Call if any questions about whether or not to start abx  Other orders -     doxycycline (VIBRA-TABS) 100 MG tablet; Take 1 tablet (100 mg total) by mouth 2 (two) times daily.   Follow up plan: Return if symptoms worsen or fail to improve. Assunta Found, MD [place]

## 2017-04-24 ENCOUNTER — Encounter: Payer: Self-pay | Admitting: *Deleted

## 2017-04-27 ENCOUNTER — Other Ambulatory Visit: Payer: Self-pay

## 2017-04-27 ENCOUNTER — Encounter: Payer: Self-pay | Admitting: *Deleted

## 2017-04-27 MED ORDER — ALBUTEROL SULFATE (2.5 MG/3ML) 0.083% IN NEBU: 2.5000 mg | INHALATION_SOLUTION | Freq: Four times a day (QID) | RESPIRATORY_TRACT | 1 refills | Status: DC | PRN

## 2017-05-25 ENCOUNTER — Encounter: Payer: Self-pay | Admitting: Family Medicine

## 2017-05-25 ENCOUNTER — Ambulatory Visit (INDEPENDENT_AMBULATORY_CARE_PROVIDER_SITE_OTHER): Payer: Medicare Other | Admitting: Family Medicine

## 2017-05-25 VITALS — BP 113/65 | HR 76 | Temp 97.3°F | Ht 63.0 in | Wt 167.0 lb

## 2017-05-25 DIAGNOSIS — K5901 Slow transit constipation: Secondary | ICD-10-CM | POA: Diagnosis not present

## 2017-05-25 DIAGNOSIS — E559 Vitamin D deficiency, unspecified: Secondary | ICD-10-CM | POA: Diagnosis not present

## 2017-05-25 DIAGNOSIS — M810 Age-related osteoporosis without current pathological fracture: Secondary | ICD-10-CM

## 2017-05-25 DIAGNOSIS — M47816 Spondylosis without myelopathy or radiculopathy, lumbar region: Secondary | ICD-10-CM | POA: Diagnosis not present

## 2017-05-25 DIAGNOSIS — I10 Essential (primary) hypertension: Secondary | ICD-10-CM

## 2017-05-25 DIAGNOSIS — I700 Atherosclerosis of aorta: Secondary | ICD-10-CM

## 2017-05-25 DIAGNOSIS — D509 Iron deficiency anemia, unspecified: Secondary | ICD-10-CM

## 2017-05-25 DIAGNOSIS — I471 Supraventricular tachycardia: Secondary | ICD-10-CM | POA: Diagnosis not present

## 2017-05-25 DIAGNOSIS — E7800 Pure hypercholesterolemia, unspecified: Secondary | ICD-10-CM

## 2017-05-25 NOTE — Progress Notes (Signed)
Subjective:    Patient ID: Krystal [name], female    DOB: 1941-02-08, 76 y.o.   MRN: [id number]  HPI Pt here for follow up and management of chronic medical problems which includes hypertension and hyperlipidemia. She is taking medication regularly.  The patient is doing well overall.  She does complain of some ongoing constipation issues and a lesion on her left face.  She is due to return in FOBT today and will get lab work today.  She refuses to take a flu shot.  She also refuses to get mammograms and Pap smears.  The patient did see the gastroenterologist and was scheduled for colonoscopy for polyp surveillance.  He also addressed her constipation by encouraging her to take Citrucel daily.  Colonoscopy was done and she did have polyps on the colonoscopy.  Patient denies any chest pain.  She does have occasional pressure and has an supposed to be seeing the cardiologist yearly and has not seen him this year.  She says it is too hard for driving away from the town and she would like to see the one that comes locally.  We will arrange for this.  She denies any trouble with swallowing heartburn indigestion nausea vomiting diarrhea or blood in the stool.  She does have a constipation and did not tolerate the Citrucel.  This caused too much gas and bloating.  She is passing her water without problems.  She still has her ongoing back issues from osteoarthritis.    Patient Active Problem List   Diagnosis Date Noted  . Influenza with pneumonia 10/30/2015  . CAP (community acquired pneumonia) 10/29/2015  . Abdominal aortic atherosclerosis ([place]) 04/10/2015  . PSVT (paroxysmal supraventricular tachycardia) ([place]) 05/10/2014  . Macular degeneration, age related, nonexudative 08/24/2013  . History of right mastectomy 07/13/2013  . Anemia, iron deficiency 06/21/2013  . Allergic rhinitis 06/21/2013  . Chronic low back pain 06/21/2013  . Malignant neoplasm of female breast ([place]) 11/22/2007  . Mixed  hyperlipidemia 11/22/2007  . Anxiety state 11/22/2007  . DEPRESSION 11/22/2007  . Asthma 11/22/2007  . GERD 11/22/2007  . GASTRITIS, WITH HEMORRHAGE 08/14/2007  . DUODENITIS, WITHOUT HEMORRHAGE 08/14/2007  . HIATAL HERNIA 08/14/2007  . 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   Outpatient Encounter Prescriptions as of 05/25/2017  Medication Sig  . albuterol (PROVENTIL) (2.5 MG/3ML) 0.083% nebulizer solution Take 3 mLs (2.5 mg total) by nebulization every 6 (six) hours as needed for wheezing or shortness of breath. Dx:  B35.329  . aspirin EC 81 MG tablet Take 81 mg by mouth at bedtime.  . Calcium Citrate-Vitamin D (CALCIUM CITRATE + D) 315-250 MG-UNIT TABS Take 2 tablets by mouth daily.  . cetirizine (ZYRTEC) 10 MG tablet Take 5-10 mg by mouth at bedtime.   . Cholecalciferol (VITAMIN D) 2000 UNITS CAPS Take 1 capsule by mouth at bedtime.  . diazepam (VALIUM) 5 MG tablet Take 1 tablet (5 mg total) by mouth every 6 (six) hours as needed.  . diltiazem (CARDIZEM CD) 240 MG 24 hr capsule TAKE ONE CAPSULE BY MOUTH ONE TIME DAILY  . esomeprazole (NEXIUM) 40 MG capsule TAKE 1 CAPSULE (40 MG TOTAL) BY MOUTH DAILY.  . ferrous sulfate 325 (65 FE) MG tablet Take 325 mg by mouth at bedtime.  Marland Kitchen ipratropium-albuterol (DUONEB) 0.5-2.5 (3) MG/3ML SOLN Take 3 mLs by nebulization every 6 (six) hours as needed. Dx:  Asthma 493.90  . montelukast (SINGULAIR) 10 MG tablet TAKE 1 TABLET (10 MG TOTAL) BY MOUTH AT BEDTIME.  Marland Kitchen  Multiple Vitamins-Minerals (PRESERVISION/LUTEIN PO) Take 1 capsule by mouth at bedtime.   . nitroGLYCERIN (NITROLINGUAL) 0.4 MG/SPRAY spray Use one spray under tongue as needed for chest pain. May repeat dose in 5 minutes. If chest pain not relieved than call 911.  Marland Kitchen PROAIR HFA 108 (90 Base) MCG/ACT inhaler INHALE TWO PUFFS BY MOUTH 4 TIMES DAILY AS NEEDED  . simvastatin (ZOCOR) 10 MG tablet TAKE ONE TABLET BY MOUTH ONE TIME DAILY  . [DISCONTINUED] doxycycline (VIBRA-TABS) 100 MG tablet Take 1  tablet (100 mg total) by mouth 2 (two) times daily.   Facility-Administered Encounter Medications as of 05/25/2017  Medication  . 0.9 %  sodium chloride infusion      Review of Systems  Constitutional: Negative.   HENT: Negative.   Eyes: Negative.   Respiratory: Negative.   Cardiovascular: Negative.   Gastrointestinal: Positive for constipation (only going once a week - abd pain).  Endocrine: Negative.   Genitourinary: Negative.   Musculoskeletal: Negative.   Skin: Negative.        Left side of face - lesion   Allergic/Immunologic: Negative.   Neurological: Negative.   Hematological: Negative.   Psychiatric/Behavioral: Negative.        Objective:   Physical Exam  Constitutional: She is oriented to person, place, and time. She appears well-developed and well-nourished. No distress.  The patient is pleasant and alert but does have a kyphotic posture.  HENT:  Head: Normocephalic and atraumatic.  Right Ear: External ear normal.  Left Ear: External ear normal.  Nose: Nose normal.  Mouth/Throat: Oropharynx is clear and moist. No oropharyngeal exudate.  Eyes: Pupils are equal, round, and reactive to light. Conjunctivae and EOM are normal. Right eye exhibits no discharge. Left eye exhibits no discharge. No scleral icterus.  Neck: Normal range of motion. Neck supple. No thyromegaly present.  No adenopathy or thyromegaly or bruits.  Cardiovascular: Normal rate, regular rhythm, normal heart sounds and intact distal pulses.   No murmur heard. Heart is regular at 72/min  Pulmonary/Chest: Effort normal and breath sounds normal. No respiratory distress. She has no wheezes. She has no rales.  Clear anteriorly and posteriorly  Abdominal: Soft. Bowel sounds are normal. She exhibits no mass. There is tenderness. There is no rebound and no guarding.  Slight epigastric tenderness without liver or spleen enlargement masses or bruits and no inguinal adenopathy  Musculoskeletal: Normal range  of motion. She exhibits no edema or tenderness.  Kyphosis  Lymphadenopathy:    She has no cervical adenopathy.  Neurological: She is alert and oriented to person, place, and time. She has normal reflexes. No cranial nerve deficit.  Skin: Skin is warm and dry. No rash noted.  There is an area of seborrheic dermatitis on her left cheek and this is not improving any or gets worse we will do cryotherapy at the next visit.  Psychiatric: She has a normal mood and affect. Her behavior is normal. Judgment and thought content normal.  Nursing note and vitals reviewed.   BP 113/65 (BP Location: Left Arm)   Pulse 76   Temp (!) 97.3 F (36.3 C) (Oral)   Ht 5' 3"  (1.6 m)   Wt 167 lb (75.8 kg)   BMI 29.58 kg/m        Assessment & Plan:  1. Pure hypercholesterolemia -Continue with aggressive therapeutic lifestyle changes and current treatment pending results of lab work - CBC with Differential/Platelet - Lipid panel  2. Essential hypertension -Continue with current treatment pending results of  lab work - BMP8+EGFR - CBC with Differential/Platelet - Hepatic function panel  3. Vitamin D deficiency -Continue with current treatment pending results of lab work - CBC with Differential/Platelet - VITAMIN D 25 Hydroxy (Vit-D Deficiency, Fractures)  4. Iron deficiency anemia, unspecified iron deficiency anemia type -Continue with ferrous sulfate pending results of lab work - CBC with Differential/Platelet  5. Thoracic aortic atherosclerosis ([place]) -Continue with aggressive therapeutic lifestyle changes and simvastatin pending results of lab work - CBC with Differential/Platelet - Lipid panel  6. Abdominal aortic atherosclerosis ([place]) -Continue with statin therapy  7. Osteoporosis, unspecified osteoporosis type, unspecified pathological fracture presence -Continue with calcium and vitamin D replacement  8. Paroxysmal supraventricular tachycardia ([place]) -Schedule visit with cardiologist  closer by if possible  9. Spondylosis of lumbar region without myelopathy or radiculopathy -Take Tylenol as needed for pain and make every effort not to put yourself at risk for falling  10. Slow transit constipation -Try Linzess low strength 1 daily or every other day  No orders of the defined types were placed in this encounter.  Patient Instructions                       Medicare Annual Wellness Visit  [facility] and the medical providers at [facility] strive to bring you the best medical care.  In doing so we not only want to address your current medical conditions and concerns but also to detect new conditions early and prevent illness, disease and health-related problems.    Medicare offers a yearly Wellness Visit which allows our clinical staff to assess your need for preventative services including immunizations, lifestyle education, counseling to decrease risk of preventable diseases and screening for fall risk and other medical concerns.    This visit is provided free of charge (no copay) for all Medicare recipients. The clinical pharmacists at [facility] have begun to conduct these Wellness Visits which will also include a thorough review of all your medications.    As you primary medical provider recommend that you make an appointment for your Annual Wellness Visit if you have not done so already this year.  You may set up this appointment before you leave today or you may call back ([phone]) and schedule an appointment.  Please make sure when you call that you mention that you are scheduling your Annual Wellness Visit with the clinical pharmacist so that the appointment may be made for the proper length of time.     Continue current medications. Continue good therapeutic lifestyle changes which include good diet and exercise. Fall precautions discussed with patient. If an FOBT was given today- please return it to our front  desk. If you are over 2 years old - you may need Prevnar 33 or the adult Pneumonia vaccine.  **Flu shots are available--- please call and schedule a FLU-CLINIC appointment**  After your visit with [place] today you will receive a survey in the mail or online from [employer] regarding your care with [place]. Please take a moment to fill this out. Your feedback is very important to [place] as you can help [place] better understand your patient needs as well as improve your experience and satisfaction. WE CARE ABOUT YOU!!!     Arrie Senate MD

## 2017-05-25 NOTE — Patient Instructions (Addendum)
Medicare Annual Wellness Visit  [facility] and the medical providers at [facility] strive to bring you the best medical care.  In doing so we not only want to address your current medical conditions and concerns but also to detect new conditions early and prevent illness, disease and health-related problems.    Medicare offers a yearly Wellness Visit which allows our clinical staff to assess your need for preventative services including immunizations, lifestyle education, counseling to decrease risk of preventable diseases and screening for fall risk and other medical concerns.    This visit is provided free of charge (no copay) for all Medicare recipients. The clinical pharmacists at [facility] have begun to conduct these Wellness Visits which will also include a thorough review of all your medications.    As you primary medical provider recommend that you make an appointment for your Annual Wellness Visit if you have not done so already this year.  You may set up this appointment before you leave today or you may call back ([phone]) and schedule an appointment.  Please make sure when you call that you mention that you are scheduling your Annual Wellness Visit with the clinical pharmacist so that the appointment may be made for the proper length of time.     Continue current medications. Continue good therapeutic lifestyle changes which include good diet and exercise. Fall precautions discussed with patient. If an FOBT was given today- please return it to our front desk. If you are over 52 years old - you may need Prevnar 24 or the adult Pneumonia vaccine.  **Flu shots are available--- please call and schedule a FLU-CLINIC appointment**  After your visit with [place] today you will receive a survey in the mail or online from [employer] regarding your care with [place]. Please take a moment to fill this out. Your feedback is very  important to [place] as you can help [place] better understand your patient needs as well as improve your experience and satisfaction. WE CARE ABOUT YOU!!!   Try medication for constipation as directed and call us back after trying this to make sure that it was working well. Return the FOBT If the area of skin on the face is not go away we will do cryotherapy at the next visit.

## 2017-05-25 NOTE — Addendum Note (Signed)
Addended by: Zannie Cove on: 05/25/2017 11:54 AM   Modules accepted: Orders

## 2017-05-26 LAB — CBC WITH DIFFERENTIAL/PLATELET
BASOS ABS: 0 10*3/uL (ref 0.0–0.2)
Basos: 1 %
EOS (ABSOLUTE): 0.2 10*3/uL (ref 0.0–0.4)
Eos: 5 %
Hematocrit: 38.6 % (ref 34.0–46.6)
Hemoglobin: 13.3 g/dL (ref 11.1–15.9)
IMMATURE GRANS (ABS): 0 10*3/uL (ref 0.0–0.1)
IMMATURE GRANULOCYTES: 0 %
LYMPHS: 28 %
Lymphocytes Absolute: 1.5 10*3/uL (ref 0.7–3.1)
MCH: 30.4 pg (ref 26.6–33.0)
MCHC: 34.5 g/dL (ref 31.5–35.7)
MCV: 88 fL (ref 79–97)
Monocytes Absolute: 0.4 10*3/uL (ref 0.1–0.9)
Monocytes: 8 %
NEUTROS PCT: 58 %
Neutrophils Absolute: 3.1 10*3/uL (ref 1.4–7.0)
PLATELETS: 173 10*3/uL (ref 150–379)
RBC: 4.37 x10E6/uL (ref 3.77–5.28)
RDW: 13.8 % (ref 12.3–15.4)
WBC: 5.2 10*3/uL (ref 3.4–10.8)

## 2017-05-26 LAB — HEPATIC FUNCTION PANEL
ALK PHOS: 42 IU/L (ref 39–117)
ALT: 9 IU/L (ref 0–32)
AST: 11 IU/L (ref 0–40)
Albumin: 4.1 g/dL (ref 3.5–4.8)
BILIRUBIN, DIRECT: 0.14 mg/dL (ref 0.00–0.40)
Bilirubin Total: 0.5 mg/dL (ref 0.0–1.2)
Total Protein: 6 g/dL (ref 6.0–8.5)

## 2017-05-26 LAB — LIPID PANEL
CHOLESTEROL TOTAL: 157 mg/dL (ref 100–199)
Chol/HDL Ratio: 2.6 ratio (ref 0.0–4.4)
HDL: 61 mg/dL (ref >39)
LDL Calculated: 78 mg/dL (ref 0–99)
Triglycerides: 89 mg/dL (ref 0–149)
VLDL CHOLESTEROL CAL: 18 mg/dL (ref 5–40)

## 2017-05-26 LAB — BMP8+EGFR
BUN/Creatinine Ratio: 26 (ref 12–28)
BUN: 20 mg/dL (ref 8–27)
CALCIUM: 9.4 mg/dL (ref 8.7–10.3)
CHLORIDE: 103 mmol/L (ref 96–106)
CO2: 25 mmol/L (ref 20–29)
Creatinine, Ser: 0.76 mg/dL (ref 0.57–1.00)
GFR calc Af Amer: 87 mL/min/{1.73_m2} (ref >59)
GFR, EST NON AFRICAN AMERICAN: 75 mL/min/{1.73_m2} (ref >59)
Glucose: 87 mg/dL (ref 65–99)
POTASSIUM: 4.1 mmol/L (ref 3.5–5.2)
Sodium: 142 mmol/L (ref 134–144)

## 2017-05-26 LAB — VITAMIN D 25 HYDROXY (VIT D DEFICIENCY, FRACTURES): VIT D 25 HYDROXY: 42.5 ng/mL (ref 30.0–100.0)

## 2017-05-28 ENCOUNTER — Telehealth: Payer: Self-pay | Admitting: Family Medicine

## 2017-05-28 ENCOUNTER — Encounter (INDEPENDENT_AMBULATORY_CARE_PROVIDER_SITE_OTHER): Payer: Medicare Other

## 2017-05-28 DIAGNOSIS — Z1231 Encounter for screening mammogram for malignant neoplasm of breast: Secondary | ICD-10-CM | POA: Diagnosis not present

## 2017-05-28 NOTE — Telephone Encounter (Signed)
Please advise 

## 2017-05-29 ENCOUNTER — Other Ambulatory Visit: Payer: Self-pay | Admitting: Family Medicine

## 2017-05-29 NOTE — Telephone Encounter (Signed)
Advised patient that we normally work a month ahead and that I will start on December's shots in November. I will call her back to schedule.

## 2017-05-31 ENCOUNTER — Other Ambulatory Visit: Payer: Self-pay | Admitting: Cardiology

## 2017-06-01 NOTE — Telephone Encounter (Signed)
Phoned in.

## 2017-06-01 NOTE — Telephone Encounter (Signed)
Please call in diazepam with   1 refills

## 2017-06-09 ENCOUNTER — Telehealth: Payer: Self-pay | Admitting: Cardiology

## 2017-06-09 NOTE — Telephone Encounter (Signed)
Yes, that is fine with me if it is more convenient for her. I last saw her in June 2017 for follow-up of stable PSVT on medical therapy.

## 2017-06-09 NOTE — Telephone Encounter (Signed)
New message    Pt is calling stating that she would like to switch from Dr. Domenic Polite in [place] to Dr. Percival Spanish in [place]. She states it is closer for her. Is this switch ok?

## 2017-06-09 NOTE — Telephone Encounter (Signed)
Yes

## 2017-06-22 ENCOUNTER — Other Ambulatory Visit (INDEPENDENT_AMBULATORY_CARE_PROVIDER_SITE_OTHER): Payer: Medicare Other

## 2017-06-22 ENCOUNTER — Ambulatory Visit (INDEPENDENT_AMBULATORY_CARE_PROVIDER_SITE_OTHER): Payer: Medicare Other | Admitting: Neurology

## 2017-06-22 ENCOUNTER — Encounter: Payer: Self-pay | Admitting: Neurology

## 2017-06-22 VITALS — BP 120/70 | HR 91 | Ht 63.0 in | Wt 165.4 lb

## 2017-06-22 DIAGNOSIS — R200 Anesthesia of skin: Secondary | ICD-10-CM

## 2017-06-22 DIAGNOSIS — M5412 Radiculopathy, cervical region: Secondary | ICD-10-CM

## 2017-06-22 LAB — FOLATE

## 2017-06-22 LAB — SEDIMENTATION RATE: Sed Rate: 17 mm/hr (ref 0–30)

## 2017-06-22 LAB — C-REACTIVE PROTEIN: CRP: 0.2 mg/dL — ABNORMAL LOW (ref 0.5–20.0)

## 2017-06-22 LAB — TSH: TSH: 2.15 u[IU]/mL (ref 0.35–4.50)

## 2017-06-22 NOTE — Patient Instructions (Signed)
MRI cervical spine wwo contrast Check labs

## 2017-06-22 NOTE — Progress Notes (Signed)
[facility] Clinic Note - Initial Visit   Date: 06/22/17  Krystal [name] MRN: [id number] DOB: 09/24/1940   Dear Dr. Laurance Flatten:  Thank you for your kind referral of Krystal [name] for consultation of right hand weakness. Although her history is well known to you, please allow [place] to reiterate it for the purpose of our medical record. The patient was accompanied to the clinic by self.   History of Present Illness: Krystal [name] is a 76 y.o. left-handed Caucasian female with GERD, hyperlipidemia, COPD, right breast cancer s/p mastectomy and radiation (01/1978), brain benign brain tumor and SVTpresenting for evaluation of right hand pain and paresthesias.    Starting around 2017, she began having painful tingling of the right hand involving both side, from the wrist to the tips of her finger.  Symptoms are constant and worse at night and prevents her from sleeping. She has increased weakness and often drops objects.  She has not noticed any alleviating or exacerbating factors.  She denies neck pain.  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 She denies any similar symptoms of the left hand or feet. She had no relief with gabapentin.   Out-side paper records, electronic medical record, and images have been reviewed where available and summarized as:  Lab Results  Component Value Date   VITAMINB12 277 01/19/2017   Lab Results  Component Value Date   TSH 2.250 10/18/2014   MRI brain 09/12/2016: 1. Stable compared to 2015. 2. L4-5 facet right more than left arthropathy with mild anterolisthesis. 3. L5-S1 degenerative disc narrowing and mild bilateral foraminal narrowing. 4. No compressive stenosis throughout the lumbar spine.  NCS/EMG of the right arm 03/10/2017: 1. 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 2. 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  In the absence of demyelinating features, a polyradiculoneuropathy is thought to be less likely. Neurological consultation for further evaluation is recommended.  Past Medical History:  Diagnosis Date  . Anemia   . Asthma   . Brain tumor ([place])    1986  . Breast cancer ([place])    Right breast  . Cataract   . Colon polyps   . COPD (chronic obstructive pulmonary disease) ([place])   . Hiatal hernia   . Hyperlipidemia   . Leg edema   . Macular degeneration   . Osteoporosis   . Pneumonia   . PVD (posterior vitreous detachment)   . Right knee pain   . Shingles rash     Past Surgical History:  Procedure Laterality Date  . CATARACT EXTRACTION, BILATERAL    . COLONOSCOPY    . CRANIECTOMY / CRANIOTOMY FOR EXCISION OF BRAIN TUMOR     Left side brain  . MASTECTOMY Right   . PARTIAL HYSTERECTOMY    . POLYPECTOMY    . SKIN CANCER EXCISION Left    Under left eye    Medications:  Outpatient Encounter Medications as of 06/22/2017  Medication Sig Note  . albuterol (PROVENTIL) (2.5 MG/3ML) 0.083% nebulizer solution Take 3 mLs (2.5 mg total) by nebulization every 6 (six) hours as needed for wheezing or shortness of breath. Dx:  Y77.412   . aspirin EC 81 MG tablet Take 81 mg by mouth at bedtime.   . Calcium Citrate-Vitamin  D (CALCIUM CITRATE + D) 315-250 MG-UNIT TABS Take 2 tablets by mouth daily.   . cetirizine (ZYRTEC) 10 MG tablet Take 5-10 mg by mouth at bedtime.    . Cholecalciferol (VITAMIN D) 2000 UNITS CAPS Take 1 capsule by mouth at bedtime.   . diazepam (VALIUM) 5 MG tablet TAKE 1 TABLET BY MOUTH EVERY 6 HOURS AS NEEDED   . diltiazem (CARDIZEM CD) 240 MG 24 hr capsule TAKE ONE CAPSULE BY MOUTH ONE TIME DAILY   . esomeprazole (NEXIUM) 40 MG capsule TAKE 1 CAPSULE (40 MG TOTAL) BY  MOUTH DAILY.   . ferrous sulfate 325 (65 FE) MG tablet Take 325 mg by mouth at bedtime.   Marland Kitchen ipratropium-albuterol (DUONEB) 0.5-2.5 (3) MG/3ML SOLN Take 3 mLs by nebulization every 6 (six) hours as needed. Dx:  Asthma 493.90   . montelukast (SINGULAIR) 10 MG tablet TAKE 1 TABLET (10 MG TOTAL) BY MOUTH AT BEDTIME.   . Multiple Vitamins-Minerals (PRESERVISION/LUTEIN PO) Take 1 capsule by mouth at bedtime.    . nitroGLYCERIN (NITROLINGUAL) 0.4 MG/SPRAY spray Use one spray under tongue as needed for chest pain. May repeat dose in 5 minutes. If chest pain not relieved than call 911. 02/09/2017: On hand  . PROAIR HFA 108 (90 Base) MCG/ACT inhaler INHALE TWO PUFFS BY MOUTH 4 TIMES DAILY AS NEEDED   . simvastatin (ZOCOR) 10 MG tablet TAKE ONE TABLET BY MOUTH ONE TIME DAILY    Facility-Administered Encounter Medications as of 06/22/2017  Medication  . 0.9 %  sodium chloride infusion     Allergies:  Allergies  Allergen Reactions  . Actonel [Risedronate] Nausea And Vomiting and Other (See Comments)    Throat tightness  . Fosamax [Alendronate Sodium] Other (See Comments)    esophagitis  . Mevacor [Lovastatin] Other (See Comments)    myalgias  . Miacalcin [Calcitonin ([place])] Nausea And Vomiting  . Mobic [Meloxicam] Hives and Itching    rash    Family History: Family History  Problem Relation Age of Onset  . Heart disease Mother 76       Stroke   . Dementia Mother   . Congestive Heart Failure Mother   . Osteoporosis Mother   . Heart disease Sister   . Hypertension Sister   . Diabetes Sister   . Osteoporosis Sister   . Heart disease Brother   . Hyperlipidemia Brother   . Diabetes Brother   . Heart disease Sister   . Hypertension Sister   . Diabetes Sister   . Diabetes Sister   . Hypertension Sister   . Diabetes Sister   . Hypertension Sister   . Diabetes Brother   . Hyperlipidemia Brother   . Cancer Father        lung  . Heart attack Father   . Diabetes Brother   . Heart  disease Brother   . Hyperlipidemia Brother   . Diabetes Brother   . Diabetes Son   . Colon cancer Neg Hx   . Rectal cancer Neg Hx   . Stomach cancer Neg Hx   . Esophageal cancer Neg Hx     Social History: Social History   Tobacco Use  . Smoking status: Former Smoker    Types: Cigarettes    Last attempt to quit: 02/09/1961    Years since quitting: 56.4  . Smokeless tobacco: Never Used  Substance Use Topics  . Alcohol use: No    Alcohol/week: 0.0 oz  . Drug use: No   Social History  Social History Narrative   Son lives with patient.   Retired from farming.   Highest level of education:  7th    Review of Systems:  CONSTITUTIONAL: No fevers, chills, night sweats, or weight loss.   EYES: No visual changes or eye pain ENT: No hearing changes.  No history of nose bleeds.   RESPIRATORY: No cough, wheezing and shortness of breath.   CARDIOVASCULAR: Negative for chest pain, and palpitations.   GI: Negative for abdominal discomfort, blood in stools or black stools.  No recent change in bowel habits.   GU:  No history of incontinence.   MUSCLOSKELETAL: No history of joint pain or swelling.  No myalgias.   SKIN: Negative for lesions, rash, and itching.   HEMATOLOGY/ONCOLOGY: Negative for prolonged bleeding, bruising easily, and swollen nodes.  +history of cancer.   ENDOCRINE: Negative for cold or heat intolerance, polydipsia or goiter.   PSYCH:  No depression or anxiety symptoms.   NEURO: As Above.   Vital Signs:  BP 120/70   Pulse 91   Ht _0  (1.6 m)   Wt 165 lb 6 oz (75 kg)   SpO2 96%   BMI 29.29 kg/m  Pain Scale: 0 on a scale of 0-10   General Medical Exam:   General:  Well appearing, comfortable.   Eyes/ENT: see cranial nerve examination.   Neck: No masses appreciated.  Full range of motion without tenderness.  No carotid bruits. Respiratory:  Clear to auscultation, good air entry bilaterally.   Cardiac:  Regular rate and rhythm, no murmur.   Extremities:  No  deformities, edema, or skin discoloration.  Skin:  No rashes or lesions.  Neurological Exam: MENTAL STATUS including orientation to time, place, person, recent and remote memory, attention span and concentration, language, and fund of knowledge is normal.  Speech is not dysarthric.  CRANIAL NERVES: II:  No visual field defects.  Yellow drusen over the macula bilaterally on fundoscopic exam.   III-IV-VI: Pupils equal round and reactive to light.  Normal conjugate, extra-ocular eye movements in all directions of gaze.  No nystagmus.  No ptosis.  V:  Normal facial sensation VII:  Normal facial symmetry and movements.   VIII:  Normal hearing and vestibular function.   IX-X:  Normal palatal movement.   XI:  Normal shoulder shrug and head rotation.   XII:  Normal tongue strength and range of motion, no deviation or fasciculation.  MOTOR:  R ADM and FDI atrophy.  No fasciculations or abnormal movements.  No pronator drift.  Tone is normal.    Right Upper Extremity:    Left Upper Extremity:    Deltoid  5/5   Deltoid  5/5   Biceps  5/5   Biceps  5/5   Triceps  5/5   Triceps  5/5   Wrist extensors  5/5   Wrist extensors  5/5   Wrist flexors  5/5   Wrist flexors  5/5   Finger extensors  5/5   Finger extensors  5/5   Finger flexors  5/5   Finger flexors  5/5   Dorsal interossei  4/5   Dorsal interossei  5/5   Abductor pollicis  5-/5   Abductor pollicis  5/5   Tone (Ashworth scale)  0  Tone (Ashworth scale)  0   Right Lower Extremity:    Left Lower Extremity:    Hip flexors  5/5   Hip flexors  5/5   Hip extensors  5/5   Hip  extensors  5/5   Knee flexors  5/5   Knee flexors  5/5   Knee extensors  5/5   Knee extensors  5/5   Dorsiflexors  5/5   Dorsiflexors  5/5   Plantarflexors  5/5   Plantarflexors  5/5   Toe extensors  5/5   Toe extensors  5/5   Toe flexors  5/5   Toe flexors  5/5   Tone (Ashworth scale)  0  Tone (Ashworth scale)  0   MSRs:  Right                                                                  Left brachioradialis 2+  brachioradialis 2+  biceps 2+  biceps 2+  triceps 2+  triceps 2+  patellar 2+  patellar 2+  ankle jerk 2+  ankle jerk 2+  Hoffman no  Hoffman no  plantar response down  plantar response down   SENSORY:  Pin prick, temperature, and light touch reduced over the palmer and dorsal surface of the right hand distal to wrist, vibration is reduced by 50% on the R MCP.  Sensation to all modalities is intact in the left hand and lower extremities.   COORDINATION/GAIT: Normal finger-to- nose-finger.  Intact rapid alternating movements bilaterally. Gait mildly wide-based, stable and unassisted.   IMPRESSION: Ms. Cordner is a 76 year-old female referred for evaluation of right hand painful paresthesias.  Her NCS/EMG of the right hand showed reduced median and ulnar sensory responses with normal motor responses.  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  Exam shows weakness and atrophy FDI and ADM on the right.  Recommend further evaluation with MRI cervical spine.  Because of the asymmetric sensory neuropathy, labs for secondary causes of neuropathy will be checked.  PLAN/RECOMMENDATIONS:  1.  MRI cervical spine wwo contrast 2.  Check MMA, folate, vitamin B1, copper, SPEP with IFE, ESR, CRP, TSH 3.  Neuralgesic medication declined.  No benefit with gabapentin in the past  Further recommendations will be based on her results   Thank you for allowing me to participate in patient's care.  If I can answer any additional questions, I would be pleased to do so.    Sincerely,    Dua Mehler K. Posey Pronto, DO

## 2017-06-27 LAB — METHYLMALONIC ACID, SERUM: METHYLMALONIC ACID, QUANT: 227 nmol/L (ref 87–318)

## 2017-06-27 LAB — IMMUNOFIXATION ELECTROPHORESIS
IGG (IMMUNOGLOBIN G), SERUM: 692 mg/dL — AB (ref 694–1618)
IGM, SERUM: 22 mg/dL — AB (ref 48–271)
Immunofix Electr Int: NOT DETECTED
Immunoglobulin A: 130 mg/dL (ref 81–463)

## 2017-06-27 LAB — COPPER, SERUM: Copper: 113 ug/dL (ref 70–175)

## 2017-06-27 LAB — PROTEIN ELECTROPHORESIS, SERUM
ALPHA 2: 1 g/dL — AB (ref 0.5–0.9)
Albumin ELP: 3.9 g/dL (ref 3.8–4.8)
Alpha 1: 0.3 g/dL (ref 0.2–0.3)
BETA 2: 0.3 g/dL (ref 0.2–0.5)
BETA GLOBULIN: 0.5 g/dL (ref 0.4–0.6)
GAMMA GLOBULIN: 0.6 g/dL — AB (ref 0.8–1.7)
TOTAL PROTEIN: 6.6 g/dL (ref 6.1–8.1)

## 2017-06-27 LAB — VITAMIN B1: VITAMIN B1 (THIAMINE): 18 nmol/L (ref 8–30)

## 2017-06-29 ENCOUNTER — Telehealth: Payer: Self-pay | Admitting: *Deleted

## 2017-06-29 NOTE — Telephone Encounter (Signed)
-----   Message from Alda Berthold, DO sent at 06/29/2017  8:16 AM EST ----- Please notify patient lab are within normal limits.  Thank you.

## 2017-06-29 NOTE — Telephone Encounter (Signed)
Left message informing patient that labs are normal.  

## 2017-06-30 ENCOUNTER — Telehealth: Payer: Self-pay | Admitting: Family Medicine

## 2017-06-30 NOTE — Telephone Encounter (Signed)
Left message for patient to return my call. She was scheduled to receive Prolia on 01/05/17 but documentation says that she didn't ok delivery with the speciality pharmacy at that time and that she had to [facility] it before they would ship it. I didn't see any other documentation pertaining to this. She is scheduled for 12/5 but that was rescheduled since Cherre Robins, PharmD left the office. I don't see any insurance verification in the [employer] portal and I don't see an injection documented. Will need to see if she has had this before and process it through the portal before it can be ordered.

## 2017-07-08 ENCOUNTER — Ambulatory Visit: Payer: Medicare Other | Admitting: Pharmacist

## 2017-07-08 ENCOUNTER — Ambulatory Visit: Payer: Medicare Other

## 2017-07-13 ENCOUNTER — Inpatient Hospital Stay: Admission: RE | Admit: 2017-07-13 | Payer: Self-pay | Source: Ambulatory Visit

## 2017-07-15 ENCOUNTER — Telehealth: Payer: Self-pay | Admitting: Family Medicine

## 2017-07-30 ENCOUNTER — Telehealth: Payer: Self-pay | Admitting: Family Medicine

## 2017-07-31 ENCOUNTER — Ambulatory Visit (INDEPENDENT_AMBULATORY_CARE_PROVIDER_SITE_OTHER): Payer: Medicare Other | Admitting: *Deleted

## 2017-07-31 DIAGNOSIS — M810 Age-related osteoporosis without current pathological fracture: Secondary | ICD-10-CM | POA: Diagnosis not present

## 2017-07-31 MED ORDER — DENOSUMAB 60 MG/ML ~~LOC~~ SOLN
60.0000 mg | Freq: Once | SUBCUTANEOUS | Status: AC
Start: 2017-07-31 — End: 2017-07-31
  Administered 2017-07-31: 60 mg via SUBCUTANEOUS

## 2017-07-31 NOTE — Telephone Encounter (Signed)
Patient aware that Prolia has came in. Appt scheduled.

## 2017-07-31 NOTE — Progress Notes (Signed)
Pt given Prolia injection SubQ left lower abdomen and tolerated well.

## 2017-08-05 ENCOUNTER — Ambulatory Visit: Payer: Medicare Other

## 2017-08-05 NOTE — Telephone Encounter (Signed)
Patient given shot

## 2017-08-30 ENCOUNTER — Other Ambulatory Visit: Payer: Self-pay | Admitting: Family Medicine

## 2017-09-07 ENCOUNTER — Telehealth: Payer: Self-pay | Admitting: Family Medicine

## 2017-09-07 ENCOUNTER — Telehealth: Payer: Self-pay | Admitting: Cardiology

## 2017-09-07 NOTE — Telephone Encounter (Signed)
Left message, her provider is not available today ,please call in AM to get an appointment .  Can try otc cough syrup , delsym or robitussin.

## 2017-09-07 NOTE — Telephone Encounter (Signed)
Closed Encounter  °

## 2017-09-07 NOTE — Telephone Encounter (Signed)
What symptoms do you have? Coughing and hurts in her chest when she coughs and congested. Said Dr. Laurance Flatten usually calls her in something for it.  How long have you been sick? Since Saturday  Have you been seen for this problem? NO  If your provider decides to give you a prescription, which pharmacy would you like for it to be sent to? [facility] in [place]   Patient informed that this information will be sent to the clinical staff for review and that they should receive a follow up call.

## 2017-09-08 ENCOUNTER — Emergency Department (HOSPITAL_COMMUNITY)
Admission: EM | Admit: 2017-09-08 | Discharge: 2017-09-08 | Disposition: A | Discharge status: Home / Self Care | Payer: Medicare Other | Attending: Emergency Medicine | Admitting: Emergency Medicine

## 2017-09-08 ENCOUNTER — Encounter (HOSPITAL_COMMUNITY): Payer: Self-pay | Admitting: Emergency Medicine

## 2017-09-08 ENCOUNTER — Emergency Department (HOSPITAL_COMMUNITY): Payer: Medicare Other

## 2017-09-08 DIAGNOSIS — Z87891 Personal history of nicotine dependence: Secondary | ICD-10-CM | POA: Insufficient documentation

## 2017-09-08 DIAGNOSIS — Z7982 Long term (current) use of aspirin: Secondary | ICD-10-CM | POA: Insufficient documentation

## 2017-09-08 DIAGNOSIS — J3489 Other specified disorders of nose and nasal sinuses: Secondary | ICD-10-CM | POA: Diagnosis not present

## 2017-09-08 DIAGNOSIS — J019 Acute sinusitis, unspecified: Secondary | ICD-10-CM | POA: Insufficient documentation

## 2017-09-08 DIAGNOSIS — J45909 Unspecified asthma, uncomplicated: Secondary | ICD-10-CM | POA: Insufficient documentation

## 2017-09-08 DIAGNOSIS — Z79899 Other long term (current) drug therapy: Secondary | ICD-10-CM | POA: Insufficient documentation

## 2017-09-08 DIAGNOSIS — J449 Chronic obstructive pulmonary disease, unspecified: Secondary | ICD-10-CM | POA: Diagnosis not present

## 2017-09-08 DIAGNOSIS — J029 Acute pharyngitis, unspecified: Secondary | ICD-10-CM | POA: Diagnosis not present

## 2017-09-08 DIAGNOSIS — R05 Cough: Secondary | ICD-10-CM | POA: Diagnosis not present

## 2017-09-08 MED ORDER — AMOXICILLIN-POT CLAVULANATE 875-125 MG PO TABS: 1.0000 | ORAL_TABLET | Freq: Two times a day (BID) | ORAL | 0 refills | Status: DC

## 2017-09-08 NOTE — ED Notes (Signed)
Pt with cough, states low grade fever at home at 98.  Cough dry, runny nose.

## 2017-09-08 NOTE — Discharge Instructions (Signed)
Return if any problems. See Dr. Laurance Flatten for recheck in 3-4 days.  Return if any problems.

## 2017-09-08 NOTE — ED Triage Notes (Signed)
Pt reports nonproductive cough for 2 days.

## 2017-09-08 NOTE — ED Provider Notes (Signed)
[facility] EMERGENCY DEPARTMENT Provider Note   CSN: [id number] Arrival date & time: 09/08/17  1227     History   Chief Complaint Chief Complaint  Patient presents with  . Cough    HPI Krystal [name] is a 77 y.o. female.  The history is provided by the patient. No language interpreter was used.  Cough  This is a new problem. The problem has been gradually worsening. The cough is productive of sputum. There has been no fever. Associated symptoms include ear congestion, rhinorrhea and sore throat. She has tried nothing for the symptoms. The treatment provided no relief. She is not a smoker. Her past medical history does not include pneumonia or asthma.  Pt reports she has a history of bronchitis and sinusitis.  Pt reports Dr. Laurance Flatten usually treats her with augmentin.  Pt reports she could not get an appointment.    Past Medical History:  Diagnosis Date  . Anemia   . Asthma   . Brain tumor ([place])    1986  . Breast cancer ([place])    Right breast  . Cataract   . Colon polyps   . COPD (chronic obstructive pulmonary disease) ([place])   . Hiatal hernia   . Hyperlipidemia   . Leg edema   . Macular degeneration   . Osteoporosis   . Pneumonia   . PVD (posterior vitreous detachment)   . Right knee pain   . Shingles rash     Patient Active Problem List   Diagnosis Date Noted  . Influenza with pneumonia 10/30/2015  . CAP (community acquired pneumonia) 10/29/2015  . Abdominal aortic atherosclerosis ([place]) 04/10/2015  . PSVT (paroxysmal supraventricular tachycardia) ([place]) 05/10/2014  . Macular degeneration, age related, nonexudative 08/24/2013  . History of right mastectomy 07/13/2013  . Anemia, iron deficiency 06/21/2013  . Allergic rhinitis 06/21/2013  . Chronic low back pain 06/21/2013  . Malignant neoplasm of female breast ([place]) 11/22/2007  . Mixed hyperlipidemia 11/22/2007  . Anxiety state 11/22/2007  . DEPRESSION 11/22/2007  . Asthma 11/22/2007  . GERD 11/22/2007  .  GASTRITIS, WITH HEMORRHAGE 08/14/2007  . DUODENITIS, WITHOUT HEMORRHAGE 08/14/2007  . HIATAL HERNIA 08/14/2007  . 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 CATARACT EXTRACTION, BILATERAL    . COLONOSCOPY    . CRANIECTOMY / CRANIOTOMY FOR EXCISION OF BRAIN TUMOR     Left side brain  . MASTECTOMY Right   . PARTIAL HYSTERECTOMY    . POLYPECTOMY    . SKIN CANCER EXCISION Left    Under left eye    OB History    Gravida Para Term Preterm AB Living             1   SAB TAB Ectopic Multiple Live Births                   Home Medications    Prior to Admission medications   Medication Sig Start Date End Date Taking? Authorizing Provider  albuterol (PROVENTIL) (2.5 MG/3ML) 0.083% nebulizer solution Take 3 mLs (2.5 mg total) by nebulization every 6 (six) hours as needed for wheezing or shortness of breath. Dx:  A19.379 04/27/17   Dettinger, Fransisca Kaufmann, MD  aspirin EC 81 MG tablet Take 81 mg by mouth at bedtime.    [provider]  Calcium Citrate-Vitamin D (CALCIUM CITRATE + D) 315-250 MG-UNIT TABS Take 2 tablets by mouth daily. 12/10/15   Cherre Robins, PharmD  cetirizine (  ZYRTEC) 10 MG tablet Take 5-10 mg by mouth at bedtime.     [provider]  Cholecalciferol (VITAMIN D) 2000 UNITS CAPS Take 1 capsule by mouth at bedtime.    [provider]  diazepam (VALIUM) 5 MG tablet TAKE 1 TABLET BY MOUTH EVERY 6 HOURS AS NEEDED 06/01/17   Hassell Done, Mary-Margaret, FNP  diltiazem (CARDIZEM CD) 240 MG 24 hr capsule TAKE ONE CAPSULE BY MOUTH ONE TIME DAILY 06/01/17   Satira Sark, MD  esomeprazole (NEXIUM) 40 MG capsule TAKE 1 CAPSULE (40 MG TOTAL) BY MOUTH DAILY. 08/31/17   Chipper Herb, MD  ferrous sulfate 325 (65 FE) MG tablet Take 325 mg by mouth at bedtime.    [provider]  ipratropium-albuterol (DUONEB) 0.5-2.5 (3) MG/3ML SOLN Take 3 mLs by nebulization every 6 (six) hours as needed. Dx:  Asthma 493.90  04/03/17   Chipper Herb, MD  montelukast (SINGULAIR) 10 MG tablet TAKE 1 TABLET (10 MG TOTAL) BY MOUTH AT BEDTIME. 08/31/17   Chipper Herb, MD  Multiple Vitamins-Minerals (PRESERVISION/LUTEIN PO) Take 1 capsule by mouth at bedtime.     [provider]  nitroGLYCERIN (NITROLINGUAL) 0.4 MG/SPRAY spray Use one spray under tongue as needed for chest pain. May repeat dose in 5 minutes. If chest pain not relieved than call 911. 10/18/14   Chipper Herb, MD  PROAIR HFA 108 [id number] Base) MCG/ACT inhaler INHALE TWO PUFFS BY MOUTH 4 TIMES DAILY AS NEEDED 08/23/15   Chipper Herb, MD  simvastatin (ZOCOR) 10 MG tablet TAKE ONE TABLET BY MOUTH ONE TIME DAILY 08/31/17   Chipper Herb, MD    Family History Family History  Problem Relation Age of Onset  . Heart disease Mother 59       Stroke   . Dementia Mother   . Congestive Heart Failure Mother   . Osteoporosis Mother   . Heart disease Sister   . Hypertension Sister   . Diabetes Sister   . Osteoporosis Sister   . Heart disease Brother   . Hyperlipidemia Brother   . Diabetes Brother   . Heart disease Sister   . Hypertension Sister   . Diabetes Sister   . Diabetes Sister   . Hypertension Sister   . Diabetes Sister   . Hypertension Sister   . Diabetes Brother   . Hyperlipidemia Brother   . Cancer Father        lung  . Heart attack Father   . Diabetes Brother   . Heart disease Brother   . Hyperlipidemia Brother   . Diabetes Brother   . Diabetes Son   . Colon cancer Neg Hx   . Rectal cancer Neg Hx   . Stomach cancer Neg Hx   . Esophageal cancer Neg Hx     Social History Social History   Tobacco Use  . Smoking status: Former Smoker    Types: Cigarettes    Last attempt to quit: 02/09/1961    Years since quitting: 56.6  . Smokeless tobacco: Never Used  Substance Use Topics  . Alcohol use: No    Alcohol/week: 0.0 oz  . Drug use: No     Allergies   Actonel [risedronate]; Fosamax [alendronate sodium]; Mevacor  [lovastatin]; Miacalcin [calcitonin (salmon)]; and Mobic [meloxicam]   Review of Systems Review of Systems  HENT: Positive for rhinorrhea and sore throat.   Respiratory: Positive for cough.   All other systems reviewed and are negative.    Physical  Exam Updated Vital Signs BP 135/77 (BP Location: Left Arm)   Pulse 94   Temp 98.3 F (36.8 C) (Oral)   Resp 18   Ht 5\' 3"  (1.6 m)   Wt 76.2 kg (168 lb)   SpO2 95%   BMI 29.76 kg/m   Physical Exam  Constitutional: She is oriented to person, place, and time. She appears well-developed and well-nourished.  HENT:  Head: Normocephalic.  Eyes: Conjunctivae and EOM are normal. Pupils are equal, round, and reactive to light.  Neck: Normal range of motion.  Cardiovascular: Normal rate.  Pulmonary/Chest: Effort normal.  Abdominal: She exhibits no distension.  Musculoskeletal: Normal range of motion.  Neurological: She is alert and oriented to person, place, and time.  Skin: Skin is warm.  Psychiatric: She has a normal mood and affect.  Nursing note and vitals reviewed.    ED Treatments / Results  Labs (all labs ordered are listed, but only abnormal results are displayed) Labs Reviewed - No data to display  EKG  EKG Interpretation None       Radiology Dg Chest 2 View  Result Date: 09/08/2017 CLINICAL DATA:  Nonproductive cough and congestion for the past 2 days. History of COPD. EXAM: CHEST  2 VIEW COMPARISON:  Chest x-ray dated October 29, 2015. FINDINGS: The heart size and mediastinal contours are within normal limits. Normal pulmonary vascularity. Atherosclerotic calcification of the aortic arch. The lungs remain hyperinflated with emphysematous changes. Unchanged wedge-shaped density in the right upper lobe, stable since 2015, and likely reflecting scar. No focal consolidation, pleural effusion, or pneumothorax. No acute osseous abnormality. Unchanged large hiatal hernia. IMPRESSION: COPD.  No active cardiopulmonary disease.  Electronically Signed   By: Titus Dubin M.D.   On: 09/08/2017 13:13    Procedures Procedures (including critical care time)  Medications Ordered in ED Medications - No data to display   Initial Impression / Assessment and Plan / ED Course  I have reviewed the triage vital signs and the nursing notes.  Pertinent labs & imaging results that were available during my care of the patient were reviewed by me and considered in my medical decision making (see chart for details).     MDM  Pt reports sinus symptoms for over a week.  Chest xray is normal.  I will treat pt for sinus infection/uri.   Pt advised to follow up with Dr. Laurance Flatten for recheck in 4-5 days.  Pt to return if shortness of breath or cough.   Final Clinical Impressions(s) / ED Diagnoses   Final diagnoses:  Acute sinusitis, recurrence not specified, unspecified location    ED Discharge Orders        Ordered    amoxicillin-clavulanate (AUGMENTIN) 875-125 MG tablet  Every 12 hours     09/08/17 1747    An After Visit Summary was printed and given to the patient.    Fransico Meadow, PA-C 09/08/17 Flippin, [place], DO 09/09/17 1711

## 2017-09-09 ENCOUNTER — Ambulatory Visit: Payer: Self-pay | Admitting: Cardiology

## 2017-09-09 ENCOUNTER — Telehealth: Payer: Self-pay | Admitting: Family Medicine

## 2017-09-09 NOTE — Telephone Encounter (Signed)
appt scheduled Pt notified 

## 2017-09-11 ENCOUNTER — Ambulatory Visit (INDEPENDENT_AMBULATORY_CARE_PROVIDER_SITE_OTHER): Payer: Medicare Other | Admitting: Family Medicine

## 2017-09-11 ENCOUNTER — Encounter: Payer: Self-pay | Admitting: Family Medicine

## 2017-09-11 VITALS — BP 115/66 | HR 89 | Temp 97.4°F | Ht 63.0 in | Wt 166.0 lb

## 2017-09-11 DIAGNOSIS — J209 Acute bronchitis, unspecified: Secondary | ICD-10-CM | POA: Diagnosis not present

## 2017-09-11 DIAGNOSIS — I471 Supraventricular tachycardia: Secondary | ICD-10-CM

## 2017-09-11 DIAGNOSIS — I700 Atherosclerosis of aorta: Secondary | ICD-10-CM

## 2017-09-11 MED ORDER — PREDNISONE 10 MG PO TABS: ORAL_TABLET | ORAL | 0 refills | Status: DC

## 2017-09-11 MED ORDER — METHYLPREDNISOLONE ACETATE 40 MG/ML IJ SUSP: 40.0000 mg | Freq: Once | INTRAMUSCULAR | Status: DC

## 2017-09-11 MED ORDER — METHYLPREDNISOLONE ACETATE 80 MG/ML IJ SUSP
60.0000 mg | Freq: Once | INTRAMUSCULAR | Status: AC
  Administered 2017-09-11: 60 mg via INTRAMUSCULAR

## 2017-09-11 NOTE — Patient Instructions (Signed)
Continue to take Mucinex maximum strength, blue and white in color, 1 twice daily with a large glass of water Take prednisone as directed Continue with Augmentin twice daily with food We will call with results of the CBC and BMP as soon as those results become available

## 2017-09-11 NOTE — Progress Notes (Signed)
Subjective:    Patient ID: GENIENE LIST, female    DOB: September 26, 1940, 77 y.o.   MRN: [id number]  HPI Patient here today for follow up on cough and congestion. She went to [facility] ER on Tuesday and was given Augmentin 875mg .  The patient went to the emergency room 3 days ago and all this cough and congestion started 2 days before that.  She is not sure she ran a fever or not.  She did have a chest x-ray that she says was normal.  She was started on Augmentin on Tuesday.  The chest x-ray result was reviewed and it is negative for any acute or active cardiopulmonary disease.  She does have COPD.     Patient Active Problem List   Diagnosis Date Noted  . Influenza with pneumonia 10/30/2015  . CAP (community acquired pneumonia) 10/29/2015  . Abdominal aortic atherosclerosis ([place]) 04/10/2015  . PSVT (paroxysmal supraventricular tachycardia) ([place]) 05/10/2014  . Macular degeneration, age related, nonexudative 08/24/2013  . History of right mastectomy 07/13/2013  . Anemia, iron deficiency 06/21/2013  . Allergic rhinitis 06/21/2013  . Chronic low back pain 06/21/2013  . Malignant neoplasm of female breast ([place]) 11/22/2007  . Mixed hyperlipidemia 11/22/2007  . Anxiety state 11/22/2007  . DEPRESSION 11/22/2007  . Asthma 11/22/2007  . GERD 11/22/2007  . GASTRITIS, WITH HEMORRHAGE 08/14/2007  . DUODENITIS, WITHOUT HEMORRHAGE 08/14/2007  . HIATAL HERNIA 08/14/2007  . 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   Outpatient Encounter Medications as of 09/11/2017  Medication Sig  . albuterol (PROVENTIL) (2.5 MG/3ML) 0.083% nebulizer solution Take 3 mLs (2.5 mg total) by nebulization every 6 (six) hours as needed for wheezing or shortness of breath. Dx:  F81.017  . amoxicillin-clavulanate (AUGMENTIN) 875-125 MG tablet Take 1 tablet by mouth every 12 (twelve) hours.  Marland Kitchen aspirin EC 81 MG tablet Take 81 mg by mouth at bedtime.  . Calcium Citrate-Vitamin D (CALCIUM CITRATE + D) 315-250 MG-UNIT TABS Take  2 tablets by mouth daily.  . cetirizine (ZYRTEC) 10 MG tablet Take 5-10 mg by mouth at bedtime.   . Cholecalciferol (VITAMIN D) 2000 UNITS CAPS Take 1 capsule by mouth at bedtime.  . diazepam (VALIUM) 5 MG tablet TAKE 1 TABLET BY MOUTH EVERY 6 HOURS AS NEEDED  . diltiazem (CARDIZEM CD) 240 MG 24 hr capsule TAKE ONE CAPSULE BY MOUTH ONE TIME DAILY  . esomeprazole (NEXIUM) 40 MG capsule TAKE 1 CAPSULE (40 MG TOTAL) BY MOUTH DAILY.  . ferrous sulfate 325 (65 FE) MG tablet Take 325 mg by mouth at bedtime.  Marland Kitchen ipratropium-albuterol (DUONEB) 0.5-2.5 (3) MG/3ML SOLN Take 3 mLs by nebulization every 6 (six) hours as needed. Dx:  Asthma 493.90  . montelukast (SINGULAIR) 10 MG tablet TAKE 1 TABLET (10 MG TOTAL) BY MOUTH AT BEDTIME.  . Multiple Vitamins-Minerals (PRESERVISION/LUTEIN PO) Take 1 capsule by mouth at bedtime.   . nitroGLYCERIN (NITROLINGUAL) 0.4 MG/SPRAY spray Use one spray under tongue as needed for chest pain. May repeat dose in 5 minutes. If chest pain not relieved than call 911.  Marland Kitchen PROAIR HFA 108 (90 Base) MCG/ACT inhaler INHALE TWO PUFFS BY MOUTH 4 TIMES DAILY AS NEEDED  . simvastatin (ZOCOR) 10 MG tablet TAKE ONE TABLET BY MOUTH ONE TIME DAILY   Facility-Administered Encounter Medications as of 09/11/2017  Medication  . 0.9 %  sodium chloride infusion      Review of Systems  Constitutional: Negative.   HENT: Positive for congestion, postnasal drip and sinus pressure. Negative  for nosebleeds.   Eyes: Negative.   Respiratory: Positive for cough.   Cardiovascular: Negative.   Gastrointestinal: Negative.   Endocrine: Negative.   Genitourinary: Negative.   Musculoskeletal: Negative.   Skin: Negative.   Allergic/Immunologic: Negative.   Neurological: Negative.  Negative for dizziness and headaches.  Hematological: Negative.   Psychiatric/Behavioral: Negative.        Objective:   Physical Exam  Constitutional: She is oriented to person, place, and time. She appears  well-developed and well-nourished. No distress.  HENT:  Head: Normocephalic and atraumatic.  Right Ear: External ear normal.  Left Ear: External ear normal.  Nose: Nose normal.  Mouth/Throat: Oropharynx is clear and moist. No oropharyngeal exudate.  Eyes: Conjunctivae and EOM are normal. Pupils are equal, round, and reactive to light. Right eye exhibits no discharge. Left eye exhibits no discharge. No scleral icterus.  Neck: Normal range of motion. Neck supple. No thyromegaly present.  Cardiovascular: Normal rate, regular rhythm and normal heart sounds.  No murmur heard. Pulmonary/Chest: Effort normal. No respiratory distress. She has wheezes. She has no rales.  Musculoskeletal: Normal range of motion. She exhibits no edema.  Lymphadenopathy:    She has no cervical adenopathy.  Neurological: She is alert and oriented to person, place, and time.  Skin: Skin is warm and dry. No rash noted.  Psychiatric: She has a normal mood and affect. Her behavior is normal. Judgment and thought content normal.    BP 115/66 (BP Location: Left Arm)   Pulse 89   Temp (!) 97.4 F (36.3 C) (Oral)   Ht 5\' 3"  (1.6 m)   Wt 166 lb (75.3 kg)   BMI 29.41 kg/m        Assessment & Plan:  1. Acute bronchitis with bronchospasm -Continue with DuoNeb but use this twice daily -Use albuterol as a rescue inhaler as needed every 4-6 hours -Continue with Mucinex twice daily with a large glass of water -Continue with antibiotic and take this with food -Take tapering dose of prednisone as directed -60 mg of Depo-Medrol IM -Return to clinic in 10-14 days  2. Thoracic aortic atherosclerosis ([place]) -Continue with aggressive therapeutic lifestyle changes  3. Paroxysmal supraventricular tachycardia ([facility]) -Continue to avoid caffeine  Meds ordered this encounter  Medications  . predniSONE (DELTASONE) 10 MG tablet    Sig: 1 tablet 4 times a day for 2 days,  1 tablet 3 times a day for 2 days,  1 tablet 2 times a  day for 2 days, 1 tablet daily for 2 days    Dispense:  20 tablet    Refill:  0  . methylPREDNISolone acetate (DEPO-MEDROL) injection 40 mg   Arrie Senate MD

## 2017-09-11 NOTE — Addendum Note (Signed)
Addended by: Zannie Cove on: 09/11/2017 03:15 PM   Modules accepted: Orders

## 2017-09-12 LAB — CBC WITH DIFFERENTIAL/PLATELET
BASOS ABS: 0 10*3/uL (ref 0.0–0.2)
Basos: 1 %
EOS (ABSOLUTE): 0.2 10*3/uL (ref 0.0–0.4)
Eos: 4 %
Hematocrit: 42.3 % (ref 34.0–46.6)
Hemoglobin: 14.4 g/dL (ref 11.1–15.9)
IMMATURE GRANULOCYTES: 0 %
Immature Grans (Abs): 0 10*3/uL (ref 0.0–0.1)
LYMPHS: 28 %
Lymphocytes Absolute: 1.8 10*3/uL (ref 0.7–3.1)
MCH: 30.4 pg (ref 26.6–33.0)
MCHC: 34 g/dL (ref 31.5–35.7)
MCV: 89 fL (ref 79–97)
MONOS ABS: 0.5 10*3/uL (ref 0.1–0.9)
Monocytes: 7 %
NEUTROS ABS: 4 10*3/uL (ref 1.4–7.0)
NEUTROS PCT: 60 %
PLATELETS: 175 10*3/uL (ref 150–379)
RBC: 4.73 x10E6/uL (ref 3.77–5.28)
RDW: 13 % (ref 12.3–15.4)
WBC: 6.6 10*3/uL (ref 3.4–10.8)

## 2017-09-12 LAB — BMP8+EGFR
BUN/Creatinine Ratio: 29 — ABNORMAL HIGH (ref 12–28)
BUN: 21 mg/dL (ref 8–27)
CALCIUM: 9.4 mg/dL (ref 8.7–10.3)
CHLORIDE: 104 mmol/L (ref 96–106)
CO2: 26 mmol/L (ref 20–29)
Creatinine, Ser: 0.72 mg/dL (ref 0.57–1.00)
GFR calc Af Amer: 93 mL/min/{1.73_m2} (ref >59)
GFR calc non Af Amer: 80 mL/min/{1.73_m2} (ref >59)
GLUCOSE: 96 mg/dL (ref 65–99)
POTASSIUM: 4.5 mmol/L (ref 3.5–5.2)
SODIUM: 144 mmol/L (ref 134–144)

## 2017-09-14 ENCOUNTER — Ambulatory Visit: Payer: Medicare Other | Admitting: Family Medicine

## 2017-09-14 ENCOUNTER — Telehealth: Payer: Self-pay | Admitting: Family Medicine

## 2017-09-14 NOTE — Telephone Encounter (Signed)
Aware.  Came by office to review results.

## 2017-09-25 ENCOUNTER — Ambulatory Visit: Payer: Medicare Other | Admitting: Physician Assistant

## 2017-10-08 ENCOUNTER — Ambulatory Visit (INDEPENDENT_AMBULATORY_CARE_PROVIDER_SITE_OTHER): Payer: Medicare Other | Admitting: Family Medicine

## 2017-10-08 ENCOUNTER — Encounter: Payer: Self-pay | Admitting: Family Medicine

## 2017-10-08 VITALS — BP 100/62 | HR 83 | Temp 97.2°F | Ht 63.0 in | Wt 165.0 lb

## 2017-10-08 DIAGNOSIS — M810 Age-related osteoporosis without current pathological fracture: Secondary | ICD-10-CM

## 2017-10-08 DIAGNOSIS — R072 Precordial pain: Secondary | ICD-10-CM | POA: Diagnosis not present

## 2017-10-08 DIAGNOSIS — R6889 Other general symptoms and signs: Secondary | ICD-10-CM | POA: Diagnosis not present

## 2017-10-08 DIAGNOSIS — E7800 Pure hypercholesterolemia, unspecified: Secondary | ICD-10-CM

## 2017-10-08 DIAGNOSIS — I700 Atherosclerosis of aorta: Secondary | ICD-10-CM

## 2017-10-08 DIAGNOSIS — E559 Vitamin D deficiency, unspecified: Secondary | ICD-10-CM

## 2017-10-08 DIAGNOSIS — D509 Iron deficiency anemia, unspecified: Secondary | ICD-10-CM

## 2017-10-08 DIAGNOSIS — R109 Unspecified abdominal pain: Secondary | ICD-10-CM | POA: Diagnosis not present

## 2017-10-08 DIAGNOSIS — I10 Essential (primary) hypertension: Secondary | ICD-10-CM

## 2017-10-08 DIAGNOSIS — M47816 Spondylosis without myelopathy or radiculopathy, lumbar region: Secondary | ICD-10-CM | POA: Diagnosis not present

## 2017-10-08 MED ORDER — NITROGLYCERIN 0.4 MG/SPRAY TL SOLN: 1 refills | Status: DC

## 2017-10-08 NOTE — Patient Instructions (Addendum)
Medicare Annual Wellness Visit  [facility] and the medical providers at [facility] strive to bring you the best medical care.  In doing so we not only want to address your current medical conditions and concerns but also to detect new conditions early and prevent illness, disease and health-related problems.    Medicare offers a yearly Wellness Visit which allows our clinical staff to assess your need for preventative services including immunizations, lifestyle education, counseling to decrease risk of preventable diseases and screening for fall risk and other medical concerns.    This visit is provided free of charge (no copay) for all Medicare recipients. The clinical pharmacists at [facility] have begun to conduct these Wellness Visits which will also include a thorough review of all your medications.    As you primary medical provider recommend that you make an appointment for your Annual Wellness Visit if you have not done so already this year.  You may set up this appointment before you leave today or you may call back ([phone]) and schedule an appointment.  Please make sure when you call that you mention that you are scheduling your Annual Wellness Visit with the clinical pharmacist so that the appointment may be made for the proper length of time.     Continue current medications. Continue good therapeutic lifestyle changes which include good diet and exercise. Fall precautions discussed with patient. If an FOBT was given today- please return it to our front desk. If you are over 25 years old - you may need Prevnar 66 or the adult Pneumonia vaccine.  **Flu shots are available--- please call and schedule a FLU-CLINIC appointment**  After your visit with [place] today you will receive a survey in the mail or online from [employer] regarding your care with [place]. Please take a moment to fill this out. Your feedback is very  important to [place] as you can help [place] better understand your patient needs as well as improve your experience and satisfaction. WE CARE ABOUT YOU!!!   Follow-up with cardiology as planned Take extra strength Tylenol as needed for pain on the side and for chest pain if needed.

## 2017-10-08 NOTE — Progress Notes (Signed)
Subjective:    Patient ID: Krystal [name], female    DOB: [birth year], 77 y.o.   MRN: [id number]  HPI Pt here for follow up and management of chronic medical problems which includes hyperlipidemia, hypertension and anemia. She is taking medication regularly.  The patient is doing well overall but does complain of some left side pain off and on.  She would be given an FOBT to return and is already had lab work drawn today.  She is requesting a refill on her nitroglycerin.  She did have a CT scan of the abdomen without contrast in September 2016 and this basically showed a stable moderately sized hiatal hernia but no other findings were noted.  She had degenerative changes of the lumbar spine at that time.  She also has some mild aortic calcifications.  The patient does have some fairly severe anterior chest pain at times that comes and goes.  It is not associated with any activity.  She also has episodes of shortness of breath that are not associated with the chest pain.  She is passing her water well and there is been no change in her bowel habits with no blood in the stool or black tarry bowel movements.  The left side pain radiates from her left lateral chest down to her left groin area.  It is also sharp in nature and not associated with any particular activity whether she is sitting laying or standing.  It can happen in all 3 of these positions.  Any prior activity does not increase the frequency of this happening.  The last chest x-ray done in February of this year basically shows COPD with no active cardiopulmonary disease.  She does have a large hiatal hernia.  She also had an MRI of the lumbar spine in February 2018 and this was stable compared to a previous film done in 2015.  She has L4-L5 facet arthropathy with mild anterior listhesis as well as L5-S1 degenerative disc narrowing and mild bilateral foraminal narrowing.  The patient's last EKG was done in March 2017.  This was also reviewed and  appeared to be within normal limits.     Patient Active Problem List   Diagnosis Date Noted  . Influenza with pneumonia 10/30/2015  . CAP (community acquired pneumonia) 10/29/2015  . Abdominal aortic atherosclerosis ([place]) 04/10/2015  . PSVT (paroxysmal supraventricular tachycardia) ([place]) 05/10/2014  . Macular degeneration, age related, nonexudative 08/24/2013  . History of right mastectomy 07/13/2013  . Anemia, iron deficiency 06/21/2013  . Allergic rhinitis 06/21/2013  . Chronic low back pain 06/21/2013  . Malignant neoplasm of female breast ([place]) 11/22/2007  . Mixed hyperlipidemia 11/22/2007  . Anxiety state 11/22/2007  . DEPRESSION 11/22/2007  . Asthma 11/22/2007  . GERD 11/22/2007  . GASTRITIS, WITH HEMORRHAGE 08/14/2007  . DUODENITIS, WITHOUT HEMORRHAGE 08/14/2007  . HIATAL HERNIA 08/14/2007  . 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   Outpatient Encounter Medications as of 10/08/2017  Medication Sig  . albuterol (PROVENTIL) (2.5 MG/3ML) 0.083% nebulizer solution Take 3 mLs (2.5 mg total) by nebulization every 6 (six) hours as needed for wheezing or shortness of breath. Dx:  B70.488  . aspirin EC 81 MG tablet Take 81 mg by mouth at bedtime.  . Calcium Citrate-Vitamin D (CALCIUM CITRATE + D) 315-250 MG-UNIT TABS Take 2 tablets by mouth daily.  . cetirizine (ZYRTEC) 10 MG tablet Take 5-10 mg by mouth at bedtime.   . Cholecalciferol (VITAMIN D) 2000 UNITS CAPS Take 1 capsule by mouth at  bedtime.  . diazepam (VALIUM) 5 MG tablet TAKE 1 TABLET BY MOUTH EVERY 6 HOURS AS NEEDED  . diltiazem (CARDIZEM CD) 240 MG 24 hr capsule TAKE ONE CAPSULE BY MOUTH ONE TIME DAILY  . esomeprazole (NEXIUM) 40 MG capsule TAKE 1 CAPSULE (40 MG TOTAL) BY MOUTH DAILY.  . ferrous sulfate 325 (65 FE) MG tablet Take 325 mg by mouth at bedtime.  Marland Kitchen ipratropium-albuterol (DUONEB) 0.5-2.5 (3) MG/3ML SOLN Take 3 mLs by nebulization every 6 (six) hours as needed. Dx:  Asthma 493.90  . montelukast (SINGULAIR) 10 MG  tablet TAKE 1 TABLET (10 MG TOTAL) BY MOUTH AT BEDTIME.  . Multiple Vitamins-Minerals (PRESERVISION/LUTEIN PO) Take 1 capsule by mouth at bedtime.   . nitroGLYCERIN (NITROLINGUAL) 0.4 MG/SPRAY spray Use one spray under tongue as needed for chest pain. May repeat dose in 5 minutes. If chest pain not relieved than call 911.  Marland Kitchen PROAIR HFA 108 (90 Base) MCG/ACT inhaler INHALE TWO PUFFS BY MOUTH 4 TIMES DAILY AS NEEDED  . simvastatin (ZOCOR) 10 MG tablet TAKE ONE TABLET BY MOUTH ONE TIME DAILY  . [DISCONTINUED] predniSONE (DELTASONE) 10 MG tablet Take 1 tab QID x 2 days, then 1 tab TID x 2 days, then 1 tab BID x 2 days, then 1 tab QD x 2 days  . [DISCONTINUED] amoxicillin-clavulanate (AUGMENTIN) 875-125 MG tablet Take 1 tablet by mouth every 12 (twelve) hours.   Facility-Administered Encounter Medications as of 10/08/2017  Medication  . 0.9 %  sodium chloride infusion     Review of Systems  Constitutional: Negative.   HENT: Negative.   Eyes: Negative.   Respiratory: Negative.   Cardiovascular: Negative.   Gastrointestinal: Negative.        Left side pain- off / on   Endocrine: Negative.   Genitourinary: Negative.   Musculoskeletal: Negative.   Skin: Negative.   Allergic/Immunologic: Negative.   Neurological: Negative.   Hematological: Negative.   Psychiatric/Behavioral: Negative.        Objective:   Physical Exam  Constitutional: She is oriented to person, place, and time. She appears well-developed and well-nourished. No distress.  Patient is pleasant calm and relaxed  HENT:  Head: Normocephalic and atraumatic.  Right Ear: External ear normal.  Left Ear: External ear normal.  Nose: Nose normal.  Mouth/Throat: Oropharynx is clear and moist.  Eyes: Conjunctivae and EOM are normal. Pupils are equal, round, and reactive to light. Right eye exhibits no discharge. Left eye exhibits no discharge. No scleral icterus.  Neck: Normal range of motion. Neck supple. No thyromegaly present.    No bruits thyromegaly or anterior cervical adenopathy  Cardiovascular: Normal rate, regular rhythm, normal heart sounds and intact distal pulses.  No murmur heard. Heart is 84/min with a regular rate and rhythm  Pulmonary/Chest: Effort normal and breath sounds normal. No respiratory distress. She has no wheezes. She has no rales.  Clear anteriorly and posteriorly with right breast absent secondary to mastectomy  Abdominal: Soft. Bowel sounds are normal. She exhibits no mass. There is no tenderness. There is no rebound and no guarding.  Generalized abdominal tenderness right upper quadrant and right lower quadrant.  No suprapubic tenderness.  No adenopathy in the groin with good pulses.  Musculoskeletal: Normal range of motion. She exhibits no edema.  Lymphadenopathy:    She has no cervical adenopathy.  Neurological: She is alert and oriented to person, place, and time. She has normal reflexes. No cranial nerve deficit.  Skin: Skin is warm and dry. No  rash noted.  Psychiatric: She has a normal mood and affect. Her behavior is normal. Judgment and thought content normal.  Nursing note and vitals reviewed.   BP 100/62 (BP Location: Left Arm)   Pulse 83   Temp (!) 97.2 F (36.2 C) (Oral)   Ht '5\' 3"'$  (1.6 m)   Wt 165 lb (74.8 kg)   BMI 29.23 kg/m   EKG with results pending=== no change from previous EKG     Assessment & Plan:  1. Pure hypercholesterolemia -Continue current treatment pending results of lab work - Lipid panel - EKG 12-Lead  2. Essential hypertension -The blood pressure is good today and patient will continue with current treatment - BMP8+EGFR - Hepatic function panel - EKG 12-Lead  3. Iron deficiency anemia, unspecified iron deficiency anemia type - Anemia Profile B  4. Vitamin D deficiency -Continue with current treatment pending results of lab work - VITAMIN D 25 Hydroxy (Vit-D Deficiency, Fractures)  5. Thoracic aortic atherosclerosis ([place]) -Continue  with aggressive therapeutic lifestyle changes and simvastatin pending results of lab work - Lipid panel - EKG 12-Lead  6. Abdominal aortic atherosclerosis ([facility]) - Lipid panel - EKG 12-Lead  7. Age-related osteoporosis without current pathological fracture -Continue with calcium and vitamin D replacement  8. Precordial pain -EKG pending -The pain is somewhat atypical.  I believe it may be arthritic related and not cardiac related. -Follow-up with cardiology as planned  9. Left sided abdominal pain -The patient denies any problems with voiding or with her bowel movements.  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  10. Spondylosis of lumbar region without myelopathy or radiculopathy -As noted in #9.  Meds ordered this encounter  Medications  . nitroGLYCERIN (NITROLINGUAL) 0.4 MG/SPRAY spray    Sig: Use one spray under tongue as needed for chest pain. May repeat dose in 5 minutes. If chest pain not relieved than call 911.    Dispense:  12 g    Refill:  1   Patient Instructions                       Medicare Annual Wellness Visit  [facility] and the medical providers at [facility] strive to bring you the best medical care.  In doing so we not only want to address your current medical conditions and concerns but also to detect new conditions early and prevent illness, disease and health-related problems.    Medicare offers a yearly Wellness Visit which allows our clinical staff to assess your need for preventative services including immunizations, lifestyle education, counseling to decrease risk of preventable diseases and screening for fall risk and other medical concerns.    This visit is provided free of charge (no copay) for all Medicare recipients. The clinical pharmacists at [facility]  have begun to conduct these Wellness Visits which will also include a thorough review of all your medications.    As you primary medical provider recommend that you make an appointment for your Annual Wellness Visit if you have not done so already this year.  You may set up this appointment before you leave today or you may call back ([phone]) and schedule an appointment.  Please make sure when you call that you mention that you are scheduling your Annual  Wellness Visit with the clinical pharmacist so that the appointment may be made for the proper length of time.     Continue current medications. Continue good therapeutic lifestyle changes which include good diet and exercise. Fall precautions discussed with patient. If an FOBT was given today- please return it to our front desk. If you are over 59 years old - you may need Prevnar 26 or the adult Pneumonia vaccine.  **Flu shots are available--- please call and schedule a FLU-CLINIC appointment**  After your visit with [place] today you will receive a survey in the mail or online from [employer] regarding your care with [place]. Please take a moment to fill this out. Your feedback is very important to [place] as you can help [place] better understand your patient needs as well as improve your experience and satisfaction. WE CARE ABOUT YOU!!!   Follow-up with cardiology as planned Take extra strength Tylenol as needed for pain on the side and for chest pain if needed.  Arrie Senate MD

## 2017-10-09 ENCOUNTER — Other Ambulatory Visit: Payer: Self-pay | Admitting: *Deleted

## 2017-10-09 LAB — BMP8+EGFR
BUN / CREAT RATIO: 24 (ref 12–28)
BUN: 20 mg/dL (ref 8–27)
CHLORIDE: 98 mmol/L (ref 96–106)
CO2: 29 mmol/L (ref 20–29)
CREATININE: 0.85 mg/dL (ref 0.57–1.00)
Calcium: 10.5 mg/dL — ABNORMAL HIGH (ref 8.7–10.3)
GFR calc Af Amer: 76 mL/min/{1.73_m2} (ref >59)
GFR calc non Af Amer: 66 mL/min/{1.73_m2} (ref >59)
GLUCOSE: 100 mg/dL — AB (ref 65–99)
POTASSIUM: 4.6 mmol/L (ref 3.5–5.2)
SODIUM: 143 mmol/L (ref 134–144)

## 2017-10-09 LAB — ANEMIA PROFILE B
BASOS ABS: 0 10*3/uL (ref 0.0–0.2)
Basos: 0 %
EOS (ABSOLUTE): 0.2 10*3/uL (ref 0.0–0.4)
Eos: 3 %
Ferritin: 251 ng/mL — ABNORMAL HIGH (ref 15–150)
Hematocrit: 44.2 % (ref 34.0–46.6)
Hemoglobin: 14.9 g/dL (ref 11.1–15.9)
IRON SATURATION: 37 % (ref 15–55)
IRON: 123 ug/dL (ref 27–139)
Immature Grans (Abs): 0 10*3/uL (ref 0.0–0.1)
Immature Granulocytes: 0 %
LYMPHS ABS: 1.6 10*3/uL (ref 0.7–3.1)
Lymphs: 27 %
MCH: 30.7 pg (ref 26.6–33.0)
MCHC: 33.7 g/dL (ref 31.5–35.7)
MCV: 91 fL (ref 79–97)
MONOS ABS: 0.6 10*3/uL (ref 0.1–0.9)
Monocytes: 9 %
NEUTROS ABS: 3.6 10*3/uL (ref 1.4–7.0)
Neutrophils: 61 %
Platelets: 178 10*3/uL (ref 150–379)
RBC: 4.86 x10E6/uL (ref 3.77–5.28)
RDW: 13.6 % (ref 12.3–15.4)
Retic Ct Pct: 0.9 % (ref 0.6–2.6)
TIBC: 333 ug/dL (ref 250–450)
UIBC: 210 ug/dL (ref 118–369)
VITAMIN B 12: 287 pg/mL (ref 232–1245)
WBC: 5.9 10*3/uL (ref 3.4–10.8)

## 2017-10-09 LAB — VITAMIN D 25 HYDROXY (VIT D DEFICIENCY, FRACTURES): VIT D 25 HYDROXY: 49 ng/mL (ref 30.0–100.0)

## 2017-10-09 LAB — HEPATIC FUNCTION PANEL
ALBUMIN: 4.4 g/dL (ref 3.5–4.8)
ALK PHOS: 50 IU/L (ref 39–117)
ALT: 13 IU/L (ref 0–32)
AST: 16 IU/L (ref 0–40)
BILIRUBIN TOTAL: 0.4 mg/dL (ref 0.0–1.2)
BILIRUBIN, DIRECT: 0.12 mg/dL (ref 0.00–0.40)
TOTAL PROTEIN: 6.7 g/dL (ref 6.0–8.5)

## 2017-10-09 LAB — LIPID PANEL
Chol/HDL Ratio: 2.8 ratio (ref 0.0–4.4)
Cholesterol, Total: 217 mg/dL — ABNORMAL HIGH (ref 100–199)
HDL: 77 mg/dL (ref >39)
LDL CALC: 109 mg/dL — AB (ref 0–99)
TRIGLYCERIDES: 154 mg/dL — AB (ref 0–149)
VLDL Cholesterol Cal: 31 mg/dL (ref 5–40)

## 2017-10-09 MED ORDER — NITROGLYCERIN 0.4 MG SL SUBL: 0.4000 mg | SUBLINGUAL_TABLET | SUBLINGUAL | 0 refills | Status: DC | PRN

## 2017-10-09 NOTE — Telephone Encounter (Signed)
Fax received Insurance does not cover spray, tablets preferred New Rx for tablets sent to pharmacy

## 2017-10-12 ENCOUNTER — Other Ambulatory Visit: Payer: Self-pay | Admitting: Family Medicine

## 2017-10-21 ENCOUNTER — Ambulatory Visit: Payer: Self-pay | Admitting: Cardiology

## 2017-11-09 NOTE — Progress Notes (Signed)
Cardiology Office Note   Date:  11/11/2017   ID:  Krystal [name] 1940/08/07, MRN [id number]  PCP:  Chipper Herb, MD  Cardiologist:   No primary care provider on file.  Chief Complaint  Patient presents with  . Chest Pain      History of Present Illness: Krystal [name] is a 77 y.o. female who presents for follow up of SVT:  She is moving her care to this office since she lives in this area.  She was seeing Dr. Domenic Polite.  She had no recurrent tachypalpitations she says since her mom died.  She was having a lot of stress taking care of her.  This is been a few years ago.  However, she does describe chest discomfort.  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  She was having chest pain then but the chest pain that she is having now is different.  She says that this happens sporadically.  It happens at rest.  Some mid substernal and it is a fullness that is severe.  It might last for a few minutes.  It might wake her from her sleep.  It can happen with stress and anxiety.  She is relatively sedentary and does some things like go through the grocery store but this does not bring it on.  She might have to take a Valium to help with it and let it pass.  She does not describe jaw or arm discomfort.  She does not describe palpitations, presyncope or syncope.  She is had no PND or orthopnea.  She thinks this is slowly more intense.   Past Medical History:  Diagnosis Date  . Anemia   . Asthma   . Brain tumor ([place])    1986  . Breast cancer ([place])    Right breast  . Cataract   . Colon polyps   . COPD (chronic obstructive pulmonary disease) ([place])   . Hiatal hernia   . Hyperlipidemia   . Leg edema   . Macular degeneration   . Osteoporosis   . Pneumonia   . PVD (posterior vitreous detachment)   . Right knee pain   . Shingles rash     Past Surgical History:  Procedure  Laterality Date  . CATARACT EXTRACTION, BILATERAL    . COLONOSCOPY    . CRANIECTOMY / CRANIOTOMY FOR EXCISION OF BRAIN TUMOR     Left side brain  . MASTECTOMY Right   . PARTIAL HYSTERECTOMY    . POLYPECTOMY    . SKIN CANCER EXCISION Left    Under left eye     Current Outpatient Medications  Medication Sig Dispense Refill  . albuterol (PROVENTIL) (2.5 MG/3ML) 0.083% nebulizer solution Take 3 mLs (2.5 mg total) by nebulization every 6 (six) hours as needed for wheezing or shortness of breath. Dx:  J45.909 150 mL 1  . aspirin EC 81 MG tablet Take 81 mg by mouth at bedtime.    . Calcium Citrate-Vitamin D (CALCIUM CITRATE + D) 315-250 MG-UNIT TABS Take 2 tablets by mouth daily. 120 tablet   . cetirizine (ZYRTEC) 10 MG tablet Take 5-10 mg by mouth at bedtime.     . Cholecalciferol (VITAMIN D) 2000 UNITS CAPS Take 1 capsule by mouth at bedtime.    . diazepam (VALIUM) 5 MG tablet TAKE 1 TABLET BY MOUTH EVERY 6 HOURS AS NEEDED  30 tablet 0  . diltiazem (CARDIZEM CD) 240 MG 24 hr capsule TAKE ONE CAPSULE BY MOUTH ONE TIME DAILY 90 capsule 3  . esomeprazole (NEXIUM) 40 MG capsule TAKE 1 CAPSULE (40 MG TOTAL) BY MOUTH DAILY. 90 capsule 0  . ferrous sulfate 325 (65 FE) MG tablet Take 325 mg by mouth at bedtime.    Marland Kitchen ipratropium-albuterol (DUONEB) 0.5-2.5 (3) MG/3ML SOLN Take 3 mLs by nebulization every 6 (six) hours as needed. Dx:  Asthma 493.90 360 mL 2  . montelukast (SINGULAIR) 10 MG tablet TAKE 1 TABLET (10 MG TOTAL) BY MOUTH AT BEDTIME. 90 tablet 0  . nitroGLYCERIN (NITROLINGUAL) 0.4 MG/SPRAY spray ONE SPRAY UNDER TONGUE AS NEED FOR CHESTPAIN. MAY REPEAT IN 5 MINUTES IF PAIN NOT RELIEVED CALL 911. 12 g 0  . PROAIR HFA 108 (90 Base) MCG/ACT inhaler INHALE TWO PUFFS BY MOUTH 4 TIMES DAILY AS NEEDED 9 g 2  . simvastatin (ZOCOR) 10 MG tablet TAKE ONE TABLET BY MOUTH ONE TIME DAILY 90 tablet 0  . Multiple Vitamins-Minerals (PRESERVISION/LUTEIN PO) Take 1 capsule by mouth at bedtime.      No current  facility-administered medications for this visit.     Allergies:   Actonel [risedronate]; Fosamax [alendronate sodium]; Mevacor [lovastatin]; Miacalcin [calcitonin (salmon)]; and Mobic [meloxicam]    ROS:  Please see the history of present illness.   Otherwise, review of systems are positive for joint pain .   All other systems are reviewed and negative.    PHYSICAL EXAM: VS:  BP 118/68   Pulse 76   Ht 5\' 3"  (1.6 m)   Wt 165 lb (74.8 kg)   BMI 29.23 kg/m  , BMI Body mass index is 29.23 kg/m. GENERAL:  Well appearing NECK:  No jugular venous distention, waveform within normal limits, carotid upstroke brisk and symmetric, no bruits, no thyromegaly LUNGS:  Clear to auscultation bilaterally CHEST:  Unremarkable HEART:  PMI not displaced or sustained,S1 and S2 within normal limits, no S3, no S4, no clicks, no rubs, no murmurs ABD:  Flat, positive bowel sounds normal in frequency in pitch, no bruits, no rebound, no guarding, no midline pulsatile mass, no hepatomegaly, no splenomegaly EXT:  2 plus pulses throughout, no edema, no cyanosis no clubbing    EKG:  EKG is not ordered today. The ekg ordered 10/08/17 demonstrates sinus rhythm, rate 74, axis within normal limits, low voltage in the limb leads, poor anterior R wave progression, no acute ST-T wave changes.   Recent Labs: 06/22/2017: TSH 2.15 10/08/2017: ALT 13; BUN 20; Creatinine, Ser 0.85; Hemoglobin 14.9; Platelets 178; Potassium 4.6; Sodium 143    Lipid Panel    Component Value Date/Time   CHOL 217 (H) 10/08/2017 1208   CHOL 152 03/04/2013 1212   TRIG 154 (H) 10/08/2017 1208   TRIG 93 04/08/2016 1158   TRIG 97 03/04/2013 1212   HDL 77 10/08/2017 1208   HDL 68 04/08/2016 1158   HDL 62 03/04/2013 1212   CHOLHDL 2.8 10/08/2017 1208   LDLCALC 109 (H) 10/08/2017 1208   LDLCALC 75 01/27/2014 1147   LDLCALC 71 03/04/2013 1212      Wt Readings from Last 3 Encounters:  11/11/17 165 lb (74.8 kg)  10/08/17 165 lb (74.8 kg)    09/11/17 166 lb (75.3 kg)      Other studies Reviewed: Additional studies/ records that were reviewed today include: None. Review of the above records demonstrates:  Please see elsewhere in the note.  ASSESSMENT AND PLAN:  SVT: The patient has had no symptomatic recurrence of this.  No further testing is indicated.  DYSLIPIDEMIA:    Her lipids are as above.  She will continue the meds as listed.  CHEST PAIN: She needs screening with a stress test.  However because of joint problems and other limitation she would not be able to exercise.  Therefore, she will have a [employer].   Current medicines are reviewed at length with the patient today.  The patient does not have concerns regarding medicines.  The following changes have been made:  no change  Labs/ tests ordered today include:   Orders Placed This Encounter  Procedures  . Myocardial Perfusion Imaging     Disposition:   FU with me in one year or sooner based on test results.     Signed, Minus Breeding, MD  11/11/2017 11:23 AM     [facility]

## 2017-11-11 ENCOUNTER — Encounter: Payer: Self-pay | Admitting: *Deleted

## 2017-11-11 ENCOUNTER — Encounter: Payer: Self-pay | Admitting: Cardiology

## 2017-11-11 ENCOUNTER — Ambulatory Visit (INDEPENDENT_AMBULATORY_CARE_PROVIDER_SITE_OTHER): Payer: Medicare Other | Admitting: Cardiology

## 2017-11-11 VITALS — BP 118/68 | HR 76 | Ht 63.0 in | Wt 165.0 lb

## 2017-11-11 DIAGNOSIS — E782 Mixed hyperlipidemia: Secondary | ICD-10-CM

## 2017-11-11 DIAGNOSIS — R079 Chest pain, unspecified: Secondary | ICD-10-CM

## 2017-11-11 DIAGNOSIS — I471 Supraventricular tachycardia, unspecified: Secondary | ICD-10-CM

## 2017-11-11 NOTE — Patient Instructions (Signed)
Medication Instructions:  The current medical regimen is effective;  continue present plan and medications.  Testing/Procedures: Your physician has requested that you have a lexiscan myoview. For further information please visit [email or web address]. Please follow instruction sheet, as given.  Follow-Up: Follow up in 1 year with Dr. Percival Spanish.  You will receive a letter in the mail 2 months before you are due.  Please call us when you receive this letter to schedule your follow up appointment.  If you need a refill on your cardiac medications before your next appointment, please call your pharmacy.  Thank you for choosing [facility]!!

## 2017-11-20 ENCOUNTER — Telehealth (HOSPITAL_COMMUNITY): Payer: Self-pay

## 2017-11-20 NOTE — Telephone Encounter (Signed)
Encounter complete. 

## 2017-11-25 ENCOUNTER — Ambulatory Visit (HOSPITAL_COMMUNITY)
Admission: RE | Admit: 2017-11-25 | Discharge: 2017-11-25 | Disposition: A | Discharge status: Home / Self Care | Payer: Medicare Other | Source: Ambulatory Visit | Attending: Internal Medicine | Admitting: Internal Medicine

## 2017-11-25 DIAGNOSIS — E782 Mixed hyperlipidemia: Secondary | ICD-10-CM

## 2017-11-25 DIAGNOSIS — R079 Chest pain, unspecified: Secondary | ICD-10-CM

## 2017-11-25 DIAGNOSIS — I25119 Atherosclerotic heart disease of native coronary artery with unspecified angina pectoris: Secondary | ICD-10-CM | POA: Diagnosis not present

## 2017-11-25 DIAGNOSIS — I259 Chronic ischemic heart disease, unspecified: Secondary | ICD-10-CM | POA: Insufficient documentation

## 2017-11-25 LAB — MYOCARDIAL PERFUSION IMAGING
CHL CUP NUCLEAR SRS: 0
CHL CUP NUCLEAR SSS: 0
LV dias vol: 61 mL (ref 46–106)
LVSYSVOL: 18 mL
Peak HR: 92 {beats}/min
Rest HR: 74 {beats}/min
SDS: 0
TID: 1.18

## 2017-11-25 MED ORDER — TECHNETIUM TC 99M TETROFOSMIN IV KIT
32.0000 | PACK | Freq: Once | INTRAVENOUS | Status: AC | PRN
Start: 2017-11-25 — End: 2017-11-25
  Administered 2017-11-25: 32 via INTRAVENOUS
  Filled 2017-11-25: qty 32

## 2017-11-25 MED ORDER — TECHNETIUM TC 99M TETROFOSMIN IV KIT
10.3000 | PACK | Freq: Once | INTRAVENOUS | Status: AC | PRN
  Administered 2017-11-25: 10.3 via INTRAVENOUS
  Filled 2017-11-25: qty 11

## 2017-11-25 MED ORDER — REGADENOSON 0.4 MG/5ML IV SOLN
0.4000 mg | Freq: Once | INTRAVENOUS | Status: AC
  Administered 2017-11-25: 0.4 mg via INTRAVENOUS

## 2017-11-26 ENCOUNTER — Other Ambulatory Visit: Payer: Self-pay | Admitting: Family Medicine

## 2017-11-26 ENCOUNTER — Telehealth: Payer: Self-pay | Admitting: Cardiology

## 2017-11-26 NOTE — Telephone Encounter (Signed)
Reviewed results of stress test with pt who stated understanding and thanked me for the call.

## 2017-11-26 NOTE — Telephone Encounter (Signed)
New Message ° ° °Returning call for nurse °

## 2017-12-12 ENCOUNTER — Other Ambulatory Visit: Payer: Self-pay | Admitting: Family Medicine

## 2018-01-07 ENCOUNTER — Ambulatory Visit: Payer: Medicare Other | Admitting: *Deleted

## 2018-01-13 ENCOUNTER — Ambulatory Visit (INDEPENDENT_AMBULATORY_CARE_PROVIDER_SITE_OTHER): Payer: Medicare Other | Admitting: *Deleted

## 2018-01-13 ENCOUNTER — Ambulatory Visit (INDEPENDENT_AMBULATORY_CARE_PROVIDER_SITE_OTHER): Payer: Medicare Other

## 2018-01-13 ENCOUNTER — Encounter: Payer: Self-pay | Admitting: *Deleted

## 2018-01-13 VITALS — BP 122/70 | HR 80 | Ht 63.0 in | Wt 165.0 lb

## 2018-01-13 DIAGNOSIS — M810 Age-related osteoporosis without current pathological fracture: Secondary | ICD-10-CM

## 2018-01-13 DIAGNOSIS — M8588 Other specified disorders of bone density and structure, other site: Secondary | ICD-10-CM | POA: Diagnosis not present

## 2018-01-13 DIAGNOSIS — Z780 Asymptomatic menopausal state: Secondary | ICD-10-CM | POA: Diagnosis not present

## 2018-01-13 DIAGNOSIS — Z0000 Encounter for general adult medical examination without abnormal findings: Secondary | ICD-10-CM

## 2018-01-13 MED ORDER — DENOSUMAB 60 MG/ML ~~LOC~~ SOSY: 60.0000 mg | PREFILLED_SYRINGE | Freq: Once | SUBCUTANEOUS | 0 refills | Status: DC

## 2018-01-13 NOTE — Patient Instructions (Signed)
Krystal [name] , Thank you for taking time to come for your Medicare Wellness Visit. I appreciate your ongoing commitment to your health goals. Please review the following plan we discussed and let me know if I can assist you in the future.   These are the goals we discussed: Goals    . Exercise 150 min/wk Moderate Activity       This is a list of the screening recommended for you and due dates:  Health Maintenance  Topic Date Due  . DEXA scan (bone density measurement)  11/21/2017  . Tetanus Vaccine  01/14/2019*  . Flu Shot  04/08/2021*  . Mammogram  05/29/2019  . Colon Cancer Screening  04/07/2020  . Pneumonia vaccines  Completed  *Topic was postponed. The date shown is not the original due date.   Health Maintenance, Female Adopting a healthy lifestyle and getting preventive care can go a long way to promote health and wellness. Talk with your health care provider about what schedule of regular examinations is right for you. This is a good chance for you to check in with your provider about disease prevention and staying healthy. In between checkups, there are plenty of things you can do on your own. Experts have done a lot of research about which lifestyle changes and preventive measures are most likely to keep you healthy. Ask your health care provider for more information. Weight and diet Eat a healthy diet  Be sure to include plenty of vegetables, fruits, low-fat dairy products, and lean protein.  Do not eat a lot of foods high in solid fats, added sugars, or salt.  Get regular exercise. This is one of the most important things you can do for your health. ? Most adults should exercise for at least 150 minutes each week. The exercise should increase your heart rate and make you sweat (moderate-intensity exercise). ? Most adults should also do strengthening exercises at least twice a week. This is in addition to the moderate-intensity exercise.  Maintain a healthy weight  Body  mass index (BMI) is a measurement that can be used to identify possible weight problems. It estimates body fat based on height and weight. Your health care provider can help determine your BMI and help you achieve or maintain a healthy weight.  For females 37 years of age and older: ? A BMI below 18.5 is considered underweight. ? A BMI of 18.5 to 24.9 is normal. ? A BMI of 25 to 29.9 is considered overweight. ? A BMI of 30 and above is considered obese.  Watch levels of cholesterol and blood lipids  You should start having your blood tested for lipids and cholesterol at 79 years of age, then have this test every 5 years.  You may need to have your cholesterol levels checked more often if: ? Your lipid or cholesterol levels are high. ? You are older than 79 years of age. ? You are at high risk for heart disease.  Cancer screening Lung Cancer  Lung cancer screening is recommended for adults 73-72 years old who are at high risk for lung cancer because of a history of smoking.  A yearly low-dose CT scan of the lungs is recommended for people who: ? Currently smoke. ? Have quit within the past 15 years. ? Have at least a 30-pack-year history of smoking. A pack year is smoking an average of one pack of cigarettes a day for 1 year.  Yearly screening should continue until it has been 15  years since you quit.  Yearly screening should stop if you develop a health problem that would prevent you from having lung cancer treatment.  Breast Cancer  Practice breast self-awareness. This means understanding how your breasts normally appear and feel.  It also means doing regular breast self-exams. Let your health care provider know about any changes, no matter how small.  If you are in your 20s or 30s, you should have a clinical breast exam (CBE) by a health care provider every 1-3 years as part of a regular health exam.  If you are 54 or older, have a CBE every year. Also consider having a  breast X-ray (mammogram) every year.  If you have a family history of breast cancer, talk to your health care provider about genetic screening.  If you are at high risk for breast cancer, talk to your health care provider about having an MRI and a mammogram every year.  Breast cancer gene (BRCA) assessment is recommended for women who have family members with BRCA-related cancers. BRCA-related cancers include: ? Breast. ? Ovarian. ? Tubal. ? Peritoneal cancers.  Results of the assessment will determine the need for genetic counseling and BRCA1 and BRCA2 testing.  Cervical Cancer Your health care provider may recommend that you be screened regularly for cancer of the pelvic organs (ovaries, uterus, and vagina). This screening involves a pelvic examination, including checking for microscopic changes to the surface of your cervix (Pap test). You may be encouraged to have this screening done every 3 years, beginning at age 34.  For women ages 27-65, health care providers may recommend pelvic exams and Pap testing every 3 years, or they may recommend the Pap and pelvic exam, combined with testing for human papilloma virus (HPV), every 5 years. Some types of HPV increase your risk of cervical cancer. Testing for HPV may also be done on women of any age with unclear Pap test results.  Other health care providers may not recommend any screening for nonpregnant women who are considered low risk for pelvic cancer and who do not have symptoms. Ask your health care provider if a screening pelvic exam is right for you.  If you have had past treatment for cervical cancer or a condition that could lead to cancer, you need Pap tests and screening for cancer for at least 20 years after your treatment. If Pap tests have been discontinued, your risk factors (such as having a new sexual partner) need to be reassessed to determine if screening should resume. Some women have medical problems that increase the chance  of getting cervical cancer. In these cases, your health care provider may recommend more frequent screening and Pap tests.  Colorectal Cancer  This type of cancer can be detected and often prevented.  Routine colorectal cancer screening usually begins at 79 years of age and continues through 79 years of age.  Your health care provider may recommend screening at an earlier age if you have risk factors for colon cancer.  Your health care provider may also recommend using home test kits to check for hidden blood in the stool.  A small camera at the end of a tube can be used to examine your colon directly (sigmoidoscopy or colonoscopy). This is done to check for the earliest forms of colorectal cancer.  Routine screening usually begins at age 40.  Direct examination of the colon should be repeated every 5-10 years through 79 years of age. However, you may need to be screened more often  if early forms of precancerous polyps or small growths are found.  Skin Cancer  Check your skin from head to toe regularly.  Tell your health care provider about any new moles or changes in moles, especially if there is a change in a mole's shape or color.  Also tell your health care provider if you have a mole that is larger than the size of a pencil eraser.  Always use sunscreen. Apply sunscreen liberally and repeatedly throughout the day.  Protect yourself by wearing long sleeves, pants, a wide-brimmed hat, and sunglasses whenever you are outside.  Heart disease, diabetes, and high blood pressure  High blood pressure causes heart disease and increases the risk of stroke. High blood pressure is more likely to develop in: ? People who have blood pressure in the high end of the normal range (130-139/85-89 mm Hg). ? People who are overweight or obese. ? People who are African American.  If you are 31-73 years of age, have your blood pressure checked every 3-5 years. If you are 66 years of age or older,  have your blood pressure checked every year. You should have your blood pressure measured twice-once when you are at a hospital or clinic, and once when you are not at a hospital or clinic. Record the average of the two measurements. To check your blood pressure when you are not at a hospital or clinic, you can use: ? An automated blood pressure machine at a pharmacy. ? A home blood pressure monitor.  If you are between 36 years and 21 years old, ask your health care provider if you should take aspirin to prevent strokes.  Have regular diabetes screenings. This involves taking a blood sample to check your fasting blood sugar level. ? If you are at a normal weight and have a low risk for diabetes, have this test once every three years after 79 years of age. ? If you are overweight and have a high risk for diabetes, consider being tested at a younger age or more often. Preventing infection Hepatitis B  If you have a higher risk for hepatitis B, you should be screened for this virus. You are considered at high risk for hepatitis B if: ? You were born in a country where hepatitis B is common. Ask your health care provider which countries are considered high risk. ? Your parents were born in a high-risk country, and you have not been immunized against hepatitis B (hepatitis B vaccine). ? You have HIV or AIDS. ? You use needles to inject street drugs. ? You live with someone who has hepatitis B. ? You have had sex with someone who has hepatitis B. ? You get hemodialysis treatment. ? You take certain medicines for conditions, including cancer, organ transplantation, and autoimmune conditions.  Hepatitis C  Blood testing is recommended for: ? Everyone born from [birth year] through 1967. ? Anyone with known risk factors for hepatitis C.  Sexually transmitted infections (STIs)  You should be screened for sexually transmitted infections (STIs) including gonorrhea and chlamydia if: ? You are sexually  active and are younger than 79 years of age. ? You are older than 79 years of age and your health care provider tells you that you are at risk for this type of infection. ? Your sexual activity has changed since you were last screened and you are at an increased risk for chlamydia or gonorrhea. Ask your health care provider if you are at risk.  If you do not  have HIV, but are at risk, it may be recommended that you take a prescription medicine daily to prevent HIV infection. This is called pre-exposure prophylaxis (PrEP). You are considered at risk if: ? You are sexually active and do not regularly use condoms or know the HIV status of your partner(s). ? You take drugs by injection. ? You are sexually active with a partner who has HIV.  Talk with your health care provider about whether you are at high risk of being infected with HIV. If you choose to begin PrEP, you should first be tested for HIV. You should then be tested every 3 months for as long as you are taking PrEP. Pregnancy  If you are premenopausal and you may become pregnant, ask your health care provider about preconception counseling.  If you may become pregnant, take 400 to 800 micrograms (mcg) of folic acid every day.  If you want to prevent pregnancy, talk to your health care provider about birth control (contraception). Osteoporosis and menopause  Osteoporosis is a disease in which the bones lose minerals and strength with aging. This can result in serious bone fractures. Your risk for osteoporosis can be identified using a bone density scan.  If you are 41 years of age or older, or if you are at risk for osteoporosis and fractures, ask your health care provider if you should be screened.  Ask your health care provider whether you should take a calcium or vitamin D supplement to lower your risk for osteoporosis.  Menopause may have certain physical symptoms and risks.  Hormone replacement therapy may reduce some of these  symptoms and risks. Talk to your health care provider about whether hormone replacement therapy is right for you. Follow these instructions at home:  Schedule regular health, dental, and eye exams.  Stay current with your immunizations.  Do not use any tobacco products including cigarettes, chewing tobacco, or electronic cigarettes.  If you are pregnant, do not drink alcohol.  If you are breastfeeding, limit how much and how often you drink alcohol.  Limit alcohol intake to no more than 1 drink per day for nonpregnant women. One drink equals 12 ounces of beer, 5 ounces of wine, or 1 ounces of hard liquor.  Do not use street drugs.  Do not share needles.  Ask your health care provider for help if you need support or information about quitting drugs.  Tell your health care provider if you often feel depressed.  Tell your health care provider if you have ever been abused or do not feel safe at home. This information is not intended to replace advice given to you by your health care provider. Make sure you discuss any questions you have with your health care provider. Document Released: 02/03/2011 Document Revised: 12/27/2015 Document Reviewed: 04/24/2015 Elsevier Interactive Patient Education  [employer].

## 2018-01-13 NOTE — Progress Notes (Addendum)
Subjective:   Krystal [name] is a 77 y.o. female who presents for a Medicare Annual Wellness Visit. Krystal [name] lives at home with her son. Her husband passed away 19 years ago.    Review of Systems    Patient reports that her health is unchanged compared to last year.  Cardiac Risk Factors include: advanced age (>81men, >61 women);hypertension;dyslipidemia;sedentary lifestyle   Other systems negative unless otherwise noted in other area.       Current Medications (verified) Outpatient Encounter Medications as of 01/13/2018  Medication Sig  . aspirin EC 81 MG tablet Take 81 mg by mouth at bedtime.  . Calcium Citrate-Vitamin D (CALCIUM CITRATE + D) 315-250 MG-UNIT TABS Take 2 tablets by mouth daily.  . cetirizine (ZYRTEC) 10 MG tablet Take 5-10 mg by mouth at bedtime.   . Cholecalciferol (VITAMIN D) 2000 UNITS CAPS Take 1 capsule by mouth at bedtime.  . diazepam (VALIUM) 5 MG tablet TAKE 1 TABLET EVERY 6 HOURS AS NEEDED  . diltiazem (CARDIZEM CD) 240 MG 24 hr capsule TAKE ONE CAPSULE BY MOUTH ONE TIME DAILY  . esomeprazole (NEXIUM) 40 MG capsule TAKE 1 CAPSULE (40 MG TOTAL) BY MOUTH DAILY.  . ferrous sulfate 325 (65 FE) MG tablet Take 325 mg by mouth at bedtime.  Marland Kitchen ipratropium-albuterol (DUONEB) 0.5-2.5 (3) MG/3ML SOLN Take 3 mLs by nebulization every 6 (six) hours as needed. Dx:  Asthma 493.90  . montelukast (SINGULAIR) 10 MG tablet TAKE 1 TABLET (10 MG TOTAL) BY MOUTH AT BEDTIME.  . Multiple Vitamins-Minerals (PRESERVISION/LUTEIN PO) Take 1 capsule by mouth at bedtime.   . nitroGLYCERIN (NITROLINGUAL) 0.4 MG/SPRAY spray ONE SPRAY UNDER TONGUE AS NEED FOR CHESTPAIN. MAY REPEAT IN 5 MINUTES IF PAIN NOT RELIEVED CALL 911.  Marland Kitchen PROAIR HFA 108 (90 Base) MCG/ACT inhaler INHALE TWO PUFFS BY MOUTH 4 TIMES DAILY AS NEEDED  . simvastatin (ZOCOR) 10 MG tablet TAKE ONE TABLET BY MOUTH ONE TIME DAILY  . [EXPIRED] denosumab (PROLIA) 60 MG/ML SOSY injection Inject 60 mg into the skin once for  1 dose.  . [DISCONTINUED] albuterol (PROVENTIL) (2.5 MG/3ML) 0.083% nebulizer solution Take 3 mLs (2.5 mg total) by nebulization every 6 (six) hours as needed for wheezing or shortness of breath. Dx:  F81.017   No facility-administered encounter medications on file as of 01/13/2018.     Allergies (verified) Actonel [risedronate]; Fosamax [alendronate sodium]; Mevacor [lovastatin]; Miacalcin [calcitonin (salmon)]; and Mobic [meloxicam]   History: Past Medical History:  Diagnosis Date  . Anemia   . Asthma   . Brain tumor ([place])    1986  . Breast cancer ([place])    Right breast  . Cataract   . Colon polyps   . COPD (chronic obstructive pulmonary disease) ([place])   . Hiatal hernia   . Hyperlipidemia   . Leg edema   . Macular degeneration   . Osteoporosis   . Pneumonia   . PVD (posterior vitreous detachment)   . Right knee pain   . Shingles rash    Past Surgical History:  Procedure Laterality Date  . CATARACT EXTRACTION, BILATERAL    . COLONOSCOPY    . CRANIECTOMY / CRANIOTOMY FOR EXCISION OF BRAIN TUMOR     Left side brain  . MASTECTOMY Right   . PARTIAL HYSTERECTOMY    . POLYPECTOMY    . SKIN CANCER EXCISION Left    Under left eye   Family History  Problem Relation Age of Onset  . Heart disease Mother 25  Stroke   . Dementia Mother   . Congestive Heart Failure Mother   . Osteoporosis Mother   . Heart disease Sister   . Hypertension Sister   . Diabetes Sister   . Osteoporosis Sister   . Heart disease Brother   . Hyperlipidemia Brother   . Diabetes Brother   . Heart disease Sister   . Hypertension Sister   . Diabetes Sister   . Diabetes Sister   . Hypertension Sister   . Diabetes Sister   . Hypertension Sister   . Diabetes Brother   . Hyperlipidemia Brother   . Cancer Father        lung  . Heart attack Father   . Diabetes Brother   . Heart disease Brother   . Hyperlipidemia Brother   . Diabetes Brother   . Diabetes Son   . Colon cancer Neg Hx   .  Rectal cancer Neg Hx   . Stomach cancer Neg Hx   . Esophageal cancer Neg Hx    Social History   Socioeconomic History  . Marital status: Widowed    Spouse name: Not on file  . Number of children: 1  . Years of education: 61th  . Highest education level: 7th grade  Occupational History  . Occupation: retired  Scientific laboratory technician  . Financial resource strain: Not hard at all  . Food insecurity:    Worry: Never true    Inability: Never true  . Transportation needs:    Medical: No    Non-medical: No  Tobacco Use  . Smoking status: Former Smoker    Types: Cigarettes    Last attempt to quit: 02/09/1961    Years since quitting: 56.9  . Smokeless tobacco: Never Used  Substance and Sexual Activity  . Alcohol use: No    Alcohol/week: 0.0 oz  . Drug use: No  . Sexual activity: Never  Lifestyle  . Physical activity:    Days per week: 3 days    Minutes per session: 40 min  . Stress: Not on file  Relationships  . Social connections:    Talks on phone: Not on file    Gets together: Not on file    Attends religious service: Not on file    Active member of club or organization: Not on file    Attends meetings of clubs or organizations: Not on file    Relationship status: Not on file  Other Topics Concern  . Not on file  Social History Narrative   Son lives with patient.   Retired from farming.   Highest level of education:  7th    Tobacco Use No.  Clinical Intake:  Pre-visit preparation completed: No  Pain : 0-10 Pain Score: 4  Pain Type: Chronic pain Pain Location: Generalized(Hip, back, and leg) Pain Orientation: Right, Lower Pain Descriptors / Indicators: Aching, Burning Pain Onset: More than a month ago Pain Frequency: Constant Effect of Pain on Daily Activities: minimal-works through the pain     Nutritional Status: BMI 25 -29 Overweight Diabetes: No  How often do you need to have someone help you when you read instructions, pamphlets, or other written materials  from your doctor or pharmacy?: 1 - Never What is the last grade level you completed in school?: 7th  Interpreter Needed?: No  Information entered by :: Chong Sicilian, RN   Activities of Daily Living In your present state of health, do you have any difficulty performing the following activities: 01/13/2018  Hearing? N  Vision? Y  Comment macular degeneration  Difficulty concentrating or making decisions? N  Walking or climbing stairs? N  Comment No real trouble walking but does have a fear of falling  Dressing or bathing? N  Doing errands, shopping? N  Comment has son drive her but is able to run Immunologist and eating ? N  Using the Toilet? N  In the past six months, have you accidently leaked urine? Y  Comment has some trouble with light incontinence  Do you have problems with loss of bowel control? N  Managing your Medications? N  Managing your Finances? N  Housekeeping or managing your Housekeeping? N  Some recent data might be hidden     Immunizations and Health Maintenance Immunization History  Administered Date(s) Administered  . Pneumococcal Conjugate-13 06/21/2013  . Pneumococcal Polysaccharide-23 04/26/2005   There are no preventive care reminders to display for this patient.  Diet Eats 2 meals a day and drinks mostly water. May have a soft drink from time to time  Exercise Current Exercise Habits: Home exercise routine, Type of exercise: walking, Time (Minutes): 30, Frequency (Times/Week): 3, Weekly Exercise (Minutes/Week): 90, Intensity: Mild, Exercise limited by: Other - see comments(fear of falling)   Depression Screen PHQ 2/9 Scores 01/13/2018 10/08/2017 09/11/2017 05/25/2017 04/17/2017 01/23/2017 01/19/2017  PHQ - 2 Score 0 0 0 0 0 0 0     Fall Risk Fall Risk  01/13/2018 10/08/2017 09/11/2017 06/22/2017 05/25/2017  Falls in the past year? No No No No No  Number falls in past yr: - - - - -  Comment - - - - -  Injury with Fall? - - - - -  Comment -  - - - -  Risk Factor Category  - - - - -  Risk for fall due to : - - - - -  Risk for fall due to: Comment - - - - -  Follow up - - - - -    Safety Is the patient's home free of loose throw rugs in walkways, pet beds, electrical cords, etc?   yes      Grab bars in the bathroom? no      Walkin shower? no      Shower Seat? no      Handrails on the stairs?   yes      Adequate lighting?   no  Patient Care Team: Chipper Herb, MD as PCP - General (Family Medicine) Milus Banister, MD (Gastroenterology) Deeann Saint, MD as Consulting Physician (Hematology and Oncology) Sherlynn Stalls, MD as Consulting Physician (Ophthalmology) Satira Sark, MD as Consulting Physician (Cardiology) Okey Regal, OD as Consulting Physician (Optometry)   ER visit 09/08/17. No other hospitalizations, ER, visits, or surgeries  Objective:    Today's Vitals   01/13/18 1111  BP: 122/70  Pulse: 80  Weight: 165 lb (74.8 kg)  Height: 5\' 3"  (1.6 m)  PainSc: 4    Body mass index is 29.23 kg/m.  Advanced Directives 01/13/2018 09/08/2017 01/05/2017 11/14/2016 10/16/2016 12/10/2015 10/29/2015  Does Patient Have a Medical Advance Directive? No No No No No No No  Would patient like information on creating a medical advance directive? No - Patient declined - Yes (MAU/Ambulatory/Procedural Areas - Information given) No - Patient declined - Yes - Educational materials given No - patient declined information    Hearing/Vision  normal or No deficits noted during visit.  Cognitive Function: MMSE - Mini Mental State Exam 01/13/2018  01/05/2017 12/10/2015 10/18/2014  Orientation to time 5 5 5 5   Orientation to Place 5 5 5 5   Registration 3 3 3 3   Attention/ Calculation 4 5 5 5   Recall 2 2 3 3   Language- name 2 objects 2 2 2 2   Language- repeat 1 1 1 1   Language- follow 3 step command 3 1 3 3   Language- read & follow direction 1 1 1 1   Write a sentence 1 1 1 1   Copy design 0 1 1 1   Total score 27 27 30 30         Normal Cognitive Function Screening: Likely normal, no change from last year      Assessment:   This is a routine wellness examination for Krystal [name].    Plan:    Goals    . Exercise 150 min/wk Moderate Activity       Keep f/u with Chipper Herb, MD and any other specialty appointments you may have Continue current medications Move carefully to avoid falls. Use assistive devices like a can or walker if needed. Aim for at least 150 minutes of moderate activity a week. This can be done with chair exercises if necessary. Read or work on puzzles daily Stay connected with friends and family  Health Maintenance: Tdap Vaccine recommended: yes Zostavax (Shingles vaccine) recommended:no Prevnar or Pneumovax (pneumonia vaccines) recommended:no  Cancer Screenings: Lung: Low Dose CT Chest recommended if Age 35-80 years, 30 pack-year currently smoking OR have quit w/in 15years. Patient does not qualify. Colon cancer screening recommended: No Mammogram recommended:no Pap Smear recommended: no  Additional Screenings Hepatitis C Screening recommended: no Dexa Scan recommended: no Diabetic Eye Exam recommended: n/a  Orders Placed This Encounter  Procedures  . DG [facility] DEXA    Order Specific Question:   Reason for Exam (SYMPTOM  OR DIAGNOSIS REQUIRED)    Answer:   osteoporosis   Prolia ordered. Will call to schedule.   I have personally reviewed and noted the following in the patient's chart:   . Medical and social history . Use of alcohol, tobacco or illicit drugs  . Current medications and supplements . Functional ability and status . Nutritional status . Physical activity . Advanced directives . List of other physicians . Hospitalizations, surgeries, and ER visits in previous 12 months . Vitals . Screenings to include cognitive, depression, and falls . Referrals and appointments  In addition, I have reviewed and discussed with patient certain preventive protocols,  quality metrics, and best practice recommendations. A written personalized care plan for preventive services as well as general preventive health recommendations were provided to patient.     Chong Sicilian, RN   01/15/2018   I have reviewed and agree with the above AWV documentation.  Arrie Senate MD

## 2018-01-18 ENCOUNTER — Other Ambulatory Visit: Payer: Self-pay | Admitting: *Deleted

## 2018-01-18 NOTE — Progress Notes (Signed)
Patient is on Prolia and is due for her next injection soon. It was ordered form the specialty pharmacy when she was seen for her AWV. We will schedule her when the medication comes in. She is aware.

## 2018-01-25 ENCOUNTER — Telehealth: Payer: Self-pay | Admitting: Family Medicine

## 2018-01-25 NOTE — Telephone Encounter (Signed)
Patient is due on 01/30/18. Advised that medication was ordered at [facility] as discussed. The specialty pharmacy will contact her to review cost and get authorization to mail it to our office. She does not answer her phone often and may have missed their call. Explained that she has to talk with them and then they will call our office to schedule delivery. Once it has arrived we will contact her to schedule the injection. Patient stated understanding and said that she will try to give them a call tomorrow.

## 2018-01-26 ENCOUNTER — Telehealth: Payer: Self-pay | Admitting: *Deleted

## 2018-01-26 ENCOUNTER — Telehealth: Payer: Self-pay | Admitting: Family Medicine

## 2018-01-26 NOTE — Telephone Encounter (Signed)
Spoke with Davina at [facility] for Prolia was sent to [facility] on 01/13/2018

## 2018-01-26 NOTE — Telephone Encounter (Signed)
Patient talked to [facility] about her prolia shot

## 2018-01-29 ENCOUNTER — Ambulatory Visit (INDEPENDENT_AMBULATORY_CARE_PROVIDER_SITE_OTHER): Payer: Medicare Other | Admitting: *Deleted

## 2018-01-29 DIAGNOSIS — M810 Age-related osteoporosis without current pathological fracture: Secondary | ICD-10-CM | POA: Diagnosis not present

## 2018-01-29 MED ORDER — DENOSUMAB 60 MG/ML ~~LOC~~ SOSY
60.0000 mg | PREFILLED_SYRINGE | Freq: Once | SUBCUTANEOUS | Status: AC
  Administered 2018-01-29: 60 mg via SUBCUTANEOUS

## 2018-01-29 NOTE — Progress Notes (Signed)
Pt given Prolia inj Tolerated well 

## 2018-02-25 ENCOUNTER — Ambulatory Visit (INDEPENDENT_AMBULATORY_CARE_PROVIDER_SITE_OTHER): Payer: Medicare Other | Admitting: Family Medicine

## 2018-02-25 ENCOUNTER — Encounter: Payer: Self-pay | Admitting: Family Medicine

## 2018-02-25 VITALS — BP 116/61 | HR 76 | Temp 97.1°F | Ht 63.0 in | Wt 162.0 lb

## 2018-02-25 DIAGNOSIS — M25531 Pain in right wrist: Secondary | ICD-10-CM

## 2018-02-25 DIAGNOSIS — E559 Vitamin D deficiency, unspecified: Secondary | ICD-10-CM | POA: Diagnosis not present

## 2018-02-25 DIAGNOSIS — D509 Iron deficiency anemia, unspecified: Secondary | ICD-10-CM | POA: Diagnosis not present

## 2018-02-25 DIAGNOSIS — I700 Atherosclerosis of aorta: Secondary | ICD-10-CM | POA: Diagnosis not present

## 2018-02-25 DIAGNOSIS — R6889 Other general symptoms and signs: Secondary | ICD-10-CM | POA: Diagnosis not present

## 2018-02-25 DIAGNOSIS — I10 Essential (primary) hypertension: Secondary | ICD-10-CM | POA: Diagnosis not present

## 2018-02-25 DIAGNOSIS — E7800 Pure hypercholesterolemia, unspecified: Secondary | ICD-10-CM

## 2018-02-25 DIAGNOSIS — E782 Mixed hyperlipidemia: Secondary | ICD-10-CM | POA: Diagnosis not present

## 2018-02-25 DIAGNOSIS — M810 Age-related osteoporosis without current pathological fracture: Secondary | ICD-10-CM | POA: Diagnosis not present

## 2018-02-25 NOTE — Progress Notes (Signed)
Subjective:    Patient ID: Krystal [name], female    DOB: 1940/08/07, 77 y.o.   MRN: [id number]  HPI Pt here for follow up and management of chronic medical problems which includes hyperlipidemia, hypertension and anemia. She is taking medication regularly.  The patient is doing well overall but does complain of a right wrist cyst.  She will be given an FOBT or in fact already has one and it is at home and will return this.  She will get lab work today.  The patient is pleasant and doing well.  The area that she said was a cyst appears to be a bony prominence in the wrist like some calcium buildup.  She has had pain in the hand and wrist for a while but seems to now have moved up to the elbow.  She denies any chest pain or shortness of breath.  She denies any trouble with swallowing heartburn indigestion nausea vomiting diarrhea or blood in the stool.  She is passing her water well though frequently and this is worse at nighttime.  I encouraged her to use support hose and put these on the first thing with arising in the morning especially when the weather cools down more.     Patient Active Problem List   Diagnosis Date Noted  . Influenza with pneumonia 10/30/2015  . CAP (community acquired pneumonia) 10/29/2015  . Abdominal aortic atherosclerosis ([place]) 04/10/2015  . PSVT (paroxysmal supraventricular tachycardia) ([place]) 05/10/2014  . Macular degeneration, age related, nonexudative 08/24/2013  . History of right mastectomy 07/13/2013  . Anemia, iron deficiency 06/21/2013  . Allergic rhinitis 06/21/2013  . Chronic low back pain 06/21/2013  . Malignant neoplasm of female breast ([place]) 11/22/2007  . Mixed hyperlipidemia 11/22/2007  . Anxiety state 11/22/2007  . DEPRESSION 11/22/2007  . Asthma 11/22/2007  . GERD 11/22/2007  . GASTRITIS, WITH HEMORRHAGE 08/14/2007  . DUODENITIS, WITHOUT HEMORRHAGE 08/14/2007  . HIATAL HERNIA 08/14/2007  . 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    Outpatient Encounter Medications as of 02/25/2018  Medication Sig  . aspirin EC 81 MG tablet Take 81 mg by mouth at bedtime.  . Calcium Citrate-Vitamin D (CALCIUM CITRATE + D) 315-250 MG-UNIT TABS Take 2 tablets by mouth daily.  . cetirizine (ZYRTEC) 10 MG tablet Take 5-10 mg by mouth at bedtime.   . Cholecalciferol (VITAMIN D) 2000 UNITS CAPS Take 1 capsule by mouth at bedtime.  . diazepam (VALIUM) 5 MG tablet TAKE 1 TABLET EVERY 6 HOURS AS NEEDED  . diltiazem (CARDIZEM CD) 240 MG 24 hr capsule TAKE ONE CAPSULE BY MOUTH ONE TIME DAILY  . esomeprazole (NEXIUM) 40 MG capsule TAKE 1 CAPSULE (40 MG TOTAL) BY MOUTH DAILY.  . ferrous sulfate 325 (65 FE) MG tablet Take 325 mg by mouth at bedtime.  Marland Kitchen ipratropium-albuterol (DUONEB) 0.5-2.5 (3) MG/3ML SOLN Take 3 mLs by nebulization every 6 (six) hours as needed. Dx:  Asthma 493.90  . montelukast (SINGULAIR) 10 MG tablet TAKE 1 TABLET (10 MG TOTAL) BY MOUTH AT BEDTIME.  . Multiple Vitamins-Minerals (PRESERVISION/LUTEIN PO) Take 1 capsule by mouth at bedtime.   . nitroGLYCERIN (NITROLINGUAL) 0.4 MG/SPRAY spray ONE SPRAY UNDER TONGUE AS NEED FOR CHESTPAIN. MAY REPEAT IN 5 MINUTES IF PAIN NOT RELIEVED CALL 911.  Marland Kitchen PROAIR HFA 108 (90 Base) MCG/ACT inhaler INHALE TWO PUFFS BY MOUTH 4 TIMES DAILY AS NEEDED  . simvastatin (ZOCOR) 10 MG tablet TAKE ONE TABLET BY MOUTH ONE TIME DAILY   No facility-administered encounter medications on  file as of 02/25/2018.      Review of Systems  Constitutional: Negative.   HENT: Negative.   Eyes: Negative.   Respiratory: Negative.   Cardiovascular: Negative.   Gastrointestinal: Negative.   Endocrine: Negative.   Genitourinary: Negative.   Musculoskeletal: Negative.        Right wrist cyst   Skin: Negative.   Allergic/Immunologic: Negative.   Neurological: Negative.   Hematological: Negative.   Psychiatric/Behavioral: Negative.        Objective:   Physical Exam  Constitutional: She is oriented to person,  place, and time. She appears well-developed and well-nourished. No distress.  Patient is calm pleasant and relaxed  HENT:  Head: Normocephalic and atraumatic.  Right Ear: External ear normal.  Left Ear: External ear normal.  Nose: Nose normal.  Mouth/Throat: Oropharynx is clear and moist. No oropharyngeal exudate.  Eyes: Pupils are equal, round, and reactive to light. Conjunctivae and EOM are normal. Right eye exhibits no discharge. Left eye exhibits no discharge. No scleral icterus.  Neck: Normal range of motion. Neck supple. No thyromegaly present.  No bruits thyromegaly or anterior cervical adenopathy  Cardiovascular: Normal rate, regular rhythm, normal heart sounds and intact distal pulses.  No murmur heard. Heart is regular at 76/min  Pulmonary/Chest: Effort normal and breath sounds normal. She has no wheezes. She has no rales.  Lungs are clear anteriorly and posteriorly  Abdominal: Soft. Bowel sounds are normal. She exhibits no mass. There is tenderness.  Slight epigastric tenderness without enlargement of liver or spleen.  No bruits and no suprapubic tenderness  Musculoskeletal: Normal range of motion. She exhibits tenderness and deformity. She exhibits no edema.  Bony prominence right wrist with pain that radiates to the right elbow.  Lymphadenopathy:    She has no cervical adenopathy.  Neurological: She is alert and oriented to person, place, and time. She has normal reflexes. No cranial nerve deficit.  Skin: Skin is warm and dry. No rash noted.  Psychiatric: She has a normal mood and affect. Her behavior is normal. Judgment and thought content normal.  The patient's mood affect and behavior appeared to be normal.  Nursing note and vitals reviewed.   BP 116/61 (BP Location: Left Arm)   Pulse 76   Temp (!) 97.1 F (36.2 C) (Oral)   Ht _0  (1.6 m)   Wt 162 lb (73.5 kg)   BMI 28.70 kg/m        Assessment & Plan:  1. Pure hypercholesterolemia -Continue simvastatin  and as aggressive therapeutic lifestyle changes as possible pending results of lab work - Lipid panel  2. Vitamin D deficiency -Continue vitamin D replacement pending results of lab work - VITAMIN D 25 Hydroxy (Vit-D Deficiency, Fractures)  3. Iron deficiency anemia, unspecified iron deficiency anemia type -Continue supplemental iron pending results of CBC and hemoglobin - Anemia Profile B  4. Essential hypertension -Blood pressure is good today and she will continue with current treatment - BMP8+EGFR - Hepatic function panel  5. Thoracic aortic atherosclerosis ([place]) -Continue with aggressive therapeutic lifestyle changes and statin therapy pending results of lab work - Lipid panel  6. Abdominal aortic atherosclerosis ([place]) -Continue with diet and statin drug and therapeutic lifestyle changes - Lipid panel  7. Right wrist pain -Bony prominence right wrist radiating to right elbow. -Referral to orthopedist for further evaluation and management  Patient Instructions  Medicare Annual Wellness Visit  [facility] and the medical providers at [facility] strive to bring you the best medical care.  In doing so we not only want to address your current medical conditions and concerns but also to detect new conditions early and prevent illness, disease and health-related problems.    Medicare offers a yearly Wellness Visit which allows our clinical staff to assess your need for preventative services including immunizations, lifestyle education, counseling to decrease risk of preventable diseases and screening for fall risk and other medical concerns.    This visit is provided free of charge (no copay) for all Medicare recipients. The clinical pharmacists at [facility] have begun to conduct these Wellness Visits which will also include a thorough review of all your medications.    As you primary medical provider recommend  that you make an appointment for your Annual Wellness Visit if you have not done so already this year.  You may set up this appointment before you leave today or you may call back ([phone]) and schedule an appointment.  Please make sure when you call that you mention that you are scheduling your Annual Wellness Visit with the clinical pharmacist so that the appointment may be made for the proper length of time.     Continue current medications. Continue good therapeutic lifestyle changes which include good diet and exercise. Fall precautions discussed with patient. If an FOBT was given today- please return it to our front desk. If you are over 47 years old - you may need Prevnar 73 or the adult Pneumonia vaccine.  **Flu shots are available--- please call and schedule a FLU-CLINIC appointment**  After your visit with [place] today you will receive a survey in the mail or online from [employer] regarding your care with [place]. Please take a moment to fill this out. Your feedback is very important to [place] as you can help [place] better understand your patient needs as well as improve your experience and satisfaction. WE CARE ABOUT YOU!!!   Get repeat mammogram as planned at the appropriate time Check breast and chest wall regularly Watch diet closely and stay as active as possible physically and drink plenty of water Return the FOBT Get eye exam and have MD send [place] a hard copy of this report We will schedule an appointment with the extremity specialist with [facility] orthopedics for further evaluation of the bony prominence in the right wrist.  And the pain that is associated with this.  Arrie Senate MD

## 2018-02-25 NOTE — Patient Instructions (Addendum)
Medicare Annual Wellness Visit  [facility] and the medical providers at [facility] strive to bring you the best medical care.  In doing so we not only want to address your current medical conditions and concerns but also to detect new conditions early and prevent illness, disease and health-related problems.    Medicare offers a yearly Wellness Visit which allows our clinical staff to assess your need for preventative services including immunizations, lifestyle education, counseling to decrease risk of preventable diseases and screening for fall risk and other medical concerns.    This visit is provided free of charge (no copay) for all Medicare recipients. The clinical pharmacists at [facility] have begun to conduct these Wellness Visits which will also include a thorough review of all your medications.    As you primary medical provider recommend that you make an appointment for your Annual Wellness Visit if you have not done so already this year.  You may set up this appointment before you leave today or you may call back ([phone]) and schedule an appointment.  Please make sure when you call that you mention that you are scheduling your Annual Wellness Visit with the clinical pharmacist so that the appointment may be made for the proper length of time.     Continue current medications. Continue good therapeutic lifestyle changes which include good diet and exercise. Fall precautions discussed with patient. If an FOBT was given today- please return it to our front desk. If you are over 85 years old - you may need Prevnar 74 or the adult Pneumonia vaccine.  **Flu shots are available--- please call and schedule a FLU-CLINIC appointment**  After your visit with [place] today you will receive a survey in the mail or online from [employer] regarding your care with [place]. Please take a moment to fill this out. Your feedback is very  important to [place] as you can help [place] better understand your patient needs as well as improve your experience and satisfaction. WE CARE ABOUT YOU!!!   Get repeat mammogram as planned at the appropriate time Check breast and chest wall regularly Watch diet closely and stay as active as possible physically and drink plenty of water Return the FOBT Get eye exam and have MD send [place] a hard copy of this report We will schedule an appointment with the extremity specialist with [facility] orthopedics for further evaluation of the bony prominence in the right wrist.  And the pain that is associated with this.

## 2018-02-26 LAB — ANEMIA PROFILE B
BASOS: 1 %
Basophils Absolute: 0 10*3/uL (ref 0.0–0.2)
EOS (ABSOLUTE): 0.2 10*3/uL (ref 0.0–0.4)
Eos: 4 %
Ferritin: 187 ng/mL — ABNORMAL HIGH (ref 15–150)
Folate: >20 ng/mL (ref >3.0)
HEMATOCRIT: 41.7 % (ref 34.0–46.6)
Hemoglobin: 13.6 g/dL (ref 11.1–15.9)
IMMATURE GRANS (ABS): 0 10*3/uL (ref 0.0–0.1)
IMMATURE GRANULOCYTES: 0 %
IRON SATURATION: 41 % (ref 15–55)
Iron: 122 ug/dL (ref 27–139)
LYMPHS: 23 %
Lymphocytes Absolute: 1.3 10*3/uL (ref 0.7–3.1)
MCH: 30 pg (ref 26.6–33.0)
MCHC: 32.6 g/dL (ref 31.5–35.7)
MCV: 92 fL (ref 79–97)
MONOCYTES: 8 %
Monocytes Absolute: 0.5 10*3/uL (ref 0.1–0.9)
NEUTROS PCT: 64 %
Neutrophils Absolute: 3.7 10*3/uL (ref 1.4–7.0)
Platelets: 160 10*3/uL (ref 150–450)
RBC: 4.53 x10E6/uL (ref 3.77–5.28)
RDW: 12.1 % — AB (ref 12.3–15.4)
RETIC CT PCT: 1.1 % (ref 0.6–2.6)
Total Iron Binding Capacity: 295 ug/dL (ref 250–450)
UIBC: 173 ug/dL (ref 118–369)
Vitamin B-12: 256 pg/mL (ref 232–1245)
WBC: 5.7 10*3/uL (ref 3.4–10.8)

## 2018-02-26 LAB — BMP8+EGFR
BUN/Creatinine Ratio: 19 (ref 12–28)
BUN: 16 mg/dL (ref 8–27)
CALCIUM: 9.6 mg/dL (ref 8.7–10.3)
CO2: 25 mmol/L (ref 20–29)
CREATININE: 0.84 mg/dL (ref 0.57–1.00)
Chloride: 104 mmol/L (ref 96–106)
GFR calc Af Amer: 77 mL/min/{1.73_m2} (ref >59)
GFR, EST NON AFRICAN AMERICAN: 67 mL/min/{1.73_m2} (ref >59)
Glucose: 98 mg/dL (ref 65–99)
POTASSIUM: 4.1 mmol/L (ref 3.5–5.2)
Sodium: 143 mmol/L (ref 134–144)

## 2018-02-26 LAB — HEPATIC FUNCTION PANEL
ALT: 6 IU/L (ref 0–32)
AST: 22 IU/L (ref 0–40)
Albumin: 4.2 g/dL (ref 3.5–4.8)
Alkaline Phosphatase: 41 IU/L (ref 39–117)
BILIRUBIN TOTAL: 0.4 mg/dL (ref 0.0–1.2)
Bilirubin, Direct: 0.14 mg/dL (ref 0.00–0.40)
Total Protein: 6.3 g/dL (ref 6.0–8.5)

## 2018-02-26 LAB — LIPID PANEL
CHOL/HDL RATIO: 2.5 ratio (ref 0.0–4.4)
Cholesterol, Total: 154 mg/dL (ref 100–199)
HDL: 61 mg/dL (ref >39)
LDL Calculated: 72 mg/dL (ref 0–99)
TRIGLYCERIDES: 103 mg/dL (ref 0–149)
VLDL Cholesterol Cal: 21 mg/dL (ref 5–40)

## 2018-02-26 LAB — VITAMIN D 25 HYDROXY (VIT D DEFICIENCY, FRACTURES): Vit D, 25-Hydroxy: 43.5 ng/mL (ref 30.0–100.0)

## 2018-03-01 ENCOUNTER — Encounter: Payer: Self-pay | Admitting: *Deleted

## 2018-03-06 ENCOUNTER — Encounter: Payer: Self-pay | Admitting: Family Medicine

## 2018-03-06 ENCOUNTER — Ambulatory Visit (INDEPENDENT_AMBULATORY_CARE_PROVIDER_SITE_OTHER): Payer: Medicare Other | Admitting: Family Medicine

## 2018-03-06 VITALS — BP 111/63 | HR 82 | Temp 97.2°F | Ht 63.0 in | Wt 164.0 lb

## 2018-03-06 DIAGNOSIS — J441 Chronic obstructive pulmonary disease with (acute) exacerbation: Secondary | ICD-10-CM | POA: Diagnosis not present

## 2018-03-06 MED ORDER — CEFDINIR 300 MG PO CAPS: 300.0000 mg | ORAL_CAPSULE | Freq: Two times a day (BID) | ORAL | 0 refills | Status: DC

## 2018-03-06 MED ORDER — PREDNISONE 20 MG PO TABS: ORAL_TABLET | ORAL | 0 refills | Status: DC

## 2018-03-06 NOTE — Progress Notes (Signed)
BP 111/63 (BP Location: Left Arm)   Pulse 82   Temp (!) 97.2 F (36.2 C) (Oral)   Ht 5\' 3"  (1.6 m)   Wt 164 lb (74.4 kg)   SpO2 94%   BMI 29.05 kg/m    Subjective:    Patient ID: Krystal [name], female    DOB: 04-06-1941, 77 y.o.   MRN: [id number]  HPI: Krystal [name] is a 77 y.o. female presenting on 03/06/2018 for Cough   HPI Cough and congestion and wheezing Patient is coming in complaining of cough and congestion and wheezing that she is been having go on for the past 1 day.  She has been using her DuoNeb and her albuterol inhalers and they do help some but she still feels like her cough is getting worse and it kept her up last night.  Patient does have known COPD and chronic bronchitis which she gets recurrently and she says she usually gets like this about once a year where she needs to come in for treatment.  She denies any fevers or chills or shortness of breath but just mainly has the wheezing and the coughing spells.  Her cough is been mostly dry nonproductive at this point.  She denies any sick contacts that she knows of.  Relevant past medical, surgical, family and social history reviewed and updated as indicated. Interim medical history since our last visit reviewed. Allergies and medications reviewed and updated.  Review of Systems  Constitutional: Negative for chills and fever.  HENT: Positive for congestion, postnasal drip, rhinorrhea and sore throat. Negative for ear discharge, ear pain, sinus pressure and sneezing.   Eyes: Negative for pain, redness and visual disturbance.  Respiratory: Positive for cough and wheezing. Negative for chest tightness and shortness of breath.   Cardiovascular: Negative for chest pain and leg swelling.  Genitourinary: Negative for difficulty urinating and dysuria.  Musculoskeletal: Negative for back pain and gait problem.  Skin: Negative for rash.  Neurological: Negative for light-headedness and headaches.    Psychiatric/Behavioral: Negative for agitation and behavioral problems.  All other systems reviewed and are negative.   Per HPI unless specifically indicated above        Objective:    BP 111/63 (BP Location: Left Arm)   Pulse 82   Temp (!) 97.2 F (36.2 C) (Oral)   Ht 5\' 3"  (1.6 m)   Wt 164 lb (74.4 kg)   SpO2 94%   BMI 29.05 kg/m   Wt Readings from Last 3 Encounters:  03/06/18 164 lb (74.4 kg)  02/25/18 162 lb (73.5 kg)  01/13/18 165 lb (74.8 kg)    Physical Exam  Constitutional: She is oriented to person, place, and time. She appears well-developed and well-nourished. No distress.  HENT:  Right Ear: Tympanic membrane, external ear and ear canal normal.  Left Ear: Tympanic membrane, external ear and ear canal normal.  Nose: Mucosal edema present. No rhinorrhea. No epistaxis. Right sinus exhibits no maxillary sinus tenderness and no frontal sinus tenderness. Left sinus exhibits no maxillary sinus tenderness and no frontal sinus tenderness.  Mouth/Throat: Uvula is midline and mucous membranes are normal. Posterior oropharyngeal edema and posterior oropharyngeal erythema present. No oropharyngeal exudate or tonsillar abscesses.  Eyes: Conjunctivae are normal.  Cardiovascular: Normal rate, regular rhythm, normal heart sounds and intact distal pulses.  No murmur heard. Pulmonary/Chest: Effort normal. No respiratory distress. She has no wheezes. She has rhonchi.  Musculoskeletal: Normal range of motion. She exhibits no edema or tenderness.  Neurological: She is alert and oriented to person, place, and time. Coordination normal.  Skin: Skin is warm and dry. No rash noted. She is not diaphoretic.  Psychiatric: She has a normal mood and affect. Her behavior is normal.  Vitals reviewed.       Assessment & Plan:   Problem List Items Addressed This Visit    None    Visit Diagnoses    COPD exacerbation ([place])    -  Primary   Relevant Medications   cefdinir (OMNICEF) 300 MG  capsule   predniSONE (DELTASONE) 20 MG tablet     We will treat like COPD exacerbation based on patient's history.  Continue albuterol and DuoNeb's as needed.  Follow up plan: Return if symptoms worsen or fail to improve.  Counseling provided for all of the vaccine components No orders of the defined types were placed in this encounter.   Caryl Pina, MD [facility] 03/06/2018, 10:54 AM

## 2018-03-09 ENCOUNTER — Telehealth: Payer: Self-pay

## 2018-03-09 DIAGNOSIS — M25531 Pain in right wrist: Secondary | ICD-10-CM

## 2018-03-09 NOTE — Telephone Encounter (Signed)
Please arrange for patient to see 1 of the hand specialists with [facility] orthopedics

## 2018-03-09 NOTE — Telephone Encounter (Signed)
Patient said you were suppose to put in a referral for [facility]  Hand pain

## 2018-03-10 NOTE — Telephone Encounter (Signed)
Referral entered in Epic.  ° °

## 2018-03-10 NOTE — Addendum Note (Signed)
Addended by: Marin Olp on: 03/10/2018 01:21 PM   Modules accepted: Orders

## 2018-03-25 ENCOUNTER — Encounter (INDEPENDENT_AMBULATORY_CARE_PROVIDER_SITE_OTHER): Payer: Self-pay | Admitting: Orthopedic Surgery

## 2018-03-25 ENCOUNTER — Ambulatory Visit (INDEPENDENT_AMBULATORY_CARE_PROVIDER_SITE_OTHER): Payer: Medicare Other | Admitting: Orthopedic Surgery

## 2018-03-25 VITALS — Ht 63.0 in | Wt 164.0 lb

## 2018-03-25 DIAGNOSIS — G5601 Carpal tunnel syndrome, right upper limb: Secondary | ICD-10-CM | POA: Diagnosis not present

## 2018-03-25 NOTE — Progress Notes (Signed)
Office Visit Note   Patient: Krystal [name]           Date of Birth: [birth year]           MRN: [id number] Visit Date: 03/25/2018              Requested by: Chipper Herb, MD [address] [place], [place] [address] PCP: Chipper Herb, MD  Chief Complaint  Patient presents with  . Right Hand - Numbness  . Right Wrist - Numbness      HPI: Patient is a 77 year old woman who presents complaining of chronic numbness and tingling and weakness in her right hand.  She states that numbness and tingling wakes her up at night she has to shake her hand and fingers at night.  Patient states that the pain is radiating up into her forearm circumferential.  Patient states she had nerve conduction studies performed and she was told that this was coming from her neck she states she never returned to that physician.  Assessment & Plan: Visit Diagnoses:  1. Carpal tunnel syndrome of right wrist     Plan: Patient will be placed in a Velcro wrist splint to be worn at night.  Discussed that if this does not provide enough relief of her symptoms we could call in a low-dose prednisone to be taken with breakfast.  Patient states that she has taken prednisone in the past for respiratory disorders.  Follow-Up Instructions: Return in about 4 weeks (around 04/22/2018).   Ortho Exam  Patient is alert, oriented, no adenopathy, well-dressed, normal affect, normal respiratory effort. Check examination patient has a positive Phalen's positive Tinel's sign.  She has thenar wasting of the right wrist compared to the left.  She has decreased grip strength in the right hand compared to the left.  Subjectively she has tingling in the median nerve distribution.  Imaging: No results found. No images are attached to the encounter.  Labs: Lab Results  Component Value Date   ESRSEDRATE 17 06/22/2017   CRP 0.2 (L) 06/22/2017   REPTSTATUS 11/03/2015 FINAL 10/29/2015   CULT NO GROWTH 5 DAYS 10/29/2015      Lab Results  Component Value Date   ALBUMIN 4.2 02/25/2018   ALBUMIN 4.4 10/08/2017   ALBUMIN 4.1 05/25/2017    Body mass index is 29.05 kg/m.  Orders:  No orders of the defined types were placed in this encounter.  No orders of the defined types were placed in this encounter.    Procedures: No procedures performed  Clinical Data: No additional findings.  ROS:  All other systems negative, except as noted in the HPI. Review of Systems  Objective: Vital Signs: Ht 5\' 3"  (1.6 m)   Wt 164 lb (74.4 kg)   BMI 29.05 kg/m   Specialty Comments:  No specialty comments available.  PMFS History: Patient Active Problem List   Diagnosis Date Noted  . Carpal tunnel syndrome of right wrist 03/25/2018  . Influenza with pneumonia 10/30/2015  . CAP (community acquired pneumonia) 10/29/2015  . Abdominal aortic atherosclerosis ([place]) 04/10/2015  . PSVT (paroxysmal supraventricular tachycardia) ([place]) 05/10/2014  . Macular degeneration, age related, nonexudative 08/24/2013  . History of right mastectomy 07/13/2013  . Anemia, iron deficiency 06/21/2013  . Allergic rhinitis 06/21/2013  . Chronic low back pain 06/21/2013  . Malignant neoplasm of female breast ([place]) 11/22/2007  . Mixed hyperlipidemia 11/22/2007  . Anxiety state 11/22/2007  . DEPRESSION 11/22/2007  . Asthma 11/22/2007  . GERD  11/22/2007  . GASTRITIS, WITH HEMORRHAGE 08/14/2007  . DUODENITIS, WITHOUT HEMORRHAGE 08/14/2007  . HIATAL HERNIA 08/14/2007  . 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   Past Medical History:  Diagnosis Date  . Anemia   . Asthma   . Brain tumor ([place])    1986  . Breast cancer ([place])    Right breast  . Cataract   . Colon polyps   . COPD (chronic obstructive pulmonary disease) ([place])   . Hiatal hernia   . Hyperlipidemia   . Leg edema   . Macular degeneration   . Osteoporosis   . Pneumonia   . PVD (posterior vitreous detachment)   . Right knee pain   . Shingles rash     Family  History  Problem Relation Age of Onset  . Heart disease Mother 80       Stroke   . Dementia Mother   . Congestive Heart Failure Mother   . Osteoporosis Mother   . Heart disease Sister   . Hypertension Sister   . Diabetes Sister   . Osteoporosis Sister   . Heart disease Brother   . Hyperlipidemia Brother   . Diabetes Brother   . Heart disease Sister   . Hypertension Sister   . Diabetes Sister   . Diabetes Sister   . Hypertension Sister   . Diabetes Sister   . Hypertension Sister   . Diabetes Brother   . Hyperlipidemia Brother   . Cancer Father        lung  . Heart attack Father   . Diabetes Brother   . Heart disease Brother   . Hyperlipidemia Brother   . Diabetes Brother   . Diabetes Son   . Colon cancer Neg Hx   . Rectal cancer Neg Hx   . Stomach cancer Neg Hx   . Esophageal cancer Neg Hx     Past Surgical History:  Procedure Laterality Date  . CATARACT EXTRACTION, BILATERAL    . COLONOSCOPY    . CRANIECTOMY / CRANIOTOMY FOR EXCISION OF BRAIN TUMOR     Left side brain  . MASTECTOMY Right   . PARTIAL HYSTERECTOMY    . POLYPECTOMY    . SKIN CANCER EXCISION Left    Under left eye   Social History   Occupational History  . Occupation: retired  Tobacco Use  . Smoking status: Former Smoker    Types: Cigarettes    Last attempt to quit: 02/09/1961    Years since quitting: 57.1  . Smokeless tobacco: Never Used  Substance and Sexual Activity  . Alcohol use: No    Alcohol/week: 0.0 standard drinks  . Drug use: No  . Sexual activity: Never

## 2018-04-21 DIAGNOSIS — H43392 Other vitreous opacities, left eye: Secondary | ICD-10-CM | POA: Diagnosis not present

## 2018-04-21 DIAGNOSIS — H43813 Vitreous degeneration, bilateral: Secondary | ICD-10-CM | POA: Diagnosis not present

## 2018-04-21 DIAGNOSIS — H353124 Nonexudative age-related macular degeneration, left eye, advanced atrophic with subfoveal involvement: Secondary | ICD-10-CM | POA: Diagnosis not present

## 2018-04-21 DIAGNOSIS — H353212 Exudative age-related macular degeneration, right eye, with inactive choroidal neovascularization: Secondary | ICD-10-CM | POA: Diagnosis not present

## 2018-04-26 ENCOUNTER — Encounter (INDEPENDENT_AMBULATORY_CARE_PROVIDER_SITE_OTHER): Payer: Self-pay | Admitting: Orthopedic Surgery

## 2018-04-26 ENCOUNTER — Ambulatory Visit (INDEPENDENT_AMBULATORY_CARE_PROVIDER_SITE_OTHER): Payer: Medicare Other | Admitting: Orthopedic Surgery

## 2018-04-26 DIAGNOSIS — G5601 Carpal tunnel syndrome, right upper limb: Secondary | ICD-10-CM

## 2018-04-26 NOTE — Progress Notes (Signed)
Office Visit Note   Patient: Krystal [name]           Date of Birth: Aug 09, 1940           MRN: [id number] Visit Date: 04/26/2018              Requested by: Chipper Herb, MD [address] [place], [place] [address] PCP: Chipper Herb, MD  Chief Complaint  Patient presents with  . Right Wrist - Follow-up, Pain      HPI: Patient is a 77 year old woman who presents complaining of tingling and weakness in her right hand she states the night brace does help some but she states she has pain all the time she is left-hand-dominant she has used prednisone for respiratory infection and this did not improve her symptoms she had nerve conduction studies performed last year that were suggestive of a neck pathology.  Patient states she has never had any neck pain is never had any radicular symptoms.  Assessment & Plan: Visit Diagnoses:  1. Carpal tunnel syndrome of right wrist     Plan: We will obtain nerve conduction studies for the right wrist.  Anticipate she could benefit from carpal tunnel release.  Follow-Up Instructions: Return in about 1 week (around 05/03/2018).   Ortho Exam  Patient is alert, oriented, no adenopathy, well-dressed, normal affect, normal respiratory effort. Examination patient has no tenderness to palpation around the neck thoracic outlet is nontender to palpation.  The elbow is nontender to palpation no tenderness to palpation of the ulnar nerve or radial nerve at the elbow.  Phalen's and Tinel's test are positive and reproduced hand pain.  She has atrophy of the thenar and hyperthenar eminence of the right wrist compared to the left.  She is weakness in the right grip strength compared to left.  She has intact radial ulnar and median nerve function.  Imaging: No results found. No images are attached to the encounter.  Labs: Lab Results  Component Value Date   ESRSEDRATE 17 06/22/2017   CRP 0.2 (L) 06/22/2017   REPTSTATUS 11/03/2015 FINAL 10/29/2015   CULT NO GROWTH 5 DAYS 10/29/2015     Lab Results  Component Value Date   ALBUMIN 4.2 02/25/2018   ALBUMIN 4.4 10/08/2017   ALBUMIN 4.1 05/25/2017    There is no height or weight on file to calculate BMI.  Orders:  Orders Placed This Encounter  Procedures  . Ambulatory referral to Physical Medicine Rehab   No orders of the defined types were placed in this encounter.    Procedures: No procedures performed  Clinical Data: No additional findings.  ROS:  All other systems negative, except as noted in the HPI. Review of Systems  Objective: Vital Signs: There were no vitals taken for this visit.  Specialty Comments:  No specialty comments available.  PMFS History: Patient Active Problem List   Diagnosis Date Noted  . Carpal tunnel syndrome of right wrist 03/25/2018  . Influenza with pneumonia 10/30/2015  . CAP (community acquired pneumonia) 10/29/2015  . Abdominal aortic atherosclerosis ([place]) 04/10/2015  . PSVT (paroxysmal supraventricular tachycardia) ([place]) 05/10/2014  . Macular degeneration, age related, nonexudative 08/24/2013  . History of right mastectomy 07/13/2013  . Anemia, iron deficiency 06/21/2013  . Allergic rhinitis 06/21/2013  . Chronic low back pain 06/21/2013  . Malignant neoplasm of female breast ([place]) 11/22/2007  . Mixed hyperlipidemia 11/22/2007  . Anxiety state 11/22/2007  . DEPRESSION 11/22/2007  . Asthma 11/22/2007  . GERD 11/22/2007  .  GASTRITIS, WITH HEMORRHAGE 08/14/2007  . DUODENITIS, WITHOUT HEMORRHAGE 08/14/2007  . HIATAL HERNIA 08/14/2007  . 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   Past Medical History:  Diagnosis Date  . Anemia   . Asthma   . Brain tumor ([place])    1986  . Breast cancer ([place])    Right breast  . Cataract   . Colon polyps   . COPD (chronic obstructive pulmonary disease) ([place])   . Hiatal hernia   . Hyperlipidemia   . Leg edema   . Macular degeneration   . Osteoporosis   . Pneumonia   . PVD (posterior  vitreous detachment)   . Right knee pain   . Shingles rash     Family History  Problem Relation Age of Onset  . Heart disease Mother 50       Stroke   . Dementia Mother   . Congestive Heart Failure Mother   . Osteoporosis Mother   . Heart disease Sister   . Hypertension Sister   . Diabetes Sister   . Osteoporosis Sister   . Heart disease Brother   . Hyperlipidemia Brother   . Diabetes Brother   . Heart disease Sister   . Hypertension Sister   . Diabetes Sister   . Diabetes Sister   . Hypertension Sister   . Diabetes Sister   . Hypertension Sister   . Diabetes Brother   . Hyperlipidemia Brother   . Cancer Father        lung  . Heart attack Father   . Diabetes Brother   . Heart disease Brother   . Hyperlipidemia Brother   . Diabetes Brother   . Diabetes Son   . Colon cancer Neg Hx   . Rectal cancer Neg Hx   . Stomach cancer Neg Hx   . Esophageal cancer Neg Hx     Past Surgical History:  Procedure Laterality Date  . CATARACT EXTRACTION, BILATERAL    . COLONOSCOPY    . CRANIECTOMY / CRANIOTOMY FOR EXCISION OF BRAIN TUMOR     Left side brain  . MASTECTOMY Right   . PARTIAL HYSTERECTOMY    . POLYPECTOMY    . SKIN CANCER EXCISION Left    Under left eye   Social History   Occupational History  . Occupation: retired  Tobacco Use  . Smoking status: Former Smoker    Types: Cigarettes    Last attempt to quit: 02/09/1961    Years since quitting: 57.2  . Smokeless tobacco: Never Used  Substance and Sexual Activity  . Alcohol use: No    Alcohol/week: 0.0 standard drinks  . Drug use: No  . Sexual activity: Never

## 2018-05-07 ENCOUNTER — Encounter: Payer: Self-pay | Admitting: *Deleted

## 2018-05-25 ENCOUNTER — Ambulatory Visit (INDEPENDENT_AMBULATORY_CARE_PROVIDER_SITE_OTHER): Payer: Medicare Other | Admitting: Physical Medicine and Rehabilitation

## 2018-05-25 ENCOUNTER — Encounter (INDEPENDENT_AMBULATORY_CARE_PROVIDER_SITE_OTHER): Payer: Self-pay | Admitting: Physical Medicine and Rehabilitation

## 2018-05-25 DIAGNOSIS — R202 Paresthesia of skin: Secondary | ICD-10-CM | POA: Diagnosis not present

## 2018-05-25 NOTE — Progress Notes (Signed)
.  Numeric Pain Rating Scale and Functional Assessment Average Pain 6   In the last MONTH (on 0-10 scale) has pain interfered with the following?  1. General activity like being  able to carry out your everyday physical activities such as walking, climbing stairs, carrying groceries, or moving a chair?  Rating(7)   .  

## 2018-05-29 ENCOUNTER — Other Ambulatory Visit: Payer: Self-pay | Admitting: Family Medicine

## 2018-05-29 ENCOUNTER — Other Ambulatory Visit: Payer: Self-pay | Admitting: Cardiology

## 2018-05-30 ENCOUNTER — Other Ambulatory Visit: Payer: Self-pay | Admitting: Family Medicine

## 2018-05-31 ENCOUNTER — Encounter (INDEPENDENT_AMBULATORY_CARE_PROVIDER_SITE_OTHER): Payer: Self-pay | Admitting: Orthopedic Surgery

## 2018-05-31 ENCOUNTER — Ambulatory Visit (INDEPENDENT_AMBULATORY_CARE_PROVIDER_SITE_OTHER): Payer: Medicare Other | Admitting: Orthopedic Surgery

## 2018-05-31 VITALS — Ht 63.0 in | Wt 164.0 lb

## 2018-05-31 DIAGNOSIS — G5601 Carpal tunnel syndrome, right upper limb: Secondary | ICD-10-CM

## 2018-05-31 NOTE — Progress Notes (Signed)
Office Visit Note   Patient: Krystal [name]           Date of Birth: 29-Dec-1940           MRN: [id number] Visit Date: 05/31/2018              Requested by: Chipper Herb, MD [address] [place], [place] [address] PCP: Chipper Herb, MD  Chief Complaint  Patient presents with  . Right Wrist - Follow-up    Post NCS/EMG with Dr. Ernestina Patches      HPI: Patient is a 77 year old woman who presents status post nerve conduction studies of the right upper extremity.  Patient states that her grip strength is getting worse in the right hand she complains of burning pain into her fingers.  Assessment & Plan: Visit Diagnoses:  1. Carpal tunnel syndrome of right wrist     Plan: We will plan for open carpal tunnel release at the surgical center on a Tuesday.  Discussed risks of infection neurovascular injury persistent pain need for additional surgery.  Patient states she understands we will follow-up in the office 1 week she will leave the dressing in place until follow-up.  Follow-Up Instructions: Return in about 1 week (around 06/07/2018).   Ortho Exam  Patient is alert, oriented, no adenopathy, well-dressed, normal affect, normal respiratory effort. Examination patient has significant thenar wasting in the right hand compared to the left.  She has decreased grip strength in the right hand compared to left and she has pain to palpation over the transverse carpal ligament and has pain with flexion of the wrist.  By report her nerve conduction studies were positive for carpal tunnel syndrome.  Imaging: No results found. No images are attached to the encounter.  Labs: Lab Results  Component Value Date   ESRSEDRATE 17 06/22/2017   CRP 0.2 (L) 06/22/2017   REPTSTATUS 11/03/2015 FINAL 10/29/2015   CULT NO GROWTH 5 DAYS 10/29/2015     Lab Results  Component Value Date   ALBUMIN 4.2 02/25/2018   ALBUMIN 4.4 10/08/2017   ALBUMIN 4.1 05/25/2017    Body mass index is 29.05  kg/m.  Orders:  No orders of the defined types were placed in this encounter.  No orders of the defined types were placed in this encounter.    Procedures: No procedures performed  Clinical Data: No additional findings.  ROS:  All other systems negative, except as noted in the HPI. Review of Systems  Objective: Vital Signs: Ht 5\' 3"  (1.6 m)   Wt 164 lb (74.4 kg)   BMI 29.05 kg/m   Specialty Comments:  No specialty comments available.  PMFS History: Patient Active Problem List   Diagnosis Date Noted  . Carpal tunnel syndrome of right wrist 03/25/2018  . Influenza with pneumonia 10/30/2015  . CAP (community acquired pneumonia) 10/29/2015  . Abdominal aortic atherosclerosis ([place]) 04/10/2015  . PSVT (paroxysmal supraventricular tachycardia) ([place]) 05/10/2014  . Macular degeneration, age related, nonexudative 08/24/2013  . History of right mastectomy 07/13/2013  . Anemia, iron deficiency 06/21/2013  . Allergic rhinitis 06/21/2013  . Chronic low back pain 06/21/2013  . Malignant neoplasm of female breast ([place]) 11/22/2007  . Mixed hyperlipidemia 11/22/2007  . Anxiety state 11/22/2007  . DEPRESSION 11/22/2007  . Asthma 11/22/2007  . GERD 11/22/2007  . GASTRITIS, WITH HEMORRHAGE 08/14/2007  . DUODENITIS, WITHOUT HEMORRHAGE 08/14/2007  . HIATAL HERNIA 08/14/2007  . 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   Past Medical History:  Diagnosis Date  .  Anemia   . Asthma   . Brain tumor ([place])    1986  . Breast cancer ([place])    Right breast  . Cataract   . Colon polyps   . COPD (chronic obstructive pulmonary disease) ([place])   . Hiatal hernia   . Hyperlipidemia   . Leg edema   . Macular degeneration   . Osteoporosis   . Pneumonia   . PVD (posterior vitreous detachment)   . Right knee pain   . Shingles rash     Family History  Problem Relation Age of Onset  . Heart disease Mother 59       Stroke   . Dementia Mother   . Congestive Heart Failure Mother   .  Osteoporosis Mother   . Heart disease Sister   . Hypertension Sister   . Diabetes Sister   . Osteoporosis Sister   . Heart disease Brother   . Hyperlipidemia Brother   . Diabetes Brother   . Heart disease Sister   . Hypertension Sister   . Diabetes Sister   . Diabetes Sister   . Hypertension Sister   . Diabetes Sister   . Hypertension Sister   . Diabetes Brother   . Hyperlipidemia Brother   . Cancer Father        lung  . Heart attack Father   . Diabetes Brother   . Heart disease Brother   . Hyperlipidemia Brother   . Diabetes Brother   . Diabetes Son   . Colon cancer Neg Hx   . Rectal cancer Neg Hx   . Stomach cancer Neg Hx   . Esophageal cancer Neg Hx     Past Surgical History:  Procedure Laterality Date  . CATARACT EXTRACTION, BILATERAL    . COLONOSCOPY    . CRANIECTOMY / CRANIOTOMY FOR EXCISION OF BRAIN TUMOR     Left side brain  . MASTECTOMY Right   . PARTIAL HYSTERECTOMY    . POLYPECTOMY    . SKIN CANCER EXCISION Left    Under left eye   Social History   Occupational History  . Occupation: retired  Tobacco Use  . Smoking status: Former Smoker    Types: Cigarettes    Last attempt to quit: 02/09/1961    Years since quitting: 57.3  . Smokeless tobacco: Never Used  Substance and Sexual Activity  . Alcohol use: No    Alcohol/week: 0.0 standard drinks  . Drug use: No  . Sexual activity: Never

## 2018-05-31 NOTE — Procedures (Signed)
EMG & NCV Findings: Evaluation of the right median motor nerve showed decreased conduction velocity (Elbow-Wrist, 48 m/s).  The right median (across palm) sensory nerve showed prolonged distal peak latency (Wrist, 4.4 ms), reduced amplitude (5.0 V), and prolonged distal peak latency (Palm, 2.3 ms).  The right ulnar sensory nerve showed reduced amplitude (6.6 V).  All remaining nerves (as indicated in the following tables) were within normal limits.    All examined muscles (as indicated in the following table) showed no evidence of electrical instability.    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   There is no significant electrodiagnostic evidence of any other focal nerve entrapment, brachial plexopathy or cervical radiculopathy.   Recommendations: 1.  Follow-up with referring physician. 2.  Continue current management of symptoms. 3.  Continue use of resting splint at night-time and as needed during the day.  ___________________________ Wonda Olds Board Certified, American Board of Physical Medicine and Rehabilitation    Nerve Conduction Studies Anti Sensory Summary Table   Stim Site NR Peak (ms) Norm Peak (ms) P-T Amp (V) Norm P-T Amp Site1 Site2 Delta-P (ms) Dist (cm) Vel (m/s) Norm Vel (m/s)  Right Median Acr Palm Anti Sensory (2nd Digit)  30.8C  Wrist    *4.4 <3.6 *5.0 >10 Wrist Palm 2.1 0.0    Palm    *2.3 <2.0 4.5         Right Radial Anti Sensory (Base 1st Digit)  31.1C  Wrist    2.4 <3.1 4.6  Wrist Base 1st Digit 2.4 0.0    Right Ulnar Anti Sensory (5th Digit)  31.3C  Wrist    3.4 <3.7 *6.6 >15.0 Wrist 5th Digit 3.4 14.0 41 >38   Motor Summary Table   Stim Site NR Onset (ms) Norm Onset (ms) O-P Amp (mV) Norm O-P Amp Site1 Site2 Delta-0 (ms) Dist (cm) Vel (m/s) Norm Vel (m/s)  Right Median  Motor (Abd Poll Brev)  31.4C  Wrist    3.2 <4.2 5.8 >5 Elbow Wrist 4.0 19.0 *48 >50  Elbow    7.2  5.6         Right Ulnar Motor (Abd Dig Min)  31.4C  Wrist    3.4 <4.2 4.6 >3 B Elbow Wrist 3.3 19.0 58 >53  B Elbow    6.7  5.1  A Elbow B Elbow 1.3 11.0 85 >53  A Elbow    8.0  4.1          EMG   Side Muscle Nerve Root Ins Act Fibs Psw Amp Dur Poly Recrt Int Fraser Din Comment  Right Abd Poll Brev Median C8-T1 Nml Nml Nml Nml Nml 0 Nml Nml   Right 1stDorInt Ulnar C8-T1 Nml Nml Nml Nml Nml 0 Nml Nml   Right PronatorTeres Median C6-7 Nml Nml Nml Nml Nml 0 Nml Nml   Right Biceps Musculocut C5-6 Nml Nml Nml Nml Nml 0 Nml Nml   Right Deltoid Axillary C5-6 Nml Nml Nml Nml Nml 0 Nml Nml     Nerve Conduction Studies Anti Sensory Left/Right Comparison   Stim Site L Lat (ms) R Lat (ms) L-R Lat (ms) L Amp (V) R Amp (V) L-R Amp (%) Site1 Site2 L Vel (m/s) R Vel (m/s) L-R Vel (m/s)  Median Acr Palm Anti Sensory (2nd Digit)  30.8C  Wrist  *4.4   *5.0  Wrist Palm     Palm  *2.3   4.5        Radial Anti Sensory (Base 1st Digit)  31.1C  Wrist  2.4   4.6  Wrist Base 1st Digit     Ulnar Anti Sensory (5th Digit)  31.3C  Wrist  3.4   *6.6  Wrist 5th Digit  41    Motor Left/Right Comparison   Stim Site L Lat (ms) R Lat (ms) L-R Lat (ms) L Amp (mV) R Amp (mV) L-R Amp (%) Site1 Site2 L Vel (m/s) R Vel (m/s) L-R Vel (m/s)  Median Motor (Abd Poll Brev)  31.4C  Wrist  3.2   5.8  Elbow Wrist  *48   Elbow  7.2   5.6        Ulnar Motor (Abd Dig Min)  31.4C  Wrist  3.4   4.6  B Elbow Wrist  58   B Elbow  6.7   5.1  A Elbow B Elbow  85   A Elbow  8.0   4.1           Waveforms:

## 2018-05-31 NOTE — Progress Notes (Signed)
Krystal [name] - 77 y.o. female MRN [id number]  Date of birth: 11-22-1940  Office Visit Note: Visit Date: 05/25/2018 PCP: Chipper Herb, MD Referred by: Chipper Herb, MD  Subjective: Chief Complaint  Patient presents with  . Right Upper Arm - Pain  . Right Elbow - Tingling  . Right Hand - Tingling  . Right Forearm - Tingling   HPI: Krystal [name] is a 77 y.o. female who comes in today For planned electrodiagnostic study of the right upper limb.  This is requested by Dr. Meridee Score.  She reports pain numbness tingling in the right upper arm and right elbow and right forearm as well as the right hand mainly the index and middle finger.  She reports symptoms started 3 years ago without any known injury.  She reports symptoms are constant and nothing seems to make it worse or better.  A brace at night seems to help with some of the symptoms.  She does report nocturnal complaints.  She reports he can hardly get any rest.  Interestingly she has left hand dominant.  She denies any left-sided complaints.  ROS Otherwise per HPI.  Assessment & Plan: Visit Diagnoses:  1. 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   There is no significant electrodiagnostic evidence of any other focal nerve entrapment, brachial plexopathy or cervical radiculopathy.   Recommendations: 1.  Follow-up with referring physician. 2.  Continue current management of symptoms. 3.  Continue use of resting splint at night-time and as needed during the day.  ___________________________   Meds & Orders: No orders of the defined types were placed in this encounter.   Orders Placed This Encounter  Procedures  . NCV with EMG (electromyography)    Follow-up: Return for Meridee Score, MD.   Procedures: No  procedures performed  EMG & NCV Findings: Evaluation of the right median motor nerve showed decreased conduction velocity (Elbow-Wrist, 48 m/s).  The right median (across palm) sensory nerve showed prolonged distal peak latency (Wrist, 4.4 ms), reduced amplitude (5.0 V), and prolonged distal peak latency (Palm, 2.3 ms).  The right ulnar sensory nerve showed reduced amplitude (6.6 V).  All remaining nerves (as indicated in the following tables) were within normal limits.    All examined muscles (as indicated in the following table) showed no evidence of electrical instability.    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   There is no significant electrodiagnostic evidence of any other focal nerve entrapment, brachial plexopathy or cervical radiculopathy.   Recommendations: 1.  Follow-up with referring physician. 2.  Continue current management of symptoms. 3.  Continue use of resting splint at night-time and as needed during the day.  ___________________________ Wonda Olds Board Certified, American Board of Physical Medicine and Rehabilitation    Nerve Conduction Studies Anti Sensory Summary Table   Stim Site NR Peak (ms) Norm Peak (ms) P-T Amp (V) Norm P-T Amp Site1 Site2 Delta-P (ms) Dist (cm) Vel (m/s) Norm Vel (m/s)  Right Median Acr Palm Anti Sensory (2nd Digit)  30.8C  Wrist    *4.4 <3.6 *5.0 >10 Wrist Palm 2.1 0.0    Palm    *2.3 <2.0  4.5         Right Radial Anti Sensory (Base 1st Digit)  31.1C  Wrist    2.4 <3.1 4.6  Wrist Base 1st Digit 2.4 0.0    Right Ulnar Anti Sensory (5th Digit)  31.3C  Wrist    3.4 <3.7 *6.6 >15.0 Wrist 5th Digit 3.4 14.0 41 >38   Motor Summary Table   Stim Site NR Onset (ms) Norm Onset (ms) O-P Amp (mV) Norm O-P Amp Site1 Site2 Delta-0 (ms) Dist (cm) Vel (m/s) Norm Vel  (m/s)  Right Median Motor (Abd Poll Brev)  31.4C  Wrist    3.2 <4.2 5.8 >5 Elbow Wrist 4.0 19.0 *48 >50  Elbow    7.2  5.6         Right Ulnar Motor (Abd Dig Min)  31.4C  Wrist    3.4 <4.2 4.6 >3 B Elbow Wrist 3.3 19.0 58 >53  B Elbow    6.7  5.1  A Elbow B Elbow 1.3 11.0 85 >53  A Elbow    8.0  4.1          EMG   Side Muscle Nerve Root Ins Act Fibs Psw Amp Dur Poly Recrt Int Fraser Din Comment  Right Abd Poll Brev Median C8-T1 Nml Nml Nml Nml Nml 0 Nml Nml   Right 1stDorInt Ulnar C8-T1 Nml Nml Nml Nml Nml 0 Nml Nml   Right PronatorTeres Median C6-7 Nml Nml Nml Nml Nml 0 Nml Nml   Right Biceps Musculocut C5-6 Nml Nml Nml Nml Nml 0 Nml Nml   Right Deltoid Axillary C5-6 Nml Nml Nml Nml Nml 0 Nml Nml     Nerve Conduction Studies Anti Sensory Left/Right Comparison   Stim Site L Lat (ms) R Lat (ms) L-R Lat (ms) L Amp (V) R Amp (V) L-R Amp (%) Site1 Site2 L Vel (m/s) R Vel (m/s) L-R Vel (m/s)  Median Acr Palm Anti Sensory (2nd Digit)  30.8C  Wrist  *4.4   *5.0  Wrist Palm     Palm  *2.3   4.5        Radial Anti Sensory (Base 1st Digit)  31.1C  Wrist  2.4   4.6  Wrist Base 1st Digit     Ulnar Anti Sensory (5th Digit)  31.3C  Wrist  3.4   *6.6  Wrist 5th Digit  41    Motor Left/Right Comparison   Stim Site L Lat (ms) R Lat (ms) L-R Lat (ms) L Amp (mV) R Amp (mV) L-R Amp (%) Site1 Site2 L Vel (m/s) R Vel (m/s) L-R Vel (m/s)  Median Motor (Abd Poll Brev)  31.4C  Wrist  3.2   5.8  Elbow Wrist  *48   Elbow  7.2   5.6        Ulnar Motor (Abd Dig Min)  31.4C  Wrist  3.4   4.6  B Elbow Wrist  58   B Elbow  6.7   5.1  A Elbow B Elbow  85   A Elbow  8.0   4.1           Waveforms:            Clinical History: No specialty comments available.   She reports that she quit smoking about 57 years ago. Her smoking use included cigarettes. She has never used smokeless tobacco. No results for input(s): HGBA1C, LABURIC in the last 8760 hours.  Objective:  VS:  HT:    WT:  BMI:      BP:   HR: bpm  TEMP: ( )  RESP:  Physical Exam  Constitutional: She is oriented to person, place, and time.  Musculoskeletal:  Inspection reveals no atrophy of the bilateral APB or FDI or hand intrinsics. There is no swelling, color changes, allodynia or dystrophic changes. There is 5 out of 5 strength in the bilateral wrist extension, finger abduction and long finger flexion. There is intact sensation to light touch in all dermatomal and peripheral nerve distributions. There is a positive Phalen's test on the right. There is a negative Hoffmann's test bilaterally.  Neurological: She is alert and oriented to person, place, and time. She exhibits normal muscle tone. Coordination normal.  Skin: Skin is warm and dry. No erythema.    Ortho Exam Imaging: No results found.  Past Medical/Family/Surgical/Social History: Medications & Allergies reviewed per EMR, new medications updated. Patient Active Problem List   Diagnosis Date Noted  . Carpal tunnel syndrome of right wrist 03/25/2018  . Influenza with pneumonia 10/30/2015  . CAP (community acquired pneumonia) 10/29/2015  . Abdominal aortic atherosclerosis ([place]) 04/10/2015  . PSVT (paroxysmal supraventricular tachycardia) ([place]) 05/10/2014  . Macular degeneration, age related, nonexudative 08/24/2013  . History of right mastectomy 07/13/2013  . Anemia, iron deficiency 06/21/2013  . Allergic rhinitis 06/21/2013  . Chronic low back pain 06/21/2013  . Malignant neoplasm of female breast ([place]) 11/22/2007  . Mixed hyperlipidemia 11/22/2007  . Anxiety state 11/22/2007  . DEPRESSION 11/22/2007  . Asthma 11/22/2007  . GERD 11/22/2007  . GASTRITIS, WITH HEMORRHAGE 08/14/2007  . DUODENITIS, WITHOUT HEMORRHAGE 08/14/2007  . HIATAL HERNIA 08/14/2007  . 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   Past Medical History:  Diagnosis Date  . Anemia   . Asthma   . Brain tumor ([place])    1986  . Breast cancer ([place])    Right breast  . Cataract   .  Colon polyps   . COPD (chronic obstructive pulmonary disease) ([place])   . Hiatal hernia   . Hyperlipidemia   . Leg edema   . Macular degeneration   . Osteoporosis   . Pneumonia   . PVD (posterior vitreous detachment)   . Right knee pain   . Shingles rash    Family History  Problem Relation Age of Onset  . Heart disease Mother 35       Stroke   . Dementia Mother   . Congestive Heart Failure Mother   . Osteoporosis Mother   . Heart disease Sister   . Hypertension Sister   . Diabetes Sister   . Osteoporosis Sister   . Heart disease Brother   . Hyperlipidemia Brother   . Diabetes Brother   . Heart disease Sister   . Hypertension Sister   . Diabetes Sister   . Diabetes Sister   . Hypertension Sister   . Diabetes Sister   . Hypertension Sister   . Diabetes Brother   . Hyperlipidemia Brother   . Cancer Father        lung  . Heart attack Father   . Diabetes Brother   . Heart disease Brother   . Hyperlipidemia Brother   . Diabetes Brother   . Diabetes Son   . Colon cancer Neg Hx   . Rectal cancer Neg Hx   . Stomach cancer Neg Hx   . Esophageal cancer Neg Hx    Past Surgical History:  Procedure Laterality Date  . CATARACT EXTRACTION, BILATERAL    .  COLONOSCOPY    . CRANIECTOMY / CRANIOTOMY FOR EXCISION OF BRAIN TUMOR     Left side brain  . MASTECTOMY Right   . PARTIAL HYSTERECTOMY    . POLYPECTOMY    . SKIN CANCER EXCISION Left    Under left eye   Social History   Occupational History  . Occupation: retired  Tobacco Use  . Smoking status: Former Smoker    Types: Cigarettes    Last attempt to quit: 02/09/1961    Years since quitting: 57.3  . Smokeless tobacco: Never Used  Substance and Sexual Activity  . Alcohol use: No    Alcohol/week: 0.0 standard drinks  . Drug use: No  . Sexual activity: Never

## 2018-07-05 ENCOUNTER — Ambulatory Visit: Payer: Medicare Other | Admitting: Family Medicine

## 2018-07-16 ENCOUNTER — Telehealth (INDEPENDENT_AMBULATORY_CARE_PROVIDER_SITE_OTHER): Payer: Self-pay | Admitting: Orthopedic Surgery

## 2018-07-16 NOTE — Telephone Encounter (Signed)
Left message on voicemail for Krystal [name] to return my call to discuss scheduling wrist surgery.

## 2018-07-19 ENCOUNTER — Other Ambulatory Visit: Payer: Self-pay | Admitting: Family Medicine

## 2018-07-19 ENCOUNTER — Other Ambulatory Visit: Payer: Self-pay | Admitting: *Deleted

## 2018-07-19 MED ORDER — DENOSUMAB 60 MG/ML ~~LOC~~ SOSY: 60.0000 mg | PREFILLED_SYRINGE | Freq: Once | SUBCUTANEOUS | 0 refills | Status: AC

## 2018-07-20 ENCOUNTER — Other Ambulatory Visit: Payer: Self-pay | Admitting: Family Medicine

## 2018-07-20 NOTE — Telephone Encounter (Signed)
Last seen 03/06/18

## 2018-07-21 ENCOUNTER — Ambulatory Visit (INDEPENDENT_AMBULATORY_CARE_PROVIDER_SITE_OTHER): Payer: Medicare Other | Admitting: Family Medicine

## 2018-07-21 ENCOUNTER — Encounter: Payer: Self-pay | Admitting: Family Medicine

## 2018-07-21 VITALS — BP 95/59 | HR 88 | Temp 96.8°F | Ht 63.0 in | Wt 157.0 lb

## 2018-07-21 DIAGNOSIS — H3530 Unspecified macular degeneration: Secondary | ICD-10-CM | POA: Diagnosis not present

## 2018-07-21 DIAGNOSIS — I700 Atherosclerosis of aorta: Secondary | ICD-10-CM | POA: Diagnosis not present

## 2018-07-21 DIAGNOSIS — M810 Age-related osteoporosis without current pathological fracture: Secondary | ICD-10-CM | POA: Diagnosis not present

## 2018-07-21 DIAGNOSIS — L570 Actinic keratosis: Secondary | ICD-10-CM

## 2018-07-21 DIAGNOSIS — E7800 Pure hypercholesterolemia, unspecified: Secondary | ICD-10-CM | POA: Diagnosis not present

## 2018-07-21 DIAGNOSIS — I471 Supraventricular tachycardia, unspecified: Secondary | ICD-10-CM

## 2018-07-21 DIAGNOSIS — E559 Vitamin D deficiency, unspecified: Secondary | ICD-10-CM

## 2018-07-21 DIAGNOSIS — I10 Essential (primary) hypertension: Secondary | ICD-10-CM | POA: Diagnosis not present

## 2018-07-21 DIAGNOSIS — D509 Iron deficiency anemia, unspecified: Secondary | ICD-10-CM

## 2018-07-21 DIAGNOSIS — G5601 Carpal tunnel syndrome, right upper limb: Secondary | ICD-10-CM

## 2018-07-21 NOTE — Progress Notes (Signed)
Subjective:    Patient ID: Krystal [name], female    DOB: November 16, 1940, 77 y.o.   MRN: [id number]  HPI Pt here for follow up and management of chronic medical problems which includes hypertension and anemia. She is taking medication regularly.  The patient had a colonoscopy in September by Dr. Ardis Hughs and had 4 polyps removed and he will plan to repeat the colonoscopy in 3 to 5 years and will let patient know this.  Also had peripheral nerve conduction studies indicating a mild to moderate right median nerve entrapment at the wrist or carpal tunnel syndrome.  We will make sure that she has followed up with a hand surgeon regarding this.  Today she does complain with some skin lesions.  Her vital signs are stable.  She will get lab work today.  She will be given an FOBT to return.  She is on Nexium and simvastatin and iron replacement.  She has nitroglycerin to take as needed.  She takes Zyrtec for her allergies.  She also takes Singulair.  The patient was reminded that sedating antihistamines increase the risk of dementia and she will change to Claritin instead of Zyrtec.  Today she denies any chest pain pressure tightness or shortness of breath anymore than usual.  She does see the cardiologist in the next 2 to 3 months.  She denies any trouble with swallowing heartburn indigestion nausea vomiting diarrhea blood in the stool or black tarry bowel movements or change in bowel habits.  She is passing her water well just frequently.  She is past due on her mammogram and she will plan to get this sometime in the coming spring and has not noticed any lumps or masses that she has felt herself.    Patient Active Problem List   Diagnosis Date Noted  . Carpal tunnel syndrome of right wrist 03/25/2018  . Influenza with pneumonia 10/30/2015  . CAP (community acquired pneumonia) 10/29/2015  . Abdominal aortic atherosclerosis ([place]) 04/10/2015  . PSVT (paroxysmal supraventricular tachycardia) ([place]) 05/10/2014    . Macular degeneration, age related, nonexudative 08/24/2013  . History of right mastectomy 07/13/2013  . Anemia, iron deficiency 06/21/2013  . Allergic rhinitis 06/21/2013  . Chronic low back pain 06/21/2013  . Malignant neoplasm of female breast ([place]) 11/22/2007  . Mixed hyperlipidemia 11/22/2007  . Anxiety state 11/22/2007  . DEPRESSION 11/22/2007  . Asthma 11/22/2007  . GERD 11/22/2007  . GASTRITIS, WITH HEMORRHAGE 08/14/2007  . DUODENITIS, WITHOUT HEMORRHAGE 08/14/2007  . HIATAL HERNIA 08/14/2007  . 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   Outpatient Encounter Medications as of 07/21/2018  Medication Sig  . aspirin EC 81 MG tablet Take 81 mg by mouth at bedtime.  . Calcium Citrate-Vitamin D (CALCIUM CITRATE + D) 315-250 MG-UNIT TABS Take 2 tablets by mouth daily.  . cetirizine (ZYRTEC) 10 MG tablet Take 5-10 mg by mouth at bedtime.   . Cholecalciferol (VITAMIN D) 2000 UNITS CAPS Take 1 capsule by mouth at bedtime.  . diazepam (VALIUM) 5 MG tablet TAKE 1 TABLET BY MOUTH EVERY 6 HOURS AS NEEDED  . diltiazem (CARDIZEM CD) 240 MG 24 hr capsule TAKE ONE CAPSULE BY MOUTH ONE TIME DAILY  . esomeprazole (NEXIUM) 40 MG capsule TAKE 1 CAPSULE (40 MG TOTAL) BY MOUTH DAILY.  . ferrous sulfate 325 (65 FE) MG tablet Take 325 mg by mouth at bedtime.  Marland Kitchen ipratropium-albuterol (DUONEB) 0.5-2.5 (3) MG/3ML SOLN Take 3 mLs by nebulization every 6 (six) hours as needed. Dx:  Asthma 493.90  .  montelukast (SINGULAIR) 10 MG tablet TAKE 1 TABLET (10 MG TOTAL) BY MOUTH AT BEDTIME.  . Multiple Vitamins-Minerals (PRESERVISION/LUTEIN PO) Take 1 capsule by mouth at bedtime.   . nitroGLYCERIN (NITROLINGUAL) 0.4 MG/SPRAY spray ONE SPRAY UNDER TONGUE AS NEED FOR CHESTPAIN. MAY REPEAT IN 5 MINUTES IF PAIN NOT RELIEVED CALL 911.  Marland Kitchen PROAIR HFA 108 (90 Base) MCG/ACT inhaler INHALE TWO PUFFS BY MOUTH 4 TIMES DAILY AS NEEDED  . PROLIA 60 MG/ML SOSY injection TO BE ADMINISTERED IN PHYSICIAN'S OFFICE. INJECT ONE SYRINGE  SUBCOUSLY ONCE EVERY 6 MONTHS. REFRIGERATE. USE WITHIN 14 DAYS ONCE AT ROOM TEMPERATURE.  Marland Kitchen RESTASIS 0.05 % ophthalmic emulsion Place 1 drop into both eyes 2 (two) times daily.  . simvastatin (ZOCOR) 10 MG tablet TAKE ONE TABLET BY MOUTH ONE TIME DAILY  . [DISCONTINUED] cefdinir (OMNICEF) 300 MG capsule Take 1 capsule (300 mg total) by mouth 2 (two) times daily. 1 po BID  . [DISCONTINUED] predniSONE (DELTASONE) 20 MG tablet 2 po at same time daily for 5 days   No facility-administered encounter medications on file as of 07/21/2018.      Review of Systems  Constitutional: Negative.   HENT: Negative.   Eyes: Negative.   Respiratory: Negative.   Cardiovascular: Negative.   Gastrointestinal: Negative.   Endocrine: Negative.   Genitourinary: Negative.   Musculoskeletal: Negative.   Skin: Negative.        Several skin lesions -= left side face  Allergic/Immunologic: Negative.   Neurological: Negative.   Hematological: Negative.   Psychiatric/Behavioral: Negative.        Objective:   Physical Exam Vitals signs and nursing note reviewed.  Constitutional:      Appearance: Normal appearance. She is well-developed. She is obese. She is not ill-appearing.     Comments: Patient is pleasant and alert and actually looks better than she did years ago when she was so busy taking care of her mother.  Her son lives with her now and he is taking care of her which is wonderful.  HENT:     Head: Normocephalic and atraumatic.     Right Ear: Tympanic membrane, ear canal and external ear normal. There is no impacted cerumen.     Left Ear: Tympanic membrane, ear canal and external ear normal. There is no impacted cerumen.     Nose: Nose normal.     Mouth/Throat:     Mouth: Mucous membranes are moist.     Pharynx: Oropharynx is clear. No oropharyngeal exudate.  Eyes:     General: No scleral icterus.       Right eye: No discharge.        Left eye: No discharge.     Extraocular Movements:  Extraocular movements intact.     Conjunctiva/sclera: Conjunctivae normal.     Pupils: Pupils are equal, round, and reactive to light.     Comments: Patient sees ophthalmologist regularly, Dr. Baird Cancer because of macular degeneration.  Neck:     Musculoskeletal: Normal range of motion and neck supple.     Thyroid: No thyromegaly.     Vascular: No carotid bruit or JVD.  Cardiovascular:     Rate and Rhythm: Normal rate and regular rhythm.     Pulses: Normal pulses.     Heart sounds: Normal heart sounds. No murmur.     Comments: The heart is regular at 72/min today Pulmonary:     Effort: Pulmonary effort is normal.     Breath sounds: Normal breath sounds. No wheezing  or rales.  Abdominal:     General: Abdomen is flat. Bowel sounds are normal. There is no distension.     Palpations: Abdomen is soft. There is no mass.     Tenderness: There is abdominal tenderness.     Comments: Generalized tenderness no masses organ enlargement or bruits.  Musculoskeletal: Normal range of motion.        General: No tenderness.     Right lower leg: No edema.     Left lower leg: No edema.  Lymphadenopathy:     Cervical: No cervical adenopathy.  Skin:    General: Skin is warm and dry.     Findings: Lesion present.     Comments: Cryotherapy actinic keratosis left cheek.  Patient tolerated procedure well  Neurological:     Mental Status: She is alert and oriented to person, place, and time. Mental status is at baseline.     Cranial Nerves: No cranial nerve deficit.     Deep Tendon Reflexes: Reflexes are normal and symmetric.     Comments: Patient has carpal tunnel syndrome and is getting ready for carpal tunnel surgery with Dr. Sharol Given on the right.  Psychiatric:        Mood and Affect: Mood normal.        Behavior: Behavior normal.        Thought Content: Thought content normal.        Judgment: Judgment normal.     Comments: Mood affect and behavior for this patient are all normal and stable.    BP  (!) 95/59 (BP Location: Left Arm)   Pulse 88   Temp (!) 96.8 F (36 C) (Oral)   Ht _0  (1.6 m)   Wt 157 lb (71.2 kg)   BMI 27.81 kg/m        Assessment & Plan:  1. Pure hypercholesterolemia -Continue with simvastatin and as aggressive therapeutic lifestyle changes as possible - CBC with Differential/Platelet - Lipid panel  2. Vitamin D deficiency -Continue with vitamin D replacement pending results of lab work - CBC with Differential/Platelet - VITAMIN D 25 Hydroxy (Vit-D Deficiency, Fractures)  3. Essential hypertension -Blood pressure is good today and she will continue with current treatment - BMP8+EGFR - CBC with Differential/Platelet - Hepatic function panel  4. Thoracic aortic atherosclerosis ([place]) -Continue with therapeutic lifestyle changes and statin therapy - CBC with Differential/Platelet - Lipid panel  5. Iron deficiency anemia, unspecified iron deficiency anemia type -Continue with iron replacement pending results of lab work - CBC with Differential/Platelet  6. Actinic keratosis of left cheek -Cryotherapy to actinic keratosis left cheek patient tolerated procedure well  7. Abdominal aortic atherosclerosis ([place]) -Continue with as aggressive therapeutic lifestyle changes as possible along with statin therapy  8. Age-related osteoporosis without current pathological fracture -Patient may be starting Prolia  9. Paroxysmal supraventricular tachycardia ([place]) -Follow-up with cardiology as planned  10. Carpal tunnel syndrome on right -Follow-up with Dr. Sharol Given as planned  41. Macular degeneration of both eyes, unspecified type -Follow-up with ophthalmology as planned  Patient Instructions                       Medicare Annual Wellness Visit  [place] and the medical providers at [facility] strive to bring you the best medical care.  In doing so we not only want to address your current medical conditions and concerns but also  to detect new conditions early and prevent illness, disease and  health-related problems.    Medicare offers a yearly Wellness Visit which allows our clinical staff to assess your need for preventative services including immunizations, lifestyle education, counseling to decrease risk of preventable diseases and screening for fall risk and other medical concerns.    This visit is provided free of charge (no copay) for all Medicare recipients. The clinical pharmacists at [facility] have begun to conduct these Wellness Visits which will also include a thorough review of all your medications.    As you primary medical provider recommend that you make an appointment for your Annual Wellness Visit if you have not done so already this year.  You may set up this appointment before you leave today or you may call back ([phone]) and schedule an appointment.  Please make sure when you call that you mention that you are scheduling your Annual Wellness Visit with the clinical pharmacist so that the appointment may be made for the proper length of time.     Continue current medications. Continue good therapeutic lifestyle changes which include good diet and exercise. Fall precautions discussed with patient. If an FOBT was given today- please return it to our front desk. If you are over 51 years old - you may need Prevnar 93 or the adult Pneumonia vaccine.  **Flu shots are available--- please call and schedule a FLU-CLINIC appointment**  After your visit with [place] today you will receive a survey in the mail or online from [employer] regarding your care with [place]. Please take a moment to fill this out. Your feedback is very important to [place] as you can help [place] better understand your patient needs as well as improve your experience and satisfaction. WE CARE ABOUT YOU!!!   Keep follow-up appointment with cardiology as planned Keep follow-up appointment with orthopedic surgeon who plans to repair  carpal tunnel syndrome as planned This winter drink plenty of fluids and stay well-hydrated Do not forget to get your mammogram this spring If the lesion we performed cryotherapy on today persists we may need referral to dermatology for removal  Arrie Senate MD

## 2018-07-21 NOTE — Patient Instructions (Addendum)
Medicare Annual Wellness Visit  [facility] and the medical providers at [facility] strive to bring you the best medical care.  In doing so we not only want to address your current medical conditions and concerns but also to detect new conditions early and prevent illness, disease and health-related problems.    Medicare offers a yearly Wellness Visit which allows our clinical staff to assess your need for preventative services including immunizations, lifestyle education, counseling to decrease risk of preventable diseases and screening for fall risk and other medical concerns.    This visit is provided free of charge (no copay) for all Medicare recipients. The clinical pharmacists at [facility] have begun to conduct these Wellness Visits which will also include a thorough review of all your medications.    As you primary medical provider recommend that you make an appointment for your Annual Wellness Visit if you have not done so already this year.  You may set up this appointment before you leave today or you may call back ([phone]) and schedule an appointment.  Please make sure when you call that you mention that you are scheduling your Annual Wellness Visit with the clinical pharmacist so that the appointment may be made for the proper length of time.     Continue current medications. Continue good therapeutic lifestyle changes which include good diet and exercise. Fall precautions discussed with patient. If an FOBT was given today- please return it to our front desk. If you are over 47 years old - you may need Prevnar 1 or the adult Pneumonia vaccine.  **Flu shots are available--- please call and schedule a FLU-CLINIC appointment**  After your visit with [place] today you will receive a survey in the mail or online from [employer] regarding your care with [place]. Please take a moment to fill this out. Your feedback is very  important to [place] as you can help [place] better understand your patient needs as well as improve your experience and satisfaction. WE CARE ABOUT YOU!!!   Keep follow-up appointment with cardiology as planned Keep follow-up appointment with orthopedic surgeon who plans to repair carpal tunnel syndrome as planned This winter drink plenty of fluids and stay well-hydrated Do not forget to get your mammogram this spring If the lesion we performed cryotherapy on today persists we may need referral to dermatology for removal

## 2018-07-22 LAB — CBC WITH DIFFERENTIAL/PLATELET
BASOS ABS: 0 10*3/uL (ref 0.0–0.2)
Basos: 1 %
EOS (ABSOLUTE): 0.1 10*3/uL (ref 0.0–0.4)
Eos: 2 %
Hematocrit: 42.9 % (ref 34.0–46.6)
Hemoglobin: 14.5 g/dL (ref 11.1–15.9)
Immature Grans (Abs): 0 10*3/uL (ref 0.0–0.1)
Immature Granulocytes: 0 %
Lymphocytes Absolute: 1.5 10*3/uL (ref 0.7–3.1)
Lymphs: 26 %
MCH: 30.1 pg (ref 26.6–33.0)
MCHC: 33.8 g/dL (ref 31.5–35.7)
MCV: 89 fL (ref 79–97)
MONOS ABS: 0.5 10*3/uL (ref 0.1–0.9)
Monocytes: 8 %
Neutrophils Absolute: 3.8 10*3/uL (ref 1.4–7.0)
Neutrophils: 63 %
PLATELETS: 189 10*3/uL (ref 150–450)
RBC: 4.81 x10E6/uL (ref 3.77–5.28)
RDW: 12.2 % — ABNORMAL LOW (ref 12.3–15.4)
WBC: 5.9 10*3/uL (ref 3.4–10.8)

## 2018-07-22 LAB — BMP8+EGFR
BUN/Creatinine Ratio: 20 (ref 12–28)
BUN: 16 mg/dL (ref 8–27)
CALCIUM: 9.9 mg/dL (ref 8.7–10.3)
CHLORIDE: 101 mmol/L (ref 96–106)
CO2: 26 mmol/L (ref 20–29)
Creatinine, Ser: 0.82 mg/dL (ref 0.57–1.00)
GFR, EST AFRICAN AMERICAN: 79 mL/min/{1.73_m2} (ref >59)
GFR, EST NON AFRICAN AMERICAN: 68 mL/min/{1.73_m2} (ref >59)
Glucose: 101 mg/dL — ABNORMAL HIGH (ref 65–99)
Potassium: 4.4 mmol/L (ref 3.5–5.2)
Sodium: 143 mmol/L (ref 134–144)

## 2018-07-22 LAB — LIPID PANEL
CHOLESTEROL TOTAL: 191 mg/dL (ref 100–199)
Chol/HDL Ratio: 2.9 ratio (ref 0.0–4.4)
HDL: 67 mg/dL (ref >39)
LDL Calculated: 100 mg/dL — ABNORMAL HIGH (ref 0–99)
Triglycerides: 122 mg/dL (ref 0–149)
VLDL Cholesterol Cal: 24 mg/dL (ref 5–40)

## 2018-07-22 LAB — HEPATIC FUNCTION PANEL
ALBUMIN: 4.5 g/dL (ref 3.5–4.8)
ALT: 11 IU/L (ref 0–32)
AST: 18 IU/L (ref 0–40)
Alkaline Phosphatase: 44 IU/L (ref 39–117)
Bilirubin Total: 0.5 mg/dL (ref 0.0–1.2)
Bilirubin, Direct: 0.12 mg/dL (ref 0.00–0.40)
Total Protein: 6.5 g/dL (ref 6.0–8.5)

## 2018-07-22 LAB — VITAMIN D 25 HYDROXY (VIT D DEFICIENCY, FRACTURES): Vit D, 25-Hydroxy: 37.1 ng/mL (ref 30.0–100.0)

## 2018-07-30 ENCOUNTER — Ambulatory Visit (INDEPENDENT_AMBULATORY_CARE_PROVIDER_SITE_OTHER): Payer: Medicare Other | Admitting: *Deleted

## 2018-07-30 DIAGNOSIS — M810 Age-related osteoporosis without current pathological fracture: Secondary | ICD-10-CM | POA: Diagnosis not present

## 2018-07-30 MED ORDER — DENOSUMAB 60 MG/ML ~~LOC~~ SOSY
60.0000 mg | PREFILLED_SYRINGE | Freq: Once | SUBCUTANEOUS | Status: AC
  Administered 2018-07-30: 60 mg via SUBCUTANEOUS

## 2018-07-30 NOTE — Progress Notes (Signed)
Prolia injection given and patient tolerated well.  

## 2018-07-30 NOTE — Patient Instructions (Signed)

## 2018-08-17 DIAGNOSIS — G5601 Carpal tunnel syndrome, right upper limb: Secondary | ICD-10-CM | POA: Diagnosis not present

## 2018-08-21 ENCOUNTER — Encounter: Payer: Self-pay | Admitting: Family Medicine

## 2018-08-21 ENCOUNTER — Ambulatory Visit: Payer: Medicare Other

## 2018-08-21 ENCOUNTER — Ambulatory Visit (INDEPENDENT_AMBULATORY_CARE_PROVIDER_SITE_OTHER): Payer: Medicare HMO | Admitting: Family Medicine

## 2018-08-21 VITALS — BP 106/57 | HR 74 | Temp 96.9°F | Wt 156.8 lb

## 2018-08-21 DIAGNOSIS — R300 Dysuria: Secondary | ICD-10-CM | POA: Diagnosis not present

## 2018-08-21 NOTE — Patient Instructions (Signed)

## 2018-08-21 NOTE — Progress Notes (Signed)
Subjective:    Patient ID: Krystal [name], female    DOB: August 24, 1940, 78 y.o.   MRN: [id number]  Chief Complaint:  Urinary Frequency and Dysuria   HPI: Krystal ABRUZZESE is a 78 y.o. female presenting on 08/21/2018 for Urinary Frequency and Dysuria   1. Dysuria   Pt presents today with urinary frequency and dysuria that started last night. She denies fever, chills, confusion, flank pain, or abdominal pain. She states she does not have any vaginal symptoms. Denies nausea, vomiting, or diarrhea.    Relevant past medical, surgical, family, and social history reviewed and updated as indicated.  Allergies and medications reviewed and updated.   Past Medical History:  Diagnosis Date  . Anemia   . Asthma   . Brain tumor ([place])    1986  . Breast cancer ([place])    Right breast  . Cataract   . Colon polyps   . COPD (chronic obstructive pulmonary disease) ([place])   . Hiatal hernia   . Hyperlipidemia   . Leg edema   . Macular degeneration   . Osteoporosis   . Pneumonia   . PVD (posterior vitreous detachment)   . Right knee pain   . Shingles rash     Past Surgical History:  Procedure Laterality Date  . CATARACT EXTRACTION, BILATERAL    . COLONOSCOPY    . CRANIECTOMY / CRANIOTOMY FOR EXCISION OF BRAIN TUMOR     Left side brain  . MASTECTOMY Right   . PARTIAL HYSTERECTOMY    . POLYPECTOMY    . SKIN CANCER EXCISION Left    Under left eye    Social History   Socioeconomic History  . Marital status: Widowed    Spouse name: Not on file  . Number of children: 1  . Years of education: 48th  . Highest education level: 7th grade  Occupational History  . Occupation: retired  Scientific laboratory technician  . Financial resource strain: Not hard at all  . Food insecurity:    Worry: Never true    Inability: Never true  . Transportation needs:    Medical: No    Non-medical: No  Tobacco Use  . Smoking status: Former Smoker    Types: Cigarettes    Last attempt to quit: 02/09/1961     Years since quitting: 57.5  . Smokeless tobacco: Never Used  Substance and Sexual Activity  . Alcohol use: No    Alcohol/week: 0.0 standard drinks  . Drug use: No  . Sexual activity: Never  Lifestyle  . Physical activity:    Days per week: 3 days    Minutes per session: 40 min  . Stress: Not on file  Relationships  . Social connections:    Talks on phone: Not on file    Gets together: Not on file    Attends religious service: Not on file    Active member of club or organization: Not on file    Attends meetings of clubs or organizations: Not on file    Relationship status: Not on file  . Intimate partner violence:    Fear of current or ex partner: No    Emotionally abused: No    Physically abused: No    Forced sexual activity: No  Other Topics Concern  . Not on file  Social History Narrative   Son lives with patient.   Retired from farming.   Highest level of education:  7th    Outpatient Encounter Medications as of  08/21/2018  Medication Sig  . aspirin EC 81 MG tablet Take 81 mg by mouth at bedtime.  . Calcium Citrate-Vitamin D (CALCIUM CITRATE + D) 315-250 MG-UNIT TABS Take 2 tablets by mouth daily.  . cetirizine (ZYRTEC) 10 MG tablet Take 5-10 mg by mouth at bedtime.   . Cholecalciferol (VITAMIN D) 2000 UNITS CAPS Take 1 capsule by mouth at bedtime.  . diazepam (VALIUM) 5 MG tablet TAKE 1 TABLET BY MOUTH EVERY 6 HOURS AS NEEDED  . diltiazem (CARDIZEM CD) 240 MG 24 hr capsule TAKE ONE CAPSULE BY MOUTH ONE TIME DAILY  . esomeprazole (NEXIUM) 40 MG capsule TAKE 1 CAPSULE (40 MG TOTAL) BY MOUTH DAILY.  . ferrous sulfate 325 (65 FE) MG tablet Take 325 mg by mouth at bedtime.  Marland Kitchen ipratropium-albuterol (DUONEB) 0.5-2.5 (3) MG/3ML SOLN Take 3 mLs by nebulization every 6 (six) hours as needed. Dx:  Asthma 493.90  . montelukast (SINGULAIR) 10 MG tablet TAKE 1 TABLET (10 MG TOTAL) BY MOUTH AT BEDTIME.  . Multiple Vitamins-Minerals (PRESERVISION/LUTEIN PO) Take 1 capsule by mouth at  bedtime.   . nitroGLYCERIN (NITROLINGUAL) 0.4 MG/SPRAY spray ONE SPRAY UNDER TONGUE AS NEED FOR CHESTPAIN. MAY REPEAT IN 5 MINUTES IF PAIN NOT RELIEVED CALL 911.  Marland Kitchen PROAIR HFA 108 (90 Base) MCG/ACT inhaler INHALE TWO PUFFS BY MOUTH 4 TIMES DAILY AS NEEDED  . PROLIA 60 MG/ML SOSY injection TO BE ADMINISTERED IN PHYSICIAN'S OFFICE. INJECT ONE SYRINGE SUBCOUSLY ONCE EVERY 6 MONTHS. REFRIGERATE. USE WITHIN 14 DAYS ONCE AT ROOM TEMPERATURE.  Marland Kitchen RESTASIS 0.05 % ophthalmic emulsion Place 1 drop into both eyes 2 (two) times daily.  . simvastatin (ZOCOR) 10 MG tablet TAKE ONE TABLET BY MOUTH ONE TIME DAILY   No facility-administered encounter medications on file as of 08/21/2018.     Allergies  Allergen Reactions  . Actonel [Risedronate] Nausea And Vomiting and Other (See Comments)    Throat tightness  . Fosamax [Alendronate Sodium] Other (See Comments)    esophagitis  . Mevacor [Lovastatin] Other (See Comments)    myalgias  . Miacalcin [Calcitonin ([place])] Nausea And Vomiting  . Mobic [Meloxicam] Hives and Itching    rash    Review of Systems  Constitutional: Negative for chills, fatigue and fever.  Gastrointestinal: Negative for abdominal pain, diarrhea, nausea and vomiting.  Genitourinary: Positive for dysuria and frequency. Negative for decreased urine volume, difficulty urinating, flank pain, hematuria, pelvic pain, urgency, vaginal bleeding, vaginal discharge and vaginal pain.  Neurological: Negative for weakness.  Psychiatric/Behavioral: Negative for confusion.  All other systems reviewed and are negative.       Objective:    BP (!) 106/57   Pulse 74   Temp (!) 96.9 F (36.1 C) (Oral)   Wt 156 lb 12.8 oz (71.1 kg)   BMI 27.78 kg/m    Wt Readings from Last 3 Encounters:  08/21/18 156 lb 12.8 oz (71.1 kg)  07/21/18 157 lb (71.2 kg)  05/31/18 164 lb (74.4 kg)    Physical Exam Vitals signs and nursing note reviewed.  Constitutional:      General: She is not in acute  distress.    Appearance: Normal appearance. She is not ill-appearing or toxic-appearing.  HENT:     Head: Normocephalic and atraumatic.     Mouth/Throat:     Mouth: Mucous membranes are moist.  Eyes:     Conjunctiva/sclera: Conjunctivae normal.     Pupils: Pupils are equal, round, and reactive to light.  Cardiovascular:  Rate and Rhythm: Normal rate and regular rhythm.     Heart sounds: Normal heart sounds. No murmur. No friction rub. No gallop.   Pulmonary:     Effort: Pulmonary effort is normal. No respiratory distress.     Breath sounds: Normal breath sounds.  Abdominal:     General: Abdomen is flat. Bowel sounds are normal. There is no distension.     Palpations: Abdomen is soft.     Tenderness: There is no abdominal tenderness. There is no right CVA tenderness or left CVA tenderness.  Skin:    General: Skin is warm and dry.     Capillary Refill: Capillary refill takes less than 2 seconds.  Neurological:     General: No focal deficit present.     Mental Status: She is alert and oriented to person, place, and time.  Psychiatric:        Mood and Affect: Mood normal.        Behavior: Behavior normal.        Thought Content: Thought content normal.        Judgment: Judgment normal.     Results for orders placed or performed in visit on 07/21/18  [facility]  Result Value Ref Range   Glucose 101 (H) 65 - 99 mg/dL   BUN 16 8 - 27 mg/dL   Creatinine, Ser 0.82 0.57 - 1.00 mg/dL   GFR calc non Af Amer 68 >59 mL/min/1.73   GFR calc Af Amer 79 >59 mL/min/1.73   BUN/Creatinine Ratio 20 12 - 28   Sodium 143 134 - 144 mmol/L   Potassium 4.4 3.5 - 5.2 mmol/L   Chloride 101 96 - 106 mmol/L   CO2 26 20 - 29 mmol/L   Calcium 9.9 8.7 - 10.3 mg/dL  CBC with Differential/Platelet  Result Value Ref Range   WBC 5.9 3.4 - 10.8 x10E3/uL   RBC 4.81 3.77 - 5.28 x10E6/uL   Hemoglobin 14.5 11.1 - 15.9 g/dL   Hematocrit 42.9 34.0 - 46.6 %   MCV 89 79 - 97 fL   MCH 30.1 26.6 - 33.0 pg    MCHC 33.8 31.5 - 35.7 g/dL   RDW 12.2 (L) 12.3 - 15.4 %   Platelets 189 150 - 450 x10E3/uL   Neutrophils 63 Not Estab. %   Lymphs 26 Not Estab. %   Monocytes 8 Not Estab. %   Eos 2 Not Estab. %   Basos 1 Not Estab. %   Neutrophils Absolute 3.8 1.4 - 7.0 x10E3/uL   Lymphocytes Absolute 1.5 0.7 - 3.1 x10E3/uL   Monocytes Absolute 0.5 0.1 - 0.9 x10E3/uL   EOS (ABSOLUTE) 0.1 0.0 - 0.4 x10E3/uL   Basophils Absolute 0.0 0.0 - 0.2 x10E3/uL   Immature Granulocytes 0 Not Estab. %   Immature Grans (Abs) 0.0 0.0 - 0.1 x10E3/uL  Lipid panel  Result Value Ref Range   Cholesterol, Total 191 100 - 199 mg/dL   Triglycerides 122 0 - 149 mg/dL   HDL 67 >39 mg/dL   VLDL Cholesterol Cal 24 5 - 40 mg/dL   LDL Calculated 100 (H) 0 - 99 mg/dL   Chol/HDL Ratio 2.9 0.0 - 4.4 ratio  VITAMIN D 25 Hydroxy (Vit-D Deficiency, Fractures)  Result Value Ref Range   Vit D, 25-Hydroxy 37.1 30.0 - 100.0 ng/mL  Hepatic function panel  Result Value Ref Range   Total Protein 6.5 6.0 - 8.5 g/dL   Albumin 4.5 3.5 - 4.8 g/dL   Bilirubin Total 0.5  0.0 - 1.2 mg/dL   Bilirubin, Direct 0.12 0.00 - 0.40 mg/dL   Alkaline Phosphatase 44 39 - 117 IU/L   AST 18 0 - 40 IU/L   ALT 11 0 - 32 IU/L     Urinalysis in office: Dip: trace ketones, 1+ protein, negative nitrites and leukocytes.  Pertinent labs & imaging results that were available during my care of the patient were reviewed by me and considered in my medical decision making.  Assessment & Plan:  Kawanna was seen today for urinary frequency and dysuria.  Diagnoses and all orders for this visit:  Dysuria Increase water intake. Can try cranberry juice. Avoid bladder irritants such as caffeine. Report any new or worsening symptoms. Culture pending, will treat if warranted.  -     Urinalysis -     Urine Culture    Continue all other maintenance medications.  Follow up plan: Return in about 2 weeks (around 09/04/2018), or if symptoms worsen or fail to  improve.  Educational handout given for   The above assessment and management plan was discussed with the patient. The patient verbalized understanding of and has agreed to the management plan. Patient is aware to call the clinic if symptoms persist or worsen. Patient is aware when to return to the clinic for a follow-up visit. Patient educated on when it is appropriate to go to the emergency department.   Monia Pouch, FNP-C [facility] [phone]

## 2018-08-22 LAB — URINE CULTURE

## 2018-08-23 LAB — URINALYSIS
Bilirubin, UA: NEGATIVE
Glucose, UA: NEGATIVE
Leukocytes, UA: NEGATIVE
NITRITE UA: NEGATIVE
RBC, UA: NEGATIVE
Specific Gravity, UA: >1.03 — ABNORMAL HIGH (ref 1.005–1.030)
Urobilinogen, Ur: 0.2 mg/dL (ref 0.2–1.0)
pH, UA: 5.5 (ref 5.0–7.5)

## 2018-08-26 ENCOUNTER — Encounter (INDEPENDENT_AMBULATORY_CARE_PROVIDER_SITE_OTHER): Payer: Self-pay | Admitting: Orthopedic Surgery

## 2018-08-26 ENCOUNTER — Ambulatory Visit (INDEPENDENT_AMBULATORY_CARE_PROVIDER_SITE_OTHER): Payer: Medicare HMO | Admitting: Orthopedic Surgery

## 2018-08-26 VITALS — Ht 63.0 in | Wt 156.0 lb

## 2018-08-26 DIAGNOSIS — Z9889 Other specified postprocedural states: Secondary | ICD-10-CM

## 2018-08-26 DIAGNOSIS — G5601 Carpal tunnel syndrome, right upper limb: Secondary | ICD-10-CM

## 2018-08-27 ENCOUNTER — Encounter (INDEPENDENT_AMBULATORY_CARE_PROVIDER_SITE_OTHER): Payer: Self-pay | Admitting: Orthopedic Surgery

## 2018-08-27 NOTE — Progress Notes (Signed)
Office Visit Note   Patient: Krystal [name]           Date of Birth: [birth year]           MRN: [id number] Visit Date: 08/26/2018              Requested by: Chipper Herb, MD [address] [place], [place] [address] PCP: Chipper Herb, MD  Chief Complaint  Patient presents with  . Right Wrist - Routine Post Op    08/17/2018 right CTR       HPI: Patient is a 78 year old woman with weeks status post carpal tunnel release.  Patient states she is healing well she has had no restrictions with the use of her hand.  Assessment & Plan: Visit Diagnoses:  1. Carpal tunnel syndrome of right wrist     Plan: Patient will increase her activities as tolerated recommended scar massage.  Follow-Up Instructions: Return in about 4 weeks (around 09/23/2018).   Ortho Exam  Patient is alert, oriented, no adenopathy, well-dressed, normal affect, normal respiratory effort. Examination patient's carpal tunnel surgery incision has healed nicely we will harvest the sutures today recommended scar massage.  She moves her fingers well no neurovascular compromise.  Imaging: No results found. No images are attached to the encounter.  Labs: Lab Results  Component Value Date   ESRSEDRATE 17 06/22/2017   CRP 0.2 (L) 06/22/2017   REPTSTATUS 11/03/2015 FINAL 10/29/2015   CULT NO GROWTH 5 DAYS 10/29/2015     Lab Results  Component Value Date   ALBUMIN 4.5 07/21/2018   ALBUMIN 4.2 02/25/2018   ALBUMIN 4.4 10/08/2017    Body mass index is 27.63 kg/m.  Orders:  No orders of the defined types were placed in this encounter.  No orders of the defined types were placed in this encounter.    Procedures: No procedures performed  Clinical Data: No additional findings.  ROS:  All other systems negative, except as noted in the HPI. Review of Systems  Objective: Vital Signs: Ht 5\' 3"  (1.6 m)   Wt 156 lb (70.8 kg)   BMI 27.63 kg/m   Specialty Comments:  No specialty comments  available.  PMFS History: Patient Active Problem List   Diagnosis Date Noted  . Carpal tunnel syndrome of right wrist 03/25/2018  . Influenza with pneumonia 10/30/2015  . CAP (community acquired pneumonia) 10/29/2015  . Abdominal aortic atherosclerosis ([place]) 04/10/2015  . PSVT (paroxysmal supraventricular tachycardia) ([place]) 05/10/2014  . Macular degeneration, age related, nonexudative 08/24/2013  . History of right mastectomy 07/13/2013  . Anemia, iron deficiency 06/21/2013  . Allergic rhinitis 06/21/2013  . Chronic low back pain 06/21/2013  . Malignant neoplasm of female breast ([place]) 11/22/2007  . Mixed hyperlipidemia 11/22/2007  . Anxiety state 11/22/2007  . DEPRESSION 11/22/2007  . Asthma 11/22/2007  . GERD 11/22/2007  . GASTRITIS, WITH HEMORRHAGE 08/14/2007  . DUODENITIS, WITHOUT HEMORRHAGE 08/14/2007  . HIATAL HERNIA 08/14/2007  . 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   Past Medical History:  Diagnosis Date  . Anemia   . Asthma   . Brain tumor ([place])    1986  . Breast cancer ([place])    Right breast  . Cataract   . Colon polyps   . COPD (chronic obstructive pulmonary disease) ([place])   . Hiatal hernia   . Hyperlipidemia   . Leg edema   . Macular degeneration   . Osteoporosis   . Pneumonia   . PVD (posterior vitreous detachment)   .  Right knee pain   . Shingles rash     Family History  Problem Relation Age of Onset  . Heart disease Mother 35       Stroke   . Dementia Mother   . Congestive Heart Failure Mother   . Osteoporosis Mother   . Heart disease Sister   . Hypertension Sister   . Diabetes Sister   . Osteoporosis Sister   . Heart disease Brother   . Hyperlipidemia Brother   . Diabetes Brother   . Heart disease Sister   . Hypertension Sister   . Diabetes Sister   . Diabetes Sister   . Hypertension Sister   . Diabetes Sister   . Hypertension Sister   . Diabetes Brother   . Hyperlipidemia Brother   . Cancer Father        lung  . Heart attack  Father   . Diabetes Brother   . Heart disease Brother   . Hyperlipidemia Brother   . Diabetes Brother   . Diabetes Son   . Colon cancer Neg Hx   . Rectal cancer Neg Hx   . Stomach cancer Neg Hx   . Esophageal cancer Neg Hx     Past Surgical History:  Procedure Laterality Date  . CATARACT EXTRACTION, BILATERAL    . COLONOSCOPY    . CRANIECTOMY / CRANIOTOMY FOR EXCISION OF BRAIN TUMOR     Left side brain  . MASTECTOMY Right   . PARTIAL HYSTERECTOMY    . POLYPECTOMY    . SKIN CANCER EXCISION Left    Under left eye   Social History   Occupational History  . Occupation: retired  Tobacco Use  . Smoking status: Former Smoker    Types: Cigarettes    Last attempt to quit: 02/09/1961    Years since quitting: 57.5  . Smokeless tobacco: Never Used  Substance and Sexual Activity  . Alcohol use: No    Alcohol/week: 0.0 standard drinks  . Drug use: No  . Sexual activity: Never

## 2018-09-05 ENCOUNTER — Other Ambulatory Visit: Payer: Self-pay | Admitting: Family Medicine

## 2018-09-12 DIAGNOSIS — N3000 Acute cystitis without hematuria: Secondary | ICD-10-CM | POA: Diagnosis not present

## 2018-09-12 DIAGNOSIS — R300 Dysuria: Secondary | ICD-10-CM | POA: Diagnosis not present

## 2018-09-23 ENCOUNTER — Ambulatory Visit (INDEPENDENT_AMBULATORY_CARE_PROVIDER_SITE_OTHER): Payer: Medicare HMO | Admitting: Orthopedic Surgery

## 2018-10-14 ENCOUNTER — Encounter (INDEPENDENT_AMBULATORY_CARE_PROVIDER_SITE_OTHER): Payer: Self-pay | Admitting: Orthopedic Surgery

## 2018-10-14 ENCOUNTER — Ambulatory Visit (INDEPENDENT_AMBULATORY_CARE_PROVIDER_SITE_OTHER): Payer: Medicare HMO | Admitting: Orthopedic Surgery

## 2018-10-14 ENCOUNTER — Other Ambulatory Visit: Payer: Self-pay

## 2018-10-14 VITALS — Ht 63.0 in | Wt 156.0 lb

## 2018-10-14 DIAGNOSIS — G5601 Carpal tunnel syndrome, right upper limb: Secondary | ICD-10-CM

## 2018-10-17 ENCOUNTER — Encounter (INDEPENDENT_AMBULATORY_CARE_PROVIDER_SITE_OTHER): Payer: Self-pay | Admitting: Orthopedic Surgery

## 2018-10-17 NOTE — Progress Notes (Signed)
Office Visit Note   Patient: Krystal [name]           Date of Birth: Jul 02, 1941           MRN: [id number] Visit Date: 10/14/2018              Requested by: Chipper Herb, MD [address] [place], [place] [address] PCP: Chipper Herb, MD  Chief Complaint  Patient presents with  . Right Wrist - Routine Post Op    08/17/2018 right CTR      HPI: Patient is a 78 year old woman who presents 2 months status post right carpal tunnel release.  Patient states she has some numbness and tingling in all of her fingers but otherwise feels like she is doing well.  Patient states that the tingling in her arm has resolved.  Assessment & Plan: Visit Diagnoses:  1. Carpal tunnel syndrome of right wrist     Plan: Continue with scar massage increase her activities as tolerated  Follow-Up Instructions: Return in about 4 weeks (around 11/11/2018).   Ortho Exam  Patient is alert, oriented, no adenopathy, well-dressed, normal affect, normal respiratory effort. Examination patient describes circumferential numbness around the right hand distal to the MCP joints of all digits.  There is no redness no tenderness to palpation over the transverse carpal ligament release there is no signs of infection she has full range of motion of all fingers.  Imaging: No results found. No images are attached to the encounter.  Labs: Lab Results  Component Value Date   ESRSEDRATE 17 06/22/2017   CRP 0.2 (L) 06/22/2017   REPTSTATUS 11/03/2015 FINAL 10/29/2015   CULT NO GROWTH 5 DAYS 10/29/2015     Lab Results  Component Value Date   ALBUMIN 4.5 07/21/2018   ALBUMIN 4.2 02/25/2018   ALBUMIN 4.4 10/08/2017    Body mass index is 27.63 kg/m.  Orders:  No orders of the defined types were placed in this encounter.  No orders of the defined types were placed in this encounter.    Procedures: No procedures performed  Clinical Data: No additional findings.  ROS:  All other systems negative,  except as noted in the HPI. Review of Systems  Objective: Vital Signs: Ht 5\' 3"  (1.6 m)   Wt 156 lb (70.8 kg)   BMI 27.63 kg/m   Specialty Comments:  No specialty comments available.  PMFS History: Patient Active Problem List   Diagnosis Date Noted  . Carpal tunnel syndrome of right wrist 03/25/2018  . Influenza with pneumonia 10/30/2015  . CAP (community acquired pneumonia) 10/29/2015  . Abdominal aortic atherosclerosis ([place]) 04/10/2015  . PSVT (paroxysmal supraventricular tachycardia) ([place]) 05/10/2014  . Macular degeneration, age related, nonexudative 08/24/2013  . History of right mastectomy 07/13/2013  . Anemia, iron deficiency 06/21/2013  . Allergic rhinitis 06/21/2013  . Chronic low back pain 06/21/2013  . Malignant neoplasm of female breast ([place]) 11/22/2007  . Mixed hyperlipidemia 11/22/2007  . Anxiety state 11/22/2007  . DEPRESSION 11/22/2007  . Asthma 11/22/2007  . GERD 11/22/2007  . GASTRITIS, WITH HEMORRHAGE 08/14/2007  . DUODENITIS, WITHOUT HEMORRHAGE 08/14/2007  . HIATAL HERNIA 08/14/2007  . 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   Past Medical History:  Diagnosis Date  . Anemia   . Asthma   . Brain tumor ([place])    1986  . Breast cancer ([place])    Right breast  . Cataract   . Colon polyps   . COPD (chronic obstructive pulmonary disease) ([place])   .  Hiatal hernia   . Hyperlipidemia   . Leg edema   . Macular degeneration   . Osteoporosis   . Pneumonia   . PVD (posterior vitreous detachment)   . Right knee pain   . Shingles rash     Family History  Problem Relation Age of Onset  . Heart disease Mother 85       Stroke   . Dementia Mother   . Congestive Heart Failure Mother   . Osteoporosis Mother   . Heart disease Sister   . Hypertension Sister   . Diabetes Sister   . Osteoporosis Sister   . Heart disease Brother   . Hyperlipidemia Brother   . Diabetes Brother   . Heart disease Sister   . Hypertension Sister   . Diabetes Sister   . Diabetes  Sister   . Hypertension Sister   . Diabetes Sister   . Hypertension Sister   . Diabetes Brother   . Hyperlipidemia Brother   . Cancer Father        lung  . Heart attack Father   . Diabetes Brother   . Heart disease Brother   . Hyperlipidemia Brother   . Diabetes Brother   . Diabetes Son   . Colon cancer Neg Hx   . Rectal cancer Neg Hx   . Stomach cancer Neg Hx   . Esophageal cancer Neg Hx     Past Surgical History:  Procedure Laterality Date  . CATARACT EXTRACTION, BILATERAL    . COLONOSCOPY    . CRANIECTOMY / CRANIOTOMY FOR EXCISION OF BRAIN TUMOR     Left side brain  . MASTECTOMY Right   . PARTIAL HYSTERECTOMY    . POLYPECTOMY    . SKIN CANCER EXCISION Left    Under left eye   Social History   Occupational History  . Occupation: retired  Tobacco Use  . Smoking status: Former Smoker    Types: Cigarettes    Last attempt to quit: 02/09/1961    Years since quitting: 57.7  . Smokeless tobacco: Never Used  Substance and Sexual Activity  . Alcohol use: No    Alcohol/week: 0.0 standard drinks  . Drug use: No  . Sexual activity: Never

## 2018-10-21 ENCOUNTER — Telehealth: Payer: Self-pay | Admitting: Family Medicine

## 2018-10-21 DIAGNOSIS — H53413 Scotoma involving central area, bilateral: Secondary | ICD-10-CM | POA: Diagnosis not present

## 2018-10-21 NOTE — Telephone Encounter (Signed)
Pt called and aware of recommendations

## 2018-10-21 NOTE — Telephone Encounter (Signed)
Take a day by day approach to this.  Without any symptoms at this point in time, no antibiotics would be necessary.  Gargle with warm salty water drink plenty of fluids take Tylenol for aches pains or fever.  Give [place] a call back tomorrow or later  if symptoms get worse.

## 2018-10-26 DIAGNOSIS — H53413 Scotoma involving central area, bilateral: Secondary | ICD-10-CM | POA: Diagnosis not present

## 2018-11-16 NOTE — Progress Notes (Signed)
Virtual Visit via Telephone Note   This visit type was conducted due to national recommendations for restrictions regarding the COVID-19 Pandemic (e.g. social distancing) in an effort to limit this patient's exposure and mitigate transmission in our community.  Due to her co-morbid illnesses, this patient is at least at moderate risk for complications without adequate follow up.  This format is felt to be most appropriate for this patient at this time.  The patient did not have access to video technology/had technical difficulties with video requiring transitioning to audio format only (telephone).  All issues noted in this document were discussed and addressed.  No physical exam could be performed with this format.  Please refer to the patient's chart for her  consent to telehealth for [facility].   Evaluation Performed:  Follow-up visit  Date:  11/17/2018   ID:  Krystal [name], Krystal [name] Mar 27, 1941, MRN [id number]  Patient Location: Home Provider Location: Home  PCP:  Chipper Herb, MD  Cardiologist:  Minus Breeding, MD [facility] Electrophysiologist:  None   Chief Complaint:  Chest ain.    History of Present Illness:    Krystal [name] is a 78 y.o. female who presents for follow up of SVT.   At the last visit she had chest pain and she had a negative Lexiscan Myoview.   Since I last saw her she continues to get some symptoms feeling like her chest is hurting.  This is almost changed from previous.  She might feel rapid heart rate with this at the same time.  She denies any nausea or vomiting though she might get a little sweaty or short of breath.  This might last for a few minutes to 30 minutes.  She lies down to go away.  It is unchanged from previous.  It happens a couple of times per month.  She cannot make it happen.  She was walking around [place] without bringing this on.  She is not getting any new shortness of breath, PND or orthopnea.  She does not know what her heart rate is  doing when this happens.  The patient does not have symptoms concerning for COVID-19 infection (fever, chills, cough, or new shortness of breath).    Past Medical History:  Diagnosis Date   Anemia    Asthma    Brain tumor ([place])    1986   Breast cancer ([place])    Right breast   Cataract    Colon polyps    COPD (chronic obstructive pulmonary disease) (HCC)    Hiatal hernia    Hyperlipidemia    Leg edema    Macular degeneration    Osteoporosis    Pneumonia    PVD (posterior vitreous detachment)    Right knee pain    Shingles rash    Past Surgical History:  Procedure Laterality Date   CATARACT EXTRACTION, BILATERAL     COLONOSCOPY     CRANIECTOMY / CRANIOTOMY FOR EXCISION OF BRAIN TUMOR     Left side brain   MASTECTOMY Right    PARTIAL HYSTERECTOMY     POLYPECTOMY     SKIN CANCER EXCISION Left    Under left eye     Current Meds  Medication Sig   aspirin EC 81 MG tablet Take 81 mg by mouth at bedtime.   Calcium Citrate-Vitamin D (CALCIUM CITRATE + D) 315-250 MG-UNIT TABS Take 2 tablets by mouth daily.   cetirizine (ZYRTEC) 10 MG tablet Take 5-10 mg by mouth at  bedtime.    Cholecalciferol (VITAMIN D) 2000 UNITS CAPS Take 1 capsule by mouth at bedtime.   diazepam (VALIUM) 5 MG tablet TAKE 1 TABLET BY MOUTH EVERY 6 HOURS AS NEEDED   diltiazem (CARDIZEM CD) 240 MG 24 hr capsule TAKE ONE CAPSULE BY MOUTH ONE TIME DAILY   esomeprazole (NEXIUM) 40 MG capsule TAKE 1 CAPSULE (40 MG TOTAL) BY MOUTH DAILY.   ferrous sulfate 325 (65 FE) MG tablet Take 325 mg by mouth at bedtime.   ipratropium-albuterol (DUONEB) 0.5-2.5 (3) MG/3ML SOLN Take 3 mLs by nebulization every 6 (six) hours as needed. Dx:  Asthma 493.90   montelukast (SINGULAIR) 10 MG tablet TAKE 1 TABLET (10 MG TOTAL) BY MOUTH AT BEDTIME.   Multiple Vitamins-Minerals (PRESERVISION/LUTEIN PO) Take 1 capsule by mouth at bedtime.    nitroGLYCERIN (NITROLINGUAL) 0.4 MG/SPRAY spray ONE SPRAY  UNDER TONGUE AS NEED FOR CHESTPAIN. MAY REPEAT IN 5 MINUTES IF PAIN NOT RELIEVED CALL 911.   PROLIA 60 MG/ML SOSY injection TO BE ADMINISTERED IN PHYSICIAN'S OFFICE. INJECT ONE SYRINGE SUBCOUSLY ONCE EVERY 6 MONTHS. REFRIGERATE. USE WITHIN 14 DAYS ONCE AT ROOM TEMPERATURE.   RESTASIS 0.05 % ophthalmic emulsion Place 1 drop into both eyes 2 (two) times daily.   simvastatin (ZOCOR) 10 MG tablet TAKE ONE TABLET BY MOUTH ONE TIME DAILY     Allergies:   Actonel [risedronate]; Fosamax [alendronate sodium]; Mevacor [lovastatin]; Miacalcin [calcitonin (salmon)]; and Mobic [meloxicam]   Social History   Tobacco Use   Smoking status: Former Smoker    Types: Cigarettes    Last attempt to quit: 02/09/1961    Years since quitting: 57.8   Smokeless tobacco: Never Used  Substance Use Topics   Alcohol use: No    Alcohol/week: 0.0 standard drinks   Drug use: No     Family Hx: The patient's family history includes Cancer in her father; Congestive Heart Failure in her mother; Dementia in her mother; Diabetes in her brother, brother, brother, brother, sister, sister, sister, sister, and son; Heart attack in her father; Heart disease in her brother, brother, sister, and sister; Heart disease (age of onset: 37) in her mother; Hyperlipidemia in her brother, brother, and brother; Hypertension in her sister, sister, sister, and sister; Osteoporosis in her mother and sister. There is no history of Colon cancer, Rectal cancer, Stomach cancer, or Esophageal cancer.  ROS:   Please see the history of present illness.    As stated in the HPI and negative for all other systems.  Prior CV studies:   The following studies were reviewed today:  Lexiscan Myoview.  Labs/Other Tests and Data Reviewed:    EKG:  No ECG reviewed.  Recent Labs: 07/21/2018: ALT 11; BUN 16; Creatinine, Ser 0.82; Hemoglobin 14.5; Platelets 189; Potassium 4.4; Sodium 143   Recent Lipid Panel Lab Results  Component Value Date/Time     CHOL 191 07/21/2018 12:13 PM   CHOL 152 03/04/2013 12:12 PM   TRIG 122 07/21/2018 12:13 PM   TRIG 93 04/08/2016 11:58 AM   TRIG 97 03/04/2013 12:12 PM   HDL 67 07/21/2018 12:13 PM   HDL 68 04/08/2016 11:58 AM   HDL 62 03/04/2013 12:12 PM   CHOLHDL 2.9 07/21/2018 12:13 PM   LDLCALC 100 (H) 07/21/2018 12:13 PM   LDLCALC 75 01/27/2014 11:47 AM   LDLCALC 71 03/04/2013 12:12 PM    Wt Readings from Last 3 Encounters:  11/17/18 155 lb (70.3 kg)  10/14/18 156 lb (70.8 kg)  08/26/18 156 lb (  70.8 kg)     Objective:    Vital Signs:  Ht 5\' 3"  (1.6 m)    Wt 155 lb (70.3 kg)    BMI 27.46 kg/m      ASSESSMENT & PLAN:    SVT:  Is unclear whether her symptoms are related to this.  I am going to increase her Cardizem to 300 mg daily.  She will let me know if she has any improvement with this.    DYSLIPIDEMIA:     LDL most recently was 82 with an HDL of 56.  She will continue the meds as listed.   CHEST PAIN:    She had a negative Lexiscan Myoview last year.  This could be related to the SVT and I will manage as above.  COVID-19 Education: The signs and symptoms of COVID-19 were discussed with the patient and how to seek care for testing (follow up with PCP or arrange E-visit).  We discussed this at length.  The importance of social distancing was discussed today.  Time:   Today, I have spent 21 minutes with the patient with telehealth technology discussing the above problems.     Medication Adjustments/Labs and Tests Ordered: Current medicines are reviewed at length with the patient today.  Concerns regarding medicines are outlined above.   Tests Ordered: No orders of the defined types were placed in this encounter.   Medication Changes: No orders of the defined types were placed in this encounter.   Disposition:  Follow up 3 months  Signed, Minus Breeding, MD  11/17/2018 2:10 PM    [facility]

## 2018-11-17 ENCOUNTER — Encounter: Payer: Self-pay | Admitting: Cardiology

## 2018-11-17 ENCOUNTER — Telehealth (INDEPENDENT_AMBULATORY_CARE_PROVIDER_SITE_OTHER): Payer: Medicare HMO | Admitting: Cardiology

## 2018-11-17 VITALS — Ht 63.0 in | Wt 155.0 lb

## 2018-11-17 DIAGNOSIS — E785 Hyperlipidemia, unspecified: Secondary | ICD-10-CM | POA: Diagnosis not present

## 2018-11-17 DIAGNOSIS — R079 Chest pain, unspecified: Secondary | ICD-10-CM | POA: Insufficient documentation

## 2018-11-17 DIAGNOSIS — R0789 Other chest pain: Secondary | ICD-10-CM

## 2018-11-17 DIAGNOSIS — I471 Supraventricular tachycardia: Secondary | ICD-10-CM

## 2018-11-17 MED ORDER — DILTIAZEM HCL ER COATED BEADS 300 MG PO CP24: 300.0000 mg | ORAL_CAPSULE | Freq: Every day | ORAL | 3 refills | Status: DC

## 2018-11-17 MED ORDER — NITROGLYCERIN 0.4 MG/SPRAY TL SOLN: 3 refills | Status: DC

## 2018-11-17 NOTE — Patient Instructions (Signed)
Medication Instructions:  Please increase your Cardizem to 300 mg daily. Your Nitroglycerin spray has been refilled but please have the pharmacy contact us if you insurance doesn't cover it and you need the tablets ordered. Continue all other medications as listed.  If you need a refill on your cardiac medications before your next appointment, please call your pharmacy.   Follow-Up: Follow up in 6 months with Dr. Percival Spanish in [place].  You will receive a letter in the mail 2 months before you are due.  Please call us when you receive this letter to schedule your follow up appointment.  Thank you for choosing [facility]!!

## 2018-11-18 ENCOUNTER — Ambulatory Visit (INDEPENDENT_AMBULATORY_CARE_PROVIDER_SITE_OTHER): Payer: Medicare HMO | Admitting: Orthopedic Surgery

## 2018-11-22 ENCOUNTER — Ambulatory Visit (INDEPENDENT_AMBULATORY_CARE_PROVIDER_SITE_OTHER): Payer: Medicare HMO | Admitting: Family Medicine

## 2018-11-22 ENCOUNTER — Other Ambulatory Visit: Payer: Self-pay

## 2018-11-22 ENCOUNTER — Telehealth: Payer: Self-pay | Admitting: Family Medicine

## 2018-11-22 DIAGNOSIS — G5601 Carpal tunnel syndrome, right upper limb: Secondary | ICD-10-CM | POA: Diagnosis not present

## 2018-11-22 DIAGNOSIS — M810 Age-related osteoporosis without current pathological fracture: Secondary | ICD-10-CM | POA: Diagnosis not present

## 2018-11-22 DIAGNOSIS — I700 Atherosclerosis of aorta: Secondary | ICD-10-CM

## 2018-11-22 DIAGNOSIS — J441 Chronic obstructive pulmonary disease with (acute) exacerbation: Secondary | ICD-10-CM | POA: Diagnosis not present

## 2018-11-22 DIAGNOSIS — E559 Vitamin D deficiency, unspecified: Secondary | ICD-10-CM | POA: Diagnosis not present

## 2018-11-22 DIAGNOSIS — D509 Iron deficiency anemia, unspecified: Secondary | ICD-10-CM | POA: Diagnosis not present

## 2018-11-22 DIAGNOSIS — I10 Essential (primary) hypertension: Secondary | ICD-10-CM

## 2018-11-22 DIAGNOSIS — I471 Supraventricular tachycardia: Secondary | ICD-10-CM | POA: Diagnosis not present

## 2018-11-22 DIAGNOSIS — H3530 Unspecified macular degeneration: Secondary | ICD-10-CM

## 2018-11-22 DIAGNOSIS — R5383 Other fatigue: Secondary | ICD-10-CM

## 2018-11-22 DIAGNOSIS — R300 Dysuria: Secondary | ICD-10-CM

## 2018-11-22 MED ORDER — SULFAMETHOXAZOLE-TRIMETHOPRIM 800-160 MG PO TABS: 1.0000 | ORAL_TABLET | Freq: Two times a day (BID) | ORAL | 0 refills | Status: DC

## 2018-11-22 MED ORDER — ALBUTEROL SULFATE HFA 108 (90 BASE) MCG/ACT IN AERS: INHALATION_SPRAY | RESPIRATORY_TRACT | 2 refills | Status: DC

## 2018-11-22 NOTE — Telephone Encounter (Signed)
Patient notified that rxs sent to pharmacy

## 2018-11-22 NOTE — Telephone Encounter (Signed)
PT is wanting to speak to Roselyn Reef about her telephone visit this morning she had with Dr Laurance Flatten, said her antibiotic has not been sent in yet and wants to check on that.

## 2018-11-22 NOTE — Progress Notes (Signed)
Virtual Visit Via telephone Note I connected with@ on 11/22/18 by telephone and verified that I am speaking with the correct person or authorized healthcare agent using two identifiers. Krystal [name] is currently located at home and there are no unauthorized people in close proximity. I completed this visit while in a private location in my home .  I connected to the patient by telephone and verified that I was speaking with the correct person.  This visit type was conducted due to national recommendations for restrictions regarding the COVID-19 Pandemic (e.g. social distancing).  This format is felt to be most appropriate for this patient at this time.  All issues noted in this document were discussed and addressed.  No physical exam was performed.    I discussed the limitations, risks, security and privacy concerns of performing an evaluation and management service by telephone and the availability of in person appointments. I also discussed with the patient that there may be a patient responsible charge related to this service. The patient expressed understanding and agreed to proceed.   Date:  11/22/2018    ID:  Krystal [name]      1940/12/26        [id number]   Patient Care Team Patient Care Team: Chipper Herb, MD as PCP - General (Family Medicine) Milus Banister, MD (Gastroenterology) Deeann Saint, MD as Consulting Physician (Hematology and Oncology) Sherlynn Stalls, MD as Consulting Physician (Ophthalmology) Satira Sark, MD as Consulting Physician (Cardiology) Okey Regal, OD as Consulting Physician (Optometry)  Reason for Visit: Primary Care Follow-up     History of Present Illness & Review of Systems:     Krystal [name] is a 78 y.o. year old female primary care patient that presents today for a telehealth visit.  The patient was doing well and had no specific complaints other than some ongoing fullness in her chest and more recently for the  past 3 days some burning and increased urinary frequency.  She denies any shortness of breath anymore than usual.  She has her ongoing constipation but otherwise no change in her bowel habits or blood in the stool that she is aware of.  She is having urinary tract symptoms of burning and frequency for 3 days.  She plans to follow-up with the orthopedist regarding her carpal tunnel surgery and this seems to be doing better with only some slight tingling in her fingers currently.  This was by Dr. Sharol Given.  Patient also says that she needs a refill on her pro-air inhaler.  Review of systems as stated otherwise negative for body systems left unmentioned.   The patient does not have symptoms concerning for COVID-19 infection (fever, chills, cough, or new shortness of breath).      Current Medications (Verified) Allergies as of 11/22/2018      Reactions   Actonel [risedronate] Nausea And Vomiting, Other (See Comments)   Throat tightness   Fosamax [alendronate Sodium] Other (See Comments)   esophagitis   Mevacor [lovastatin] Other (See Comments)   myalgias   Miacalcin [calcitonin (salmon)] Nausea And Vomiting   Mobic [meloxicam] Hives, Itching   rash      Medication List       Accurate as of November 22, 2018 10:57 AM. Always use your most recent med list.        aspirin EC 81 MG tablet Take 81 mg by mouth at bedtime.   Calcium Citrate-Vitamin D 315-250 MG-UNIT Tabs Commonly  known as:  Calcium Citrate + D Take 2 tablets by mouth daily.   cetirizine 10 MG tablet Commonly known as:  ZYRTEC Take 5-10 mg by mouth at bedtime.   diazepam 5 MG tablet Commonly known as:  VALIUM TAKE 1 TABLET BY MOUTH EVERY 6 HOURS AS NEEDED   diltiazem 300 MG 24 hr capsule Commonly known as:  CARDIZEM CD Take 1 capsule (300 mg total) by mouth daily.   esomeprazole 40 MG capsule Commonly known as:  NEXIUM TAKE 1 CAPSULE (40 MG TOTAL) BY MOUTH DAILY.   ferrous sulfate 325 (65 FE) MG tablet Take 325 mg by  mouth at bedtime.   ipratropium-albuterol 0.5-2.5 (3) MG/3ML Soln Commonly known as:  DUONEB Take 3 mLs by nebulization every 6 (six) hours as needed. Dx:  Asthma 493.90   montelukast 10 MG tablet Commonly known as:  SINGULAIR TAKE 1 TABLET (10 MG TOTAL) BY MOUTH AT BEDTIME.   nitroGLYCERIN 0.4 MG/SPRAY spray Commonly known as:  NITROLINGUAL ONE SPRAY UNDER TONGUE AS NEED FOR CHESTPAIN. MAY REPEAT IN 5 MINUTES IF PAIN NOT RELIEVED CALL 911.   PRESERVISION/LUTEIN PO Take 1 capsule by mouth at bedtime.   ProAir HFA 108 (90 Base) MCG/ACT inhaler Generic drug:  albuterol INHALE TWO PUFFS BY MOUTH 4 TIMES DAILY AS NEEDED   Prolia 60 MG/ML Sosy injection Generic drug:  denosumab TO BE ADMINISTERED IN PHYSICIAN'S OFFICE. INJECT ONE SYRINGE SUBCOUSLY ONCE EVERY 6 MONTHS. REFRIGERATE. USE WITHIN 14 DAYS ONCE AT ROOM TEMPERATURE.   Restasis 0.05 % ophthalmic emulsion Generic drug:  cycloSPORINE Place 1 drop into both eyes 2 (two) times daily.   simvastatin 10 MG tablet Commonly known as:  ZOCOR TAKE ONE TABLET BY MOUTH ONE TIME DAILY   Vitamin D 50 MCG (2000 UT) Caps Take 1 capsule by mouth at bedtime.           Allergies (Verified)    Actonel [risedronate]; Fosamax [alendronate sodium]; Mevacor [lovastatin]; Miacalcin [calcitonin (salmon)]; and Mobic [meloxicam]  Past Medical History Past Medical History:  Diagnosis Date  . Anemia   . Asthma   . Brain tumor ([place])    1986  . Breast cancer ([place])    Right breast  . Cataract   . Colon polyps   . COPD (chronic obstructive pulmonary disease) ([place])   . Hiatal hernia   . Hyperlipidemia   . Leg edema   . Macular degeneration   . Osteoporosis   . Pneumonia   . PVD (posterior vitreous detachment)   . Right knee pain   . Shingles rash      Past Surgical History:  Procedure Laterality Date  . CATARACT EXTRACTION, BILATERAL    . COLONOSCOPY    . CRANIECTOMY / CRANIOTOMY FOR EXCISION OF BRAIN TUMOR     Left side brain   . MASTECTOMY Right   . PARTIAL HYSTERECTOMY    . POLYPECTOMY    . SKIN CANCER EXCISION Left    Under left eye    Social History   Socioeconomic History  . Marital status: Widowed    Spouse name: Not on file  . Number of children: 1  . Years of education: 74th  . Highest education level: 7th grade  Occupational History  . Occupation: retired  Scientific laboratory technician  . Financial resource strain: Not hard at all  . Food insecurity:    Worry: Never true    Inability: Never true  . Transportation needs:    Medical: No    Non-medical:  No  Tobacco Use  . Smoking status: Former Smoker    Types: Cigarettes    Last attempt to quit: 02/09/1961    Years since quitting: 57.8  . Smokeless tobacco: Never Used  Substance and Sexual Activity  . Alcohol use: No    Alcohol/week: 0.0 standard drinks  . Drug use: No  . Sexual activity: Never  Lifestyle  . Physical activity:    Days per week: 3 days    Minutes per session: 40 min  . Stress: Not on file  Relationships  . Social connections:    Talks on phone: Not on file    Gets together: Not on file    Attends religious service: Not on file    Active member of club or organization: Not on file    Attends meetings of clubs or organizations: Not on file    Relationship status: Not on file  Other Topics Concern  . Not on file  Social History Narrative   Son lives with patient.   Retired from farming.   Highest level of education:  7th     Family History  Problem Relation Age of Onset  . Heart disease Mother 34       Stroke   . Dementia Mother   . Congestive Heart Failure Mother   . Osteoporosis Mother   . Heart disease Sister   . Hypertension Sister   . Diabetes Sister   . Osteoporosis Sister   . Heart disease Brother   . Hyperlipidemia Brother   . Diabetes Brother   . Heart disease Sister   . Hypertension Sister   . Diabetes Sister   . Diabetes Sister   . Hypertension Sister   . Diabetes Sister   . Hypertension Sister   .  Diabetes Brother   . Hyperlipidemia Brother   . Cancer Father        lung  . Heart attack Father   . Diabetes Brother   . Heart disease Brother   . Hyperlipidemia Brother   . Diabetes Brother   . Diabetes Son   . Colon cancer Neg Hx   . Rectal cancer Neg Hx   . Stomach cancer Neg Hx   . Esophageal cancer Neg Hx       Labs/Other Tests and Data Reviewed:    Wt Readings from Last 3 Encounters:  11/17/18 155 lb (70.3 kg)  10/14/18 156 lb (70.8 kg)  08/26/18 156 lb (70.8 kg)   Temp Readings from Last 3 Encounters:  08/21/18 (!) 96.9 F (36.1 C) (Oral)  07/21/18 (!) 96.8 F (36 C) (Oral)  03/06/18 (!) 97.2 F (36.2 C) (Oral)   BP Readings from Last 3 Encounters:  08/21/18 (!) 106/57  07/21/18 (!) 95/59  03/06/18 111/63   Pulse Readings from Last 3 Encounters:  08/21/18 74  07/21/18 88  03/06/18 82     No results found for: HGBA1C Lab Results  Component Value Date   LDLCALC 100 (H) 07/21/2018   CREATININE 0.82 07/21/2018       Chemistry      Component Value Date/Time   NA 143 07/21/2018 1213   K 4.4 07/21/2018 1213   CL 101 07/21/2018 1213   CO2 26 07/21/2018 1213   BUN 16 07/21/2018 1213   CREATININE 0.82 07/21/2018 1213   CREATININE 0.75 03/04/2013 1212      Component Value Date/Time   CALCIUM 9.9 07/21/2018 1213   ALKPHOS 44 07/21/2018 1213   AST 18 07/21/2018 1213  ALT 11 07/21/2018 1213   BILITOT 0.5 07/21/2018 1213         OBSERVATIONS/ OBJECTIVE:     The patient was alert and provided vital signs from home.  Her weight was 156 and she said this was stable.  Her blood pressure was 127/79.  Her temperature was 97.7.  Her O2 level was 96%.  Her heart rate was 87.  The patient was alert and answered all questions appropriately with very valuable information.  She understands that she needs follow-up visits with cardiology and with orthopedics.  She will continue to practice good respiratory and hand hygiene.  Physical exam deferred due to  nature of telephonic visit.  ASSESSMENT & PLAN    Time:   Today, I have spent 22 minutes with the patient via telephone discussing the above including Covid precautions.     Visit Diagnoses: 1. PSVT (paroxysmal supraventricular tachycardia) ([place]) -Should continue to follow-up with cardiology and get started on the increased dose of cardia zyme as he recommended to see if this will help her chest tightness.  2. Age-related osteoporosis without current pathological fracture -Continue with Prolia injections  3. Vitamin D deficiency -Continue with vitamin D replacement pending results of lab work  4. Thoracic aortic atherosclerosis ([place]) -Continue with simvastatin and as aggressive therapeutic lifestyle changes as possible including diet and exercise  5. Essential hypertension -Home blood pressures were good running about 127/79.  She should continue with current treatment.  She should check blood pressures a little more regularly since she is going to be increasing the cardia zyme as recommended by the cardiologist.  6. Iron deficiency anemia, unspecified iron deficiency anemia type -She is currently taking her iron every other day and when she comes in for blood work we will get a CBC and serum iron level.  7. Abdominal aortic atherosclerosis ([place]) -Continue with simvastatin and with as aggressive therapeutic lifestyle changes as possible including diet and exercise  8. Carpal tunnel syndrome on right -Follow-up with orthopedic surgeon as planned  9. COPD exacerbation ([place]) -Continue with inhalers and check O2 as needed  10. Macular degeneration of both eyes, unspecified type -Follow-up with ophthalmology as planned  11.  Urinary frequency -Refill antibiotic at [facility] and check urinalysis with lab work in a couple weeks when antibiotic is completed   Meds ordered this encounter  Medications  . albuterol (PROAIR HFA) 108 (90 Base) MCG/ACT inhaler    Sig: INHALE TWO PUFFS BY  MOUTH 4 TIMES DAILY AS NEEDED    Dispense:  9 g    Refill:  2   Patient Instructions  Continue to practice good hand and respiratory hygiene Continue to drink plenty of water and fluids Follow-up with cardiology as planned in 3 months in [place]. Continue take additional medicine as recommended by cardiology Make all efforts to not put herself at risk for falling, avoid climbing and avoid doing heavy lifting. Diminish caffeine in the diet Patient should come by the office in a couple weeks after completing a round of antibiotics and get blood work that would include her routine lab work in addition to a repeat urinalysis CBC serum iron thyroid along with cholesterol liver and renal.       The above assessment and management plan was discussed with the patient. The patient verbalized understanding of and has agreed to the management plan. Patient is aware to call the clinic if symptoms persist or worsen. Patient is aware when to return to the clinic for a  follow-up visit. Patient educated on when it is appropriate to go to the emergency department.    Chipper Herb, MD [address], [place], [place] [address] Ph [phone]   Arrie Senate MD

## 2018-11-22 NOTE — Telephone Encounter (Signed)
Pt called and aware of RX that was sent recently

## 2018-11-22 NOTE — Addendum Note (Signed)
Addended by: Zannie Cove on: 11/22/2018 01:04 PM   Modules accepted: Orders

## 2018-11-22 NOTE — Patient Instructions (Addendum)
Continue to practice good hand and respiratory hygiene Continue to drink plenty of water and fluids Follow-up with cardiology as planned in 3 months in [place]. Continue take additional medicine as recommended by cardiology Make all efforts to not put herself at risk for falling, avoid climbing and avoid doing heavy lifting. Diminish caffeine in the diet Patient should come by the office in a couple weeks after completing a round of antibiotics and get blood work that would include her routine lab work in addition to a repeat urinalysis CBC serum iron thyroid along with cholesterol liver and renal.

## 2018-12-07 ENCOUNTER — Other Ambulatory Visit: Payer: Self-pay | Admitting: Family Medicine

## 2018-12-13 ENCOUNTER — Telehealth: Payer: Self-pay | Admitting: Family Medicine

## 2018-12-13 DIAGNOSIS — C50911 Malignant neoplasm of unspecified site of right female breast: Secondary | ICD-10-CM

## 2018-12-13 NOTE — Telephone Encounter (Signed)
DME order written and pt aware - will fax

## 2018-12-17 DIAGNOSIS — C50911 Malignant neoplasm of unspecified site of right female breast: Secondary | ICD-10-CM | POA: Diagnosis not present

## 2019-01-18 ENCOUNTER — Telehealth: Payer: Self-pay | Admitting: *Deleted

## 2019-01-26 DIAGNOSIS — H353212 Exudative age-related macular degeneration, right eye, with inactive choroidal neovascularization: Secondary | ICD-10-CM | POA: Diagnosis not present

## 2019-01-26 DIAGNOSIS — H43813 Vitreous degeneration, bilateral: Secondary | ICD-10-CM | POA: Diagnosis not present

## 2019-01-26 DIAGNOSIS — H43392 Other vitreous opacities, left eye: Secondary | ICD-10-CM | POA: Diagnosis not present

## 2019-01-26 DIAGNOSIS — H353124 Nonexudative age-related macular degeneration, left eye, advanced atrophic with subfoveal involvement: Secondary | ICD-10-CM | POA: Diagnosis not present

## 2019-01-31 ENCOUNTER — Telehealth: Payer: Self-pay | Admitting: Family Medicine

## 2019-01-31 DIAGNOSIS — Z7409 Other reduced mobility: Secondary | ICD-10-CM

## 2019-01-31 DIAGNOSIS — H53413 Scotoma involving central area, bilateral: Secondary | ICD-10-CM | POA: Diagnosis not present

## 2019-02-01 NOTE — Telephone Encounter (Signed)
OT notified of order for bedside commode Will be signed when Dr Laurance Flatten comes in to clinic

## 2019-02-02 ENCOUNTER — Ambulatory Visit (INDEPENDENT_AMBULATORY_CARE_PROVIDER_SITE_OTHER): Payer: Medicare HMO | Admitting: *Deleted

## 2019-02-02 ENCOUNTER — Other Ambulatory Visit: Payer: Self-pay

## 2019-02-02 DIAGNOSIS — M810 Age-related osteoporosis without current pathological fracture: Secondary | ICD-10-CM | POA: Diagnosis not present

## 2019-02-02 MED ORDER — DENOSUMAB 60 MG/ML ~~LOC~~ SOSY
60.0000 mg | PREFILLED_SYRINGE | Freq: Once | SUBCUTANEOUS | Status: AC
  Administered 2019-02-02: 60 mg via SUBCUTANEOUS

## 2019-02-02 NOTE — Progress Notes (Signed)
Pt given Prolia inj Medication was pt supplied Pt tolerated well Next due after 08/06/2019

## 2019-02-04 ENCOUNTER — Ambulatory Visit: Payer: Medicare HMO

## 2019-02-07 ENCOUNTER — Other Ambulatory Visit: Payer: Self-pay

## 2019-02-07 ENCOUNTER — Ambulatory Visit (INDEPENDENT_AMBULATORY_CARE_PROVIDER_SITE_OTHER): Payer: Medicare HMO | Admitting: Physician Assistant

## 2019-02-07 ENCOUNTER — Encounter: Payer: Self-pay | Admitting: Orthopedic Surgery

## 2019-02-07 ENCOUNTER — Ambulatory Visit: Payer: Medicare HMO

## 2019-02-07 VITALS — Ht 63.0 in | Wt 155.0 lb

## 2019-02-07 DIAGNOSIS — M5412 Radiculopathy, cervical region: Secondary | ICD-10-CM | POA: Diagnosis not present

## 2019-02-07 MED ORDER — PREDNISONE 10 MG PO TABS: 20.0000 mg | ORAL_TABLET | Freq: Every day | ORAL | 0 refills | Status: DC

## 2019-02-07 NOTE — Progress Notes (Signed)
Office Visit Note   Patient: Krystal [name]           Date of Birth: September 25, 1940           MRN: [id number] Visit Date: 02/07/2019              Requested by: Chipper Herb, MD No address on file PCP: Chipper Herb, MD  Chief Complaint  Patient presents with  . Right Wrist - Follow-up      HPI: The patient is a 78 yo woman who is seen for evaluation of right upper extremity pain and numbness and tingling . She reports she has noticed numbness and tingling for the past couple of years. She feels like it starts at the elbow and radiates down into the hand and fingers. She reports a burning type pain. She also feels like she is getting weak in her right arm. She is left hand dominant. She had right carpal tunnel release earlier this year, but does not feel anything is changed following surgery.   Assessment & Plan: Visit Diagnoses:  1. Radiculopathy, cervical region     Plan: Will begin Prednisone 20 mg daily at breakfast. Follow up in 2 weeks.  May need MRI scan to further assess if signs and symptoms persist.   Follow-Up Instructions: Return in about 2 weeks (around 02/21/2019).   Ortho Exam  Patient is alert, oriented, no adenopathy, well-dressed, normal affect, normal respiratory effort. Patient indicates numbness with palpation over all fingers and medial > lateral forearm. She has weakness with right grip , wrist flexion and extension and biceps and triceps and deltoid are ~ 4/5. Left upper extremity is 5/5 and sensation intact.  Good pulses in the right upper extremity.   Imaging: No results found. No images are attached to the encounter.  Labs: Lab Results  Component Value Date   ESRSEDRATE 17 06/22/2017   CRP 0.2 (L) 06/22/2017   REPTSTATUS 11/03/2015 FINAL 10/29/2015   CULT NO GROWTH 5 DAYS 10/29/2015     Lab Results  Component Value Date   ALBUMIN 4.5 07/21/2018   ALBUMIN 4.2 02/25/2018   ALBUMIN 4.4 10/08/2017    Lab Results  Component Value  Date   MG 2.0 10/18/2014   Lab Results  Component Value Date   VD25OH 37.1 07/21/2018   VD25OH 43.5 02/25/2018   VD25OH 49.0 10/08/2017    No results found for: PREALBUMIN CBC EXTENDED Latest Ref Rng & Units 07/21/2018 02/25/2018 10/08/2017  WBC 3.4 - 10.8 x10E3/uL 5.9 5.7 5.9  RBC 3.77 - 5.28 x10E6/uL 4.81 4.53 4.86  HGB 11.1 - 15.9 g/dL 14.5 13.6 14.9  HCT 34.0 - 46.6 % 42.9 41.7 44.2  PLT 150 - 450 x10E3/uL 189 160 178  NEUTROABS 1.4 - 7.0 x10E3/uL 3.8 3.7 3.6  LYMPHSABS 0.7 - 3.1 x10E3/uL 1.5 1.3 1.6     Body mass index is 27.46 kg/m.  Orders:  Orders Placed This Encounter  Procedures  . XR Cervical Spine 2 or 3 views   Meds ordered this encounter  Medications  . predniSONE (DELTASONE) 10 MG tablet    Sig: Take 2 tablets (20 mg total) by mouth daily with breakfast.    Dispense:  60 tablet    Refill:  0     Procedures: No procedures performed  Clinical Data: No additional findings.  ROS:  All other systems negative, except as noted in the HPI. Review of Systems  Objective: Vital Signs: Ht 5\' 3"  (1.6 m)  Wt 155 lb (70.3 kg)   BMI 27.46 kg/m   Specialty Comments:  No specialty comments available.  PMFS History: Patient Active Problem List   Diagnosis Date Noted  . Dyslipidemia 11/17/2018  . Chest pain of uncertain etiology 89/38/1017  . Carpal tunnel syndrome of right wrist 03/25/2018  . Influenza with pneumonia 10/30/2015  . CAP (community acquired pneumonia) 10/29/2015  . Abdominal aortic atherosclerosis ([place]) 04/10/2015  . PSVT (paroxysmal supraventricular tachycardia) ([place]) 05/10/2014  . Macular degeneration, age related, nonexudative 08/24/2013  . History of right mastectomy 07/13/2013  . Anemia, iron deficiency 06/21/2013  . Allergic rhinitis 06/21/2013  . Chronic low back pain 06/21/2013  . Malignant neoplasm of female breast ([place]) 11/22/2007  . Mixed hyperlipidemia 11/22/2007  . Anxiety state 11/22/2007  . DEPRESSION 11/22/2007  .  Asthma 11/22/2007  . GERD 11/22/2007  . GASTRITIS, WITH HEMORRHAGE 08/14/2007  . DUODENITIS, WITHOUT HEMORRHAGE 08/14/2007  . HIATAL HERNIA 08/14/2007  . 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   Past Medical History:  Diagnosis Date  . Anemia   . Asthma   . Brain tumor ([place])    1986  . Breast cancer ([place])    Right breast  . Cataract   . Colon polyps   . COPD (chronic obstructive pulmonary disease) ([place])   . Hiatal hernia   . Hyperlipidemia   . Leg edema   . Macular degeneration   . Osteoporosis   . Pneumonia   . PVD (posterior vitreous detachment)   . Right knee pain   . Shingles rash     Family History  Problem Relation Age of Onset  . Heart disease Mother 38       Stroke   . Dementia Mother   . Congestive Heart Failure Mother   . Osteoporosis Mother   . Heart disease Sister   . Hypertension Sister   . Diabetes Sister   . Osteoporosis Sister   . Heart disease Brother   . Hyperlipidemia Brother   . Diabetes Brother   . Heart disease Sister   . Hypertension Sister   . Diabetes Sister   . Diabetes Sister   . Hypertension Sister   . Diabetes Sister   . Hypertension Sister   . Diabetes Brother   . Hyperlipidemia Brother   . Cancer Father        lung  . Heart attack Father   . Diabetes Brother   . Heart disease Brother   . Hyperlipidemia Brother   . Diabetes Brother   . Diabetes Son   . Colon cancer Neg Hx   . Rectal cancer Neg Hx   . Stomach cancer Neg Hx   . Esophageal cancer Neg Hx     Past Surgical History:  Procedure Laterality Date  . CATARACT EXTRACTION, BILATERAL    . COLONOSCOPY    . CRANIECTOMY / CRANIOTOMY FOR EXCISION OF BRAIN TUMOR     Left side brain  . MASTECTOMY Right   . PARTIAL HYSTERECTOMY    . POLYPECTOMY    . SKIN CANCER EXCISION Left    Under left eye   Social History   Occupational History  . Occupation: retired  Tobacco Use  . Smoking status: Former Smoker    Types: Cigarettes    Quit date: 02/09/1961    Years since  quitting: 58.0  . Smokeless tobacco: Never Used  Substance and Sexual Activity  . Alcohol use: No    Alcohol/week: 0.0 standard drinks  . Drug use: No  .  Sexual activity: Never

## 2019-02-08 ENCOUNTER — Telehealth: Payer: Self-pay | Admitting: Family Medicine

## 2019-02-08 ENCOUNTER — Encounter: Payer: Self-pay | Admitting: *Deleted

## 2019-02-08 DIAGNOSIS — R531 Weakness: Secondary | ICD-10-CM

## 2019-02-08 DIAGNOSIS — H53413 Scotoma involving central area, bilateral: Secondary | ICD-10-CM | POA: Diagnosis not present

## 2019-02-08 NOTE — Telephone Encounter (Signed)
Patient was last seen 11/22/18 by [place]- Covering PCP- Please advise if patient needs to be seen or rx can be sent in.

## 2019-02-09 ENCOUNTER — Telehealth: Payer: Self-pay | Admitting: Family Medicine

## 2019-02-09 NOTE — Telephone Encounter (Signed)
DX codes given to [facility]

## 2019-02-10 DIAGNOSIS — Z7409 Other reduced mobility: Secondary | ICD-10-CM | POA: Diagnosis not present

## 2019-02-10 DIAGNOSIS — H3530 Unspecified macular degeneration: Secondary | ICD-10-CM | POA: Diagnosis not present

## 2019-02-10 DIAGNOSIS — C50911 Malignant neoplasm of unspecified site of right female breast: Secondary | ICD-10-CM | POA: Diagnosis not present

## 2019-02-10 NOTE — Telephone Encounter (Signed)
rx written- has to come in and pick up and take where she wants to.

## 2019-02-10 NOTE — Telephone Encounter (Signed)
Rx has been faxed to [facility]. °

## 2019-02-14 ENCOUNTER — Telehealth: Payer: Self-pay | Admitting: *Deleted

## 2019-02-14 NOTE — Progress Notes (Signed)
Cardiology Office Note   Date:  02/16/2019   ID:  Krystal [name], DOB 1940/12/26, MRN [id number]  PCP:  Dettinger, Fransisca Kaufmann, MD  Cardiologist:   No primary care provider on file.   Chief Complaint  Patient presents with  . Shortness of Breath      History of Present Illness: Krystal [name] is a 78 y.o. female who presents for follow up of SVT.    Since I last saw her she does get complications.  This happens about every 3 days or so.  She has to go lie down.  She will have a resolution of this and 30 minutes or so.  He says this is previous to her similar palpitations.  She has not been getting worse.  She is not getting any presyncope or syncope.  She denies any chest pressure, neck or arm discomfort.  She does have some increased shortness of breath with physical activity.  She has noticed this more.  He says his activities he used to not be exerting such as light housework and now causing some shortness of breath.  Is not having any resting shortness of breath, PND or orthopnea.  She has had no weight gain or edema.   Past Medical History:  Diagnosis Date  . Anemia   . Asthma   . Brain tumor ([place])    1986  . Breast cancer ([place])    Right breast  . Cataract   . Colon polyps   . COPD (chronic obstructive pulmonary disease) ([place])   . Hiatal hernia   . Hyperlipidemia   . Leg edema   . Macular degeneration   . Osteoporosis   . Pneumonia   . PVD (posterior vitreous detachment)   . Right knee pain   . Shingles rash     Past Surgical History:  Procedure Laterality Date  . CATARACT EXTRACTION, BILATERAL    . COLONOSCOPY    . CRANIECTOMY / CRANIOTOMY FOR EXCISION OF BRAIN TUMOR     Left side brain  . MASTECTOMY Right   . PARTIAL HYSTERECTOMY    . POLYPECTOMY    . SKIN CANCER EXCISION Left    Under left eye     Current Outpatient Medications  Medication Sig Dispense Refill  . albuterol (PROAIR HFA) 108 (90 Base) MCG/ACT inhaler INHALE TWO PUFFS BY MOUTH 4  TIMES DAILY AS NEEDED 9 g 2  . aspirin EC 81 MG tablet Take 81 mg by mouth at bedtime.    . Calcium Citrate-Vitamin D (CALCIUM CITRATE + D) 315-250 MG-UNIT TABS Take 2 tablets by mouth daily. 120 tablet   . cetirizine (ZYRTEC) 10 MG tablet Take 5-10 mg by mouth at bedtime.     . Cholecalciferol (VITAMIN D) 2000 UNITS CAPS Take 1 capsule by mouth at bedtime.    . diazepam (VALIUM) 5 MG tablet TAKE 1 TABLET BY MOUTH EVERY 6 HOURS AS NEEDED 30 tablet 4  . diltiazem (CARDIZEM CD) 300 MG 24 hr capsule Take 1 capsule (300 mg total) by mouth daily. 90 capsule 3  . esomeprazole (NEXIUM) 40 MG capsule TAKE 1 CAPSULE BY MOUTH EVERY DAY 90 capsule 0  . ferrous sulfate 325 (65 FE) MG tablet Take 325 mg by mouth at bedtime.    Marland Kitchen ipratropium-albuterol (DUONEB) 0.5-2.5 (3) MG/3ML SOLN Take 3 mLs by nebulization every 6 (six) hours as needed. Dx:  Asthma 493.90 360 mL 2  . montelukast (SINGULAIR) 10 MG tablet TAKE 1 TABLET BY MOUTH  EVERYDAY AT BEDTIME 90 tablet 0  . Multiple Vitamins-Minerals (PRESERVISION/LUTEIN PO) Take 1 capsule by mouth at bedtime.     . nitroGLYCERIN (NITROLINGUAL) 0.4 MG/SPRAY spray ONE SPRAY UNDER TONGUE AS NEED FOR CHESTPAIN. MAY REPEAT IN 5 MINUTES IF PAIN NOT RELIEVED CALL 911. 12 g 3  . predniSONE (DELTASONE) 10 MG tablet Take 2 tablets (20 mg total) by mouth daily with breakfast. 60 tablet 0  . PROLIA 60 MG/ML SOSY injection TO BE ADMINISTERED IN PHYSICIAN'S OFFICE. INJECT ONE SYRINGE SUBCOUSLY ONCE EVERY 6 MONTHS. REFRIGERATE. USE WITHIN 14 DAYS ONCE AT ROOM TEMPERATURE. 1 each 0  . RESTASIS 0.05 % ophthalmic emulsion Place 1 drop into both eyes 2 (two) times daily.  4  . simvastatin (ZOCOR) 10 MG tablet TAKE 1 TABLET BY MOUTH EVERY DAY 90 tablet 0  . metoprolol tartrate (LOPRESSOR) 25 MG tablet Take 1 tablet (25 mg total) by mouth daily as needed. 30 tablet 6   No current facility-administered medications for this visit.     Allergies:   Actonel [risedronate], Fosamax  [alendronate sodium], Mevacor [lovastatin], Miacalcin [calcitonin (salmon)], and Mobic [meloxicam]    ROS:  Please see the history of present illness.   Otherwise, review of systems are positive for none.   All other systems are reviewed and negative.    PHYSICAL EXAM: VS:  BP 128/65   Pulse 81   Ht 5\' 3"  (1.6 m)   Wt 166 lb (75.3 kg)   BMI 29.41 kg/m  , BMI Body mass index is 29.41 kg/m. GENERAL:  Well appearing NECK:  No jugular venous distention, waveform within normal limits, carotid upstroke brisk and symmetric, no bruits, no thyromegaly LUNGS:  Clear to auscultation bilaterally CHEST:  Unremarkable HEART:  PMI not displaced or sustained,S1 and S2 within normal limits, no S3, no S4, no clicks, no rubs, 2 out of 6 apical systolic murmur radiating slightly at the aortic outflow tract and early peaking, no diastolic murmurs ABD:  Flat, positive bowel sounds normal in frequency in pitch, no bruits, no rebound, no guarding, no midline pulsatile mass, no hepatomegaly, no splenomegaly EXT:  2 plus pulses throughout, no edema, no cyanosis no clubbing   EKG:  EKG is ordered today. The ekg ordered today demonstrates sinus rhythm, rate 81, axis within normal limits, intervals within normal limits, no acute ST-T wave changes.  Low voltage in the limb leads.   Recent Labs: 07/21/2018: ALT 11; BUN 16; Creatinine, Ser 0.82; Hemoglobin 14.5; Platelets 189; Potassium 4.4; Sodium 143    Lipid Panel    Component Value Date/Time   CHOL 191 07/21/2018 1213   CHOL 152 03/04/2013 1212   TRIG 122 07/21/2018 1213   TRIG 93 04/08/2016 1158   TRIG 97 03/04/2013 1212   HDL 67 07/21/2018 1213   HDL 68 04/08/2016 1158   HDL 62 03/04/2013 1212   CHOLHDL 2.9 07/21/2018 1213   LDLCALC 100 (H) 07/21/2018 1213   LDLCALC 75 01/27/2014 1147   LDLCALC 71 03/04/2013 1212      Wt Readings from Last 3 Encounters:  02/16/19 166 lb (75.3 kg)  02/07/19 155 lb (70.3 kg)  11/17/18 155 lb (70.3 kg)       Other studies Reviewed: Additional studies/ records that were reviewed today include: None. Review of the above records demonstrates:  Please see elsewhere in the note.     ASSESSMENT AND PLAN:  SVT:   The patient will take as needed metoprolol.  She will let me know if  this gets worse.  She might need another event monitor.   She has not worn one since 2015.    DYSLIPIDEMIA:    LDL was 100 with HDL of 67.  This is an excellent ratio.  No change in therapy.   DYSPNEA:    Given the increased severity of this further testing is indicated.  She would not be able to walk on a treadmill.  She will need a [employer].     Current medicines are reviewed at length with the patient today.  The patient does not have concerns regarding medicines.  The following changes have been made: As above.   Labs/ tests ordered today include:   Orders Placed This Encounter  Procedures  . MYOCARDIAL PERFUSION IMAGING  . EKG 12-Lead     Disposition:   FU with me in 3 months.     Signed, Minus Breeding, MD  02/16/2019 2:17 PM    [facility]

## 2019-02-14 NOTE — Telephone Encounter (Signed)
    COVID-19 Pre-Screening Questions:  . In the past 7 to 10 days have you had a cough,  shortness of breath, headache, congestion, fever (100 or greater) body aches, chills, sore throat, or sudden loss of taste or sense of smell? . Have you been around anyone with known Covid 19. . Have you been around anyone who is awaiting Covid 19 test results in the past 7 to 10 days? . Have you been around anyone who has been exposed to Covid 19, or has mentioned symptoms of Covid 19 within the past 7 to 10 days?  If you have any concerns/questions about symptoms patients report during screening (either on the phone or at threshold). Contact the provider seeing the patient or DOD for further guidance.  If neither are available contact a member of the leadership team.        Patient answered no to above questions. She will wear a mask, come only 15 min early and call us if her health changes before Wednesday. CP/cma

## 2019-02-15 DIAGNOSIS — H53413 Scotoma involving central area, bilateral: Secondary | ICD-10-CM | POA: Diagnosis not present

## 2019-02-16 ENCOUNTER — Encounter: Payer: Self-pay | Admitting: Cardiology

## 2019-02-16 ENCOUNTER — Other Ambulatory Visit: Payer: Self-pay

## 2019-02-16 ENCOUNTER — Ambulatory Visit (INDEPENDENT_AMBULATORY_CARE_PROVIDER_SITE_OTHER): Payer: Medicare HMO | Admitting: Cardiology

## 2019-02-16 VITALS — BP 128/65 | HR 81 | Ht 63.0 in | Wt 166.0 lb

## 2019-02-16 DIAGNOSIS — I471 Supraventricular tachycardia: Secondary | ICD-10-CM | POA: Diagnosis not present

## 2019-02-16 DIAGNOSIS — R079 Chest pain, unspecified: Secondary | ICD-10-CM

## 2019-02-16 DIAGNOSIS — R0789 Other chest pain: Secondary | ICD-10-CM

## 2019-02-16 MED ORDER — METOPROLOL TARTRATE 25 MG PO TABS: 25.0000 mg | ORAL_TABLET | Freq: Every day | ORAL | 6 refills | Status: DC | PRN

## 2019-02-16 NOTE — Patient Instructions (Addendum)
Medication Instructions:  You may take Metoprolol 25 mg as needed for palpitations. Continue all other medications as listed.  If you need a refill on your cardiac medications before your next appointment, please call your pharmacy.   Testing/Procedures: Your physician has requested that you have a lexiscan myoview. For further information please visit [email or web address]. Please follow instruction sheet, as given.  Follow-Up: Follow up in 3 months with Dr Percival Spanish in [place].  Thank you for choosing [facility]!!

## 2019-02-22 ENCOUNTER — Ambulatory Visit: Payer: Self-pay

## 2019-02-22 ENCOUNTER — Ambulatory Visit (INDEPENDENT_AMBULATORY_CARE_PROVIDER_SITE_OTHER): Payer: Medicare HMO | Admitting: Orthopedic Surgery

## 2019-02-22 ENCOUNTER — Encounter: Payer: Self-pay | Admitting: Orthopedic Surgery

## 2019-02-22 ENCOUNTER — Other Ambulatory Visit: Payer: Self-pay

## 2019-02-22 VITALS — Ht 63.0 in | Wt 166.0 lb

## 2019-02-22 DIAGNOSIS — M5412 Radiculopathy, cervical region: Secondary | ICD-10-CM

## 2019-02-22 DIAGNOSIS — M79601 Pain in right arm: Secondary | ICD-10-CM

## 2019-02-22 NOTE — Progress Notes (Signed)
Office Visit Note   Patient: Krystal [name]           Date of Birth: [birth year]           MRN: [id number] Visit Date: 02/22/2019              Requested by: Chipper Herb, MD [address] [place],  [place] [address] PCP: Dettinger, Fransisca Kaufmann, MD  Chief Complaint  Patient presents with  . Right Forearm - Pain      HPI: The patient is a 78 yo woman who is seen in follow-up for right upper extremity shooting pain and numbness and tingling.  She reports this is been radiating from her bicep area down to her wrist.  Does not involve her fingers.  Weakness in her hand.  No known injury to her neck does have some mild neck pain.   She reports a burning type pain. She also feels like she is getting weak in her right arm. She is left hand dominant. She had right carpal tunnel release earlier this year, but does not feel anything is changed following surgery.   Assessment & Plan: Visit Diagnoses:  1. Right arm pain   2. Radiculopathy, cervical region     Plan: No relief with prednisone.  Discussed anti-inflammatories.  Patient unable to take these due to previous ulcer.  She will be set up for an MRI of her neck we will call her with the results as it is difficult for her to get here from [place].  Follow-Up Instructions: No follow-ups on file.   Right Hand Exam   Tenderness  The patient is experiencing no tenderness.   Muscle Strength  Grip: 3/5    Left Hand Exam   Muscle Strength  Grip:  5/5    Right Elbow Exam   Tenderness  The patient is experiencing no tenderness.   Tests  Tinel's sign (cubital tunnel): negative      Patient is alert, oriented, no adenopathy, well-dressed, normal affect, normal respiratory effort. Patient indicates numbness with palpation over all fingers and medial > lateral forearm. Good pulses in the right upper extremity.   Imaging: No results found. No images are attached to the encounter.  Labs: Lab Results  Component  Value Date   ESRSEDRATE 17 06/22/2017   CRP 0.2 (L) 06/22/2017   REPTSTATUS 11/03/2015 FINAL 10/29/2015   CULT NO GROWTH 5 DAYS 10/29/2015     Lab Results  Component Value Date   ALBUMIN 4.5 07/21/2018   ALBUMIN 4.2 02/25/2018   ALBUMIN 4.4 10/08/2017    Lab Results  Component Value Date   MG 2.0 10/18/2014   Lab Results  Component Value Date   VD25OH 37.1 07/21/2018   VD25OH 43.5 02/25/2018   VD25OH 49.0 10/08/2017    No results found for: PREALBUMIN CBC EXTENDED Latest Ref Rng & Units 07/21/2018 02/25/2018 10/08/2017  WBC 3.4 - 10.8 x10E3/uL 5.9 5.7 5.9  RBC 3.77 - 5.28 x10E6/uL 4.81 4.53 4.86  HGB 11.1 - 15.9 g/dL 14.5 13.6 14.9  HCT 34.0 - 46.6 % 42.9 41.7 44.2  PLT 150 - 450 x10E3/uL 189 160 178  NEUTROABS 1.4 - 7.0 x10E3/uL 3.8 3.7 3.6  LYMPHSABS 0.7 - 3.1 x10E3/uL 1.5 1.3 1.6     Body mass index is 29.41 kg/m.  Orders:  Orders Placed This Encounter  Procedures  . XR Elbow 2 Views Right  . XR Wrist Complete Right  . MR Cervical Spine w/o contrast  No orders of the defined types were placed in this encounter.    Procedures: No procedures performed  Clinical Data: No additional findings.  ROS:  All other systems negative, except as noted in the HPI. Review of Systems  Constitutional: Negative for chills and fever.  Musculoskeletal: Positive for arthralgias, myalgias and neck pain.  Neurological: Positive for weakness and numbness.    Objective: Vital Signs: Ht 5\' 3"  (1.6 m)   Wt 166 lb (75.3 kg)   BMI 29.41 kg/m   Specialty Comments:  No specialty comments available.  PMFS History: Patient Active Problem List   Diagnosis Date Noted  . Dyslipidemia 11/17/2018  . Chest pain of uncertain etiology 31/49/7026  . Carpal tunnel syndrome of right wrist 03/25/2018  . Influenza with pneumonia 10/30/2015  . CAP (community acquired pneumonia) 10/29/2015  . Abdominal aortic atherosclerosis ([place]) 04/10/2015  . PSVT (paroxysmal supraventricular  tachycardia) ([place]) 05/10/2014  . Macular degeneration, age related, nonexudative 08/24/2013  . History of right mastectomy 07/13/2013  . Anemia, iron deficiency 06/21/2013  . Allergic rhinitis 06/21/2013  . Chronic low back pain 06/21/2013  . Malignant neoplasm of female breast ([place]) 11/22/2007  . Mixed hyperlipidemia 11/22/2007  . Anxiety state 11/22/2007  . DEPRESSION 11/22/2007  . Asthma 11/22/2007  . GERD 11/22/2007  . GASTRITIS, WITH HEMORRHAGE 08/14/2007  . DUODENITIS, WITHOUT HEMORRHAGE 08/14/2007  . HIATAL HERNIA 08/14/2007  . 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   Past Medical History:  Diagnosis Date  . Anemia   . Asthma   . Brain tumor ([place])    1986  . Breast cancer ([place])    Right breast  . Cataract   . Colon polyps   . COPD (chronic obstructive pulmonary disease) ([place])   . Hiatal hernia   . Hyperlipidemia   . Leg edema   . Macular degeneration   . Osteoporosis   . Pneumonia   . PVD (posterior vitreous detachment)   . Right knee pain   . Shingles rash     Family History  Problem Relation Age of Onset  . Heart disease Mother 48       Stroke   . Dementia Mother   . Congestive Heart Failure Mother   . Osteoporosis Mother   . Heart disease Sister   . Hypertension Sister   . Diabetes Sister   . Osteoporosis Sister   . Heart disease Brother   . Hyperlipidemia Brother   . Diabetes Brother   . Heart disease Sister   . Hypertension Sister   . Diabetes Sister   . Diabetes Sister   . Hypertension Sister   . Diabetes Sister   . Hypertension Sister   . Diabetes Brother   . Hyperlipidemia Brother   . Cancer Father        lung  . Heart attack Father   . Diabetes Brother   . Heart disease Brother   . Hyperlipidemia Brother   . Diabetes Brother   . Diabetes Son   . Colon cancer Neg Hx   . Rectal cancer Neg Hx   . Stomach cancer Neg Hx   . Esophageal cancer Neg Hx     Past Surgical History:  Procedure Laterality Date  . CATARACT EXTRACTION,  BILATERAL    . COLONOSCOPY    . CRANIECTOMY / CRANIOTOMY FOR EXCISION OF BRAIN TUMOR     Left side brain  . MASTECTOMY Right   . PARTIAL HYSTERECTOMY    . POLYPECTOMY    . SKIN CANCER EXCISION  Left    Under left eye   Social History   Occupational History  . Occupation: retired  Tobacco Use  . Smoking status: Former Smoker    Types: Cigarettes    Quit date: 02/09/1961    Years since quitting: 58.0  . Smokeless tobacco: Never Used  Substance and Sexual Activity  . Alcohol use: No    Alcohol/week: 0.0 standard drinks  . Drug use: No  . Sexual activity: Never

## 2019-02-23 ENCOUNTER — Ambulatory Visit (INDEPENDENT_AMBULATORY_CARE_PROVIDER_SITE_OTHER): Payer: Medicare HMO

## 2019-02-23 DIAGNOSIS — Z0000 Encounter for general adult medical examination without abnormal findings: Secondary | ICD-10-CM

## 2019-02-23 NOTE — Progress Notes (Signed)
MEDICARE ANNUAL WELLNESS VISIT  02/23/2019  Telephone Visit Disclaimer This Medicare AWV was conducted by telephone due to national recommendations for restrictions regarding the COVID-19 Pandemic (e.g. social distancing).  I verified, using two identifiers, that I am speaking with Krystal [name] or their authorized healthcare agent. I discussed the limitations, risks, security, and privacy concerns of performing an evaluation and management service by telephone and the potential availability of an in-person appointment in the future. The patient expressed understanding and agreed to proceed.   Subjective:  Krystal [name] is a 78 y.o. female patient of Dettinger, Krystal Kaufmann, MD who had a Medicare Annual Wellness Visit today via telephone. Cait is Retired and lives with their son. she has 1 children. she reports that she is socially active and does interact with friends/family regularly. she is minimally physically active and.  Patient Care Team: Dettinger, Krystal Kaufmann, MD as PCP - General (Family Medicine) Milus Banister, MD (Gastroenterology) Sherlynn Stalls, MD as Consulting Physician (Ophthalmology) Satira Sark, MD as Consulting Physician (Cardiology) Okey Regal, Clarinda as Consulting Physician (Optometry) Minus Breeding, MD as Consulting Physician (Cardiology)  Advanced Directives 02/23/2019 01/13/2018 09/08/2017 01/05/2017 11/14/2016 10/16/2016 12/10/2015  Does Patient Have a Medical Advance Directive? No No No No No No No  Would patient like information on creating a medical advance directive? No - Patient declined No - Patient declined - Yes (MAU/Ambulatory/Procedural Areas - Information given) No - Patient declined - Yes - Educational materials given    Hospital Utilization Over the Past 12 Months: # of hospitalizations or ER visits: 0 # of surgeries: 0  Review of Systems    Patient reports that her overall health is unchanged compared to last year.  Patient  Reported Readings (BP, Pulse, CBG, Weight, etc) none  Review of Systems: History obtained from chart review  All other systems negative.  Pain Assessment Pain : 0-10 Pain Score: 5  Pain Location: Back Pain Descriptors / Indicators: Constant Pain Onset: More than a month ago Pain Frequency: Intermittent Pain Relieving Factors: Rest Effect of Pain on Daily Activities: Slows patient down  Pain Relieving Factors: Rest  Current Medications & Allergies (verified) Allergies as of 02/23/2019      Reactions   Actonel [risedronate] Nausea And Vomiting, Other (See Comments)   Throat tightness   Fosamax [alendronate Sodium] Other (See Comments)   esophagitis   Mevacor [lovastatin] Other (See Comments)   myalgias   Miacalcin [calcitonin (salmon)] Nausea And Vomiting   Mobic [meloxicam] Hives, Itching   rash      Medication List       Accurate as of February 23, 2019  2:42 PM. If you have any questions, ask your nurse or doctor.        albuterol 108 (90 Base) MCG/ACT inhaler Commonly known as: ProAir HFA INHALE TWO PUFFS BY MOUTH 4 TIMES DAILY AS NEEDED   aspirin EC 81 MG tablet Take 81 mg by mouth at bedtime.   Calcium Citrate-Vitamin D 315-250 MG-UNIT Tabs Commonly known as: Calcium Citrate + D Take 2 tablets by mouth daily.   cetirizine 10 MG tablet Commonly known as: ZYRTEC Take 5-10 mg by mouth at bedtime.   diazepam 5 MG tablet Commonly known as: VALIUM TAKE 1 TABLET BY MOUTH EVERY 6 HOURS AS NEEDED   diltiazem 300 MG 24 hr capsule Commonly known as: CARDIZEM CD Take 1 capsule (300 mg total) by mouth daily.   esomeprazole 40 MG capsule Commonly known as: NEXIUM  TAKE 1 CAPSULE BY MOUTH EVERY DAY   ferrous sulfate 325 (65 FE) MG tablet Take 325 mg by mouth at bedtime.   ipratropium-albuterol 0.5-2.5 (3) MG/3ML Soln Commonly known as: DUONEB Take 3 mLs by nebulization every 6 (six) hours as needed. Dx:  Asthma 493.90   metoprolol tartrate 25 MG tablet  Commonly known as: LOPRESSOR Take 1 tablet (25 mg total) by mouth daily as needed.   montelukast 10 MG tablet Commonly known as: SINGULAIR TAKE 1 TABLET BY MOUTH EVERYDAY AT BEDTIME   nitroGLYCERIN 0.4 MG/SPRAY spray Commonly known as: NITROLINGUAL ONE SPRAY UNDER TONGUE AS NEED FOR CHESTPAIN. MAY REPEAT IN 5 MINUTES IF PAIN NOT RELIEVED CALL 911.   predniSONE 10 MG tablet Commonly known as: DELTASONE Take 2 tablets (20 mg total) by mouth daily with breakfast.   PRESERVISION/LUTEIN PO Take 1 capsule by mouth at bedtime.   Prolia 60 MG/ML Sosy injection Generic drug: denosumab TO BE ADMINISTERED IN PHYSICIAN'S OFFICE. INJECT ONE SYRINGE SUBCOUSLY ONCE EVERY 6 MONTHS. REFRIGERATE. USE WITHIN 14 DAYS ONCE AT ROOM TEMPERATURE.   Restasis 0.05 % ophthalmic emulsion Generic drug: cycloSPORINE Place 1 drop into both eyes 2 (two) times daily.   simvastatin 10 MG tablet Commonly known as: ZOCOR TAKE 1 TABLET BY MOUTH EVERY DAY   Vitamin D 50 MCG (2000 UT) Caps Take 1 capsule by mouth at bedtime.       History (reviewed): Past Medical History:  Diagnosis Date  . Anemia   . Asthma   . Brain tumor ([place])    1986  . Breast cancer ([place])    Right breast  . Cataract   . Colon polyps   . COPD (chronic obstructive pulmonary disease) ([place])   . Hiatal hernia   . Hyperlipidemia   . Leg edema   . Macular degeneration   . Osteoporosis   . Pneumonia   . PVD (posterior vitreous detachment)   . Right knee pain   . Shingles rash    Past Surgical History:  Procedure Laterality Date  . CATARACT EXTRACTION, BILATERAL    . COLONOSCOPY    . CRANIECTOMY / CRANIOTOMY FOR EXCISION OF BRAIN TUMOR     Left side brain  . MASTECTOMY Right   . PARTIAL HYSTERECTOMY    . POLYPECTOMY    . SKIN CANCER EXCISION Left    Under left eye   Family History  Problem Relation Age of Onset  . Heart disease Mother 53       Stroke   . Dementia Mother   . Congestive Heart Failure Mother   .  Osteoporosis Mother   . Stroke Mother   . Heart disease Sister   . Hypertension Sister   . Diabetes Sister   . Osteoporosis Sister   . Heart disease Brother   . Hyperlipidemia Brother   . Diabetes Brother   . Heart disease Sister   . Hypertension Sister   . Diabetes Sister   . Diabetes Sister   . Hypertension Sister   . Diabetes Sister   . Hypertension Sister   . Diabetes Brother   . Hyperlipidemia Brother   . Cancer Father        lung  . Heart attack Father   . Diabetes Brother   . Heart disease Brother   . Hyperlipidemia Brother   . Diabetes Brother   . Diabetes Son   . Colon cancer Neg Hx   . Rectal cancer Neg Hx   . Stomach cancer Neg  Hx   . Esophageal cancer Neg Hx    Social History   Socioeconomic History  . Marital status: Widowed    Spouse name: Not on file  . Number of children: 1  . Years of education: 57th  . Highest education level: 7th grade  Occupational History  . Occupation: retired  Scientific laboratory technician  . Financial resource strain: Not hard at all  . Food insecurity    Worry: Never true    Inability: Never true  . Transportation needs    Medical: No    Non-medical: No  Tobacco Use  . Smoking status: Former Smoker    Types: Cigarettes    Quit date: 02/09/1961    Years since quitting: 58.0  . Smokeless tobacco: Never Used  Substance and Sexual Activity  . Alcohol use: No    Alcohol/week: 0.0 standard drinks  . Drug use: No  . Sexual activity: Not Currently  Lifestyle  . Physical activity    Days per week: 0 days    Minutes per session: 0 min  . Stress: Not on file  Relationships  . Social connections    Talks on phone: More than three times a week    Gets together: More than three times a week    Attends religious service: Not on file    Active member of club or organization: Not on file    Attends meetings of clubs or organizations: Not on file    Relationship status: Widowed  Other Topics Concern  . Not on file  Social History  Narrative   Son lives with patient.   Retired from farming.   Highest level of education:  7th    Activities of Daily Living In your present state of health, do you have any difficulty performing the following activities: 02/23/2019  Hearing? N  Vision? Y  Comment Wears glasses all the time  Difficulty concentrating or making decisions? N  Walking or climbing stairs? Y  Dressing or bathing? N  Doing errands, shopping? N  Preparing Food and eating ? N  Using the Toilet? N  In the past six months, have you accidently leaked urine? N  Do you have problems with loss of bowel control? N  Managing your Medications? N  Housekeeping or managing your Housekeeping? N  Some recent data might be hidden    Patient Education/ Literacy How often do you need to have someone help you when you read instructions, pamphlets, or other written materials from your doctor or pharmacy?: 1 - Never What is the last grade level you completed in school?: 7th grade  Exercise Current Exercise Habits: The patient does not participate in regular exercise at present, Exercise limited by: orthopedic condition(s)  Diet Patient reports consuming 3 meals a day and 2 snack(s) a day Patient reports that her primary diet is: Regular Patient reports that she does have regular access to food.   Depression Screen PHQ 2/9 Scores 07/21/2018 03/06/2018 02/25/2018 01/13/2018 10/08/2017 09/11/2017 05/25/2017  PHQ - 2 Score 0 0 0 0 0 0 0     Fall Risk Fall Risk  07/21/2018 03/06/2018 02/25/2018 01/13/2018 10/08/2017  Falls in the past year? 0 No No No No  Number falls in past yr: - - - - -  Comment - - - - -  Injury with Fall? - - - - -  Comment - - - - -  Risk Factor Category  - - - - -  Risk for fall due to : - - - - -  Risk for fall due to: Comment - - - - -  Follow up - - - - -     Objective:  [place] seemed alert and oriented and she participated appropriately during our telephone visit.  Blood Pressure  Weight BMI  BP Readings from Last 3 Encounters:  02/16/19 128/65  08/21/18 (!) 106/57  07/21/18 (!) 95/59   Wt Readings from Last 3 Encounters:  02/22/19 166 lb (75.3 kg)  02/16/19 166 lb (75.3 kg)  02/07/19 155 lb (70.3 kg)   BMI Readings from Last 1 Encounters:  02/22/19 29.41 kg/m    *Unable to obtain current vital signs, weight, and BMI due to telephone visit type  Hearing/Vision  . Keeana did not seem to have difficulty with hearing/understanding during the telephone conversation . Reports that she has had a formal eye exam by an eye care professional within the past year . Reports that she has not had a formal hearing evaluation within the past year *Unable to fully assess hearing and vision during telephone visit type  Cognitive Function: 6CIT Screen 02/23/2019  What Year? 0 points  What month? 0 points  What time? 0 points  Count back from 20 0 points  Months in reverse 0 points  Repeat phrase 0 points  Total Score 0   (Normal:0-7, Significant for Dysfunction: >8)  Normal Cognitive Function Screening: Yes   Immunization & Health Maintenance Record Immunization History  Administered Date(s) Administered  . Pneumococcal Conjugate-13 06/21/2013  . Pneumococcal Polysaccharide-23 04/26/2005    Health Maintenance  Topic Date Due  . TETANUS/TDAP  11/03/2015  . INFLUENZA VACCINE  04/08/2021 (Originally 03/05/2019)  . MAMMOGRAM  05/29/2019  . DEXA SCAN  01/14/2020  . COLONOSCOPY  04/07/2020  . PNA vac Low Risk Adult  Completed       Assessment  This is a routine wellness examination for Krystal [name].  Health Maintenance: Due or Overdue Health Maintenance Due  Topic Date Due  . TETANUS/TDAP  11/03/2015    Krystal [name] does not need a referral for Community Assistance: Care Management:   no Social Work:    no Prescription Assistance:  no Nutrition/Diabetes Education:  no   Plan:  Personalized Goals Goals Addressed            This  Visit's Progress   . DIET - EAT MORE FRUITS AND VEGETABLES        Personalized Health Maintenance & Screening Recommendations  Patient is up to date on all health maintenance  Lung Cancer Screening Recommended: no (Low Dose CT Chest recommended if Age 24-80 years, 30 pack-year currently smoking OR have quit w/in past 15 years) Hepatitis C Screening recommended: no HIV Screening recommended: no  Advanced Directives: Written information was not prepared per patient's request.  Referrals & Orders No orders of the defined types were placed in this encounter.   Follow-up Plan . Follow-up with Dettinger, Krystal Kaufmann, MD as planned     I have personally reviewed and noted the following in the patient's chart:   . Medical and social history . Use of alcohol, tobacco or illicit drugs  . Current medications and supplements . Functional ability and status . Nutritional status . Physical activity . Advanced directives . List of other physicians . Hospitalizations, surgeries, and ER visits in previous 12 months . Vitals . Screenings to include cognitive, depression, and falls . Referrals and appointments  In addition, I have reviewed and discussed with Krystal [name] certain preventive  protocols, quality metrics, and best practice recommendations. A written personalized care plan for preventive services as well as general preventive health recommendations is available and can be mailed to the patient at her request.      Rolena Infante LPN [id number]

## 2019-02-24 DIAGNOSIS — H53413 Scotoma involving central area, bilateral: Secondary | ICD-10-CM | POA: Diagnosis not present

## 2019-02-28 ENCOUNTER — Telehealth (HOSPITAL_COMMUNITY): Payer: Self-pay | Admitting: *Deleted

## 2019-02-28 NOTE — Telephone Encounter (Signed)
Left message on voicemail per DPR in reference to upcoming appointment scheduled on 03/02/19 with detailed instructions given per Myocardial Perfusion Study Information Sheet for the test. LM to arrive 15 minutes early, and that it is imperative to arrive on time for appointment to keep from having the test rescheduled. If you need to cancel or reschedule your appointment, please call the office within 24 hours of your appointment. Failure to do so may result in a cancellation of your appointment, and a $50 no show fee. Phone number given for call back for any questions. Kirstie Peri

## 2019-03-01 DIAGNOSIS — H53413 Scotoma involving central area, bilateral: Secondary | ICD-10-CM | POA: Diagnosis not present

## 2019-03-02 ENCOUNTER — Other Ambulatory Visit: Payer: Self-pay

## 2019-03-02 ENCOUNTER — Ambulatory Visit (HOSPITAL_COMMUNITY): Payer: Medicare HMO | Attending: Cardiovascular Disease

## 2019-03-02 DIAGNOSIS — R079 Chest pain, unspecified: Secondary | ICD-10-CM

## 2019-03-02 DIAGNOSIS — R0789 Other chest pain: Secondary | ICD-10-CM | POA: Insufficient documentation

## 2019-03-02 LAB — MYOCARDIAL PERFUSION IMAGING
LV dias vol: 45 mL (ref 46–106)
LV sys vol: 11 mL
Peak HR: 93 {beats}/min
Rest HR: 77 {beats}/min
SDS: 1
SRS: 1
SSS: 2
TID: 1.07

## 2019-03-02 MED ORDER — TECHNETIUM TC 99M TETROFOSMIN IV KIT
32.3000 | PACK | Freq: Once | INTRAVENOUS | Status: AC | PRN
  Administered 2019-03-02: 32.3 via INTRAVENOUS
  Filled 2019-03-02: qty 33

## 2019-03-02 MED ORDER — REGADENOSON 0.4 MG/5ML IV SOLN
0.4000 mg | Freq: Once | INTRAVENOUS | Status: AC
  Administered 2019-03-02: 0.4 mg via INTRAVENOUS

## 2019-03-02 MED ORDER — TECHNETIUM TC 99M TETROFOSMIN IV KIT
10.3000 | PACK | Freq: Once | INTRAVENOUS | Status: AC | PRN
  Administered 2019-03-02: 10.3 via INTRAVENOUS
  Filled 2019-03-02: qty 11

## 2019-03-03 ENCOUNTER — Other Ambulatory Visit: Payer: Self-pay | Admitting: Family Medicine

## 2019-03-04 ENCOUNTER — Other Ambulatory Visit: Payer: Self-pay | Admitting: Family Medicine

## 2019-03-08 ENCOUNTER — Telehealth: Payer: Self-pay | Admitting: Cardiology

## 2019-03-08 DIAGNOSIS — H53413 Scotoma involving central area, bilateral: Secondary | ICD-10-CM | POA: Diagnosis not present

## 2019-03-08 NOTE — Telephone Encounter (Signed)
New Message     Pt is calling about results  She Says she will be available until 12   Please call

## 2019-03-08 NOTE — Telephone Encounter (Signed)
Called patient, advised of MYO results. Patient verbalized understanding.

## 2019-03-09 ENCOUNTER — Other Ambulatory Visit: Payer: Self-pay

## 2019-03-09 ENCOUNTER — Ambulatory Visit (HOSPITAL_COMMUNITY)
Admission: RE | Admit: 2019-03-09 | Discharge: 2019-03-09 | Disposition: A | Discharge status: Home / Self Care | Payer: Medicare HMO | Source: Ambulatory Visit | Attending: Family | Admitting: Family

## 2019-03-09 DIAGNOSIS — M542 Cervicalgia: Secondary | ICD-10-CM | POA: Diagnosis not present

## 2019-03-09 DIAGNOSIS — M5412 Radiculopathy, cervical region: Secondary | ICD-10-CM | POA: Diagnosis not present

## 2019-03-09 DIAGNOSIS — M79601 Pain in right arm: Secondary | ICD-10-CM | POA: Insufficient documentation

## 2019-03-17 ENCOUNTER — Ambulatory Visit: Payer: Medicare HMO | Admitting: Orthopedic Surgery

## 2019-03-21 ENCOUNTER — Ambulatory Visit (INDEPENDENT_AMBULATORY_CARE_PROVIDER_SITE_OTHER): Payer: Medicare HMO | Admitting: Orthopedic Surgery

## 2019-03-21 ENCOUNTER — Other Ambulatory Visit: Payer: Self-pay

## 2019-03-21 DIAGNOSIS — M5412 Radiculopathy, cervical region: Secondary | ICD-10-CM | POA: Diagnosis not present

## 2019-03-22 ENCOUNTER — Encounter: Payer: Self-pay | Admitting: Orthopedic Surgery

## 2019-03-22 NOTE — Progress Notes (Signed)
Office Visit Note   Patien patient states she has tingling in her right hand and complains of weakness in the right hand as well.  She states she has had no relief with her prednisone.  TClearence [name]           Date of Birth: 02-23-1941           MRN: [id number] Visit Date: 03/21/2019              Requested by: Krystal Rancher, MD [address] [place],  [place] [address] PCP: Dettinger, Fransisca Kaufmann, MD  Chief Complaint  Patient presents with  . Neck - Follow-up      HPI: Patient is a 78 year old woman who presents in follow-up for on her MRI scan cervical spine.  Assessment & Plan: Visit Diagnoses:  1. Radiculopathy, cervical region     Plan: Will have patient follow-up with Dr. Ernestina [name] for evaluation for cervical epidural steroid injection.  Follow-Up Instructions: Return if symptoms worsen or fail to improve.   Ortho Exam  Patient is alert, oriented, no adenopathy, well-dressed, normal affect, normal respiratory effort. Examination patient has equal motor strength in both upper extremities strength is approximately 4/5 throughout both upper extremities grip strength appears symmetric.  Review of MRI scan shows mild impingement at C5-6 and C4-5 no cervical stenosis.  The MRI scan was reviewed.  Imaging: No results found. No images are attached to the encounter.  Labs: Lab Results  Component Value Date   ESRSEDRATE 17 06/22/2017   CRP 0.2 (L) 06/22/2017   REPTSTATUS 11/03/2015 FINAL 10/29/2015   CULT NO GROWTH 5 DAYS 10/29/2015     Lab Results  Component Value Date   ALBUMIN 4.5 07/21/2018   ALBUMIN 4.2 02/25/2018   ALBUMIN 4.4 10/08/2017    Lab Results  Component Value Date   MG 2.0 10/18/2014   Lab Results  Component Value Date   VD25OH 37.1 07/21/2018   VD25OH 43.5 02/25/2018   VD25OH 49.0 10/08/2017    No results found for: PREALBUMIN CBC EXTENDED Latest Ref Rng & Units 07/21/2018 02/25/2018 10/08/2017  WBC 3.4 - 10.8 x10E3/uL 5.9 5.7 5.9   RBC 3.77 - 5.28 x10E6/uL 4.81 4.53 4.86  HGB 11.1 - 15.9 g/dL 14.5 13.6 14.9  HCT 34.0 - 46.6 % 42.9 41.7 44.2  PLT 150 - 450 x10E3/uL 189 160 178  NEUTROABS 1.4 - 7.0 x10E3/uL 3.8 3.7 3.6  LYMPHSABS 0.7 - 3.1 x10E3/uL 1.5 1.3 1.6     There is no height or weight on file to calculate BMI.  Orders:  Orders Placed This Encounter  Procedures  . Ambulatory referral to Physical Medicine Rehab   No orders of the defined types were placed in this encounter.    Procedures: No procedures performed  Clinical Data: No additional findings.  ROS:  All other systems negative, except as noted in the HPI. Review of Systems  Objective: Vital Signs: There were no vitals taken for this visit.  Specialty Comments:  No specialty comments available.  PMFS History: Patient Active Problem List   Diagnosis Date Noted  . Dyslipidemia 11/17/2018  . Chest pain of uncertain etiology 53/61/4431  . Carpal tunnel syndrome of right wrist 03/25/2018  . Influenza with pneumonia 10/30/2015  . CAP (community acquired pneumonia) 10/29/2015  . Abdominal aortic atherosclerosis ([place]) 04/10/2015  . PSVT (paroxysmal supraventricular tachycardia) ([place]) 05/10/2014  . Macular degeneration, age related, nonexudative 08/24/2013  . History of right mastectomy 07/13/2013  . Anemia,  iron deficiency 06/21/2013  . Allergic rhinitis 06/21/2013  . Chronic low back pain 06/21/2013  . Malignant neoplasm of female breast ([facility]) 11/22/2007  . Mixed hyperlipidemia 11/22/2007  . Anxiety state 11/22/2007  . DEPRESSION 11/22/2007  . Asthma 11/22/2007  . GERD 11/22/2007  . GASTRITIS, WITH HEMORRHAGE 08/14/2007  . DUODENITIS, WITHOUT HEMORRHAGE 08/14/2007  . HIATAL HERNIA 08/14/2007  . 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   Past Medical History:  Diagnosis Date  . Anemia   . Asthma   . Brain tumor ([place])    1986  . Breast cancer ([facility])    Right breast  . Cataract   . Colon polyps   . COPD (chronic obstructive  pulmonary disease) ([facility])   . Hiatal hernia   . Hyperlipidemia   . Leg edema   . Macular degeneration   . Osteoporosis   . Pneumonia   . PVD (posterior vitreous detachment)   . Right knee pain   . Shingles rash     Family History  Problem Relation Age of Onset  . Heart disease Mother 46       Stroke   . Dementia Mother   . Congestive Heart Failure Mother   . Osteoporosis Mother   . Stroke Mother   . Heart disease Sister   . Hypertension Sister   . Diabetes Sister   . Osteoporosis Sister   . Heart disease Brother   . Hyperlipidemia Brother   . Diabetes Brother   . Heart disease Sister   . Hypertension Sister   . Diabetes Sister   . Diabetes Sister   . Hypertension Sister   . Diabetes Sister   . Hypertension Sister   . Diabetes Brother   . Hyperlipidemia Brother   . Cancer Father        lung  . Heart attack Father   . Diabetes Brother   . Heart disease Brother   . Hyperlipidemia Brother   . Diabetes Brother   . Diabetes Son   . Colon cancer Neg Hx   . Rectal cancer Neg Hx   . Stomach cancer Neg Hx   . Esophageal cancer Neg Hx     Past Surgical History:  Procedure Laterality Date  . CATARACT EXTRACTION, BILATERAL    . COLONOSCOPY    . CRANIECTOMY / CRANIOTOMY FOR EXCISION OF BRAIN TUMOR     Left side brain  . MASTECTOMY Right   . PARTIAL HYSTERECTOMY    . POLYPECTOMY    . SKIN CANCER EXCISION Left    Under left eye   Social History   Occupational History  . Occupation: retired  Tobacco Use  . Smoking status: Former Smoker    Types: Cigarettes    Quit date: 02/09/1961    Years since quitting: 58.1  . Smokeless tobacco: Never Used  Substance and Sexual Activity  . Alcohol use: No    Alcohol/week: 0.0 standard drinks  . Drug use: No  . Sexual activity: Not Currently

## 2019-03-23 ENCOUNTER — Other Ambulatory Visit: Payer: Self-pay

## 2019-03-24 ENCOUNTER — Ambulatory Visit (INDEPENDENT_AMBULATORY_CARE_PROVIDER_SITE_OTHER): Payer: Medicare HMO | Admitting: Family Medicine

## 2019-03-24 ENCOUNTER — Encounter: Payer: Self-pay | Admitting: Family Medicine

## 2019-03-24 VITALS — BP 117/69 | HR 88 | Temp 97.8°F | Ht 63.0 in | Wt 166.0 lb

## 2019-03-24 DIAGNOSIS — E782 Mixed hyperlipidemia: Secondary | ICD-10-CM | POA: Diagnosis not present

## 2019-03-24 DIAGNOSIS — F339 Major depressive disorder, recurrent, unspecified: Secondary | ICD-10-CM

## 2019-03-24 DIAGNOSIS — J4520 Mild intermittent asthma, uncomplicated: Secondary | ICD-10-CM

## 2019-03-24 DIAGNOSIS — M810 Age-related osteoporosis without current pathological fracture: Secondary | ICD-10-CM

## 2019-03-24 DIAGNOSIS — R69 Illness, unspecified: Secondary | ICD-10-CM | POA: Diagnosis not present

## 2019-03-24 DIAGNOSIS — K219 Gastro-esophageal reflux disease without esophagitis: Secondary | ICD-10-CM

## 2019-03-24 DIAGNOSIS — D509 Iron deficiency anemia, unspecified: Secondary | ICD-10-CM

## 2019-03-24 DIAGNOSIS — F411 Generalized anxiety disorder: Secondary | ICD-10-CM | POA: Diagnosis not present

## 2019-03-24 NOTE — Progress Notes (Signed)
BP 117/69   Pulse 88   Temp 97.8 F (36.6 C) (Temporal)   Ht 5' 3"  (1.6 m)   Wt 166 lb (75.3 kg)   BMI 29.41 kg/m    Subjective:   Patient ID: Krystal [name], female    DOB: 08-22-1940, 78 y.o.   MRN: [id number]  HPI: Krystal [name] is a 78 y.o. female presenting on 03/24/2019 for Establish Care ([place]) and Medical Management of Chronic Issues   HPI GERD Patient is currently on Nexium.  She denies any major symptoms or abdominal pain or belching or burping. She denies any blood in her stool or lightheadedness or dizziness.   Hyperlipidemia Patient is coming in for recheck of his hyperlipidemia. The patient is currently taking simvastatin. They deny any issues with myalgias or history of liver damage from it. They deny any focal numbness or weakness or chest pain.   Anxiety and depression Patient is coming in for anxiety and depression recheck.  She currently takes Valium milligrams as needed but does not nicely take it every day, she only takes it when she needs it.  She still has refills on it.  Patient denies any suicidal ideations or thoughts of hurting self.  She says her anxiety does build up at times but recently she has been doing very well and has not had as many issues.  Current rx-Valium 5 mg daily as needed # meds rx-30 Effectiveness of current meds-works well and does not have to use every day. Adverse reactions form meds-none, discussed memory as a possible side effect.  Pill count performed-No Last drug screen - n/a ( high risk q35m moderate risk q624mlow risk yearly ) Urine drug screen today- No Was the [place]yes  If yes were their any concerning findings? -None back  No flowsheet data found.  Patient is also been diagnosed with asthma and osteoporosis and deficiency anemia, we will recheck the anemia blood work today and she is due for DEXA scan as soon as we have one available for her.  Our radiology tech is in training for this  currently.  Relevant past medical, surgical, family and social history reviewed and updated as indicated. Interim medical history since our last visit reviewed. Allergies and medications reviewed and updated.  Review of Systems  Constitutional: Negative for chills and fever.  Eyes: Negative for visual disturbance.  Respiratory: Negative for chest tightness and shortness of breath.   Cardiovascular: Negative for chest pain and leg swelling.  Musculoskeletal: Negative for back pain and gait problem.  Skin: Negative for rash.  Neurological: Negative for light-headedness and headaches.  Psychiatric/Behavioral: Positive for dysphoric mood. Negative for agitation, behavioral problems, self-injury, sleep disturbance and suicidal ideas. The patient is nervous/anxious.   All other systems reviewed and are negative.   Per HPI unless specifically indicated above   Allergies as of 03/24/2019      Reactions   Actonel [risedronate] Nausea And Vomiting, Other (See Comments)   Throat tightness   Fosamax [alendronate Sodium] Other (See Comments)   esophagitis   Mevacor [lovastatin] Other (See Comments)   myalgias   Miacalcin [calcitonin (salmon)] Nausea And Vomiting   Mobic [meloxicam] Hives, Itching   rash      Medication List       Accurate as of March 24, 2019  2:21 PM. If you have any questions, ask your nurse or doctor.        STOP taking these medications   predniSONE 10 MG tablet Commonly  known as: DELTASONE Stopped by: Worthy Rancher, MD     TAKE these medications   albuterol 108 (90 Base) MCG/ACT inhaler Commonly known as: ProAir HFA INHALE TWO PUFFS BY MOUTH 4 TIMES DAILY AS NEEDED   aspirin EC 81 MG tablet Take 81 mg by mouth at bedtime.   Calcium Citrate-Vitamin D 315-250 MG-UNIT Tabs Commonly known as: Calcium Citrate + D Take 2 tablets by mouth daily.   cetirizine 10 MG tablet Commonly known as: ZYRTEC Take 5-10 mg by mouth at bedtime.   diazepam 5 MG  tablet Commonly known as: VALIUM TAKE 1 TABLET BY MOUTH EVERY 6 HOURS AS NEEDED   diltiazem 300 MG 24 hr capsule Commonly known as: CARDIZEM CD Take 1 capsule (300 mg total) by mouth daily.   esomeprazole 40 MG capsule Commonly known as: NEXIUM TAKE 1 CAPSULE BY MOUTH EVERY DAY   ferrous sulfate 325 (65 FE) MG tablet Take 325 mg by mouth at bedtime.   ipratropium-albuterol 0.5-2.5 (3) MG/3ML Soln Commonly known as: DUONEB Take 3 mLs by nebulization every 6 (six) hours as needed. Dx:  Asthma 493.90   metoprolol tartrate 25 MG tablet Commonly known as: LOPRESSOR Take 1 tablet (25 mg total) by mouth daily as needed.   montelukast 10 MG tablet Commonly known as: SINGULAIR TAKE 1 TABLET BY MOUTH EVERYDAY AT BEDTIME   nitroGLYCERIN 0.4 MG/SPRAY spray Commonly known as: NITROLINGUAL ONE SPRAY UNDER TONGUE AS NEED FOR CHESTPAIN. MAY REPEAT IN 5 MINUTES IF PAIN NOT RELIEVED CALL 911.   PRESERVISION/LUTEIN PO Take 1 capsule by mouth at bedtime.   Prolia 60 MG/ML Sosy injection Generic drug: denosumab TO BE ADMINISTERED IN PHYSICIAN'S OFFICE. INJECT ONE SYRINGE SUBCOUSLY ONCE EVERY 6 MONTHS. REFRIGERATE. USE WITHIN 14 DAYS ONCE AT ROOM TEMPERATURE.   Restasis 0.05 % ophthalmic emulsion Generic drug: cycloSPORINE Place 1 drop into both eyes 2 (two) times daily.   simvastatin 10 MG tablet Commonly known as: ZOCOR TAKE 1 TABLET BY MOUTH EVERY DAY   Vitamin D 50 MCG (2000 UT) Caps Take 1 capsule by mouth at bedtime.        Objective:   BP 117/69   Pulse 88   Temp 97.8 F (36.6 C) (Temporal)   Ht 5' 3"  (1.6 m)   Wt 166 lb (75.3 kg)   BMI 29.41 kg/m   Wt Readings from Last 3 Encounters:  03/24/19 166 lb (75.3 kg)  03/02/19 166 lb (75.3 kg)  02/22/19 166 lb (75.3 kg)    Physical Exam Vitals signs and nursing note reviewed.  Constitutional:      General: She is not in acute distress.    Appearance: She is well-developed. She is not diaphoretic.  Eyes:      Conjunctiva/sclera: Conjunctivae normal.  Cardiovascular:     Rate and Rhythm: Normal rate and regular rhythm.     Heart sounds: Normal heart sounds. No murmur.  Pulmonary:     Effort: Pulmonary effort is normal. No respiratory distress.     Breath sounds: Normal breath sounds. No wheezing.  Musculoskeletal: Normal range of motion.        General: No tenderness.  Skin:    General: Skin is warm and dry.     Findings: No rash.  Neurological:     Mental Status: She is alert and oriented to person, place, and time.     Coordination: Coordination normal.  Psychiatric:        Behavior: Behavior normal.  Assessment & Plan:   Problem List Items Addressed This Visit      Respiratory   Asthma     Digestive   GERD - Primary   Relevant Orders   CBC with Differential/Platelet (Completed)     Musculoskeletal and Integument   Osteoporosis   Relevant Orders   CMP14+EGFR (Completed)     Other   Mixed hyperlipidemia   Relevant Orders   Lipid panel (Completed)   Anxiety state   Depression, recurrent ([place])   Anemia, iron deficiency   Relevant Orders   CMP14+EGFR (Completed)      Patient is to continue current medicaiton, return in 6 months or sooner if needs refill for valium Follow up plan: Return in about 6 months (around 09/24/2019), or if symptoms worsen or fail to improve, for hyperlipidemia.  Counseling provided for all of the vaccine components Orders Placed This Encounter  Procedures  . CBC with Differential/Platelet  . CMP14+EGFR  . Lipid panel    Caryl Pina, MD [facility] 03/24/2019, 2:21 PM

## 2019-03-25 LAB — CBC WITH DIFFERENTIAL/PLATELET
Basophils Absolute: 0 10*3/uL (ref 0.0–0.2)
Basos: 1 %
EOS (ABSOLUTE): 0.2 10*3/uL (ref 0.0–0.4)
Eos: 2 %
Hematocrit: 41.4 % (ref 34.0–46.6)
Hemoglobin: 14 g/dL (ref 11.1–15.9)
Immature Grans (Abs): 0 10*3/uL (ref 0.0–0.1)
Immature Granulocytes: 1 %
Lymphocytes Absolute: 1.6 10*3/uL (ref 0.7–3.1)
Lymphs: 25 %
MCH: 29.7 pg (ref 26.6–33.0)
MCHC: 33.8 g/dL (ref 31.5–35.7)
MCV: 88 fL (ref 79–97)
Monocytes Absolute: 0.5 10*3/uL (ref 0.1–0.9)
Monocytes: 8 %
Neutrophils Absolute: 4.2 10*3/uL (ref 1.4–7.0)
Neutrophils: 63 %
Platelets: 163 10*3/uL (ref 150–450)
RBC: 4.71 x10E6/uL (ref 3.77–5.28)
RDW: 12.1 % (ref 11.7–15.4)
WBC: 6.6 10*3/uL (ref 3.4–10.8)

## 2019-03-25 LAB — CMP14+EGFR
ALT: 16 IU/L (ref 0–32)
AST: 23 IU/L (ref 0–40)
Albumin/Globulin Ratio: 2.1 (ref 1.2–2.2)
Albumin: 4.4 g/dL (ref 3.7–4.7)
Alkaline Phosphatase: 49 IU/L (ref 39–117)
BUN/Creatinine Ratio: 29 — ABNORMAL HIGH (ref 12–28)
BUN: 22 mg/dL (ref 8–27)
Bilirubin Total: 0.3 mg/dL (ref 0.0–1.2)
CO2: 25 mmol/L (ref 20–29)
Calcium: 9.8 mg/dL (ref 8.7–10.3)
Chloride: 101 mmol/L (ref 96–106)
Creatinine, Ser: 0.77 mg/dL (ref 0.57–1.00)
GFR calc Af Amer: 85 mL/min/{1.73_m2} (ref >59)
GFR calc non Af Amer: 74 mL/min/{1.73_m2} (ref >59)
Globulin, Total: 2.1 g/dL (ref 1.5–4.5)
Glucose: 97 mg/dL (ref 65–99)
Potassium: 4.4 mmol/L (ref 3.5–5.2)
Sodium: 140 mmol/L (ref 134–144)
Total Protein: 6.5 g/dL (ref 6.0–8.5)

## 2019-03-25 LAB — LIPID PANEL
Chol/HDL Ratio: 2.8 ratio (ref 0.0–4.4)
Cholesterol, Total: 172 mg/dL (ref 100–199)
HDL: 61 mg/dL (ref >39)
LDL Calculated: 83 mg/dL (ref 0–99)
Triglycerides: 141 mg/dL (ref 0–149)
VLDL Cholesterol Cal: 28 mg/dL (ref 5–40)

## 2019-04-06 ENCOUNTER — Telehealth: Payer: Self-pay | Admitting: *Deleted

## 2019-04-06 MED ORDER — ESOMEPRAZOLE MAGNESIUM 40 MG PO CPDR: DELAYED_RELEASE_CAPSULE | ORAL | 1 refills | Status: DC

## 2019-04-06 MED ORDER — MONTELUKAST SODIUM 10 MG PO TABS: ORAL_TABLET | ORAL | 1 refills | Status: DC

## 2019-04-06 MED ORDER — METOPROLOL TARTRATE 25 MG PO TABS: 25.0000 mg | ORAL_TABLET | Freq: Every day | ORAL | 1 refills | Status: DC | PRN

## 2019-04-06 MED ORDER — SIMVASTATIN 10 MG PO TABS: 10.0000 mg | ORAL_TABLET | Freq: Every day | ORAL | 1 refills | Status: DC

## 2019-04-06 NOTE — Telephone Encounter (Signed)
Patient aware.

## 2019-04-06 NOTE — Telephone Encounter (Signed)
TC w/ patient and SW to get her medications through [facility] mail order Rxs for recent meds of Esomeprazole, Metoprolol, Montelukast & Simvastatin sent Diazepam was also RFd at visit on 03/24/19, please advise or send to mail order

## 2019-04-06 NOTE — Telephone Encounter (Signed)
Okay to go ahead and send refills for S omeprazole and metoprolol and montelukast and simvastatin to her mail order, diazepam will have to be filled locally

## 2019-04-18 ENCOUNTER — Telehealth: Payer: Self-pay | Admitting: *Deleted

## 2019-04-18 NOTE — Telephone Encounter (Signed)
Fax from [place] clarification on sig for Metoprolol 25 mg Please verify directions on Rx dated 04/06/19, AS NEEDED? Usually takes QD not on a PRN basis

## 2019-04-19 ENCOUNTER — Ambulatory Visit (INDEPENDENT_AMBULATORY_CARE_PROVIDER_SITE_OTHER): Payer: Medicare HMO | Admitting: Physical Medicine and Rehabilitation

## 2019-04-19 DIAGNOSIS — M47812 Spondylosis without myelopathy or radiculopathy, cervical region: Secondary | ICD-10-CM

## 2019-04-19 DIAGNOSIS — Z9889 Other specified postprocedural states: Secondary | ICD-10-CM | POA: Diagnosis not present

## 2019-04-19 DIAGNOSIS — M4802 Spinal stenosis, cervical region: Secondary | ICD-10-CM | POA: Diagnosis not present

## 2019-04-19 DIAGNOSIS — M79641 Pain in right hand: Secondary | ICD-10-CM

## 2019-04-19 MED ORDER — METOPROLOL TARTRATE 25 MG PO TABS: 25.0000 mg | ORAL_TABLET | Freq: Every day | ORAL | 1 refills | Status: DC

## 2019-04-19 NOTE — Progress Notes (Signed)
  Numeric Pain Rating Scale and Functional Assessment Average Pain 7 Pain Right Now 5 My pain is constant, stabbing and aching Pain is worse with: some activites Pain improves with: rest and heat/ice   In the last MONTH (on 0-10 scale) has pain interfered with the following?  1. General activity like being  able to carry out your everyday physical activities such as walking, climbing stairs, carrying groceries, or moving a chair?  Rating(5)  2. Relation with others like being able to carry out your usual social activities and roles such as  activities at home, at work and in your community. Rating(4)  3. Enjoyment of life such that you have  been bothered by emotional problems such as feeling anxious, depressed or irritable?  Rating(4)

## 2019-04-19 NOTE — Telephone Encounter (Signed)
I changed prescription to the daily

## 2019-04-19 NOTE — Addendum Note (Signed)
Addended by: Caryl Pina on: 04/19/2019 01:13 PM   Modules accepted: Orders

## 2019-04-25 ENCOUNTER — Telehealth: Payer: Self-pay | Admitting: *Deleted

## 2019-04-25 DIAGNOSIS — H3530 Unspecified macular degeneration: Secondary | ICD-10-CM

## 2019-04-25 DIAGNOSIS — H548 Legal blindness, as defined in USA: Secondary | ICD-10-CM

## 2019-04-25 NOTE — Telephone Encounter (Signed)
TC w/ Porsche from [place] and Pt's SW Eboni w/ [facility] [phone]) needing a code for script talk or bottle reader This was talk about with patient during her last visit This would need to go to fax number [phone] and coordinate w/ patients social worker Eboni Please advise

## 2019-04-26 ENCOUNTER — Telehealth: Payer: Self-pay | Admitting: Physical Medicine and Rehabilitation

## 2019-04-27 ENCOUNTER — Encounter: Payer: Self-pay | Admitting: Physical Medicine and Rehabilitation

## 2019-04-27 NOTE — Telephone Encounter (Signed)
I printed out a DME prescription is on Krystal [name]'s desk

## 2019-04-27 NOTE — Addendum Note (Signed)
Addended by: Caryl Pina on: 04/27/2019 01:56 PM   Modules accepted: Orders

## 2019-04-27 NOTE — Progress Notes (Signed)
Krystal [name] - 78 y.o. female MRN [id number]  Date of birth: [birth year]  Office Visit Note: Visit Date: 04/19/2019 PCP: Dettinger, Fransisca Kaufmann, MD Referred by: Dettinger, Fransisca Kaufmann, MD  Subjective: Chief Complaint  Patient presents with   Right Hand - Pain   HPI: Krystal [name] is a 78 y.o. female who comes in today For evaluation management of possible cervical radiculitis and radiculopathy.  She comes in at the request of Dr. Meridee Score.  I have actually seen the patient on one other occasion when we completed electrodiagnostic study of her right upper limb in October 2019.  This showed mild to moderate median neuropathy without much in the way of radiculopathy.  Obviously this study would not rule out a radiculitis.  She actually had a prior electrodiagnostic study by Dr. Narda Amber at [facility] and this was in 2018 and this did show mild poly-radicular findings particularly at C5-6 region.  No real hard evidence of severe radiculopathy.  She went on to have carpal tunnel release by Dr. Meridee Score.  More recently in his follow-up with her she complains of neck pain with radicular pain in the arm and hand.  She comes in today however with pain complaints that are just her right hand.  She complains of pain without numbness or tingling.  She reports that there have been some spots that have popped up on her hand and she complains of pain in general on pretty global fashion.  She denies any left hand pain.  She says it bothers her when she tries to use her hand for anything.  She is not noting any specific weakness or paresthesia.  She does not endorse any neck pain whatsoever.  She says her neck is fine at this point.  She gets some pain in the forearm as well on the right.  She has not had any trigger fingers or catching sensation.  She did have MRI of the cervical spine performed and this is reviewed below and reviewed with the patient with spine models and cervical MRI imaging.   This does show multilevel findings which are fairly mild for her age but there is some foraminal narrowing particular at C5-6 on the right but no high-grade stenosis.  Review of Systems  Constitutional: Negative for chills, fever, malaise/fatigue and weight loss.  HENT: Negative for hearing loss and sinus pain.   Eyes: Negative for blurred vision, double vision and photophobia.  Respiratory: Negative for cough and shortness of breath.   Cardiovascular: Negative for chest pain, palpitations and leg swelling.  Gastrointestinal: Negative for abdominal pain, nausea and vomiting.  Genitourinary: Negative for flank pain.  Musculoskeletal: Negative for myalgias.       Right hand pain  Skin: Negative for itching and rash.  Neurological: Negative for tremors, focal weakness and weakness.  Endo/Heme/Allergies: Negative.   Psychiatric/Behavioral: Negative for depression.  All other systems reviewed and are negative.  Otherwise per HPI.  Assessment & Plan: Visit Diagnoses:  1. Pain in right hand   2. History of carpal tunnel release   3. Cervical spondylosis without myelopathy   4. Foraminal stenosis of cervical region     Plan: Findings:  Her right hand pain is somewhat problematic in the sense that I am not really sure exactly what is going on.  When she saw Dr. Sharol Given there was more neck pain and radicular type pain now is more just hand pain but without paresthesia.  She has had 2 electrodiagnostic  studies and cervical spine MRI.  Findings fairly mild overall.  She is had prior carpal tunnel release.  She does not endorse any paresthesias.  She endorses some spots on the hand and pain with the hand with movement.  We discussed injection type issues of the cervical spine and I am just not sure at this point that is warranted.  I think she should follow-up with Dr. Sharol Given let them look at her hand to see if there is anything there from an orthopedic standpoint that could cause hand pain without these  paresthesias.  If he really feels like it is radicular in nature I would at least look at one time and a very diagnostic nerve root block at see 6.  We can do this either in a side-lying position or supine.  Safety profile is fairly good with this particular for not really doing a complete epidural.  Otherwise could send to 1 of the other physicians in town and may be willing to do transforaminal approach at the surgery center.  At this point just inclined to have her follow-up with Dr. Sharol Given to see if this is more mechanical complaints with her hand and not her neck at this point.    Meds & Orders: No orders of the defined types were placed in this encounter.  No orders of the defined types were placed in this encounter.   Follow-up: No follow-ups on file.   Procedures: No procedures performed  No notes on file   Clinical History: MRI CERVICAL SPINE WITHOUT CONTRAST  TECHNIQUE: Multiplanar, multisequence MR imaging of the cervical spine was performed. No intravenous contrast was administered.  COMPARISON: X-ray 02/07/2019  FINDINGS: Alignment: Exaggerated cervical lordosis. No static listhesis.  Vertebrae: No fracture, evidence of discitis, or bone lesion.  Cord: Normal signal and morphology.  Posterior Fossa, vertebral arteries, paraspinal tissues: Negative.  Disc levels:  C2-C3: No significant disc protrusion, foraminal stenosis, or canal stenosis.  C3-C4: Small posterior disc osteophyte complex and mild bilateral facet arthrosis without foraminal or canal stenosis.  C4-C5: Posterior disc osteophyte complex, right worse than left facet arthrosis, and mild bilateral uncovertebral arthropathy results in mild right foraminal narrowing. No canal stenosis.  C5-C6: Small posterior disc osteophyte complex, right worse than left facet arthrosis, and mild bilateral uncovertebral arthropathy results in mild to moderate right sided foraminal narrowing. No canal  stenosis.  C6-C7: No significant disc protrusion, foraminal stenosis, or canal stenosis.  C7-T1: No significant disc protrusion, foraminal stenosis, or canal stenosis.  IMPRESSION: Mild multilevel cervical spondylosis with mild-to-moderate right-sided foraminal narrowing at C5-6 and mild right foraminal narrowing at C4-5. No significant canal stenosis.   Electronically Signed By: Davina Poke M.D. On: 03/09/2019 15:54 ------ Impression: EMG/NCS 05/25/2018 The above electrodiagnostic study is ABNORMAL and reveals evidence of a mild to moderate right median nerve entrapment at the wrist (carpal tunnel syndrome) affecting sensory and motor components.   There is no significant electrodiagnostic evidence of any other focal nerve entrapment, brachial plexopathy or cervical radiculopathy.   Recommendations: 1. Follow-up with referring physician. 2. Continue current management of symptoms. 3. Continue use of resting splint at night-time and as needed during the day.  ___________________________ Wonda Olds ----- Impression: EMG/NCS 03/10/2017 1. 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 2. 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 In the absence of demyelinating features, a polyradiculoneuropathy is thought to  be less likely. Neurological consultation for further evaluation is recommended.   ___________________________ Narda Amber, DO   She reports that she quit smoking about 58 years ago. Her smoking use included cigarettes. She has never used smokeless tobacco. No results for input(s): HGBA1C, LABURIC in the last 8760 hours.  Objective:  VS:  HT:     WT:    BMI:      BP:    HR: bpm   TEMP: ( )   RESP:  Physical Exam Vitals signs and nursing note reviewed.  Constitutional:       General: She is not in acute distress.    Appearance: Normal appearance. She is well-developed.  HENT:     Head: Normocephalic and atraumatic.     Nose: Nose normal.     Mouth/Throat:     Mouth: Mucous membranes are moist.     Pharynx: Oropharynx is clear.  Eyes:     Conjunctiva/sclera: Conjunctivae normal.     Pupils: Pupils are equal, round, and reactive to light.  Neck:     Musculoskeletal: Normal range of motion and neck supple. No neck rigidity or muscular tenderness.  Cardiovascular:     Rate and Rhythm: Regular rhythm.  Pulmonary:     Effort: Pulmonary effort is normal. No respiratory distress.  Abdominal:     General: There is no distension.     Palpations: Abdomen is soft.     Tenderness: There is no guarding.  Musculoskeletal:     Right lower leg: No edema.     Left lower leg: No edema.     Comments: Examination of both hands shows no specific atrophy of the APB hand intrinsic or FDI bilaterally.  She does have some osteoarthritic changes of the CMC joints.  She has no synovitis.  She has no swelling.  She has no color changes.  She appears to have good strength bilaterally with wrist extension long finger flexion and abduction.  She has intact sensation.  She has well-healed surgical scar on the right volar wrist.  Neck is forward flexed she does have some limited range of motion at end ranges but a negative Spurling's test.  Lymphadenopathy:     Cervical: No cervical adenopathy.  Skin:    General: Skin is warm and dry.     Findings: No erythema or rash.  Neurological:     General: No focal deficit present.     Mental Status: She is alert and oriented to person, place, and time.     Motor: No abnormal muscle tone.     Coordination: Coordination normal.     Gait: Gait normal.  Psychiatric:        Mood and Affect: Mood normal.        Behavior: Behavior normal.        Thought Content: Thought content normal.     Ortho Exam Imaging: No results found.  Past  Medical/Family/Surgical/Social History: Medications & Allergies reviewed per EMR, new medications updated. 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   Influenza with pneumonia 10/30/2015   Abdominal aortic atherosclerosis ([place]) 04/10/2015   PSVT (paroxysmal supraventricular tachycardia) ([place]) 05/10/2014   Macular degeneration, age related, nonexudative 08/24/2013   History of right mastectomy 07/13/2013   Anemia, iron deficiency 06/21/2013   Allergic rhinitis 06/21/2013   Chronic low back pain 06/21/2013   Malignant neoplasm  of female breast ([place]) 11/22/2007   Mixed hyperlipidemia 11/22/2007   Anxiety state 11/22/2007   Depression, recurrent ([place]) 11/22/2007   Asthma 11/22/2007   GERD 11/22/2007   DUODENITIS, WITHOUT HEMORRHAGE 08/14/2007   HIATAL HERNIA 08/14/2007   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   Past Medical History:  Diagnosis Date   Anemia    Asthma    Brain tumor ([place])    1986   Breast cancer ([place])    Right breast   Cataract    Colon polyps    COPD (chronic obstructive pulmonary disease) ([place])    Hiatal hernia    Hyperlipidemia    Leg edema    Macular degeneration    Osteoporosis    Pneumonia    PVD (posterior vitreous detachment)    Right knee pain    Shingles rash    Family History  Problem Relation Age of Onset   Heart disease Mother 33       Stroke    Dementia Mother    Congestive Heart Failure Mother    Osteoporosis Mother    Stroke Mother    Heart disease Sister    Hypertension Sister    Diabetes Sister    Osteoporosis Sister    Heart disease Brother    Hyperlipidemia Brother    Diabetes Brother    Heart disease Sister    Hypertension Sister    Diabetes Sister    Diabetes Sister    Hypertension Sister    Diabetes Sister    Hypertension Sister     Diabetes Brother    Hyperlipidemia Brother    Cancer Father        lung   Heart attack Father    Diabetes Brother    Heart disease Brother    Hyperlipidemia Brother    Diabetes Brother    Diabetes Son    Colon cancer Neg Hx    Rectal cancer Neg Hx    Stomach cancer Neg Hx    Esophageal cancer Neg Hx    Past Surgical History:  Procedure Laterality Date   CATARACT EXTRACTION, BILATERAL     COLONOSCOPY     CRANIECTOMY / CRANIOTOMY FOR EXCISION OF BRAIN TUMOR     Left side brain   MASTECTOMY Right    PARTIAL HYSTERECTOMY     POLYPECTOMY     SKIN CANCER EXCISION Left    Under left eye   Social History   Occupational History   Occupation: retired  Tobacco Use   Smoking status: Former Smoker    Types: Cigarettes    Quit date: 02/09/1961    Years since quitting: 58.2   Smokeless tobacco: Never Used  Substance and Sexual Activity   Alcohol use: No    Alcohol/week: 0.0 standard drinks   Drug use: No   Sexual activity: Not Currently

## 2019-04-27 NOTE — Telephone Encounter (Signed)
I do have the note dictated and what we had talked about as she was just having mainly hand pain without any neck pain or pain down the arms.  If her symptoms are going down the arm into the hand particular if there is any paresthesia or new tingling then a C6 nerve root block could be done.  At the time when I saw her she was having mainly hand pain and I thought at the time she should follow-up with Dr. Sharol Given for her hand but I did not feel like this was necessarily related to the cervical spine.  Her cervical spine has foraminal narrowing at one spot but is not bad she has really multilevel arthritis but again for her age it is not bad.  She has had 2 nerve conduction studies already.

## 2019-04-28 NOTE — Telephone Encounter (Signed)
I called Krystal [name] to confirm that her symptoms have not changed and offered to schedule her an appointment with Dr. Sharol Given. She was given a card during her OV on 9/15 which says she has an appointment on 9/29 at 1445 and will need a driver. I apologized to her for our mistake and explained that Dr. Ernestina Patches and Dr. Sharol Given are fully booked on that day and offered to schedule her an appointment on another day next week with Dr. Sharol Given. The Krystal [name] is very angry because she was given an appointment card but was not put on our schedule. She has already arranged for transportation which she now has to cancel. I apologized multiple times and offered to schedule her for an appointment, but she states that she is not going to make another appointment.

## 2019-05-04 DIAGNOSIS — H53413 Scotoma involving central area, bilateral: Secondary | ICD-10-CM | POA: Diagnosis not present

## 2019-05-10 DIAGNOSIS — H53413 Scotoma involving central area, bilateral: Secondary | ICD-10-CM | POA: Diagnosis not present

## 2019-05-17 DIAGNOSIS — R0602 Shortness of breath: Secondary | ICD-10-CM | POA: Insufficient documentation

## 2019-05-17 DIAGNOSIS — R0609 Other forms of dyspnea: Secondary | ICD-10-CM | POA: Insufficient documentation

## 2019-05-17 NOTE — Progress Notes (Signed)
Cardiology Office Note   Date:  05/18/2019   ID:  Krystal [name], DOB 06-19-1941, MRN [id number]  PCP:  Dettinger, Krystal Kaufmann, MD  Cardiologist:   No primary care provider on file.   Chief Complaint  Patient presents with  . Shortness of Breath      History of Present Illness: Krystal [name] is a 78 y.o. female who presents for follow up of SVT.   I saw her in July and she had some dyspnea.  She had a negative Lexiscan Myoview.    Since I last saw her she has continued to have increased dyspnea with exertion.  He said this really was not happening 3 or 4 months ago.  It started happening more in the summer when she was doing her usual chores.  She gets short of breath making the bed or mopping the floor.  She is not describing resting shortness of breath, PND or orthopnea.  Is not having any chest pressure, neck or arm discomfort.  She might rest for 15 minutes or so and her breathing will get better.  He said no new weight gain or swelling.  I did check of her TSH and CBC were fine.   Past Medical History:  Diagnosis Date  . Anemia   . Asthma   . Brain tumor ([place])    1986  . Breast cancer ([place])    Right breast  . Cataract   . Colon polyps   . COPD (chronic obstructive pulmonary disease) ([place])   . Hiatal hernia   . Hyperlipidemia   . Leg edema   . Macular degeneration   . Osteoporosis   . Pneumonia   . PVD (posterior vitreous detachment)   . Right knee pain   . Shingles rash     Past Surgical History:  Procedure Laterality Date  . CATARACT EXTRACTION, BILATERAL    . COLONOSCOPY    . CRANIECTOMY / CRANIOTOMY FOR EXCISION OF BRAIN TUMOR     Left side brain  . MASTECTOMY Right   . PARTIAL HYSTERECTOMY    . POLYPECTOMY    . SKIN CANCER EXCISION Left    Under left eye     Current Outpatient Medications  Medication Sig Dispense Refill  . albuterol (PROAIR HFA) 108 (90 Base) MCG/ACT inhaler INHALE TWO PUFFS BY MOUTH 4 TIMES DAILY AS NEEDED 9 g 2  .  aspirin EC 81 MG tablet Take 81 mg by mouth at bedtime.    . Calcium Citrate-Vitamin D (CALCIUM CITRATE + D) 315-250 MG-UNIT TABS Take 2 tablets by mouth daily. 120 tablet   . cetirizine (ZYRTEC) 10 MG tablet Take 5-10 mg by mouth at bedtime.     . Cholecalciferol (VITAMIN D) 2000 UNITS CAPS Take 1 capsule by mouth at bedtime.    . diazepam (VALIUM) 5 MG tablet TAKE 1 TABLET BY MOUTH EVERY 6 HOURS AS NEEDED 30 tablet 4  . diltiazem (CARDIZEM CD) 300 MG 24 hr capsule Take 1 capsule (300 mg total) by mouth daily. 90 capsule 3  . esomeprazole (NEXIUM) 40 MG capsule TAKE 1 CAPSULE BY MOUTH EVERY DAY 90 capsule 1  . ferrous sulfate 325 (65 FE) MG tablet Take 325 mg by mouth at bedtime.    Marland Kitchen ipratropium-albuterol (DUONEB) 0.5-2.5 (3) MG/3ML SOLN Take 3 mLs by nebulization every 6 (six) hours as needed. Dx:  Asthma 493.90 360 mL 2  . metoprolol tartrate (LOPRESSOR) 25 MG tablet Take 1 tablet (25 mg total) by  mouth daily. 90 tablet 1  . montelukast (SINGULAIR) 10 MG tablet TAKE 1 TABLET BY MOUTH EVERYDAY AT BEDTIME 90 tablet 1  . Multiple Vitamins-Minerals (PRESERVISION/LUTEIN PO) Take 1 capsule by mouth at bedtime.     . nitroGLYCERIN (NITROLINGUAL) 0.4 MG/SPRAY spray ONE SPRAY UNDER TONGUE AS NEED FOR CHESTPAIN. MAY REPEAT IN 5 MINUTES IF PAIN NOT RELIEVED CALL 911. 12 g 3  . PROLIA 60 MG/ML SOSY injection TO BE ADMINISTERED IN PHYSICIAN'S OFFICE. INJECT ONE SYRINGE SUBCOUSLY ONCE EVERY 6 MONTHS. REFRIGERATE. USE WITHIN 14 DAYS ONCE AT ROOM TEMPERATURE. 1 each 0  . RESTASIS 0.05 % ophthalmic emulsion Place 1 drop into both eyes 2 (two) times daily.  4  . simvastatin (ZOCOR) 10 MG tablet Take 1 tablet (10 mg total) by mouth daily. 90 tablet 1   No current facility-administered medications for this visit.     Allergies:   Actonel [risedronate], Fosamax [alendronate sodium], Mevacor [lovastatin], Miacalcin [calcitonin (salmon)], and Mobic [meloxicam]    ROS:  Please see the history of present illness.    Otherwise, review of systems are positive for none.   All other systems are reviewed and negative.    PHYSICAL EXAM: VS:  BP 120/68   Pulse 88   Ht 5\' 3"  (1.6 m)   Wt 170 lb (77.1 kg)   BMI 30.11 kg/m  , BMI Body mass index is 30.11 kg/m. GENERAL:  Well appearing NECK:  No jugular venous distention, waveform within normal limits, carotid upstroke brisk and symmetric, no bruits, no thyromegaly LUNGS:  Clear to auscultation bilaterally CHEST:  Unremarkable HEART:  PMI not displaced or sustained,S1 and S2 within normal limits, no S3, no S4, no clicks, no rubs, 3 out of 6 apical nonradiating systolic murmur, no diastolic murmurs ABD:  Flat, positive bowel sounds normal in frequency in pitch, no bruits, no rebound, no guarding, no midline pulsatile mass, no hepatomegaly, no splenomegaly EXT:  2 plus pulses throughout, no edema, no cyanosis no clubbing   EKG:  EKG is not ordered today.   Recent Labs: 03/24/2019: ALT 16; BUN 22; Creatinine, Ser 0.77; Hemoglobin 14.0; Platelets 163; Potassium 4.4; Sodium 140    Lipid Panel    Component Value Date/Time   CHOL 172 03/24/2019 1430   CHOL 152 03/04/2013 1212   TRIG 141 03/24/2019 1430   TRIG 93 04/08/2016 1158   TRIG 97 03/04/2013 1212   HDL 61 03/24/2019 1430   HDL 68 04/08/2016 1158   HDL 62 03/04/2013 1212   CHOLHDL 2.8 03/24/2019 1430   LDLCALC 83 03/24/2019 1430   LDLCALC 75 01/27/2014 1147   LDLCALC 71 03/04/2013 1212      Wt Readings from Last 3 Encounters:  05/18/19 170 lb (77.1 kg)  03/24/19 166 lb (75.3 kg)  03/02/19 166 lb (75.3 kg)      Other studies Reviewed: Additional studies/ records that were reviewed today include: Lexiscan Myoview. Review of the above records demonstrates:  Please see elsewhere in the note.     ASSESSMENT AND PLAN:  SVT:     She is had no further dysrhythmias.  No change in therapy.   DYSLIPIDEMIA:    LDL was 83.  Continue the meds as listed.  HDL was 61.   DYSPNEA:    This is her  biggest complaint and it is relatively acute.  There was no suggestion of ischemia and I believe this to be a true negative on her Lexiscan.  Next check a BNP level and an  echocardiogram.  If this were to continue I would then consider right and left heart cath.    Current medicines are reviewed at length with the patient today.  The patient does not have concerns regarding medicines.  The following changes have been made:None   Labs/ tests ordered today include:   Orders Placed This Encounter  Procedures  . Pro b natriuretic peptide  . ECHOCARDIOGRAM COMPLETE     Disposition:   FU with me after the echo.   Signed, Minus Breeding, MD  05/18/2019 1:48 PM    [facility]

## 2019-05-18 ENCOUNTER — Encounter: Payer: Self-pay | Admitting: Cardiology

## 2019-05-18 ENCOUNTER — Other Ambulatory Visit: Payer: Self-pay

## 2019-05-18 ENCOUNTER — Other Ambulatory Visit: Payer: Medicare HMO

## 2019-05-18 ENCOUNTER — Ambulatory Visit (INDEPENDENT_AMBULATORY_CARE_PROVIDER_SITE_OTHER): Payer: Medicare HMO | Admitting: Cardiology

## 2019-05-18 ENCOUNTER — Telehealth: Payer: Self-pay | Admitting: *Deleted

## 2019-05-18 VITALS — BP 120/68 | HR 88 | Ht 63.0 in | Wt 170.0 lb

## 2019-05-18 DIAGNOSIS — R0602 Shortness of breath: Secondary | ICD-10-CM

## 2019-05-18 DIAGNOSIS — E785 Hyperlipidemia, unspecified: Secondary | ICD-10-CM | POA: Diagnosis not present

## 2019-05-18 DIAGNOSIS — I471 Supraventricular tachycardia: Secondary | ICD-10-CM

## 2019-05-18 NOTE — Telephone Encounter (Addendum)
Prior Auth for Diazepam 5mg  tab-APPROVED  08/04/2018 - 06/17/2019   Key: [id number] -   PA Case ID: [id number]  Your information has been submitted to [employer]. [employer] will review the request and will issue a decision, typically within 1-3 days from your submission. You can check the updated outcome later by reopening this request.  If [employer] has not responded in 1-3 days or if you have any questions about your ePA request, please contact [place] [employer] Part D at [phone]. If you think there may be a problem with your PA request, use our live chat feature at the bottom right.  Pharmacy notified

## 2019-05-18 NOTE — Patient Instructions (Signed)
Medication Instructions:  The current medical regimen is effective;  continue present plan and medications.  If you need a refill on your cardiac medications before your next appointment, please call your pharmacy.   Lab work: Please have blood work today at [facility] (pro-BNP)  If you have labs (blood work) drawn today and your tests are completely normal, you will receive your results only by: Marland Kitchen MyChart Message (if you have MyChart) OR . A paper copy in the mail If you have any lab test that is abnormal or we need to change your treatment, we will call you to review the results.  Testing/Procedures: Your physician has requested that you have an echocardiogram. Echocardiography is a painless test that uses sound waves to create images of your heart. It provides your doctor with information about the size and shape of your heart and how well your heart's chambers and valves are working. This procedure takes approximately one hour. There are no restrictions for this procedure. This will be completed at [facility].  Please check in at the main entrance.  Follow-Up: Follow up in 6 months with Dr. Percival Spanish.  You will receive a letter in the mail 2 months before you are due.  Please call us when you receive this letter to schedule your follow up appointment.  Echocardiogram An echocardiogram is a procedure that uses painless sound waves (ultrasound) to produce an image of the heart. Images from an echocardiogram can provide important information about:  Signs of coronary artery disease (CAD).  Aneurysm detection. An aneurysm is a weak or damaged part of an artery wall that bulges out from the normal force of blood pumping through the body.  Heart size and shape. Changes in the size or shape of the heart can be associated with certain conditions, including heart failure, aneurysm, and CAD.  Heart muscle function.  Heart valve function.  Signs of a past heart attack.  Fluid buildup  around the heart.  Thickening of the heart muscle.  A tumor or infectious growth around the heart valves. Tell a health care provider about:  Any allergies you have.  All medicines you are taking, including vitamins, herbs, eye drops, creams, and over-the-counter medicines.  Any blood disorders you have.  Any surgeries you have had.  Any medical conditions you have.  Whether you are pregnant or may be pregnant. What are the risks? Generally, this is a safe procedure. However, problems may occur, including:  Allergic reaction to dye (contrast) that may be used during the procedure. What happens before the procedure? No specific preparation is needed. You may eat and drink normally. What happens during the procedure?   An IV tube may be inserted into one of your veins.  You may receive contrast through this tube. A contrast is an injection that improves the quality of the pictures from your heart.  A gel will be applied to your chest.  A wand-like tool (transducer) will be moved over your chest. The gel will help to transmit the sound waves from the transducer.  The sound waves will harmlessly bounce off of your heart to allow the heart images to be captured in real-time motion. The images will be recorded on a computer. The procedure may vary among health care providers and hospitals. What happens after the procedure?  You may return to your normal, everyday life, including diet, activities, and medicines, unless your health care provider tells you not to do that. Summary  An echocardiogram is a procedure  that uses painless sound waves (ultrasound) to produce an image of the heart.  Images from an echocardiogram can provide important information about the size and shape of your heart, heart muscle function, heart valve function, and fluid buildup around your heart.  You do not need to do anything to prepare before this procedure. You may eat and drink normally.  After  the echocardiogram is completed, you may return to your normal, everyday life, unless your health care provider tells you not to do that. This information is not intended to replace advice given to you by your health care provider. Make sure you discuss any questions you have with your health care provider. Document Released: 07/18/2000 Document Revised: 11/11/2018 Document Reviewed: 08/23/2016 Elsevier Patient Education  2020 [employer].  Thank you for choosing [facility]!!

## 2019-05-19 LAB — PRO B NATRIURETIC PEPTIDE: NT-Pro BNP: 154 pg/mL (ref 0–738)

## 2019-05-24 DIAGNOSIS — H35373 Puckering of macula, bilateral: Secondary | ICD-10-CM | POA: Diagnosis not present

## 2019-05-24 DIAGNOSIS — H353124 Nonexudative age-related macular degeneration, left eye, advanced atrophic with subfoveal involvement: Secondary | ICD-10-CM | POA: Diagnosis not present

## 2019-05-24 DIAGNOSIS — H353211 Exudative age-related macular degeneration, right eye, with active choroidal neovascularization: Secondary | ICD-10-CM | POA: Diagnosis not present

## 2019-05-24 DIAGNOSIS — H43813 Vitreous degeneration, bilateral: Secondary | ICD-10-CM | POA: Diagnosis not present

## 2019-05-26 DIAGNOSIS — H53413 Scotoma involving central area, bilateral: Secondary | ICD-10-CM | POA: Diagnosis not present

## 2019-05-30 ENCOUNTER — Ambulatory Visit (HOSPITAL_COMMUNITY)
Admission: RE | Admit: 2019-05-30 | Discharge: 2019-05-30 | Disposition: A | Discharge status: Home / Self Care | Payer: Medicare HMO | Source: Ambulatory Visit | Attending: Cardiology | Admitting: Cardiology

## 2019-05-30 ENCOUNTER — Other Ambulatory Visit: Payer: Self-pay

## 2019-05-30 DIAGNOSIS — R0602 Shortness of breath: Secondary | ICD-10-CM | POA: Diagnosis not present

## 2019-05-30 NOTE — Progress Notes (Signed)
*  PRELIMINARY RESULTS* Echocardiogram 2D Echocardiogram has been performed.  Samuel Germany 05/30/2019, 2:13 PM

## 2019-05-31 ENCOUNTER — Telehealth: Payer: Self-pay

## 2019-05-31 NOTE — Telephone Encounter (Signed)
Called and left message for lab results.

## 2019-06-01 NOTE — Telephone Encounter (Signed)
Result Notes for Pro b natriuretic peptide  Notes recorded by Minus Breeding, MD on 05/19/2019 at 10:12 AM EDT  Mildly elevated BNP. I am waiting for an echo to be completed.     Echo results available for Dr. Percival Spanish to review

## 2019-06-01 NOTE — Telephone Encounter (Signed)
Patient is returning call for results. 

## 2019-06-01 NOTE — Telephone Encounter (Signed)
This patient had an Echo 05/30/19 at [facility].  Does she need to be scheduled for one in the future?

## 2019-06-01 NOTE — Telephone Encounter (Signed)
Pt aware of result note for BNP lab. She states she received a call from our office regarding setting up her echo and did attempt to return call. Informed her that message would be routed to scheduling to call her to set this up. She verbalized understanding

## 2019-06-06 NOTE — Telephone Encounter (Signed)
Gave pt echo results. Verbalized understanding.

## 2019-06-06 NOTE — Telephone Encounter (Signed)
Follow Up  Patient is calling in about results of echo. Please give patient a call back.

## 2019-06-06 NOTE — Telephone Encounter (Signed)
Routed to MD to advise on echo results

## 2019-06-10 DIAGNOSIS — H53413 Scotoma involving central area, bilateral: Secondary | ICD-10-CM | POA: Diagnosis not present

## 2019-06-16 ENCOUNTER — Ambulatory Visit (INDEPENDENT_AMBULATORY_CARE_PROVIDER_SITE_OTHER): Payer: Medicare HMO | Admitting: Nurse Practitioner

## 2019-06-16 ENCOUNTER — Other Ambulatory Visit: Payer: Self-pay

## 2019-06-16 ENCOUNTER — Encounter: Payer: Self-pay | Admitting: Nurse Practitioner

## 2019-06-16 DIAGNOSIS — J40 Bronchitis, not specified as acute or chronic: Secondary | ICD-10-CM | POA: Diagnosis not present

## 2019-06-16 MED ORDER — AMOXICILLIN-POT CLAVULANATE 875-125 MG PO TABS: 1.0000 | ORAL_TABLET | Freq: Two times a day (BID) | ORAL | 0 refills | Status: DC

## 2019-06-16 MED ORDER — BENZONATATE 100 MG PO CAPS: 100.0000 mg | ORAL_CAPSULE | Freq: Three times a day (TID) | ORAL | 0 refills | Status: DC | PRN

## 2019-06-16 NOTE — Progress Notes (Signed)
Virtual Visit via telephone Note Due to COVID-19 pandemic this visit was conducted virtually. This visit type was conducted due to national recommendations for restrictions regarding the COVID-19 Pandemic (e.g. social distancing, sheltering in place) in an effort to limit this patient's exposure and mitigate transmission in our community. All issues noted in this document were discussed and addressed.  A physical exam was not performed with this format.  I connected with Krystal [name] on 06/16/19 at 4:55 by telephone and verified that I am speaking with the correct person using two identifiers. Krystal [name] is currently located at home and no one is currently with  her during visit. The provider, Mary-Margaret Hassell Done, FNP is located in their office at time of visit.  I discussed the limitations, risks, security and privacy concerns of performing an evaluation and management service by telephone and the availability of in person appointments. I also discussed with the patient that there may be a patient responsible charge related to this service. The patient expressed understanding and agreed to proceed.   History and Present Illness:   Chief Complaint: Bronchitis   HPI Patient calls in c/o cough that is dry and tight. Stated yesterday. She denies any sob or fever. She gets this every year and easily truns into pneumonia   Review of Systems  Constitutional: Negative for chills and fever.  HENT: Positive for congestion. Negative for ear pain, sinus pain and sore throat.   Respiratory: Positive for cough. Negative for shortness of breath and stridor.   Cardiovascular: Negative.   Neurological: Positive for dizziness and headaches.  Psychiatric/Behavioral: Negative.   All other systems reviewed and are negative.    Observations/Objective: Alert and oriented- answers all questions appropriately No distress Horse Dry hacky cough  Assessment and Plan: *Krystal [name]  in today with chief complaint of Bronchitis   1. Bronchitis 1. Take meds as prescribed 2. Use a cool mist humidifier especially during the winter months and when heat has been humid. 3. Use saline nose sprays frequently 4. Saline irrigations of the nose can be very helpful if done frequently.  * 4X daily for 1 week*  * Use of a nettie pot can be helpful with this. Follow directions with this* 5. Drink plenty of fluids 6. Keep thermostat turn down low 7.For any cough or congestion- tessalon perles as rx 8. For fever or aces or pains- take tylenol or ibuprofen appropriate for age and weight.  * for fevers greater than 101 orally you may alternate ibuprofen and tylenol every  3 hours.   Meds ordered this encounter  Medications  . amoxicillin-clavulanate (AUGMENTIN) 875-125 MG tablet    Sig: Take 1 tablet by mouth 2 (two) times daily.    Dispense:  14 tablet    Refill:  0    Order Specific Question:   Supervising Provider    Answer:   Caryl Pina A [id number]  . benzonatate (TESSALON PERLES) 100 MG capsule    Sig: Take 1 capsule (100 mg total) by mouth 3 (three) times daily as needed for cough.    Dispense:  20 capsule    Refill:  0    Order Specific Question:   Supervising Provider    Answer:   Caryl Pina A [id number]     Follow Up Instructions: prn    I discussed the assessment and treatment plan with the patient. The patient was provided an opportunity to ask questions and all were answered. The patient agreed with the  plan and demonstrated an understanding of the instructions.   The patient was advised to call back or seek an in-person evaluation if the symptoms worsen or if the condition fails to improve as anticipated.  The above assessment and management plan was discussed with the patient. The patient verbalized understanding of and has agreed to the management plan. Patient is aware to call the clinic if symptoms persist or worsen. Patient is aware when to  return to the clinic for a follow-up visit. Patient educated on when it is appropriate to go to the emergency department.   Time call ended:  5:08  I provided 13 minutes of non-face-to-face time during this encounter.    Mary-Margaret Hassell Done, FNP

## 2019-06-21 DIAGNOSIS — H35373 Puckering of macula, bilateral: Secondary | ICD-10-CM | POA: Diagnosis not present

## 2019-06-21 DIAGNOSIS — H353124 Nonexudative age-related macular degeneration, left eye, advanced atrophic with subfoveal involvement: Secondary | ICD-10-CM | POA: Diagnosis not present

## 2019-06-21 DIAGNOSIS — H353211 Exudative age-related macular degeneration, right eye, with active choroidal neovascularization: Secondary | ICD-10-CM | POA: Diagnosis not present

## 2019-07-01 ENCOUNTER — Other Ambulatory Visit: Payer: Self-pay | Admitting: Family Medicine

## 2019-07-07 ENCOUNTER — Telehealth: Payer: Self-pay | Admitting: Family Medicine

## 2019-07-07 NOTE — Telephone Encounter (Signed)
Patient is not due for Prolia until January.  Explained to patient that we would do a benefit verification for her Prolia and send it to her pharmacy when it is time for her next injection.

## 2019-07-19 DIAGNOSIS — H35373 Puckering of macula, bilateral: Secondary | ICD-10-CM | POA: Diagnosis not present

## 2019-07-19 DIAGNOSIS — H353124 Nonexudative age-related macular degeneration, left eye, advanced atrophic with subfoveal involvement: Secondary | ICD-10-CM | POA: Diagnosis not present

## 2019-07-19 DIAGNOSIS — H353211 Exudative age-related macular degeneration, right eye, with active choroidal neovascularization: Secondary | ICD-10-CM | POA: Diagnosis not present

## 2019-08-08 DIAGNOSIS — Z7189 Other specified counseling: Secondary | ICD-10-CM | POA: Insufficient documentation

## 2019-08-08 NOTE — Progress Notes (Signed)
Cardiology Office Note   Date:  08/10/2019   ID:  Krystal [name], DOB March 16, 1941, MRN [id number]  PCP:  Dettinger, Fransisca Kaufmann, MD  Cardiologist:   No primary care provider on file.   Chief Complaint  Patient presents with  . Chest Pain      History of Present Illness: Krystal [name] is a 79 y.o. female who presents for follow up of SVT.   I saw her in July and she had some dyspnea.  She had a negative Lexiscan Myoview.    She had normal LV function on echo recently.  ProBNP was normal.  Unfortunately she still continues to get shortness of breath with activity such as cleaning the house.  She gets chest discomfort with this is well.  It is midsternal.  There is no radiation to the jaw or to the arms.  She is not describing nausea vomiting or diaphoresis.  It can be 8 out of 10 in intensity and she has to go lie down.  It might take 15 minutes to 1/2-hour to go away.  She says the pattern is relatively stable with exertion and she is not describing any at rest.  She has not had any PND or orthopnea.  She has had no new palpitations, presyncope or syncope.   Past Medical History:  Diagnosis Date  . Anemia   . Asthma   . Brain tumor ([place])    1986  . Breast cancer ([place])    Right breast  . Cataract   . Colon polyps   . COPD (chronic obstructive pulmonary disease) ([place])   . Hiatal hernia   . Hyperlipidemia   . Leg edema   . Macular degeneration   . Osteoporosis   . Pneumonia   . PVD (posterior vitreous detachment)   . Right knee pain   . Shingles rash     Past Surgical History:  Procedure Laterality Date  . CATARACT EXTRACTION, BILATERAL    . COLONOSCOPY    . CRANIECTOMY / CRANIOTOMY FOR EXCISION OF BRAIN TUMOR     Left side brain  . MASTECTOMY Right   . PARTIAL HYSTERECTOMY    . POLYPECTOMY    . SKIN CANCER EXCISION Left    Under left eye     Current Outpatient Medications  Medication Sig Dispense Refill  . albuterol (PROAIR HFA) 108 (90 Base) MCG/ACT  inhaler INHALE TWO PUFFS BY MOUTH 4 TIMES DAILY AS NEEDED 9 g 2  . aspirin EC 81 MG tablet Take 81 mg by mouth at bedtime.    . Calcium Citrate-Vitamin D (CALCIUM CITRATE + D) 315-250 MG-UNIT TABS Take 2 tablets by mouth daily. 120 tablet   . cetirizine (ZYRTEC) 10 MG tablet Take 5-10 mg by mouth at bedtime.     . Cholecalciferol (VITAMIN D) 2000 UNITS CAPS Take 1 capsule by mouth at bedtime.    . diazepam (VALIUM) 5 MG tablet TAKE 1 TABLET BY MOUTH EVERY 6 HOURS AS NEEDED 30 tablet 4  . diltiazem (CARDIZEM CD) 300 MG 24 hr capsule Take 1 capsule (300 mg total) by mouth daily. 90 capsule 3  . esomeprazole (NEXIUM) 40 MG capsule TAKE 1 CAPSULE BY MOUTH EVERY DAY 90 capsule 1  . ferrous sulfate 325 (65 FE) MG tablet Take 325 mg by mouth at bedtime.    Marland Kitchen ipratropium-albuterol (DUONEB) 0.5-2.5 (3) MG/3ML SOLN Take 3 mLs by nebulization every 6 (six) hours as needed. Dx:  Asthma 493.90 360 mL 2  .  metoprolol tartrate (LOPRESSOR) 25 MG tablet Take 1 tablet (25 mg total) by mouth daily. 90 tablet 1  . montelukast (SINGULAIR) 10 MG tablet TAKE 1 TABLET BY MOUTH EVERYDAY AT BEDTIME 90 tablet 1  . Multiple Vitamins-Minerals (PRESERVISION/LUTEIN PO) Take 1 capsule by mouth at bedtime.     . nitroGLYCERIN (NITROLINGUAL) 0.4 MG/SPRAY spray ONE SPRAY UNDER TONGUE AS NEED FOR CHESTPAIN. MAY REPEAT IN 5 MINUTES IF PAIN NOT RELIEVED CALL 911. 12 g 3  . RESTASIS 0.05 % ophthalmic emulsion Place 1 drop into both eyes 2 (two) times daily.  4  . simvastatin (ZOCOR) 10 MG tablet TAKE 1 TABLET BY MOUTH EVERY DAY 90 tablet 1  . benzonatate (TESSALON PERLES) 100 MG capsule Take 1 capsule (100 mg total) by mouth 3 (three) times daily as needed for cough. 20 capsule 0  . metoprolol tartrate (LOPRESSOR) 50 MG tablet Take 1 tablet (50 mg total) by mouth once for 1 dose. Take 1 tablet 2 hours before your CT scan 1 tablet 0  . PROLIA 60 MG/ML SOSY injection TO BE ADMINISTERED IN PHYSICIAN'S OFFICE. INJECT ONE SYRINGE SUBCOUSLY  ONCE EVERY 6 MONTHS. REFRIGERATE. USE WITHIN 14 DAYS ONCE AT ROOM TEMPERATURE. 1 each 0   No current facility-administered medications for this visit.    Allergies:   Actonel [risedronate], Fosamax [alendronate sodium], Mevacor [lovastatin], Miacalcin [calcitonin (salmon)], and Mobic [meloxicam]    ROS:  Please see the history of present illness.   Otherwise, review of systems are positive for none.   All other systems are reviewed and negative.    PHYSICAL EXAM: VS:  BP 120/64   Pulse 88   Ht 5\' 3"  (1.6 m)   Wt 170 lb (77.1 kg)   BMI 30.11 kg/m  , BMI Body mass index is 30.11 kg/m. GENERAL:  Well appearing NECK:  No jugular venous distention, waveform within normal limits, carotid upstroke brisk and symmetric, no bruits, no thyromegaly LUNGS:  Clear to auscultation bilaterally CHEST:  Unremarkable HEART:  PMI not displaced or sustained,S1 and S2 within normal limits, no S3, no S4, no clicks, no rubs, 3 out of 6 apical systolic murmur, no diastolic no murmurs ABD:  Flat, positive bowel sounds normal in frequency in pitch, no bruits, no rebound, no guarding, no midline pulsatile mass, no hepatomegaly, no splenomegaly EXT:  2 plus pulses throughout, no edema, no cyanosis no clubbing   EKG:  EKG is  ordered today. Sinus rhythm, rate 88, axis within normal limits, intervals within normal limits, no acute ST-T wave changes.  Recent Labs: 03/24/2019: ALT 16; BUN 22; Creatinine, Ser 0.77; Hemoglobin 14.0; Platelets 163; Potassium 4.4; Sodium 140 05/18/2019: NT-Pro BNP 154    Lipid Panel    Component Value Date/Time   CHOL 172 03/24/2019 1430   CHOL 152 03/04/2013 1212   TRIG 141 03/24/2019 1430   TRIG 93 04/08/2016 1158   TRIG 97 03/04/2013 1212   HDL 61 03/24/2019 1430   HDL 68 04/08/2016 1158   HDL 62 03/04/2013 1212   CHOLHDL 2.8 03/24/2019 1430   LDLCALC 83 03/24/2019 1430   LDLCALC 75 01/27/2014 1147   LDLCALC 71 03/04/2013 1212      Wt Readings from Last 3  Encounters:  08/10/19 170 lb (77.1 kg)  05/18/19 170 lb (77.1 kg)  03/24/19 166 lb (75.3 kg)      Other studies Reviewed: Additional studies/ records that were reviewed today include:  Echo, BNP and Lexiscan Myoview. Review of the above records demonstrates:  Please see elsewhere in the note.     ASSESSMENT AND PLAN:  SVT:     She has not had any further significant symptoms to suggest arrhythmia.  No change in therapy.  CHEST PAIN: Given the ongoing nature of her chest pain I think this needs to be further evaluated.  I would like to avoid invasive hospital study so I will not order a catheterization but will try to start with CT coronary angiography.  DYSPNEA:    This is her biggest complaint and it is relatively acute.  There was no suggestion of ischemia and I believe this to be a true negative on her Lexiscan.  BNP and echo were unremarkable.  This will be evaluated as above to make sure this is not an anginal equivalent.  COVID EDCUATION: She would like to get the vaccine but is not offered yet in [facility].   Current medicines are reviewed at length with the patient today.  The patient does not have concerns regarding medicines.  The following changes have been made:None   Labs/ tests ordered today include:   Orders Placed This Encounter  Procedures  . CT CORONARY MORPH W/CTA COR W/SCORE W/CA W/CM &/OR WO/CM  . CT CORONARY FRACTIONAL FLOW RESERVE DATA PREP  . CT CORONARY FRACTIONAL FLOW RESERVE FLUID ANALYSIS  . Basic Metabolic Panel (BMET)  . EKG 12-Lead     Disposition:   FU with me after the echo.   Signed, Minus Breeding, MD  08/10/2019 2:03 PM    [place]

## 2019-08-09 ENCOUNTER — Telehealth: Payer: Self-pay | Admitting: Cardiology

## 2019-08-09 NOTE — Telephone Encounter (Signed)

## 2019-08-10 ENCOUNTER — Encounter: Payer: Self-pay | Admitting: Cardiology

## 2019-08-10 ENCOUNTER — Ambulatory Visit (INDEPENDENT_AMBULATORY_CARE_PROVIDER_SITE_OTHER): Payer: Medicare HMO | Admitting: Cardiology

## 2019-08-10 ENCOUNTER — Other Ambulatory Visit: Payer: Self-pay

## 2019-08-10 VITALS — BP 120/64 | HR 88 | Ht 63.0 in | Wt 170.0 lb

## 2019-08-10 DIAGNOSIS — I471 Supraventricular tachycardia: Secondary | ICD-10-CM

## 2019-08-10 DIAGNOSIS — Z01812 Encounter for preprocedural laboratory examination: Secondary | ICD-10-CM

## 2019-08-10 DIAGNOSIS — R0602 Shortness of breath: Secondary | ICD-10-CM

## 2019-08-10 DIAGNOSIS — Z7189 Other specified counseling: Secondary | ICD-10-CM

## 2019-08-10 DIAGNOSIS — E785 Hyperlipidemia, unspecified: Secondary | ICD-10-CM | POA: Diagnosis not present

## 2019-08-10 MED ORDER — METOPROLOL TARTRATE 50 MG PO TABS: 50.0000 mg | ORAL_TABLET | Freq: Once | ORAL | 0 refills | Status: DC

## 2019-08-10 NOTE — Patient Instructions (Addendum)
Medication Instructions:  The current medical regimen is effective;  continue present plan and medications.  *If you need a refill on your cardiac medications before your next appointment, please call your pharmacy*  Lab Work: Please have blood work before your CT scan (BMP)  This will be scheduled once the CT has been scheduled.  You can have this completed at [facility].  If you have labs (blood work) drawn today and your tests are completely normal, you will receive your results only by: Marland Kitchen MyChart Message (if you have MyChart) OR . A paper copy in the mail If you have any lab test that is abnormal or we need to change your treatment, we will call you to review the results.  Testing/Procedures: Your physician has requested that you have cardiac CT. Cardiac computed tomography (CT) is a painless test that uses an x-ray machine to take clear, detailed pictures of your heart. For further information please visit [email or web address]. Please follow instruction sheet as given.  Follow-Up: At [facility], you and your health needs are our priority.  As part of our continuing mission to provide you with exceptional heart care, we have created designated Provider Care Teams.  These Care Teams include your primary Cardiologist (physician) and Advanced Practice Providers (APPs -  Physician Assistants and Nurse Practitioners) who all work together to provide you with the care you need, when you need it.  Your next appointment:   3 month(s)  The format for your next appointment:   In Person  Provider:   Minus Breeding, MD  Thank you for choosing [facility]!!    Your cardiac CT will be scheduled at one of the below locations:   [facility] [address] [place], [place] [address] [phone]  Please arrive at the [facility] main entrance of [facility] 30-45 minutes prior to test start time. Proceed to the [facility] Radiology Department (first floor) to  check-in and test prep.  Please follow these instructions carefully (unless otherwise directed):  On the Night Before the Test: . Be sure to Drink plenty of water. . Do not consume any caffeinated/decaffeinated beverages or chocolate 12 hours prior to your test. . Do not take any antihistamines 12 hours prior to your test.  On the Day of the Test: . Drink plenty of water. Do not drink any water within one hour of the test. . Do not eat any food 4 hours prior to the test. . You may take your regular medications prior to the test.  . Take metoprolol (Lopressor) two hours prior to test. . HOLD Furosemide/Hydrochlorothiazide morning of the test. . FEMALES- please wear underwire-free bra if available   After the Test: . Drink plenty of water. . After receiving IV contrast, you may experience a mild flushed feeling. This is normal. . On occasion, you may experience a mild rash up to 24 hours after the test. This is not dangerous. If this occurs, you can take Benadryl 25 mg and increase your fluid intake. . If you experience trouble breathing, this can be serious. If it is severe call 911 IMMEDIATELY. If it is mild, please call our office. . If you take any of these medications: Glipizide/Metformin, Avandament, Glucavance, please do not take 48 hours after completing test unless otherwise instructed.   Once we have confirmed authorization from your insurance company, we will call you to set up a date and time for your test.   For non-scheduling related questions, please contact  the cardiac imaging nurse navigator should you have any questions/concerns: Marchia Bond, RN Navigator Cardiac Imaging [facility] [phone] Office

## 2019-08-11 ENCOUNTER — Other Ambulatory Visit: Payer: Self-pay | Admitting: Cardiology

## 2019-08-17 ENCOUNTER — Other Ambulatory Visit: Payer: Self-pay | Admitting: Family Medicine

## 2019-08-18 ENCOUNTER — Other Ambulatory Visit: Payer: Self-pay

## 2019-08-18 MED ORDER — PROLIA 60 MG/ML ~~LOC~~ SOSY: PREFILLED_SYRINGE | SUBCUTANEOUS | 0 refills | Status: DC

## 2019-08-26 ENCOUNTER — Other Ambulatory Visit: Payer: Self-pay | Admitting: Cardiology

## 2019-08-29 DIAGNOSIS — H353124 Nonexudative age-related macular degeneration, left eye, advanced atrophic with subfoveal involvement: Secondary | ICD-10-CM | POA: Diagnosis not present

## 2019-08-29 DIAGNOSIS — H43813 Vitreous degeneration, bilateral: Secondary | ICD-10-CM | POA: Diagnosis not present

## 2019-08-29 DIAGNOSIS — H35373 Puckering of macula, bilateral: Secondary | ICD-10-CM | POA: Diagnosis not present

## 2019-08-29 DIAGNOSIS — H353211 Exudative age-related macular degeneration, right eye, with active choroidal neovascularization: Secondary | ICD-10-CM | POA: Diagnosis not present

## 2019-09-02 NOTE — Telephone Encounter (Signed)
I dont see diagnosis for vision impairment

## 2019-09-03 ENCOUNTER — Other Ambulatory Visit: Payer: Self-pay | Admitting: Family Medicine

## 2019-09-05 ENCOUNTER — Telehealth: Payer: Self-pay | Admitting: *Deleted

## 2019-09-05 MED ORDER — ESOMEPRAZOLE MAGNESIUM 40 MG PO CPDR: 40.0000 mg | DELAYED_RELEASE_CAPSULE | Freq: Every day | ORAL | 0 refills | Status: DC

## 2019-09-05 MED ORDER — SIMVASTATIN 10 MG PO TABS: 10.0000 mg | ORAL_TABLET | Freq: Every day | ORAL | 0 refills | Status: DC

## 2019-09-05 MED ORDER — MONTELUKAST SODIUM 10 MG PO TABS: ORAL_TABLET | ORAL | 0 refills | Status: DC

## 2019-09-05 NOTE — Telephone Encounter (Signed)
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

## 2019-09-05 NOTE — Telephone Encounter (Signed)
Ordered from pharmacy and arriving on 09/08/2019 per specialty pharmacy

## 2019-09-05 NOTE — Telephone Encounter (Signed)
TC with Gwyndolyn Saxon Occupation Therapist Wanting to get [facility]'s [facility], so that patient can get her meds with a script talk reader, will send most recent refills to [facility] Several meds are done by Dr. Percival Spanish

## 2019-09-05 NOTE — Telephone Encounter (Signed)
Already been addressed   Mandy- advise polia

## 2019-09-09 ENCOUNTER — Ambulatory Visit (INDEPENDENT_AMBULATORY_CARE_PROVIDER_SITE_OTHER): Payer: Medicare HMO | Admitting: *Deleted

## 2019-09-09 ENCOUNTER — Other Ambulatory Visit: Payer: Self-pay

## 2019-09-09 DIAGNOSIS — Z01812 Encounter for preprocedural laboratory examination: Secondary | ICD-10-CM | POA: Diagnosis not present

## 2019-09-09 DIAGNOSIS — M810 Age-related osteoporosis without current pathological fracture: Secondary | ICD-10-CM

## 2019-09-09 DIAGNOSIS — R0602 Shortness of breath: Secondary | ICD-10-CM | POA: Diagnosis not present

## 2019-09-09 LAB — BASIC METABOLIC PANEL
BUN/Creatinine Ratio: 19 (ref 12–28)
BUN: 16 mg/dL (ref 8–27)
CO2: 28 mmol/L (ref 20–29)
Calcium: 9.6 mg/dL (ref 8.7–10.3)
Chloride: 103 mmol/L (ref 96–106)
Creatinine, Ser: 0.85 mg/dL (ref 0.57–1.00)
GFR calc Af Amer: 75 mL/min/{1.73_m2} (ref >59)
GFR calc non Af Amer: 65 mL/min/{1.73_m2} (ref >59)
Glucose: 98 mg/dL (ref 65–99)
Potassium: 4.2 mmol/L (ref 3.5–5.2)
Sodium: 144 mmol/L (ref 134–144)

## 2019-09-09 MED ORDER — DENOSUMAB 60 MG/ML ~~LOC~~ SOSY
60.0000 mg | PREFILLED_SYRINGE | Freq: Once | SUBCUTANEOUS | Status: AC
  Administered 2019-09-09: 10:00:00 60 mg via SUBCUTANEOUS

## 2019-09-09 NOTE — Addendum Note (Signed)
Addended by: Earlene Plater on: 09/09/2019 10:09 AM   Modules accepted: Orders

## 2019-09-09 NOTE — Progress Notes (Signed)
Prolia injection given and tolerated well Patient supplied

## 2019-09-21 ENCOUNTER — Encounter (HOSPITAL_COMMUNITY): Payer: Self-pay

## 2019-09-22 ENCOUNTER — Telehealth (HOSPITAL_COMMUNITY): Payer: Self-pay | Admitting: Emergency Medicine

## 2019-09-22 NOTE — Telephone Encounter (Signed)
Left message on voicemail with name and callback number Vikrant Pryce RN Navigator Cardiac Imaging [facility] [phone] Office [phone] Cell  

## 2019-09-23 ENCOUNTER — Ambulatory Visit (HOSPITAL_COMMUNITY)
Admission: RE | Admit: 2019-09-23 | Discharge: 2019-09-23 | Disposition: A | Discharge status: Home / Self Care | Payer: Medicare HMO | Source: Ambulatory Visit | Attending: Cardiology | Admitting: Cardiology

## 2019-09-23 ENCOUNTER — Other Ambulatory Visit: Payer: Self-pay

## 2019-09-23 DIAGNOSIS — R0602 Shortness of breath: Secondary | ICD-10-CM | POA: Diagnosis present

## 2019-09-23 DIAGNOSIS — I2510 Atherosclerotic heart disease of native coronary artery without angina pectoris: Secondary | ICD-10-CM | POA: Insufficient documentation

## 2019-09-23 DIAGNOSIS — Q211 Atrial septal defect: Secondary | ICD-10-CM | POA: Diagnosis not present

## 2019-09-23 MED ORDER — NITROGLYCERIN 0.4 MG SL SUBL: 0.8000 mg | SUBLINGUAL_TABLET | Freq: Once | SUBLINGUAL | Status: DC

## 2019-09-23 MED ORDER — IOHEXOL 350 MG/ML SOLN
80.0000 mL | Freq: Once | INTRAVENOUS | Status: AC | PRN
  Administered 2019-09-23: 80 mL via INTRAVENOUS

## 2019-09-23 MED ORDER — NITROGLYCERIN 0.4 MG SL SUBL
SUBLINGUAL_TABLET | SUBLINGUAL | Status: AC
  Filled 2019-09-23: qty 2

## 2019-09-28 ENCOUNTER — Other Ambulatory Visit: Payer: Self-pay | Admitting: Family Medicine

## 2019-09-28 DIAGNOSIS — I2510 Atherosclerotic heart disease of native coronary artery without angina pectoris: Secondary | ICD-10-CM | POA: Diagnosis not present

## 2019-09-28 MED ORDER — ESOMEPRAZOLE MAGNESIUM 40 MG PO CPDR: 40.0000 mg | DELAYED_RELEASE_CAPSULE | Freq: Every day | ORAL | 0 refills | Status: DC

## 2019-09-28 MED ORDER — SIMVASTATIN 10 MG PO TABS: 10.0000 mg | ORAL_TABLET | Freq: Every day | ORAL | 0 refills | Status: DC

## 2019-09-28 MED ORDER — DILTIAZEM HCL ER COATED BEADS 300 MG PO CP24: ORAL_CAPSULE | ORAL | 1 refills | Status: DC

## 2019-09-28 MED ORDER — MONTELUKAST SODIUM 10 MG PO TABS: ORAL_TABLET | ORAL | 0 refills | Status: DC

## 2019-09-28 MED ORDER — FERROUS SULFATE 325 (65 FE) MG PO TABS: 325.0000 mg | ORAL_TABLET | Freq: Every day | ORAL | 1 refills | Status: DC

## 2019-09-28 MED ORDER — METOPROLOL TARTRATE 25 MG PO TABS: ORAL_TABLET | ORAL | 2 refills | Status: DC

## 2019-09-28 MED ORDER — METOPROLOL TARTRATE 50 MG PO TABS: 50.0000 mg | ORAL_TABLET | Freq: Once | ORAL | 0 refills | Status: DC

## 2019-09-30 ENCOUNTER — Telehealth: Payer: Self-pay | Admitting: Cardiology

## 2019-09-30 NOTE — Telephone Encounter (Signed)
Shellia Cleverly, RN  09/30/2019 4:42 PM EST    Reviewed results of testing. Pt states understanding. She has an appt scheduled 4/7 in the [place] office. She will keep that however I did put her on the waitlist incase of a cancellation.  She is not presently having any s/s. IF she does she "lays down" to get relief. Encouraged pt to use SL Ntg if rest does not resolve discomfort. She will c/b if she has any concerns prior to that appt.

## 2019-09-30 NOTE — Telephone Encounter (Signed)
   Pt is returning call regarding her Ct results.  Please call back

## 2019-10-03 DIAGNOSIS — H353211 Exudative age-related macular degeneration, right eye, with active choroidal neovascularization: Secondary | ICD-10-CM | POA: Diagnosis not present

## 2019-10-31 DIAGNOSIS — H353211 Exudative age-related macular degeneration, right eye, with active choroidal neovascularization: Secondary | ICD-10-CM | POA: Diagnosis not present

## 2019-10-31 DIAGNOSIS — H353124 Nonexudative age-related macular degeneration, left eye, advanced atrophic with subfoveal involvement: Secondary | ICD-10-CM | POA: Diagnosis not present

## 2019-11-01 ENCOUNTER — Telehealth: Payer: Self-pay | Admitting: *Deleted

## 2019-11-01 ENCOUNTER — Other Ambulatory Visit: Payer: Self-pay

## 2019-11-01 ENCOUNTER — Ambulatory Visit (INDEPENDENT_AMBULATORY_CARE_PROVIDER_SITE_OTHER): Payer: Medicare HMO | Admitting: Family Medicine

## 2019-11-01 ENCOUNTER — Encounter: Payer: Self-pay | Admitting: Family Medicine

## 2019-11-01 VITALS — BP 117/67 | HR 81 | Temp 98.0°F | Ht 63.0 in | Wt 169.5 lb

## 2019-11-01 DIAGNOSIS — K219 Gastro-esophageal reflux disease without esophagitis: Secondary | ICD-10-CM

## 2019-11-01 DIAGNOSIS — E782 Mixed hyperlipidemia: Secondary | ICD-10-CM

## 2019-11-01 DIAGNOSIS — F411 Generalized anxiety disorder: Secondary | ICD-10-CM

## 2019-11-01 DIAGNOSIS — J4520 Mild intermittent asthma, uncomplicated: Secondary | ICD-10-CM

## 2019-11-01 DIAGNOSIS — Z79891 Long term (current) use of opiate analgesic: Secondary | ICD-10-CM | POA: Diagnosis not present

## 2019-11-01 DIAGNOSIS — R69 Illness, unspecified: Secondary | ICD-10-CM | POA: Diagnosis not present

## 2019-11-01 MED ORDER — DIAZEPAM 5 MG PO TABS: 5.0000 mg | ORAL_TABLET | Freq: Every evening | ORAL | 2 refills | Status: DC | PRN

## 2019-11-01 NOTE — Telephone Encounter (Signed)
Prior Auth for DIAZEPAM 5MG  TABLETS- IN PROCESS   Key: [id number] PA Case ID: [id number]  Your information has been submitted to [place] [employer] Part D. [employer] will review the request and will issue a decision, typically within 1-3 days from your submission. You can check the updated outcome later by reopening this request.  If [employer] has not responded in 1-3 days or if you have any questions about your ePA request, please contact [place] Medicare Part D at [phone]. If you think there may be a problem with your PA request, use our live chat feature at the bottom right.

## 2019-11-01 NOTE — Progress Notes (Signed)
BP 117/67   Pulse 81   Temp 98 F (36.7 C)   Ht '5\' 3"'  (1.6 m)   Wt 169 lb 8 oz (76.9 kg)   SpO2 96%   BMI 30.03 kg/m    Subjective:   Patient ID: Krystal [name], female    DOB: [birth year], 79 y.o.   MRN: [id number]  HPI: Krystal [name] is a 79 y.o. female presenting on 11/01/2019 for Medical Management of Chronic Issues and Medication Refill (Valium)   HPI Anxiety Current rx-Valium 5 mg nightly as needed, patient does not take daily and maybe uses 30 pills in 3 months # meds rx-30 Effectiveness of current meds-works well Adverse reactions form meds-none  Pill count performed-No Last drug screen -N/A ( high risk q65m moderate risk q647mlow risk yearly ) Urine drug screen today- Yes [place]  If yes were their any concerning findings? -None  No flowsheet data found.   Controlled substance contract signed on: Today  GERD Patient is currently on Nexium.  She denies any major symptoms or abdominal pain or belching or burping. She denies any blood in her stool or lightheadedness or dizziness.   Hyperlipidemia Patient is coming in for recheck of his hyperlipidemia. The patient is currently taking simvastatin. They deny any issues with myalgias or history of liver damage from it. They deny any focal numbness or weakness or chest pain.   Relevant past medical, surgical, family and social history reviewed and updated as indicated. Interim medical history since our last visit reviewed. Allergies and medications reviewed and updated.  Review of Systems  Constitutional: Negative for chills and fever.  Eyes: Negative for visual disturbance.  Respiratory: Positive for cough and wheezing. Negative for chest tightness and shortness of breath.   Cardiovascular: Negative for chest pain and leg swelling.  Musculoskeletal: Negative for back pain and gait problem.  Skin: Negative for rash.  Neurological: Negative for light-headedness and headaches.   Psychiatric/Behavioral: Positive for dysphoric mood. Negative for agitation, behavioral problems, decreased concentration, self-injury, sleep disturbance and suicidal ideas. The patient is nervous/anxious.   All other systems reviewed and are negative.   Per HPI unless specifically indicated above   Allergies as of 11/01/2019      Reactions   Actonel [risedronate] Nausea And Vomiting, Other (See Comments)   Throat tightness   Fosamax [alendronate Sodium] Other (See Comments)   esophagitis   Mevacor [lovastatin] Other (See Comments)   myalgias   Miacalcin [calcitonin (salmon)] Nausea And Vomiting   Mobic [meloxicam] Hives, Itching   rash      Medication List       Accurate as of November 01, 2019  2:42 PM. If you have any questions, ask your nurse or doctor.        albuterol 108 (90 Base) MCG/ACT inhaler Commonly known as: ProAir HFA INHALE TWO PUFFS BY MOUTH 4 TIMES DAILY AS NEEDED   aspirin EC 81 MG tablet Take 81 mg by mouth at bedtime.   benzonatate 100 MG capsule Commonly known as: Tessalon Perles Take 1 capsule (100 mg total) by mouth 3 (three) times daily as needed for cough.   Calcium Citrate-Vitamin D 315-250 MG-UNIT Tabs Commonly known as: Calcium Citrate + D Take 2 tablets by mouth daily.   cetirizine 10 MG tablet Commonly known as: ZYRTEC Take 5-10 mg by mouth at bedtime.   diazepam 5 MG tablet Commonly known as: VALIUM Take 1 tablet (5 mg total) by mouth at bedtime as  needed. What changed: when to take this Changed by: Fransisca Kaufmann Lennix Kneisel, MD   diltiazem 300 MG 24 hr capsule Commonly known as: CARDIZEM CD TAKE 1 CAPSULE BY MOUTH EVERY DAY   esomeprazole 40 MG capsule Commonly known as: NEXIUM Take 1 capsule (40 mg total) by mouth daily. TAKE 1 CAPSULE BY MOUTH EVERY DAY   ferrous sulfate 325 (65 FE) MG tablet Take 1 tablet (325 mg total) by mouth at bedtime.   ipratropium-albuterol 0.5-2.5 (3) MG/3ML Soln Commonly known as: DUONEB Take 3 mLs  by nebulization every 6 (six) hours as needed. Dx:  Asthma 493.90   metoprolol tartrate 25 MG tablet Commonly known as: LOPRESSOR TAKE 1 TABLET (25 MG TOTAL) BY MOUTH DAILY AS NEEDED.   metoprolol tartrate 50 MG tablet Commonly known as: Lopressor Take 1 tablet (50 mg total) by mouth once for 1 dose. Take 1 tablet 2 hours before your CT scan   montelukast 10 MG tablet Commonly known as: SINGULAIR TAKE 1 TABLET BY MOUTH EVERYDAY AT BEDTIME   nitroGLYCERIN 0.4 MG/SPRAY spray Commonly known as: NITROLINGUAL ONE SPRAY UNDER TONGUE AS NEED FOR CHESTPAIN. MAY REPEAT IN 5 MINUTES IF PAIN NOT RELIEVED CALL 911.   PRESERVISION/LUTEIN PO Take 1 capsule by mouth at bedtime.   Prolia 60 MG/ML Sosy injection Generic drug: denosumab TO BE ADMINISTERED IN PHYSICIAN'S OFFICE. INJECT ONE SYRINGE SUBCOUSLY ONCE EVERY 6 MONTHS. REFRIGERATE. USE WITHIN 14 DAYS ONCE AT ROOM TEMPERATURE.   Restasis 0.05 % ophthalmic emulsion Generic drug: cycloSPORINE Place 1 drop into both eyes 2 (two) times daily.   simvastatin 10 MG tablet Commonly known as: ZOCOR Take 1 tablet (10 mg total) by mouth daily.   Vitamin D 50 MCG (2000 UT) Caps Take 1 capsule by mouth at bedtime.            Durable Medical Equipment  (From admission, onward)         Start     Ordered   11/01/19 0000  For home use only DME Nebulizer machine    Comments: Diagnosis asthma intermittent, please give patient a nebulizer with a Development worker, community plug-in for when her power goes out.  Question Answer Comment  Patient needs a nebulizer to treat with the following condition Asthma   Length of Need Lifetime      11/01/19 1442           Objective:   BP 117/67   Pulse 81   Temp 98 F (36.7 C)   Ht '5\' 3"'  (1.6 m)   Wt 169 lb 8 oz (76.9 kg)   SpO2 96%   BMI 30.03 kg/m   Wt Readings from Last 3 Encounters:  11/01/19 169 lb 8 oz (76.9 kg)  08/10/19 170 lb (77.1 kg)  05/18/19 170 lb (77.1 kg)    Physical Exam Vitals and  nursing note reviewed.  Constitutional:      General: She is not in acute distress.    Appearance: She is well-developed. She is not diaphoretic.  Eyes:     Conjunctiva/sclera: Conjunctivae normal.     Pupils: Pupils are equal, round, and reactive to light.  Cardiovascular:     Rate and Rhythm: Normal rate and regular rhythm.     Heart sounds: Normal heart sounds. No murmur.  Pulmonary:     Effort: Pulmonary effort is normal. No respiratory distress.     Breath sounds: Normal breath sounds. No wheezing.  Musculoskeletal:        General: No tenderness.  Normal range of motion.  Skin:    General: Skin is warm and dry.     Findings: No rash.  Neurological:     Mental Status: She is alert and oriented to person, place, and time.     Coordination: Coordination normal.  Psychiatric:        Behavior: Behavior normal.     Results for orders placed or performed in visit on 89/16/94  Basic Metabolic Panel (BMET)  Result Value Ref Range   Glucose 98 65 - 99 mg/dL   BUN 16 8 - 27 mg/dL   Creatinine, Ser 0.85 0.57 - 1.00 mg/dL   GFR calc non Af Amer 65 >59 mL/min/1.73   GFR calc Af Amer 75 >59 mL/min/1.73   BUN/Creatinine Ratio 19 12 - 28   Sodium 144 134 - 144 mmol/L   Potassium 4.2 3.5 - 5.2 mmol/L   Chloride 103 96 - 106 mmol/L   CO2 28 20 - 29 mmol/L   Calcium 9.6 8.7 - 10.3 mg/dL    Assessment & Plan:   Problem List Items Addressed This Visit      Respiratory   Asthma   Relevant Orders   For home use only DME Nebulizer machine     Digestive   GERD   Relevant Orders   CBC with Differential/Platelet   CMP14+EGFR     Other   Mixed hyperlipidemia - Primary   Relevant Orders   Lipid panel   Anxiety state   Relevant Medications   diazepam (VALIUM) 5 MG tablet   Other Relevant Orders   CBC with Differential/Platelet   ToxASSURE Select 13 (MW), Urine      Patient's asthma sometimes gets bad and that is been a lot of power outages recently so she wants to get a  nebulizer machine for her car just in case the power goes out.   Follow up plan: Return in about 6 months (around 05/03/2020), or if symptoms worsen or fail to improve, for Anxiety and hyperlipidemia.  Counseling provided for all of the vaccine components Orders Placed This Encounter  Procedures  . For home use only DME Nebulizer machine  . CBC with Differential/Platelet  . CMP14+EGFR  . Lipid panel  . ToxASSURE Select 13 (MW), Urine    Caryl Pina, MD [facility] 11/01/2019, 2:42 PM

## 2019-11-02 LAB — CMP14+EGFR
ALT: 33 IU/L — ABNORMAL HIGH (ref 0–32)
AST: 34 IU/L (ref 0–40)
Albumin/Globulin Ratio: 2.2 (ref 1.2–2.2)
Albumin: 4.6 g/dL (ref 3.7–4.7)
Alkaline Phosphatase: 63 IU/L (ref 39–117)
BUN/Creatinine Ratio: 33 — ABNORMAL HIGH (ref 12–28)
BUN: 29 mg/dL — ABNORMAL HIGH (ref 8–27)
Bilirubin Total: 0.3 mg/dL (ref 0.0–1.2)
CO2: 27 mmol/L (ref 20–29)
Calcium: 9.9 mg/dL (ref 8.7–10.3)
Chloride: 101 mmol/L (ref 96–106)
Creatinine, Ser: 0.87 mg/dL (ref 0.57–1.00)
GFR calc Af Amer: 73 mL/min/{1.73_m2} (ref >59)
GFR calc non Af Amer: 63 mL/min/{1.73_m2} (ref >59)
Globulin, Total: 2.1 g/dL (ref 1.5–4.5)
Glucose: 98 mg/dL (ref 65–99)
Potassium: 4.8 mmol/L (ref 3.5–5.2)
Sodium: 144 mmol/L (ref 134–144)
Total Protein: 6.7 g/dL (ref 6.0–8.5)

## 2019-11-02 LAB — CBC WITH DIFFERENTIAL/PLATELET
Basophils Absolute: 0 10*3/uL (ref 0.0–0.2)
Basos: 1 %
EOS (ABSOLUTE): 0.2 10*3/uL (ref 0.0–0.4)
Eos: 3 %
Hematocrit: 43.4 % (ref 34.0–46.6)
Hemoglobin: 14.9 g/dL (ref 11.1–15.9)
Immature Grans (Abs): 0 10*3/uL (ref 0.0–0.1)
Immature Granulocytes: 0 %
Lymphocytes Absolute: 1.8 10*3/uL (ref 0.7–3.1)
Lymphs: 21 %
MCH: 31.3 pg (ref 26.6–33.0)
MCHC: 34.3 g/dL (ref 31.5–35.7)
MCV: 91 fL (ref 79–97)
Monocytes Absolute: 0.6 10*3/uL (ref 0.1–0.9)
Monocytes: 7 %
Neutrophils Absolute: 5.6 10*3/uL (ref 1.4–7.0)
Neutrophils: 68 %
Platelets: 182 10*3/uL (ref 150–450)
RBC: 4.76 x10E6/uL (ref 3.77–5.28)
RDW: 12.3 % (ref 11.7–15.4)
WBC: 8.2 10*3/uL (ref 3.4–10.8)

## 2019-11-02 LAB — LIPID PANEL
Chol/HDL Ratio: 2.8 ratio (ref 0.0–4.4)
Cholesterol, Total: 175 mg/dL (ref 100–199)
HDL: 63 mg/dL (ref >39)
LDL Chol Calc (NIH): 92 mg/dL (ref 0–99)
Triglycerides: 113 mg/dL (ref 0–149)
VLDL Cholesterol Cal: 20 mg/dL (ref 5–40)

## 2019-11-02 NOTE — Telephone Encounter (Signed)
Prior Auth for Diazepam 5mg  tab-APPROVED till 08/03/20  Pharmacy notified.

## 2019-11-03 ENCOUNTER — Telehealth: Payer: Self-pay

## 2019-11-03 NOTE — Telephone Encounter (Signed)
Diazepam approved through [place] from 08/05/2019 through 08/03/2020. [facility] [place] [facility] aware.

## 2019-11-04 LAB — TOXASSURE SELECT 13 (MW), URINE

## 2019-11-08 NOTE — Progress Notes (Signed)
Cardiology Office Note   Date:  11/09/2019   ID:  Krystal [name], DOB 04-25-1941, MRN [id number]  PCP:  Dettinger, Fransisca Kaufmann, MD  Cardiologist:   No primary care provider on file.   Chief Complaint  Patient presents with   Coronary Artery Disease      History of Present Illness: Krystal [name] is a 79 y.o. female who presents for follow up of SVT.   I saw her in July and she had some dyspnea.  She had a negative Lexiscan Myoview.    She had normal LV function on echo recently.  ProBNP was normal.  She continued to have pain and had a coronary CTA recently.  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  It was decided to manage her medically.   She did have a large hiatal hernia.  She called recently because of ongoing dyspnea and chest pain.  This happened about 10 days ago.  She had severe pain in her mid chest.  It was time.  It did go away after sublingual nitroglycerin spray but it lasted total about 30 minutes.  It was severe.  There was no radiation to her jaw or to her arms.  It happened at night and woke her up.  The first time that this woke her from her sleep.  She is able to do some activities and she gets short of breath with this but she is not describing necessarily reproducible chest discomfort.  Her pain seems to come on sporadically.  Past Medical History:  Diagnosis Date   Anemia    Asthma    Brain tumor ([place])    1986   Breast cancer ([place])    Right breast   Cataract    Colon polyps    COPD (chronic obstructive pulmonary disease) (HCC)    Hiatal hernia    Hyperlipidemia    Leg edema    Macular degeneration    Osteoporosis    Pneumonia    PVD (posterior vitreous detachment)    Right knee pain    Shingles rash     Past Surgical History:  Procedure Laterality Date   CATARACT EXTRACTION, BILATERAL     COLONOSCOPY     CRANIECTOMY / CRANIOTOMY FOR EXCISION OF BRAIN TUMOR     Left side brain   MASTECTOMY Right      PARTIAL HYSTERECTOMY     POLYPECTOMY     SKIN CANCER EXCISION Left    Under left eye     Current Outpatient Medications  Medication Sig Dispense Refill   albuterol (PROAIR HFA) 108 (90 Base) MCG/ACT inhaler INHALE TWO PUFFS BY MOUTH 4 TIMES DAILY AS NEEDED 9 g 2   aspirin EC 81 MG tablet Take 81 mg by mouth at bedtime.     benzonatate (TESSALON PERLES) 100 MG capsule Take 1 capsule (100 mg total) by mouth 3 (three) times daily as needed for cough. 20 capsule 0   Calcium Citrate-Vitamin D (CALCIUM CITRATE + D) 315-250 MG-UNIT TABS Take 2 tablets by mouth daily. 120 tablet    cetirizine (ZYRTEC) 10 MG tablet Take 5-10 mg by mouth at bedtime.      Cholecalciferol (VITAMIN D) 2000 UNITS CAPS Take 1 capsule by mouth at bedtime.     denosumab (PROLIA) 60 MG/ML SOSY injection TO BE ADMINISTERED IN PHYSICIAN'S OFFICE. INJECT ONE SYRINGE SUBCOUSLY ONCE EVERY 6 MONTHS. REFRIGERATE. USE WITHIN 14 DAYS ONCE AT ROOM TEMPERATURE. 1 mL 0  diazepam (VALIUM) 5 MG tablet Take 1 tablet (5 mg total) by mouth at bedtime as needed. 30 tablet 2   diltiazem (CARDIZEM CD) 300 MG 24 hr capsule TAKE 1 CAPSULE BY MOUTH EVERY DAY 90 capsule 1   esomeprazole (NEXIUM) 40 MG capsule Take 1 capsule (40 mg total) by mouth daily. TAKE 1 CAPSULE BY MOUTH EVERY DAY 90 capsule 0   ferrous sulfate 325 (65 FE) MG tablet Take 1 tablet (325 mg total) by mouth at bedtime. 90 tablet 1   ipratropium-albuterol (DUONEB) 0.5-2.5 (3) MG/3ML SOLN Take 3 mLs by nebulization every 6 (six) hours as needed. Dx:  Asthma 493.90 360 mL 2   metoprolol tartrate (LOPRESSOR) 25 MG tablet TAKE 1 TABLET (25 MG TOTAL) BY MOUTH DAILY AS NEEDED. 90 tablet 2   montelukast (SINGULAIR) 10 MG tablet TAKE 1 TABLET BY MOUTH EVERYDAY AT BEDTIME 90 tablet 0   Multiple Vitamins-Minerals (PRESERVISION/LUTEIN PO) Take 1 capsule by mouth at bedtime.      nitroGLYCERIN (NITROLINGUAL) 0.4 MG/SPRAY spray ONE SPRAY UNDER TONGUE AS NEED FOR CHESTPAIN.  MAY REPEAT IN 5 MINUTES IF PAIN NOT RELIEVED CALL 911. 12 g 3   RESTASIS 0.05 % ophthalmic emulsion Place 1 drop into both eyes 2 (two) times daily.  4   simvastatin (ZOCOR) 10 MG tablet Take 1 tablet (10 mg total) by mouth daily. 90 tablet 0   No current facility-administered medications for this visit.    Allergies:   Actonel [risedronate], Fosamax [alendronate sodium], Mevacor [lovastatin], Miacalcin [calcitonin (salmon)], and Mobic [meloxicam]    ROS:  Please see the history of present illness.   Otherwise, review of systems are positive for none.   All other systems are reviewed and negative.    PHYSICAL EXAM: VS:  BP 120/70    Pulse 88    Temp 99.2 F (37.3 C)    Ht 5\' 3"  (1.6 m)    Wt 171 lb (77.6 kg)    BMI 30.29 kg/m  , BMI Body mass index is 30.29 kg/m. GENERAL:  Well appearing NECK:  No jugular venous distention, waveform within normal limits, carotid upstroke brisk and symmetric, no bruits, no thyromegaly LUNGS:  Clear to auscultation bilaterally CHEST:  Unremarkable HEART:  PMI not displaced or sustained,S1 and S2 within normal limits, no S3, no S4, no clicks, no rubs, no murmurs ABD:  Flat, positive bowel sounds normal in frequency in pitch, no bruits, no rebound, no guarding, no midline pulsatile mass, no hepatomegaly, no splenomegaly EXT:  2 plus pulses throughout, no edema, no cyanosis no clubbing   EKG:  EKG is  ordered today. Sinus rhythm, rate 83, axis within normal limits, intervals within normal limits, no acute ST-T wave changes.  Recent Labs: 05/18/2019: NT-Pro BNP 154 11/01/2019: ALT 33; BUN 29; Creatinine, Ser 0.87; Hemoglobin 14.9; Platelets 182; Potassium 4.8; Sodium 144    Lipid Panel    Component Value Date/Time   CHOL 175 11/01/2019 1504   CHOL 152 03/04/2013 1212   TRIG 113 11/01/2019 1504   TRIG 93 04/08/2016 1158   TRIG 97 03/04/2013 1212   HDL 63 11/01/2019 1504   HDL 68 04/08/2016 1158   HDL 62 03/04/2013 1212   CHOLHDL 2.8 11/01/2019  1504   LDLCALC 92 11/01/2019 1504   LDLCALC 75 01/27/2014 1147   LDLCALC 71 03/04/2013 1212      Wt Readings from Last 3 Encounters:  11/09/19 171 lb (77.6 kg)  11/01/19 169 lb 8 oz (76.9 kg)  08/10/19 170 lb (77.1 kg)      Other studies Reviewed: Additional studies/ records that were reviewed today include:  CTA Review of the above records demonstrates:  See above.   ASSESSMENT AND PLAN:  SVT:     She had no symptoms related to this.  No change in therapy.  CHEST PAIN:   She has nonobstructive and distal vessel CAD.  She did have a large hiatal hernia.   She did have obstructive distal vessel LAD disease and mild disease elsewhere.  Given the increase in her symptoms symptomatically now having rest symptoms cardiac cath is indicated.  If there is no high-grade obstructive disease requires angioplasty this likely would be a GI source and she might need to be referred particularly given the hiatal hernia.  I would like her to also get a right heart catheterization.  This is to evaluate the shortness of breath.  The patient understands that risks included but are not limited to stroke (1 in 1000), death (1 in 32), kidney failure [usually temporary] (1 in 500), bleeding (1 in 200), allergic reaction [possibly serious] (1 in 200).  The patient understands and agrees to proceed.   DYSPNEA:    This will be evaluated as above.  She does have a large hiatal hernia which could be contributing.   COVID EDCUATION:   She did get her Covid vaccine.   Current medicines are reviewed at length with the patient today.  The patient does not have concerns regarding medicines.  The following changes have been made:   None  Labs/ tests ordered today include:    Orders Placed This Encounter  Procedures   Basic metabolic panel   CBC   EKG 12-Lead     Disposition:   FU with me after the cath.   Signed, Minus Breeding, MD  11/09/2019 1:59 PM    [facility]

## 2019-11-09 ENCOUNTER — Other Ambulatory Visit: Payer: Self-pay

## 2019-11-09 ENCOUNTER — Encounter: Payer: Self-pay | Admitting: Cardiology

## 2019-11-09 ENCOUNTER — Ambulatory Visit (INDEPENDENT_AMBULATORY_CARE_PROVIDER_SITE_OTHER): Payer: Medicare HMO | Admitting: Cardiology

## 2019-11-09 VITALS — BP 120/70 | HR 88 | Temp 99.2°F | Ht 63.0 in | Wt 171.0 lb

## 2019-11-09 DIAGNOSIS — Z01812 Encounter for preprocedural laboratory examination: Secondary | ICD-10-CM

## 2019-11-09 DIAGNOSIS — R0602 Shortness of breath: Secondary | ICD-10-CM | POA: Diagnosis not present

## 2019-11-09 DIAGNOSIS — R072 Precordial pain: Secondary | ICD-10-CM | POA: Diagnosis not present

## 2019-11-09 NOTE — Patient Instructions (Addendum)
Medication Instructions:  The current medical regimen is effective;  continue present plan and medications.  *If you need a refill on your cardiac medications before your next appointment, please call your pharmacy*  Lab Work: Please have blood work at [facility])  If you have labs (blood work) drawn today and your tests are completely normal, you will receive your results only by: Marland Kitchen MyChart Message (if you have MyChart) OR . A paper copy in the mail If you have any lab test that is abnormal or we need to change your treatment, we will call you to review the results.  Testing/Procedures: Your physician has requested that you have a cardiac catheterization. Cardiac catheterization is used to diagnose and/or treat various heart conditions. Doctors may recommend this procedure for a number of different reasons. The most common reason is to evaluate chest pain. Chest pain can be a symptom of coronary artery disease (CAD), and cardiac catheterization can show whether plaque is narrowing or blocking your heart's arteries. This procedure is also used to evaluate the valves, as well as measure the blood flow and oxygen levels in different parts of your heart. For further information please visit [email or web address]. Please follow instruction sheet, as given.  Follow-Up: At [facility], you and your health needs are our priority.  As part of our continuing mission to provide you with exceptional heart care, we have created designated Provider Care Teams.  These Care Teams include your primary Cardiologist (physician) and Advanced Practice Providers (APPs -  Physician Assistants and Nurse Practitioners) who all work together to provide you with the care you need, when you need it.  We recommend signing up for the patient portal called "MyChart".  Sign up information is provided on this After Visit Summary.  MyChart is used to connect with patients for Virtual Visits (Telemedicine).  Patients are able  to view lab/test results, encounter notes, upcoming appointments, etc.  Non-urgent messages can be sent to your provider as well.   To learn more about what you can do with MyChart, go to [email or web address].    Your next appointment:   2 week(s) after your cardiac cath  The format for your next appointment:   In Person  Provider:   Minus Breeding, MD   Thank you for choosing [facility]!!       Krystal [name] [id number] Dept: [phone] Loc: [place]  11/09/2019  Please call to schedule lab, covid screening and cardiac cath when you are ready to have this completed.  [phone]  1. Please arrive at the [facility] (Main Entrance A) at [facility]: [address] [place], [place] [address] at   (two hours before your procedure to ensure your preparation). Free valet parking service is available.   Special note: Every effort is made to have your procedure done on time. Please understand that emergencies sometimes delay scheduled procedures.  2. Diet: Nothing to eat of drink after midnight except your medications     with sips of water.  3. Labs: Please have blood work at [facility] as scheduled.      Please have COVID screen at [facility] as scheduled.  4. Medication instructions in preparation for your procedure:  On the morning of your procedure, take your ASA and any morning medicines NOT listed above.  You may use sips of water.  5. Plan for one night  stay--bring personal belongings. 6. Bring a current list of your medications and current insurance cards. 7. You MUST have a responsible person to drive you home. 8. Someone MUST be with you the first 24 hours after you arrive home or your discharge will be delayed. 9. Please wear clothes that are easy to get on and off and wear slip-on shoes.  Thank you for allowing  [place] to care for you!   -- [facility]

## 2019-11-28 DIAGNOSIS — H43813 Vitreous degeneration, bilateral: Secondary | ICD-10-CM | POA: Diagnosis not present

## 2019-11-28 DIAGNOSIS — H353211 Exudative age-related macular degeneration, right eye, with active choroidal neovascularization: Secondary | ICD-10-CM | POA: Diagnosis not present

## 2019-11-28 DIAGNOSIS — H35373 Puckering of macula, bilateral: Secondary | ICD-10-CM | POA: Diagnosis not present

## 2019-11-28 DIAGNOSIS — H353124 Nonexudative age-related macular degeneration, left eye, advanced atrophic with subfoveal involvement: Secondary | ICD-10-CM | POA: Diagnosis not present

## 2019-12-20 ENCOUNTER — Other Ambulatory Visit: Payer: Self-pay | Admitting: Family Medicine

## 2019-12-29 ENCOUNTER — Other Ambulatory Visit: Payer: Self-pay | Admitting: Family Medicine

## 2020-01-06 DIAGNOSIS — R52 Pain, unspecified: Secondary | ICD-10-CM | POA: Diagnosis not present

## 2020-01-06 DIAGNOSIS — R05 Cough: Secondary | ICD-10-CM | POA: Diagnosis not present

## 2020-01-06 DIAGNOSIS — J029 Acute pharyngitis, unspecified: Secondary | ICD-10-CM | POA: Diagnosis not present

## 2020-01-06 DIAGNOSIS — R0981 Nasal congestion: Secondary | ICD-10-CM | POA: Diagnosis not present

## 2020-01-09 DIAGNOSIS — H353211 Exudative age-related macular degeneration, right eye, with active choroidal neovascularization: Secondary | ICD-10-CM | POA: Diagnosis not present

## 2020-01-09 DIAGNOSIS — H353124 Nonexudative age-related macular degeneration, left eye, advanced atrophic with subfoveal involvement: Secondary | ICD-10-CM | POA: Diagnosis not present

## 2020-01-09 DIAGNOSIS — H35372 Puckering of macula, left eye: Secondary | ICD-10-CM | POA: Diagnosis not present

## 2020-02-14 ENCOUNTER — Other Ambulatory Visit: Payer: Self-pay | Admitting: Family Medicine

## 2020-02-17 ENCOUNTER — Other Ambulatory Visit: Payer: Self-pay

## 2020-02-17 MED ORDER — PROLIA 60 MG/ML ~~LOC~~ SOSY: PREFILLED_SYRINGE | SUBCUTANEOUS | 0 refills | Status: DC

## 2020-02-20 DIAGNOSIS — H353211 Exudative age-related macular degeneration, right eye, with active choroidal neovascularization: Secondary | ICD-10-CM | POA: Diagnosis not present

## 2020-02-20 DIAGNOSIS — H353124 Nonexudative age-related macular degeneration, left eye, advanced atrophic with subfoveal involvement: Secondary | ICD-10-CM | POA: Diagnosis not present

## 2020-03-08 ENCOUNTER — Telehealth: Payer: Self-pay | Admitting: Family Medicine

## 2020-03-08 NOTE — Telephone Encounter (Signed)
Made patient aware that we have sent her insurance information to [employer] to get Prolia approved. Then once approved we will schedule an appointment for her to come into the office for the injection.

## 2020-03-12 ENCOUNTER — Telehealth: Payer: Self-pay | Admitting: Family Medicine

## 2020-03-12 DIAGNOSIS — H35373 Puckering of macula, bilateral: Secondary | ICD-10-CM | POA: Diagnosis not present

## 2020-03-12 DIAGNOSIS — H43813 Vitreous degeneration, bilateral: Secondary | ICD-10-CM | POA: Diagnosis not present

## 2020-03-12 DIAGNOSIS — H353124 Nonexudative age-related macular degeneration, left eye, advanced atrophic with subfoveal involvement: Secondary | ICD-10-CM | POA: Diagnosis not present

## 2020-03-12 DIAGNOSIS — H353211 Exudative age-related macular degeneration, right eye, with active choroidal neovascularization: Secondary | ICD-10-CM | POA: Diagnosis not present

## 2020-03-12 NOTE — Telephone Encounter (Signed)
Patient asking if her prolia has came in she states someone told her it was ordered

## 2020-03-13 NOTE — Telephone Encounter (Signed)
Informed patient that Krystal [name] is here. Offered to schedule an appointment but she needs to have her son bring her. She will call back when she knows what time he can bring her.

## 2020-03-14 ENCOUNTER — Ambulatory Visit (INDEPENDENT_AMBULATORY_CARE_PROVIDER_SITE_OTHER): Payer: Medicare HMO

## 2020-03-14 ENCOUNTER — Other Ambulatory Visit: Payer: Self-pay

## 2020-03-14 DIAGNOSIS — M810 Age-related osteoporosis without current pathological fracture: Secondary | ICD-10-CM

## 2020-03-14 MED ORDER — DENOSUMAB 60 MG/ML ~~LOC~~ SOSY
60.0000 mg | PREFILLED_SYRINGE | Freq: Once | SUBCUTANEOUS | Status: AC
  Administered 2020-03-14: 60 mg via SUBCUTANEOUS

## 2020-03-14 NOTE — Progress Notes (Signed)
Prolia injection given RLQ of abdomen. Pt tolerated well. Pt knows to return in Feb for next injection.

## 2020-04-16 DIAGNOSIS — H353211 Exudative age-related macular degeneration, right eye, with active choroidal neovascularization: Secondary | ICD-10-CM | POA: Diagnosis not present

## 2020-05-03 ENCOUNTER — Ambulatory Visit (INDEPENDENT_AMBULATORY_CARE_PROVIDER_SITE_OTHER): Payer: Medicare HMO | Admitting: Family Medicine

## 2020-05-03 ENCOUNTER — Other Ambulatory Visit: Payer: Self-pay

## 2020-05-03 ENCOUNTER — Encounter: Payer: Self-pay | Admitting: Family Medicine

## 2020-05-03 VITALS — Ht 63.0 in | Wt 170.0 lb

## 2020-05-03 DIAGNOSIS — R69 Illness, unspecified: Secondary | ICD-10-CM | POA: Diagnosis not present

## 2020-05-03 DIAGNOSIS — F339 Major depressive disorder, recurrent, unspecified: Secondary | ICD-10-CM | POA: Diagnosis not present

## 2020-05-03 DIAGNOSIS — K219 Gastro-esophageal reflux disease without esophagitis: Secondary | ICD-10-CM | POA: Diagnosis not present

## 2020-05-03 DIAGNOSIS — E782 Mixed hyperlipidemia: Secondary | ICD-10-CM

## 2020-05-03 DIAGNOSIS — Z23 Encounter for immunization: Secondary | ICD-10-CM

## 2020-05-03 DIAGNOSIS — F411 Generalized anxiety disorder: Secondary | ICD-10-CM

## 2020-05-03 MED ORDER — DIAZEPAM 5 MG PO TABS: 5.0000 mg | ORAL_TABLET | Freq: Every evening | ORAL | 2 refills | Status: DC | PRN

## 2020-05-03 NOTE — Progress Notes (Signed)
Ht '5\' 3"'  (1.6 m)   Wt 170 lb (77.1 kg)   BMI 30.11 kg/m    Subjective:   Patient ID: Krystal [name], female    DOB: 1941/04/25, 79 y.o.   MRN: [id number]  HPI: Krystal [name] is a 79 y.o. female presenting on 05/03/2020 for Medical Management of Chronic Issues, Hyperlipidemia, and Gastroesophageal Reflux   HPI Hyperlipidemia Patient is coming in for recheck of his hyperlipidemia. The patient is currently taking simvastatin. They deny any issues with myalgias or history of liver damage from it. They deny any focal numbness or weakness or chest pain.   Asthma Patient is coming in for COPD recheck today.  He is currently on DuoNeb and Singulair and albuterol.  He has a mild chronic cough but denies any major coughing spells or wheezing spells.  He has 2nighttime symptoms per week and 3daytime symptoms per week currently.   GERD Patient is currently on Nexium.  She denies any major symptoms or abdominal pain or belching or burping. She denies any blood in her stool or lightheadedness or dizziness.   Depression and anxiety and insomnia Current rx-diazepam 5 mg nightly as needed, does not use every night # meds rx-30 Effectiveness of current meds-working well, does not need every night Adverse reactions form meds-none currently  Pill count performed-No Last drug screen -11/11/2019 ( high risk q58m moderate risk q644mlow risk yearly ) Urine drug screen today- No Was the NCCreweeviewed-yes  If yes were their any concerning findings? -None  No flowsheet data found.   Controlled substance contract signed on: 11/11/2019  Relevant past medical, surgical, family and social history reviewed and updated as indicated. Interim medical history since our last visit reviewed. Allergies and medications reviewed and updated.  Review of Systems  Constitutional: Negative for chills and fever.  HENT: Positive for congestion. Negative for ear discharge and ear pain.   Eyes: Negative for  redness and visual disturbance.  Respiratory: Positive for cough. Negative for chest tightness, shortness of breath and wheezing.   Cardiovascular: Negative for chest pain and leg swelling.  Genitourinary: Negative for difficulty urinating and dysuria.  Musculoskeletal: Negative for back pain and gait problem.  Skin: Negative for rash.  Neurological: Negative for light-headedness and headaches.  Psychiatric/Behavioral: Negative for agitation and behavioral problems.  All other systems reviewed and are negative.   Per HPI unless specifically indicated above   Allergies as of 05/03/2020      Reactions   Actonel [risedronate] Nausea And Vomiting, Other (See Comments)   Throat tightness   Fosamax [alendronate Sodium] Other (See Comments)   esophagitis   Mevacor [lovastatin] Other (See Comments)   myalgias   Miacalcin [calcitonin (salmon)] Nausea And Vomiting   Mobic [meloxicam] Hives, Itching   rash      Medication List       Accurate as of May 03, 2020  2:11 PM. If you have any questions, ask your nurse or doctor.        albuterol 108 (90 Base) MCG/ACT inhaler Commonly known as: Ventolin HFA INHALE TWO PUFFS BY MOUTH 4 TIMES DAILY AS NEEDED   aspirin EC 81 MG tablet Take 81 mg by mouth at bedtime.   benzonatate 100 MG capsule Commonly known as: Tessalon Perles Take 1 capsule (100 mg total) by mouth 3 (three) times daily as needed for cough.   Calcium Citrate-Vitamin D 315-250 MG-UNIT Tabs Commonly known as: Calcium Citrate + D Take 2 tablets by mouth daily.  cetirizine 10 MG tablet Commonly known as: ZYRTEC Take 5-10 mg by mouth at bedtime.   diazepam 5 MG tablet Commonly known as: VALIUM Take 1 tablet (5 mg total) by mouth at bedtime as needed.   diltiazem 300 MG 24 hr capsule Commonly known as: CARDIZEM CD TAKE 1 CAPSULE BY MOUTH EVERY DAY   esomeprazole 40 MG capsule Commonly known as: NEXIUM TAKE 1 CAPSULE BY MOUTH EVERY DAY   ferrous sulfate  325 (65 FE) MG tablet Take 1 tablet (325 mg total) by mouth at bedtime.   ipratropium-albuterol 0.5-2.5 (3) MG/3ML Soln Commonly known as: DUONEB Take 3 mLs by nebulization every 6 (six) hours as needed. Dx:  Asthma 493.90   metoprolol tartrate 25 MG tablet Commonly known as: LOPRESSOR TAKE 1 TABLET (25 MG TOTAL) BY MOUTH DAILY AS NEEDED.   montelukast 10 MG tablet Commonly known as: SINGULAIR TAKE 1 TABLET BY MOUTH EVERYDAY AT BEDTIME   nitroGLYCERIN 0.4 MG/SPRAY spray Commonly known as: NITROLINGUAL ONE SPRAY UNDER TONGUE AS NEED FOR CHESTPAIN. MAY REPEAT IN 5 MINUTES IF PAIN NOT RELIEVED CALL 911.   PRESERVISION/LUTEIN PO Take 1 capsule by mouth at bedtime.   Prolia 60 MG/ML Sosy injection Generic drug: denosumab TO BE ADMINISTERED IN PHYSICIAN'S OFFICE. INJECT ONE SYRINGE SUBCOUSLY ONCE EVERY 6 MONTHS. REFRIGERATE. USE WITHIN 14 DAYS ONCE AT ROOM TEMPERATURE.   Restasis 0.05 % ophthalmic emulsion Generic drug: cycloSPORINE Place 1 drop into both eyes 2 (two) times daily.   simvastatin 10 MG tablet Commonly known as: ZOCOR TAKE 1 TABLET BY MOUTH EVERY DAY   Vitamin D 50 MCG (2000 UT) Caps Take 1 capsule by mouth at bedtime.        Objective:   Ht '5\' 3"'  (1.6 m)   Wt 170 lb (77.1 kg)   BMI 30.11 kg/m   Wt Readings from Last 3 Encounters:  05/03/20 170 lb (77.1 kg)  11/09/19 171 lb (77.6 kg)  11/01/19 169 lb 8 oz (76.9 kg)    Physical Exam Vitals and nursing note reviewed.  Constitutional:      General: She is not in acute distress.    Appearance: She is well-developed. She is not diaphoretic.  Eyes:     Conjunctiva/sclera: Conjunctivae normal.  Cardiovascular:     Rate and Rhythm: Normal rate and regular rhythm.     Heart sounds: Normal heart sounds. No murmur heard.   Pulmonary:     Effort: Pulmonary effort is normal. No respiratory distress.     Breath sounds: Rhonchi present. No wheezing.  Musculoskeletal:        General: No tenderness. Normal  range of motion.  Skin:    General: Skin is warm and dry.     Findings: No rash.  Neurological:     Mental Status: She is alert and oriented to person, place, and time.     Coordination: Coordination normal.  Psychiatric:        Behavior: Behavior normal.       Assessment & Plan:   Problem List Items Addressed This Visit      Digestive   GERD   Relevant Orders   CBC with Differential/Platelet   CMP14+EGFR     Other   Mixed hyperlipidemia - Primary   Relevant Orders   Lipid panel   Anxiety state   Relevant Medications   diazepam (VALIUM) 5 MG tablet   Other Relevant Orders   TSH   Depression, recurrent (HCC)   Relevant Medications   diazepam (  VALIUM) 5 MG tablet   Other Relevant Orders   CMP14+EGFR   TSH    Other Visit Diagnoses    Flu vaccine need       Relevant Orders   Flu Vaccine QUAD High Dose(Fluad) (Completed)      Gave sample of Breo for patient's asthma, sounds like it is flaring up a little bit with her allergies.  She gets exertional dyspnea as well.  See if the Breo Ellipta helps  Continue other medication, follow-up in 3 months Follow up plan: Return in about 3 months (around 08/02/2020), or if symptoms worsen or fail to improve, for Follow-up anxiety and asthma and hyperlipidemia.  Counseling provided for all of the vaccine components Orders Placed This Encounter  Procedures  . Flu Vaccine QUAD High Dose(Fluad)  . CBC with Differential/Platelet  . CMP14+EGFR  . Lipid panel  . TSH    Caryl Pina, MD [facility] 05/03/2020, 2:11 PM

## 2020-05-04 LAB — CMP14+EGFR
ALT: 16 IU/L (ref 0–32)
AST: 24 IU/L (ref 0–40)
Albumin/Globulin Ratio: 1.9 (ref 1.2–2.2)
Albumin: 4.4 g/dL (ref 3.6–4.6)
Alkaline Phosphatase: 62 IU/L (ref 44–121)
BUN/Creatinine Ratio: 19 (ref 12–28)
BUN: 17 mg/dL (ref 8–27)
Bilirubin Total: 0.4 mg/dL (ref 0.0–1.2)
CO2: 27 mmol/L (ref 20–29)
Calcium: 9.7 mg/dL (ref 8.7–10.3)
Chloride: 98 mmol/L (ref 96–106)
Creatinine, Ser: 0.9 mg/dL (ref 0.57–1.00)
GFR calc Af Amer: 69 mL/min/{1.73_m2} (ref >59)
GFR calc non Af Amer: 60 mL/min/{1.73_m2} (ref >59)
Globulin, Total: 2.3 g/dL (ref 1.5–4.5)
Glucose: 96 mg/dL (ref 65–99)
Potassium: 4.6 mmol/L (ref 3.5–5.2)
Sodium: 139 mmol/L (ref 134–144)
Total Protein: 6.7 g/dL (ref 6.0–8.5)

## 2020-05-04 LAB — CBC WITH DIFFERENTIAL/PLATELET
Basophils Absolute: 0 10*3/uL (ref 0.0–0.2)
Basos: 1 %
EOS (ABSOLUTE): 0.2 10*3/uL (ref 0.0–0.4)
Eos: 2 %
Hematocrit: 44 % (ref 34.0–46.6)
Hemoglobin: 14.8 g/dL (ref 11.1–15.9)
Immature Grans (Abs): 0 10*3/uL (ref 0.0–0.1)
Immature Granulocytes: 0 %
Lymphocytes Absolute: 2 10*3/uL (ref 0.7–3.1)
Lymphs: 26 %
MCH: 29.5 pg (ref 26.6–33.0)
MCHC: 33.6 g/dL (ref 31.5–35.7)
MCV: 88 fL (ref 79–97)
Monocytes Absolute: 0.5 10*3/uL (ref 0.1–0.9)
Monocytes: 7 %
Neutrophils Absolute: 4.9 10*3/uL (ref 1.4–7.0)
Neutrophils: 64 %
Platelets: 196 10*3/uL (ref 150–450)
RBC: 5.02 x10E6/uL (ref 3.77–5.28)
RDW: 12.5 % (ref 11.7–15.4)
WBC: 7.6 10*3/uL (ref 3.4–10.8)

## 2020-05-04 LAB — LIPID PANEL
Chol/HDL Ratio: 3 ratio (ref 0.0–4.4)
Cholesterol, Total: 194 mg/dL (ref 100–199)
HDL: 64 mg/dL (ref >39)
LDL Chol Calc (NIH): 108 mg/dL — ABNORMAL HIGH (ref 0–99)
Triglycerides: 127 mg/dL (ref 0–149)
VLDL Cholesterol Cal: 22 mg/dL (ref 5–40)

## 2020-05-04 LAB — TSH: TSH: 1.81 u[IU]/mL (ref 0.450–4.500)

## 2020-05-14 ENCOUNTER — Telehealth: Payer: Self-pay

## 2020-05-25 NOTE — Telephone Encounter (Signed)
It was ordered through [facility] [place] per Joycelyn Schmid with Prolia.

## 2020-05-29 ENCOUNTER — Other Ambulatory Visit: Payer: Self-pay | Admitting: Family Medicine

## 2020-05-29 DIAGNOSIS — Z1231 Encounter for screening mammogram for malignant neoplasm of breast: Secondary | ICD-10-CM

## 2020-06-11 DIAGNOSIS — H353124 Nonexudative age-related macular degeneration, left eye, advanced atrophic with subfoveal involvement: Secondary | ICD-10-CM | POA: Diagnosis not present

## 2020-06-11 DIAGNOSIS — H353211 Exudative age-related macular degeneration, right eye, with active choroidal neovascularization: Secondary | ICD-10-CM | POA: Diagnosis not present

## 2020-06-20 ENCOUNTER — Other Ambulatory Visit: Payer: Self-pay | Admitting: Family Medicine

## 2020-07-02 ENCOUNTER — Other Ambulatory Visit: Payer: Self-pay | Admitting: Family Medicine

## 2020-07-02 ENCOUNTER — Other Ambulatory Visit: Payer: Self-pay

## 2020-07-02 ENCOUNTER — Ambulatory Visit
Admission: RE | Admit: 2020-07-02 | Discharge: 2020-07-02 | Disposition: A | Discharge status: Home / Self Care | Payer: Medicare HMO | Source: Ambulatory Visit | Attending: Family Medicine | Admitting: Family Medicine

## 2020-07-02 DIAGNOSIS — Z1231 Encounter for screening mammogram for malignant neoplasm of breast: Secondary | ICD-10-CM

## 2020-08-07 ENCOUNTER — Encounter: Payer: Self-pay | Admitting: Family Medicine

## 2020-08-07 ENCOUNTER — Ambulatory Visit (INDEPENDENT_AMBULATORY_CARE_PROVIDER_SITE_OTHER): Payer: Medicare HMO | Admitting: Family Medicine

## 2020-08-07 ENCOUNTER — Other Ambulatory Visit: Payer: Self-pay

## 2020-08-07 VITALS — BP 125/68 | HR 82 | Ht 63.0 in | Wt 170.0 lb

## 2020-08-07 DIAGNOSIS — Z1211 Encounter for screening for malignant neoplasm of colon: Secondary | ICD-10-CM | POA: Diagnosis not present

## 2020-08-07 DIAGNOSIS — E782 Mixed hyperlipidemia: Secondary | ICD-10-CM | POA: Diagnosis not present

## 2020-08-07 DIAGNOSIS — Z23 Encounter for immunization: Secondary | ICD-10-CM

## 2020-08-07 DIAGNOSIS — M7061 Trochanteric bursitis, right hip: Secondary | ICD-10-CM

## 2020-08-07 DIAGNOSIS — R69 Illness, unspecified: Secondary | ICD-10-CM | POA: Diagnosis not present

## 2020-08-07 DIAGNOSIS — F411 Generalized anxiety disorder: Secondary | ICD-10-CM | POA: Diagnosis not present

## 2020-08-07 DIAGNOSIS — K219 Gastro-esophageal reflux disease without esophagitis: Secondary | ICD-10-CM

## 2020-08-07 DIAGNOSIS — F339 Major depressive disorder, recurrent, unspecified: Secondary | ICD-10-CM

## 2020-08-07 DIAGNOSIS — Z79891 Long term (current) use of opiate analgesic: Secondary | ICD-10-CM | POA: Diagnosis not present

## 2020-08-07 MED ORDER — METHYLPREDNISOLONE ACETATE 80 MG/ML IJ SUSP: 80.0000 mg | Freq: Once | INTRAMUSCULAR | Status: DC

## 2020-08-07 NOTE — Progress Notes (Addendum)
BP 125/68   Pulse 82   Ht 5\' 3"  (1.6 m)   Wt 170 lb (77.1 kg)   SpO2 96%   BMI 30.11 kg/m    Subjective:   Patient ID: Krystal [name], female    DOB: 1940/11/26, 80 y.o.   MRN: [id number]  HPI: Krystal [name] is a 80 y.o. female presenting on 08/07/2020 for Medical Management of Chronic Issues and Hyperlipidemia   HPI Anxiety Current rx-Valium 5 mg nightly as needed # meds rx-30 Effectiveness of current meds-works well, does not use every night only uses when needed Adverse reactions form meds-none  Pill count performed-No Last drug screen -11/11/2019 ( high risk q42m, moderate risk q23m, low risk yearly ) Urine drug screen today- Yes Was the NCCSR reviewed-yes  If yes were their any concerning findings? -None  No flowsheet data found.   Controlled substance contract signed on: Today  Hyperlipidemia Patient is coming in for recheck of his hyperlipidemia. The patient is currently taking simvastatin. They deny any issues with myalgias or history of liver damage from it. They deny any focal numbness or weakness or chest pain.   GERD Patient is currently on Nexium.  She denies any major symptoms or abdominal pain or belching or burping. She denies any blood in her stool or lightheadedness or dizziness.   Relevant past medical, surgical, family and social history reviewed and updated as indicated. Interim medical history since our last visit reviewed. Allergies and medications reviewed and updated.  Review of Systems  Constitutional: Negative for chills and fever.  Eyes: Negative for visual disturbance.  Respiratory: Negative for chest tightness and shortness of breath.   Cardiovascular: Negative for chest pain and leg swelling.  Musculoskeletal: Positive for arthralgias and myalgias. Negative for back pain and gait problem.  Skin: Negative for rash.  Neurological: Negative for light-headedness and headaches.  Psychiatric/Behavioral: Negative for agitation and  behavioral problems.  All other systems reviewed and are negative.   Per HPI unless specifically indicated above   Allergies as of 08/07/2020      Reactions   Actonel [risedronate] Nausea And Vomiting, Other (See Comments)   Throat tightness   Fosamax [alendronate Sodium] Other (See Comments)   esophagitis   Mevacor [lovastatin] Other (See Comments)   myalgias   Miacalcin [calcitonin (salmon)] Nausea And Vomiting   Mobic [meloxicam] Hives, Itching   rash      Medication List       Accurate as of August 07, 2020  1:30 PM. If you have any questions, ask your nurse or doctor.        albuterol 108 (90 Base) MCG/ACT inhaler Commonly known as: Ventolin HFA INHALE TWO PUFFS BY MOUTH 4 TIMES DAILY AS NEEDED   aspirin EC 81 MG tablet Take 81 mg by mouth at bedtime.   benzonatate 100 MG capsule Commonly known as: Tessalon Perles Take 1 capsule (100 mg total) by mouth 3 (three) times daily as needed for cough.   Calcium Citrate-Vitamin D 315-250 MG-UNIT Tabs Commonly known as: Calcium Citrate + D Take 2 tablets by mouth daily.   cetirizine 10 MG tablet Commonly known as: ZYRTEC Take 5-10 mg by mouth at bedtime.   diazepam 5 MG tablet Commonly known as: VALIUM Take 1 tablet (5 mg total) by mouth at bedtime as needed.   diltiazem 300 MG 24 hr capsule Commonly known as: CARDIZEM CD TAKE 1 CAPSULE BY MOUTH EVERY DAY   esomeprazole 40 MG capsule Commonly known as: NEXIUM  TAKE 1 CAPSULE BY MOUTH EVERY DAY   ferrous sulfate 325 (65 FE) MG tablet Take 1 tablet (325 mg total) by mouth at bedtime.   ipratropium-albuterol 0.5-2.5 (3) MG/3ML Soln Commonly known as: DUONEB Take 3 mLs by nebulization every 6 (six) hours as needed. Dx:  Asthma 493.90   metoprolol tartrate 25 MG tablet Commonly known as: LOPRESSOR TAKE 1 TABLET (25 MG TOTAL) BY MOUTH DAILY AS NEEDED.   montelukast 10 MG tablet Commonly known as: SINGULAIR TAKE 1 TABLET BY MOUTH EVERYDAY AT BEDTIME    nitroGLYCERIN 0.4 MG/SPRAY spray Commonly known as: NITROLINGUAL ONE SPRAY UNDER TONGUE AS NEED FOR CHESTPAIN. MAY REPEAT IN 5 MINUTES IF PAIN NOT RELIEVED CALL 911.   PRESERVISION/LUTEIN PO Take 1 capsule by mouth at bedtime.   Prolia 60 MG/ML Sosy injection Generic drug: denosumab TO BE ADMINISTERED IN PHYSICIAN'S OFFICE. INJECT ONE SYRINGE SUBCOUSLY ONCE EVERY 6 MONTHS. REFRIGERATE. USE WITHIN 14 DAYS ONCE AT ROOM TEMPERATURE.   Restasis 0.05 % ophthalmic emulsion Generic drug: cycloSPORINE Place 1 drop into both eyes 2 (two) times daily.   simvastatin 10 MG tablet Commonly known as: ZOCOR TAKE 1 TABLET BY MOUTH EVERY DAY   Vitamin D 50 MCG (2000 UT) Caps Take 1 capsule by mouth at bedtime.        Objective:   BP 125/68   Pulse 82   Ht 5\' 3"  (1.6 m)   Wt 170 lb (77.1 kg)   SpO2 96%   BMI 30.11 kg/m   Wt Readings from Last 3 Encounters:  08/07/20 170 lb (77.1 kg)  05/03/20 170 lb (77.1 kg)  11/09/19 171 lb (77.6 kg)    Physical Exam Vitals and nursing note reviewed.  Constitutional:      General: She is not in acute distress.    Appearance: She is well-developed and well-nourished. She is not diaphoretic.  Eyes:     Extraocular Movements: EOM normal.     Conjunctiva/sclera: Conjunctivae normal.  Cardiovascular:     Rate and Rhythm: Normal rate and regular rhythm.     Pulses: Intact distal pulses.     Heart sounds: Murmur heard.   Crescendo decrescendo systolic (At left second intercostal space heard greatest.) murmur is present with a grade of 2/6.   Pulmonary:     Effort: Pulmonary effort is normal. No respiratory distress.     Breath sounds: Normal breath sounds. No wheezing.  Musculoskeletal:        General: No edema. Normal range of motion.     Right hip: Tenderness (Lateral tenderness in the location of trochanteric bursitis) present.  Skin:    General: Skin is warm and dry.     Findings: No rash.  Neurological:     Mental Status: She is alert  and oriented to person, place, and time.     Coordination: Coordination normal.  Psychiatric:        Mood and Affect: Mood and affect normal.        Behavior: Behavior normal.     Right trochanteric bursitis injection: Consent form signed. Risk factors of bleeding and infection discussed with patient and patient is agreeable towards injection. Patient prepped with Betadine. Lateral approach towards injection used. Injected 80 mg of Depo-Medrol and 1 mL of 2% lidocaine. Patient tolerated procedure well and no side effects from noted. Minimal to no bleeding. Simple bandage applied after.   Assessment & Plan:   Problem List Items Addressed This Visit      Digestive  GERD     Other   Mixed hyperlipidemia   Anxiety state   Relevant Orders   ToxASSURE Select 60 (MW), Urine   TSH   Depression, recurrent ([place]) - Primary   Relevant Orders   ToxASSURE Select 13 (MW), Urine   TSH    Other Visit Diagnoses    Colon cancer screening       Relevant Orders   Ambulatory referral to Gastroenterology   Need for Tdap vaccination       Relevant Orders   Tdap vaccine greater than or equal to 7yo IM   Greater trochanteric bursitis of right hip       Relevant Medications   methylPREDNISolone acetate (DEPO-MEDROL) injection 80 mg     Will do bursitis injection, continue current medication, no changes.  Continue to use Valium as sparingly as he can.  She does have follow-up with cardiology for echocardiogram to recheck on the murmur.  Patient has become gradually weaker and more unstable and family would like a shower chair. Follow up plan: Return in about 3 months (around 11/05/2020), or if symptoms worsen or fail to improve, for Anxiety and depression recheck.  Counseling provided for all of the vaccine components Orders Placed This Encounter  Procedures  . Tdap vaccine greater than or equal to 7yo IM  . ToxASSURE Select 13 (MW), Urine  . Ambulatory referral to Gastroenterology    Caryl Pina, MD [facility] 08/07/2020, 1:30 PM

## 2020-08-08 ENCOUNTER — Ambulatory Visit (INDEPENDENT_AMBULATORY_CARE_PROVIDER_SITE_OTHER): Payer: Medicare HMO | Admitting: *Deleted

## 2020-08-08 DIAGNOSIS — Z0000 Encounter for general adult medical examination without abnormal findings: Secondary | ICD-10-CM | POA: Diagnosis not present

## 2020-08-08 LAB — TSH: TSH: 1.75 u[IU]/mL (ref 0.450–4.500)

## 2020-08-08 NOTE — Patient Instructions (Signed)
  Ms. Lansing , Thank you for taking time to come for your Medicare Wellness Visit. I appreciate your ongoing commitment to your health goals. Please review the following plan we discussed and let me know if I can assist you in the future.   These are the goals we discussed: Goals    . DIET - EAT MORE FRUITS AND VEGETABLES    . DIET - INCREASE WATER INTAKE    . Exercise 150 min/wk Moderate Activity       This is a list of the screening recommended for you and due dates:  Health Maintenance  Topic Date Due  . DEXA scan (bone density measurement)  11/05/2020*  . Colon Cancer Screening  02/04/2021*  . COVID-19 Vaccine (4 - Booster for Moderna series) 09/21/2020  . Mammogram  07/02/2022  . Tetanus Vaccine  08/07/2030  . Flu Shot  Completed  . Pneumonia vaccines  Completed  *Topic was postponed. The date shown is not the original due date.

## 2020-08-08 NOTE — Progress Notes (Signed)
MEDICARE ANNUAL WELLNESS VISIT  08/08/2020  Telephone Visit Disclaimer This Medicare AWV was conducted by telephone due to national recommendations for restrictions regarding the COVID-19 Pandemic (e.g. social distancing).  I verified, using two identifiers, that I am speaking with Krystal [name] or their authorized healthcare agent. I discussed the limitations, risks, security, and privacy concerns of performing an evaluation and management service by telephone and the potential availability of an in-person appointment in the future. The patient expressed understanding and agreed to proceed.  Location of Patient: home Location of Provider (nurse):  office  Subjective:    Krystal [name] is a 80 y.o. female patient of Dettinger, Fransisca Kaufmann, MD who had a Medicare Annual Wellness Visit today via telephone. 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 she has 1 child. she reports that she is socially active and does interact with friends/family regularly. she is minimally physically active and enjoys watching TV and used to enjoy reading and doing word puzzles before her vision got bad.  Patient Care Team: Dettinger, Fransisca Kaufmann, MD as PCP - General (Family Medicine) Milus Banister, MD (Gastroenterology) Sherlynn Stalls, MD as Consulting Physician (Ophthalmology) Satira Sark, MD as Consulting Physician (Cardiology) Okey Regal, [place] as Consulting Physician (Optometry) Minus Breeding, MD as Consulting Physician (Cardiology)  Advanced Directives 08/08/2020 02/23/2019 01/13/2018 09/08/2017 01/05/2017 11/14/2016 10/16/2016  Does Patient Have a Medical Advance Directive? No No No No No No No  Would patient like information on creating a medical advance directive? No - Patient declined No - Patient declined No - Patient declined - Yes (MAU/Ambulatory/Procedural Areas - Information given) No - Patient declined -    Hospital Utilization Over the Past 12 Months: # of  hospitalizations or ER visits: 0 # of surgeries: 0  Review of Systems    Patient reports that her overall health is unchanged compared to last year.  History obtained from the patient  Patient Reported Readings (BP, Pulse, CBG, Weight, etc) none  Pain Assessment Pain : 0-10 Pain Score: 6  Pain Type: Chronic pain Pain Location: Back Pain Descriptors / Indicators: Burning,Throbbing Pain Onset: Other (comment) (chronic pain for over 10 years) Pain Frequency: Constant Pain Relieving Factors: nothing helps Effect of Pain on Daily Activities: moderately  Pain Relieving Factors: nothing helps  Current Medications & Allergies (verified) Allergies as of 08/08/2020      Reactions   Actonel [risedronate] Nausea And Vomiting, Other (See Comments)   Throat tightness   Fosamax [alendronate Sodium] Other (See Comments)   esophagitis   Mevacor [lovastatin] Other (See Comments)   myalgias   Miacalcin [calcitonin (salmon)] Nausea And Vomiting   Mobic [meloxicam] Hives, Itching   rash      Medication List       Accurate as of August 08, 2020 10:24 AM. If you have any questions, ask your nurse or doctor.        STOP taking these medications   benzonatate 100 MG capsule Commonly known as: Tessalon Perles     TAKE these medications   albuterol 108 (90 Base) MCG/ACT inhaler Commonly known as: Ventolin HFA INHALE TWO PUFFS BY MOUTH 4 TIMES DAILY AS NEEDED   aspirin EC 81 MG tablet Take 81 mg by mouth at bedtime.   Calcium Citrate-Vitamin D 315-250 MG-UNIT Tabs Commonly known as: Calcium Citrate + D Take 2 tablets by mouth daily.   cetirizine 10 MG tablet Commonly known as: ZYRTEC Take 5-10 mg by mouth  at bedtime.   diazepam 5 MG tablet Commonly known as: VALIUM Take 1 tablet (5 mg total) by mouth at bedtime as needed.   diltiazem 300 MG 24 hr capsule Commonly known as: CARDIZEM CD TAKE 1 CAPSULE BY MOUTH EVERY DAY   esomeprazole 40 MG capsule Commonly known as:  NEXIUM TAKE 1 CAPSULE BY MOUTH EVERY DAY   ferrous sulfate 325 (65 FE) MG tablet Take 1 tablet (325 mg total) by mouth at bedtime.   ipratropium-albuterol 0.5-2.5 (3) MG/3ML Soln Commonly known as: DUONEB Take 3 mLs by nebulization every 6 (six) hours as needed. Dx:  Asthma 493.90   metoprolol tartrate 25 MG tablet Commonly known as: LOPRESSOR TAKE 1 TABLET (25 MG TOTAL) BY MOUTH DAILY AS NEEDED.   montelukast 10 MG tablet Commonly known as: SINGULAIR TAKE 1 TABLET BY MOUTH EVERYDAY AT BEDTIME   nitroGLYCERIN 0.4 MG/SPRAY spray Commonly known as: NITROLINGUAL ONE SPRAY UNDER TONGUE AS NEED FOR CHESTPAIN. MAY REPEAT IN 5 MINUTES IF PAIN NOT RELIEVED CALL 911.   PRESERVISION/LUTEIN PO Take 1 capsule by mouth at bedtime.   Prolia 60 MG/ML Sosy injection Generic drug: denosumab TO BE ADMINISTERED IN PHYSICIAN'S OFFICE. INJECT ONE SYRINGE SUBCOUSLY ONCE EVERY 6 MONTHS. REFRIGERATE. USE WITHIN 14 DAYS ONCE AT ROOM TEMPERATURE.   Restasis 0.05 % ophthalmic emulsion Generic drug: cycloSPORINE Place 1 drop into both eyes 2 (two) times daily.   simvastatin 10 MG tablet Commonly known as: ZOCOR TAKE 1 TABLET BY MOUTH EVERY DAY   Vitamin D 50 MCG (2000 UT) Caps Take 1 capsule by mouth at bedtime.       History (reviewed): Past Medical History:  Diagnosis Date  . Anemia   . Asthma   . Brain tumor ([place])    1986  . Breast cancer ([place])    Right breast  . Cataract   . Colon polyps   . COPD (chronic obstructive pulmonary disease) ([place])   . Hiatal hernia   . Hyperlipidemia   . Leg edema   . Macular degeneration   . Osteoporosis   . Pneumonia   . PVD (posterior vitreous detachment)   . Right knee pain   . Shingles rash    Past Surgical History:  Procedure Laterality Date  . CATARACT EXTRACTION, BILATERAL    . COLONOSCOPY    . CRANIECTOMY / CRANIOTOMY FOR EXCISION OF BRAIN TUMOR     Left side brain  . MASTECTOMY Right   . PARTIAL HYSTERECTOMY    . POLYPECTOMY    .  SKIN CANCER EXCISION Left    Under left eye   Family History  Problem Relation Age of Onset  . Heart disease Mother 82       Stroke   . Dementia Mother   . Congestive Heart Failure Mother   . Osteoporosis Mother   . Stroke Mother   . Heart disease Sister   . Hypertension Sister   . Diabetes Sister   . Osteoporosis Sister   . Heart disease Brother   . Hyperlipidemia Brother   . Diabetes Brother   . Heart disease Sister   . Hypertension Sister   . Diabetes Sister   . Diabetes Sister   . Hypertension Sister   . Diabetes Sister   . Hypertension Sister   . Diabetes Brother   . Hyperlipidemia Brother   . Cancer Father        lung  . Heart attack Father   . Diabetes Brother   . Heart disease  Brother   . Hyperlipidemia Brother   . Diabetes Brother   . Diabetes Son   . Colon cancer Neg Hx   . Rectal cancer Neg Hx   . Stomach cancer Neg Hx   . Esophageal cancer Neg Hx    Social History   Socioeconomic History  . Marital status: Widowed    Spouse name: Not on file  . Number of children: 1  . Years of education: 33th  . Highest education level: 7th grade  Occupational History  . Occupation: retired  Tobacco Use  . Smoking status: Former Smoker    Types: Cigarettes    Quit date: 02/09/1961    Years since quitting: 59.5  . Smokeless tobacco: Never Used  Vaping Use  . Vaping Use: Never used  Substance and Sexual Activity  . Alcohol use: No    Alcohol/week: 0.0 standard drinks  . Drug use: No  . Sexual activity: Not Currently  Other Topics Concern  . Not on file  Social History Narrative   Son lives with patient.   Retired from farming.   Highest level of education:  7th   Social Determinants of Health   Financial Resource Strain: Low Risk   . Difficulty of Paying Living Expenses: Not hard at all  Food Insecurity: No Food Insecurity  . Worried About Charity fundraiser in the Last Year: Never true  . Ran Out of Food in the Last Year: Never true   Transportation Needs: No Transportation Needs  . Lack of Transportation (Medical): No  . Lack of Transportation (Non-Medical): No  Physical Activity: Inactive  . Days of Exercise per Week: 0 days  . Minutes of Exercise per Session: 0 min  Stress: No Stress Concern Present  . Feeling of Stress : Not at all  Social Connections: Moderately Integrated  . Frequency of Communication with Friends and Family: More than three times a week  . Frequency of Social Gatherings with Friends and Family: More than three times a week  . Attends Religious Services: More than 4 times per year  . Active Member of Clubs or Organizations: Yes  . Attends Archivist Meetings: More than 4 times per year  . Marital Status: Widowed    Activities of Daily Living In your present state of health, do you have any difficulty performing the following activities: 08/08/2020  Hearing? N  Vision? Y  Comment macular degeneration  Difficulty concentrating or making decisions? N  Walking or climbing stairs? Y  Comment due to back and knee pain  Dressing or bathing? N  Doing errands, shopping? Y  Comment pt's son drives her everywhere as she can't see  Preparing Food and eating ? N  Using the Toilet? N  In the past six months, have you accidently leaked urine? N  Do you have problems with loss of bowel control? N  Managing your Medications? N  Managing your Finances? N  Housekeeping or managing your Housekeeping? N  Some recent data might be hidden    Patient Education/ Literacy How often do you need to have someone help you when you read instructions, pamphlets, or other written materials from your doctor or pharmacy?: 1 - Never What is the last grade level you completed in school?: 7th grade  Exercise Current Exercise Habits: The patient does not participate in regular exercise at present, Exercise limited by: orthopedic condition(s);respiratory conditions(s)  Diet Patient reports consuming 2 meals  a day and 1 snack(s) a day Patient reports  that her primary diet is: Regular Patient reports that she does have regular access to food.   Depression Screen PHQ 2/9 Scores 08/08/2020 08/07/2020 05/03/2020 11/01/2019 03/24/2019 07/21/2018 03/06/2018  PHQ - 2 Score 0 0 0 0 0 0 0     Fall Risk Fall Risk  08/08/2020 08/07/2020 05/03/2020 11/01/2019 03/24/2019  Falls in the past year? 0 0 0 0 0  Number falls in past yr: - - - - -  Comment - - - - -  Injury with Fall? - - - - -  Comment - - - - -  Risk Factor Category  - - - - -  Risk for fall due to : No Fall Risks - - - -  Risk for fall due to: Comment - - - - -  Follow up Falls evaluation completed - - - -     Objective:  Krystal [name] seemed alert and oriented and she participated appropriately during our telephone visit.  Blood Pressure Weight BMI  BP Readings from Last 3 Encounters:  08/07/20 125/68  11/09/19 120/70  11/01/19 117/67   Wt Readings from Last 3 Encounters:  08/07/20 170 lb (77.1 kg)  05/03/20 170 lb (77.1 kg)  11/09/19 171 lb (77.6 kg)   BMI Readings from Last 1 Encounters:  08/07/20 30.11 kg/m    *Unable to obtain current vital signs, weight, and BMI due to telephone visit type  Hearing/Vision  . Dekisha did not seem to have difficulty with hearing/understanding during the telephone conversation . Reports that she has had a formal eye exam by an eye care professional within the past year . Reports that she has not had a formal hearing evaluation within the past year *Unable to fully assess hearing and vision during telephone visit type  Cognitive Function: 6CIT Screen 08/08/2020 02/23/2019  What Year? 0 points 0 points  What month? 0 points 0 points  What time? 0 points 0 points  Count back from 20 0 points 0 points  Months in reverse 0 points 0 points  Repeat phrase 4 points 0 points  Total Score 4 0   (Normal:0-7, Significant for Dysfunction: >8)  Normal Cognitive Function Screening: Yes   Immunization  & Health Maintenance Record Immunization History  Administered Date(s) Administered  . Fluad Quad(high Dose 65+) 05/03/2020  . Moderna Sars-Covid-2 Vaccination 08/27/2019, 09/26/2019, 03/21/2020  . Pneumococcal Conjugate-13 06/21/2013  . Pneumococcal Polysaccharide-23 04/26/2005  . Tdap 08/07/2020    Health Maintenance  Topic Date Due  . DEXA SCAN  11/05/2020 (Originally 01/14/2020)  . COLONOSCOPY (Pts 45-33yrs Insurance coverage will need to be confirmed)  02/04/2021 (Originally 04/07/2020)  . COVID-19 Vaccine (4 - Booster for Moderna series) 09/21/2020  . MAMMOGRAM  07/02/2022  . TETANUS/TDAP  08/07/2030  . INFLUENZA VACCINE  Completed  . PNA vac Low Risk Adult  Completed       Assessment  This is a routine wellness examination for Krystal [name].  Health Maintenance: Due or Overdue There are no preventive care reminders to display for this patient.  Krystal [name] does not need a referral for [employer] Assistance: Care Management:   no Social Work:    no Prescription Assistance:  no Nutrition/Diabetes Education:  no   Plan:  Personalized Goals Goals Addressed            This Visit's Progress   . DIET - INCREASE WATER INTAKE        Personalized Health Maintenance & Screening Recommendations  Shingrix vaccine  Lung Cancer Screening Recommended: no (Low Dose CT Chest recommended if Age 32-80 years, 30 pack-year currently smoking OR have quit w/in past 15 years) Hepatitis C Screening recommended: no HIV Screening recommended: no  Advanced Directives: Written information was not prepared per patient's request.  Referrals & Orders No orders of the defined types were placed in this encounter.   Follow-up Plan . Follow-up with Dettinger, Fransisca Kaufmann, MD as planned . Consider Shingrix vaccine at your next visit with your PCP   I have personally reviewed and noted the following in the patient's chart:   . Medical and social history . Use of alcohol,  tobacco or illicit drugs  . Current medications and supplements . Functional ability and status . Nutritional status . Physical activity . Advanced directives . List of other physicians . Hospitalizations, surgeries, and ER visits in previous 12 months . Vitals . Screenings to include cognitive, depression, and falls . Referrals and appointments  In addition, I have reviewed and discussed with Krystal [name] certain preventive protocols, quality metrics, and best practice recommendations. A written personalized care plan for preventive services as well as general preventive health recommendations is available and can be mailed to the patient at her request.      Milas Hock, LPN  [id number]

## 2020-08-10 DIAGNOSIS — H353211 Exudative age-related macular degeneration, right eye, with active choroidal neovascularization: Secondary | ICD-10-CM | POA: Diagnosis not present

## 2020-08-10 LAB — TOXASSURE SELECT 13 (MW), URINE

## 2020-08-28 ENCOUNTER — Other Ambulatory Visit: Payer: Self-pay | Admitting: Family Medicine

## 2020-09-05 ENCOUNTER — Other Ambulatory Visit: Payer: Self-pay

## 2020-09-05 MED ORDER — PROLIA 60 MG/ML ~~LOC~~ SOSY: PREFILLED_SYRINGE | SUBCUTANEOUS | 0 refills | Status: DC

## 2020-09-09 ENCOUNTER — Other Ambulatory Visit: Payer: Self-pay | Admitting: Family Medicine

## 2020-09-13 NOTE — Telephone Encounter (Signed)
Spoke with Elese at [facility] [employer].  Prolia will be delivered 09/14/20.  Called patient and informed. Tried scheduling but she has a family member that is not doing well. Pt wants to call us back to schedule.

## 2020-09-19 ENCOUNTER — Telehealth: Payer: Self-pay

## 2020-09-19 ENCOUNTER — Other Ambulatory Visit: Payer: Self-pay

## 2020-09-19 ENCOUNTER — Ambulatory Visit (INDEPENDENT_AMBULATORY_CARE_PROVIDER_SITE_OTHER): Payer: Medicare HMO | Admitting: Family Medicine

## 2020-09-19 DIAGNOSIS — M810 Age-related osteoporosis without current pathological fracture: Secondary | ICD-10-CM

## 2020-09-19 MED ORDER — DENOSUMAB 60 MG/ML ~~LOC~~ SOSY
60.0000 mg | PREFILLED_SYRINGE | Freq: Once | SUBCUTANEOUS | Status: AC
  Administered 2020-09-19: 60 mg via SUBCUTANEOUS

## 2020-09-19 NOTE — Telephone Encounter (Signed)
Written Rx placed on providers desk

## 2020-09-20 NOTE — Telephone Encounter (Signed)
Pt wants the rx sent to [facility] in [place]

## 2020-09-21 NOTE — Telephone Encounter (Signed)
Pt is calling back checking on prescription for shower chair. She said she is going to [place] today and wants to pick it up

## 2020-09-21 NOTE — Telephone Encounter (Signed)
LMOVM Rx faxed to [facility]

## 2020-10-08 DIAGNOSIS — H353211 Exudative age-related macular degeneration, right eye, with active choroidal neovascularization: Secondary | ICD-10-CM | POA: Diagnosis not present

## 2020-10-08 DIAGNOSIS — H43813 Vitreous degeneration, bilateral: Secondary | ICD-10-CM | POA: Diagnosis not present

## 2020-10-08 DIAGNOSIS — H353124 Nonexudative age-related macular degeneration, left eye, advanced atrophic with subfoveal involvement: Secondary | ICD-10-CM | POA: Diagnosis not present

## 2020-10-08 DIAGNOSIS — H43392 Other vitreous opacities, left eye: Secondary | ICD-10-CM | POA: Diagnosis not present

## 2020-11-01 DIAGNOSIS — J029 Acute pharyngitis, unspecified: Secondary | ICD-10-CM | POA: Diagnosis not present

## 2020-11-01 DIAGNOSIS — R0989 Other specified symptoms and signs involving the circulatory and respiratory systems: Secondary | ICD-10-CM | POA: Diagnosis not present

## 2020-11-01 DIAGNOSIS — R059 Cough, unspecified: Secondary | ICD-10-CM | POA: Diagnosis not present

## 2020-11-05 ENCOUNTER — Ambulatory Visit (INDEPENDENT_AMBULATORY_CARE_PROVIDER_SITE_OTHER): Payer: Medicare HMO | Admitting: Family Medicine

## 2020-11-05 ENCOUNTER — Encounter: Payer: Self-pay | Admitting: Family Medicine

## 2020-11-05 ENCOUNTER — Other Ambulatory Visit: Payer: Self-pay

## 2020-11-05 VITALS — BP 135/72 | HR 78 | Ht 63.0 in | Wt 175.0 lb

## 2020-11-05 DIAGNOSIS — F339 Major depressive disorder, recurrent, unspecified: Secondary | ICD-10-CM

## 2020-11-05 DIAGNOSIS — G8929 Other chronic pain: Secondary | ICD-10-CM | POA: Diagnosis not present

## 2020-11-05 DIAGNOSIS — K219 Gastro-esophageal reflux disease without esophagitis: Secondary | ICD-10-CM | POA: Diagnosis not present

## 2020-11-05 DIAGNOSIS — E782 Mixed hyperlipidemia: Secondary | ICD-10-CM

## 2020-11-05 DIAGNOSIS — F411 Generalized anxiety disorder: Secondary | ICD-10-CM | POA: Diagnosis not present

## 2020-11-05 DIAGNOSIS — R69 Illness, unspecified: Secondary | ICD-10-CM | POA: Diagnosis not present

## 2020-11-05 DIAGNOSIS — M533 Sacrococcygeal disorders, not elsewhere classified: Secondary | ICD-10-CM

## 2020-11-05 MED ORDER — DIAZEPAM 5 MG PO TABS: 5.0000 mg | ORAL_TABLET | Freq: Every evening | ORAL | 2 refills | Status: DC | PRN

## 2020-11-05 MED ORDER — MONTELUKAST SODIUM 10 MG PO TABS: ORAL_TABLET | ORAL | 3 refills | Status: DC

## 2020-11-05 MED ORDER — DILTIAZEM HCL ER COATED BEADS 300 MG PO CP24: 300.0000 mg | ORAL_CAPSULE | Freq: Every day | ORAL | 3 refills | Status: DC

## 2020-11-05 MED ORDER — METOPROLOL TARTRATE 25 MG PO TABS: ORAL_TABLET | ORAL | 3 refills | Status: DC

## 2020-11-05 MED ORDER — ESOMEPRAZOLE MAGNESIUM 40 MG PO CPDR: DELAYED_RELEASE_CAPSULE | ORAL | 3 refills | Status: DC

## 2020-11-05 MED ORDER — DICLOFENAC SODIUM 1 % EX GEL: 2.0000 g | Freq: Four times a day (QID) | CUTANEOUS | 3 refills | Status: DC

## 2020-11-05 MED ORDER — FERROUS SULFATE 325 (65 FE) MG PO TABS: 325.0000 mg | ORAL_TABLET | Freq: Every day | ORAL | 3 refills | Status: DC

## 2020-11-05 MED ORDER — SIMVASTATIN 10 MG PO TABS: 10.0000 mg | ORAL_TABLET | Freq: Every day | ORAL | 3 refills | Status: DC

## 2020-11-05 NOTE — Progress Notes (Signed)
BP 135/72   Pulse 78   Ht _0  (1.6 m)   Wt 175 lb (79.4 kg)   SpO2 95%   BMI 31.00 kg/m    Subjective:   Patient ID: Krystal [name], female    DOB: [birth year], 80 y.o.   MRN: [id number]  HPI: Krystal [name] is a 80 y.o. female presenting on 11/05/2020 for Medical Management of Chronic Issues, Hyperlipidemia, and Depression   HPI Hyperlipidemia Patient is coming in for recheck of his hyperlipidemia. The patient is currently taking simvastatin. They deny any issues with myalgias or history of liver damage from it. They deny any focal numbness or weakness or chest pain.   GERD Patient is currently on Nexium.  She denies any major symptoms or abdominal pain or belching or burping. She denies any blood in her stool or lightheadedness or dizziness.   Depression and anxiety Current rx-diazepam 5 mg nightly as needed # meds rx-30, does not use every day and that will last her a couple months Effectiveness of current meds-works well Adverse reactions form meds-none  Pill count performed-No Last drug screen -08/14/2020 ( high risk q44m moderate risk q685mlow risk yearly ) [place] drug [place] today- No [place]  If [place] were their any concerning findings? -None  No flowsheet data found.   Controlled substance contract signed on: 08/14/2020  Patient has been having a lot of significant right posterior hip pain in her lower back region is been bothering her more recently over the past month.  She says the weather changes are really not helping.  She did do injection before and did not notice any difference.  Relevant past medical, surgical, family and social history reviewed and updated as indicated. Interim medical history since our last visit reviewed. Allergies and medications reviewed and updated.  Review of Systems  Constitutional: Negative for chills and fever.  Eyes: Negative for visual disturbance.  Respiratory: Negative for chest tightness and  shortness of breath.   Cardiovascular: Negative for chest pain and leg swelling.  Musculoskeletal: Positive for arthralgias. Negative for back pain and gait problem.  Skin: Negative for rash.  Neurological: Negative for light-headedness and headaches.  Psychiatric/Behavioral: Negative for agitation and behavioral problems.  All other systems reviewed and are negative.   Per HPI unless specifically indicated above   Allergies as of 11/05/2020      Reactions   Actonel [risedronate] Nausea And Vomiting, Other (See Comments)   Throat tightness   Fosamax [alendronate Sodium] Other (See Comments)   esophagitis   Mevacor [lovastatin] Other (See Comments)   myalgias   Miacalcin [calcitonin (salmon)] Nausea And Vomiting   Mobic [meloxicam] Hives, Itching   rash      Medication List       Accurate as of November 05, 2020  1:44 PM. If you have any questions, ask your nurse or doctor.        albuterol 108 (90 Base) MCG/ACT inhaler Commonly known as: Ventolin HFA INHALE TWO PUFFS BY MOUTH 4 TIMES DAILY AS NEEDED   amoxicillin-clavulanate 875-125 MG tablet Commonly known as: AUGMENTIN Take 1 tablet by mouth in the morning and at bedtime.   aspirin EC 81 MG tablet Take 81 mg by mouth at bedtime.   Calcium Citrate-Vitamin D 315-250 MG-UNIT Tabs Commonly known as: Calcium Citrate + D Take 2 tablets by mouth daily.   cetirizine 10 MG tablet Commonly known as: ZYRTEC Take 5-10 mg by mouth at bedtime.   diazepam  5 MG tablet Commonly known as: VALIUM Take 1 tablet (5 mg total) by mouth at bedtime as needed.   diltiazem 300 MG 24 hr capsule Commonly known as: CARDIZEM CD TAKE 1 CAPSULE BY MOUTH EVERY DAY   esomeprazole 40 MG capsule Commonly known as: NEXIUM TAKE 1 CAPSULE BY MOUTH EVERY DAY   ferrous sulfate 325 (65 FE) MG tablet Take 1 tablet (325 mg total) by mouth at bedtime.   ipratropium-albuterol 0.5-2.5 (3) MG/3ML Soln Commonly known as: DUONEB Take 3 mLs by  nebulization every 6 (six) hours as needed. Dx:  Asthma 493.90   metoprolol tartrate 25 MG tablet Commonly known as: LOPRESSOR TAKE 1 TABLET (25 MG TOTAL) BY MOUTH DAILY AS NEEDED.   montelukast 10 MG tablet Commonly known as: SINGULAIR TAKE 1 TABLET BY MOUTH EVERYDAY AT BEDTIME   nitroGLYCERIN 0.4 MG/SPRAY spray Commonly known as: NITROLINGUAL ONE SPRAY UNDER TONGUE AS NEED FOR CHESTPAIN. MAY REPEAT IN 5 MINUTES IF PAIN NOT RELIEVED CALL 911.   PRESERVISION/LUTEIN PO Take 1 capsule by mouth at bedtime.   Prolia 60 MG/ML Sosy injection Generic drug: denosumab TO BE ADMINISTERED IN PHYSICIAN'S OFFICE. INJECT ONE SYRINGE SUBCOUSLY ONCE EVERY 6 MONTHS. REFRIGERATE. USE WITHIN 14 DAYS ONCE AT ROOM TEMPERATURE.   Restasis 0.05 % ophthalmic emulsion Generic drug: cycloSPORINE Place 1 drop into both eyes 2 (two) times daily.   simvastatin 10 MG tablet Commonly known as: ZOCOR TAKE 1 TABLET BY MOUTH EVERY DAY   Vitamin D 50 MCG (2000 UT) Caps Take 1 capsule by mouth at bedtime.        Objective:   BP 135/72   Pulse 78   Ht _0  (1.6 m)   Wt 175 lb (79.4 kg)   SpO2 95%   BMI 31.00 kg/m   Wt Readings from Last 3 Encounters:  11/05/20 175 lb (79.4 kg)  08/07/20 170 lb (77.1 kg)  05/03/20 170 lb (77.1 kg)    Physical Exam Vitals and nursing note reviewed.  Constitutional:      General: She is not in acute distress.    Appearance: She is well-developed. She is not diaphoretic.  Eyes:     Conjunctiva/sclera: Conjunctivae normal.  Cardiovascular:     Rate and Rhythm: Normal rate and regular rhythm.     Heart sounds: Normal heart sounds. No murmur heard.   Pulmonary:     Effort: Pulmonary effort is normal. No respiratory distress.     Breath sounds: Normal breath sounds. No wheezing.  Musculoskeletal:        General: Tenderness present.  Skin:    General: Skin is warm and dry.     Findings: No rash.  Neurological:     Mental Status: She is alert and oriented to  person, place, and time.     Coordination: Coordination normal.  Psychiatric:        Behavior: Behavior normal.       Assessment & Plan:   Problem List Items Addressed This Visit      Digestive   GERD   Relevant Medications   esomeprazole (NEXIUM) 40 MG capsule   Other Relevant Orders   CBC with Differential/Platelet   CMP14+EGFR     Other   Mixed hyperlipidemia - Primary   Relevant Medications   simvastatin (ZOCOR) 10 MG tablet   metoprolol tartrate (LOPRESSOR) 25 MG tablet   diltiazem (CARDIZEM CD) 300 MG 24 hr capsule   Other Relevant Orders   CMP14+EGFR   Lipid panel  Anxiety state   Relevant Medications   diazepam (VALIUM) 5 MG tablet   Depression, recurrent (HCC)   Relevant Medications   diazepam (VALIUM) 5 MG tablet    Other Visit Diagnoses    Chronic right SI joint pain       Relevant Orders   Ambulatory referral to Physical Therapy       Follow up plan: No follow-ups on file.  Counseling provided for all of the vaccine components No orders of the defined types were placed in this encounter.   Caryl Pina, MD [facility] 11/05/2020, 1:44 PM

## 2020-11-06 LAB — CBC WITH DIFFERENTIAL/PLATELET
Basophils Absolute: 0 10*3/uL (ref 0.0–0.2)
Basos: 1 %
EOS (ABSOLUTE): 0.2 10*3/uL (ref 0.0–0.4)
Eos: 4 %
Hematocrit: 41.5 % (ref 34.0–46.6)
Hemoglobin: 13.9 g/dL (ref 11.1–15.9)
Immature Grans (Abs): 0 10*3/uL (ref 0.0–0.1)
Immature Granulocytes: 0 %
Lymphocytes Absolute: 2 10*3/uL (ref 0.7–3.1)
Lymphs: 32 %
MCH: 30.5 pg (ref 26.6–33.0)
MCHC: 33.5 g/dL (ref 31.5–35.7)
MCV: 91 fL (ref 79–97)
Monocytes Absolute: 0.4 10*3/uL (ref 0.1–0.9)
Monocytes: 7 %
Neutrophils Absolute: 3.6 10*3/uL (ref 1.4–7.0)
Neutrophils: 56 %
Platelets: 191 10*3/uL (ref 150–450)
RBC: 4.56 x10E6/uL (ref 3.77–5.28)
RDW: 12.5 % (ref 11.7–15.4)
WBC: 6.3 10*3/uL (ref 3.4–10.8)

## 2020-11-06 LAB — CMP14+EGFR
ALT: 14 IU/L (ref 0–32)
AST: 20 IU/L (ref 0–40)
Albumin/Globulin Ratio: 2 (ref 1.2–2.2)
Albumin: 4.4 g/dL (ref 3.6–4.6)
Alkaline Phosphatase: 61 IU/L (ref 44–121)
BUN/Creatinine Ratio: 27 (ref 12–28)
BUN: 18 mg/dL (ref 8–27)
Bilirubin Total: 0.3 mg/dL (ref 0.0–1.2)
CO2: 24 mmol/L (ref 20–29)
Calcium: 9.5 mg/dL (ref 8.7–10.3)
Chloride: 101 mmol/L (ref 96–106)
Creatinine, Ser: 0.67 mg/dL (ref 0.57–1.00)
Globulin, Total: 2.2 g/dL (ref 1.5–4.5)
Glucose: 93 mg/dL (ref 65–99)
Potassium: 4.7 mmol/L (ref 3.5–5.2)
Sodium: 141 mmol/L (ref 134–144)
Total Protein: 6.6 g/dL (ref 6.0–8.5)
eGFR: 88 mL/min/{1.73_m2} (ref >59)

## 2020-11-06 LAB — LIPID PANEL
Chol/HDL Ratio: 4 ratio (ref 0.0–4.4)
Cholesterol, Total: 226 mg/dL — ABNORMAL HIGH (ref 100–199)
HDL: 57 mg/dL (ref >39)
LDL Chol Calc (NIH): 134 mg/dL — ABNORMAL HIGH (ref 0–99)
Triglycerides: 196 mg/dL — ABNORMAL HIGH (ref 0–149)
VLDL Cholesterol Cal: 35 mg/dL (ref 5–40)

## 2020-11-07 ENCOUNTER — Telehealth: Payer: Self-pay | Admitting: *Deleted

## 2020-11-07 NOTE — Telephone Encounter (Signed)
PA in process for diclofenac gel 1% Key: [id number] - PA Case ID: [id number] - Rx #: [id number]

## 2020-11-09 ENCOUNTER — Other Ambulatory Visit: Payer: Self-pay

## 2020-11-09 MED ORDER — SIMVASTATIN 20 MG PO TABS: 20.0000 mg | ORAL_TABLET | Freq: Every day | ORAL | 1 refills | Status: DC

## 2020-11-12 NOTE — Telephone Encounter (Signed)
Patient aware and verbalized understanding. °

## 2020-11-12 NOTE — Telephone Encounter (Signed)
Deniedon April 6 Your request has been denied

## 2020-11-12 NOTE — Telephone Encounter (Signed)
Please let her know that insurance denied and she will probably just have to buy over-the-counter

## 2020-11-14 ENCOUNTER — Encounter: Payer: Self-pay | Admitting: Physical Therapy

## 2020-11-14 ENCOUNTER — Other Ambulatory Visit: Payer: Self-pay

## 2020-11-14 ENCOUNTER — Ambulatory Visit: Payer: Medicare HMO | Attending: Family Medicine | Admitting: Physical Therapy

## 2020-11-14 DIAGNOSIS — R293 Abnormal posture: Secondary | ICD-10-CM

## 2020-11-14 DIAGNOSIS — G8929 Other chronic pain: Secondary | ICD-10-CM

## 2020-11-14 DIAGNOSIS — M6281 Muscle weakness (generalized): Secondary | ICD-10-CM

## 2020-11-14 DIAGNOSIS — M545 Low back pain, unspecified: Secondary | ICD-10-CM | POA: Insufficient documentation

## 2020-11-14 NOTE — Therapy (Signed)
[facility] [place], [place], [address] Phone: [phone]   Fax:  [phone]  Physical Therapy Evaluation  Patient Details  Name: Krystal [name] MRN: [id number] Date of Birth: 1941-03-08 Referring Provider (PT): Caryl Pina MD   Encounter Date: 11/14/2020   PT End of Session - 11/14/20 1761    Visit Number 1    Number of Visits 12    Date for PT Re-Evaluation 02/12/21    Authorization Type FOTO AT LEAST EVERY 5TH VISIT.  PROGRESS NOTE AT 10TH VISIT.  KX MODIFIER AFTER 15 VISITS.    PT Start Time 0108    PT Stop Time 0149    PT Time Calculation (min) 41 min    Activity Tolerance Patient tolerated treatment well    Behavior During Therapy [facility] for tasks assessed/performed           Past Medical History:  Diagnosis Date  . Anemia   . Asthma   . Brain tumor ([place])    1986  . Breast cancer ([place])    Right breast  . Cataract   . Colon polyps   . COPD (chronic obstructive pulmonary disease) ([place])   . Hiatal hernia   . Hyperlipidemia   . Leg edema   . Macular degeneration   . Osteoporosis   . Pneumonia   . PVD (posterior vitreous detachment)   . Right knee pain   . Shingles rash     Past Surgical History:  Procedure Laterality Date  . CATARACT EXTRACTION, BILATERAL    . COLONOSCOPY    . CRANIECTOMY / CRANIOTOMY FOR EXCISION OF BRAIN TUMOR     Left side brain  . MASTECTOMY Right   . PARTIAL HYSTERECTOMY    . POLYPECTOMY    . SKIN CANCER EXCISION Left    Under left eye    There were no vitals filed for this visit.    Subjective Assessment - 11/14/20 1521    Subjective COVID-19 screen performed prior to patient entering clinic.  The patient presents to the clinic today with c/o chronic low back pain with her CC on the right side today.  She feels that she has declined since the [place] as she didn't get out and about as much.  She is now using a Rollator for safer ambulation.  She rates  her pain at an 8/10 with pain radiating into her right buttock.  She also reports her sleep is disturbed due to pain.  She cannot determine anything in particular that increases or decreases her pain.    Pertinent History OP, OA.    How long can you walk comfortably? Short community distance with Rollator.    Patient Stated Goals Decrease pain and walk more.    Currently in Pain? Yes    Pain Score 8     Pain Location Back    Pain Orientation Right    Pain Descriptors / Indicators Aching    Pain Type Chronic pain    Pain Onset More than a month ago    Pain Frequency Constant    Aggravating Factors  See above.    Pain Relieving Factors See above.              [facility] PT Assessment - 11/14/20 0001      Assessment   Medical Diagnosis Chronic right SIJ pain.    Referring Provider (PT) Caryl Pina MD    Onset Date/Surgical Date --   Ongoing.  Precautions   Precautions Fall    Precaution Comments OP.  No spinal loading.      Restrictions   Weight Bearing Restrictions No      Balance Screen   Has the patient fallen in the past 6 months No    Has the patient had a decrease in activity level because of a fear of falling?  Yes    Is the patient reluctant to leave their home because of a fear of falling?  Yes      [place] residence      Prior Function   Level of Independence Independent      Posture/Postural Control   Posture/Postural Control Postural limitations    Postural Limitations Rounded Shoulders;Forward head;Increased thoracic kyphosis;Decreased lumbar lordosis;Flexed trunk    Posture Comments Scoliosis with some right convexity.      Deep Tendon Reflexes   DTR Assessment Site Patella;Achilles    Patella DTR 1+    Achilles DTR 1+      ROM / Strength   AROM / PROM / Strength AROM;Strength      AROM   Overall AROM Comments Functional LE movement.      Strength   Overall Strength Comments Bilateral hip flex/abd is  4-5, bilateral knee strength is 4/5.      Palpation   Palpation comment Very tender and taut to palpation over right lumbar musculature, right SIJ and upper gluteal region is tender as well with radiation of pain into patient's right buttock.      Special Tests   Other special tests Right LE slight shorter than left and corrected via a reverse muscle energy technique.  (-) SLR and FABER testing.      Ambulation/Gait   Gait Comments Slow and purposeful with a Rollator.                      Objective measurements completed on examination: See above findings.       [facility] Adult PT Treatment/Exercise - 11/14/20 0001      Modalities   Modalities Electrical Stimulation;Moist Heat      Moist Heat Therapy   Number Minutes Moist Heat 15 Minutes    Moist Heat Location Lumbar Spine      Electrical Stimulation   Electrical Stimulation Location Right low back.    Electrical Stimulation Action IFC at 40% scan    Electrical Stimulation Parameters 80-150 Hz. x 15 minutes.    Electrical Stimulation Goals Pain;Tone                       PT Long Term Goals - 11/14/20 1554      PT LONG TERM GOAL #1   Title Independent with a HEP.    Time 6    Period Weeks    Status New      PT LONG TERM GOAL #2   Title Walk 500 feet with Rollator with pain-level not exceeding 3-4/10.    Time 6    Period Weeks    Status New      PT LONG TERM GOAL #3   Title Perform ADL's with pain not > 3-4/10.    Time 6    Period Weeks    Status New                  Plan - 11/14/20 1518    Clinical Impression Statement The patient presents to OPPT with c/o  right sided low back pain.  The pain has affected her quality of life and she can not move as easy as she did previosly.  She uses a Rollator for safe ambulation.  She exhibits bilateral LE weakness and a decrease in fucntional mobility.  she has multiple postural abnormalites as well.  Her right lumbar musculature exhibits a  great deal of tone and tenderness as does her right SIJ and upper gluteal region.Patient will benefit from skilled physical therapy intervention to address pain and deficits.    Personal Factors and Comorbidities Comorbidity 1;Other    Comorbidities OP, OA.    Examination-Activity Limitations Other;Locomotion Level    Examination-Participation Restrictions Other    Stability/Clinical Decision Making Evolving/Moderate complexity    Clinical Decision Making Low    Rehab Potential Good    PT Frequency 2x / week    PT Duration 6 weeks    PT Treatment/Interventions ADLs/Self Care Home Management;Cryotherapy;Electrical Stimulation;Ultrasound;Moist Heat;Functional mobility training;Therapeutic activities;Therapeutic exercise;Manual techniques;Patient/family education;Passive range of motion    PT Next Visit Plan FOTO...Marland KitchenMarland KitchenModalites and STW/M to patient's right low back.  Slow core exercise progression.    Consulted and Agree with Plan of Care Patient           Patient will benefit from skilled therapeutic intervention in order to improve the following deficits and impairments:  Pain,Decreased activity tolerance,Postural dysfunction,Decreased strength,Increased muscle spasms  Visit Diagnosis: Chronic right-sided low back pain without sciatica - Plan: PT plan of care cert/re-cert  Abnormal posture - Plan: PT plan of care cert/re-cert  Muscle weakness (generalized) - Plan: PT plan of care cert/re-cert     Problem List Patient Active Problem List   Diagnosis Date Noted  . SOB (shortness of breath) 05/17/2019  . Osteoporosis 03/24/2019  . Chest pain of uncertain etiology 83/38/2505  . Carpal tunnel syndrome of right wrist 03/25/2018  . Influenza with pneumonia 10/30/2015  . Abdominal aortic atherosclerosis ([place]) 04/10/2015  . PSVT (paroxysmal supraventricular tachycardia) ([place]) 05/10/2014  . Macular degeneration, age related, nonexudative 08/24/2013  . History of right mastectomy  07/13/2013  . Anemia, iron deficiency 06/21/2013  . Allergic rhinitis 06/21/2013  . Chronic low back pain 06/21/2013  . Malignant neoplasm of female breast ([place]) 11/22/2007  . Mixed hyperlipidemia 11/22/2007  . Anxiety state 11/22/2007  . Depression, recurrent ([place]) 11/22/2007  . Asthma 11/22/2007  . GERD 11/22/2007  . DUODENITIS, WITHOUT HEMORRHAGE 08/14/2007  . HIATAL HERNIA 08/14/2007  . 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    Lolita Faulds, [place] MPT 11/14/2020, 3:56 PM  [facility] [place], [place], [address] Phone: [phone]   Fax:  [phone]  Name: Krystal [name] MRN: [id number] Date of Birth: [birth year]

## 2020-11-20 ENCOUNTER — Encounter: Payer: Medicare HMO | Admitting: Physical Therapy

## 2020-11-22 ENCOUNTER — Other Ambulatory Visit: Payer: Self-pay

## 2020-11-22 ENCOUNTER — Encounter: Payer: Self-pay | Admitting: Physical Therapy

## 2020-11-22 ENCOUNTER — Ambulatory Visit: Payer: Medicare HMO | Admitting: Physical Therapy

## 2020-11-22 DIAGNOSIS — R293 Abnormal posture: Secondary | ICD-10-CM

## 2020-11-22 DIAGNOSIS — M6281 Muscle weakness (generalized): Secondary | ICD-10-CM

## 2020-11-22 DIAGNOSIS — G8929 Other chronic pain: Secondary | ICD-10-CM | POA: Diagnosis not present

## 2020-11-22 DIAGNOSIS — M545 Low back pain, unspecified: Secondary | ICD-10-CM | POA: Diagnosis not present

## 2020-11-22 NOTE — Therapy (Signed)
[facility] [place], [place], [address] Phone: [phone]   Fax:  [phone]  Physical Therapy Treatment  Patient Details  Name: Krystal [name] MRN: [id number] Date of Birth: 1941-04-19 Referring Provider (PT): Caryl Pina MD   Encounter Date: 11/22/2020   PT End of Session - 11/22/20 1523    Visit Number 2    Number of Visits 12    Date for PT Re-Evaluation 02/12/21    Authorization Type FOTO AT LEAST EVERY 5TH VISIT.  PROGRESS NOTE AT 10TH VISIT.  KX MODIFIER AFTER 15 VISITS.    PT Start Time 1435    PT Stop Time 1515    PT Time Calculation (min) 40 min    Equipment Utilized During Treatment Other (comment)   Rollator   Activity Tolerance Patient tolerated treatment well    Behavior During Therapy WFL for tasks assessed/performed           Past Medical History:  Diagnosis Date  . Anemia   . Asthma   . Brain tumor ([place])    1986  . Breast cancer ([place])    Right breast  . Cataract   . Colon polyps   . COPD (chronic obstructive pulmonary disease) ([place])   . Hiatal hernia   . Hyperlipidemia   . Leg edema   . Macular degeneration   . Osteoporosis   . Pneumonia   . PVD (posterior vitreous detachment)   . Right knee pain   . Shingles rash     Past Surgical History:  Procedure Laterality Date  . CATARACT EXTRACTION, BILATERAL    . COLONOSCOPY    . CRANIECTOMY / CRANIOTOMY FOR EXCISION OF BRAIN TUMOR     Left side brain  . MASTECTOMY Right   . PARTIAL HYSTERECTOMY    . POLYPECTOMY    . SKIN CANCER EXCISION Left    Under left eye    There were no vitals filed for this visit.   Subjective Assessment - 11/22/20 1435    Subjective COVID-19 screen performed prior to patient entering clinic. Hurting in low back and R hip area.    Pertinent History OP, OA.    How long can you walk comfortably? Short community distance with Rollator.    Patient Stated Goals Decrease pain and walk more.     Currently in Pain? Yes    Pain Score 7     Pain Location Back    Pain Orientation Right;Lower    Pain Descriptors / Indicators Discomfort    Pain Type Chronic pain    Pain Onset More than a month ago    Pain Frequency Constant              [facility] PT Assessment - 11/22/20 0001      Assessment   Medical Diagnosis Chronic right SIJ pain.    Referring Provider (PT) Caryl Pina MD      Precautions   Precautions Fall    Precaution Comments OP.  No spinal loading.      Restrictions   Weight Bearing Restrictions No                         [facility] Adult PT Treatment/Exercise - 11/22/20 0001      Exercises   Exercises Knee/Hip      Knee/Hip Exercises: Stretches   Passive Hamstring Stretch Right;3 reps;30 seconds   seated   Other Knee/Hip Stretches R figure 4 stretch 3x30 sec  Other Knee/Hip Stretches R SKTC 3x30 sec      Knee/Hip Exercises: Aerobic   Nustep L3 x18 min      Knee/Hip Exercises: Seated   Long Arc Quad Strengthening;Both;3 sets;10 reps    Long Arc Quad Weight 3 lbs.    Clamshell with TheraBand Yellow   x20 reps   Other Seated Knee/Hip Exercises seated ab bracing x10 reps 5 sec holds    Marching AROM;Both;2 sets;10 reps                       PT Long Term Goals - 11/14/20 1554      PT LONG TERM GOAL #1   Title Independent with a HEP.    Time 6    Period Weeks    Status New      PT LONG TERM GOAL #2   Title Walk 500 feet with Rollator with pain-level not exceeding 3-4/10.    Time 6    Period Weeks    Status New      PT LONG TERM GOAL #3   Title Perform ADL's with pain not > 3-4/10.    Time 6    Period Weeks    Status New                 Plan - 11/22/20 1618    Clinical Impression Statement Patient presented in clinic with R LBP and hip pain. Patient able to tolerate therex with no reports of increased pain. Patient observed with sit <> stand transfer with greater effort required by UE due to LE weakness.  Patient interested in home aerobic unit in the floor to contine exercising. Patient reports decline in activity since the COVID pandemic. Patient also guided through seated RLE stretching for her R hip and low back musculature.    Personal Factors and Comorbidities Comorbidity 1;Other    Comorbidities OP, OA.    Examination-Activity Limitations Other;Locomotion Level    Examination-Participation Restrictions Other    Stability/Clinical Decision Making Evolving/Moderate complexity    Rehab Potential Good    PT Frequency 2x / week    PT Duration 6 weeks    PT Treatment/Interventions ADLs/Self Care Home Management;Cryotherapy;Electrical Stimulation;Ultrasound;Moist Heat;Functional mobility training;Therapeutic activities;Therapeutic exercise;Manual techniques;Patient/family education;Passive range of motion    PT Next Visit Plan FOTO...Marland KitchenMarland KitchenModalites and STW/M to patient's right low back.  Slow core exercise progression.    Consulted and Agree with Plan of Care Patient           Patient will benefit from skilled therapeutic intervention in order to improve the following deficits and impairments:  Pain,Decreased activity tolerance,Postural dysfunction,Decreased strength,Increased muscle spasms  Visit Diagnosis: Chronic right-sided low back pain without sciatica  Abnormal posture  Muscle weakness (generalized)     Problem List Patient Active Problem List   Diagnosis Date Noted  . SOB (shortness of breath) 05/17/2019  . Osteoporosis 03/24/2019  . Chest pain of uncertain etiology 95/62/1308  . Carpal tunnel syndrome of right wrist 03/25/2018  . Influenza with pneumonia 10/30/2015  . Abdominal aortic atherosclerosis ([place]) 04/10/2015  . PSVT (paroxysmal supraventricular tachycardia) ([place]) 05/10/2014  . Macular degeneration, age related, nonexudative 08/24/2013  . History of right mastectomy 07/13/2013  . Anemia, iron deficiency 06/21/2013  . Allergic rhinitis 06/21/2013  . Chronic low  back pain 06/21/2013  . Malignant neoplasm of female breast ([place]) 11/22/2007  . Mixed hyperlipidemia 11/22/2007  . Anxiety state 11/22/2007  . Depression, recurrent ([place]) 11/22/2007  . Asthma 11/22/2007  .  GERD 11/22/2007  . DUODENITIS, WITHOUT HEMORRHAGE 08/14/2007  . HIATAL HERNIA 08/14/2007  . 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    Standley Brooking, PTA 11/22/2020, 4:22 PM  [facility] [address] [place], [place], [address] Phone: [phone]   Fax:  [phone]  Name: BAYAN HEDSTROM MRN: [id number] Date of Birth: 05/20/1941

## 2020-11-28 ENCOUNTER — Other Ambulatory Visit: Payer: Self-pay

## 2020-11-28 ENCOUNTER — Ambulatory Visit: Payer: Medicare HMO | Admitting: Physical Therapy

## 2020-11-28 ENCOUNTER — Encounter: Payer: Self-pay | Admitting: Physical Therapy

## 2020-11-28 DIAGNOSIS — R293 Abnormal posture: Secondary | ICD-10-CM

## 2020-11-28 DIAGNOSIS — G8929 Other chronic pain: Secondary | ICD-10-CM

## 2020-11-28 DIAGNOSIS — M6281 Muscle weakness (generalized): Secondary | ICD-10-CM | POA: Diagnosis not present

## 2020-11-28 DIAGNOSIS — M545 Low back pain, unspecified: Secondary | ICD-10-CM

## 2020-11-28 NOTE — Therapy (Signed)
[facility] [place], [place], [address] Phone: [phone]   Fax:  [phone]  Physical Therapy Treatment  Patient Details  Name: Krystal [name] MRN: [id number] Date of Birth: [birth year] Referring Provider (PT): Caryl Pina MD   Encounter Date: 11/28/2020   PT End of Session - 11/28/20 1248    Visit Number 3    Number of Visits 12    Date for PT Re-Evaluation 02/12/21    Authorization Type FOTO AT LEAST EVERY 5TH VISIT.  PROGRESS NOTE AT 10TH VISIT.  KX MODIFIER AFTER 15 VISITS.    PT Start Time 1300    PT Stop Time 1358    PT Time Calculation (min) 58 min    Activity Tolerance Patient tolerated treatment well;Patient limited by pain    Behavior During Therapy [facility] for tasks assessed/performed           Past Medical History:  Diagnosis Date  . Anemia   . Asthma   . Brain tumor ([place])    1986  . Breast cancer ([place])    Right breast  . Cataract   . Colon polyps   . COPD (chronic obstructive pulmonary disease) ([place])   . Hiatal hernia   . Hyperlipidemia   . Leg edema   . Macular degeneration   . Osteoporosis   . Pneumonia   . PVD (posterior vitreous detachment)   . Right knee pain   . Shingles rash     Past Surgical History:  Procedure Laterality Date  . CATARACT EXTRACTION, BILATERAL    . COLONOSCOPY    . CRANIECTOMY / CRANIOTOMY FOR EXCISION OF BRAIN TUMOR     Left side brain  . MASTECTOMY Right   . PARTIAL HYSTERECTOMY    . POLYPECTOMY    . SKIN CANCER EXCISION Left    Under left eye    There were no vitals filed for this visit.   Subjective Assessment - 11/28/20 1306    Subjective COVID-19 screen performed prior to patient entering clinic. Hurting in low back and R hip area. Pain is about the same.    Pertinent History OP, OA.    How long can you walk comfortably? Short community distance with Rollator.    Patient Stated Goals Decrease pain and walk more.    Currently in Pain? Yes     Pain Score 7     Pain Location Back    Pain Orientation Right;Lower    Pain Descriptors / Indicators Discomfort                             [facility] Adult PT Treatment/Exercise - 11/28/20 0001      Knee/Hip Exercises: Aerobic   Nustep L3 x10 min      Knee/Hip Exercises: Seated   Sit to Sand --   review correct technique to avoid trunk flexion     Knee/Hip Exercises: Supine   Bridges 10 reps    Bridges Limitations very small range    Other Supine Knee/Hip Exercises clams with yellow 2 x 10; tried red; too difficult      Knee/Hip Exercises: Prone   Other Prone Exercises prone lying and prone on elbows: no change in sx      Modalities   Modalities Electrical Stimulation;Moist Heat      Moist Heat Therapy   Number Minutes Moist Heat 15 Minutes    Moist Heat Location Lumbar Spine  Acupuncturist Location right lumbar/gluts    Chartered certified accountant IfC    Electrical Stimulation Parameters 80-150 Hz x 15 min    Electrical Stimulation Goals Pain;Tone      Manual Therapy   Manual Therapy Joint mobilization;Myofascial release;Muscle Energy Technique    Joint Mobilization to bil lumbar UPA Gd II/III for pain and decreasing muscle tension    Myofascial Release TPR to right QL, gluteals and iliopsoas with active hip flexion x 10    Muscle Energy Technique for post rotated right inominate                       PT Long Term Goals - 11/14/20 1554      PT LONG TERM GOAL #1   Title Independent with a HEP.    Time 6    Period Weeks    Status New      PT LONG TERM GOAL #2   Title Walk 500 feet with Rollator with pain-level not exceeding 3-4/10.    Time 6    Period Weeks    Status New      PT LONG TERM GOAL #3   Title Perform ADL's with pain not > 3-4/10.    Time 6    Period Weeks    Status New                 Plan - 11/28/20 1447    Clinical Impression Statement Patient with continued  reports of 6/10 right low back and hip pain. Positioning did not change sx. She is very tight in right QL/gluteals and iliopsoas and would benefit from DN/manual therapy here as well as stretches. MET did not fully correct leg length discrepancy and in hooklying pt's left femur is longer. Normal respose to estim/heat.    PT Frequency 2x / week    PT Duration 6 weeks    PT Treatment/Interventions ADLs/Self Care Home Management;Cryotherapy;Electrical Stimulation;Ultrasound;Moist Heat;Functional mobility training;Therapeutic activities;Therapeutic exercise;Manual techniques;Patient/family education;Passive range of motion    PT Next Visit Plan Add HEP Stretches for psoas, QL and piriforis. Modalites and STW/M to patient's right low back.  Slow core exercise progression.    Consulted and Agree with Plan of Care Patient           Patient will benefit from skilled therapeutic intervention in order to improve the following deficits and impairments:  Pain,Decreased activity tolerance,Postural dysfunction,Decreased strength,Increased muscle spasms  Visit Diagnosis: Chronic right-sided low back pain without sciatica  Abnormal posture  Muscle weakness (generalized)     Problem List Patient Active Problem List   Diagnosis Date Noted  . SOB (shortness of breath) 05/17/2019  . Osteoporosis 03/24/2019  . Chest pain of uncertain etiology 52/77/8242  . Carpal tunnel syndrome of right wrist 03/25/2018  . Influenza with pneumonia 10/30/2015  . Abdominal aortic atherosclerosis ([place]) 04/10/2015  . PSVT (paroxysmal supraventricular tachycardia) ([place]) 05/10/2014  . Macular degeneration, age related, nonexudative 08/24/2013  . History of right mastectomy 07/13/2013  . Anemia, iron deficiency 06/21/2013  . Allergic rhinitis 06/21/2013  . Chronic low back pain 06/21/2013  . Malignant neoplasm of female breast ([place]) 11/22/2007  . Mixed hyperlipidemia 11/22/2007  . Anxiety state 11/22/2007  .  Depression, recurrent ([place]) 11/22/2007  . Asthma 11/22/2007  . GERD 11/22/2007  . DUODENITIS, WITHOUT HEMORRHAGE 08/14/2007  . HIATAL HERNIA 08/14/2007  . 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    Madelyn Flavors PT 11/28/2020, 2:53 PM  [facility] [place], [place], [address] Phone: [phone]   Fax:  [phone]  Name: MACARIA BIAS MRN: [id number] Date of Birth: 1940/12/12

## 2020-12-05 ENCOUNTER — Ambulatory Visit: Payer: Medicare HMO | Attending: Family Medicine | Admitting: Physical Therapy

## 2020-12-05 ENCOUNTER — Other Ambulatory Visit: Payer: Self-pay

## 2020-12-05 DIAGNOSIS — R293 Abnormal posture: Secondary | ICD-10-CM | POA: Diagnosis not present

## 2020-12-05 DIAGNOSIS — G8929 Other chronic pain: Secondary | ICD-10-CM | POA: Diagnosis not present

## 2020-12-05 DIAGNOSIS — M6281 Muscle weakness (generalized): Secondary | ICD-10-CM | POA: Insufficient documentation

## 2020-12-05 DIAGNOSIS — M545 Low back pain, unspecified: Secondary | ICD-10-CM | POA: Diagnosis not present

## 2020-12-05 NOTE — Patient Instructions (Signed)
   Lift right leg to chest hold 20-30 sec perform 3reps 1x day

## 2020-12-05 NOTE — Therapy (Signed)
[facility] [place], [place], [address] Phone: [phone]   Fax:  [phone]  Physical Therapy Treatment  Patient Details  Name: Krystal [name] MRN: [id number] Date of Birth: 1940-11-03 Referring Provider (PT): Caryl Pina MD   Encounter Date: 12/05/2020   PT End of Session - 12/05/20 1303    Visit Number 4    Number of Visits 12    Date for PT Re-Evaluation 02/12/21    Authorization Type FOTO AT LEAST EVERY 5TH VISIT.  PROGRESS NOTE AT 10TH VISIT.  KX MODIFIER AFTER 15 VISITS.    PT Start Time 0100    PT Stop Time 0144    PT Time Calculation (min) 44 min    Activity Tolerance Patient tolerated treatment well;Patient limited by pain    Behavior During Therapy [facility] for tasks assessed/performed           Past Medical History:  Diagnosis Date  . Anemia   . Asthma   . Brain tumor ([place])    1986  . Breast cancer ([place])    Right breast  . Cataract   . Colon polyps   . COPD (chronic obstructive pulmonary disease) ([place])   . Hiatal hernia   . Hyperlipidemia   . Leg edema   . Macular degeneration   . Osteoporosis   . Pneumonia   . PVD (posterior vitreous detachment)   . Right knee pain   . Shingles rash     Past Surgical History:  Procedure Laterality Date  . CATARACT EXTRACTION, BILATERAL    . COLONOSCOPY    . CRANIECTOMY / CRANIOTOMY FOR EXCISION OF BRAIN TUMOR     Left side brain  . MASTECTOMY Right   . PARTIAL HYSTERECTOMY    . POLYPECTOMY    . SKIN CANCER EXCISION Left    Under left eye    There were no vitals filed for this visit.   Subjective Assessment - 12/05/20 1258    Subjective COVID-19 screen performed prior to patient entering clinic. Hurting in low back and R hip area. Patient arrived with ongoing pain per reported.    Pertinent History OP, OA.    How long can you walk comfortably? Short community distance with Rollator.    Patient Stated Goals Decrease pain and walk more.     Currently in Pain? Yes    Pain Score 6     Pain Location Back    Pain Orientation Right;Lower    Pain Descriptors / Indicators Discomfort    Pain Type Chronic pain    Pain Onset More than a month ago    Pain Frequency Constant    Aggravating Factors  certain movements or activity    Pain Relieving Factors at rest                             [facility] Adult PT Treatment/Exercise - 12/05/20 0001      Exercises   Exercises Knee/Hip;Lumbar      Lumbar Exercises: Supine   Clam 20 reps    Clam Limitations red band    Straight Leg Raise 3 seconds   2x10 right LE   Isometric Hip Flexion 10 reps;5 seconds    Other Supine Lumbar Exercises trunk rotation 5sec x 5reps each side    Other Supine Lumbar Exercises ball squeeze 35min 5-10 sec holds      Knee/Hip Exercises: Stretches   Hip Flexor Stretch Right;2 reps;20  seconds   standing   Other Knee/Hip Stretches R figure 4 stretch 3x30 sec      Knee/Hip Exercises: Aerobic   Nustep L4 x10 min UE/LE      Knee/Hip Exercises: Seated   Long Arc Quad Strengthening;Both;3 sets;10 reps    Long Arc Quad Weight 3 lbs.    Hamstring Curl Strengthening;Right;20 reps    Hamstring Limitations yellow band      Moist Heat Therapy   Number Minutes Moist Heat 10 Minutes    Moist Heat Location Lumbar Spine      Electrical Stimulation   Electrical Stimulation Location right lumbar/gluts    Electrical Stimulation Action IFC    Electrical Stimulation Parameters 80-150hz  x76min    Electrical Stimulation Goals Pain;Tone                  PT Education - 12/05/20 1347    Education Details HEP stretch    Person(s) Educated Patient    Methods Explanation;Demonstration;Handout    Comprehension Verbalized understanding;Returned demonstration               PT Long Term Goals - 12/05/20 1304      PT LONG TERM GOAL #1   Title Independent with a HEP.    Time 6    Period Weeks    Status On-going      PT LONG TERM GOAL #2    Title Walk 500 feet with Rollator with pain-level not exceeding 3-4/10.    Baseline 6/10 currently 12/05/20    Time 6    Period Weeks    Status On-going      PT LONG TERM GOAL #3   Title Perform ADL's with pain not > 3-4/10.    Baseline 6/10 currently 12/05/20    Time 6    Period Weeks    Status On-going                 Plan - 12/05/20 1341    Clinical Impression Statement Patient tolerated well today. Today focused on stretch and strength progression. Good response today. Patient continues to have limitations with ADL's due to right SIjoint issues. HEP provided.  Goals progressing today.    Personal Factors and Comorbidities Comorbidity 1;Other    Comorbidities OP, OA.    Examination-Activity Limitations Other;Locomotion Level    Examination-Participation Restrictions Other    Stability/Clinical Decision Making Evolving/Moderate complexity    Rehab Potential Good    PT Frequency 2x / week    PT Duration 6 weeks    PT Treatment/Interventions ADLs/Self Care Home Management;Cryotherapy;Electrical Stimulation;Ultrasound;Moist Heat;Functional mobility training;Therapeutic activities;Therapeutic exercise;Manual techniques;Patient/family education;Passive range of motion    PT Next Visit Plan Add HEP Stretches QL and piriforis. Modalites and STW/M to patient's right low back.  Slow core exercise progression.    Consulted and Agree with Plan of Care Patient           Patient will benefit from skilled therapeutic intervention in order to improve the following deficits and impairments:  Pain,Decreased activity tolerance,Postural dysfunction,Decreased strength,Increased muscle spasms  Visit Diagnosis: Chronic right-sided low back pain without sciatica  Abnormal posture  Muscle weakness (generalized)     Problem List Patient Active Problem List   Diagnosis Date Noted  . SOB (shortness of breath) 05/17/2019  . Osteoporosis 03/24/2019  . Chest pain of uncertain etiology  62/83/1517  . Carpal tunnel syndrome of right wrist 03/25/2018  . Influenza with pneumonia 10/30/2015  . Abdominal aortic atherosclerosis ([place]) 04/10/2015  . PSVT (  paroxysmal supraventricular tachycardia) ([place]) 05/10/2014  . Macular degeneration, age related, nonexudative 08/24/2013  . History of right mastectomy 07/13/2013  . Anemia, iron deficiency 06/21/2013  . Allergic rhinitis 06/21/2013  . Chronic low back pain 06/21/2013  . Malignant neoplasm of female breast ([place]) 11/22/2007  . Mixed hyperlipidemia 11/22/2007  . Anxiety state 11/22/2007  . Depression, recurrent ([place]) 11/22/2007  . Asthma 11/22/2007  . GERD 11/22/2007  . DUODENITIS, WITHOUT HEMORRHAGE 08/14/2007  . HIATAL HERNIA 08/14/2007  . 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    Torah Pinnock P, PTA 12/05/2020, 1:52 PM  [facility] [place], [place], [address] Phone: [phone]   Fax:  [phone]  Name: JOELI FENNER MRN: [id number] Date of Birth: 11/29/1940

## 2020-12-12 ENCOUNTER — Ambulatory Visit: Payer: Medicare HMO | Admitting: Physical Therapy

## 2020-12-12 ENCOUNTER — Other Ambulatory Visit: Payer: Self-pay

## 2020-12-12 DIAGNOSIS — R293 Abnormal posture: Secondary | ICD-10-CM

## 2020-12-12 DIAGNOSIS — M545 Low back pain, unspecified: Secondary | ICD-10-CM

## 2020-12-12 DIAGNOSIS — G8929 Other chronic pain: Secondary | ICD-10-CM | POA: Diagnosis not present

## 2020-12-12 DIAGNOSIS — M6281 Muscle weakness (generalized): Secondary | ICD-10-CM | POA: Diagnosis not present

## 2020-12-12 NOTE — Therapy (Signed)
[facility] [place], [place], [address] Phone: [phone]   Fax:  [phone]  Physical Therapy Treatment  Patient Details  Name: Krystal [name] MRN: [id number] Date of Birth: Jun 02, 1941 Referring Provider (PT): Caryl Pina MD   Encounter Date: 12/12/2020   PT End of Session - 12/12/20 1336    Visit Number 5    Number of Visits 12    Date for PT Re-Evaluation 02/12/21    Authorization Type FOTO AT LEAST EVERY 5TH VISIT.  PROGRESS NOTE AT 10TH VISIT.  KX MODIFIER AFTER 15 VISITS.    PT Start Time 0100    PT Stop Time 0143    PT Time Calculation (min) 43 min    Activity Tolerance Patient tolerated treatment well;Patient limited by pain    Behavior During Therapy [facility] for tasks assessed/performed           Past Medical History:  Diagnosis Date  . Anemia   . Asthma   . Brain tumor ([place])    1986  . Breast cancer ([place])    Right breast  . Cataract   . Colon polyps   . COPD (chronic obstructive pulmonary disease) ([place])   . Hiatal hernia   . Hyperlipidemia   . Leg edema   . Macular degeneration   . Osteoporosis   . Pneumonia   . PVD (posterior vitreous detachment)   . Right knee pain   . Shingles rash     Past Surgical History:  Procedure Laterality Date  . CATARACT EXTRACTION, BILATERAL    . COLONOSCOPY    . CRANIECTOMY / CRANIOTOMY FOR EXCISION OF BRAIN TUMOR     Left side brain  . MASTECTOMY Right   . PARTIAL HYSTERECTOMY    . POLYPECTOMY    . SKIN CANCER EXCISION Left    Under left eye    There were no vitals filed for this visit.   Subjective Assessment - 12/12/20 1256    Subjective COVID-19 screen performed prior to patient entering clinic. Patient reported some ongoing discomfort.    Pertinent History OP, OA.    How long can you walk comfortably? Short community distance with Rollator.    Patient Stated Goals Decrease pain and walk more.    Currently in Pain? Yes    Pain Score 6      Pain Location Back    Pain Orientation Right;Lower    Pain Descriptors / Indicators Discomfort    Pain Type Chronic pain    Pain Onset More than a month ago    Pain Frequency Constant    Aggravating Factors  prolong activity    Pain Relieving Factors rest                             [facility] Adult PT Treatment/Exercise - 12/12/20 0001      Lumbar Exercises: Stretches   Hip Flexor Stretch Right;3 reps;30 seconds      Lumbar Exercises: Supine   Clam 20 reps;10 reps    Clam Limitations red band    Straight Leg Raise 3 seconds   2x10   Other Supine Lumbar Exercises trunk rotation 5sec x 5reps each side    Other Supine Lumbar Exercises ball squeeze x30sec 3sec holds      Lumbar Exercises: Sidelying   Hip Abduction Right;20 reps      Knee/Hip Exercises: Stretches   Other Knee/Hip Stretches R SKTC 3x30 sec  Knee/Hip Exercises: Aerobic   Nustep L4 x15 min UE/LE      Knee/Hip Exercises: Supine   Bridges Both;15 reps   half range     Moist Heat Therapy   Number Minutes Moist Heat 10 Minutes    Moist Heat Location Lumbar Spine      Electrical Stimulation   Electrical Stimulation Location Rt SI joint    Electrical Stimulation Action IFC    Electrical Stimulation Parameters 80-150hz  x36min    Electrical Stimulation Goals Pain;Tone                  PT Education - 12/12/20 1313    Education Details HEP progression    Person(s) Educated Patient    Methods Explanation;Demonstration;Handout    Comprehension Verbalized understanding;Returned demonstration               PT Long Term Goals - 12/12/20 1257      PT LONG TERM GOAL #1   Title Independent with a HEP.    Baseline 12/12/20    Time 6    Period Weeks    Status Achieved      PT LONG TERM GOAL #2   Title Walk 500 feet with Rollator with pain-level not exceeding 3-4/10.    Baseline 6/10 currently 12/12/20    Time 6    Period Weeks    Status On-going      PT LONG TERM GOAL #3    Title Perform ADL's with pain not > 3-4/10.    Baseline 6/10 currently 12/12/20    Time 6    Period Weeks    Status On-going                 Plan - 12/12/20 1316    Clinical Impression Statement Patient tolerated treatment well today. Patient continues to have pain in right SI joint per reported at 6/10 pain. Patient able to progress with exercises with HEP progression. Patient limited with ADL's and prolong activity. Good response today. LTG #1 met with remaining ongoing due to pain limitations.    Personal Factors and Comorbidities Comorbidity 1;Other    Comorbidities OP, OA.    Examination-Activity Limitations Other;Locomotion Level    Examination-Participation Restrictions Other    Stability/Clinical Decision Making Evolving/Moderate complexity    Rehab Potential Good    PT Frequency 2x / week    PT Duration 6 weeks    PT Treatment/Interventions ADLs/Self Care Home Management;Cryotherapy;Electrical Stimulation;Ultrasound;Moist Heat;Functional mobility training;Therapeutic activities;Therapeutic exercise;Manual techniques;Patient/family education;Passive range of motion    PT Next Visit Plan con with POC for progression. Modalites and STW/M to patient's right low back.  Slow core exercise progression.    Consulted and Agree with Plan of Care Patient           Patient will benefit from skilled therapeutic intervention in order to improve the following deficits and impairments:  Pain,Decreased activity tolerance,Postural dysfunction,Decreased strength,Increased muscle spasms  Visit Diagnosis: Chronic right-sided low back pain without sciatica  Abnormal posture  Muscle weakness (generalized)     Problem List Patient Active Problem List   Diagnosis Date Noted  . SOB (shortness of breath) 05/17/2019  . Osteoporosis 03/24/2019  . Chest pain of uncertain etiology 73/56/7014  . Carpal tunnel syndrome of right wrist 03/25/2018  . Influenza with pneumonia 10/30/2015  .  Abdominal aortic atherosclerosis ([place]) 04/10/2015  . PSVT (paroxysmal supraventricular tachycardia) ([place]) 05/10/2014  . Macular degeneration, age related, nonexudative 08/24/2013  . History of right mastectomy 07/13/2013  .  Anemia, iron deficiency 06/21/2013  . Allergic rhinitis 06/21/2013  . Chronic low back pain 06/21/2013  . Malignant neoplasm of female breast ([place]) 11/22/2007  . Mixed hyperlipidemia 11/22/2007  . Anxiety state 11/22/2007  . Depression, recurrent ([place]) 11/22/2007  . Asthma 11/22/2007  . GERD 11/22/2007  . DUODENITIS, WITHOUT HEMORRHAGE 08/14/2007  . HIATAL HERNIA 08/14/2007  . 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   Ladean Raya, PTA 12/12/20 1:46 PM  [facility] [place], [place], [address] Phone: [phone]   Fax:  [phone]  Name: Krystal [name] MRN: [id number] Date of Birth: February 04, 1941

## 2020-12-12 NOTE — Patient Instructions (Signed)
 .   HIP ABDUCTION - SIDELYING  While lying on your side, slowly raise up your top leg towards the sky. Keep your knee straight and maintain your toes pointed forward the entire time. Keep your leg in-line with your body.  The bottom leg can be bent to stabilize your body. x20reps 1x day   Hip Bridge  Laying on your back with knees bent, contract glutes to pick up hips into a bridge position. x20 reps 1xday  HIP ADDUCTION PILLOW SQUEEZE - SUPINE HOOK LYING  Lie on your back with knees bent.  Place a folded pillow pillow between your knees and press your knees together so that you squeeze the pillow firmly. Hold and then release and repeat. x20 reps 1xday  SINGLE KNEE TO CHEST STRETCH - SKTC  While lying on your back, use your hands and gently draw up a knee towards your chest.   Keep your other knee straight and lying on the ground. x3-5 reps 30sec hold 1xday

## 2020-12-17 DIAGNOSIS — H353211 Exudative age-related macular degeneration, right eye, with active choroidal neovascularization: Secondary | ICD-10-CM | POA: Diagnosis not present

## 2020-12-17 DIAGNOSIS — H353124 Nonexudative age-related macular degeneration, left eye, advanced atrophic with subfoveal involvement: Secondary | ICD-10-CM | POA: Diagnosis not present

## 2020-12-19 ENCOUNTER — Other Ambulatory Visit: Payer: Self-pay

## 2020-12-19 ENCOUNTER — Ambulatory Visit: Payer: Medicare HMO | Admitting: Physical Therapy

## 2020-12-19 ENCOUNTER — Telehealth: Payer: Self-pay

## 2020-12-19 DIAGNOSIS — G8929 Other chronic pain: Secondary | ICD-10-CM | POA: Diagnosis not present

## 2020-12-19 DIAGNOSIS — M545 Low back pain, unspecified: Secondary | ICD-10-CM

## 2020-12-19 DIAGNOSIS — R293 Abnormal posture: Secondary | ICD-10-CM

## 2020-12-19 DIAGNOSIS — M6281 Muscle weakness (generalized): Secondary | ICD-10-CM | POA: Diagnosis not present

## 2020-12-19 DIAGNOSIS — C50911 Malignant neoplasm of unspecified site of right female breast: Secondary | ICD-10-CM

## 2020-12-19 DIAGNOSIS — Z9011 Acquired absence of right breast and nipple: Secondary | ICD-10-CM

## 2020-12-19 NOTE — Telephone Encounter (Signed)
Updated pending order for Bra from when last ordered 12/13/2018

## 2020-12-19 NOTE — Telephone Encounter (Signed)
Printed order for the patient, will sign tomorrow when I am in office

## 2020-12-19 NOTE — Telephone Encounter (Signed)
Order on Dettinger's desk

## 2020-12-19 NOTE — Telephone Encounter (Signed)
Pt states that she has a history of breast cancer. Right breast mastectomy. Needs order sent to [facility] in [place]. They will contact the patient once they receive the order and have her come to the pharmacy and be measured.

## 2020-12-19 NOTE — Therapy (Addendum)
[facility] [place], [place], [address] Phone: [phone]   Fax:  [phone]  Physical Therapy Treatment  Patient Details  Name: Krystal [name] MRN: [id number] Date of Birth: 12/05/1940 Referring Provider (PT): Caryl Pina MD   Encounter Date: 12/19/2020   PT End of Session - 12/19/20 1304     Visit Number 6    Number of Visits 12    Date for PT Re-Evaluation 02/12/21    Authorization Type FOTO AT LEAST EVERY 5TH VISIT.  PROGRESS NOTE AT 10TH VISIT.  KX MODIFIER AFTER 15 VISITS.    PT Start Time 0100    PT Stop Time 0143    PT Time Calculation (min) 43 min    Activity Tolerance Patient tolerated treatment well;Patient limited by pain    Behavior During Therapy WFL for tasks assessed/performed             Past Medical History:  Diagnosis Date   Anemia    Asthma    Brain tumor ([place])    1986   Breast cancer ([place])    Right breast   Cataract    Colon polyps    COPD (chronic obstructive pulmonary disease) ([place])    Hiatal hernia    Hyperlipidemia    Leg edema    Macular degeneration    Osteoporosis    Pneumonia    PVD (posterior vitreous detachment)    Right knee pain    Shingles rash     Past Surgical History:  Procedure Laterality Date   CATARACT EXTRACTION, BILATERAL     COLONOSCOPY     CRANIECTOMY / CRANIOTOMY FOR EXCISION OF BRAIN TUMOR     Left side brain   MASTECTOMY Right    PARTIAL HYSTERECTOMY     POLYPECTOMY     SKIN CANCER EXCISION Left    Under left eye    There were no vitals filed for this visit.   Subjective Assessment - 12/19/20 1301     Subjective COVID-19 screen performed prior to patient entering clinic. Patient reported some long ride in car caused pain    Pertinent History OP, OA.    How long can you walk comfortably? Short community distance with Rollator.    Patient Stated Goals Decrease pain and walk more.    Currently in Pain? Yes    Pain Score 6     Pain  Location Back    Pain Orientation Right;Lower    Pain Descriptors / Indicators Discomfort    Pain Type Chronic pain    Pain Onset More than a month ago    Pain Frequency Constant    Aggravating Factors  prolong activity, and prolong sitting    Pain Relieving Factors at rest                               [facility] Adult PT Treatment/Exercise - 12/19/20 0001       Knee/Hip Exercises: Stretches   Other Knee/Hip Stretches R SKTC 3x30 sec      Knee/Hip Exercises: Aerobic   Nustep L4 x15 min UE/LE, posture focus      Knee/Hip Exercises: Seated   Long Arc Quad Strengthening;Both;3 sets;10 reps    Long Arc Quad Weight 4 lbs.    Hamstring Curl Strengthening;Both;20 reps    Hamstring Limitations red band      Knee/Hip Exercises: Supine   Bridges Both;20 reps   half range  Straight Leg Raises Strengthening;Right;20 reps   core focus   Other Supine Knee/Hip Exercises clamshell with red band x20   core braceing     Moist Heat Therapy   Number Minutes Moist Heat 10 Minutes    Moist Heat Location Lumbar Spine      Electrical Stimulation   Electrical Stimulation Location Rt SI joint    Electrical Stimulation Action IFC    Electrical Stimulation Parameters 80-150hz  x45min    Electrical Stimulation Goals Pain;Tone                         PT Long Term Goals - 12/19/20 1305       PT LONG TERM GOAL #1   Title Independent with a HEP.    Baseline 12/12/20    Time 6    Period Weeks    Status Achieved      PT LONG TERM GOAL #2   Title Walk 500 feet with Rollator with pain-level not exceeding 3-4/10.    Baseline 6/10 currently 12/19/20    Time 6    Period Weeks    Status On-going      PT LONG TERM GOAL #3   Title Perform ADL's with pain not > 3-4/10.    Baseline 6/10 currently 12/19/20    Time 6    Period Weeks    Status On-going                   Plan - 12/19/20 1333     Clinical Impression Statement Patient tolerated treatment fair  due to ongoing discomfort. Patient able to perform all exercises with some fatigue. Patient continues to have some limitations with ADL's and more pain with prolong sitting. Goals ongoing this week. Normal response to modalities today.    Personal Factors and Comorbidities Comorbidity 1;Other    Comorbidities OP, OA.    Examination-Activity Limitations Other;Locomotion Level    Examination-Participation Restrictions Other    Stability/Clinical Decision Making Evolving/Moderate complexity    Rehab Potential Good    PT Frequency 2x / week    PT Treatment/Interventions ADLs/Self Care Home Management;Cryotherapy;Electrical Stimulation;Ultrasound;Moist Heat;Functional mobility training;Therapeutic activities;Therapeutic exercise;Manual techniques;Patient/family education;Passive range of motion    PT Next Visit Plan con with POC for progression. Modalites and STW/M to patient's right low back.  Slow core exercise progression.    Consulted and Agree with Plan of Care Patient             Patient will benefit from skilled therapeutic intervention in order to improve the following deficits and impairments:  Pain,Decreased activity tolerance,Postural dysfunction,Decreased strength,Increased muscle spasms  Visit Diagnosis: Chronic right-sided low back pain without sciatica  Abnormal posture  Muscle weakness (generalized)     Problem List Patient Active Problem List   Diagnosis Date Noted   SOB (shortness of breath) 05/17/2019   Osteoporosis 03/24/2019   Chest pain of uncertain etiology 33/29/5188   Carpal tunnel syndrome of right wrist 03/25/2018   Influenza with pneumonia 10/30/2015   Abdominal aortic atherosclerosis ([place]) 04/10/2015   PSVT (paroxysmal supraventricular tachycardia) ([place]) 05/10/2014   Macular degeneration, age related, nonexudative 08/24/2013   History of right mastectomy 07/13/2013   Anemia, iron deficiency 06/21/2013   Allergic rhinitis 06/21/2013   Chronic low  back pain 06/21/2013   Malignant neoplasm of female breast ([place]) 11/22/2007   Mixed hyperlipidemia 11/22/2007   Anxiety state 11/22/2007   Depression, recurrent ([place]) 11/22/2007   Asthma 11/22/2007   GERD 11/22/2007  DUODENITIS, WITHOUT HEMORRHAGE 08/14/2007   HIATAL HERNIA 08/14/2007   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

## 2020-12-19 NOTE — Telephone Encounter (Signed)
What kind of bras does she need, what kind of breast surgery did she have?

## 2020-12-19 NOTE — Telephone Encounter (Signed)
Pt called stating that she needs Dr Dettinger to send in an order for her to get new Krystal [name] from where she had Breast surgery.  Needs order sent to [facility].

## 2020-12-26 ENCOUNTER — Encounter: Payer: Medicare HMO | Admitting: Physical Therapy

## 2021-02-06 ENCOUNTER — Encounter: Payer: Self-pay | Admitting: Family Medicine

## 2021-02-06 ENCOUNTER — Ambulatory Visit (INDEPENDENT_AMBULATORY_CARE_PROVIDER_SITE_OTHER): Payer: Medicare HMO | Admitting: Family Medicine

## 2021-02-06 ENCOUNTER — Other Ambulatory Visit: Payer: Self-pay

## 2021-02-06 VITALS — BP 121/69 | HR 89 | Ht 63.0 in | Wt 172.0 lb

## 2021-02-06 DIAGNOSIS — F339 Major depressive disorder, recurrent, unspecified: Secondary | ICD-10-CM | POA: Diagnosis not present

## 2021-02-06 DIAGNOSIS — E782 Mixed hyperlipidemia: Secondary | ICD-10-CM

## 2021-02-06 DIAGNOSIS — F411 Generalized anxiety disorder: Secondary | ICD-10-CM

## 2021-02-06 DIAGNOSIS — Z23 Encounter for immunization: Secondary | ICD-10-CM

## 2021-02-06 DIAGNOSIS — K219 Gastro-esophageal reflux disease without esophagitis: Secondary | ICD-10-CM

## 2021-02-06 DIAGNOSIS — R69 Illness, unspecified: Secondary | ICD-10-CM | POA: Diagnosis not present

## 2021-02-06 DIAGNOSIS — Z780 Asymptomatic menopausal state: Secondary | ICD-10-CM | POA: Diagnosis not present

## 2021-02-06 DIAGNOSIS — R6889 Other general symptoms and signs: Secondary | ICD-10-CM | POA: Diagnosis not present

## 2021-02-06 NOTE — Addendum Note (Signed)
Addended by: Alphonzo Dublin on: 02/06/2021 01:46 PM   Modules accepted: Orders

## 2021-02-06 NOTE — Progress Notes (Signed)
BP 121/69   Pulse 89   Ht '5\' 3"'  (1.6 m)   Wt 172 lb (78 kg)   SpO2 92%   BMI 30.47 kg/m    Subjective:   Patient ID: Krystal [name], female    DOB: [birth year], 80 y.o.   MRN: [id number]  HPI: Krystal [name] is a 80 y.o. female presenting on 02/06/2021 for Medical Management of Chronic Issues, Hyperlipidemia, COPD, and Gastroesophageal Reflux   HPI Anxiety and depression recheck Patient is coming in for anxiety depression, she occasionally uses the Valium but not that often.  She does not need a refill today.  She feels like she does well with that  Hyperlipidemia Patient is coming in for recheck of his hyperlipidemia. The patient is currently taking simvastatin. They deny any issues with myalgias or history of liver damage from it. They deny any focal numbness or weakness or chest pain.   Iron deficiency anemia recheck Patient has been doing well and her hemoglobin has been stable, denies any lightheadedness or dizziness.  Osteoporosis/osteopenia Fractures or history of fracture: None Medication: Prolia and calcium and vitamin D Duration of treatment: 1 year Last bone density scan: 2019 Last T score: -2.9  Patient does complain of back pain especially on the right side of her hip and back.  She has had this chronically and has had MRIs in the past that have a lot of spinal issues on her MRIs in the past but did not follow through on them.  Recommended that she go back to her back doctor.  Relevant past medical, surgical, family and social history reviewed and updated as indicated. Interim medical history since our last visit reviewed. Allergies and medications reviewed and updated.  Review of Systems  Constitutional:  Negative for chills and fever.  HENT:  Negative for congestion, ear discharge and ear pain.   Eyes:  Negative for visual disturbance.  Respiratory:  Negative for chest tightness and shortness of breath.   Cardiovascular:  Negative for chest pain and  leg swelling.  Genitourinary:  Negative for difficulty urinating and dysuria.  Musculoskeletal:  Positive for arthralgias and back pain. Negative for gait problem.  Skin:  Negative for rash.  Neurological:  Negative for light-headedness and headaches.  Psychiatric/Behavioral:  Negative for agitation and behavioral problems.   All other systems reviewed and are negative.  Per HPI unless specifically indicated above   Allergies as of 02/06/2021       Reactions   Actonel [risedronate] Nausea And Vomiting, Other (See Comments)   Throat tightness   Fosamax [alendronate Sodium] Other (See Comments)   esophagitis   Mevacor [lovastatin] Other (See Comments)   myalgias   Miacalcin [calcitonin (salmon)] Nausea And Vomiting   Mobic [meloxicam] Hives, Itching   rash        Medication List        Accurate as of February 06, 2021  1:20 PM. If you have any questions, ask your nurse or doctor.          albuterol 108 (90 Base) MCG/ACT inhaler Commonly known as: Ventolin HFA INHALE TWO PUFFS BY MOUTH 4 TIMES DAILY AS NEEDED   aspirin EC 81 MG tablet Take 81 mg by mouth at bedtime.   Calcium Citrate-Vitamin D 315-250 MG-UNIT Tabs Commonly known as: Calcium Citrate + D Take 2 tablets by mouth daily.   cetirizine 10 MG tablet Commonly known as: ZYRTEC Take 5-10 mg by mouth at bedtime.   diazepam 5 MG tablet Commonly  known as: VALIUM Take 1 tablet (5 mg total) by mouth at bedtime as needed.   diclofenac Sodium 1 % Gel Commonly known as: Voltaren Apply 2 g topically 4 (four) times daily.   diltiazem 300 MG 24 hr capsule Commonly known as: CARDIZEM CD Take 1 capsule (300 mg total) by mouth daily.   esomeprazole 40 MG capsule Commonly known as: NEXIUM TAKE 1 CAPSULE BY MOUTH EVERY DAY   ferrous sulfate 325 (65 FE) MG tablet Take 1 tablet (325 mg total) by mouth at bedtime.   ipratropium-albuterol 0.5-2.5 (3) MG/3ML Soln Commonly known as: DUONEB Take 3 mLs by nebulization  every 6 (six) hours as needed. Dx:  Asthma 493.90   metoprolol tartrate 25 MG tablet Commonly known as: LOPRESSOR TAKE 1 TABLET (25 MG TOTAL) BY MOUTH DAILY AS NEEDED.   montelukast 10 MG tablet Commonly known as: SINGULAIR One tablet by mouth daily at bedtime   nitroGLYCERIN 0.4 MG/SPRAY spray Commonly known as: NITROLINGUAL ONE SPRAY UNDER TONGUE AS NEED FOR CHESTPAIN. MAY REPEAT IN 5 MINUTES IF PAIN NOT RELIEVED CALL 911.   PRESERVISION/LUTEIN PO Take 1 capsule by mouth at bedtime.   Prolia 60 MG/ML Sosy injection Generic drug: denosumab TO BE ADMINISTERED IN PHYSICIAN'S OFFICE. INJECT ONE SYRINGE SUBCOUSLY ONCE EVERY 6 MONTHS. REFRIGERATE. USE WITHIN 14 DAYS ONCE AT ROOM TEMPERATURE.   Restasis 0.05 % ophthalmic emulsion Generic drug: cycloSPORINE Place 1 drop into both eyes 2 (two) times daily.   simvastatin 20 MG tablet Commonly known as: ZOCOR Take 1 tablet (20 mg total) by mouth at bedtime.   Vitamin D 50 MCG (2000 UT) Caps Take 1 capsule by mouth at bedtime.         Objective:   BP 121/69   Pulse 89   Ht '5\' 3"'  (1.6 m)   Wt 172 lb (78 kg)   SpO2 92%   BMI 30.47 kg/m   Wt Readings from Last 3 Encounters:  02/06/21 172 lb (78 kg)  11/05/20 175 lb (79.4 kg)  08/07/20 170 lb (77.1 kg)    Physical Exam Vitals and nursing note reviewed.  Constitutional:      General: She is not in acute distress.    Appearance: She is well-developed. She is not diaphoretic.  Eyes:     Conjunctiva/sclera: Conjunctivae normal.  Cardiovascular:     Rate and Rhythm: Normal rate and regular rhythm.     Heart sounds: Normal heart sounds. No murmur heard. Pulmonary:     Effort: Pulmonary effort is normal. No respiratory distress.     Breath sounds: Normal breath sounds. No wheezing.  Musculoskeletal:     Lumbar back: Tenderness present. No bony tenderness. Negative right straight leg raise test and negative left straight leg raise test.  Skin:    General: Skin is warm and  dry.     Findings: No rash.  Neurological:     Mental Status: She is alert and oriented to person, place, and time.     Coordination: Coordination normal.  Psychiatric:        Behavior: Behavior normal.      Assessment & Plan:   Problem List Items Addressed This Visit       Digestive   GERD     Other   Mixed hyperlipidemia   Relevant Orders   CMP14+EGFR   Anxiety state   Depression, recurrent ([place])   Other Visit Diagnoses     Postmenopausal    -  Primary   Relevant Orders  DG [facility] DEXA   VITAMIN D 25 Hydroxy (Vit-D Deficiency, Fractures)       Will do bone density in the future, did not want to do today, will do some blood work today.  Otherwise follow-up in 3 months. Follow up plan: Return in about 3 months (around 05/09/2021), or if symptoms worsen or fail to improve, for Anxiety and cholesterol recheck.  Counseling provided for all of the vaccine components Orders Placed This Encounter  Procedures   DG [facility] DEXA   VITAMIN D 25 Hydroxy (Vit-D Deficiency, Fractures)   CMP14+EGFR    Caryl Pina, MD [facility] 02/06/2021, 1:20 PM

## 2021-02-09 LAB — CMP14+EGFR
ALT: 19 IU/L (ref 0–32)
AST: 22 IU/L (ref 0–40)
Albumin/Globulin Ratio: 2.1 (ref 1.2–2.2)
Albumin: 4.4 g/dL (ref 3.6–4.6)
Alkaline Phosphatase: 56 IU/L (ref 44–121)
BUN/Creatinine Ratio: 26 (ref 12–28)
BUN: 19 mg/dL (ref 8–27)
Bilirubin Total: 0.3 mg/dL (ref 0.0–1.2)
CO2: 27 mmol/L (ref 20–29)
Calcium: 9.3 mg/dL (ref 8.7–10.3)
Chloride: 103 mmol/L (ref 96–106)
Creatinine, Ser: 0.73 mg/dL (ref 0.57–1.00)
Globulin, Total: 2.1 g/dL (ref 1.5–4.5)
Glucose: 96 mg/dL (ref 65–99)
Potassium: 4.3 mmol/L (ref 3.5–5.2)
Sodium: 146 mmol/L — ABNORMAL HIGH (ref 134–144)
Total Protein: 6.5 g/dL (ref 6.0–8.5)
eGFR: 83 mL/min/{1.73_m2} (ref >59)

## 2021-02-09 LAB — VITAMIN D 25 HYDROXY (VIT D DEFICIENCY, FRACTURES): Vit D, 25-Hydroxy: 34.6 ng/mL (ref 30.0–100.0)

## 2021-02-20 ENCOUNTER — Other Ambulatory Visit: Payer: Self-pay | Admitting: Family Medicine

## 2021-02-25 DIAGNOSIS — H353124 Nonexudative age-related macular degeneration, left eye, advanced atrophic with subfoveal involvement: Secondary | ICD-10-CM | POA: Diagnosis not present

## 2021-02-25 DIAGNOSIS — H353211 Exudative age-related macular degeneration, right eye, with active choroidal neovascularization: Secondary | ICD-10-CM | POA: Diagnosis not present

## 2021-02-25 DIAGNOSIS — H43813 Vitreous degeneration, bilateral: Secondary | ICD-10-CM | POA: Diagnosis not present

## 2021-02-25 DIAGNOSIS — H43392 Other vitreous opacities, left eye: Secondary | ICD-10-CM | POA: Diagnosis not present

## 2021-02-28 ENCOUNTER — Other Ambulatory Visit: Payer: Self-pay

## 2021-02-28 ENCOUNTER — Ambulatory Visit (INDEPENDENT_AMBULATORY_CARE_PROVIDER_SITE_OTHER): Payer: Medicare HMO

## 2021-02-28 DIAGNOSIS — Z780 Asymptomatic menopausal state: Secondary | ICD-10-CM | POA: Diagnosis not present

## 2021-03-01 DIAGNOSIS — Z780 Asymptomatic menopausal state: Secondary | ICD-10-CM | POA: Diagnosis not present

## 2021-03-01 DIAGNOSIS — M810 Age-related osteoporosis without current pathological fracture: Secondary | ICD-10-CM | POA: Diagnosis not present

## 2021-03-01 DIAGNOSIS — M8589 Other specified disorders of bone density and structure, multiple sites: Secondary | ICD-10-CM | POA: Diagnosis not present

## 2021-03-05 ENCOUNTER — Other Ambulatory Visit: Payer: Self-pay | Admitting: Family Medicine

## 2021-03-06 ENCOUNTER — Other Ambulatory Visit: Payer: Self-pay | Admitting: Family Medicine

## 2021-03-07 ENCOUNTER — Telehealth: Payer: Self-pay | Admitting: Family Medicine

## 2021-03-07 NOTE — Telephone Encounter (Signed)
Pt informed that Dettinger has not reviewed results at this time. Will forward message to Dettinger to make aware.

## 2021-03-11 NOTE — Telephone Encounter (Signed)
PA started for [facility]OL[facility]  Cover my meds-  Key: [id number] The patient currently has access to the requested medication and a Prior Authorization is not needed for the patient/medication.  PA will not be able to be completed until due date.  (System still says it is covered, because it is not due yet)

## 2021-03-20 ENCOUNTER — Other Ambulatory Visit: Payer: Self-pay

## 2021-03-20 ENCOUNTER — Emergency Department (HOSPITAL_COMMUNITY): Payer: Medicare HMO

## 2021-03-20 ENCOUNTER — Observation Stay (HOSPITAL_COMMUNITY): Payer: Medicare HMO

## 2021-03-20 ENCOUNTER — Inpatient Hospital Stay (HOSPITAL_COMMUNITY)
Admission: EM | Admit: 2021-03-20 | Discharge: 2021-03-22 | DRG: 286 | Disposition: A | Discharge status: Home Health | Payer: Medicare HMO | Attending: Internal Medicine | Admitting: Internal Medicine

## 2021-03-20 ENCOUNTER — Encounter (HOSPITAL_COMMUNITY): Payer: Self-pay | Admitting: Emergency Medicine

## 2021-03-20 ENCOUNTER — Telehealth: Payer: Self-pay | Admitting: Cardiology

## 2021-03-20 DIAGNOSIS — E782 Mixed hyperlipidemia: Secondary | ICD-10-CM | POA: Diagnosis present

## 2021-03-20 DIAGNOSIS — R079 Chest pain, unspecified: Secondary | ICD-10-CM | POA: Diagnosis not present

## 2021-03-20 DIAGNOSIS — Z83438 Family history of other disorder of lipoprotein metabolism and other lipidemia: Secondary | ICD-10-CM

## 2021-03-20 DIAGNOSIS — Z8262 Family history of osteoporosis: Secondary | ICD-10-CM

## 2021-03-20 DIAGNOSIS — Z90711 Acquired absence of uterus with remaining cervical stump: Secondary | ICD-10-CM

## 2021-03-20 DIAGNOSIS — J449 Chronic obstructive pulmonary disease, unspecified: Secondary | ICD-10-CM | POA: Diagnosis present

## 2021-03-20 DIAGNOSIS — I25110 Atherosclerotic heart disease of native coronary artery with unstable angina pectoris: Principal | ICD-10-CM | POA: Diagnosis present

## 2021-03-20 DIAGNOSIS — Z87891 Personal history of nicotine dependence: Secondary | ICD-10-CM

## 2021-03-20 DIAGNOSIS — I200 Unstable angina: Secondary | ICD-10-CM

## 2021-03-20 DIAGNOSIS — I4892 Unspecified atrial flutter: Secondary | ICD-10-CM | POA: Diagnosis present

## 2021-03-20 DIAGNOSIS — R911 Solitary pulmonary nodule: Secondary | ICD-10-CM | POA: Diagnosis not present

## 2021-03-20 DIAGNOSIS — I480 Paroxysmal atrial fibrillation: Secondary | ICD-10-CM | POA: Diagnosis present

## 2021-03-20 DIAGNOSIS — Z79899 Other long term (current) drug therapy: Secondary | ICD-10-CM

## 2021-03-20 DIAGNOSIS — J9811 Atelectasis: Secondary | ICD-10-CM | POA: Diagnosis not present

## 2021-03-20 DIAGNOSIS — Z9011 Acquired absence of right breast and nipple: Secondary | ICD-10-CM

## 2021-03-20 DIAGNOSIS — I471 Supraventricular tachycardia, unspecified: Secondary | ICD-10-CM | POA: Diagnosis present

## 2021-03-20 DIAGNOSIS — Z7982 Long term (current) use of aspirin: Secondary | ICD-10-CM

## 2021-03-20 DIAGNOSIS — Z87898 Personal history of other specified conditions: Secondary | ICD-10-CM

## 2021-03-20 DIAGNOSIS — Z888 Allergy status to other drugs, medicaments and biological substances status: Secondary | ICD-10-CM

## 2021-03-20 DIAGNOSIS — R0602 Shortness of breath: Secondary | ICD-10-CM

## 2021-03-20 DIAGNOSIS — J45909 Unspecified asthma, uncomplicated: Secondary | ICD-10-CM | POA: Diagnosis present

## 2021-03-20 DIAGNOSIS — K449 Diaphragmatic hernia without obstruction or gangrene: Secondary | ICD-10-CM

## 2021-03-20 DIAGNOSIS — Z85828 Personal history of other malignant neoplasm of skin: Secondary | ICD-10-CM

## 2021-03-20 DIAGNOSIS — I2510 Atherosclerotic heart disease of native coronary artery without angina pectoris: Secondary | ICD-10-CM | POA: Diagnosis present

## 2021-03-20 DIAGNOSIS — Z853 Personal history of malignant neoplasm of breast: Secondary | ICD-10-CM

## 2021-03-20 DIAGNOSIS — I700 Atherosclerosis of aorta: Secondary | ICD-10-CM | POA: Diagnosis present

## 2021-03-20 DIAGNOSIS — I352 Nonrheumatic aortic (valve) stenosis with insufficiency: Secondary | ICD-10-CM | POA: Diagnosis present

## 2021-03-20 DIAGNOSIS — J90 Pleural effusion, not elsewhere classified: Secondary | ICD-10-CM | POA: Diagnosis not present

## 2021-03-20 DIAGNOSIS — R0789 Other chest pain: Secondary | ICD-10-CM | POA: Diagnosis not present

## 2021-03-20 DIAGNOSIS — Z86010 Personal history of colonic polyps: Secondary | ICD-10-CM

## 2021-03-20 DIAGNOSIS — M810 Age-related osteoporosis without current pathological fracture: Secondary | ICD-10-CM | POA: Diagnosis present

## 2021-03-20 DIAGNOSIS — Q211 Atrial septal defect: Secondary | ICD-10-CM

## 2021-03-20 DIAGNOSIS — I5041 Acute combined systolic (congestive) and diastolic (congestive) heart failure: Secondary | ICD-10-CM | POA: Diagnosis present

## 2021-03-20 DIAGNOSIS — H353190 Nonexudative age-related macular degeneration, unspecified eye, stage unspecified: Secondary | ICD-10-CM | POA: Diagnosis present

## 2021-03-20 DIAGNOSIS — J4520 Mild intermittent asthma, uncomplicated: Secondary | ICD-10-CM

## 2021-03-20 DIAGNOSIS — Z20822 Contact with and (suspected) exposure to covid-19: Secondary | ICD-10-CM | POA: Diagnosis present

## 2021-03-20 LAB — BASIC METABOLIC PANEL
Anion gap: 4 — ABNORMAL LOW (ref 5–15)
BUN: 22 mg/dL (ref 8–23)
CO2: 26 mmol/L (ref 22–32)
Calcium: 8.7 mg/dL — ABNORMAL LOW (ref 8.9–10.3)
Chloride: 107 mmol/L (ref 98–111)
Creatinine, Ser: 0.75 mg/dL (ref 0.44–1.00)
GFR, Estimated: >60 mL/min (ref >60)
Glucose, Bld: 104 mg/dL — ABNORMAL HIGH (ref 70–99)
Potassium: 4.1 mmol/L (ref 3.5–5.1)
Sodium: 137 mmol/L (ref 135–145)

## 2021-03-20 LAB — TROPONIN I (HIGH SENSITIVITY)
Troponin I (High Sensitivity): 6 ng/L (ref <18)
Troponin I (High Sensitivity): 6 ng/L (ref <18)

## 2021-03-20 LAB — CBC
HCT: 39.3 % (ref 36.0–46.0)
Hemoglobin: 12.3 g/dL (ref 12.0–15.0)
MCH: 29.6 pg (ref 26.0–34.0)
MCHC: 31.3 g/dL (ref 30.0–36.0)
MCV: 94.7 fL (ref 80.0–100.0)
Platelets: 166 10*3/uL (ref 150–400)
RBC: 4.15 MIL/uL (ref 3.87–5.11)
RDW: 13.5 % (ref 11.5–15.5)
WBC: 5.7 10*3/uL (ref 4.0–10.5)
nRBC: 0 % (ref 0.0–0.2)

## 2021-03-20 LAB — RESP PANEL BY RT-PCR (FLU A&B, COVID) ARPGX2
Influenza A by PCR: NEGATIVE
Influenza B by PCR: NEGATIVE
SARS Coronavirus 2 by RT PCR: NEGATIVE

## 2021-03-20 LAB — BRAIN NATRIURETIC PEPTIDE: B Natriuretic Peptide: 172 pg/mL — ABNORMAL HIGH (ref 0.0–100.0)

## 2021-03-20 LAB — D-DIMER, QUANTITATIVE: D-Dimer, Quant: 0.95 ug/mL-FEU — ABNORMAL HIGH (ref 0.00–0.50)

## 2021-03-20 MED ORDER — DICLOFENAC SODIUM 1 % EX GEL
2.0000 g | Freq: Four times a day (QID) | CUTANEOUS | Status: DC
  Administered 2021-03-20 – 2021-03-22 (×4): 2 g via TOPICAL
  Filled 2021-03-20 (×3): qty 100

## 2021-03-20 MED ORDER — SODIUM CHLORIDE 0.9 % IV SOLN: 250.0000 mL | INTRAVENOUS | Status: DC | PRN

## 2021-03-20 MED ORDER — SODIUM CHLORIDE 0.9% FLUSH
3.0000 mL | Freq: Two times a day (BID) | INTRAVENOUS | Status: DC
  Administered 2021-03-20 – 2021-03-21 (×2): 3 mL via INTRAVENOUS

## 2021-03-20 MED ORDER — IOHEXOL 350 MG/ML SOLN
100.0000 mL | Freq: Once | INTRAVENOUS | Status: AC | PRN
  Administered 2021-03-20: 75 mL via INTRAVENOUS

## 2021-03-20 MED ORDER — ASPIRIN EC 81 MG PO TBEC
81.0000 mg | DELAYED_RELEASE_TABLET | Freq: Every day | ORAL | Status: DC
  Administered 2021-03-20: 81 mg via ORAL
  Filled 2021-03-20: qty 1

## 2021-03-20 MED ORDER — IPRATROPIUM-ALBUTEROL 0.5-2.5 (3) MG/3ML IN SOLN
3.0000 mL | Freq: Four times a day (QID) | RESPIRATORY_TRACT | Status: DC
  Administered 2021-03-20: 3 mL via RESPIRATORY_TRACT
  Filled 2021-03-20: qty 3

## 2021-03-20 MED ORDER — ENOXAPARIN SODIUM 60 MG/0.6ML IJ SOSY
0.5000 mg/kg | PREFILLED_SYRINGE | INTRAMUSCULAR | Status: DC
  Administered 2021-03-20: 47.5 mg via SUBCUTANEOUS
  Filled 2021-03-20: qty 0.6

## 2021-03-20 MED ORDER — ENOXAPARIN SODIUM 40 MG/0.4ML IJ SOSY: 40.0000 mg | PREFILLED_SYRINGE | INTRAMUSCULAR | Status: DC

## 2021-03-20 MED ORDER — ONDANSETRON HCL 4 MG/2ML IJ SOLN: 4.0000 mg | Freq: Four times a day (QID) | INTRAMUSCULAR | Status: DC | PRN

## 2021-03-20 MED ORDER — ACETAMINOPHEN 325 MG PO TABS: 650.0000 mg | ORAL_TABLET | Freq: Four times a day (QID) | ORAL | Status: DC | PRN

## 2021-03-20 MED ORDER — DILTIAZEM HCL ER COATED BEADS 180 MG PO CP24
300.0000 mg | ORAL_CAPSULE | Freq: Every day | ORAL | Status: DC
  Administered 2021-03-21 – 2021-03-22 (×2): 300 mg via ORAL
  Filled 2021-03-20 (×2): qty 1

## 2021-03-20 MED ORDER — PANTOPRAZOLE SODIUM 40 MG PO TBEC
40.0000 mg | DELAYED_RELEASE_TABLET | Freq: Every day | ORAL | Status: DC
  Administered 2021-03-20 – 2021-03-22 (×3): 40 mg via ORAL
  Filled 2021-03-20 (×3): qty 1

## 2021-03-20 MED ORDER — FUROSEMIDE 10 MG/ML IJ SOLN
20.0000 mg | Freq: Two times a day (BID) | INTRAMUSCULAR | Status: DC
  Administered 2021-03-21: 20 mg via INTRAVENOUS
  Filled 2021-03-20: qty 2

## 2021-03-20 MED ORDER — DIAZEPAM 5 MG PO TABS: 5.0000 mg | ORAL_TABLET | Freq: Every evening | ORAL | Status: DC | PRN

## 2021-03-20 MED ORDER — IPRATROPIUM-ALBUTEROL 0.5-2.5 (3) MG/3ML IN SOLN
3.0000 mL | Freq: Three times a day (TID) | RESPIRATORY_TRACT | Status: DC
  Administered 2021-03-21 – 2021-03-22 (×3): 3 mL via RESPIRATORY_TRACT
  Filled 2021-03-20 (×3): qty 3

## 2021-03-20 MED ORDER — SODIUM CHLORIDE 0.9% FLUSH: 3.0000 mL | INTRAVENOUS | Status: DC | PRN

## 2021-03-20 MED ORDER — MONTELUKAST SODIUM 10 MG PO TABS
10.0000 mg | ORAL_TABLET | Freq: Every day | ORAL | Status: DC
  Administered 2021-03-20 – 2021-03-21 (×2): 10 mg via ORAL
  Filled 2021-03-20 (×2): qty 1

## 2021-03-20 MED ORDER — SIMVASTATIN 20 MG PO TABS
20.0000 mg | ORAL_TABLET | Freq: Every day | ORAL | Status: DC
  Administered 2021-03-21: 20 mg via ORAL
  Filled 2021-03-20: qty 1

## 2021-03-20 MED ORDER — ACETAMINOPHEN 325 MG PO TABS: 650.0000 mg | ORAL_TABLET | ORAL | Status: DC | PRN

## 2021-03-20 MED ORDER — CYCLOSPORINE 0.05 % OP EMUL
1.0000 [drp] | Freq: Two times a day (BID) | OPHTHALMIC | Status: DC
  Administered 2021-03-20 – 2021-03-22 (×4): 1 [drp] via OPHTHALMIC
  Filled 2021-03-20 (×5): qty 1

## 2021-03-20 NOTE — Telephone Encounter (Signed)
Called patient, pt reports since last night she has had symptoms of shortness of breath, chest pain, and swelling. Pt reports last night she woke up short of breath and noticed increased swelling in her ankles. She describes her chest pain as "tightness" that is "all across the front" of her chest. She reports she is currently having this pain. Nurse looked in patient's chart and found she was supposed to have a cath procedure over a year ago (April 2021), but she never had it done due to not having a ride.   Nurse advised pt because she is having active chest pain she needs to call 911 or be taken to the emergency room for evaluation. Pt verbalized understanding.

## 2021-03-20 NOTE — Progress Notes (Signed)
PHARMACIST - PHYSICIAN COMMUNICATION  CONCERNING:  Enoxaparin (Lovenox) for DVT Prophylaxis    RECOMMENDATION: Patient was prescribed enoxaprin '40mg'$  q24 hours for VTE prophylaxis.   Filed Weights   03/20/21 1202 03/20/21 1311  Weight: 79.8 kg (176 lb) 93.8 kg (206 lb 11.2 oz)    Body mass index is 36.62 kg/m.  Estimated Creatinine Clearance: 60.1 mL/min (by C-G formula based on SCr of 0.75 mg/dL).   Based on [place] patient is candidate for enoxaparin 0.'5mg'$ /kg TBW SQ every 24 hours based on BMI being >30.  DESCRIPTION: Pharmacy has adjusted enoxaparin dose per [facility] policy.  Patient is now receiving enoxaparin 47.5 mg every 24 hours    Berta Minor, PharmD Clinical Pharmacist  03/20/2021 6:36 PM

## 2021-03-20 NOTE — Progress Notes (Deleted)
History and Physical    Krystal [name] [id number] DOB: 09/09/1940 DOA: 03/20/2021  PCP: Dettinger, Fransisca Kaufmann, MD  Patient coming from: Home  I have personally briefly reviewed patient's old medical records in [place]  Chief Complaint: Progressive shortness of breath and chest pain  HPI: Krystal [name] is a 80 y.o. female with medical history significant of coronary artery disease, paroxysmal supraventricular tachycardia, hyperlipidemia, presents to the hospital with complaints of shortness of breath and chest pain.  She has had the symptoms on and off for quite some time now.  Her current symptoms began approximately 5 days ago.  She reports they have progressively gotten worse.  Symptoms occur at rest and on exertion.  She says she sleeps on her side with 2 pillows.  She has noticed worsening of lower extremity edema over the past 5 days.  Chest discomfort is described as a tightness across her chest which is nonradiating.  She has occasional cough, no fever.  She has noted intermittent wheezing.  She is noted that her weight is up significantly over the past 6 weeks, approximately 30 pounds.  ED Course: In the emergency room, EKG was noted to be unremarkable, cardiac enzymes negative x2.  BNP mildly elevated at 172, D-dimer mildly positive at 0.95.  Chest x-ray did not show any overt CHF.  She is afebrile, heart rate and blood pressure are stable.  She is currently on room air.  She says she becomes very short of breath on minimal exertion.  Review of Systems: Review of Systems  Constitutional:  Negative for chills and fever.  HENT:  Negative for congestion and sore throat.   Eyes:  Negative for blurred vision and double vision.  Respiratory:  Positive for cough, shortness of breath and wheezing.   Cardiovascular:  Positive for chest pain, orthopnea and leg swelling.  Gastrointestinal:  Positive for heartburn. Negative for abdominal pain, diarrhea, nausea and vomiting.   Genitourinary:  Negative for dysuria.  Musculoskeletal:  Positive for back pain, joint pain and neck pain.  Neurological:  Positive for weakness. Negative for dizziness, sensory change and focal weakness.     Past Medical History:  Diagnosis Date   Anemia    Asthma    Brain tumor ([place])    1986   Breast cancer ([place])    Right breast   Cataract    Colon polyps    COPD (chronic obstructive pulmonary disease) ([facility])    Hiatal hernia    Hyperlipidemia    Leg edema    Macular degeneration    Osteoporosis    Pneumonia    PVD (posterior vitreous detachment)    Right knee pain    Shingles rash     Past Surgical History:  Procedure Laterality Date   CATARACT EXTRACTION, BILATERAL     COLONOSCOPY     CRANIECTOMY / CRANIOTOMY FOR EXCISION OF BRAIN TUMOR     Left side brain   MASTECTOMY Right    PARTIAL HYSTERECTOMY     POLYPECTOMY     SKIN CANCER EXCISION Left    Under left eye    Social History:  reports that she quit smoking about 60 years ago. Her smoking use included cigarettes. She has never used smokeless tobacco. She reports that she does not drink alcohol and does not use drugs.  Allergies  Allergen Reactions   Actonel [Risedronate] Nausea And Vomiting and Other (See Comments)    Throat tightness   Fosamax [Alendronate Sodium] Other (See Comments)  esophagitis   Mevacor [Lovastatin] Other (See Comments)    myalgias   Miacalcin [Calcitonin ([place])] Nausea And Vomiting   Mobic [Meloxicam] Hives and Itching    rash    Family History  Problem Relation Age of Onset   Heart disease Mother 32       Stroke    Dementia Mother    Congestive Heart Failure Mother    Osteoporosis Mother    Stroke Mother    Heart disease Sister    Hypertension Sister    Diabetes Sister    Osteoporosis Sister    Heart disease Brother    Hyperlipidemia Brother    Diabetes Brother    Heart disease Sister    Hypertension Sister    Diabetes Sister    Diabetes Sister     Hypertension Sister    Diabetes Sister    Hypertension Sister    Diabetes Brother    Hyperlipidemia Brother    Cancer Father        lung   Heart attack Father    Diabetes Brother    Heart disease Brother    Hyperlipidemia Brother    Diabetes Brother    Diabetes Son    Colon cancer Neg Hx    Rectal cancer Neg Hx    Stomach cancer Neg Hx    Esophageal cancer Neg Hx      Prior to Admission medications   Medication Sig Start Date End Date Taking? Authorizing Provider  albuterol (VENTOLIN HFA) 108 (90 Base) MCG/ACT inhaler INHALE TWO PUFFS BY MOUTH 4 TIMES DAILY AS NEEDED 03/06/21   Dettinger, Fransisca Kaufmann, MD  aspirin EC 81 MG tablet Take 81 mg by mouth at bedtime.    [provider]  Calcium Citrate-Vitamin D (CALCIUM CITRATE + D) 315-250 MG-UNIT TABS Take 2 tablets by mouth daily. 12/10/15   Cherre Robins, PharmD  cetirizine (ZYRTEC) 10 MG tablet Take 5-10 mg by mouth at bedtime.     [provider]  Cholecalciferol (VITAMIN D) 2000 UNITS CAPS Take 1 capsule by mouth at bedtime.    [provider]  denosumab (PROLIA) 60 MG/ML SOSY injection TO BE ADMINISTERED IN PHYSICIAN'S OFFICE. INJECT ONE SYRINGE SUBCOUSLY ONCE EVERY 6 MONTHS. REFRIGERATE. USE WITHIN 14 DAYS ONCE AT ROOM TEMPERATURE. 09/05/20   Dettinger, Fransisca Kaufmann, MD  diazepam (VALIUM) 5 MG tablet Take 1 tablet (5 mg total) by mouth at bedtime as needed. 11/05/20   Dettinger, Fransisca Kaufmann, MD  diclofenac Sodium (VOLTAREN) 1 % GEL Apply 2 g topically 4 (four) times daily. 11/05/20   Dettinger, Fransisca Kaufmann, MD  diltiazem (CARDIZEM CD) 300 MG 24 hr capsule Take 1 capsule (300 mg total) by mouth daily. 11/05/20   Dettinger, Fransisca Kaufmann, MD  esomeprazole (NEXIUM) 40 MG capsule TAKE 1 CAPSULE BY MOUTH EVERY DAY 11/05/20   Dettinger, Fransisca Kaufmann, MD  ferrous sulfate 325 (65 FE) MG tablet Take 1 tablet (325 mg total) by mouth at bedtime. 11/05/20   Dettinger, Fransisca Kaufmann, MD  ipratropium-albuterol (DUONEB) 0.5-2.5 (3) MG/3ML SOLN Take 3 mLs by  nebulization every 6 (six) hours as needed. Dx:  Asthma 493.90 04/03/17   Chipper Herb, MD  metoprolol tartrate (LOPRESSOR) 25 MG tablet TAKE 1 TABLET (25 MG TOTAL) BY MOUTH DAILY AS NEEDED. 11/05/20   Dettinger, Fransisca Kaufmann, MD  montelukast (SINGULAIR) 10 MG tablet One tablet by mouth daily at bedtime 11/05/20   Dettinger, Fransisca Kaufmann, MD  Multiple Vitamins-Minerals (PRESERVISION/LUTEIN PO) Take 1 capsule by mouth  at bedtime.     [provider]  nitroGLYCERIN (NITROLINGUAL) 0.4 MG/SPRAY spray ONE SPRAY UNDER TONGUE AS NEED FOR CHESTPAIN. MAY REPEAT IN 5 MINUTES IF PAIN NOT RELIEVED CALL 911. 11/17/18   Minus Breeding, MD  RESTASIS 0.05 % ophthalmic emulsion Place 1 drop into both eyes 2 (two) times daily. 03/05/18   [provider]  simvastatin (ZOCOR) 20 MG tablet TAKE 1 TABLET BY MOUTH EVERYDAY AT BEDTIME 03/06/21   Dettinger, Fransisca Kaufmann, MD    Physical Exam: Vitals:   03/20/21 1330 03/20/21 1400 03/20/21 1430 03/20/21 1730  BP: 125/62 132/72 127/66 (!) 130/47  Pulse: 82 82 87 78  Resp: '15 19 18 16  '$ Temp:      TempSrc:      SpO2: 95% 97% 98% 95%  Weight:      Height:        Constitutional: NAD, calm, comfortable Eyes: PERRL, lids and conjunctivae normal ENMT: Mucous membranes are moist. Posterior pharynx clear of any exudate or lesions.Normal dentition.  Neck: normal, supple, no masses, no thyromegaly Respiratory: mostly clear bilaterally with occasional rhonchi. Normal respiratory effort. No accessory muscle use.  Cardiovascular: Regular rate and rhythm, no murmurs / rubs / gallops.  Bilateral lower extremity edema. 2+ pedal pulses. No carotid bruits.  Abdomen: no tenderness, no masses palpated. No hepatosplenomegaly. Bowel sounds positive.  Musculoskeletal: no clubbing / cyanosis. No joint deformity upper and lower extremities. Good ROM, no contractures. Normal muscle tone.  Skin: no rashes, lesions, ulcers. No induration Neurologic: CN 2-12 grossly intact. Sensation intact,  DTR normal. Strength 5/5 in all 4.  Psychiatric: Normal judgment and insight. Alert and oriented x 3. Normal mood.    Labs on Admission: I have personally reviewed following labs and imaging studies  CBC: Recent Labs  Lab 03/20/21 1226  WBC 5.7  HGB 12.3  HCT 39.3  MCV 94.7  PLT [id number]   Basic Metabolic Panel: Recent Labs  Lab 03/20/21 1226  NA 137  K 4.1  CL 107  CO2 26  GLUCOSE 104*  BUN 22  CREATININE 0.75  CALCIUM 8.7*   GFR: Estimated Creatinine Clearance: 60.1 mL/min (by C-G formula based on SCr of 0.75 mg/dL). Liver Function Tests: No results for input(s): AST, ALT, ALKPHOS, BILITOT, PROT, ALBUMIN in the last 168 hours. No results for input(s): LIPASE, AMYLASE in the last 168 hours. No results for input(s): AMMONIA in the last 168 hours. Coagulation Profile: No results for input(s): INR, PROTIME in the last 168 hours. Cardiac Enzymes: No results for input(s): CKTOTAL, CKMB, CKMBINDEX, TROPONINI in the last 168 hours. BNP (last 3 results) No results for input(s): PROBNP in the last 8760 hours. HbA1C: No results for input(s): HGBA1C in the last 72 hours. CBG: No results for input(s): GLUCAP in the last 168 hours. Lipid Profile: No results for input(s): CHOL, HDL, LDLCALC, TRIG, CHOLHDL, LDLDIRECT in the last 72 hours. Thyroid Function Tests: No results for input(s): TSH, T4TOTAL, FREET4, T3FREE, THYROIDAB in the last 72 hours. Anemia Panel: No results for input(s): VITAMINB12, FOLATE, FERRITIN, TIBC, IRON, RETICCTPCT in the last 72 hours. Urine analysis:    Component Value Date/Time   COLORURINE YELLOW 10/29/2015 1655   APPEARANCEUR Clear 08/21/2018 1029   LABSPEC 1.015 10/29/2015 1655   PHURINE 7.0 10/29/2015 1655   GLUCOSEU Negative 08/21/2018 1029   HGBUR NEGATIVE 10/29/2015 1655   BILIRUBINUR Negative 08/21/2018 [place] 10/29/2015 1655   PROTEINUR 1+ (A) 08/21/2018 1029   PROTEINUR NEGATIVE 10/29/2015  1655   NITRITE Negative  08/21/2018 1029   NITRITE NEGATIVE 10/29/2015 1655   LEUKOCYTESUR Negative 08/21/2018 1029    Radiological Exams on Admission: DG Chest 2 View  Result Date: 03/20/2021 CLINICAL DATA:  Chest pain, tightness, shortness of breath EXAM: CHEST - 2 VIEW COMPARISON:  None. FINDINGS: The cardiomediastinal silhouette is similar to the prior study. There is calcified atherosclerotic plaque of the aortic arch. The lungs remain hyperinflated consistent with COPD. There is no focal consolidation or pulmonary edema. There is no significant effusion. There is no pneumothorax. Wedge-shaped opacity in the right apex is unchanged going back multiple prior studies and likely reflects scarring. Retrocardiac opacity is consistent with a large hiatal hernia as seen on prior CT. There is no acute osseous abnormality. IMPRESSION: 1. No radiographic evidence of acute cardiopulmonary process. 2. COPD. 3. Large hiatal hernia. Electronically Signed   By: Valetta Mole M.D.   On: 03/20/2021 13:12    EKG: Independently reviewed.  Sinus rhythm without acute changes  Assessment/Plan Active Problems:   Mixed hyperlipidemia   Asthma   Diaphragmatic hernia   Chest pain   CAD (coronary artery disease)    Acute CHF exacerbation -Clinically, the patient does appear to be volume overloaded -She has had a weight gain of 30 pounds over the past 6 weeks, will this may not all be related to fluid -Last visit with PCP 02/2021, her weight was 172 pounds -Current weight on admission 206 pounds -BNP mildly elevated -We will recheck echocardiogram -Started on intravenous Lasix and monitor intake and output  Dyspnea -Possibly multifactorial -Likely some component of CHF is contributing -She has a history of asthma and her chest x-ray does indicate possible COPD -She will be continued on bronchodilators -D-dimer mildly elevated, will check CTA chest  Chest pain -EKG and troponin unremarkable -Last seen by Dr. Percival Spanish in 11/2019.   Since she did have a history of nonobstructive and distal vessel coronary artery disease, with her persisting symptoms, it was recommended that she proceed with cardiac catheterization.  Patient was unable to have this done due to issues with transportation and COVID restrictions -We will request cardiology input to see if further invasive cardiac work-up is indicated at this time. -Keep n.p.o. after midnight in case further work-up is planned  History of hiatal hernia -Continue on PPI  Asthma/COPD -Continue on Singulair and bronchodilators -She will probably benefit from outpatient pulmonology evaluation for PFTs -She does not report any significant smoking history or exposure to secondhand smoke  Hyperlipidemia -Continue on statin   DVT prophylaxis: Lovenox Code Status: Full code Family Communication: Discussed with son at the bedside Disposition Plan: Discharge home once respiratory status has improved Consults called: Cardiology Admission status: Observation, telemetry  Kathie Dike MD [facility]   If 7PM-7AM, please contact night-coverage [email or web address]   03/20/2021, 6:31 PM

## 2021-03-20 NOTE — Plan of Care (Signed)
  Problem: Education: Goal: Knowledge of General Education information will improve Description Including pain rating scale, medication(s)/side effects and non-pharmacologic comfort measures Outcome: Progressing   

## 2021-03-20 NOTE — H&P (Signed)
History and Physical    Krystal [name] [id number] DOB: 1940-11-14 DOA: 03/20/2021  PCP: Dettinger, Fransisca Kaufmann, MD  Patient coming from: Home  I have personally briefly reviewed patient's old medical records in [place]  Chief Complaint: Progressive shortness of breath and chest pain  HPI: Krystal [name] is a 80 y.o. female with medical history significant of coronary artery disease, paroxysmal supraventricular tachycardia, hyperlipidemia, presents to the hospital with complaints of shortness of breath and chest pain.  She has had the symptoms on and off for quite some time now.  Her current symptoms began approximately 5 days ago.  She reports they have progressively gotten worse.  Symptoms occur at rest and on exertion.  She says she sleeps on her side with 2 pillows.  She has noticed worsening of lower extremity edema over the past 5 days.  Chest discomfort is described as a tightness across her chest which is nonradiating.  She has occasional cough, no fever.  She has noted intermittent wheezing.  She is noted that her weight is up significantly over the past 6 weeks, approximately 30 pounds.  ED Course: In the emergency room, EKG was noted to be unremarkable, cardiac enzymes negative x2.  BNP mildly elevated at 172, D-dimer mildly positive at 0.95.  Chest x-ray did not show any overt CHF.  She is afebrile, heart rate and blood pressure are stable.  She is currently on room air.  She says she becomes very short of breath on minimal exertion.  Review of Systems:Review of Systems  Constitutional:  Negative for chills and fever.  HENT:  Negative for congestion and sore throat.   Eyes:  Negative for blurred vision and double vision.  Respiratory:  Positive for cough, shortness of breath and wheezing.   Cardiovascular:  Positive for chest pain and leg swelling.  Gastrointestinal:  Positive for heartburn. Negative for abdominal pain, nausea and vomiting.  Genitourinary:  Negative  for dysuria.  Musculoskeletal:  Positive for back pain and neck pain.  Neurological:  Negative for dizziness and focal weakness.      Past Medical History:  Diagnosis Date   Anemia    Asthma    Brain tumor ([place])    1986   Breast cancer ([place])    Right breast   Cataract    Colon polyps    COPD (chronic obstructive pulmonary disease) (HCC)    Hiatal hernia    Hyperlipidemia    Leg edema    Macular degeneration    Osteoporosis    Pneumonia    PVD (posterior vitreous detachment)    Right knee pain    Shingles rash     Past Surgical History:  Procedure Laterality Date   CATARACT EXTRACTION, BILATERAL     COLONOSCOPY     CRANIECTOMY / CRANIOTOMY FOR EXCISION OF BRAIN TUMOR     Left side brain   MASTECTOMY Right    PARTIAL HYSTERECTOMY     POLYPECTOMY     SKIN CANCER EXCISION Left    Under left eye    Social History:  reports that she quit smoking about 60 years ago. Her smoking use included cigarettes. She has never used smokeless tobacco. She reports that she does not drink alcohol and does not use drugs.  Allergies  Allergen Reactions   Actonel [Risedronate] Nausea And Vomiting and Other (See Comments)    Throat tightness   Fosamax [Alendronate Sodium] Other (See Comments)    esophagitis   Mevacor [Lovastatin] Other (  See Comments)    myalgias   Miacalcin [Calcitonin ([place])] Nausea And Vomiting   Mobic [Meloxicam] Hives and Itching    rash    Family History  Problem Relation Age of Onset   Heart disease Mother 95       Stroke    Dementia Mother    Congestive Heart Failure Mother    Osteoporosis Mother    Stroke Mother    Heart disease Sister    Hypertension Sister    Diabetes Sister    Osteoporosis Sister    Heart disease Brother    Hyperlipidemia Brother    Diabetes Brother    Heart disease Sister    Hypertension Sister    Diabetes Sister    Diabetes Sister    Hypertension Sister    Diabetes Sister    Hypertension Sister    Diabetes Brother     Hyperlipidemia Brother    Cancer Father        lung   Heart attack Father    Diabetes Brother    Heart disease Brother    Hyperlipidemia Brother    Diabetes Brother    Diabetes Son    Colon cancer Neg Hx    Rectal cancer Neg Hx    Stomach cancer Neg Hx    Esophageal cancer Neg Hx      Prior to Admission medications   Medication Sig Start Date End Date Taking? Authorizing Provider  albuterol (VENTOLIN HFA) 108 (90 Base) MCG/ACT inhaler INHALE TWO PUFFS BY MOUTH 4 TIMES DAILY AS NEEDED 03/06/21   Dettinger, Fransisca Kaufmann, MD  aspirin EC 81 MG tablet Take 81 mg by mouth at bedtime.    [provider]  Calcium Citrate-Vitamin D (CALCIUM CITRATE + D) 315-250 MG-UNIT TABS Take 2 tablets by mouth daily. 12/10/15   Cherre Robins, PharmD  cetirizine (ZYRTEC) 10 MG tablet Take 5-10 mg by mouth at bedtime.     [provider]  Cholecalciferol (VITAMIN D) 2000 UNITS CAPS Take 1 capsule by mouth at bedtime.    [provider]  denosumab (PROLIA) 60 MG/ML SOSY injection TO BE ADMINISTERED IN PHYSICIAN'S OFFICE. INJECT ONE SYRINGE SUBCOUSLY ONCE EVERY 6 MONTHS. REFRIGERATE. USE WITHIN 14 DAYS ONCE AT ROOM TEMPERATURE. 09/05/20   Dettinger, Fransisca Kaufmann, MD  diazepam (VALIUM) 5 MG tablet Take 1 tablet (5 mg total) by mouth at bedtime as needed. 11/05/20   Dettinger, Fransisca Kaufmann, MD  diclofenac Sodium (VOLTAREN) 1 % GEL Apply 2 g topically 4 (four) times daily. 11/05/20   Dettinger, Fransisca Kaufmann, MD  diltiazem (CARDIZEM CD) 300 MG 24 hr capsule Take 1 capsule (300 mg total) by mouth daily. 11/05/20   Dettinger, Fransisca Kaufmann, MD  esomeprazole (NEXIUM) 40 MG capsule TAKE 1 CAPSULE BY MOUTH EVERY DAY 11/05/20   Dettinger, Fransisca Kaufmann, MD  ferrous sulfate 325 (65 FE) MG tablet Take 1 tablet (325 mg total) by mouth at bedtime. 11/05/20   Dettinger, Fransisca Kaufmann, MD  ipratropium-albuterol (DUONEB) 0.5-2.5 (3) MG/3ML SOLN Take 3 mLs by nebulization every 6 (six) hours as needed. Dx:  Asthma 493.90 04/03/17   Chipper Herb, MD  metoprolol tartrate (LOPRESSOR) 25 MG tablet TAKE 1 TABLET (25 MG TOTAL) BY MOUTH DAILY AS NEEDED. 11/05/20   Dettinger, Fransisca Kaufmann, MD  montelukast (SINGULAIR) 10 MG tablet One tablet by mouth daily at bedtime 11/05/20   Dettinger, Fransisca Kaufmann, MD  Multiple Vitamins-Minerals (PRESERVISION/LUTEIN PO) Take 1 capsule by mouth at bedtime.  [provider]  nitroGLYCERIN (NITROLINGUAL) 0.4 MG/SPRAY spray ONE SPRAY UNDER TONGUE AS NEED FOR CHESTPAIN. MAY REPEAT IN 5 MINUTES IF PAIN NOT RELIEVED CALL 911. 11/17/18   Minus Breeding, MD  RESTASIS 0.05 % ophthalmic emulsion Place 1 drop into both eyes 2 (two) times daily. 03/05/18   [provider]  simvastatin (ZOCOR) 20 MG tablet TAKE 1 TABLET BY MOUTH EVERYDAY AT BEDTIME 03/06/21   Dettinger, Fransisca Kaufmann, MD    Physical Exam: Vitals:   03/20/21 1900 03/20/21 1949 03/20/21 2010 03/20/21 2024  BP: 131/81 139/70 131/73   Pulse:  84 81   Resp: '15 20 19   '$ Temp:  97.8 F (36.6 C) 97.8 F (36.6 C)   TempSrc:  Oral Oral   SpO2:  97% 96% 95%  Weight:      Height:        Constitutional: NAD, calm, comfortable Eyes: PERRL, lids and conjunctivae normal ENMT: Mucous membranes are moist. Posterior pharynx clear of any exudate or lesions.Normal dentition.  Neck: normal, supple, no masses, no thyromegaly Respiratory: mostly clear bilaterally with occasional rhonchi. Normal respiratory effort. No accessory muscle use.  Cardiovascular: Regular rate and rhythm, no murmurs / rubs / gallops.  Bilateral lower extremity edema. 2+ pedal pulses. No carotid bruits.  Abdomen: no tenderness, no masses palpated. No hepatosplenomegaly. Bowel sounds positive.  Musculoskeletal: no clubbing / cyanosis. No joint deformity upper and lower extremities. Good ROM, no contractures. Normal muscle tone.  Skin: no rashes, lesions, ulcers. No induration Neurologic: CN 2-12 grossly intact. Sensation intact, DTR normal. Strength 5/5 in all 4.  Psychiatric: Normal  judgment and insight. Alert and oriented x 3. Normal mood.    Labs on Admission: I have personally reviewed following labs and imaging studies  CBC: Recent Labs  Lab 03/20/21 1226  WBC 5.7  HGB 12.3  HCT 39.3  MCV 94.7  PLT [id number]    Basic Metabolic Panel: Recent Labs  Lab 03/20/21 1226  NA 137  K 4.1  CL 107  CO2 26  GLUCOSE 104*  BUN 22  CREATININE 0.75  CALCIUM 8.7*    GFR: Estimated Creatinine Clearance: 60.1 mL/min (by C-G formula based on SCr of 0.75 mg/dL). Liver Function Tests: No results for input(s): AST, ALT, ALKPHOS, BILITOT, PROT, ALBUMIN in the last 168 hours. No results for input(s): LIPASE, AMYLASE in the last 168 hours. No results for input(s): AMMONIA in the last 168 hours. Coagulation Profile: No results for input(s): INR, PROTIME in the last 168 hours. Cardiac Enzymes: No results for input(s): CKTOTAL, CKMB, CKMBINDEX, TROPONINI in the last 168 hours. BNP (last 3 results) No results for input(s): PROBNP in the last 8760 hours. HbA1C: No results for input(s): HGBA1C in the last 72 hours. CBG: No results for input(s): GLUCAP in the last 168 hours. Lipid Profile: No results for input(s): CHOL, HDL, LDLCALC, TRIG, CHOLHDL, LDLDIRECT in the last 72 hours. Thyroid Function Tests: No results for input(s): TSH, T4TOTAL, FREET4, T3FREE, THYROIDAB in the last 72 hours. Anemia Panel: No results for input(s): VITAMINB12, FOLATE, FERRITIN, TIBC, IRON, RETICCTPCT in the last 72 hours. Urine analysis:    Component Value Date/Time   COLORURINE YELLOW 10/29/2015 1655   APPEARANCEUR Clear 08/21/2018 1029   LABSPEC 1.015 10/29/2015 1655   PHURINE 7.0 10/29/2015 1655   GLUCOSEU Negative 08/21/2018 1029   HGBUR NEGATIVE 10/29/2015 1655   BILIRUBINUR Negative 08/21/2018 1029   KETONESUR NEGATIVE 10/29/2015 1655   PROTEINUR 1+ (A) 08/21/2018 1029   PROTEINUR NEGATIVE  10/29/2015 1655   NITRITE Negative 08/21/2018 1029   NITRITE NEGATIVE 10/29/2015 1655    LEUKOCYTESUR Negative 08/21/2018 1029    Radiological Exams on Admission: DG Chest 2 View  Result Date: 03/20/2021 CLINICAL DATA:  Chest pain, tightness, shortness of breath EXAM: CHEST - 2 VIEW COMPARISON:  None. FINDINGS: The cardiomediastinal silhouette is similar to the prior study. There is calcified atherosclerotic plaque of the aortic arch. The lungs remain hyperinflated consistent with COPD. There is no focal consolidation or pulmonary edema. There is no significant effusion. There is no pneumothorax. Wedge-shaped opacity in the right apex is unchanged going back multiple prior studies and likely reflects scarring. Retrocardiac opacity is consistent with a large hiatal hernia as seen on prior CT. There is no acute osseous abnormality. IMPRESSION: 1. No radiographic evidence of acute cardiopulmonary process. 2. COPD. 3. Large hiatal hernia. Electronically Signed   By: Valetta Mole M.D.   On: 03/20/2021 13:12   CT Angio Chest Pulmonary Embolism (PE) W or WO Contrast  Result Date: 03/20/2021 CLINICAL DATA:  PE suspected, low/intermediate prob, positive D-dimer Chest pain and shortness of breath EXAM: CT ANGIOGRAPHY CHEST WITH CONTRAST TECHNIQUE: Multidetector CT imaging of the chest was performed using the standard protocol during bolus administration of intravenous contrast. Multiplanar CT image reconstructions and MIPs were obtained to evaluate the vascular anatomy. CONTRAST:  4m OMNIPAQUE IOHEXOL 350 MG/ML SOLN COMPARISON:  Radiograph earlier today.  Cardiac CT 09/23/2019 FINDINGS: Cardiovascular: There are no filling defects within the pulmonary arteries to suggest pulmonary embolus. Next which shows atherosclerosis of the thoracic aorta with tortuosity. No dissection or acute findings. Heart is normal in size. Trace pericardial fluid. Mediastinum/Nodes: Large hiatal hernia with the majority of the stomach being intrathoracic. Esophagus mildly patulous. No enlarged mediastinal or hilar lymph  nodes. No thyroid nodule. Lungs/Pleura: Subpleural consolidation in the periphery of the right upper lobe has some punctate internal calcifications, and appears slightly linear and bandlike on coronal reformats. This is partially included in previous coronary CT and appears grossly stable. There are faint areas of ground-glass opacity in the left lung, including series 6, image 14 at the lung apex, periphery of the upper lobe series 6, image 34 and 51. There is compressive atelectasis in the lower lobes related to hiatal hernia. No pulmonary edema. Small right pleural effusion. Upper Abdomen: Large hiatal hernia. No acute upper abdominal findings. Musculoskeletal: There are no acute or suspicious osseous abnormalities. Mild thoracic spondylosis. Right mastectomy. Review of the MIP images confirms the above findings. IMPRESSION: 1. No pulmonary embolus. 2. Faint areas of ground-glass opacity in the left lung are nonspecific, may be infectious or inflammatory, including COVID-19. Would recommend follow-up as a ground-glass nodules as follows: Non-contrast chest CT at 3-6 months is recommended. If nodules persist, subsequent management will be based upon the most suspicious nodule(s). This recommendation follows the consensus statement: Guidelines for Management of Incidental Pulmonary Nodules Detected on CT Images: From the Fleischner Society 2017; Radiology 2017; 284:228-243. 3. Small right pleural effusion. 4. Subpleural consolidation in the periphery of the right upper lobe has a slightly linear and bandlike appearance and may be scarring. Given right mastectomy findings may be related to post radiation change. Given there are no prior exams to establish stability, this can be assessed on follow-up as recommended above. 5. Large hiatal hernia with the majority of the stomach being intrathoracic. Aortic Atherosclerosis (ICD10-I70.0). Electronically Signed   By: MKeith RakeM.D.   On: 03/20/2021 19:31    EKG:  Independently reviewed.  Sinus rhythm without acute changes  Assessment/Plan Active Problems:   Mixed hyperlipidemia   Asthma   Diaphragmatic hernia   Chest pain   CAD (coronary artery disease)    Acute CHF exacerbation -Clinically, the patient does appear to be volume overloaded -She has had a weight gain of 30 pounds over the past 6 weeks, will this may not all be related to fluid -Last visit with PCP 02/2021, her weight was 172 pounds -Current weight on admission 206 pounds -BNP mildly elevated -We will recheck echocardiogram -Started on intravenous Lasix and monitor intake and output  Dyspnea -Possibly multifactorial -Likely some component of CHF is contributing -She has a history of asthma and her chest x-ray does indicate possible COPD -She will be continued on bronchodilators -D-dimer mildly elevated, will check CTA chest  Chest pain -EKG and troponin unremarkable -Last seen by Dr. Percival Spanish in 11/2019.  Since she did have a history of nonobstructive and distal vessel coronary artery disease, with her persisting symptoms, it was recommended that she proceed with cardiac catheterization.  Patient was unable to have this done due to issues with transportation and COVID restrictions -We will request cardiology input to see if further invasive cardiac work-up is indicated at this time. -Keep n.p.o. after midnight in case further work-up is planned  History of hiatal hernia -Continue on PPI  Asthma/COPD -Continue on Singulair and bronchodilators -She will probably benefit from outpatient pulmonology evaluation for PFTs -She does not report any significant smoking history or exposure to secondhand smoke  Hyperlipidemia -Continue on statin   DVT prophylaxis: Lovenox Code Status: Full code Family Communication: Discussed with son at the bedside Disposition Plan: Discharge home once respiratory status has improved Consults called: Cardiology Admission status: Observation,  telemetry  Kathie Dike MD [facility]   If 7PM-7AM, please contact night-coverage [email or web address]   03/20/2021, 8:37 PM

## 2021-03-20 NOTE — ED Triage Notes (Signed)
Pt c/o chest pain/tightness and shortness of breath starting last night.

## 2021-03-20 NOTE — Telephone Encounter (Signed)
Pt c/o Shortness Of Breath: STAT if SOB developed within the last 24 hours or pt is noticeably SOB on the phone  1. Are you currently SOB (can you hear that pt is SOB on the phone)? yes  2. How long have you been experiencing SOB? Since last night  3. Are you SOB when sitting or when up moving around? both  4. Are you currently experiencing any other symptoms? Chest pain

## 2021-03-20 NOTE — ED Provider Notes (Signed)
[facility] EMERGENCY DEPARTMENT Provider Note   CSN: [id number] Arrival date & time: 03/20/21  1150     History Chief Complaint  Patient presents with   Chest Pain    Krystal [name] is a 80 y.o. female.   Chest Pain Associated symptoms: shortness of breath   Associated symptoms: no abdominal pain, no back pain and no weakness   Patient with chest pain.  Tightness.  Shortness of breath.  Has had episode since last night but states has been having episodes of this for a while.  States her physical activity is limited because of it.  States she cannot do the same things because she will get more short of breath and some chest tightness.  Had previously been seen by cardiology and had a reassuring echocardiogram.  Had a negative stress test 2 years ago.  However it appears that from the echo the plan was to get a heart cath since there was no other cause of the shortness of breath found.  However this was 2 years ago and in the middle of COVID.  States that she did not go because she could not get a ride.Patient also states that she has had more swelling in her ankles recently.    Past Medical History:  Diagnosis Date   Anemia    Asthma    Brain tumor ([place])    1986   Breast cancer ([place])    Right breast   Cataract    Colon polyps    COPD (chronic obstructive pulmonary disease) ([place])    Hiatal hernia    Hyperlipidemia    Leg edema    Macular degeneration    Osteoporosis    Pneumonia    PVD (posterior vitreous detachment)    Right knee pain    Shingles rash     Patient Active Problem List   Diagnosis Date Noted   SOB (shortness of breath) 05/17/2019   Osteoporosis 03/24/2019   Chest pain of uncertain etiology [id number]   Carpal tunnel syndrome of right wrist 03/25/2018   Influenza with pneumonia 10/30/2015   Abdominal aortic atherosclerosis ([place]) 04/10/2015   PSVT (paroxysmal supraventricular tachycardia) ([place]) 05/10/2014   Macular degeneration, age related,  nonexudative 08/24/2013   History of right mastectomy 07/13/2013   Anemia, iron deficiency 06/21/2013   Allergic rhinitis 06/21/2013   Chronic low back pain 06/21/2013   Malignant neoplasm of female breast ([place]) 11/22/2007   Mixed hyperlipidemia 11/22/2007   Anxiety state 11/22/2007   Depression, recurrent ([place]) 11/22/2007   Asthma 11/22/2007   GERD 11/22/2007   DUODENITIS, WITHOUT HEMORRHAGE 08/14/2007   HIATAL HERNIA 08/14/2007   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    Past Surgical History:  Procedure Laterality Date   CATARACT EXTRACTION, BILATERAL     COLONOSCOPY     CRANIECTOMY / CRANIOTOMY FOR EXCISION OF BRAIN TUMOR     Left side brain   MASTECTOMY Right    PARTIAL HYSTERECTOMY     POLYPECTOMY     SKIN CANCER EXCISION Left    Under left eye     OB History     Gravida      Para      Term      Preterm      AB      Living  1      SAB      IAB      Ectopic      Multiple      Live Births  Family History  Problem Relation Age of Onset   Heart disease Mother 70       Stroke    Dementia Mother    Congestive Heart Failure Mother    Osteoporosis Mother    Stroke Mother    Heart disease Sister    Hypertension Sister    Diabetes Sister    Osteoporosis Sister    Heart disease Brother    Hyperlipidemia Brother    Diabetes Brother    Heart disease Sister    Hypertension Sister    Diabetes Sister    Diabetes Sister    Hypertension Sister    Diabetes Sister    Hypertension Sister    Diabetes Brother    Hyperlipidemia Brother    Cancer Father        lung   Heart attack Father    Diabetes Brother    Heart disease Brother    Hyperlipidemia Brother    Diabetes Brother    Diabetes Son    Colon cancer Neg Hx    Rectal cancer Neg Hx    Stomach cancer Neg Hx    Esophageal cancer Neg Hx     Social History   Tobacco Use   Smoking status: Former    Types: Cigarettes    Quit date: 02/09/1961    Years since quitting: 60.1    Smokeless tobacco: Never  Vaping Use   Vaping Use: Never used  Substance Use Topics   Alcohol use: No    Alcohol/week: 0.0 standard drinks   Drug use: No    Home Medications Prior to Admission medications   Medication Sig Start Date End Date Taking? Authorizing Provider  albuterol (VENTOLIN HFA) 108 (90 Base) MCG/ACT inhaler INHALE TWO PUFFS BY MOUTH 4 TIMES DAILY AS NEEDED 03/06/21   Dettinger, Fransisca Kaufmann, MD  aspirin EC 81 MG tablet Take 81 mg by mouth at bedtime.    [provider]  Calcium Citrate-Vitamin D (CALCIUM CITRATE + D) 315-250 MG-UNIT TABS Take 2 tablets by mouth daily. 12/10/15   Cherre Robins, PharmD  cetirizine (ZYRTEC) 10 MG tablet Take 5-10 mg by mouth at bedtime.     [provider]  Cholecalciferol (VITAMIN D) 2000 UNITS CAPS Take 1 capsule by mouth at bedtime.    [provider]  denosumab (PROLIA) 60 MG/ML SOSY injection TO BE ADMINISTERED IN PHYSICIAN'S OFFICE. INJECT ONE SYRINGE SUBCOUSLY ONCE EVERY 6 MONTHS. REFRIGERATE. USE WITHIN 14 DAYS ONCE AT ROOM TEMPERATURE. 09/05/20   Dettinger, Fransisca Kaufmann, MD  diazepam (VALIUM) 5 MG tablet Take 1 tablet (5 mg total) by mouth at bedtime as needed. 11/05/20   Dettinger, Fransisca Kaufmann, MD  diclofenac Sodium (VOLTAREN) 1 % GEL Apply 2 g topically 4 (four) times daily. 11/05/20   Dettinger, Fransisca Kaufmann, MD  diltiazem (CARDIZEM CD) 300 MG 24 hr capsule Take 1 capsule (300 mg total) by mouth daily. 11/05/20   Dettinger, Fransisca Kaufmann, MD  esomeprazole (NEXIUM) 40 MG capsule TAKE 1 CAPSULE BY MOUTH EVERY DAY 11/05/20   Dettinger, Fransisca Kaufmann, MD  ferrous sulfate 325 (65 FE) MG tablet Take 1 tablet (325 mg total) by mouth at bedtime. 11/05/20   Dettinger, Fransisca Kaufmann, MD  ipratropium-albuterol (DUONEB) 0.5-2.5 (3) MG/3ML SOLN Take 3 mLs by nebulization every 6 (six) hours as needed. Dx:  Asthma 493.90 04/03/17   Chipper Herb, MD  metoprolol tartrate (LOPRESSOR) 25 MG tablet TAKE 1 TABLET (25 MG TOTAL) BY MOUTH DAILY AS NEEDED. 11/05/20  Dettinger, Fransisca Kaufmann, MD  montelukast (SINGULAIR) 10 MG tablet One tablet by mouth daily at bedtime 11/05/20   Dettinger, Fransisca Kaufmann, MD  Multiple Vitamins-Minerals (PRESERVISION/LUTEIN PO) Take 1 capsule by mouth at bedtime.     [provider]  nitroGLYCERIN (NITROLINGUAL) 0.4 MG/SPRAY spray ONE SPRAY UNDER TONGUE AS NEED FOR CHESTPAIN. MAY REPEAT IN 5 MINUTES IF PAIN NOT RELIEVED CALL 911. 11/17/18   Minus Breeding, MD  RESTASIS 0.05 % ophthalmic emulsion Place 1 drop into both eyes 2 (two) times daily. 03/05/18   [provider]  simvastatin (ZOCOR) 20 MG tablet TAKE 1 TABLET BY MOUTH EVERYDAY AT BEDTIME 03/06/21   Dettinger, Fransisca Kaufmann, MD    Allergies    Actonel [risedronate], Fosamax [alendronate sodium], Mevacor [lovastatin], Miacalcin [calcitonin (salmon)], and Mobic [meloxicam]  Review of Systems   Review of Systems  Constitutional:  Negative for appetite change.  HENT:  Negative for congestion.   Respiratory:  Positive for shortness of breath.   Cardiovascular:  Positive for chest pain.  Gastrointestinal:  Negative for abdominal pain.  Genitourinary:  Negative for flank pain.  Musculoskeletal:  Negative for back pain.  Neurological:  Negative for weakness.  Psychiatric/Behavioral:  Negative for confusion.    Physical Exam Updated Vital Signs BP 132/72   Pulse 82   Temp 97.7 F (36.5 C) (Oral)   Resp 19   Ht '5\' 3"'$  (1.6 m)   Wt 93.8 kg   SpO2 97%   BMI 36.62 kg/m   Physical Exam Vitals and nursing note reviewed.  HENT:     Head: Normocephalic.  Cardiovascular:     Rate and Rhythm: Normal rate and regular rhythm.     Heart sounds: No murmur heard. Pulmonary:     Effort: No tachypnea.     Breath sounds: No wheezing, rhonchi or rales.  Abdominal:     Tenderness: There is no abdominal tenderness.  Musculoskeletal:     Cervical back: Neck supple.     Right lower leg: Edema present.     Left lower leg: Edema present.     Comments: Pitting edema bilateral  lower extremities.  Skin:    Capillary Refill: Capillary refill takes less than 2 seconds.  Neurological:     Mental Status: She is alert and oriented to person, place, and time.    ED Results / Procedures / Treatments   Labs (all labs ordered are listed, but only abnormal results are displayed) Labs Reviewed  BASIC METABOLIC PANEL - Abnormal; Notable for the following components:      Result Value   Glucose, Bld 104 (*)    Calcium 8.7 (*)    Anion gap 4 (*)    All other components within normal limits  RESP PANEL BY RT-PCR (FLU A&B, COVID) [id number]  CBC  TROPONIN I (HIGH SENSITIVITY)  TROPONIN I (HIGH SENSITIVITY)    EKG EKG Interpretation  Date/Time:  Wednesday March 20 2021 11:58:53 EDT Ventricular Rate:  87 PR Interval:  194 QRS Duration: 60 QT Interval:  366 QTC Calculation: 440 R Axis:   33 Text Interpretation: Normal sinus rhythm Cannot rule out Anterior infarct , age undetermined Abnormal ECG Confirmed by Davonna Belling [phone]) on 03/20/2021 12:09:29 PM  Radiology DG Chest 2 View  Result Date: 03/20/2021 CLINICAL DATA:  Chest pain, tightness, shortness of breath EXAM: CHEST - 2 VIEW COMPARISON:  None. FINDINGS: The cardiomediastinal silhouette is similar to the prior study. There is calcified atherosclerotic plaque of the aortic arch. The lungs  remain hyperinflated consistent with COPD. There is no focal consolidation or pulmonary edema. There is no significant effusion. There is no pneumothorax. Wedge-shaped opacity in the right apex is unchanged going back multiple prior studies and likely reflects scarring. Retrocardiac opacity is consistent with a large hiatal hernia as seen on prior CT. There is no acute osseous abnormality. IMPRESSION: 1. No radiographic evidence of acute cardiopulmonary process. 2. COPD. 3. Large hiatal hernia. Electronically Signed   By: Valetta Mole M.D.   On: 03/20/2021 13:12    Procedures Procedures   Medications Ordered in  ED Medications - No data to display  ED Course  I have reviewed the triage vital signs and the nursing notes.  Pertinent labs & imaging results that were available during my care of the patient were reviewed by me and considered in my medical decision making (see chart for details).    MDM Rules/Calculators/A&P                           Patient with shortness of breath and chest pain.  Has had for the last couple days but states has been dealing with this for a while now.  Reviewing records appears as if she was lined up to likely get a heart catheterization but she was unable to make it.  EKG reassuring.  Troponin negative.  Chest x-ray reassuring.  However with chest pain with previous plan for heart catheterization feels the patient benefit from admission to the hospital.  Will discuss with hospitalist Final Clinical Impression(s) / ED Diagnoses Final diagnoses:  SOB (shortness of breath)  Chest pain, unspecified type    Rx / DC Orders ED Discharge Orders     None        Davonna Belling, MD 03/20/21 1414

## 2021-03-21 ENCOUNTER — Observation Stay (HOSPITAL_COMMUNITY): Payer: Medicare HMO

## 2021-03-21 ENCOUNTER — Inpatient Hospital Stay (HOSPITAL_COMMUNITY)
Admission: EM | Disposition: A | Discharge status: Home Health | Payer: Self-pay | Source: Home / Self Care | Attending: Internal Medicine

## 2021-03-21 DIAGNOSIS — E782 Mixed hyperlipidemia: Secondary | ICD-10-CM | POA: Diagnosis not present

## 2021-03-21 DIAGNOSIS — I25110 Atherosclerotic heart disease of native coronary artery with unstable angina pectoris: Secondary | ICD-10-CM | POA: Diagnosis not present

## 2021-03-21 DIAGNOSIS — Z20822 Contact with and (suspected) exposure to covid-19: Secondary | ICD-10-CM | POA: Diagnosis not present

## 2021-03-21 DIAGNOSIS — Z86010 Personal history of colonic polyps: Secondary | ICD-10-CM | POA: Diagnosis not present

## 2021-03-21 DIAGNOSIS — I352 Nonrheumatic aortic (valve) stenosis with insufficiency: Secondary | ICD-10-CM | POA: Diagnosis not present

## 2021-03-21 DIAGNOSIS — R0602 Shortness of breath: Secondary | ICD-10-CM | POA: Diagnosis not present

## 2021-03-21 DIAGNOSIS — Z85828 Personal history of other malignant neoplasm of skin: Secondary | ICD-10-CM | POA: Diagnosis not present

## 2021-03-21 DIAGNOSIS — I2510 Atherosclerotic heart disease of native coronary artery without angina pectoris: Secondary | ICD-10-CM | POA: Diagnosis not present

## 2021-03-21 DIAGNOSIS — I200 Unstable angina: Secondary | ICD-10-CM

## 2021-03-21 DIAGNOSIS — R0609 Other forms of dyspnea: Secondary | ICD-10-CM

## 2021-03-21 DIAGNOSIS — I5041 Acute combined systolic (congestive) and diastolic (congestive) heart failure: Secondary | ICD-10-CM | POA: Diagnosis not present

## 2021-03-21 DIAGNOSIS — I350 Nonrheumatic aortic (valve) stenosis: Secondary | ICD-10-CM | POA: Diagnosis not present

## 2021-03-21 DIAGNOSIS — E785 Hyperlipidemia, unspecified: Secondary | ICD-10-CM | POA: Diagnosis not present

## 2021-03-21 DIAGNOSIS — Z9011 Acquired absence of right breast and nipple: Secondary | ICD-10-CM | POA: Diagnosis not present

## 2021-03-21 DIAGNOSIS — Z90711 Acquired absence of uterus with remaining cervical stump: Secondary | ICD-10-CM | POA: Diagnosis not present

## 2021-03-21 DIAGNOSIS — Z79899 Other long term (current) drug therapy: Secondary | ICD-10-CM | POA: Diagnosis not present

## 2021-03-21 DIAGNOSIS — Z87891 Personal history of nicotine dependence: Secondary | ICD-10-CM | POA: Diagnosis not present

## 2021-03-21 DIAGNOSIS — H353190 Nonexudative age-related macular degeneration, unspecified eye, stage unspecified: Secondary | ICD-10-CM | POA: Diagnosis present

## 2021-03-21 DIAGNOSIS — I509 Heart failure, unspecified: Secondary | ICD-10-CM

## 2021-03-21 DIAGNOSIS — I4892 Unspecified atrial flutter: Secondary | ICD-10-CM | POA: Diagnosis not present

## 2021-03-21 DIAGNOSIS — I471 Supraventricular tachycardia: Secondary | ICD-10-CM | POA: Diagnosis not present

## 2021-03-21 DIAGNOSIS — Q211 Atrial septal defect: Secondary | ICD-10-CM | POA: Diagnosis not present

## 2021-03-21 DIAGNOSIS — I480 Paroxysmal atrial fibrillation: Secondary | ICD-10-CM | POA: Diagnosis present

## 2021-03-21 DIAGNOSIS — M810 Age-related osteoporosis without current pathological fracture: Secondary | ICD-10-CM | POA: Diagnosis present

## 2021-03-21 DIAGNOSIS — Z83438 Family history of other disorder of lipoprotein metabolism and other lipidemia: Secondary | ICD-10-CM | POA: Diagnosis not present

## 2021-03-21 DIAGNOSIS — Z7982 Long term (current) use of aspirin: Secondary | ICD-10-CM | POA: Diagnosis not present

## 2021-03-21 DIAGNOSIS — Z853 Personal history of malignant neoplasm of breast: Secondary | ICD-10-CM | POA: Diagnosis not present

## 2021-03-21 DIAGNOSIS — Z8262 Family history of osteoporosis: Secondary | ICD-10-CM | POA: Diagnosis not present

## 2021-03-21 DIAGNOSIS — J449 Chronic obstructive pulmonary disease, unspecified: Secondary | ICD-10-CM | POA: Diagnosis not present

## 2021-03-21 DIAGNOSIS — K449 Diaphragmatic hernia without obstruction or gangrene: Secondary | ICD-10-CM | POA: Diagnosis present

## 2021-03-21 DIAGNOSIS — I700 Atherosclerosis of aorta: Secondary | ICD-10-CM | POA: Diagnosis not present

## 2021-03-21 HISTORY — PX: LEFT HEART CATH AND CORONARY ANGIOGRAPHY: CATH118249

## 2021-03-21 HISTORY — PX: CORONARY PRESSURE/FFR WITH 3D MAPPING: CATH118309

## 2021-03-21 LAB — BASIC METABOLIC PANEL
Anion gap: 7 (ref 5–15)
BUN: 14 mg/dL (ref 8–23)
CO2: 29 mmol/L (ref 22–32)
Calcium: 8.7 mg/dL — ABNORMAL LOW (ref 8.9–10.3)
Chloride: 103 mmol/L (ref 98–111)
Creatinine, Ser: 0.64 mg/dL (ref 0.44–1.00)
GFR, Estimated: >60 mL/min (ref >60)
Glucose, Bld: 92 mg/dL (ref 70–99)
Potassium: 4.2 mmol/L (ref 3.5–5.1)
Sodium: 139 mmol/L (ref 135–145)

## 2021-03-21 LAB — ECHOCARDIOGRAM COMPLETE
AR max vel: 1.47 cm2
AV Area VTI: 1.41 cm2
AV Area mean vel: 1.42 cm2
AV Mean grad: 9.5 mmHg
AV Peak grad: 18.4 mmHg
Ao pk vel: 2.15 m/s
Area-P 1/2: 4.41 cm2
Height: 63 in
MV VTI: 3.21 cm2
P 1/2 time: 330 msec
S' Lateral: 2.38 cm
Weight: 2894.2 oz

## 2021-03-21 LAB — POCT ACTIVATED CLOTTING TIME: Activated Clotting Time: 254 seconds

## 2021-03-21 SURGERY — LEFT HEART CATH AND CORONARY ANGIOGRAPHY
Anesthesia: LOCAL

## 2021-03-21 MED ORDER — SODIUM CHLORIDE 0.9% FLUSH
3.0000 mL | Freq: Two times a day (BID) | INTRAVENOUS | Status: DC
  Administered 2021-03-21 (×2): 3 mL via INTRAVENOUS

## 2021-03-21 MED ORDER — HEPARIN (PORCINE) IN NACL 1000-0.9 UT/500ML-% IV SOLN
INTRAVENOUS | Status: DC | PRN
  Administered 2021-03-21 (×2): 500 mL

## 2021-03-21 MED ORDER — MIDAZOLAM HCL 2 MG/2ML IJ SOLN
INTRAMUSCULAR | Status: DC | PRN
  Administered 2021-03-21: 0.5 mg via INTRAVENOUS

## 2021-03-21 MED ORDER — FENTANYL CITRATE (PF) 100 MCG/2ML IJ SOLN
INTRAMUSCULAR | Status: DC | PRN
  Administered 2021-03-21: 12.5 ug via INTRAVENOUS

## 2021-03-21 MED ORDER — ASPIRIN EC 81 MG PO TBEC
81.0000 mg | DELAYED_RELEASE_TABLET | Freq: Every day | ORAL | Status: DC
  Administered 2021-03-21: 81 mg via ORAL
  Filled 2021-03-21: qty 1

## 2021-03-21 MED ORDER — NITROGLYCERIN 1 MG/10 ML FOR IR/CATH LAB
INTRA_ARTERIAL | Status: AC
  Filled 2021-03-21: qty 10

## 2021-03-21 MED ORDER — SODIUM CHLORIDE 0.9 % IV SOLN: INTRAVENOUS | Status: DC

## 2021-03-21 MED ORDER — MIDAZOLAM HCL 2 MG/2ML IJ SOLN
INTRAMUSCULAR | Status: AC
  Filled 2021-03-21: qty 2

## 2021-03-21 MED ORDER — SODIUM CHLORIDE 0.9 % IV SOLN: 250.0000 mL | INTRAVENOUS | Status: DC | PRN

## 2021-03-21 MED ORDER — ENOXAPARIN SODIUM 40 MG/0.4ML IJ SOSY: 40.0000 mg | PREFILLED_SYRINGE | INTRAMUSCULAR | Status: DC

## 2021-03-21 MED ORDER — HEPARIN (PORCINE) 25000 UT/250ML-% IV SOLN
1000.0000 [IU]/h | INTRAVENOUS | Status: DC
  Administered 2021-03-21: 1000 [IU]/h via INTRAVENOUS
  Filled 2021-03-21: qty 250

## 2021-03-21 MED ORDER — HEPARIN SODIUM (PORCINE) 1000 UNIT/ML IJ SOLN
INTRAMUSCULAR | Status: AC
  Filled 2021-03-21: qty 1

## 2021-03-21 MED ORDER — HEPARIN (PORCINE) IN NACL 1000-0.9 UT/500ML-% IV SOLN
INTRAVENOUS | Status: AC
  Filled 2021-03-21: qty 1000

## 2021-03-21 MED ORDER — IOHEXOL 350 MG/ML SOLN
INTRAVENOUS | Status: DC | PRN
  Administered 2021-03-21: 50 mL via INTRA_ARTERIAL

## 2021-03-21 MED ORDER — SODIUM CHLORIDE 0.9% FLUSH
3.0000 mL | Freq: Two times a day (BID) | INTRAVENOUS | Status: DC
  Administered 2021-03-22: 3 mL via INTRAVENOUS

## 2021-03-21 MED ORDER — LIDOCAINE HCL (PF) 1 % IJ SOLN
INTRAMUSCULAR | Status: DC | PRN
  Administered 2021-03-21: 2 mL via INTRADERMAL

## 2021-03-21 MED ORDER — LIVING BETTER WITH HEART FAILURE BOOK: Freq: Once | Status: AC

## 2021-03-21 MED ORDER — SODIUM CHLORIDE 0.9% FLUSH: 3.0000 mL | INTRAVENOUS | Status: DC | PRN

## 2021-03-21 MED ORDER — LIDOCAINE HCL (PF) 1 % IJ SOLN
INTRAMUSCULAR | Status: AC
  Filled 2021-03-21: qty 30

## 2021-03-21 MED ORDER — HEPARIN (PORCINE) IN NACL 1000-0.9 UT/500ML-% IV SOLN
INTRAVENOUS | Status: AC
  Filled 2021-03-21: qty 500

## 2021-03-21 MED ORDER — VERAPAMIL HCL 2.5 MG/ML IV SOLN
INTRAVENOUS | Status: DC | PRN
  Administered 2021-03-21: 10 mL via INTRA_ARTERIAL

## 2021-03-21 MED ORDER — LABETALOL HCL 5 MG/ML IV SOLN: 10.0000 mg | INTRAVENOUS | Status: AC | PRN

## 2021-03-21 MED ORDER — HYDRALAZINE HCL 20 MG/ML IJ SOLN: 10.0000 mg | INTRAMUSCULAR | Status: AC | PRN

## 2021-03-21 MED ORDER — HEPARIN SODIUM (PORCINE) 1000 UNIT/ML IJ SOLN
INTRAMUSCULAR | Status: DC | PRN
  Administered 2021-03-21: 4000 [IU] via INTRAVENOUS

## 2021-03-21 MED ORDER — FENTANYL CITRATE (PF) 100 MCG/2ML IJ SOLN
INTRAMUSCULAR | Status: AC
  Filled 2021-03-21: qty 2

## 2021-03-21 MED ORDER — VERAPAMIL HCL 2.5 MG/ML IV SOLN
INTRAVENOUS | Status: AC
  Filled 2021-03-21: qty 2

## 2021-03-21 SURGICAL SUPPLY — 12 items
CATH 5FR JL3.5 JR4 ANG PIG MP (CATHETERS) ×1 IMPLANT
CATH LAUNCHER 5F EBU3.0 (CATHETERS) IMPLANT
CATHETER LAUNCHER 5F EBU3.0 (CATHETERS) ×2
DEVICE RAD COMP TR BAND LRG (VASCULAR PRODUCTS) ×1 IMPLANT
GLIDESHEATH SLEND SS 6F .021 (SHEATH) ×1 IMPLANT
GUIDEWIRE INQWIRE 1.5J.035X260 (WIRE) IMPLANT
GUIDEWIRE PRESSURE X 175 (WIRE) ×1 IMPLANT
INQWIRE 1.5J .035X260CM (WIRE) ×2
KIT HEART LEFT (KITS) ×2 IMPLANT
PACK CARDIAC CATHETERIZATION (CUSTOM PROCEDURE TRAY) ×2 IMPLANT
TRANSDUCER W/STOPCOCK (MISCELLANEOUS) ×2 IMPLANT
TUBING CIL FLEX 10 FLL-RA (TUBING) ×2 IMPLANT

## 2021-03-21 NOTE — Progress Notes (Signed)
ANTICOAGULATION CONSULT NOTE - Follow Up Consult  Pharmacy Consult for Heparin Indication: atrial fibrillation  Allergies  Allergen Reactions   Actonel [Risedronate] Nausea And Vomiting and Other (See Comments)    Throat tightness   Fosamax [Alendronate Sodium] Other (See Comments)    esophagitis   Mevacor [Lovastatin] Other (See Comments)    myalgias   Miacalcin [Calcitonin ([place])] Nausea And Vomiting   Mobic [Meloxicam] Hives and Itching    rash    Patient Measurements: Height: '5\' 3"'$  (160 cm) Weight: 82.1 kg (180 lb 14.2 oz) IBW/kg (Calculated) : 52.4 Heparin Dosing Weight: 70kg  Vital Signs: BP: 123/68 (08/18 1605) Pulse Rate: 88 (08/18 1605)  Labs: Recent Labs    03/20/21 1226 03/20/21 1351 03/21/21 0614  HGB 12.3  --   --   HCT 39.3  --   --   PLT 166  --   --   CREATININE 0.75  --  0.64  TROPONINIHS 6 6  --     Estimated Creatinine Clearance: 56 mL/min (by C-G formula based on SCr of 0.64 mg/dL).    Assessment: 80 yo W with new Afib (CHADS2VASc = 4) s/p left heart cath 8/18. No anticoagulation prior to admission. Pharmacy consulted for heparin to start 2 hours after TR band removal.   TR band removed at ~20:45.  Last enoxaparin given 8/17 at 2200.  Goal of Therapy:  Heparin level 0.3-0.7 units/ml Monitor platelets by anticoagulation protocol: Yes   Plan:   Start heparin 1000 units/hr at 2300  Monitor daily HL, CBC/plt Monitor for signs/symptoms of bleeding  F/u switch to DOAC if stable 8/19   Benetta Spar, PharmD, BCPS, [facility] Clinical Pharmacist  Please check AMION for all [place] phone numbers After 10:00 PM, call [place] [phone]

## 2021-03-21 NOTE — Consult Note (Addendum)
Cardiology Consultation:   Patient ID: Krystal [name] MRN: [id number]; DOB: [birth year]  Admit date: 03/20/2021 Date of Consult: 03/21/2021  PCP:  Dettinger, Fransisca Kaufmann, MD   [facility] Providers Cardiologist:  Minus Breeding, MD        Patient Profile:   Krystal [name] is a 80 y.o. female with a past medical history of CAD (s/p Coronary CT in 09/2019 with FFR significant along distal LAD and medically managed at that time), SVT, breast cancer (s/p mastectomy) and HLD who is being seen 03/21/2021 for the evaluation of chest pain at the request of Dr. Roderic Palau.  History of Present Illness:   Ms. Schoenborn was last examined by Dr. Percival Spanish in 11/2019 and reported worsening chest pain and dyspnea on exertion at that time which would resolve with sublingual nitroglycerin. She did have a known hiatal hernia but given her progressive symptoms, cardiac catheterization was recommended for definitive evaluation. It appears this was never performed and she has not followed up with cardiology since.  She called the office yesterday reporting episodes of chest pain and was advised to go to the ED for further evaluation. In talking with the patient and her son today, she reports having intermittent episodes of chest tightness along with dyspnea on exertion for the past year. She reports it feels like her "bra is too tight" and this can occur with activity but has also awakened her from sleep at night. Uses NTG spray with improvement in her symptoms. She is more short of breath with activity and her son mentions she does not walk around the grocery store or [place] like she use to do due to her symptoms. He also thinks her leg weakness might be contributing as well. She denies any specific orthopnea or PND but has noticed worsening lower extremity edema over the past week. Denies any changes in fluid consumption. Reports following a low-sodium diet.  Initial labs show WBC 5.7, Hgb 12.3, platelets 166, Na+  137, K+ 4.1 and creatinine 0.75.  BNP 172. D-dimer 0.95. Initial and Hs Troponin negative at 6. Covid negative. CXR showing COPD and large hiatal hernia but no acute findings. CTA showed no evidence of a PE but she was noted to have areas of groundglass opacity in the left lung with follow-up CT recommended in 3 to 6 months. Was also noted to have a known large hiatal hernia with the majority of the stomach being intrathoracic. EKG shows NSR, HR 87 with no acute ST changes when compared to prior tracings.     Past Medical History:  Diagnosis Date   Anemia    Asthma    Brain tumor ([place])    1986   Breast cancer ([place])    Right breast   Cataract    Colon polyps    COPD (chronic obstructive pulmonary disease) (HCC)    Hiatal hernia    Hyperlipidemia    Leg edema    Macular degeneration    Osteoporosis    Pneumonia    PVD (posterior vitreous detachment)    Right knee pain    Shingles rash     Past Surgical History:  Procedure Laterality Date   CATARACT EXTRACTION, BILATERAL     COLONOSCOPY     CRANIECTOMY / CRANIOTOMY FOR EXCISION OF BRAIN TUMOR     Left side brain   MASTECTOMY Right    PARTIAL HYSTERECTOMY     POLYPECTOMY     SKIN CANCER EXCISION Left    Under left eye  Home Medications:  Prior to Admission medications   Medication Sig Start Date End Date Taking? Authorizing Provider  albuterol (VENTOLIN HFA) 108 (90 Base) MCG/ACT inhaler INHALE TWO PUFFS BY MOUTH 4 TIMES DAILY AS NEEDED Patient taking differently: Inhale 2 puffs into the lungs every 4 (four) hours as needed for wheezing or shortness of breath. 03/06/21  Yes Dettinger, Fransisca Kaufmann, MD  aspirin EC 81 MG tablet Take 81 mg by mouth at bedtime.   Yes [provider]  Calcium Citrate-Vitamin D (CALCIUM CITRATE + D) 315-250 MG-UNIT TABS Take 2 tablets by mouth daily. 12/10/15  Yes Cherre Robins, PharmD  cetirizine (ZYRTEC) 10 MG tablet Take 5-10 mg by mouth at bedtime.    Yes [provider]   Cholecalciferol (VITAMIN D) 2000 UNITS CAPS Take 1 capsule by mouth at bedtime.   Yes [provider]  denosumab (PROLIA) 60 MG/ML SOSY injection TO BE ADMINISTERED IN PHYSICIAN'S OFFICE. INJECT ONE SYRINGE SUBCOUSLY ONCE EVERY 6 MONTHS. REFRIGERATE. USE WITHIN 14 DAYS ONCE AT ROOM TEMPERATURE. 09/05/20  Yes Dettinger, Fransisca Kaufmann, MD  diazepam (VALIUM) 5 MG tablet Take 1 tablet (5 mg total) by mouth at bedtime as needed. 11/05/20  Yes Dettinger, Fransisca Kaufmann, MD  diltiazem (CARDIZEM CD) 300 MG 24 hr capsule Take 1 capsule (300 mg total) by mouth daily. 11/05/20  Yes Dettinger, Fransisca Kaufmann, MD  esomeprazole (NEXIUM) 40 MG capsule TAKE 1 CAPSULE BY MOUTH EVERY DAY 11/05/20  Yes Dettinger, Fransisca Kaufmann, MD  ferrous sulfate 325 (65 FE) MG tablet Take 1 tablet (325 mg total) by mouth at bedtime. 11/05/20  Yes Dettinger, Fransisca Kaufmann, MD  ipratropium-albuterol (DUONEB) 0.5-2.5 (3) MG/3ML SOLN Take 3 mLs by nebulization every 6 (six) hours as needed. Dx:  Asthma 493.90 Patient taking differently: Take 3 mLs by nebulization every 6 (six) hours as needed. Dx:  Asthma 493.90 04/03/17  Yes Chipper Herb, MD  montelukast (SINGULAIR) 10 MG tablet One tablet by mouth daily at bedtime 11/05/20  Yes Dettinger, Fransisca Kaufmann, MD  Multiple Vitamins-Minerals (PRESERVISION/LUTEIN PO) Take 1 capsule by mouth at bedtime.    Yes [provider]  nitroGLYCERIN (NITROLINGUAL) 0.4 MG/SPRAY spray ONE SPRAY UNDER TONGUE AS NEED FOR CHESTPAIN. MAY REPEAT IN 5 MINUTES IF PAIN NOT RELIEVED CALL 911. 11/17/18  Yes Hochrein, Jeneen Rinks, MD  Polyethyl Glycol-Propyl Glycol (SYSTANE) 0.4-0.3 % SOLN Place 1 drop into both eyes 2 (two) times daily.   Yes [provider]  simvastatin (ZOCOR) 20 MG tablet TAKE 1 TABLET BY MOUTH EVERYDAY AT BEDTIME Patient taking differently: Take 20 mg by mouth daily at 6 PM. 03/06/21  Yes Dettinger, Fransisca Kaufmann, MD  diclofenac Sodium (VOLTAREN) 1 % GEL Apply 2 g topically 4 (four) times daily. Patient not taking: No sig  reported 11/05/20   Dettinger, Fransisca Kaufmann, MD  metoprolol tartrate (LOPRESSOR) 25 MG tablet TAKE 1 TABLET (25 MG TOTAL) BY MOUTH DAILY AS NEEDED. 11/05/20   Dettinger, Fransisca Kaufmann, MD  RESTASIS 0.05 % ophthalmic emulsion Place 1 drop into both eyes 2 (two) times daily. Patient not taking: Reported on 03/20/2021 03/05/18   [provider]    Inpatient Medications: Scheduled Meds:  aspirin EC  81 mg Oral QHS   cycloSPORINE  1 drop Both Eyes BID   diclofenac Sodium  2 g Topical QID   diltiazem  300 mg Oral Daily   enoxaparin (LOVENOX) injection  40 mg Subcutaneous Q24H   ipratropium-albuterol  3 mL Nebulization TID   montelukast  10  mg Oral QHS   pantoprazole  40 mg Oral Daily   simvastatin  20 mg Oral q1800   sodium chloride flush  3 mL Intravenous Q12H   sodium chloride flush  3 mL Intravenous Q12H   Continuous Infusions:  sodium chloride     sodium chloride     PRN Meds: sodium chloride, acetaminophen, acetaminophen, diazepam, ondansetron (ZOFRAN) IV, ondansetron (ZOFRAN) IV, sodium chloride flush  Allergies:    Allergies  Allergen Reactions   Actonel [Risedronate] Nausea And Vomiting and Other (See Comments)    Throat tightness   Fosamax [Alendronate Sodium] Other (See Comments)    esophagitis   Mevacor [Lovastatin] Other (See Comments)    myalgias   Miacalcin [Calcitonin ([place])] Nausea And Vomiting   Mobic [Meloxicam] Hives and Itching    rash    Social History:   Social History   Socioeconomic History   Marital status: Widowed    Spouse name: Not on file   Number of children: 1   Years of education: 7th   Highest education level: 7th grade  Occupational History   Occupation: retired  Tobacco Use   Smoking status: Former    Types: Cigarettes    Quit date: 02/09/1961    Years since quitting: 60.1   Smokeless tobacco: Never  Vaping Use   Vaping Use: Never used  Substance and Sexual Activity   Alcohol use: No    Alcohol/week: 0.0 standard drinks   Drug use:  No   Sexual activity: Not Currently  Other Topics Concern   Not on file  Social History Narrative   Son lives with patient.   Retired from farming.   Highest level of education:  7th   Social Determinants of Health   Financial Resource Strain: Low Risk    Difficulty of Paying Living Expenses: Not hard at all  Food Insecurity: No Food Insecurity   Worried About Charity fundraiser in the Last Year: Never true   Arboriculturist in the Last Year: Never true  Transportation Needs: No Transportation Needs   Lack of Transportation (Medical): No   Lack of Transportation (Non-Medical): No  Physical Activity: Inactive   Days of Exercise per Week: 0 days   Minutes of Exercise per Session: 0 min  Stress: No Stress Concern Present   Feeling of Stress : Not at all  Social Connections: Moderately Integrated   Frequency of Communication with Friends and Family: More than three times a week   Frequency of Social Gatherings with Friends and Family: More than three times a week   Attends Religious Services: More than 4 times per year   Active Member of [employer] or Organizations: Yes   Attends Archivist Meetings: More than 4 times per year   Marital Status: Widowed  Human resources officer Violence: Not At Risk   Fear of Current or Ex-Partner: No   Emotionally Abused: No   Physically Abused: No   Sexually Abused: No    Family History:    Family History  Problem Relation Age of Onset   Heart disease Mother 55       Stroke    Dementia Mother    Congestive Heart Failure Mother    Osteoporosis Mother    Stroke Mother    Heart disease Sister    Hypertension Sister    Diabetes Sister    Osteoporosis Sister    Heart disease Brother    Hyperlipidemia Brother    Diabetes Brother  Heart disease Sister    Hypertension Sister    Diabetes Sister    Diabetes Sister    Hypertension Sister    Diabetes Sister    Hypertension Sister    Diabetes Brother    Hyperlipidemia Brother    Cancer  Father        lung   Heart attack Father    Diabetes Brother    Heart disease Brother    Hyperlipidemia Brother    Diabetes Brother    Diabetes Son    Colon cancer Neg Hx    Rectal cancer Neg Hx    Stomach cancer Neg Hx    Esophageal cancer Neg Hx      ROS:  Please see the history of present illness.   All other ROS reviewed and negative.     Physical Exam/Data:   Vitals:   03/20/21 2024 03/21/21 0403 03/21/21 0500 03/21/21 0830  BP:  117/66  (!) 148/69  Pulse:  88    Resp:  19    Temp:  98 F (36.7 C)    TempSrc:      SpO2: 95% 96%    Weight:   82.1 kg   Height:       No intake or output data in the 24 hours ending 03/21/21 1051 Last 3 Weights 03/21/2021 03/20/2021 03/20/2021  Weight (lbs) 180 lb 14.2 oz 180 lb 14.4 oz 206 lb 11.2 oz  Weight (kg) 82.05 kg 82.056 kg 93.759 kg     Body mass index is 32.04 kg/m.  General:  Well nourished, well developed female appearing in no acute distress. HEENT: normal Lymph: no adenopathy Neck: no JVD Endocrine:  No thryomegaly Vascular: No carotid bruits; FA pulses 2+ bilaterally without bruits  Cardiac:  normal S1, S2; RRR; 2/6 SEM along RUSB. Lungs:  Scattered rhonchi. No wheezing or rales Abd: soft, nontender, no hepatomegaly  Ext: 1+ pitting edema bilaterally. Musculoskeletal:  No deformities, BUE and BLE strength normal and equal Skin: warm and dry  Neuro:  CNs 2-12 intact, no focal abnormalities noted Psych:  Normal affect   EKG:  The EKG was personally reviewed and demonstrates: NSR, HR 87 with no acute ST changes when compared to prior tracings.   Telemetry:  Telemetry was personally reviewed and demonstrates: NSR, HR in 70's to 80's with occasional PVC's.   Relevant CV Studies:  Echocardiogram: 05/2019 IMPRESSIONS     1. Left ventricular ejection fraction, by visual estimation, is 60 to  65%. The left ventricle has normal function. Normal left ventricular size.  There is no left ventricular hypertrophy.    2. Elevated left ventricular end-diastolic pressure.   3. Left ventricular diastolic Doppler parameters are consistent with  pseudonormalization pattern of LV diastolic filling.   4. Global right ventricle has low normal systolic function.The right  ventricular size is normal. No increase in right ventricular wall  thickness.   5. Left atrial size was normal.   6. Right atrial size was normal.   7. Mild mitral annular calcification.   8. Moderate aortic valve annular calcification.   9. The mitral valve is degenerative. Mild mitral valve regurgitation.  10. The tricuspid valve is grossly normal. Tricuspid valve regurgitation  moderate.  11. The aortic valve is tricuspid Aortic valve regurgitation is moderate  by color flow Doppler. Mild to moderate aortic valve stenosis.  12. There is Mild calcification of the aortic valve.  13. There is Mild thickening of the aortic valve.  14. The pulmonic valve  was not well visualized. Pulmonic valve  regurgitation is not visualized by color flow Doppler.  15. Normal pulmonary artery systolic pressure.  16. The inferior vena cava is normal in size with greater than 50%  respiratory variability, suggesting right atrial pressure of 3 mmHg.   Coronary CT: 09/2019 Coronary Arteries:  Normal coronary origin.  Right dominance.   RCA is a large dominant artery that gives rise to PDA and two PLV branches. There is diffuse calcification with minimal (<25%) obstruction proximally and moderate (50-69%) mixed plaque in the mid RCA. There is no plaque in the distal RCA or PDA.   Left main is a large artery that gives rise to LAD, RI, and LCX arteries. Minimal (<25%) calcified plaque at the ostium.   LAD is a large vessel that has minimal (<25%) eccentric, calcified plaque at the ostium.   LCX is a non-dominant artery that gives rise to one large OM1 branch. There is no plaque. The LCX is very small distal to OM1.   RI is a small vessel without plaque.    Other findings:   Normal pulmonary vein drainage into the left atrium.   Normal let atrial appendage without a thrombus.   Normal size of the pulmonary artery.   Mild mitral annular calcification.   Small PFO with right-to-left shunting.   IMPRESSION: IMPRESSION 1. Coronary calcium score of 536. This was 81st percentile for age and sex matched control.   2. Normal coronary origin with right dominance.   3.  Moderate (CAD-RADS 3) mixed plaque in the mid RCA.   4.  Non-obstructing plaque in the LM and LAD.   5.  Will send study for FFRct.  IMPRESSION: 1.  FFRct demonstrates possible ischemia in the distal LAD.   2.  No hemodynamically significant stenosis in the LM, LCX or RCA.   3. Recommend medical management and aggressive risk factor modification.   Note: These examples are not recommendations of [employer] and only provided as examples of what other customers are doing.     Laboratory Data:  High Sensitivity Troponin:   Recent Labs  Lab 03/20/21 1226 03/20/21 1351  TROPONINIHS 6 6     Chemistry Recent Labs  Lab 03/20/21 1226 03/21/21 0614  NA 137 139  K 4.1 4.2  CL 107 103  CO2 26 29  GLUCOSE 104* 92  BUN 22 14  CREATININE 0.75 0.64  CALCIUM 8.7* 8.7*  GFRNONAA >60 >60  ANIONGAP 4* 7    No results for input(s): PROT, ALBUMIN, AST, ALT, ALKPHOS, BILITOT in the last 168 hours. Hematology Recent Labs  Lab 03/20/21 1226  WBC 5.7  RBC 4.15  HGB 12.3  HCT 39.3  MCV 94.7  MCH 29.6  MCHC 31.3  RDW 13.5  PLT 166   BNP Recent Labs  Lab 03/20/21 1226  BNP 172.0*    DDimer  Recent Labs  Lab 03/20/21 1226  DDIMER 0.95*     Radiology/Studies:  DG Chest 2 View  Result Date: 03/20/2021 CLINICAL DATA:  Chest pain, tightness, shortness of breath EXAM: CHEST - 2 VIEW COMPARISON:  None. FINDINGS: The cardiomediastinal silhouette is similar to the prior study. There is calcified atherosclerotic plaque of the aortic arch. The lungs remain  hyperinflated consistent with COPD. There is no focal consolidation or pulmonary edema. There is no significant effusion. There is no pneumothorax. Wedge-shaped opacity in the right apex is unchanged going back multiple prior studies and likely reflects scarring. Retrocardiac opacity is consistent with a  large hiatal hernia as seen on prior CT. There is no acute osseous abnormality. IMPRESSION: 1. No radiographic evidence of acute cardiopulmonary process. 2. COPD. 3. Large hiatal hernia. Electronically Signed   By: Valetta Mole M.D.   On: 03/20/2021 13:12   CT Angio Chest Pulmonary Embolism (PE) W or WO Contrast  Result Date: 03/20/2021 CLINICAL DATA:  PE suspected, low/intermediate prob, positive D-dimer Chest pain and shortness of breath EXAM: CT ANGIOGRAPHY CHEST WITH CONTRAST TECHNIQUE: Multidetector CT imaging of the chest was performed using the standard protocol during bolus administration of intravenous contrast. Multiplanar CT image reconstructions and MIPs were obtained to evaluate the vascular anatomy. CONTRAST:  55m OMNIPAQUE IOHEXOL 350 MG/ML SOLN COMPARISON:  Radiograph earlier today.  Cardiac CT 09/23/2019 FINDINGS: Cardiovascular: There are no filling defects within the pulmonary arteries to suggest pulmonary embolus. Next which shows atherosclerosis of the thoracic aorta with tortuosity. No dissection or acute findings. Heart is normal in size. Trace pericardial fluid. Mediastinum/Nodes: Large hiatal hernia with the majority of the stomach being intrathoracic. Esophagus mildly patulous. No enlarged mediastinal or hilar lymph nodes. No thyroid nodule. Lungs/Pleura: Subpleural consolidation in the periphery of the right upper lobe has some punctate internal calcifications, and appears slightly linear and bandlike on coronal reformats. This is partially included in previous coronary CT and appears grossly stable. There are faint areas of ground-glass opacity in the left lung, including series 6,  image 14 at the lung apex, periphery of the upper lobe series 6, image 34 and 51. There is compressive atelectasis in the lower lobes related to hiatal hernia. No pulmonary edema. Small right pleural effusion. Upper Abdomen: Large hiatal hernia. No acute upper abdominal findings. Musculoskeletal: There are no acute or suspicious osseous abnormalities. Mild thoracic spondylosis. Right mastectomy. Review of the MIP images confirms the above findings. IMPRESSION: 1. No pulmonary embolus. 2. Faint areas of ground-glass opacity in the left lung are nonspecific, may be infectious or inflammatory, including COVID-19. Would recommend follow-up as a ground-glass nodules as follows: Non-contrast chest CT at 3-6 months is recommended. If nodules persist, subsequent management will be based upon the most suspicious nodule(s). This recommendation follows the consensus statement: Guidelines for Management of Incidental Pulmonary Nodules Detected on CT Images: From the Fleischner Society 2017; Radiology 2017; 284:228-243. 3. Small right pleural effusion. 4. Subpleural consolidation in the periphery of the right upper lobe has a slightly linear and bandlike appearance and may be scarring. Given right mastectomy findings may be related to post radiation change. Given there are no prior exams to establish stability, this can be assessed on follow-up as recommended above. 5. Large hiatal hernia with the majority of the stomach being intrathoracic. Aortic Atherosclerosis (ICD10-I70.0). Electronically Signed   By: MKeith RakeM.D.   On: 03/20/2021 19:31     Assessment and Plan:   1. Chest Pain/CAD - Reports initially presenting with lower extremity edema but also mentions episodes of chest tightness and worsening dyspnea on exertion occurring for the past year which have been responsive to NTG. - Hs Troponin values have been negative and her EKG is without acute ST changes. Echo pending. She does have known CAD with prior  Coronary CT in 09/2019 showing moderate plaque along the RCA and FFR was significant along distal LAD and medically managed at that time. Eventually a cath was recommended due to progressive symptoms but she never scheduled the procedure due to COVID-19 and transportation issues.  She is now willing to proceed with a cardiac catheterization  for definitive evaluation. The patient understands that risks include but are not limited to stroke (1 in 1000), death (1 in 6), kidney failure [usually temporary] (1 in 500), bleeding (1 in 200), allergic reaction [possibly serious] (1 in 200).   - Continue ASA '81mg'$  daily and Simvastatin '20mg'$  daily (may need to switch to a different statin as outlined below). Would not start Heparin at this time as she is currently pain-free and enzymes have been negative.  2. Acute CHF Exacerbation (Echo pending to determine HFpEF or HFrEF) - She reports worsening lower extremity edema but denies any orthopnea or PND.  BNP was mildly elevated at 172.  An echocardiogram has been performed with the results pending.  She did receive IV Lasix 20 mg this morning but will hold additional dosing given her upcoming catheterization later today.  She may require further diuresis following the procedure.  3. SVT - She has been in NSR this admission. Continue Cardizem CD '300mg'$  daily.   4. Aortic Stenosis -She did have mild to moderate aortic stenosis by echocardiogram in 2020. A repeat echocardiogram has been performed with results pending.  5. HLD - FLP in 11/2020 showed total cholesterol of 226, triglycerides 196, HDL 57 and LDL 134. Simvastatin was titrated at that time. Will plan to recheck FLP. If LDL above goal, would recommend switching to Crestor. She has experienced myalgias with some statins in the past.   6. Abnormal Chest CT - Chest CT this admission shows areas of groundglass opacity in the left lung with follow-up CT recommended in 3 to 6 months. Will need to be followed as  an outpatient.     Risk Assessment/Risk Scores:     HEAR Score (for undifferentiated chest pain):  HEAR Score: 5   For questions or updates, please contact [place] Please consult [email or web address] for contact info under    Signed, Erma Heritage, PA-C  03/21/2021 10:51 AM    Attending note:  Patient seen and examined.  I reviewed her records and discussed the case with Ms. Delano Metz, I agree with her above findings.  Ms. Shope has a history of CAD evident by prior coronary CTA with significant FFR involving distal LAD and managed medically to this point.  She has been followed by Dr. Percival Spanish who actually recommended a cardiac catheterization last year given worsening symptoms although she did not follow through with this.  She presents now reporting increasing shortness of breath with activity as well as intermittent chest pain and leg swelling.  On examination this morning she appears comfortable.  She is afebrile, systolic 0000000 with heart rate in the 80s in sinus rhythm by telemetry which I personally reviewed.  Lungs are clear.  Cardiac exam reveals RRR with 2/6 systolic murmur at the base.  Pertinent lab work includes potassium 4.2, BUN 14, creatinine 0.64, high-sensitivity troponin I of 6, BNP 172, hemoglobin 12.3, platelets 166.  I personally reviewed her ECG from August 17 showing sinus rhythm with decreased R wave progression.  Chest CTA shows no pulmonary embolus.  She does have groundglass opacities involving the left lung with no obvious infectious symptoms, COVID-19 negative.  Also small right pleural effusion and subpleural consolidation.  Echocardiogram from 2020 showed normal LVEF with moderate aortic regurgitation and mild to moderate aortic stenosis, follow-up study pending.  Patient presents with progressive shortness of breath and chest discomfort, also leg swelling, no clear evidence of ACS by cardiac enzymes, but known CAD based on previous coronary  CTA.  Dr. Percival Spanish planned on diagnostic cardiac catheterization last year although patient did not follow through at that time.  In light of progressive symptoms plan is transfer to [facility] for diagnostic cardiac catheterization.  Follow-up echocardiogram also pending for reassessment of LVEF and valvular status.  Risks and benefits discussed with the patient and her son, she is in agreement to proceed.  Satira Sark, M.D., F.A.C.C.

## 2021-03-21 NOTE — Interval H&P Note (Signed)
History and Physical Interval Note:  03/21/2021 2:55 PM  Krystal [name]  has presented today for surgery, with the diagnosis of unstable angina.  The various methods of treatment have been discussed with the patient and family. After consideration of risks, benefits and other options for treatment, the patient has consented to  Procedure(s): LEFT HEART CATH AND CORONARY ANGIOGRAPHY (N/A) as a surgical intervention.  The patient's history has been reviewed, patient examined, no change in status, stable for surgery.  I have reviewed the patient's chart and labs.  Questions were answered to the patient's satisfaction.    Cath Lab Visit (complete for each Cath Lab visit)  Clinical Evaluation Leading to the Procedure:   ACS: Yes.    Non-ACS:  N/A  [place]

## 2021-03-21 NOTE — Progress Notes (Signed)
*  PRELIMINARY RESULTS* Echocardiogram 2D Echocardiogram has been performed.  Krystal [name] 03/21/2021, 9:45 AM

## 2021-03-21 NOTE — H&P (View-Only) (Signed)
Cardiology Consultation:   Patient ID: Krystal [name] MRN: [id number]; DOB: 10-25-1940  Admit date: 03/20/2021 Date of Consult: 03/21/2021  PCP:  Dettinger, Fransisca Kaufmann, MD   [facility] Providers Cardiologist:  Minus Breeding, MD        Patient Profile:   Krystal [name] is a 80 y.o. female with a past medical history of CAD (s/p Coronary CT in 09/2019 with FFR significant along distal LAD and medically managed at that time), SVT, breast cancer (s/p mastectomy) and HLD who is being seen 03/21/2021 for the evaluation of chest pain at the request of Dr. Roderic Palau.  History of Present Illness:   Krystal [name] was last examined by Dr. Percival Spanish in 11/2019 and reported worsening chest pain and dyspnea on exertion at that time which would resolve with sublingual nitroglycerin. She did have a known hiatal hernia but given her progressive symptoms, cardiac catheterization was recommended for definitive evaluation. It appears this was never performed and she has not followed up with cardiology since.  She called the office yesterday reporting episodes of chest pain and was advised to go to the ED for further evaluation. In talking with the patient and her son today, she reports having intermittent episodes of chest tightness along with dyspnea on exertion for the past year. She reports it feels like her "bra is too tight" and this can occur with activity but has also awakened her from sleep at night. Uses NTG spray with improvement in her symptoms. She is more short of breath with activity and her son mentions she does not walk around the grocery store or [place] like she use to do due to her symptoms. He also thinks her leg weakness might be contributing as well. She denies any specific orthopnea or PND but has noticed worsening lower extremity edema over the past week. Denies any changes in fluid consumption. Reports following a low-sodium diet.  Initial labs show WBC 5.7, Hgb 12.3, platelets 166, Na+  137, K+ 4.1 and creatinine 0.75.  BNP 172. D-dimer 0.95. Initial and Hs Troponin negative at 6. Covid negative. CXR showing COPD and large hiatal hernia but no acute findings. CTA showed no evidence of a PE but she was noted to have areas of groundglass opacity in the left lung with follow-up CT recommended in 3 to 6 months. Was also noted to have a known large hiatal hernia with the majority of the stomach being intrathoracic. EKG shows NSR, HR 87 with no acute ST changes when compared to prior tracings.     Past Medical History:  Diagnosis Date   Anemia    Asthma    Brain tumor ([place])    1986   Breast cancer ([place])    Right breast   Cataract    Colon polyps    COPD (chronic obstructive pulmonary disease) (HCC)    Hiatal hernia    Hyperlipidemia    Leg edema    Macular degeneration    Osteoporosis    Pneumonia    PVD (posterior vitreous detachment)    Right knee pain    Shingles rash     Past Surgical History:  Procedure Laterality Date   CATARACT EXTRACTION, BILATERAL     COLONOSCOPY     CRANIECTOMY / CRANIOTOMY FOR EXCISION OF BRAIN TUMOR     Left side brain   MASTECTOMY Right    PARTIAL HYSTERECTOMY     POLYPECTOMY     SKIN CANCER EXCISION Left    Under left eye  Home Medications:  Prior to Admission medications   Medication Sig Start Date End Date Taking? Authorizing Provider  albuterol (VENTOLIN HFA) 108 (90 Base) MCG/ACT inhaler INHALE TWO PUFFS BY MOUTH 4 TIMES DAILY AS NEEDED Patient taking differently: Inhale 2 puffs into the lungs every 4 (four) hours as needed for wheezing or shortness of breath. 03/06/21  Yes Dettinger, Fransisca Kaufmann, MD  aspirin EC 81 MG tablet Take 81 mg by mouth at bedtime.   Yes [provider]  Calcium Citrate-Vitamin D (CALCIUM CITRATE + D) 315-250 MG-UNIT TABS Take 2 tablets by mouth daily. 12/10/15  Yes Cherre Robins, PharmD  cetirizine (ZYRTEC) 10 MG tablet Take 5-10 mg by mouth at bedtime.    Yes [provider]   Cholecalciferol (VITAMIN D) 2000 UNITS CAPS Take 1 capsule by mouth at bedtime.   Yes [provider]  denosumab (PROLIA) 60 MG/ML SOSY injection TO BE ADMINISTERED IN PHYSICIAN'S OFFICE. INJECT ONE SYRINGE SUBCOUSLY ONCE EVERY 6 MONTHS. REFRIGERATE. USE WITHIN 14 DAYS ONCE AT ROOM TEMPERATURE. 09/05/20  Yes Dettinger, Fransisca Kaufmann, MD  diazepam (VALIUM) 5 MG tablet Take 1 tablet (5 mg total) by mouth at bedtime as needed. 11/05/20  Yes Dettinger, Fransisca Kaufmann, MD  diltiazem (CARDIZEM CD) 300 MG 24 hr capsule Take 1 capsule (300 mg total) by mouth daily. 11/05/20  Yes Dettinger, Fransisca Kaufmann, MD  esomeprazole (NEXIUM) 40 MG capsule TAKE 1 CAPSULE BY MOUTH EVERY DAY 11/05/20  Yes Dettinger, Fransisca Kaufmann, MD  ferrous sulfate 325 (65 FE) MG tablet Take 1 tablet (325 mg total) by mouth at bedtime. 11/05/20  Yes Dettinger, Fransisca Kaufmann, MD  ipratropium-albuterol (DUONEB) 0.5-2.5 (3) MG/3ML SOLN Take 3 mLs by nebulization every 6 (six) hours as needed. Dx:  Asthma 493.90 Patient taking differently: Take 3 mLs by nebulization every 6 (six) hours as needed. Dx:  Asthma 493.90 04/03/17  Yes Chipper Herb, MD  montelukast (SINGULAIR) 10 MG tablet One tablet by mouth daily at bedtime 11/05/20  Yes Dettinger, Fransisca Kaufmann, MD  Multiple Vitamins-Minerals (PRESERVISION/LUTEIN PO) Take 1 capsule by mouth at bedtime.    Yes [provider]  nitroGLYCERIN (NITROLINGUAL) 0.4 MG/SPRAY spray ONE SPRAY UNDER TONGUE AS NEED FOR CHESTPAIN. MAY REPEAT IN 5 MINUTES IF PAIN NOT RELIEVED CALL 911. 11/17/18  Yes Hochrein, Jeneen Rinks, MD  Polyethyl Glycol-Propyl Glycol (SYSTANE) 0.4-0.3 % SOLN Place 1 drop into both eyes 2 (two) times daily.   Yes [provider]  simvastatin (ZOCOR) 20 MG tablet TAKE 1 TABLET BY MOUTH EVERYDAY AT BEDTIME Patient taking differently: Take 20 mg by mouth daily at 6 PM. 03/06/21  Yes Dettinger, Fransisca Kaufmann, MD  diclofenac Sodium (VOLTAREN) 1 % GEL Apply 2 g topically 4 (four) times daily. Patient not taking: No sig  reported 11/05/20   Dettinger, Fransisca Kaufmann, MD  metoprolol tartrate (LOPRESSOR) 25 MG tablet TAKE 1 TABLET (25 MG TOTAL) BY MOUTH DAILY AS NEEDED. 11/05/20   Dettinger, Fransisca Kaufmann, MD  RESTASIS 0.05 % ophthalmic emulsion Place 1 drop into both eyes 2 (two) times daily. Patient not taking: Reported on 03/20/2021 03/05/18   [provider]    Inpatient Medications: Scheduled Meds:  aspirin EC  81 mg Oral QHS   cycloSPORINE  1 drop Both Eyes BID   diclofenac Sodium  2 g Topical QID   diltiazem  300 mg Oral Daily   enoxaparin (LOVENOX) injection  40 mg Subcutaneous Q24H   ipratropium-albuterol  3 mL Nebulization TID   montelukast  10  mg Oral QHS   pantoprazole  40 mg Oral Daily   simvastatin  20 mg Oral q1800   sodium chloride flush  3 mL Intravenous Q12H   sodium chloride flush  3 mL Intravenous Q12H   Continuous Infusions:  sodium chloride     sodium chloride     PRN Meds: sodium chloride, acetaminophen, acetaminophen, diazepam, ondansetron (ZOFRAN) IV, ondansetron (ZOFRAN) IV, sodium chloride flush  Allergies:    Allergies  Allergen Reactions   Actonel [Risedronate] Nausea And Vomiting and Other (See Comments)    Throat tightness   Fosamax [Alendronate Sodium] Other (See Comments)    esophagitis   Mevacor [Lovastatin] Other (See Comments)    myalgias   Miacalcin [Calcitonin ([place])] Nausea And Vomiting   Mobic [Meloxicam] Hives and Itching    rash    Social History:   Social History   Socioeconomic History   Marital status: Widowed    Spouse name: Not on file   Number of children: 1   Years of education: 7th   Highest education level: 7th grade  Occupational History   Occupation: retired  Tobacco Use   Smoking status: Former    Types: Cigarettes    Quit date: 02/09/1961    Years since quitting: 60.1   Smokeless tobacco: Never  Vaping Use   Vaping Use: Never used  Substance and Sexual Activity   Alcohol use: No    Alcohol/week: 0.0 standard drinks   Drug use:  No   Sexual activity: Not Currently  Other Topics Concern   Not on file  Social History Narrative   Son lives with patient.   Retired from farming.   Highest level of education:  7th   Social Determinants of Health   Financial Resource Strain: Low Risk    Difficulty of Paying Living Expenses: Not hard at all  Food Insecurity: No Food Insecurity   Worried About Charity fundraiser in the Last Year: Never true   Arboriculturist in the Last Year: Never true  Transportation Needs: No Transportation Needs   Lack of Transportation (Medical): No   Lack of Transportation (Non-Medical): No  Physical Activity: Inactive   Days of Exercise per Week: 0 days   Minutes of Exercise per Session: 0 min  Stress: No Stress Concern Present   Feeling of Stress : Not at all  Social Connections: Moderately Integrated   Frequency of Communication with Friends and Family: More than three times a week   Frequency of Social Gatherings with Friends and Family: More than three times a week   Attends Religious Services: More than 4 times per year   Active Member of [employer] or Organizations: Yes   Attends Archivist Meetings: More than 4 times per year   Marital Status: Widowed  Human resources officer Violence: Not At Risk   Fear of Current or Ex-Partner: No   Emotionally Abused: No   Physically Abused: No   Sexually Abused: No    Family History:    Family History  Problem Relation Age of Onset   Heart disease Mother 3       Stroke    Dementia Mother    Congestive Heart Failure Mother    Osteoporosis Mother    Stroke Mother    Heart disease Sister    Hypertension Sister    Diabetes Sister    Osteoporosis Sister    Heart disease Brother    Hyperlipidemia Brother    Diabetes Brother  Heart disease Sister    Hypertension Sister    Diabetes Sister    Diabetes Sister    Hypertension Sister    Diabetes Sister    Hypertension Sister    Diabetes Brother    Hyperlipidemia Brother    Cancer  Father        lung   Heart attack Father    Diabetes Brother    Heart disease Brother    Hyperlipidemia Brother    Diabetes Brother    Diabetes Son    Colon cancer Neg Hx    Rectal cancer Neg Hx    Stomach cancer Neg Hx    Esophageal cancer Neg Hx      ROS:  Please see the history of present illness.   All other ROS reviewed and negative.     Physical Exam/Data:   Vitals:   03/20/21 2024 03/21/21 0403 03/21/21 0500 03/21/21 0830  BP:  117/66  (!) 148/69  Pulse:  88    Resp:  19    Temp:  98 F (36.7 C)    TempSrc:      SpO2: 95% 96%    Weight:   82.1 kg   Height:       No intake or output data in the 24 hours ending 03/21/21 1051 Last 3 Weights 03/21/2021 03/20/2021 03/20/2021  Weight (lbs) 180 lb 14.2 oz 180 lb 14.4 oz 206 lb 11.2 oz  Weight (kg) 82.05 kg 82.056 kg 93.759 kg     Body mass index is 32.04 kg/m.  General:  Well nourished, well developed female appearing in no acute distress. HEENT: normal Lymph: no adenopathy Neck: no JVD Endocrine:  No thryomegaly Vascular: No carotid bruits; FA pulses 2+ bilaterally without bruits  Cardiac:  normal S1, S2; RRR; 2/6 SEM along RUSB. Lungs:  Scattered rhonchi. No wheezing or rales Abd: soft, nontender, no hepatomegaly  Ext: 1+ pitting edema bilaterally. Musculoskeletal:  No deformities, BUE and BLE strength normal and equal Skin: warm and dry  Neuro:  CNs 2-12 intact, no focal abnormalities noted Psych:  Normal affect   EKG:  The EKG was personally reviewed and demonstrates: NSR, HR 87 with no acute ST changes when compared to prior tracings.   Telemetry:  Telemetry was personally reviewed and demonstrates: NSR, HR in 70's to 80's with occasional PVC's.   Relevant CV Studies:  Echocardiogram: 05/2019 IMPRESSIONS     1. Left ventricular ejection fraction, by visual estimation, is 60 to  65%. The left ventricle has normal function. Normal left ventricular size.  There is no left ventricular hypertrophy.    2. Elevated left ventricular end-diastolic pressure.   3. Left ventricular diastolic Doppler parameters are consistent with  pseudonormalization pattern of LV diastolic filling.   4. Global right ventricle has low normal systolic function.The right  ventricular size is normal. No increase in right ventricular wall  thickness.   5. Left atrial size was normal.   6. Right atrial size was normal.   7. Mild mitral annular calcification.   8. Moderate aortic valve annular calcification.   9. The mitral valve is degenerative. Mild mitral valve regurgitation.  10. The tricuspid valve is grossly normal. Tricuspid valve regurgitation  moderate.  11. The aortic valve is tricuspid Aortic valve regurgitation is moderate  by color flow Doppler. Mild to moderate aortic valve stenosis.  12. There is Mild calcification of the aortic valve.  13. There is Mild thickening of the aortic valve.  14. The pulmonic valve  was not well visualized. Pulmonic valve  regurgitation is not visualized by color flow Doppler.  15. Normal pulmonary artery systolic pressure.  16. The inferior vena cava is normal in size with greater than 50%  respiratory variability, suggesting right atrial pressure of 3 mmHg.   Coronary CT: 09/2019 Coronary Arteries:  Normal coronary origin.  Right dominance.   RCA is a large dominant artery that gives rise to PDA and two PLV branches. There is diffuse calcification with minimal (<25%) obstruction proximally and moderate (50-69%) mixed plaque in the mid RCA. There is no plaque in the distal RCA or PDA.   Left main is a large artery that gives rise to LAD, RI, and LCX arteries. Minimal (<25%) calcified plaque at the ostium.   LAD is a large vessel that has minimal (<25%) eccentric, calcified plaque at the ostium.   LCX is a non-dominant artery that gives rise to one large OM1 branch. There is no plaque. The LCX is very small distal to OM1.   RI is a small vessel without plaque.    Other findings:   Normal pulmonary vein drainage into the left atrium.   Normal let atrial appendage without a thrombus.   Normal size of the pulmonary artery.   Mild mitral annular calcification.   Small PFO with right-to-left shunting.   IMPRESSION: IMPRESSION 1. Coronary calcium score of 536. This was 81st percentile for age and sex matched control.   2. Normal coronary origin with right dominance.   3.  Moderate (CAD-RADS 3) mixed plaque in the mid RCA.   4.  Non-obstructing plaque in the LM and LAD.   5.  Will send study for FFRct.  IMPRESSION: 1.  FFRct demonstrates possible ischemia in the distal LAD.   2.  No hemodynamically significant stenosis in the LM, LCX or RCA.   3. Recommend medical management and aggressive risk factor modification.   Note: These examples are not recommendations of [employer] and only provided as examples of what other customers are doing.     Laboratory Data:  High Sensitivity Troponin:   Recent Labs  Lab 03/20/21 1226 03/20/21 1351  TROPONINIHS 6 6     Chemistry Recent Labs  Lab 03/20/21 1226 03/21/21 0614  NA 137 139  K 4.1 4.2  CL 107 103  CO2 26 29  GLUCOSE 104* 92  BUN 22 14  CREATININE 0.75 0.64  CALCIUM 8.7* 8.7*  GFRNONAA >60 >60  ANIONGAP 4* 7    No results for input(s): PROT, ALBUMIN, AST, ALT, ALKPHOS, BILITOT in the last 168 hours. Hematology Recent Labs  Lab 03/20/21 1226  WBC 5.7  RBC 4.15  HGB 12.3  HCT 39.3  MCV 94.7  MCH 29.6  MCHC 31.3  RDW 13.5  PLT 166   BNP Recent Labs  Lab 03/20/21 1226  BNP 172.0*    DDimer  Recent Labs  Lab 03/20/21 1226  DDIMER 0.95*     Radiology/Studies:  DG Chest 2 View  Result Date: 03/20/2021 CLINICAL DATA:  Chest pain, tightness, shortness of breath EXAM: CHEST - 2 VIEW COMPARISON:  None. FINDINGS: The cardiomediastinal silhouette is similar to the prior study. There is calcified atherosclerotic plaque of the aortic arch. The lungs remain  hyperinflated consistent with COPD. There is no focal consolidation or pulmonary edema. There is no significant effusion. There is no pneumothorax. Wedge-shaped opacity in the right apex is unchanged going back multiple prior studies and likely reflects scarring. Retrocardiac opacity is consistent with a  large hiatal hernia as seen on prior CT. There is no acute osseous abnormality. IMPRESSION: 1. No radiographic evidence of acute cardiopulmonary process. 2. COPD. 3. Large hiatal hernia. Electronically Signed   By: Valetta Mole M.D.   On: 03/20/2021 13:12   CT Angio Chest Pulmonary Embolism (PE) W or WO Contrast  Result Date: 03/20/2021 CLINICAL DATA:  PE suspected, low/intermediate prob, positive D-dimer Chest pain and shortness of breath EXAM: CT ANGIOGRAPHY CHEST WITH CONTRAST TECHNIQUE: Multidetector CT imaging of the chest was performed using the standard protocol during bolus administration of intravenous contrast. Multiplanar CT image reconstructions and MIPs were obtained to evaluate the vascular anatomy. CONTRAST:  55m OMNIPAQUE IOHEXOL 350 MG/ML SOLN COMPARISON:  Radiograph earlier today.  Cardiac CT 09/23/2019 FINDINGS: Cardiovascular: There are no filling defects within the pulmonary arteries to suggest pulmonary embolus. Next which shows atherosclerosis of the thoracic aorta with tortuosity. No dissection or acute findings. Heart is normal in size. Trace pericardial fluid. Mediastinum/Nodes: Large hiatal hernia with the majority of the stomach being intrathoracic. Esophagus mildly patulous. No enlarged mediastinal or hilar lymph nodes. No thyroid nodule. Lungs/Pleura: Subpleural consolidation in the periphery of the right upper lobe has some punctate internal calcifications, and appears slightly linear and bandlike on coronal reformats. This is partially included in previous coronary CT and appears grossly stable. There are faint areas of ground-glass opacity in the left lung, including series 6,  image 14 at the lung apex, periphery of the upper lobe series 6, image 34 and 51. There is compressive atelectasis in the lower lobes related to hiatal hernia. No pulmonary edema. Small right pleural effusion. Upper Abdomen: Large hiatal hernia. No acute upper abdominal findings. Musculoskeletal: There are no acute or suspicious osseous abnormalities. Mild thoracic spondylosis. Right mastectomy. Review of the MIP images confirms the above findings. IMPRESSION: 1. No pulmonary embolus. 2. Faint areas of ground-glass opacity in the left lung are nonspecific, may be infectious or inflammatory, including COVID-19. Would recommend follow-up as a ground-glass nodules as follows: Non-contrast chest CT at 3-6 months is recommended. If nodules persist, subsequent management will be based upon the most suspicious nodule(s). This recommendation follows the consensus statement: Guidelines for Management of Incidental Pulmonary Nodules Detected on CT Images: From the Fleischner Society 2017; Radiology 2017; 284:228-243. 3. Small right pleural effusion. 4. Subpleural consolidation in the periphery of the right upper lobe has a slightly linear and bandlike appearance and may be scarring. Given right mastectomy findings may be related to post radiation change. Given there are no prior exams to establish stability, this can be assessed on follow-up as recommended above. 5. Large hiatal hernia with the majority of the stomach being intrathoracic. Aortic Atherosclerosis (ICD10-I70.0). Electronically Signed   By: MKeith RakeM.D.   On: 03/20/2021 19:31     Assessment and Plan:   1. Chest Pain/CAD - Reports initially presenting with lower extremity edema but also mentions episodes of chest tightness and worsening dyspnea on exertion occurring for the past year which have been responsive to NTG. - Hs Troponin values have been negative and her EKG is without acute ST changes. Echo pending. She does have known CAD with prior  Coronary CT in 09/2019 showing moderate plaque along the RCA and FFR was significant along distal LAD and medically managed at that time. Eventually a cath was recommended due to progressive symptoms but she never scheduled the procedure due to COVID-19 and transportation issues.  She is now willing to proceed with a cardiac catheterization  for definitive evaluation. The patient understands that risks include but are not limited to stroke (1 in 1000), death (1 in 36), kidney failure [usually temporary] (1 in 500), bleeding (1 in 200), allergic reaction [possibly serious] (1 in 200).   - Continue ASA '81mg'$  daily and Simvastatin '20mg'$  daily (may need to switch to a different statin as outlined below). Would not start Heparin at this time as she is currently pain-free and enzymes have been negative.  2. Acute CHF Exacerbation (Echo pending to determine HFpEF or HFrEF) - She reports worsening lower extremity edema but denies any orthopnea or PND.  BNP was mildly elevated at 172.  An echocardiogram has been performed with the results pending.  She did receive IV Lasix 20 mg this morning but will hold additional dosing given her upcoming catheterization later today.  She may require further diuresis following the procedure.  3. SVT - She has been in NSR this admission. Continue Cardizem CD '300mg'$  daily.   4. Aortic Stenosis -She did have mild to moderate aortic stenosis by echocardiogram in 2020. A repeat echocardiogram has been performed with results pending.  5. HLD - FLP in 11/2020 showed total cholesterol of 226, triglycerides 196, HDL 57 and LDL 134. Simvastatin was titrated at that time. Will plan to recheck FLP. If LDL above goal, would recommend switching to Crestor. She has experienced myalgias with some statins in the past.   6. Abnormal Chest CT - Chest CT this admission shows areas of groundglass opacity in the left lung with follow-up CT recommended in 3 to 6 months. Will need to be followed as  an outpatient.     Risk Assessment/Risk Scores:     HEAR Score (for undifferentiated chest pain):  HEAR Score: 5   For questions or updates, please contact [facility] Please consult [email or web address] for contact info under    Signed, Erma Heritage, PA-C  03/21/2021 10:51 AM    Attending note:  Patient seen and examined.  I reviewed her records and discussed the case with Krystal [name], I agree with her above findings.  Krystal [name] has a history of CAD evident by prior coronary CTA with significant FFR involving distal LAD and managed medically to this point.  She has been followed by Dr. Percival Spanish who actually recommended a cardiac catheterization last year given worsening symptoms although she did not follow through with this.  She presents now reporting increasing shortness of breath with activity as well as intermittent chest pain and leg swelling.  On examination this morning she appears comfortable.  She is afebrile, systolic 0000000 with heart rate in the 80s in sinus rhythm by telemetry which I personally reviewed.  Lungs are clear.  Cardiac exam reveals RRR with 2/6 systolic murmur at the base.  Pertinent lab work includes potassium 4.2, BUN 14, creatinine 0.64, high-sensitivity troponin I of 6, BNP 172, hemoglobin 12.3, platelets 166.  I personally reviewed her ECG from August 17 showing sinus rhythm with decreased R wave progression.  Chest CTA shows no pulmonary embolus.  She does have groundglass opacities involving the left lung with no obvious infectious symptoms, COVID-19 negative.  Also small right pleural effusion and subpleural consolidation.  Echocardiogram from 2020 showed normal LVEF with moderate aortic regurgitation and mild to moderate aortic stenosis, follow-up study pending.  Patient presents with progressive shortness of breath and chest discomfort, also leg swelling, no clear evidence of ACS by cardiac enzymes, but known CAD based on previous coronary  CTA.  Dr. Percival Spanish planned on diagnostic cardiac catheterization last year although patient did not follow through at that time.  In light of progressive symptoms plan is transfer to [facility] for diagnostic cardiac catheterization.  Follow-up echocardiogram also pending for reassessment of LVEF and valvular status.  Risks and benefits discussed with the patient and her son, she is in agreement to proceed.  Satira Sark, M.D., F.A.C.C.

## 2021-03-21 NOTE — Progress Notes (Signed)
Received patient from [facility] alert and oirented X4, skin warm and dry, resp even and unlabored at this time.  Patient denies any CP or discomfort.  IVF started in left FA , consent signed and patient placed on bedside monitor.  Call bell in reach.  Pt waiting for cath procedure.

## 2021-03-21 NOTE — Progress Notes (Signed)
Patient seen and examined.  Reports that shortness of breath is better.  She is not having any chest pain.  Lungs are clear to auscultation bilaterally.  Overall vitals were stable overnight.  She was seen by cardiology today with plans to transfer to [facility] for further evaluation cardiac cath.  She will be transferred to cardiology service at [facility].  Hospitalist service will sign off at this point, but please feel free to recall [place] if we can be of any assistance.  [employer]

## 2021-03-21 NOTE — Progress Notes (Signed)
Patient transported to [facility] today for procedure, transported by [place]. Belongings sent with patient. Patient received Living with Heart Failure book. Patient stable upon transfer.

## 2021-03-21 NOTE — TOC Initial Note (Signed)
Transition of Care [facility]) - Initial/Assessment Note    Patient Details  Name: Krystal [name] MRN: [id number] Date of Birth: 10-02-1940  Transition of [facility]) CM/SW Contact:    Boneta Lucks, RN Phone Number: 03/21/2021, 12:24 PM  Clinical Narrative:    Patient admitted with chest pain. TOC consulted for CHF. Living better book order and reviewed with daughter in [place], RN to give to her son.  Patient lives at home with her son and daughter in law. She walks with a walker. They do not add salt to her foods. They do not do daily weights. Patient is being transferred to [facility] for Cath.                Expected Discharge Plan: [place] Barriers to Discharge: Continued Medical Work up   Patient Goals and CMS Choice Patient states their goals for this hospitalization and ongoing recovery are:: to go home. CMS Medicare.gov Compare Post Acute Care list provided to:: Patient Represenative (must comment) Choice offered to / list presented to : Adult Children  Expected Discharge Plan and Services Expected Discharge Plan: [place]  Living arrangements for the past 2 months: Single Family Home          Prior Living Arrangements/Services Living arrangements for the past 2 months: Single Family Home Lives with:: Adult Children Patient language and need for interpreter reviewed:: Yes        Need for Family Participation in Patient Care: Yes (Comment) Care giver support system in place?: Yes (comment) Current home services: DME Criminal Activity/Legal Involvement Pertinent to Current Situation/Hospitalization: No - Comment as needed  Activities of Daily Living Home Assistive Devices/Equipment: Walker (specify type) ADL Screening (condition at time of admission) Patient's cognitive ability adequate to safely complete daily activities?: Yes Is the patient deaf or have difficulty hearing?: No Does the patient have difficulty seeing, even when wearing  glasses/contacts?: No Does the patient have difficulty concentrating, remembering, or making decisions?: No Patient able to express need for assistance with ADLs?: Yes Does the patient have difficulty dressing or bathing?: No Independently performs ADLs?: Yes (appropriate for developmental age) Does the patient have difficulty walking or climbing stairs?: No Weakness of Legs: None Weakness of Arms/Hands: None  Permission Sought/Granted    Emotional Assessment      Orientation: : Oriented to Self, Oriented to Place, Oriented to Situation Alcohol / Substance Use: Not Applicable Psych Involvement: No (comment)  Admission diagnosis:  SOB (shortness of breath) [R06.02] Chest pain [R07.9] Chest pain, unspecified type [R07.9] Patient Active Problem List   Diagnosis Date Noted   Chest pain 03/20/2021   CAD (coronary artery disease) 03/20/2021   SOB (shortness of breath) 05/17/2019   Osteoporosis 03/24/2019   Chest pain of uncertain etiology [id number]   Carpal tunnel syndrome of right wrist 03/25/2018   Influenza with pneumonia 10/30/2015   Abdominal aortic atherosclerosis ([place]) 04/10/2015   PSVT (paroxysmal supraventricular tachycardia) ([place]) 05/10/2014   Macular degeneration, age related, nonexudative 08/24/2013   History of right mastectomy 07/13/2013   Anemia, iron deficiency 06/21/2013   Allergic rhinitis 06/21/2013   Chronic low back pain 06/21/2013   Malignant neoplasm of female breast ([place]) 11/22/2007   Mixed hyperlipidemia 11/22/2007   Anxiety state 11/22/2007   Depression, recurrent ([place]) 11/22/2007   Asthma 11/22/2007   GERD 11/22/2007   DUODENITIS, WITHOUT HEMORRHAGE 08/14/2007   Diaphragmatic hernia 08/14/2007   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

## 2021-03-22 ENCOUNTER — Encounter (HOSPITAL_COMMUNITY): Payer: Self-pay | Admitting: Internal Medicine

## 2021-03-22 DIAGNOSIS — I480 Paroxysmal atrial fibrillation: Secondary | ICD-10-CM | POA: Diagnosis not present

## 2021-03-22 DIAGNOSIS — I2510 Atherosclerotic heart disease of native coronary artery without angina pectoris: Secondary | ICD-10-CM | POA: Diagnosis not present

## 2021-03-22 LAB — LIPID PANEL
Cholesterol: 134 mg/dL (ref 0–200)
HDL: 55 mg/dL (ref >40)
LDL Cholesterol: 62 mg/dL (ref 0–99)
Total CHOL/HDL Ratio: 2.4 RATIO
Triglycerides: 83 mg/dL (ref <150)
VLDL: 17 mg/dL (ref 0–40)

## 2021-03-22 LAB — BASIC METABOLIC PANEL
Anion gap: 11 (ref 5–15)
BUN: 11 mg/dL (ref 8–23)
CO2: 26 mmol/L (ref 22–32)
Calcium: 8.9 mg/dL (ref 8.9–10.3)
Chloride: 100 mmol/L (ref 98–111)
Creatinine, Ser: 0.82 mg/dL (ref 0.44–1.00)
GFR, Estimated: >60 mL/min (ref >60)
Glucose, Bld: 86 mg/dL (ref 70–99)
Potassium: 3.8 mmol/L (ref 3.5–5.1)
Sodium: 137 mmol/L (ref 135–145)

## 2021-03-22 LAB — CBC
HCT: 38.6 % (ref 36.0–46.0)
Hemoglobin: 12.4 g/dL (ref 12.0–15.0)
MCH: 29.2 pg (ref 26.0–34.0)
MCHC: 32.1 g/dL (ref 30.0–36.0)
MCV: 91 fL (ref 80.0–100.0)
Platelets: 159 10*3/uL (ref 150–400)
RBC: 4.24 MIL/uL (ref 3.87–5.11)
RDW: 13.4 % (ref 11.5–15.5)
WBC: 5.4 10*3/uL (ref 4.0–10.5)
nRBC: 0 % (ref 0.0–0.2)

## 2021-03-22 LAB — TSH: TSH: 3.542 u[IU]/mL (ref 0.350–4.500)

## 2021-03-22 LAB — HEPARIN LEVEL (UNFRACTIONATED)
Heparin Unfractionated: 0.17 IU/mL — ABNORMAL LOW (ref 0.30–0.70)
Heparin Unfractionated: 0.27 IU/mL — ABNORMAL LOW (ref 0.30–0.70)

## 2021-03-22 MED ORDER — APIXABAN 5 MG PO TABS
5.0000 mg | ORAL_TABLET | Freq: Two times a day (BID) | ORAL | Status: DC
  Administered 2021-03-22: 5 mg via ORAL
  Filled 2021-03-22: qty 1

## 2021-03-22 MED ORDER — APIXABAN 5 MG PO TABS: 5.0000 mg | ORAL_TABLET | Freq: Two times a day (BID) | ORAL | 3 refills | Status: DC

## 2021-03-22 MED ORDER — IPRATROPIUM-ALBUTEROL 0.5-2.5 (3) MG/3ML IN SOLN: 3.0000 mL | RESPIRATORY_TRACT | Status: DC | PRN

## 2021-03-22 MED ORDER — ROSUVASTATIN CALCIUM 20 MG PO TABS: 20.0000 mg | ORAL_TABLET | Freq: Every day | ORAL | 1 refills | Status: DC

## 2021-03-22 MED ORDER — ROSUVASTATIN CALCIUM 20 MG PO TABS
20.0000 mg | ORAL_TABLET | Freq: Every day | ORAL | Status: DC
  Administered 2021-03-22: 20 mg via ORAL
  Filled 2021-03-22: qty 1

## 2021-03-22 MED FILL — Heparin Sod (Porcine)-NaCl IV Soln 1000 Unit/500ML-0.9%: INTRAVENOUS | Qty: 500 | Status: AC

## 2021-03-22 MED FILL — Nitroglycerin IV Soln 100 MCG/ML in D5W: INTRA_ARTERIAL | Qty: 10 | Status: AC

## 2021-03-22 NOTE — Progress Notes (Signed)
Progress Note  Patient Name: Krystal [name] Date of Encounter: 03/22/2021  [facility] Cardiologist: Minus Breeding, MD   Subjective   No CP or dyspnea  Inpatient Medications    Scheduled Meds:  cycloSPORINE  1 drop Both Eyes BID   diclofenac Sodium  2 g Topical QID   diltiazem  300 mg Oral Daily   montelukast  10 mg Oral QHS   pantoprazole  40 mg Oral Daily   simvastatin  20 mg Oral q1800   sodium chloride flush  3 mL Intravenous Q12H   sodium chloride flush  3 mL Intravenous Q12H   sodium chloride flush  3 mL Intravenous Q12H   Continuous Infusions:  sodium chloride     sodium chloride     heparin 1,000 Units/hr (03/21/21 2353)   PRN Meds: sodium chloride, sodium chloride, acetaminophen, acetaminophen, diazepam, ipratropium-albuterol, ondansetron (ZOFRAN) IV, ondansetron (ZOFRAN) IV, sodium chloride flush, sodium chloride flush   Vital Signs    Vitals:   03/21/21 1645 03/22/21 0006 03/22/21 0539 03/22/21 0751  BP: 133/72 124/73 105/64   Pulse: 86 96 89   Resp: '16 15 19   '$ Temp: 97.8 F (36.6 C) 98 F (36.7 C) 98 F (36.7 C)   TempSrc: Oral Oral Oral   SpO2: 95% 95% 98% 96%  Weight:   83.9 kg   Height:        Intake/Output Summary (Last 24 hours) at 03/22/2021 1028 Last data filed at 03/21/2021 2348 Gross per 24 hour  Intake 13.68 ml  Output 500 ml  Net -486.32 ml   Last 3 Weights 03/22/2021 03/21/2021 03/20/2021  Weight (lbs) 184 lb 15.5 oz 180 lb 14.2 oz 180 lb 14.4 oz  Weight (kg) 83.9 kg 82.05 kg 82.056 kg      Telemetry    Sinus with PAF- Personally Reviewed  Physical Exam   GEN: No acute distress.   Neck: No JVD Cardiac: RRR, 2/6 systolic murmur, S2 is not diminished. Respiratory: Clear to auscultation bilaterally. GI: Soft, nontender, non-distended  MS: No edema; radial cath site with no hematoma Neuro:  Nonfocal  Psych: Normal affect   Labs    High Sensitivity Troponin:   Recent Labs  Lab 03/20/21 1226 03/20/21 1351   TROPONINIHS 6 6      Chemistry Recent Labs  Lab 03/20/21 1226 03/21/21 0614 03/22/21 0148  NA 137 139 137  K 4.1 4.2 3.8  CL 107 103 100  CO2 '26 29 26  '$ GLUCOSE 104* 92 86  BUN '22 14 11  '$ CREATININE 0.75 0.64 0.82  CALCIUM 8.7* 8.7* 8.9  GFRNONAA >60 >60 >60  ANIONGAP 4* 7 11     Hematology Recent Labs  Lab 03/20/21 1226 03/22/21 0148  WBC 5.7 5.4  RBC 4.15 4.24  HGB 12.3 12.4  HCT 39.3 38.6  MCV 94.7 91.0  MCH 29.6 29.2  MCHC 31.3 32.1  RDW 13.5 13.4  PLT 166 159    BNP Recent Labs  Lab 03/20/21 1226  BNP 172.0*     DDimer  Recent Labs  Lab 03/20/21 1226  DDIMER 0.95*     Radiology    DG Chest 2 View  Result Date: 03/20/2021 CLINICAL DATA:  Chest pain, tightness, shortness of breath EXAM: CHEST - 2 VIEW COMPARISON:  None. FINDINGS: The cardiomediastinal silhouette is similar to the prior study. There is calcified atherosclerotic plaque of the aortic arch. The lungs remain hyperinflated consistent with COPD. There is no focal consolidation or pulmonary edema.  There is no significant effusion. There is no pneumothorax. Wedge-shaped opacity in the right apex is unchanged going back multiple prior studies and likely reflects scarring. Retrocardiac opacity is consistent with a large hiatal hernia as seen on prior CT. There is no acute osseous abnormality. IMPRESSION: 1. No radiographic evidence of acute cardiopulmonary process. 2. COPD. 3. Large hiatal hernia. Electronically Signed   By: Valetta Mole M.D.   On: 03/20/2021 13:12   CT Angio Chest Pulmonary Embolism (PE) W or WO Contrast  Result Date: 03/20/2021 CLINICAL DATA:  PE suspected, low/intermediate prob, positive D-dimer Chest pain and shortness of breath EXAM: CT ANGIOGRAPHY CHEST WITH CONTRAST TECHNIQUE: Multidetector CT imaging of the chest was performed using the standard protocol during bolus administration of intravenous contrast. Multiplanar CT image reconstructions and MIPs were obtained to  evaluate the vascular anatomy. CONTRAST:  53m OMNIPAQUE IOHEXOL 350 MG/ML SOLN COMPARISON:  Radiograph earlier today.  Cardiac CT 09/23/2019 FINDINGS: Cardiovascular: There are no filling defects within the pulmonary arteries to suggest pulmonary embolus. Next which shows atherosclerosis of the thoracic aorta with tortuosity. No dissection or acute findings. Heart is normal in size. Trace pericardial fluid. Mediastinum/Nodes: Large hiatal hernia with the majority of the stomach being intrathoracic. Esophagus mildly patulous. No enlarged mediastinal or hilar lymph nodes. No thyroid nodule. Lungs/Pleura: Subpleural consolidation in the periphery of the right upper lobe has some punctate internal calcifications, and appears slightly linear and bandlike on coronal reformats. This is partially included in previous coronary CT and appears grossly stable. There are faint areas of ground-glass opacity in the left lung, including series 6, image 14 at the lung apex, periphery of the upper lobe series 6, image 34 and 51. There is compressive atelectasis in the lower lobes related to hiatal hernia. No pulmonary edema. Small right pleural effusion. Upper Abdomen: Large hiatal hernia. No acute upper abdominal findings. Musculoskeletal: There are no acute or suspicious osseous abnormalities. Mild thoracic spondylosis. Right mastectomy. Review of the MIP images confirms the above findings. IMPRESSION: 1. No pulmonary embolus. 2. Faint areas of ground-glass opacity in the left lung are nonspecific, may be infectious or inflammatory, including COVID-19. Would recommend follow-up as a ground-glass nodules as follows: Non-contrast chest CT at 3-6 months is recommended. If nodules persist, subsequent management will be based upon the most suspicious nodule(s). This recommendation follows the consensus statement: Guidelines for Management of Incidental Pulmonary Nodules Detected on CT Images: From the Fleischner Society 2017; Radiology  2017; 284:228-243. 3. Small right pleural effusion. 4. Subpleural consolidation in the periphery of the right upper lobe has a slightly linear and bandlike appearance and may be scarring. Given right mastectomy findings may be related to post radiation change. Given there are no prior exams to establish stability, this can be assessed on follow-up as recommended above. 5. Large hiatal hernia with the majority of the stomach being intrathoracic. Aortic Atherosclerosis (ICD10-I70.0). Electronically Signed   By: MKeith RakeM.D.   On: 03/20/2021 19:31   CARDIAC CATHETERIZATION  Result Date: 03/21/2021 Conclusions: Mild to moderate, non-obstructive coronary artery disease with 40-50% ostial LMCA stenosis that is not hemodynamically significant (RFR = 0.98) and 20% mid RCA disease. Mildly elevated left ventricular filling pressure. Mild aortic valve stenosis. Paroxysmal atrial fibrillation/flutter (appears to be a new diagnosis). Recommendations: Medical therapy and risk factor modification to prevent progression of coronary artery disease. Initiate heparin infusion 2 hours after TR band removal.  Anticipate transition to DOAC tomorrow morning if no bleeding/vascular injury post-catheterization. Monitor overnight,  given that progressive dyspnea and chest tightness may be due to atrial fibrillation observed during catheterization and may require further evaluation/management. Nelva Bush, MD [facility]  ECHOCARDIOGRAM COMPLETE  Result Date: 03/21/2021    ECHOCARDIOGRAM REPORT   Patient Name:   Krystal [name] Date of Exam: 03/21/2021 Medical Rec #:  [id number]          Height:       63.0 in Accession #:    [id number]         Weight:       180.9 lb Date of Birth:  February 19, 1941          BSA:          1.853 m Patient Age:    55 years           BP:           117/66 mmHg Patient Gender: F                  HR:           88 bpm. Exam Location:  [facility] Procedure: 2D Echo, Cardiac Doppler and Color Doppler  Indications:    Dyspnea  History:        Patient has prior history of Echocardiogram examinations, most                 recent 05/30/2019. CAD, Abnormal ECG, Arrythmias:LBBB,                 Signs/Symptoms:Chest Pain and Shortness of Breath; Risk                 Factors:Dyslipidemia.  Sonographer:    Wenda Low Referring Phys: [phone] [facility]  Sonographer Comments: Breast CA-Rt breast mastectomy IMPRESSIONS  1. Left ventricular ejection fraction, by estimation, is 65 to 70%. The left ventricle has normal function. The left ventricle has no regional wall motion abnormalities. Left ventricular diastolic parameters are indeterminate.  2. Right ventricular systolic function is normal. The right ventricular size is normal. There is normal pulmonary artery systolic pressure. The estimated right ventricular systolic pressure is [id number] mmHg.  3. A small pericardial effusion is present. The pericardial effusion is posterior to the left ventricle.  4. The mitral valve is grossly normal. Mild mitral valve regurgitation. Moderate mitral annular calcification.  5. Tricuspid valve regurgitation is mild to moderate.  6. The aortic valve is tricuspid. There is moderate calcification of the aortic valve. Aortic valve regurgitation is moderate. Mild aortic valve stenosis. Aortic regurgitation PHT measures 330 msec. Aortic valve mean gradient measures 9.5 mmHg. Aortic valve Vmax measures 2.14 m/s. Dimentionless index 0.50.  7. The inferior vena cava is normal in size with greater than 50% respiratory variability, suggesting right atrial pressure of 3 mmHg. Comparison(s): No significant change from prior study. FINDINGS  Left Ventricle: Left ventricular ejection fraction, by estimation, is 65 to 70%. The left ventricle has normal function. The left ventricle has no regional wall motion abnormalities. The left ventricular internal cavity size was normal in size. There is  borderline left ventricular hypertrophy. Left ventricular  diastolic parameters are indeterminate. Right Ventricle: The right ventricular size is normal. No increase in right ventricular wall thickness. Right ventricular systolic function is normal. There is normal pulmonary artery systolic pressure. The tricuspid regurgitant velocity is 2.56 m/s, and  with an assumed right atrial pressure of 3 mmHg, the estimated right ventricular systolic pressure is [id number] mmHg. Left Atrium: Left atrial size was normal  in size. Right Atrium: Right atrial size was normal in size. Pericardium: A small pericardial effusion is present. The pericardial effusion is posterior to the left ventricle. Mitral Valve: The mitral valve is grossly normal. Moderate mitral annular calcification. Mild mitral valve regurgitation. MV peak gradient, 5.4 mmHg. The mean mitral valve gradient is 2.0 mmHg. Tricuspid Valve: The tricuspid valve is grossly normal. Tricuspid valve regurgitation is mild to moderate. Aortic Valve: The aortic valve is tricuspid. There is moderate calcification of the aortic valve. Aortic valve regurgitation is moderate. Aortic regurgitation PHT measures 330 msec. Mild aortic stenosis is present. Aortic valve mean gradient measures 9.5  mmHg. Aortic valve peak gradient measures 18.4 mmHg. Aortic valve area, by VTI measures 1.41 cm. Pulmonic Valve: The pulmonic valve was grossly normal. Pulmonic valve regurgitation is trivial. Aorta: The aortic root is normal in size and structure. Venous: The inferior vena cava is normal in size with greater than 50% respiratory variability, suggesting right atrial pressure of 3 mmHg. IAS/Shunts: No atrial level shunt detected by color flow Doppler.  LEFT VENTRICLE PLAX 2D LVIDd:         3.85 cm  Diastology LVIDs:         2.38 cm  LV e' medial:   7.10 cm/s LV PW:         1.04 cm  LV E/e' medial: 9.9 LV IVS:        1.07 cm LVOT diam:     1.90 cm LV SV:         64 LV SV Index:   35 LVOT Area:     2.84 cm  RIGHT VENTRICLE RV Basal diam:  3.30 cm RV Mid  diam:    3.49 cm RV S prime:     14.50 cm/s TAPSE (M-mode): 2.1 cm LEFT ATRIUM           Index       RIGHT ATRIUM           Index LA diam:      3.50 cm 1.89 cm/m  RA Area:     14.90 cm LA Vol (A2C): 52.0 ml 28.07 ml/m RA Volume:   32.60 ml  17.59 ml/m LA Vol (A4C): 40.8 ml 22.02 ml/m  AORTIC VALVE AV Area (Vmax):    1.47 cm AV Area (Vmean):   1.42 cm AV Area (VTI):     1.41 cm AV Vmax:           214.50 cm/s AV Vmean:          142.000 cm/s AV VTI:            0.456 m AV Peak Grad:      18.4 mmHg AV Mean Grad:      9.5 mmHg LVOT Vmax:         111.00 cm/s LVOT Vmean:        71.000 cm/s LVOT VTI:          0.227 m LVOT/AV VTI ratio: 0.50 AI PHT:            330 msec  AORTA Ao Root diam: 2.90 cm Ao Asc diam:  3.20 cm MITRAL VALVE               TRICUSPID VALVE MV Area (PHT): 4.41 cm    TR Peak grad:   26.2 mmHg MV Area VTI:   3.21 cm    TR Vmax:        256.00 cm/s MV Peak grad:  5.4  mmHg MV Mean grad:  2.0 mmHg    SHUNTS MV Vmax:       1.16 m/s    Systemic VTI:  0.23 m MV Vmean:      64.4 cm/s   Systemic Diam: 1.90 cm MV Decel Time: 172 msec MV E velocity: 70.60 cm/s MV A velocity: 95.20 cm/s MV E/A ratio:  0.74 Rozann Lesches MD Electronically signed by Rozann Lesches MD Signature Date/Time: 03/21/2021/11:57:00 AM    Final      Patient Profile     80 y.o. female with past medical history of coronary artery disease, supraventricular tachycardia, breast cancer, hyperlipidemia with chest pain.  Cardiac catheterization this admission shows 40 to 50% ostial left main not felt to be hemodynamically significant and 20% right coronary artery; mild aortic stenosis.  Echocardiogram shows normal LV function, small pericardial effusion, mild mitral regurgitation, mild to moderate tricuspid regurgitation, moderate aortic insufficiency, mild aortic stenosis with mean gradient 10 mmHg.  Also diagnosed with new onset atrial fibrillation.  Assessment & Plan    1 coronary artery disease-nonobstructive.  Will not treat  with aspirin.  We will treat with statin long-term.  2 paroxysmal atrial fibrillation-newly diagnosed.  Continue Cardizem for rate control if atrial fibrillation recurs.  Discontinue IV heparin.  Treat with apixaban 5 mg twice daily.  3 valvular heart disease-aortic stenosis/aortic insufficiency-she will need follow-up echocardiograms in the future.  4 Abnormal chest CT-admission CT demonstrated faint areas of groundglass opacity in the left lung and follow-up recommended in 3 to 6 months.  We will arrange outpatient noncontrast chest CT 3 to 6 months after discharge.  5 hyperlipidemia-given documented coronary disease we will discontinue Zocor and treat with moderate dose Crestor.  Check lipids and liver in 12 weeks with goal LDL less than 70.  6 history of supraventricular tachycardia  Plan discharge today on present medications.  Follow-up with APP in approximately 2 to 4 weeks.  Follow-up Dr. Percival Spanish in [place] in November as scheduled.  Greater than 30 minutes PA and physician time.  D2  For questions or updates, please contact [place] Please consult [email or web address] for contact info under        Signed, Kirk Ruths, MD  03/22/2021, 10:28 AM

## 2021-03-22 NOTE — Discharge Summary (Signed)
Discharge Summary    Patient ID: CAYLE DEPAUL MRN: [id number]; DOB: June 21, 1941  Admit date: 03/20/2021 Discharge date: 03/22/2021  PCP:  Dettinger, Fransisca Kaufmann, MD   [facility] Providers Cardiologist:  Minus Breeding, MD   {   Discharge Diagnoses    Principal Problem:   Unstable angina [facility]) Active Problems:   Mixed hyperlipidemia   Asthma   Diaphragmatic hernia   PSVT (paroxysmal supraventricular tachycardia) (HCC)   Chest pain   CAD (coronary artery disease)   Diagnostic Studies/Procedures    Left Cardiac cath 03/21/21 Coronary Pressure Wire/FFR w/3D Mapping  LEFT HEART CATH AND CORONARY ANGIOGRAPHY   Conclusion  Conclusions: Mild to moderate, non-obstructive coronary artery disease with 40-50% ostial LMCA stenosis that is not hemodynamically significant (RFR = 0.98) and 20% mid RCA disease. Mildly elevated left ventricular filling pressure. Mild aortic valve stenosis. Paroxysmal atrial fibrillation/flutter (appears to be a new diagnosis).   Recommendations: Medical therapy and risk factor modification to prevent progression of coronary artery disease. Initiate heparin infusion 2 hours after TR band removal.  Anticipate transition to DOAC tomorrow morning if no bleeding/vascular injury post-catheterization. Monitor overnight, given that progressive dyspnea and chest tightness may be due to atrial fibrillation observed during catheterization and may require further evaluation/management.   Nelva Bush, MD [facility]   Echo  03/21/21 1. Left ventricular ejection fraction, by estimation, is 65 to 70%. The  left ventricle has normal function. The left ventricle has no regional  wall motion abnormalities. Left ventricular diastolic parameters are  indeterminate.   2. Right ventricular systolic function is normal. The right ventricular  size is normal. There is normal pulmonary artery systolic pressure. The  estimated right ventricular systolic  pressure is [id number] mmHg.   3. A small pericardial effusion is present. The pericardial effusion is  posterior to the left ventricle.   4. The mitral valve is grossly normal. Mild mitral valve regurgitation.  Moderate mitral annular calcification.   5. Tricuspid valve regurgitation is mild to moderate.   6. The aortic valve is tricuspid. There is moderate calcification of the  aortic valve. Aortic valve regurgitation is moderate. Mild aortic valve  stenosis. Aortic regurgitation PHT measures 330 msec. Aortic valve mean  gradient measures 9.5 mmHg. Aortic  valve Vmax measures 2.14 m/s. Dimentionless index 0.50.   7. The inferior vena cava is normal in size with greater than 50%  respiratory variability, suggesting right atrial pressure of 3 mmHg.   Comparison(s): No significant change from prior study.  _____________    History of Present Illness     Bernedette Insinga Glendinning is a 80 y.o. female with past medical history of CAD status post coronary CT 09/27/1998 admitted to the time, SVT, breast cancer status postmastectomy, HLD was admitted for chest pain.  Patient was seen by Dr. Percival Spanish 11/2019 and reported worsening chest pain and dyspnea on exertion at that time which was resolved with sublingual nitroglycerin.  She did have known hiatal hernia but given her progressive symptoms, cardiac catheterization was recommended for evaluation.  Patient called the office 03/20/2021 reporting chest pain and it was advised go to the ED at [facility].  Reported chest tightness along with dyspnea on exertion for the past year. Chest pain can occur with activity.  Hospital Course     Consultants: None  In the ER troponin was negative. BNP 172. COVID-negative. D-dimer was 0.95.  CTA showed no PE but was noted to have areas of groundglass opacity in the left lung  with follow-up CT recommended in 3 to 6 months.  Was also noted to have large hiatal hernia.  EKG showed no acute ST changes.  Due to prior coronary  CT showing moderate plaque on the RCA and FFR significant in the distal LAD patient was eventually set up for cardiac catheterization at [facility].  Also started on IV Lasix for mild acute CHF exacerbation.  Patient was transferred to [facility] and placed on [facility] service.  Cardiac cath showed 40-50 ostial left main disease not felt to be hemodynamically significant and 20% RCA disease, with mild aortic stenosis.  At time of cath patient was diagnosed with new onset a flutter.  Echo showed normal LV function, small pericardial effusion, mild MR, mild to moderate tricuspid regurgitation, moderate aortic insufficiency, mild aortic stenosis with mean gradient 10 mmHg. Can follow valvular disease with serial echocardiogram. Patient will need noncontrast CT in 3-6 months to evaluate groundglass opacity int he left lung. Given new onset Afib, plan for Eliquis '5mg'$  BID. No Aspirin with Eliquis. Will continue Diltiazem 377mdaily and Crestor '20mg'$  daily.  The patient was evaluted by Dr. CStanford Breed8/19/22 and felt to be stable for discharge.   Did the patient have an acute coronary syndrome (MI, NSTEMI, STEMI, etc) this admission?:  No                               Did the patient have a percutaneous coronary intervention (stent / angioplasty)?:  No.       _____________  Discharge Vitals Blood pressure 105/64, pulse 89, temperature 98 F (36.7 C), temperature source Oral, resp. rate 19, height '5\' 3"'$  (1.6 m), weight 83.9 kg, SpO2 96 %.  Filed Weights   03/20/21 2010 03/21/21 0500 03/22/21 0539  Weight: 82.1 kg 82.1 kg 83.9 kg    Labs & Radiologic Studies    CBC Recent Labs    03/20/21 1226 03/22/21 0148  WBC 5.7 5.4  HGB 12.3 12.4  HCT 39.3 38.6  MCV 94.7 91.0  PLT 166 [id number]  Basic Metabolic Panel Recent Labs    03/21/21 0614 03/22/21 0148  NA 139 137  K 4.2 3.8  CL 103 100  CO2 29 26  GLUCOSE 92 86  BUN 14 11  CREATININE 0.64 0.82  CALCIUM 8.7* 8.9   Liver Function Tests No results  for input(s): AST, ALT, ALKPHOS, BILITOT, PROT, ALBUMIN in the last 72 hours. No results for input(s): LIPASE, AMYLASE in the last 72 hours. High Sensitivity Troponin:   Recent Labs  Lab 03/20/21 1226 03/20/21 1351  TROPONINIHS 6 6    BNP Invalid input(s): POCBNP D-Dimer Recent Labs    03/20/21 1226  DDIMER 0.95*   Hemoglobin A1C No results for input(s): HGBA1C in the last 72 hours. Fasting Lipid Panel Recent Labs    03/22/21 0148  CHOL 134  HDL 55  LDLCALC 62  TRIG 83  CHOLHDL 2.4   Thyroid Function Tests Recent Labs    03/22/21 0148  TSH 3.542   _____________  DG Chest 2 View  Result Date: 03/20/2021 CLINICAL DATA:  Chest pain, tightness, shortness of breath EXAM: CHEST - 2 VIEW COMPARISON:  None. FINDINGS: The cardiomediastinal silhouette is similar to the prior study. There is calcified atherosclerotic plaque of the aortic arch. The lungs remain hyperinflated consistent with COPD. There is no focal consolidation or pulmonary edema. There is no significant effusion. There is no pneumothorax. Wedge-shaped  opacity in the right apex is unchanged going back multiple prior studies and likely reflects scarring. Retrocardiac opacity is consistent with a large hiatal hernia as seen on prior CT. There is no acute osseous abnormality. IMPRESSION: 1. No radiographic evidence of acute cardiopulmonary process. 2. COPD. 3. Large hiatal hernia. Electronically Signed   By: Valetta Mole M.D.   On: 03/20/2021 13:12   CT Angio Chest Pulmonary Embolism (PE) W or WO Contrast  Result Date: 03/20/2021 CLINICAL DATA:  PE suspected, low/intermediate prob, positive D-dimer Chest pain and shortness of breath EXAM: CT ANGIOGRAPHY CHEST WITH CONTRAST TECHNIQUE: Multidetector CT imaging of the chest was performed using the standard protocol during bolus administration of intravenous contrast. Multiplanar CT image reconstructions and MIPs were obtained to evaluate the vascular anatomy. CONTRAST:  82m  OMNIPAQUE IOHEXOL 350 MG/ML SOLN COMPARISON:  Radiograph earlier today.  Cardiac CT 09/23/2019 FINDINGS: Cardiovascular: There are no filling defects within the pulmonary arteries to suggest pulmonary embolus. Next which shows atherosclerosis of the thoracic aorta with tortuosity. No dissection or acute findings. Heart is normal in size. Trace pericardial fluid. Mediastinum/Nodes: Large hiatal hernia with the majority of the stomach being intrathoracic. Esophagus mildly patulous. No enlarged mediastinal or hilar lymph nodes. No thyroid nodule. Lungs/Pleura: Subpleural consolidation in the periphery of the right upper lobe has some punctate internal calcifications, and appears slightly linear and bandlike on coronal reformats. This is partially included in previous coronary CT and appears grossly stable. There are faint areas of ground-glass opacity in the left lung, including series 6, image 14 at the lung apex, periphery of the upper lobe series 6, image 34 and 51. There is compressive atelectasis in the lower lobes related to hiatal hernia. No pulmonary edema. Small right pleural effusion. Upper Abdomen: Large hiatal hernia. No acute upper abdominal findings. Musculoskeletal: There are no acute or suspicious osseous abnormalities. Mild thoracic spondylosis. Right mastectomy. Review of the MIP images confirms the above findings. IMPRESSION: 1. No pulmonary embolus. 2. Faint areas of ground-glass opacity in the left lung are nonspecific, may be infectious or inflammatory, including COVID-19. Would recommend follow-up as a ground-glass nodules as follows: Non-contrast chest CT at 3-6 months is recommended. If nodules persist, subsequent management will be based upon the most suspicious nodule(s). This recommendation follows the consensus statement: Guidelines for Management of Incidental Pulmonary Nodules Detected on CT Images: From the Fleischner Society 2017; Radiology 2017; 284:228-243. 3. Small right pleural  effusion. 4. Subpleural consolidation in the periphery of the right upper lobe has a slightly linear and bandlike appearance and may be scarring. Given right mastectomy findings may be related to post radiation change. Given there are no prior exams to establish stability, this can be assessed on follow-up as recommended above. 5. Large hiatal hernia with the majority of the stomach being intrathoracic. Aortic Atherosclerosis (ICD10-I70.0). Electronically Signed   By: MKeith RakeM.D.   On: 03/20/2021 19:31   CARDIAC CATHETERIZATION  Result Date: 03/21/2021 Conclusions: Mild to moderate, non-obstructive coronary artery disease with 40-50% ostial LMCA stenosis that is not hemodynamically significant (RFR = 0.98) and 20% mid RCA disease. Mildly elevated left ventricular filling pressure. Mild aortic valve stenosis. Paroxysmal atrial fibrillation/flutter (appears to be a new diagnosis). Recommendations: Medical therapy and risk factor modification to prevent progression of coronary artery disease. Initiate heparin infusion 2 hours after TR band removal.  Anticipate transition to DOAC tomorrow morning if no bleeding/vascular injury post-catheterization. Monitor overnight, given that progressive dyspnea and chest tightness may be due  to atrial fibrillation observed during catheterization and may require further evaluation/management. Nelva Bush, MD [facility]  ECHOCARDIOGRAM COMPLETE  Result Date: 03/21/2021    ECHOCARDIOGRAM REPORT   Patient Name:   Krystal [name] Date of Exam: 03/21/2021 Medical Rec #:  [id number]          Height:       63.0 in Accession #:    [id number]         Weight:       180.9 lb Date of Birth:  July 23, 1941          BSA:          1.853 m Patient Age:    80 years           BP:           117/66 mmHg Patient Gender: F                  HR:           88 bpm. Exam Location:  [facility] Procedure: 2D Echo, Cardiac Doppler and Color Doppler Indications:    Dyspnea  History:         Patient has prior history of Echocardiogram examinations, most                 recent 05/30/2019. CAD, Abnormal ECG, Arrythmias:LBBB,                 Signs/Symptoms:Chest Pain and Shortness of Breath; Risk                 Factors:Dyslipidemia.  Sonographer:    Wenda Low Referring Phys: [phone] [facility]  Sonographer Comments: Breast CA-Rt breast mastectomy IMPRESSIONS  1. Left ventricular ejection fraction, by estimation, is 65 to 70%. The left ventricle has normal function. The left ventricle has no regional wall motion abnormalities. Left ventricular diastolic parameters are indeterminate.  2. Right ventricular systolic function is normal. The right ventricular size is normal. There is normal pulmonary artery systolic pressure. The estimated right ventricular systolic pressure is [id number] mmHg.  3. A small pericardial effusion is present. The pericardial effusion is posterior to the left ventricle.  4. The mitral valve is grossly normal. Mild mitral valve regurgitation. Moderate mitral annular calcification.  5. Tricuspid valve regurgitation is mild to moderate.  6. The aortic valve is tricuspid. There is moderate calcification of the aortic valve. Aortic valve regurgitation is moderate. Mild aortic valve stenosis. Aortic regurgitation PHT measures 330 msec. Aortic valve mean gradient measures 9.5 mmHg. Aortic valve Vmax measures 2.14 m/s. Dimentionless index 0.50.  7. The inferior vena cava is normal in size with greater than 50% respiratory variability, suggesting right atrial pressure of 3 mmHg. Comparison(s): No significant change from prior study. FINDINGS  Left Ventricle: Left ventricular ejection fraction, by estimation, is 65 to 70%. The left ventricle has normal function. The left ventricle has no regional wall motion abnormalities. The left ventricular internal cavity size was normal in size. There is  borderline left ventricular hypertrophy. Left ventricular diastolic parameters are indeterminate.  Right Ventricle: The right ventricular size is normal. No increase in right ventricular wall thickness. Right ventricular systolic function is normal. There is normal pulmonary artery systolic pressure. The tricuspid regurgitant velocity is 2.56 m/s, and  with an assumed right atrial pressure of 3 mmHg, the estimated right ventricular systolic pressure is [id number] mmHg. Left Atrium: Left atrial size was normal in size. Right Atrium: Right atrial size was normal in  size. Pericardium: A small pericardial effusion is present. The pericardial effusion is posterior to the left ventricle. Mitral Valve: The mitral valve is grossly normal. Moderate mitral annular calcification. Mild mitral valve regurgitation. MV peak gradient, 5.4 mmHg. The mean mitral valve gradient is 2.0 mmHg. Tricuspid Valve: The tricuspid valve is grossly normal. Tricuspid valve regurgitation is mild to moderate. Aortic Valve: The aortic valve is tricuspid. There is moderate calcification of the aortic valve. Aortic valve regurgitation is moderate. Aortic regurgitation PHT measures 330 msec. Mild aortic stenosis is present. Aortic valve mean gradient measures 9.5  mmHg. Aortic valve peak gradient measures 18.4 mmHg. Aortic valve area, by VTI measures 1.41 cm. Pulmonic Valve: The pulmonic valve was grossly normal. Pulmonic valve regurgitation is trivial. Aorta: The aortic root is normal in size and structure. Venous: The inferior vena cava is normal in size with greater than 50% respiratory variability, suggesting right atrial pressure of 3 mmHg. IAS/Shunts: No atrial level shunt detected by color flow Doppler.  LEFT VENTRICLE PLAX 2D LVIDd:         3.85 cm  Diastology LVIDs:         2.38 cm  LV e' medial:   7.10 cm/s LV PW:         1.04 cm  LV E/e' medial: 9.9 LV IVS:        1.07 cm LVOT diam:     1.90 cm LV SV:         64 LV SV Index:   35 LVOT Area:     2.84 cm  RIGHT VENTRICLE RV Basal diam:  3.30 cm RV Mid diam:    3.49 cm RV S prime:     14.50 cm/s  TAPSE (M-mode): 2.1 cm LEFT ATRIUM           Index       RIGHT ATRIUM           Index LA diam:      3.50 cm 1.89 cm/m  RA Area:     14.90 cm LA Vol (A2C): 52.0 ml 28.07 ml/m RA Volume:   32.60 ml  17.59 ml/m LA Vol (A4C): 40.8 ml 22.02 ml/m  AORTIC VALVE AV Area (Vmax):    1.47 cm AV Area (Vmean):   1.42 cm AV Area (VTI):     1.41 cm AV Vmax:           214.50 cm/s AV Vmean:          142.000 cm/s AV VTI:            0.456 m AV Peak Grad:      18.4 mmHg AV Mean Grad:      9.5 mmHg LVOT Vmax:         111.00 cm/s LVOT Vmean:        71.000 cm/s LVOT VTI:          0.227 m LVOT/AV VTI ratio: 0.50 AI PHT:            330 msec  AORTA Ao Root diam: 2.90 cm Ao Asc diam:  3.20 cm MITRAL VALVE               TRICUSPID VALVE MV Area (PHT): 4.41 cm    TR Peak grad:   26.2 mmHg MV Area VTI:   3.21 cm    TR Vmax:        256.00 cm/s MV Peak grad:  5.4 mmHg MV Mean grad:  2.0 mmHg  SHUNTS MV Vmax:       1.16 m/s    Systemic VTI:  0.23 m MV Vmean:      64.4 cm/s   Systemic Diam: 1.90 cm MV Decel Time: 172 msec MV E velocity: 70.60 cm/s MV A velocity: 95.20 cm/s MV E/A ratio:  0.74 Rozann Lesches MD Electronically signed by Rozann Lesches MD Signature Date/Time: 03/21/2021/11:57:00 AM    Final    DG [facility] DEXA  Result Date: 03/02/2021 CLINICAL DATA:  Postmenopausal. EXAM: DUAL X-RAY ABSORPTIOMETRY (DXA) FOR BONE MINERAL DENSITY TECHNIQUE: Bone mineral density measurements are performed of the spine, hip, and forearm, as appropriate, per International Society of Clinical Densitometry recommendations. The pertinent regions of interest are reported below. Non-contributory values are not reported. Images are obtained for bone mineral density measurement and are not obtained for diagnostic purposes. FINDINGS: AP LUMBAR SPINE L1-3 Bone Mineral Density (BMD):  0.910 g/cm2 Young Adult T-Score:  -2.2 Z-Score:  -0.8 LEFT FEMUR NECK Bone Mineral Density (BMD):  0.646 g/cm2 Young Adult T-Score: -2.8 Z-Score:  -0.9 Unit: This study was  performed at [facility] on a [place]. Scan quality: The scan quality is good. Exclusions: L4 ASSESSMENT: Patient's diagnostic category is OSTEOPOROSIS by WHO Criteria. FRACTURE RISK: INCREASED FRAX: World Health Organization FRAX assessment of absolute fracture risk is not calculated for this patient because the patient has osteoporosis. COMPARISON: July 13, 2013 The bone mineral density of the lumbar spine from L1 through L3 has increased 6.2% since baseline which is statistically significant. The bone mineral density of the total mean hips has increased 8.1% since baseline which is statistically significant. RECOMMENDATIONS 1. All patients should optimize calcium and vitamin D intake. 2. Consider FDA-approved medical therapies in postmenopausal women and men aged 34 years and older, based on the following: - A hip or vertebral (clinical or morphometric) fracture - T-score less than or equal to -2.5 at the femoral neck or spine after appropriate evaluation to exclude secondary causes - Low bone mass (T-score between -1.0 and -2.5 at the femoral neck or spine) and a 10-year probability of a hip fracture greater than or equal to 3% or a 10-year probability of a major osteoporosis-related fracture greater than or equal to 20% based on the [place]-adapted WHO algorithm - Clinician judgment and/or patient preferences may indicate treatment for people with 10-year fracture probabilities above or below these levels 3. Patients with diagnosis of osteoporosis or at high risk for fracture should have regular bone mineral density tests. For patients eligible for Medicare, routine testing is allowed once every 2 years. The testing frequency can be increased to one year for patients who have rapidly progressing disease, those who are receiving or discontinuing medical therapy to restore bone mass, or have additional risk factors. Electronically Signed   By: Dorise Bullion III M.D   On:  03/02/2021 12:10   Disposition   Pt is being discharged home today in good condition.  Follow-up Plans & Appointments     Follow-up Information     Verta Ellen., NP Follow up on 04/10/2021.   Specialty: Cardiology Why: '@10AM'$  Contact information: [address] [place] [address] [phone]                  Discharge Medications   Allergies as of 03/22/2021       Reactions   Actonel [risedronate] Nausea And Vomiting, Other (See Comments)   Throat tightness   Fosamax [alendronate Sodium]  Other (See Comments)   esophagitis   Mevacor [lovastatin] Other (See Comments)   myalgias   Miacalcin [calcitonin (salmon)] Nausea And Vomiting   Mobic [meloxicam] Hives, Itching   rash        Medication List     STOP taking these medications    aspirin EC 81 MG tablet   diazepam 5 MG tablet Commonly known as: VALIUM   Prolia 60 MG/ML Sosy injection Generic drug: denosumab   simvastatin 20 MG tablet Commonly known as: ZOCOR       TAKE these medications    albuterol 108 (90 Base) MCG/ACT inhaler Commonly known as: VENTOLIN HFA INHALE TWO PUFFS BY MOUTH 4 TIMES DAILY AS NEEDED What changed: See the new instructions.   apixaban 5 MG Tabs tablet Commonly known as: ELIQUIS Take 1 tablet (5 mg total) by mouth 2 (two) times daily.   Calcium Citrate-Vitamin D 315-250 MG-UNIT Tabs Commonly known as: Calcium Citrate + D Take 2 tablets by mouth daily.   cetirizine 10 MG tablet Commonly known as: ZYRTEC Take 5-10 mg by mouth at bedtime.   diclofenac Sodium 1 % Gel Commonly known as: Voltaren Apply 2 g topically 4 (four) times daily.   diltiazem 300 MG 24 hr capsule Commonly known as: CARDIZEM CD Take 1 capsule (300 mg total) by mouth daily.   esomeprazole 40 MG capsule Commonly known as: NEXIUM TAKE 1 CAPSULE BY MOUTH EVERY DAY   ferrous sulfate 325 (65 FE) MG tablet Take 1 tablet (325 mg total) by mouth at bedtime.    ipratropium-albuterol 0.5-2.5 (3) MG/3ML Soln Commonly known as: DUONEB Take 3 mLs by nebulization every 6 (six) hours as needed. Dx:  Asthma 493.90   metoprolol tartrate 25 MG tablet Commonly known as: LOPRESSOR TAKE 1 TABLET (25 MG TOTAL) BY MOUTH DAILY AS NEEDED.   montelukast 10 MG tablet Commonly known as: SINGULAIR One tablet by mouth daily at bedtime   nitroGLYCERIN 0.4 MG/SPRAY spray Commonly known as: NITROLINGUAL ONE SPRAY UNDER TONGUE AS NEED FOR CHESTPAIN. MAY REPEAT IN 5 MINUTES IF PAIN NOT RELIEVED CALL 911.   PRESERVISION/LUTEIN PO Take 1 capsule by mouth at bedtime.   Restasis 0.05 % ophthalmic emulsion Generic drug: cycloSPORINE Place 1 drop into both eyes 2 (two) times daily.   rosuvastatin 20 MG tablet Commonly known as: CRESTOR Take 1 tablet (20 mg total) by mouth daily. Start taking on: March 23, 2021   Systane 0.4-0.3 % Soln Generic drug: Polyethyl Glycol-Propyl Glycol Place 1 drop into both eyes 2 (two) times daily.   Vitamin D 50 MCG (2000 UT) Caps Take 1 capsule by mouth at bedtime.           Outstanding Labs/Studies   N/A  Duration of Discharge Encounter   Greater than 30 minutes including physician time.  Signed, Elfreda Blanchet Ninfa Meeker, PA-C 03/22/2021, 11:53 AM

## 2021-03-22 NOTE — Progress Notes (Signed)
ANTICOAGULATION CONSULT NOTE - Follow Up Consult  Pharmacy Consult for Heparin Indication: atrial fibrillation  Allergies  Allergen Reactions   Actonel [Risedronate] Nausea And Vomiting and Other (See Comments)    Throat tightness   Fosamax [Alendronate Sodium] Other (See Comments)    esophagitis   Mevacor [Lovastatin] Other (See Comments)    myalgias   Miacalcin [Calcitonin ([place])] Nausea And Vomiting   Mobic [Meloxicam] Hives and Itching    rash    Patient Measurements: Height: '5\' 3"'$  (160 cm) Weight: 83.9 kg (184 lb 15.5 oz) IBW/kg (Calculated) : 52.4 Heparin Dosing Weight: 70kg  Vital Signs: Temp: 98 F (36.7 C) (08/19 0539) Temp Source: Oral (08/19 0539) BP: 105/64 (08/19 0539) Pulse Rate: 89 (08/19 0539)  Labs: Recent Labs    03/20/21 1226 03/20/21 1351 03/21/21 0614 03/22/21 0148 03/22/21 0822  HGB 12.3  --   --  12.4  --   HCT 39.3  --   --  38.6  --   PLT 166  --   --  159  --   HEPARINUNFRC  --   --   --  0.17* 0.27*  CREATININE 0.75  --  0.64 0.82  --   TROPONINIHS 6 6  --   --   --      Estimated Creatinine Clearance: 55.2 mL/min (by C-G formula based on SCr of 0.82 mg/dL).    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 No anticoagulation prior to admission. Pharmacy consulted to dose heparin.  -heparin level= 0.27 on 1000 units/hr, CBC stable   Goal of Therapy:  Heparin level 0.3-0.7 units/ml Monitor platelets by anticoagulation protocol: Yes   Plan:   Increase heparin to 1000 units/hr Monitor daily HL, CBC/plt Will follow plans for oral anticoagulation  Hildred Laser, PharmD Clinical Pharmacist **Pharmacist phone directory can now be found on [email or web address] ([email or web address]).  Listed under [place].

## 2021-03-25 ENCOUNTER — Telehealth: Payer: Self-pay | Admitting: Family Medicine

## 2021-03-25 NOTE — Telephone Encounter (Signed)
Ok sounds good

## 2021-03-26 NOTE — Telephone Encounter (Signed)
NA/NVM, where is pt going to get this filled at, is she going to pay out of pocket or use her insurance

## 2021-03-27 ENCOUNTER — Telehealth: Payer: Self-pay | Admitting: Family Medicine

## 2021-03-27 MED ORDER — IPRATROPIUM-ALBUTEROL 0.5-2.5 (3) MG/3ML IN SOLN: 3.0000 mL | Freq: Four times a day (QID) | RESPIRATORY_TRACT | 0 refills | Status: DC | PRN

## 2021-03-27 NOTE — Telephone Encounter (Signed)
Patient called stating that she was d/c from hospital this past weekend. States that her dc papers said not to take her Valium. She has woke up very nervous and wants to know if taking one Valium will do anything to her heart.

## 2021-03-27 NOTE — Telephone Encounter (Signed)
  Prescription Request  03/27/2021  Is this a "Controlled Substance" medicine? Have you seen your PCP in the last 2 weeks? If YES, route message to pool  -  If NO, patient needs to be seen.  What is the name of the medication or equipment? Needs Ipratropium-albuterol 0.5-2.5 MG/3ML Soln and needs the hose and the piece fits that fits over her mouth and nose  Have you contacted your pharmacy to request a refill? YES  Which pharmacy would you like this sent to? [facility] in [place]   Patient notified that their request is being sent to the clinical staff for review and that they should receive a response within 2 business days.

## 2021-03-27 NOTE — Telephone Encounter (Signed)
Pt informed that if the medication is prescribed to her and she is going to stay at home that she may go ahead and take her medication.  Pt states that she did not sleep well. She is nervous and her heart is beating fast.

## 2021-03-27 NOTE — Telephone Encounter (Signed)
Called pt to discuss. I advised her to call and discuss diazepam use with her PCP's office. She agrees and will call.

## 2021-03-27 NOTE — Telephone Encounter (Signed)
Refill sent to pharmacy.   

## 2021-03-27 NOTE — Telephone Encounter (Signed)
Pt talked to Krystal [name] this morning, who corresponded with me. Pt is going to use her insurance to pay for her 4 wheeled rolling walker. She will need a F2F for documentation. Dr. Warrick Parisian did not have any openings for at least 2 weeks out. She was made an appt for 03/28/21 with Sharyn Lull since pt needs this to get around with.

## 2021-03-28 ENCOUNTER — Other Ambulatory Visit: Payer: Self-pay

## 2021-03-28 ENCOUNTER — Encounter: Payer: Self-pay | Admitting: Family Medicine

## 2021-03-28 ENCOUNTER — Ambulatory Visit (INDEPENDENT_AMBULATORY_CARE_PROVIDER_SITE_OTHER): Payer: Medicare HMO | Admitting: Family Medicine

## 2021-03-28 VITALS — BP 115/62 | HR 99 | Temp 97.0°F | Resp 20 | Ht 63.0 in | Wt 186.0 lb

## 2021-03-28 DIAGNOSIS — I480 Paroxysmal atrial fibrillation: Secondary | ICD-10-CM | POA: Insufficient documentation

## 2021-03-28 DIAGNOSIS — J4520 Mild intermittent asthma, uncomplicated: Secondary | ICD-10-CM | POA: Diagnosis not present

## 2021-03-28 DIAGNOSIS — R0602 Shortness of breath: Secondary | ICD-10-CM | POA: Diagnosis not present

## 2021-03-28 DIAGNOSIS — R2681 Unsteadiness on feet: Secondary | ICD-10-CM | POA: Insufficient documentation

## 2021-03-28 DIAGNOSIS — Z9181 History of falling: Secondary | ICD-10-CM | POA: Diagnosis not present

## 2021-03-28 DIAGNOSIS — M533 Sacrococcygeal disorders, not elsewhere classified: Secondary | ICD-10-CM

## 2021-03-28 DIAGNOSIS — H548 Legal blindness, as defined in USA: Secondary | ICD-10-CM

## 2021-03-28 DIAGNOSIS — G8929 Other chronic pain: Secondary | ICD-10-CM | POA: Diagnosis not present

## 2021-03-28 MED ORDER — NEBULIZER/TUBING/MOUTHPIECE KIT: PACK | 1 refills | Status: DC

## 2021-03-28 NOTE — Progress Notes (Signed)
Subjective:  Patient ID: Krystal [name], female    DOB: [birth year], 80 y.o.   MRN: [id number]  Patient Care Team: Dettinger, Fransisca Kaufmann, MD as PCP - General (Family Medicine) Minus Breeding, MD as PCP - Cardiology (Cardiology) Milus Banister, MD (Gastroenterology) Sherlynn Stalls, MD as Consulting Physician (Ophthalmology) Satira Sark, MD as Consulting Physician (Cardiology) Okey Regal, OD as Consulting Physician (Optometry) Minus Breeding, MD as Consulting Physician (Cardiology)   Chief Complaint:  face to face  (For walker )   HPI: Krystal [name] is a 80 y.o. female presenting on 03/28/2021 for face to face  (For walker )   Pt presents today for face-to-face visit to obtain rolling walker. 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 She has had falls recently, no injuries reported. She has general weakness and gait instability which puts her at greater risks for recurrent falls. She is using a walking stick today but still has to have assistance from staff to get to exam room.  She was recently admitted to the hospital and underwent cardiac cath which revealed mild to moderate non-obstructive CAD, no stents or angioplasty performed. She was noted to have PAF during admission and was placed on Eliquis. She denies chest pain, worsening shortness of breath, palpitations, lower extremity swelling, confusion, or abnormal bleeding or bruising.  She also reports she needs new tubing for her nebulizer machine as hers is old and dirty.    Relevant past medical, surgical, family, and social history reviewed and updated as indicated.  Allergies and medications reviewed and updated. Data reviewed: Chart in Epic.   Past Medical History:  Diagnosis Date   Anemia    Asthma    Brain tumor ([place])    1986   Breast cancer ([place])    Right breast   Cataract    Colon polyps    COPD (chronic obstructive pulmonary disease)  ([facility])    Hiatal hernia    Hyperlipidemia    Leg edema    Macular degeneration    Osteoporosis    Pneumonia    PVD (posterior vitreous detachment)    Right knee pain    Shingles rash     Past Surgical History:  Procedure Laterality Date   CATARACT EXTRACTION, BILATERAL     COLONOSCOPY     CORONARY PRESSURE WIRE/FFR WITH 3D MAPPING N/A 03/21/2021   Procedure: Coronary Pressure Wire/FFR w/3D Mapping;  Surgeon: Nelva Bush, MD;  Location: [facility];  Service: Cardiovascular;  Laterality: N/A;   CRANIECTOMY / CRANIOTOMY FOR EXCISION OF BRAIN TUMOR     Left side brain   LEFT HEART CATH AND CORONARY ANGIOGRAPHY N/A 03/21/2021   Procedure: LEFT HEART CATH AND CORONARY ANGIOGRAPHY;  Surgeon: Nelva Bush, MD;  Location: [facility];  Service: Cardiovascular;  Laterality: N/A;   MASTECTOMY Right    PARTIAL HYSTERECTOMY     POLYPECTOMY     SKIN CANCER EXCISION Left    Under left eye    Social History   Socioeconomic History   Marital status: Widowed    Spouse name: Not on file   Number of children: 1   Years of education: 7th   Highest education level: 7th grade  Occupational History   Occupation: retired  Tobacco Use   Smoking status: Former    Types: Cigarettes    Quit date: 02/09/1961    Years since quitting: 60.1  Smokeless tobacco: Never  Vaping Use   Vaping Use: Never used  Substance and Sexual Activity   Alcohol use: No    Alcohol/week: 0.0 standard drinks   Drug use: No   Sexual activity: Not Currently  Other Topics Concern   Not on file  Social History Narrative   Son lives with patient.   Retired from farming.   Highest level of education:  7th   Social Determinants of Health   Financial Resource Strain: Low Risk    Difficulty of Paying Living Expenses: Not hard at all  Food Insecurity: No Food Insecurity   Worried About Charity fundraiser in the Last Year: Never true   Arboriculturist in the Last Year: Never true  Transportation  Needs: No Transportation Needs   Lack of Transportation (Medical): No   Lack of Transportation (Non-Medical): No  Physical Activity: Inactive   Days of Exercise per Week: 0 days   Minutes of Exercise per Session: 0 min  Stress: No Stress Concern Present   Feeling of Stress : Not at all  Social Connections: Moderately Integrated   Frequency of Communication with Friends and Family: More than three times a week   Frequency of Social Gatherings with Friends and Family: More than three times a week   Attends Religious Services: More than 4 times per year   Active Member of [employer] or Organizations: Yes   Attends Archivist Meetings: More than 4 times per year   Marital Status: Widowed  Intimate Partner Violence: Not At Risk   Fear of Current or Ex-Partner: No   Emotionally Abused: No   Physically Abused: No   Sexually Abused: No    Outpatient Encounter Medications as of 03/28/2021  Medication Sig   albuterol (VENTOLIN HFA) 108 (90 Base) MCG/ACT inhaler INHALE TWO PUFFS BY MOUTH 4 TIMES DAILY AS NEEDED (Patient taking differently: Inhale 2 puffs into the lungs every 4 (four) hours as needed for wheezing or shortness of breath.)   apixaban (ELIQUIS) 5 MG TABS tablet Take 1 tablet (5 mg total) by mouth 2 (two) times daily.   Calcium Citrate-Vitamin D (CALCIUM CITRATE + D) 315-250 MG-UNIT TABS Take 2 tablets by mouth daily.   cetirizine (ZYRTEC) 10 MG tablet Take 5-10 mg by mouth at bedtime.    Cholecalciferol (VITAMIN D) 2000 UNITS CAPS Take 1 capsule by mouth at bedtime.   diclofenac Sodium (VOLTAREN) 1 % GEL Apply 2 g topically 4 (four) times daily.   diltiazem (CARDIZEM CD) 300 MG 24 hr capsule Take 1 capsule (300 mg total) by mouth daily.   esomeprazole (NEXIUM) 40 MG capsule TAKE 1 CAPSULE BY MOUTH EVERY DAY   ferrous sulfate 325 (65 FE) MG tablet Take 1 tablet (325 mg total) by mouth at bedtime.   ipratropium-albuterol (DUONEB) 0.5-2.5 (3) MG/3ML SOLN Take 3 mLs by nebulization  every 6 (six) hours as needed. Dx:  Asthma 493.90   metoprolol tartrate (LOPRESSOR) 25 MG tablet TAKE 1 TABLET (25 MG TOTAL) BY MOUTH DAILY AS NEEDED.   montelukast (SINGULAIR) 10 MG tablet One tablet by mouth daily at bedtime   Multiple Vitamins-Minerals (PRESERVISION/LUTEIN PO) Take 1 capsule by mouth at bedtime.    nitroGLYCERIN (NITROLINGUAL) 0.4 MG/SPRAY spray ONE SPRAY UNDER TONGUE AS NEED FOR CHESTPAIN. MAY REPEAT IN 5 MINUTES IF PAIN NOT RELIEVED CALL 911.   Polyethyl Glycol-Propyl Glycol (SYSTANE) 0.4-0.3 % SOLN Place 1 drop into both eyes 2 (two) times daily.   Respiratory Therapy  Supplies (NEBULIZER/TUBING/MOUTHPIECE) KIT Use as directed with Nebulizer medications   RESTASIS 0.05 % ophthalmic emulsion Place 1 drop into both eyes 2 (two) times daily.   rosuvastatin (CRESTOR) 20 MG tablet Take 1 tablet (20 mg total) by mouth daily.   No facility-administered encounter medications on file as of 03/28/2021.    Allergies  Allergen Reactions   Actonel [Risedronate] Nausea And Vomiting and Other (See Comments)    Throat tightness   Fosamax [Alendronate Sodium] Other (See Comments)    esophagitis   Mevacor [Lovastatin] Other (See Comments)    myalgias   Miacalcin [Calcitonin ([place])] Nausea And Vomiting   Mobic [Meloxicam] Hives and Itching    rash    Review of Systems  Constitutional:  Negative for activity change, appetite change, chills, diaphoresis, fatigue, fever and unexpected weight change.  HENT: Negative.    Eyes:  Positive for visual disturbance (legally blind).  Respiratory:  Positive for wheezing (at times). Negative for apnea, cough, choking, chest tightness, shortness of breath (intermittent) and stridor.   Cardiovascular:  Negative for chest pain, palpitations and leg swelling.  Gastrointestinal:  Negative for abdominal pain, blood in stool, constipation, diarrhea, nausea and vomiting.  Endocrine: Negative.   Genitourinary:  Negative for decreased urine volume,  dysuria, frequency and urgency.  Musculoskeletal:  Positive for arthralgias, back pain and gait problem. Negative for joint swelling, myalgias, neck pain and neck stiffness.  Skin: Negative.   Allergic/Immunologic: Negative.   Neurological:  Positive for weakness (generalized). Negative for dizziness, tremors, seizures, syncope, facial asymmetry, speech difficulty, light-headedness, numbness and headaches.  Hematological: Negative.  Does not bruise/bleed easily.  Psychiatric/Behavioral:  Negative for confusion, hallucinations, sleep disturbance and suicidal ideas.   All other systems reviewed and are negative.      Objective:  BP 115/62   Pulse 99   Temp (!) 97 F (36.1 C)   Resp 20   Ht 5' 3"  (1.6 m)   Wt 186 lb (84.4 kg)   SpO2 95%   BMI 32.95 kg/m    Wt Readings from Last 3 Encounters:  03/28/21 186 lb (84.4 kg)  03/22/21 184 lb 15.5 oz (83.9 kg)  02/06/21 172 lb (78 kg)    Physical Exam Vitals and nursing note reviewed.  Constitutional:      General: She is not in acute distress.    Appearance: Normal appearance. She is well-developed and well-groomed. She is obese. She is not ill-appearing, toxic-appearing or diaphoretic.  HENT:     Head: Normocephalic and atraumatic.     Jaw: There is normal jaw occlusion.     Right Ear: Hearing normal.     Left Ear: Hearing normal.     Nose: Nose normal.     Mouth/Throat:     Lips: Pink.     Mouth: Mucous membranes are moist.     Pharynx: Oropharynx is clear. Uvula midline.  Eyes:     General: Lids are normal.     Pupils: Pupils are equal, round, and reactive to light.     Comments: Pt legally blind  Neck:     Thyroid: No thyroid mass, thyromegaly or thyroid tenderness.     Vascular: No carotid bruit or JVD.     Trachea: Trachea and phonation normal.  Cardiovascular:     Rate and Rhythm: Normal rate and regular rhythm.     Chest Wall: PMI is not displaced.     Pulses: Normal pulses.     Heart sounds: Normal heart sounds.  No  murmur heard.   No friction rub. No gallop.  Pulmonary:     Effort: Pulmonary effort is normal. No respiratory distress.     Breath sounds: Normal breath sounds. No wheezing.  Abdominal:     General: Bowel sounds are normal. There is no distension or abdominal bruit.     Palpations: Abdomen is soft. There is no hepatomegaly or splenomegaly.     Tenderness: There is no abdominal tenderness. There is no right CVA tenderness or left CVA tenderness.     Hernia: No hernia is present.  Musculoskeletal:        General: Normal range of motion.     Cervical back: Normal range of motion and neck supple.     Right lower leg: No edema.     Left lower leg: No edema.  Lymphadenopathy:     Cervical: No cervical adenopathy.  Skin:    General: Skin is warm and dry.     Capillary Refill: Capillary refill takes less than 2 seconds.     Coloration: Skin is not cyanotic, jaundiced or pale.     Findings: No rash.  Neurological:     General: No focal deficit present.     Mental Status: She is alert and oriented to person, place, and time.     Cranial Nerves: Cranial nerves are intact. No cranial nerve deficit.     Sensory: Sensation is intact. No sensory deficit.     Motor: Weakness (generalized) present.     Coordination: Coordination is intact. Coordination normal.     Gait: Gait abnormal (unstedy, slow).     Deep Tendon Reflexes: Reflexes are normal and symmetric. Reflexes normal.  Psychiatric:        Attention and Perception: Attention and perception normal.        Mood and Affect: Mood and affect normal.        Speech: Speech normal.        Behavior: Behavior normal. Behavior is cooperative.        Thought Content: Thought content normal.        Cognition and Memory: Cognition and memory normal.        Judgment: Judgment normal.    Results for orders placed or performed during the hospital encounter of 03/20/21  Resp Panel by RT-PCR (Flu A&B, Covid) Nasopharyngeal Swab   Specimen:  Nasopharyngeal Swab; Nasopharyngeal(NP) swabs in vial transport medium  Result Value Ref Range   SARS Coronavirus 2 by RT PCR NEGATIVE NEGATIVE   Influenza A by PCR NEGATIVE NEGATIVE   Influenza B by PCR NEGATIVE NEGATIVE  Basic metabolic panel  Result Value Ref Range   Sodium 137 135 - 145 mmol/L   Potassium 4.1 3.5 - 5.1 mmol/L   Chloride 107 98 - 111 mmol/L   CO2 26 22 - 32 mmol/L   Glucose, Bld 104 (H) 70 - 99 mg/dL   BUN 22 8 - 23 mg/dL   Creatinine, Ser 0.75 0.44 - 1.00 mg/dL   Calcium 8.7 (L) 8.9 - 10.3 mg/dL   GFR, Estimated >60 >60 mL/min   Anion gap 4 (L) 5 - 15  CBC  Result Value Ref Range   WBC 5.7 4.0 - 10.5 K/uL   RBC 4.15 3.87 - 5.11 MIL/uL   Hemoglobin 12.3 12.0 - 15.0 g/dL   HCT 39.3 36.0 - 46.0 %   MCV 94.7 80.0 - 100.0 fL   MCH 29.6 26.0 - 34.0 pg   MCHC 31.3 30.0 - 36.0 g/dL  RDW 13.5 11.5 - 15.5 %   Platelets 166 150 - 400 K/uL   nRBC 0.0 0.0 - 0.2 %  Brain natriuretic peptide  Result Value Ref Range   B Natriuretic Peptide 172.0 (H) 0.0 - 100.0 pg/mL  D-dimer, quantitative  Result Value Ref Range   D-Dimer, Quant 0.95 (H) 0.00 - 0.50 ug/mL-FEU  Basic metabolic panel  Result Value Ref Range   Sodium 139 135 - 145 mmol/L   Potassium 4.2 3.5 - 5.1 mmol/L   Chloride 103 98 - 111 mmol/L   CO2 29 22 - 32 mmol/L   Glucose, Bld 92 70 - 99 mg/dL   BUN 14 8 - 23 mg/dL   Creatinine, Ser 0.64 0.44 - 1.00 mg/dL   Calcium 8.7 (L) 8.9 - 10.3 mg/dL   GFR, Estimated >60 >60 mL/min   Anion gap 7 5 - 15  Basic metabolic panel  Result Value Ref Range   Sodium 137 135 - 145 mmol/L   Potassium 3.8 3.5 - 5.1 mmol/L   Chloride 100 98 - 111 mmol/L   CO2 26 22 - 32 mmol/L   Glucose, Bld 86 70 - 99 mg/dL   BUN 11 8 - 23 mg/dL   Creatinine, Ser 0.82 0.44 - 1.00 mg/dL   Calcium 8.9 8.9 - 10.3 mg/dL   GFR, Estimated >60 >60 mL/min   Anion gap 11 5 - 15  CBC  Result Value Ref Range   WBC 5.4 4.0 - 10.5 K/uL   RBC 4.24 3.87 - 5.11 MIL/uL   Hemoglobin 12.4 12.0 -  15.0 g/dL   HCT 38.6 36.0 - 46.0 %   MCV 91.0 80.0 - 100.0 fL   MCH 29.2 26.0 - 34.0 pg   MCHC 32.1 30.0 - 36.0 g/dL   RDW 13.4 11.5 - 15.5 %   Platelets 159 150 - 400 K/uL   nRBC 0.0 0.0 - 0.2 %  TSH  Result Value Ref Range   TSH 3.542 0.350 - 4.500 uIU/mL  Heparin level (unfractionated)  Result Value Ref Range   Heparin Unfractionated 0.17 (L) 0.30 - 0.70 IU/mL  Lipid panel  Result Value Ref Range   Cholesterol 134 0 - 200 mg/dL   Triglycerides 83 <150 mg/dL   HDL 55 >40 mg/dL   Total CHOL/HDL Ratio 2.4 RATIO   VLDL 17 0 - 40 mg/dL   LDL Cholesterol 62 0 - 99 mg/dL  Heparin level (unfractionated)  Result Value Ref Range   Heparin Unfractionated 0.27 (L) 0.30 - 0.70 IU/mL  POCT Activated clotting time  Result Value Ref Range   Activated Clotting Time 254 seconds  ECHOCARDIOGRAM COMPLETE  Result Value Ref Range   Weight 2,894.2 oz   Height 63 in   BP 148/69 mmHg   AR max vel 1.47 cm2   AV Area VTI 1.41 cm2   AV Mean grad 9.5 mmHg   AV Peak grad 18.4 mmHg   Ao pk vel 2.15 m/s   AV Area mean vel 1.42 cm2   MV VTI 3.21 cm2   Area-P 1/2 4.41 cm2   S' Lateral 2.38 cm   P 1/2 time 330 msec  Troponin I (High Sensitivity)  Result Value Ref Range   Troponin I (High Sensitivity) 6 <18 ng/L  Troponin I (High Sensitivity)  Result Value Ref Range   Troponin I (High Sensitivity) 6 <18 ng/L       Pertinent labs & imaging results that were available during my care of the patient  were reviewed by me and considered in my medical decision making.  Assessment & Plan:  Claudeen was seen today for face to face .  Diagnoses and all orders for this visit:  Legally blind At risk for falling Gait instability Chronic right SI joint pain Increased risks for falls. Will write for rolling walker today.  -     For home use only DME 4 wheeled rolling walker with seat ([id number])  SOB (shortness of breath) Mild intermittent asthma without complication Needs new tubing and  mouthpiece for nebulizer machine, prescription provided today.  -     Respiratory Therapy Supplies (NEBULIZER/TUBING/MOUTHPIECE) KIT; Use as directed with Nebulizer medications  PAF (paroxysmal atrial fibrillation) ([place]) New diagnosis. Has follow up with cardiology coming up. Tolerating medications well with not abnormal bleeding or bruising. No palpitations or chest pain.     Continue all other maintenance medications.  Follow up plan: Return if symptoms worsen or fail to improve.   Continue healthy lifestyle choices, including diet (rich in fruits, vegetables, and lean proteins, and low in salt and simple carbohydrates) and exercise (at least 30 minutes of moderate physical activity daily).   The above assessment and management plan was discussed with the patient. The patient verbalized understanding of and has agreed to the management plan. Patient is aware to call the clinic if they develop any new symptoms or if symptoms persist or worsen. Patient is aware when to return to the clinic for a follow-up visit. Patient educated on when it is appropriate to go to the emergency department.   Monia Pouch, FNP-C [facility] [phone]

## 2021-03-29 ENCOUNTER — Telehealth: Payer: Self-pay | Admitting: Family Medicine

## 2021-03-29 NOTE — Telephone Encounter (Signed)
Pt wants a call when done.  

## 2021-04-02 ENCOUNTER — Telehealth: Payer: Self-pay | Admitting: Family Medicine

## 2021-04-03 ENCOUNTER — Other Ambulatory Visit: Payer: Self-pay | Admitting: Family Medicine

## 2021-04-03 DIAGNOSIS — J40 Bronchitis, not specified as acute or chronic: Secondary | ICD-10-CM

## 2021-04-03 DIAGNOSIS — C50911 Malignant neoplasm of unspecified site of right female breast: Secondary | ICD-10-CM | POA: Diagnosis not present

## 2021-04-03 NOTE — Telephone Encounter (Signed)
Pt needs the tubing that goes on the nebulizar. Pt wants a call when done

## 2021-04-04 NOTE — Telephone Encounter (Signed)
Orders faxed to [facility]. LMOVM to pt this has been done & that it was for walker & neb tubing from what Sharyn Lull had in her notes

## 2021-04-04 NOTE — Telephone Encounter (Signed)
Completing in another encounter

## 2021-04-09 NOTE — Progress Notes (Signed)
Cardiology Office Note  Date: 04/10/2021   ID: Krystal [name], DOB [birth year], MRN [id number]  PCP:  Dettinger, Fransisca Kaufmann, MD  Cardiologist:  Minus Breeding, MD Electrophysiologist:  None   Chief Complaint: Hospital follow-up 2 weeks  History of Present Illness: Krystal [name] is a 80 y.o. female with a history of chest pain, DOE, CAD status post coronary CT 09/27/2018, SVT, breast cancer status postmastectomy, HLD.  Patient called the office on 03/20/2021 reporting chest pain.  She was advised to go to [facility], ED.  She reported chest tightness along with DOE for the past year.  Stated chest pain could occur with activity.  In the emergency room troponin was negative, BNP 172, COVID-negative, D-dimer 0.95.  CTA no PE.  Had areas of groundglass opacity in left lung with follow-up CT recommended in 3 to 6 months.  Also noted large hiatal hernia.  EKG no acute ST changes.  Due to prior coronary CT showing moderate plaque in RCA and FFR significant distal LAD she was set up for cardiac catheterization.  She started on IV Lasix for mild acute CHF exacerbation.  She was transferred to [facility].  Cardiac catheterization demonstrated 40 to 50% ostial left main disease not felt to be hemodynamically significant and 20% RCA disease, mild aortic stenosis.  At time of cardiac catheterization she was diagnosed with new onset atrial flutter.  Echocardiogram demonstrated normal LV function, small pericardial effusion, mild MR, mild to moderate tricuspid regurgitation, moderate aortic insufficiency, mild aortic stenosis with mean gradient of 10 mmHg.  Plan was to follow valvular disease with serial echocardiograms.  Plan to have noncontrast CT in 3 to 6 months to evaluate groundglass opacity in left lung.  Given new onset atrial flutter/atrial fibrillation plan was for Eliquis 5 mg p.o. twice daily.  No aspirin with Eliquis.  Continue diltiazem 300 mg daily and Crestor 20 mg daily.  She is here for  follow-up status post recent cardiac catheterization and new onset atrial flutter/fibrillation.  She denies any ill effects from a cardiac catheterization.  She states she does not remember very much about the cardiac catheterization.  Denies any issues with her right radial access site.  She states she does feel the atrial fibrillation and can be aggravating at times.  She states she is taking her medications as directed including diltiazem 300 mg daily, Eliquis 5 mg p.o. twice daily.  She states she has not skipped any Eliquis doses.  Heart rate today is 84 and irregularly irregular.  She denies any anginal or exertional symptoms, orthostatic symptoms, CVA or TIA-like symptoms, PND, orthopnea.  Denies any bleeding issues.  Denies any DOE or SOB.  Denies any claudication-like symptoms, DVT or PE-like symptoms, or lower extremity edema.  We discussed DC cardioversion versus rate control strategy.  Patient states would just like to get this resolved and is agreeable to cardioversion.  Past Medical History:  Diagnosis Date   Anemia    Asthma    Brain tumor ([place])    1986   Breast cancer ([place])    Right breast   Cataract    Colon polyps    COPD (chronic obstructive pulmonary disease) ([place])    Hiatal hernia    Hyperlipidemia    Leg edema    Macular degeneration    Osteoporosis    Pneumonia    PVD (posterior vitreous detachment)    Right knee pain    Shingles rash     Past Surgical History:  Procedure Laterality Date   CATARACT EXTRACTION, BILATERAL     COLONOSCOPY     CORONARY PRESSURE WIRE/FFR WITH 3D MAPPING N/A 03/21/2021   Procedure: Coronary Pressure Wire/FFR w/3D Mapping;  Surgeon: Nelva Bush, MD;  Location: [facility];  Service: Cardiovascular;  Laterality: N/A;   CRANIECTOMY / CRANIOTOMY FOR EXCISION OF BRAIN TUMOR     Left side brain   LEFT HEART CATH AND CORONARY ANGIOGRAPHY N/A 03/21/2021   Procedure: LEFT HEART CATH AND CORONARY ANGIOGRAPHY;  Surgeon: Nelva Bush, MD;  Location: [facility];  Service: Cardiovascular;  Laterality: N/A;   MASTECTOMY Right    PARTIAL HYSTERECTOMY     POLYPECTOMY     SKIN CANCER EXCISION Left    Under left eye    Current Outpatient Medications  Medication Sig Dispense Refill   albuterol (VENTOLIN HFA) 108 (90 Base) MCG/ACT inhaler INHALE TWO PUFFS BY MOUTH 4 TIMES DAILY AS NEEDED (Patient taking differently: Inhale 2 puffs into the lungs every 4 (four) hours as needed for wheezing or shortness of breath.) 18 each 2   apixaban (ELIQUIS) 5 MG TABS tablet Take 1 tablet (5 mg total) by mouth 2 (two) times daily. 180 tablet 3   Calcium Citrate-Vitamin D (CALCIUM CITRATE + D) 315-250 MG-UNIT TABS Take 2 tablets by mouth daily. 120 tablet    cetirizine (ZYRTEC) 10 MG tablet Take 5-10 mg by mouth at bedtime.      Cholecalciferol (VITAMIN D) 2000 UNITS CAPS Take 1 capsule by mouth at bedtime.     diclofenac Sodium (VOLTAREN) 1 % GEL Apply 2 g topically 4 (four) times daily. 350 g 3   diltiazem (CARDIZEM CD) 300 MG 24 hr capsule Take 1 capsule (300 mg total) by mouth daily. 90 capsule 3   esomeprazole (NEXIUM) 40 MG capsule TAKE 1 CAPSULE BY MOUTH EVERY DAY 90 capsule 3   ferrous sulfate 325 (65 FE) MG tablet Take 1 tablet (325 mg total) by mouth at bedtime. 90 tablet 3   ipratropium-albuterol (DUONEB) 0.5-2.5 (3) MG/3ML SOLN Take 3 mLs by nebulization every 6 (six) hours as needed. Dx:  Asthma 493.90 360 mL 0   metoprolol tartrate (LOPRESSOR) 25 MG tablet TAKE 1 TABLET (25 MG TOTAL) BY MOUTH DAILY AS NEEDED. 90 tablet 3   montelukast (SINGULAIR) 10 MG tablet One tablet by mouth daily at bedtime 90 tablet 3   Multiple Vitamins-Minerals (PRESERVISION/LUTEIN PO) Take 1 capsule by mouth at bedtime.      nitroGLYCERIN (NITROLINGUAL) 0.4 MG/SPRAY spray ONE SPRAY UNDER TONGUE AS NEED FOR CHESTPAIN. MAY REPEAT IN 5 MINUTES IF PAIN NOT RELIEVED CALL 911. 12 g 3   Polyethyl Glycol-Propyl Glycol (SYSTANE) 0.4-0.3 % SOLN  Place 1 drop into both eyes 2 (two) times daily.     Respiratory Therapy Supplies (NEBULIZER/TUBING/MOUTHPIECE) KIT Use as directed with Nebulizer medications 1 kit 1   RESTASIS 0.05 % ophthalmic emulsion Place 1 drop into both eyes 2 (two) times daily.  4   rosuvastatin (CRESTOR) 20 MG tablet Take 1 tablet (20 mg total) by mouth daily. 90 tablet 1   No current facility-administered medications for this visit.   Allergies:  Actonel [risedronate], Fosamax [alendronate sodium], Mevacor [lovastatin], Miacalcin [calcitonin (salmon)], and Mobic [meloxicam]   Social History: The patient  reports that she quit smoking about 60 years ago. Her smoking use included cigarettes. She has never used smokeless tobacco. She reports that she does not drink alcohol and does not use drugs.   Family  History: The patient's family history includes Cancer in her father; Congestive Heart Failure in her mother; Dementia in her mother; Diabetes in her brother, brother, brother, brother, sister, sister, sister, sister, and son; Heart attack in her father; Heart disease in her brother, brother, sister, and sister; Heart disease (age of onset: 78) in her mother; Hyperlipidemia in her brother, brother, and brother; Hypertension in her sister, sister, sister, and sister; Osteoporosis in her mother and sister; Stroke in her mother.   ROS:  Please see the history of present illness. Otherwise, complete review of systems is positive for none.  All other systems are reviewed and negative.   Physical Exam: VS:  BP 128/72   Pulse 84   Ht _0  (1.6 m)   Wt 176 lb 9.6 oz (80.1 kg)   SpO2 97%   BMI 31.28 kg/m , BMI Body mass index is 31.28 kg/m.  Wt Readings from Last 3 Encounters:  04/10/21 176 lb 9.6 oz (80.1 kg)  03/28/21 186 lb (84.4 kg)  03/22/21 184 lb 15.5 oz (83.9 kg)    General: Patient appears comfortable at rest. Neck: Supple, no elevated JVP or carotid bruits, no thyromegaly. Lungs: Clear to auscultation,  nonlabored breathing at rest. Cardiac: Regular rate and rhythm, no S3 or significant systolic murmur, no pericardial rub. Extremities: No pitting edema, distal pulses 2+. Skin: Warm and dry.  Right radial access site clean and dry with good pulse Musculoskeletal: No kyphosis. Neuropsychiatric: Alert and oriented x3, affect grossly appropriate.  ECG:  EKG March 22, 2021 atrial fibrillation with a rate of 83.  Cannot rule out anterior infarct, age undetermined  Recent Labwork: 02/06/2021: ALT 19; AST 22 03/20/2021: B Natriuretic Peptide 172.0 03/22/2021: BUN 11; Creatinine, Ser 0.82; Hemoglobin 12.4; Platelets 159; Potassium 3.8; Sodium 137; TSH 3.542     Component Value Date/Time   CHOL 134 03/22/2021 0148   CHOL 226 (H) 11/05/2020 1416   CHOL 152 03/04/2013 1212   TRIG 83 03/22/2021 0148   TRIG 93 04/08/2016 1158   TRIG 97 03/04/2013 1212   HDL 55 03/22/2021 0148   HDL 57 11/05/2020 1416   HDL 68 04/08/2016 1158   HDL 62 03/04/2013 1212   CHOLHDL 2.4 03/22/2021 0148   VLDL 17 03/22/2021 0148   LDLCALC 62 03/22/2021 0148   LDLCALC 134 (H) 11/05/2020 1416   LDLCALC 75 01/27/2014 1147   LDLCALC 71 03/04/2013 1212    Other Studies Reviewed Today:  Left Cardiac cath 03/21/21 Coronary Pressure Wire/FFR w/3D Mapping  LEFT HEART CATH AND CORONARY ANGIOGRAPHY    Conclusion   Conclusions: Mild to moderate, non-obstructive coronary artery disease with 40-50% ostial LMCA stenosis that is not hemodynamically significant (RFR = 0.98) and 20% mid RCA disease. Mildly elevated left ventricular filling pressure. Mild aortic valve stenosis. Paroxysmal atrial fibrillation/flutter (appears to be a new diagnosis).   Recommendations: Medical therapy and risk factor modification to prevent progression of coronary artery disease. Initiate heparin infusion 2 hours after TR band removal.  Anticipate transition to DOAC tomorrow morning if no bleeding/vascular injury post-catheterization. Monitor  overnight, given that progressive dyspnea and chest tightness may be due to atrial fibrillation observed during catheterization and may require further evaluation/management.   Nelva Bush, MD [facility]   Diagnostic Dominance: Right      Echo  03/21/21 1. Left ventricular ejection fraction, by estimation, is 65 to 70%. The  left ventricle has normal function. The left ventricle has no regional  wall motion abnormalities. Left ventricular  diastolic parameters are  indeterminate.   2. Right ventricular systolic function is normal. The right ventricular  size is normal. There is normal pulmonary artery systolic pressure. The  estimated right ventricular systolic pressure is 64.4 mmHg.   3. A small pericardial effusion is present. The pericardial effusion is  posterior to the left ventricle.   4. The mitral valve is grossly normal. Mild mitral valve regurgitation.  Moderate mitral annular calcification.   5. Tricuspid valve regurgitation is mild to moderate.   6. The aortic valve is tricuspid. There is moderate calcification of the  aortic valve. Aortic valve regurgitation is moderate. Mild aortic valve  stenosis. Aortic regurgitation PHT measures 330 msec. Aortic valve mean  gradient measures 9.5 mmHg. Aortic  valve Vmax measures 2.14 m/s. Dimentionless index 0.50.   7. The inferior vena cava is normal in size with greater than 50%  respiratory variability, suggesting right atrial pressure of 3 mmHg.   Comparison(s): No significant change from prior study.  _____________    Assessment and Plan:  1. CAD in native artery   2. Paroxysmal atrial fibrillation (HCC)   3. Aortic valve stenosis, etiology of cardiac valve disease unspecified   4. Abnormal chest CT   5. Mixed hyperlipidemia   6. History of supraventricular tachycardia    1. CAD in native artery Recent cardiac catheterization for chest pain.Cardiac catheterization revealed mild to moderate nonobstructive CAD  with 40 to 50% ostial LMCA stenosis not hemodynamically significant and mild 20% mid RCA disease.  Mildly elevated LVEDP.  Mild aortic stenosis, PA AF/flutter.  Currently denies any anginal or exertional symptoms.  Continue sublingual nitroglycerin spray.  2. Paroxysmal atrial fibrillation ([place]) Recent new onset atrial fibrillation/flutter.  She was placed on Cardizem 300 mg p.o. daily and apixaban 5 mg p.o. daily.  She states she does not like to be in atrial fibrillation and can feel the palpitations which can be bothersome.  She wishes definitive treatment.  She has been on apixaban since March 22, 2021.  She states she has been taking the medication without interruption.  September 9 will be 3 weeks of uninterrupted anticoagulation.  She wishes definitive treatment in the form of DCCV.  I advised her I would send a note to her primary cardiologist to determine when DCCV could be performed.  3. Aortic valve stenosis, etiology of cardiac valve disease unspecified Recent echocardiogram 03/21/2021 EF 65 to 70%.  No WMA's.  Indeterminate diastolic parameters, small pericardial effusion posterior to the left ventricle.  Mild mitral regurgitation moderate mitral annular calcification.  Mild to moderate TR, aortic valve regurgitation moderate, mild aortic valve stenosis, aortic regurgitation PHT 330 ms.  Aortic valve mean gradient 9.5 mmHg.  Aortic valve V-max 4 m/s, dimensionless index 0.5  4. Abnormal chest CT Recent CT showed no pulmonary embolus but faint areas of groundglass opacity in left lung nonspecific which might be infectious or inflammatory including COVID-19.  Recommendation was follow-up with noncontrast CT at 3 to 6 months.  If nodules persistent subsequent management will be based on most suspicious nodules.  She had a month small right pleural effusion.  Subpleural consolidation in the periphery of right upper lobe slightly linear and bandlike appearance may be scarring.  Given right  mastectomy findings may be related to postradiation change.  Large hiatal hernia with majority of the stomach being intrathoracic.  Aortic atherosclerosis.  5. Mixed hyperlipidemia Continue Crestor 20 mg daily.  Recent lipid panel 03/22/2021 TC 134, TG 83, HDL 55, LDL 62.  6. History of supraventricular tachycardia No current SVT.  Currently in atrial fibrillation.  Continue metoprolol 25 mg as needed.  Medication Adjustments/Labs and Tests Ordered: Current medicines are reviewed at length with the patient today.  Concerns regarding medicines are outlined above.   Disposition: Follow-up with Dr. Percival Spanish at previously scheduled appointment  Signed, Levell July, NP 04/10/2021 10:19 AM    [facility] [address], [place], [place] [address] Phone: [phone]; Fax: [phone]

## 2021-04-10 ENCOUNTER — Encounter: Payer: Self-pay | Admitting: Family Medicine

## 2021-04-10 ENCOUNTER — Ambulatory Visit (INDEPENDENT_AMBULATORY_CARE_PROVIDER_SITE_OTHER): Payer: Medicare HMO | Admitting: Family Medicine

## 2021-04-10 VITALS — BP 128/72 | HR 84 | Ht 63.0 in | Wt 176.6 lb

## 2021-04-10 DIAGNOSIS — I480 Paroxysmal atrial fibrillation: Secondary | ICD-10-CM | POA: Diagnosis not present

## 2021-04-10 DIAGNOSIS — I2510 Atherosclerotic heart disease of native coronary artery without angina pectoris: Secondary | ICD-10-CM

## 2021-04-10 DIAGNOSIS — Z8679 Personal history of other diseases of the circulatory system: Secondary | ICD-10-CM

## 2021-04-10 DIAGNOSIS — R9389 Abnormal findings on diagnostic imaging of other specified body structures: Secondary | ICD-10-CM

## 2021-04-10 DIAGNOSIS — E782 Mixed hyperlipidemia: Secondary | ICD-10-CM | POA: Diagnosis not present

## 2021-04-10 DIAGNOSIS — I350 Nonrheumatic aortic (valve) stenosis: Secondary | ICD-10-CM

## 2021-04-10 NOTE — Patient Instructions (Addendum)
Medication Instructions:  Continue all current medications.  Labwork: none  Testing/Procedures: none  Follow-Up: Keep [facility] visit as scheduled   Any Other Special Instructions Will Be Listed Below (If Applicable).   If you need a refill on your cardiac medications before your next appointment, please call your pharmacy.

## 2021-04-11 DIAGNOSIS — R5383 Other fatigue: Secondary | ICD-10-CM | POA: Diagnosis not present

## 2021-04-11 DIAGNOSIS — H3530 Unspecified macular degeneration: Secondary | ICD-10-CM | POA: Diagnosis not present

## 2021-04-11 DIAGNOSIS — H548 Legal blindness, as defined in USA: Secondary | ICD-10-CM | POA: Diagnosis not present

## 2021-04-11 DIAGNOSIS — J42 Unspecified chronic bronchitis: Secondary | ICD-10-CM | POA: Diagnosis not present

## 2021-04-12 ENCOUNTER — Other Ambulatory Visit: Payer: Self-pay | Admitting: Family Medicine

## 2021-04-12 DIAGNOSIS — J029 Acute pharyngitis, unspecified: Secondary | ICD-10-CM | POA: Diagnosis not present

## 2021-04-12 DIAGNOSIS — R059 Cough, unspecified: Secondary | ICD-10-CM | POA: Diagnosis not present

## 2021-04-12 DIAGNOSIS — J441 Chronic obstructive pulmonary disease with (acute) exacerbation: Secondary | ICD-10-CM | POA: Diagnosis not present

## 2021-04-17 ENCOUNTER — Emergency Department (HOSPITAL_COMMUNITY): Payer: Medicare HMO

## 2021-04-17 ENCOUNTER — Encounter (HOSPITAL_COMMUNITY): Payer: Self-pay

## 2021-04-17 ENCOUNTER — Emergency Department (HOSPITAL_COMMUNITY)
Admission: EM | Admit: 2021-04-17 | Discharge: 2021-04-17 | Disposition: A | Discharge status: Home / Self Care | Payer: Medicare HMO | Attending: Emergency Medicine | Admitting: Emergency Medicine

## 2021-04-17 ENCOUNTER — Other Ambulatory Visit: Payer: Self-pay

## 2021-04-17 DIAGNOSIS — R0789 Other chest pain: Secondary | ICD-10-CM | POA: Diagnosis not present

## 2021-04-17 DIAGNOSIS — Z20822 Contact with and (suspected) exposure to covid-19: Secondary | ICD-10-CM | POA: Diagnosis not present

## 2021-04-17 DIAGNOSIS — J449 Chronic obstructive pulmonary disease, unspecified: Secondary | ICD-10-CM | POA: Diagnosis not present

## 2021-04-17 DIAGNOSIS — Z853 Personal history of malignant neoplasm of breast: Secondary | ICD-10-CM | POA: Insufficient documentation

## 2021-04-17 DIAGNOSIS — J069 Acute upper respiratory infection, unspecified: Secondary | ICD-10-CM

## 2021-04-17 DIAGNOSIS — R0602 Shortness of breath: Secondary | ICD-10-CM | POA: Diagnosis not present

## 2021-04-17 DIAGNOSIS — Z87891 Personal history of nicotine dependence: Secondary | ICD-10-CM | POA: Insufficient documentation

## 2021-04-17 DIAGNOSIS — I2510 Atherosclerotic heart disease of native coronary artery without angina pectoris: Secondary | ICD-10-CM | POA: Diagnosis not present

## 2021-04-17 DIAGNOSIS — J45909 Unspecified asthma, uncomplicated: Secondary | ICD-10-CM | POA: Insufficient documentation

## 2021-04-17 DIAGNOSIS — Z7901 Long term (current) use of anticoagulants: Secondary | ICD-10-CM | POA: Insufficient documentation

## 2021-04-17 DIAGNOSIS — R059 Cough, unspecified: Secondary | ICD-10-CM | POA: Diagnosis not present

## 2021-04-17 LAB — CBC WITH DIFFERENTIAL/PLATELET
Abs Immature Granulocytes: 0.01 10*3/uL (ref 0.00–0.07)
Basophils Absolute: 0 10*3/uL (ref 0.0–0.1)
Basophils Relative: 1 %
Eosinophils Absolute: 0.4 10*3/uL (ref 0.0–0.5)
Eosinophils Relative: 7 %
HCT: 43 % (ref 36.0–46.0)
Hemoglobin: 13.4 g/dL (ref 12.0–15.0)
Immature Granulocytes: 0 %
Lymphocytes Relative: 30 %
Lymphs Abs: 1.7 10*3/uL (ref 0.7–4.0)
MCH: 29.7 pg (ref 26.0–34.0)
MCHC: 31.2 g/dL (ref 30.0–36.0)
MCV: 95.3 fL (ref 80.0–100.0)
Monocytes Absolute: 0.4 10*3/uL (ref 0.1–1.0)
Monocytes Relative: 7 %
Neutro Abs: 3.2 10*3/uL (ref 1.7–7.7)
Neutrophils Relative %: 55 %
Platelets: 143 10*3/uL — ABNORMAL LOW (ref 150–400)
RBC: 4.51 MIL/uL (ref 3.87–5.11)
RDW: 13.1 % (ref 11.5–15.5)
WBC: 5.8 10*3/uL (ref 4.0–10.5)
nRBC: 0 % (ref 0.0–0.2)

## 2021-04-17 LAB — TROPONIN I (HIGH SENSITIVITY)
Troponin I (High Sensitivity): 13 ng/L (ref <18)
Troponin I (High Sensitivity): 13 ng/L (ref <18)

## 2021-04-17 LAB — BASIC METABOLIC PANEL
Anion gap: 7 (ref 5–15)
BUN: 18 mg/dL (ref 8–23)
CO2: 28 mmol/L (ref 22–32)
Calcium: 8.8 mg/dL — ABNORMAL LOW (ref 8.9–10.3)
Chloride: 105 mmol/L (ref 98–111)
Creatinine, Ser: 0.67 mg/dL (ref 0.44–1.00)
GFR, Estimated: >60 mL/min (ref >60)
Glucose, Bld: 87 mg/dL (ref 70–99)
Potassium: 3.7 mmol/L (ref 3.5–5.1)
Sodium: 140 mmol/L (ref 135–145)

## 2021-04-17 LAB — MAGNESIUM: Magnesium: 2.1 mg/dL (ref 1.7–2.4)

## 2021-04-17 LAB — RESP PANEL BY RT-PCR (FLU A&B, COVID) ARPGX2
Influenza A by PCR: NEGATIVE
Influenza B by PCR: NEGATIVE
SARS Coronavirus 2 by RT PCR: NEGATIVE

## 2021-04-17 LAB — BRAIN NATRIURETIC PEPTIDE: B Natriuretic Peptide: 147 pg/mL — ABNORMAL HIGH (ref 0.0–100.0)

## 2021-04-17 MED ORDER — LIDOCAINE HCL (PF) 1 % IJ SOLN
30.0000 mL | Freq: Once | INTRAMUSCULAR | Status: AC
  Administered 2021-04-17: 30 mL
  Filled 2021-04-17: qty 30

## 2021-04-17 MED ORDER — CEFTRIAXONE SODIUM 1 G IJ SOLR
1.0000 g | Freq: Once | INTRAMUSCULAR | Status: AC
  Administered 2021-04-17: 1 g via INTRAMUSCULAR
  Filled 2021-04-17: qty 10

## 2021-04-17 MED ORDER — DOXYCYCLINE HYCLATE 100 MG PO CAPS: 100.0000 mg | ORAL_CAPSULE | Freq: Two times a day (BID) | ORAL | 0 refills | Status: DC

## 2021-04-17 NOTE — Discharge Instructions (Addendum)
Call your primary care doctor or specialist as discussed in the next 2-3 days.   Return immediately back to the ER if:  Your symptoms worsen within the next 12-24 hours. You develop new symptoms such as new fevers, persistent vomiting, new pain, shortness of breath, or new weakness or numbness, or if you have any other concerns.  

## 2021-04-17 NOTE — ED Triage Notes (Signed)
Pt to er, pt states that she is here for some congestion in her chest, pt states that she has had the congestions for a little over a week, pt states that she has a non productive.

## 2021-04-17 NOTE — ED Provider Notes (Signed)
[facility] Provider Note   CSN: [id number] Arrival date & time: 04/17/21  1309     History Chief Complaint  Patient presents with   Cough   Sore Throat    Krystal [name] is a 80 y.o. female.  Patient presents to ER chief complaint of a persistent cough, shortness of breath and chest tightness with cough.  She states that the chest tightness does not when she has a coughing fit.  Currently denies chest pain.  She has a history of recent cardiac catheterization.  She seen her primary care doctor regarding this cough and states that she finished a course of antibiotics without improvement.  Denies fevers at home.  Denies vomiting or diarrhea.      Past Medical History:  Diagnosis Date   Anemia    Asthma    Brain tumor ([place])    1986   Breast cancer ([place])    Right breast   Cataract    Colon polyps    COPD (chronic obstructive pulmonary disease) ([place])    Hiatal hernia    Hyperlipidemia    Leg edema    Macular degeneration    Osteoporosis    Pneumonia    PVD (posterior vitreous detachment)    Right knee pain    Shingles rash     Patient Active Problem List   Diagnosis Date Noted   Gait instability 03/28/2021   Chronic right SI joint pain 03/28/2021   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   At risk for falling 03/28/2021   PAF (paroxysmal atrial fibrillation) ([place]) 03/28/2021   Unstable angina ([facility])    Chest pain 03/20/2021   CAD (coronary artery disease) 03/20/2021   SOB (shortness of breath) 05/17/2019   Osteoporosis 03/24/2019   Chest pain of uncertain etiology 97/41/6384   Carpal tunnel syndrome of right wrist 03/25/2018   Influenza with pneumonia 10/30/2015   Abdominal aortic atherosclerosis ([place]) 04/10/2015   PSVT (paroxysmal supraventricular tachycardia) ([place]) 05/10/2014   Macular degeneration, age related, nonexudative 08/24/2013   History of right mastectomy 07/13/2013   Anemia, iron deficiency 06/21/2013   Allergic rhinitis 06/21/2013    Chronic low back pain 06/21/2013   Malignant neoplasm of female breast ([place]) 11/22/2007   Mixed hyperlipidemia 11/22/2007   Anxiety state 11/22/2007   Depression, recurrent ([place]) 11/22/2007   Asthma 11/22/2007   GERD 11/22/2007   DUODENITIS, WITHOUT HEMORRHAGE 08/14/2007   Diaphragmatic hernia 08/14/2007   Hiatal hernia 08/14/2007   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    Past Surgical History:  Procedure Laterality Date   CATARACT EXTRACTION, BILATERAL     COLONOSCOPY     CORONARY PRESSURE WIRE/FFR WITH 3D MAPPING N/A 03/21/2021   Procedure: Coronary Pressure Wire/FFR w/3D Mapping;  Surgeon: Nelva Bush, MD;  Location: [facility];  Service: Cardiovascular;  Laterality: N/A;   CRANIECTOMY / CRANIOTOMY FOR EXCISION OF BRAIN TUMOR     Left side brain   LEFT HEART CATH AND CORONARY ANGIOGRAPHY N/A 03/21/2021   Procedure: LEFT HEART CATH AND CORONARY ANGIOGRAPHY;  Surgeon: Nelva Bush, MD;  Location: [facility];  Service: Cardiovascular;  Laterality: N/A;   MASTECTOMY Right    PARTIAL HYSTERECTOMY     POLYPECTOMY     SKIN CANCER EXCISION Left    Under left eye     OB History     Gravida      Para      Term      Preterm      AB  Living  1      SAB      IAB      Ectopic      Multiple      Live Births              Family History  Problem Relation Age of Onset   Heart disease Mother 54       Stroke    Dementia Mother    Congestive Heart Failure Mother    Osteoporosis Mother    Stroke Mother    Heart disease Sister    Hypertension Sister    Diabetes Sister    Osteoporosis Sister    Heart disease Brother    Hyperlipidemia Brother    Diabetes Brother    Heart disease Sister    Hypertension Sister    Diabetes Sister    Diabetes Sister    Hypertension Sister    Diabetes Sister    Hypertension Sister    Diabetes Brother    Hyperlipidemia Brother    Cancer Father        lung   Heart attack Father    Diabetes Brother     Heart disease Brother    Hyperlipidemia Brother    Diabetes Brother    Diabetes Son    Colon cancer Neg Hx    Rectal cancer Neg Hx    Stomach cancer Neg Hx    Esophageal cancer Neg Hx     Social History   Tobacco Use   Smoking status: Former    Types: Cigarettes    Quit date: 02/09/1961    Years since quitting: 60.2   Smokeless tobacco: Never  Vaping Use   Vaping Use: Never used  Substance Use Topics   Alcohol use: No    Alcohol/week: 0.0 standard drinks   Drug use: No    Home Medications Prior to Admission medications   Medication Sig Start Date End Date Taking? Authorizing Provider  albuterol (VENTOLIN HFA) 108 (90 Base) MCG/ACT inhaler INHALE TWO PUFFS BY MOUTH 4 TIMES DAILY AS NEEDED Patient taking differently: Inhale 2 puffs into the lungs every 4 (four) hours as needed for wheezing or shortness of breath. 03/06/21  Yes Dettinger, Fransisca Kaufmann, MD  apixaban (ELIQUIS) 5 MG TABS tablet Take 1 tablet (5 mg total) by mouth 2 (two) times daily. 03/22/21  Yes Furth, Cadence H, PA-C  Calcium Citrate-Vitamin D (CALCIUM CITRATE + D) 315-250 MG-UNIT TABS Take 2 tablets by mouth daily. 12/10/15  Yes Cherre Robins, PharmD  cetirizine (ZYRTEC) 10 MG tablet Take 5-10 mg by mouth at bedtime.    Yes [provider]  Cholecalciferol (VITAMIN D) 2000 UNITS CAPS Take 1 capsule by mouth at bedtime.   Yes [provider]  diclofenac Sodium (VOLTAREN) 1 % GEL Apply 2 g topically 4 (four) times daily. 11/05/20  Yes Dettinger, Fransisca Kaufmann, MD  diltiazem (CARDIZEM CD) 300 MG 24 hr capsule Take 1 capsule (300 mg total) by mouth daily. 11/05/20  Yes Dettinger, Fransisca Kaufmann, MD  doxycycline (VIBRAMYCIN) 100 MG capsule Take 1 capsule (100 mg total) by mouth 2 (two) times daily for 7 days. 04/17/21 04/24/21 Yes Luna Fuse, MD  esomeprazole (NEXIUM) 40 MG capsule TAKE 1 CAPSULE BY MOUTH EVERY DAY 11/05/20  Yes Dettinger, Fransisca Kaufmann, MD  ferrous sulfate 325 (65 FE) MG tablet Take 1 tablet (325 mg total) by  mouth at bedtime. 11/05/20  Yes Dettinger, Fransisca Kaufmann, MD  ipratropium-albuterol (DUONEB) 0.5-2.5 (3) MG/3ML SOLN TAKE 3  MLS BY NEBULIZATION EVERY 6 (SIX) HOURS AS NEEDED. DX: ASTHMA 493.90 04/15/21  Yes Dettinger, Fransisca Kaufmann, MD  metoprolol tartrate (LOPRESSOR) 25 MG tablet TAKE 1 TABLET (25 MG TOTAL) BY MOUTH DAILY AS NEEDED. 11/05/20  Yes Dettinger, Fransisca Kaufmann, MD  montelukast (SINGULAIR) 10 MG tablet One tablet by mouth daily at bedtime 11/05/20  Yes Dettinger, Fransisca Kaufmann, MD  Multiple Vitamins-Minerals (PRESERVISION/LUTEIN PO) Take 1 capsule by mouth at bedtime.    Yes [provider]  nitroGLYCERIN (NITROLINGUAL) 0.4 MG/SPRAY spray ONE SPRAY UNDER TONGUE AS NEED FOR CHESTPAIN. MAY REPEAT IN 5 MINUTES IF PAIN NOT RELIEVED CALL 911. 11/17/18  Yes Hochrein, Jeneen Rinks, MD  Polyethyl Glycol-Propyl Glycol (SYSTANE) 0.4-0.3 % SOLN Place 1 drop into both eyes 2 (two) times daily.   Yes [provider]  predniSONE (DELTASONE) 10 MG tablet Take 10 mg by mouth See admin instructions. 6,5,4,3,2,1 dose down 04/12/21  Yes [provider]  rosuvastatin (CRESTOR) 20 MG tablet Take 1 tablet (20 mg total) by mouth daily. 03/23/21  Yes Furth, Cadence H, PA-C  diltiazem (TIAZAC) 300 MG 24 hr capsule Take by mouth. Patient not taking: No sig reported 11/05/20   [provider]  Respiratory Therapy Supplies (NEBULIZER/TUBING/MOUTHPIECE) KIT Use as directed with Nebulizer medications 03/28/21   Rakes, Connye Burkitt, FNP  RESTASIS 0.05 % ophthalmic emulsion Place 1 drop into both eyes 2 (two) times daily. Patient not taking: Reported on 04/17/2021 03/05/18   [provider]    Allergies    Actonel [risedronate], Fosamax [alendronate sodium], Mevacor [lovastatin], Miacalcin [calcitonin (salmon)], and Mobic [meloxicam]  Review of Systems   Review of Systems  Constitutional:  Negative for fever.  HENT:  Negative for ear pain.   Eyes:  Negative for pain.  Respiratory:  Positive for cough.    Cardiovascular:  Negative for chest pain.  Gastrointestinal:  Negative for abdominal pain.  Genitourinary:  Negative for flank pain.  Musculoskeletal:  Negative for back pain.  Skin:  Negative for rash.  Neurological:  Negative for headaches.   Physical Exam Updated Vital Signs BP 127/62 (BP Location: Right Arm)   Pulse 77   Temp 97.6 F (36.4 C) (Oral)   Resp 19   Ht 5' 3"  (1.6 m)   Wt 79.8 kg   SpO2 95%   BMI 31.18 kg/m   Physical Exam Constitutional:      General: She is not in acute distress.    Appearance: Normal appearance.  HENT:     Head: Normocephalic.     Nose: Nose normal.  Eyes:     Extraocular Movements: Extraocular movements intact.  Cardiovascular:     Rate and Rhythm: Normal rate.  Pulmonary:     Effort: Pulmonary effort is normal.  Musculoskeletal:        General: Normal range of motion.     Cervical back: Normal range of motion.  Neurological:     General: No focal deficit present.     Mental Status: She is alert. Mental status is at baseline.    ED Results / Procedures / Treatments   Labs (all labs ordered are listed, but only abnormal results are displayed) Labs Reviewed  BASIC METABOLIC PANEL - Abnormal; Notable for the following components:      Result Value   Calcium 8.8 (*)    All other components within normal limits  BRAIN NATRIURETIC PEPTIDE - Abnormal; Notable for the following components:   B Natriuretic Peptide 147.0 (*)    All other  components within normal limits  CBC WITH DIFFERENTIAL/PLATELET - Abnormal; Notable for the following components:   Platelets 143 (*)    All other components within normal limits  RESP PANEL BY RT-PCR (FLU A&B, COVID) ARPGX2  MAGNESIUM  TROPONIN I (HIGH SENSITIVITY)  TROPONIN I (HIGH SENSITIVITY)    EKG EKG Interpretation  Date/Time:  Wednesday April 17 2021 16:38:20 EDT Ventricular Rate:  75 PR Interval:  224 QRS Duration: 82 QT Interval:  415 QTC Calculation: 464 R  Axis:   28 Text Interpretation: Sinus rhythm Prolonged PR interval Borderline low voltage, extremity leads Confirmed by Thamas Jaegers ([id number]) on 04/17/2021 4:44:10 PM  Radiology DG Chest 1 View  Result Date: 04/17/2021 CLINICAL DATA:  Congestion, cough EXAM: CHEST  1 VIEW COMPARISON:  Chest radiograph 03/20/2021 FINDINGS: The cardiomediastinal silhouette is stable. There are patchy opacities in the lateral left base. A background of increased interstitial markings is overall unchanged, likely chronic. Dense retrocardiac opacity is consistent with a hiatal hernia seen on prior CT. There is no other focal airspace disease. There is no significant pleural effusion. There is no pneumothorax. There is no acute osseous abnormality. IMPRESSION: Patchy opacities in the lateral left base may reflect infection in the correct clinical setting. Electronically Signed   By: Valetta Mole M.D.   On: 04/17/2021 15:12    Procedures Procedures   Medications Ordered in ED Medications  cefTRIAXone (ROCEPHIN) injection 1 g (1 g Intramuscular Given 04/17/21 1701)  lidocaine (PF) (XYLOCAINE) 1 % injection 30 mL (30 mLs Other Given 04/17/21 1702)    ED Course  I have reviewed the triage vital signs and the nursing notes.  Pertinent labs & imaging results that were available during my care of the patient were reviewed by me and considered in my medical decision making (see chart for details).    MDM Rules/Calculators/A&P                           Chest x-ray is equivocal with some patchy opacities in the left base unclear significance.  Vital signs within normal limits.  Labs unremarkable troponins are negative.  Given inconclusive chest x-ray, patient empirically given IV Rocephin.  Appears she finished amoxicillin recently.  We will give a prescription of doxycycline to go home with.  Advise continued monitoring by primary care physicians within the week.  Advising immediate return for worsening symptoms  difficulty breathing fevers or any additional concerns.  Final Clinical Impression(s) / ED Diagnoses Final diagnoses:  Upper respiratory tract infection, unspecified type    Rx / DC Orders ED Discharge Orders          Ordered    doxycycline (VIBRAMYCIN) 100 MG capsule  2 times daily        04/17/21 1753             Luna Fuse, MD 04/17/21 1753

## 2021-04-17 NOTE — ED Provider Notes (Signed)
Emergency Medicine Provider Triage Evaluation Note  Krystal [name] , a 80 y.o. female with past medical history of paroxysmal A. fib, COPD, CAD, and breast cancer was evaluated in triage.  Pt complains of cough, chest congestion, shortness of breath and orthopnea.  Symptoms have been present for 1-1/2 weeks.  She was seen at onset by her PCP and prescribed antibiotics which she states are not helping her symptoms.  She describes tightness across her chest that associated with exertion and coughing.  Denies fever, chills, recent COVID or known COVID exposure.  No abdominal pain.  She also endorses some increased edema of her lower legs.  She does not currently take any diuretics.  Review of Systems  Positive: Cough, chest pain, shortness of breath Negative: Fever, chills, abdominal pain diarrhea or vomiting  Physical Exam  BP (!) 122/57 (BP Location: Right Arm)   Pulse 74   Temp 97.6 F (36.4 C) (Oral)   Resp (!) 21   Ht '5\' 3"'$  (1.6 m)   Wt 79.8 kg   SpO2 96%   BMI 31.18 kg/m  Gen:   Awake, no distress   Resp:  Normal effort, no increased work of breathing MSK:   Moves extremities without difficulty 1+ edema the bilateral lower extremities Other:    Medical Decision Making  Medically screening exam initiated at 2:43 PM.  Appropriate orders placed.  ADELYN ARKELL was informed that the remainder of the evaluation will be completed by another provider, this initial triage assessment does not replace that evaluation, and the importance of remaining in the ED until their evaluation is complete.  80 year old female with history of paroxysmal A. fib anticoagulated with Eliquis here with tightness of her chest, exertional shortness of breath and orthopnea.  No history of CHF.  She will need further work-up in the emergency department she is agreeable to plan.   Kem Parkinson, PA-C 04/17/21 1449    Luna Fuse, MD 04/17/21 1747

## 2021-04-20 ENCOUNTER — Other Ambulatory Visit: Payer: Self-pay

## 2021-04-20 ENCOUNTER — Encounter (HOSPITAL_COMMUNITY): Payer: Self-pay

## 2021-04-20 ENCOUNTER — Emergency Department (HOSPITAL_COMMUNITY): Payer: Medicare HMO

## 2021-04-20 ENCOUNTER — Inpatient Hospital Stay (HOSPITAL_COMMUNITY)
Admission: EM | Admit: 2021-04-20 | Discharge: 2021-04-24 | DRG: 190 | Disposition: A | Discharge status: Home Health | Payer: Medicare HMO | Attending: Internal Medicine | Admitting: Internal Medicine

## 2021-04-20 DIAGNOSIS — Z9841 Cataract extraction status, right eye: Secondary | ICD-10-CM

## 2021-04-20 DIAGNOSIS — E785 Hyperlipidemia, unspecified: Secondary | ICD-10-CM | POA: Diagnosis present

## 2021-04-20 DIAGNOSIS — Z853 Personal history of malignant neoplasm of breast: Secondary | ICD-10-CM

## 2021-04-20 DIAGNOSIS — Z9011 Acquired absence of right breast and nipple: Secondary | ICD-10-CM

## 2021-04-20 DIAGNOSIS — Z888 Allergy status to other drugs, medicaments and biological substances status: Secondary | ICD-10-CM

## 2021-04-20 DIAGNOSIS — T380X5A Adverse effect of glucocorticoids and synthetic analogues, initial encounter: Secondary | ICD-10-CM | POA: Diagnosis present

## 2021-04-20 DIAGNOSIS — Z90711 Acquired absence of uterus with remaining cervical stump: Secondary | ICD-10-CM

## 2021-04-20 DIAGNOSIS — R0602 Shortness of breath: Secondary | ICD-10-CM | POA: Diagnosis not present

## 2021-04-20 DIAGNOSIS — R739 Hyperglycemia, unspecified: Secondary | ICD-10-CM | POA: Diagnosis present

## 2021-04-20 DIAGNOSIS — K219 Gastro-esophageal reflux disease without esophagitis: Secondary | ICD-10-CM | POA: Diagnosis present

## 2021-04-20 DIAGNOSIS — Z8262 Family history of osteoporosis: Secondary | ICD-10-CM

## 2021-04-20 DIAGNOSIS — Z7901 Long term (current) use of anticoagulants: Secondary | ICD-10-CM

## 2021-04-20 DIAGNOSIS — Z85828 Personal history of other malignant neoplasm of skin: Secondary | ICD-10-CM

## 2021-04-20 DIAGNOSIS — R0600 Dyspnea, unspecified: Secondary | ICD-10-CM | POA: Diagnosis not present

## 2021-04-20 DIAGNOSIS — R002 Palpitations: Secondary | ICD-10-CM | POA: Diagnosis present

## 2021-04-20 DIAGNOSIS — R069 Unspecified abnormalities of breathing: Secondary | ICD-10-CM | POA: Diagnosis not present

## 2021-04-20 DIAGNOSIS — M810 Age-related osteoporosis without current pathological fracture: Secondary | ICD-10-CM | POA: Diagnosis present

## 2021-04-20 DIAGNOSIS — Z9842 Cataract extraction status, left eye: Secondary | ICD-10-CM

## 2021-04-20 DIAGNOSIS — E875 Hyperkalemia: Secondary | ICD-10-CM | POA: Diagnosis not present

## 2021-04-20 DIAGNOSIS — J9601 Acute respiratory failure with hypoxia: Secondary | ICD-10-CM | POA: Diagnosis present

## 2021-04-20 DIAGNOSIS — Z87891 Personal history of nicotine dependence: Secondary | ICD-10-CM

## 2021-04-20 DIAGNOSIS — Z83438 Family history of other disorder of lipoprotein metabolism and other lipidemia: Secondary | ICD-10-CM

## 2021-04-20 DIAGNOSIS — J441 Chronic obstructive pulmonary disease with (acute) exacerbation: Secondary | ICD-10-CM | POA: Diagnosis not present

## 2021-04-20 DIAGNOSIS — Z743 Need for continuous supervision: Secondary | ICD-10-CM | POA: Diagnosis not present

## 2021-04-20 DIAGNOSIS — J449 Chronic obstructive pulmonary disease, unspecified: Secondary | ICD-10-CM | POA: Diagnosis present

## 2021-04-20 DIAGNOSIS — I480 Paroxysmal atrial fibrillation: Secondary | ICD-10-CM | POA: Diagnosis present

## 2021-04-20 DIAGNOSIS — H3530 Unspecified macular degeneration: Secondary | ICD-10-CM | POA: Diagnosis present

## 2021-04-20 DIAGNOSIS — I4891 Unspecified atrial fibrillation: Secondary | ICD-10-CM | POA: Diagnosis not present

## 2021-04-20 DIAGNOSIS — R062 Wheezing: Secondary | ICD-10-CM | POA: Diagnosis not present

## 2021-04-20 DIAGNOSIS — Z7952 Long term (current) use of systemic steroids: Secondary | ICD-10-CM

## 2021-04-20 DIAGNOSIS — Z86010 Personal history of colonic polyps: Secondary | ICD-10-CM

## 2021-04-20 DIAGNOSIS — R600 Localized edema: Secondary | ICD-10-CM | POA: Diagnosis present

## 2021-04-20 DIAGNOSIS — I2510 Atherosclerotic heart disease of native coronary artery without angina pectoris: Secondary | ICD-10-CM | POA: Diagnosis present

## 2021-04-20 DIAGNOSIS — I700 Atherosclerosis of aorta: Secondary | ICD-10-CM | POA: Diagnosis present

## 2021-04-20 DIAGNOSIS — E876 Hypokalemia: Secondary | ICD-10-CM | POA: Diagnosis present

## 2021-04-20 DIAGNOSIS — H548 Legal blindness, as defined in USA: Secondary | ICD-10-CM | POA: Diagnosis present

## 2021-04-20 DIAGNOSIS — I491 Atrial premature depolarization: Secondary | ICD-10-CM | POA: Diagnosis not present

## 2021-04-20 DIAGNOSIS — Z20822 Contact with and (suspected) exposure to covid-19: Secondary | ICD-10-CM | POA: Diagnosis present

## 2021-04-20 DIAGNOSIS — I352 Nonrheumatic aortic (valve) stenosis with insufficiency: Secondary | ICD-10-CM | POA: Diagnosis present

## 2021-04-20 DIAGNOSIS — Z79899 Other long term (current) drug therapy: Secondary | ICD-10-CM

## 2021-04-20 DIAGNOSIS — I482 Chronic atrial fibrillation, unspecified: Secondary | ICD-10-CM | POA: Diagnosis present

## 2021-04-20 LAB — CBC WITH DIFFERENTIAL/PLATELET
Abs Immature Granulocytes: 0.02 10*3/uL (ref 0.00–0.07)
Basophils Absolute: 0 10*3/uL (ref 0.0–0.1)
Basophils Relative: 0 %
Eosinophils Absolute: 0.6 10*3/uL — ABNORMAL HIGH (ref 0.0–0.5)
Eosinophils Relative: 6 %
HCT: 43.6 % (ref 36.0–46.0)
Hemoglobin: 13.6 g/dL (ref 12.0–15.0)
Immature Granulocytes: 0 %
Lymphocytes Relative: 48 %
Lymphs Abs: 4.3 10*3/uL — ABNORMAL HIGH (ref 0.7–4.0)
MCH: 29.6 pg (ref 26.0–34.0)
MCHC: 31.2 g/dL (ref 30.0–36.0)
MCV: 95 fL (ref 80.0–100.0)
Monocytes Absolute: 0.6 10*3/uL (ref 0.1–1.0)
Monocytes Relative: 6 %
Neutro Abs: 3.7 10*3/uL (ref 1.7–7.7)
Neutrophils Relative %: 40 %
Platelets: 160 10*3/uL (ref 150–400)
RBC: 4.59 MIL/uL (ref 3.87–5.11)
RDW: 13.2 % (ref 11.5–15.5)
WBC: 9.2 10*3/uL (ref 4.0–10.5)
nRBC: 0 % (ref 0.0–0.2)

## 2021-04-20 LAB — COMPREHENSIVE METABOLIC PANEL
ALT: 29 U/L (ref 0–44)
AST: 36 U/L (ref 15–41)
Albumin: 3.8 g/dL (ref 3.5–5.0)
Alkaline Phosphatase: 47 U/L (ref 38–126)
Anion gap: 7 (ref 5–15)
BUN: 11 mg/dL (ref 8–23)
CO2: 26 mmol/L (ref 22–32)
Calcium: 8.5 mg/dL — ABNORMAL LOW (ref 8.9–10.3)
Chloride: 104 mmol/L (ref 98–111)
Creatinine, Ser: 0.79 mg/dL (ref 0.44–1.00)
GFR, Estimated: >60 mL/min (ref >60)
Glucose, Bld: 176 mg/dL — ABNORMAL HIGH (ref 70–99)
Potassium: 3.3 mmol/L — ABNORMAL LOW (ref 3.5–5.1)
Sodium: 137 mmol/L (ref 135–145)
Total Bilirubin: 0.4 mg/dL (ref 0.3–1.2)
Total Protein: 6.6 g/dL (ref 6.5–8.1)

## 2021-04-20 LAB — BRAIN NATRIURETIC PEPTIDE: B Natriuretic Peptide: 137 pg/mL — ABNORMAL HIGH (ref 0.0–100.0)

## 2021-04-20 LAB — RESP PANEL BY RT-PCR (FLU A&B, COVID) ARPGX2
Influenza A by PCR: NEGATIVE
Influenza B by PCR: NEGATIVE
SARS Coronavirus 2 by RT PCR: NEGATIVE

## 2021-04-20 LAB — TROPONIN I (HIGH SENSITIVITY): Troponin I (High Sensitivity): 7 ng/L (ref <18)

## 2021-04-20 MED ORDER — IPRATROPIUM BROMIDE 0.02 % IN SOLN
RESPIRATORY_TRACT | Status: AC
  Administered 2021-04-20: 0.5 mg
  Filled 2021-04-20: qty 2.5

## 2021-04-20 MED ORDER — ALBUTEROL (5 MG/ML) CONTINUOUS INHALATION SOLN
INHALATION_SOLUTION | RESPIRATORY_TRACT | Status: AC
  Administered 2021-04-20: 10 mg
  Filled 2021-04-20: qty 20

## 2021-04-20 MED ORDER — IPRATROPIUM-ALBUTEROL 0.5-2.5 (3) MG/3ML IN SOLN
6.0000 mL | Freq: Once | RESPIRATORY_TRACT | Status: AC
  Administered 2021-04-20: 6 mL via RESPIRATORY_TRACT
  Filled 2021-04-20: qty 6

## 2021-04-20 NOTE — ED Triage Notes (Addendum)
Rcems for SOB/cough for over a month.  Non-productive cough. Wheezing and rhonchi. 1 albuterol and 1 duo neb. 125 solumedrol.  18g l hand Does not wear o2 at home. Was on 4l with ems.  On blood thinner for newly dx a fib. Was given diuretic when seen last, did not get script for it. Also c/o leg swelling.

## 2021-04-20 NOTE — ED Notes (Signed)
Started on CAT nebulizer , albuterol 10.0 mg / 0.'5mg'$  atrovent

## 2021-04-20 NOTE — ED Provider Notes (Signed)
[facility] EMERGENCY DEPARTMENT Provider Note   CSN: [id number] Arrival date & time: 04/20/21  2155     History Chief Complaint  Patient presents with   Shortness of Breath    Krystal [name] is a 80 y.o. female with PMH atrial fibrillation, COPD who presents the emergency department for evaluation of shortness of breath and wheezing.  She states that she has had a shortness of breath and cough for over a month but acutely worsened today.  EMS administered 1 albuterol, 1 DuoNeb, 125 mg Solu-Medrol and arrives on 4 L nasal cannula.  Denies chest pain, abdominal pain, nausea, vomiting, fever or other systemic symptoms.   Shortness of Breath Associated symptoms: no abdominal pain, no chest pain, no cough, no ear pain, no fever, no rash, no sore throat and no vomiting       Past Medical History:  Diagnosis Date   Anemia    Asthma    Brain tumor ([place])    1986   Breast cancer ([place])    Right breast   Cataract    Colon polyps    COPD (chronic obstructive pulmonary disease) ([place])    Hiatal hernia    Hyperlipidemia    Leg edema    Macular degeneration    Osteoporosis    Pneumonia    PVD (posterior vitreous detachment)    Right knee pain    Shingles rash     Patient Active Problem List   Diagnosis Date Noted   Gait instability 03/28/2021   Chronic right SI joint pain 03/28/2021   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   At risk for falling 03/28/2021   PAF (paroxysmal atrial fibrillation) ([place]) 03/28/2021   Unstable angina (HCC)    Chest pain 03/20/2021   CAD (coronary artery disease) 03/20/2021   SOB (shortness of breath) 05/17/2019   Osteoporosis 03/24/2019   Chest pain of uncertain etiology 63/33/5456   Carpal tunnel syndrome of right wrist 03/25/2018   Influenza with pneumonia 10/30/2015   Abdominal aortic atherosclerosis ([place]) 04/10/2015   PSVT (paroxysmal supraventricular tachycardia) ([place]) 05/10/2014   Macular degeneration, age related, nonexudative 08/24/2013    History of right mastectomy 07/13/2013   Anemia, iron deficiency 06/21/2013   Allergic rhinitis 06/21/2013   Chronic low back pain 06/21/2013   Malignant neoplasm of female breast ([place]) 11/22/2007   Mixed hyperlipidemia 11/22/2007   Anxiety state 11/22/2007   Depression, recurrent ([place]) 11/22/2007   Asthma 11/22/2007   GERD 11/22/2007   DUODENITIS, WITHOUT HEMORRHAGE 08/14/2007   Diaphragmatic hernia 08/14/2007   Hiatal hernia 08/14/2007   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    Past Surgical History:  Procedure Laterality Date   CATARACT EXTRACTION, BILATERAL     COLONOSCOPY     CORONARY PRESSURE WIRE/FFR WITH 3D MAPPING N/A 03/21/2021   Procedure: Coronary Pressure Wire/FFR w/3D Mapping;  Surgeon: Nelva Bush, MD;  Location: [place] [facility];  Service: Cardiovascular;  Laterality: N/A;   CRANIECTOMY / CRANIOTOMY FOR EXCISION OF BRAIN TUMOR     Left side brain   LEFT HEART CATH AND CORONARY ANGIOGRAPHY N/A 03/21/2021   Procedure: LEFT HEART CATH AND CORONARY ANGIOGRAPHY;  Surgeon: Nelva Bush, MD;  Location: [place] [facility];  Service: Cardiovascular;  Laterality: N/A;   MASTECTOMY Right    PARTIAL HYSTERECTOMY     POLYPECTOMY     SKIN CANCER EXCISION Left    Under left eye     OB History     Gravida      Para  Term      Preterm      AB      Living  1      SAB      IAB      Ectopic      Multiple      Live Births              Family History  Problem Relation Age of Onset   Heart disease Mother 68       Stroke    Dementia Mother    Congestive Heart Failure Mother    Osteoporosis Mother    Stroke Mother    Heart disease Sister    Hypertension Sister    Diabetes Sister    Osteoporosis Sister    Heart disease Brother    Hyperlipidemia Brother    Diabetes Brother    Heart disease Sister    Hypertension Sister    Diabetes Sister    Diabetes Sister    Hypertension Sister    Diabetes Sister    Hypertension Sister     Diabetes Brother    Hyperlipidemia Brother    Cancer Father        lung   Heart attack Father    Diabetes Brother    Heart disease Brother    Hyperlipidemia Brother    Diabetes Brother    Diabetes Son    Colon cancer Neg Hx    Rectal cancer Neg Hx    Stomach cancer Neg Hx    Esophageal cancer Neg Hx     Social History   Tobacco Use   Smoking status: Former    Types: Cigarettes    Quit date: 02/09/1961    Years since quitting: 60.2   Smokeless tobacco: Never  Vaping Use   Vaping Use: Never used  Substance Use Topics   Alcohol use: No    Alcohol/week: 0.0 standard drinks   Drug use: No    Home Medications Prior to Admission medications   Medication Sig Start Date End Date Taking? Authorizing Provider  albuterol (VENTOLIN HFA) 108 (90 Base) MCG/ACT inhaler INHALE TWO PUFFS BY MOUTH 4 TIMES DAILY AS NEEDED Patient taking differently: Inhale 2 puffs into the lungs every 4 (four) hours as needed for wheezing or shortness of breath. 03/06/21   Dettinger, Fransisca Kaufmann, MD  apixaban (ELIQUIS) 5 MG TABS tablet Take 1 tablet (5 mg total) by mouth 2 (two) times daily. 03/22/21   Furth, Cadence H, PA-C  Calcium Citrate-Vitamin D (CALCIUM CITRATE + D) 315-250 MG-UNIT TABS Take 2 tablets by mouth daily. 12/10/15   Cherre Robins, PharmD  cetirizine (ZYRTEC) 10 MG tablet Take 5-10 mg by mouth at bedtime.     [provider]  Cholecalciferol (VITAMIN D) 2000 UNITS CAPS Take 1 capsule by mouth at bedtime.    [provider]  diclofenac Sodium (VOLTAREN) 1 % GEL Apply 2 g topically 4 (four) times daily. 11/05/20   Dettinger, Fransisca Kaufmann, MD  diltiazem (CARDIZEM CD) 300 MG 24 hr capsule Take 1 capsule (300 mg total) by mouth daily. 11/05/20   Dettinger, Fransisca Kaufmann, MD  diltiazem (TIAZAC) 300 MG 24 hr capsule Take by mouth. Patient not taking: No sig reported 11/05/20   [provider]  doxycycline (VIBRAMYCIN) 100 MG capsule Take 1 capsule (100 mg total) by mouth 2 (two) times daily for  7 days. 04/17/21 04/24/21  Luna Fuse, MD  esomeprazole (NEXIUM) 40 MG capsule TAKE 1 CAPSULE BY MOUTH EVERY DAY  11/05/20   Dettinger, Fransisca Kaufmann, MD  ferrous sulfate 325 (65 FE) MG tablet Take 1 tablet (325 mg total) by mouth at bedtime. 11/05/20   Dettinger, Fransisca Kaufmann, MD  ipratropium-albuterol (DUONEB) 0.5-2.5 (3) MG/3ML SOLN TAKE 3 MLS BY NEBULIZATION EVERY 6 (SIX) HOURS AS NEEDED. DX: ASTHMA 493.90 04/15/21   Dettinger, Fransisca Kaufmann, MD  metoprolol tartrate (LOPRESSOR) 25 MG tablet TAKE 1 TABLET (25 MG TOTAL) BY MOUTH DAILY AS NEEDED. 11/05/20   Dettinger, Fransisca Kaufmann, MD  montelukast (SINGULAIR) 10 MG tablet One tablet by mouth daily at bedtime 11/05/20   Dettinger, Fransisca Kaufmann, MD  Multiple Vitamins-Minerals (PRESERVISION/LUTEIN PO) Take 1 capsule by mouth at bedtime.     [provider]  nitroGLYCERIN (NITROLINGUAL) 0.4 MG/SPRAY spray ONE SPRAY UNDER TONGUE AS NEED FOR CHESTPAIN. MAY REPEAT IN 5 MINUTES IF PAIN NOT RELIEVED CALL 911. 11/17/18   Minus Breeding, MD  Polyethyl Glycol-Propyl Glycol (SYSTANE) 0.4-0.3 % SOLN Place 1 drop into both eyes 2 (two) times daily.    [provider]  predniSONE (DELTASONE) 10 MG tablet Take 10 mg by mouth See admin instructions. 6,5,4,3,2,1 dose down 04/12/21   [provider]  Respiratory Therapy Supplies (NEBULIZER/TUBING/MOUTHPIECE) KIT Use as directed with Nebulizer medications 03/28/21   Rakes, Connye Burkitt, FNP  RESTASIS 0.05 % ophthalmic emulsion Place 1 drop into both eyes 2 (two) times daily. Patient not taking: Reported on 04/17/2021 03/05/18   [provider]  rosuvastatin (CRESTOR) 20 MG tablet Take 1 tablet (20 mg total) by mouth daily. 03/23/21   Furth, Cadence H, PA-C    Allergies    Actonel [risedronate], Fosamax [alendronate sodium], Mevacor [lovastatin], Miacalcin [calcitonin (salmon)], and Mobic [meloxicam]  Review of Systems   Review of Systems  Constitutional:  Negative for chills and fever.  HENT:  Negative for ear pain and  sore throat.   Eyes:  Negative for pain and visual disturbance.  Respiratory:  Positive for shortness of breath. Negative for cough.   Cardiovascular:  Positive for leg swelling. Negative for chest pain and palpitations.  Gastrointestinal:  Negative for abdominal pain and vomiting.  Genitourinary:  Negative for dysuria and hematuria.  Musculoskeletal:  Negative for arthralgias and back pain.  Skin:  Negative for color change and rash.  Neurological:  Negative for seizures and syncope.  All other systems reviewed and are negative.  Physical Exam Updated Vital Signs BP 140/73   Pulse (!) 120   Temp 97.6 F (36.4 C) (Oral)   Resp 16   Ht 5' 3"  (1.6 m)   Wt 79.8 kg   SpO2 98%   BMI 31.18 kg/m   Physical Exam Vitals and nursing note reviewed.  Constitutional:      General: She is not in acute distress.    Appearance: She is well-developed.  HENT:     Head: Normocephalic and atraumatic.  Eyes:     Conjunctiva/sclera: Conjunctivae normal.  Cardiovascular:     Rate and Rhythm: Normal rate and regular rhythm.     Heart sounds: No murmur heard. Pulmonary:     Effort: Pulmonary effort is normal. No respiratory distress.     Breath sounds: Wheezing present.  Abdominal:     Palpations: Abdomen is soft.     Tenderness: There is no abdominal tenderness.  Musculoskeletal:     Cervical back: Neck supple.     Right lower leg: Edema present.     Left lower leg: Edema present.  Skin:    General: Skin is  warm and dry.  Neurological:     Mental Status: She is alert.    ED Results / Procedures / Treatments   Labs (all labs ordered are listed, but only abnormal results are displayed) Labs Reviewed  COMPREHENSIVE METABOLIC PANEL - Abnormal; Notable for the following components:      Result Value   Potassium 3.3 (*)    Glucose, Bld 176 (*)    Calcium 8.5 (*)    All other components within normal limits  BRAIN NATRIURETIC PEPTIDE - Abnormal; Notable for the following components:    B Natriuretic Peptide 137.0 (*)    All other components within normal limits  CBC WITH DIFFERENTIAL/PLATELET - Abnormal; Notable for the following components:   Lymphs Abs 4.3 (*)    Eosinophils Absolute 0.6 (*)    All other components within normal limits  RESP PANEL BY RT-PCR (FLU A&B, COVID) ARPGX2  TROPONIN I (HIGH SENSITIVITY)  TROPONIN I (HIGH SENSITIVITY)    EKG EKG Interpretation  Date/Time:  Saturday April 20 2021 22:01:41 EDT Ventricular Rate:  98 PR Interval:    QRS Duration: 95 QT Interval:  356 QTC Calculation: 455 R Axis:   22 Text Interpretation: Atrial fibrillation Ventricular premature complex Borderline low voltage, extremity leads Borderline ST depression, diffuse leads Confirmed by Thayer Jew [phone]) on 04/20/2021 11:21:03 PM  Radiology DG Chest Portable 1 View  Result Date: 04/20/2021 CLINICAL DATA:  Dyspnea, shortness of breath EXAM: PORTABLE CHEST 1 VIEW COMPARISON:  04/17/2021 FINDINGS: Lower lung volumes. Likely unchanged cardiac and mediastinal contours when accounting for differences in lung volumes and projection. Likely small left pleural effusion. No acute osseous abnormality. IMPRESSION: Suspect small left pleural effusion, however this could also represent prominent epicardial fat seen on the 03/20/2021 CT in the setting of lower lung volumes. Electronically Signed   By: Merilyn Baba M.D.   On: 04/20/2021 23:33    Procedures Procedures   Medications Ordered in ED Medications  ipratropium-albuterol (DUONEB) 0.5-2.5 (3) MG/3ML nebulizer solution 6 mL (6 mLs Nebulization Given 04/20/21 2226)  ipratropium (ATROVENT) 0.02 % nebulizer solution (0.5 mg  Given 04/20/21 2306)  albuterol (VENTOLIN) (5 MG/ML) 0.5% continuous inhalation solution (10 mg  Given 04/20/21 2306)    ED Course  I have reviewed the triage vital signs and the nursing notes.  Pertinent labs & imaging results that were available during my care of the patient were reviewed by  me and considered in my medical decision making (see chart for details).    MDM Rules/Calculators/A&P                           Patient seen the emergency department for evaluation of wheezing and shortness of breath.  Physical exam reveals bilateral expiratory wheezing and bilateral lower extremity edema.  Laboratory evaluation unremarkable outside of a BNP elevated to 137, hypokalemia to 3.3, high-sensitivity troponin unremarkable.  Initial ECG with A. fib, no evidence of ischemia.  Patient given 2 DuoNeb's here in the emergency department with persistent wheezing and is currently started on continuous albuterol.  Then signed out to oncoming provider.  Anticipate admission. Final Clinical Impression(s) / ED Diagnoses Final diagnoses:  COPD exacerbation [facility])    Rx / [place] Orders ED Discharge Orders     None        Daryn Pisani, Debe Coder, MD 04/21/21 [phone]

## 2021-04-20 NOTE — ED Notes (Signed)
94% on room air

## 2021-04-21 ENCOUNTER — Encounter (HOSPITAL_COMMUNITY): Payer: Self-pay | Admitting: Family Medicine

## 2021-04-21 DIAGNOSIS — R739 Hyperglycemia, unspecified: Secondary | ICD-10-CM | POA: Diagnosis not present

## 2021-04-21 DIAGNOSIS — J441 Chronic obstructive pulmonary disease with (acute) exacerbation: Secondary | ICD-10-CM | POA: Diagnosis present

## 2021-04-21 DIAGNOSIS — J9601 Acute respiratory failure with hypoxia: Secondary | ICD-10-CM | POA: Diagnosis not present

## 2021-04-21 DIAGNOSIS — E876 Hypokalemia: Secondary | ICD-10-CM | POA: Diagnosis present

## 2021-04-21 DIAGNOSIS — K219 Gastro-esophageal reflux disease without esophagitis: Secondary | ICD-10-CM

## 2021-04-21 LAB — BLOOD GAS, VENOUS
Acid-Base Excess: 0.3 mmol/L (ref 0.0–2.0)
Bicarbonate: 22.8 mmol/L (ref 20.0–28.0)
FIO2: 32
O2 Saturation: 73.4 %
Patient temperature: 36.5
pCO2, Ven: 59.2 mmHg (ref 44.0–60.0)
pH, Ven: 7.27 (ref 7.250–7.430)
pO2, Ven: 45.9 mmHg — ABNORMAL HIGH (ref 32.0–45.0)

## 2021-04-21 LAB — CBC
HCT: 42.7 % (ref 36.0–46.0)
Hemoglobin: 13.3 g/dL (ref 12.0–15.0)
MCH: 29.2 pg (ref 26.0–34.0)
MCHC: 31.1 g/dL (ref 30.0–36.0)
MCV: 93.6 fL (ref 80.0–100.0)
Platelets: 142 10*3/uL — ABNORMAL LOW (ref 150–400)
RBC: 4.56 MIL/uL (ref 3.87–5.11)
RDW: 13.1 % (ref 11.5–15.5)
WBC: 5.3 10*3/uL (ref 4.0–10.5)
nRBC: 0 % (ref 0.0–0.2)

## 2021-04-21 LAB — GLUCOSE, CAPILLARY
Glucose-Capillary: 171 mg/dL — ABNORMAL HIGH (ref 70–99)
Glucose-Capillary: 181 mg/dL — ABNORMAL HIGH (ref 70–99)
Glucose-Capillary: 137 mg/dL — ABNORMAL HIGH (ref 70–99)
Glucose-Capillary: 150 mg/dL — ABNORMAL HIGH (ref 70–99)
Glucose-Capillary: 260 mg/dL — ABNORMAL HIGH (ref 70–99)

## 2021-04-21 LAB — HEMOGLOBIN A1C
Hgb A1c MFr Bld: 6 % — ABNORMAL HIGH (ref 4.8–5.6)
Mean Plasma Glucose: 125.5 mg/dL

## 2021-04-21 LAB — TROPONIN I (HIGH SENSITIVITY): Troponin I (High Sensitivity): 7 ng/L (ref <18)

## 2021-04-21 LAB — COMPREHENSIVE METABOLIC PANEL
ALT: <5 U/L (ref 0–44)
AST: 39 U/L (ref 15–41)
Albumin: 3.8 g/dL (ref 3.5–5.0)
Alkaline Phosphatase: 47 U/L (ref 38–126)
Anion gap: 13 (ref 5–15)
BUN: 15 mg/dL (ref 8–23)
CO2: 23 mmol/L (ref 22–32)
Calcium: 9 mg/dL (ref 8.9–10.3)
Chloride: 103 mmol/L (ref 98–111)
Creatinine, Ser: 0.93 mg/dL (ref 0.44–1.00)
GFR, Estimated: >60 mL/min (ref >60)
Glucose, Bld: 193 mg/dL — ABNORMAL HIGH (ref 70–99)
Potassium: 3.4 mmol/L — ABNORMAL LOW (ref 3.5–5.1)
Sodium: 139 mmol/L (ref 135–145)
Total Bilirubin: 0.5 mg/dL (ref 0.3–1.2)
Total Protein: 6.7 g/dL (ref 6.5–8.1)

## 2021-04-21 LAB — MAGNESIUM: Magnesium: 2.1 mg/dL (ref 1.7–2.4)

## 2021-04-21 MED ORDER — PREDNISONE 20 MG PO TABS
40.0000 mg | ORAL_TABLET | Freq: Every day | ORAL | Status: DC
  Administered 2021-04-22 – 2021-04-23 (×2): 40 mg via ORAL
  Filled 2021-04-21 (×2): qty 2

## 2021-04-21 MED ORDER — ONDANSETRON HCL 4 MG PO TABS: 4.0000 mg | ORAL_TABLET | Freq: Four times a day (QID) | ORAL | Status: DC | PRN

## 2021-04-21 MED ORDER — BUDESONIDE 0.5 MG/2ML IN SUSP
2.0000 mg | Freq: Four times a day (QID) | RESPIRATORY_TRACT | Status: DC
  Administered 2021-04-21: 2 mg via RESPIRATORY_TRACT
  Filled 2021-04-21: qty 8

## 2021-04-21 MED ORDER — DILTIAZEM HCL ER COATED BEADS 180 MG PO CP24
300.0000 mg | ORAL_CAPSULE | Freq: Every day | ORAL | Status: DC
  Administered 2021-04-21 – 2021-04-24 (×4): 300 mg via ORAL
  Filled 2021-04-21 (×4): qty 1

## 2021-04-21 MED ORDER — PANTOPRAZOLE SODIUM 40 MG PO TBEC
40.0000 mg | DELAYED_RELEASE_TABLET | Freq: Every day | ORAL | Status: DC
  Administered 2021-04-21 – 2021-04-24 (×4): 40 mg via ORAL
  Filled 2021-04-21 (×4): qty 1

## 2021-04-21 MED ORDER — ACETAMINOPHEN 650 MG RE SUPP: 650.0000 mg | Freq: Four times a day (QID) | RECTAL | Status: DC | PRN

## 2021-04-21 MED ORDER — ONDANSETRON HCL 4 MG/2ML IJ SOLN: 4.0000 mg | Freq: Four times a day (QID) | INTRAMUSCULAR | Status: DC | PRN

## 2021-04-21 MED ORDER — POTASSIUM CHLORIDE CRYS ER 20 MEQ PO TBCR
20.0000 meq | EXTENDED_RELEASE_TABLET | Freq: Once | ORAL | Status: AC
  Administered 2021-04-21: 20 meq via ORAL
  Filled 2021-04-21: qty 1

## 2021-04-21 MED ORDER — APIXABAN 5 MG PO TABS
5.0000 mg | ORAL_TABLET | Freq: Two times a day (BID) | ORAL | Status: DC
  Administered 2021-04-21 – 2021-04-24 (×7): 5 mg via ORAL
  Filled 2021-04-21 (×7): qty 1

## 2021-04-21 MED ORDER — METOPROLOL TARTRATE 5 MG/5ML IV SOLN
5.0000 mg | Freq: Once | INTRAVENOUS | Status: AC
  Administered 2021-04-21: 5 mg via INTRAVENOUS
  Filled 2021-04-21: qty 5

## 2021-04-21 MED ORDER — ROSUVASTATIN CALCIUM 20 MG PO TABS
20.0000 mg | ORAL_TABLET | Freq: Every day | ORAL | Status: DC
  Administered 2021-04-21 – 2021-04-24 (×4): 20 mg via ORAL
  Filled 2021-04-21 (×4): qty 1

## 2021-04-21 MED ORDER — MONTELUKAST SODIUM 10 MG PO TABS
10.0000 mg | ORAL_TABLET | Freq: Every day | ORAL | Status: DC
  Administered 2021-04-21 – 2021-04-23 (×3): 10 mg via ORAL
  Filled 2021-04-21 (×3): qty 1

## 2021-04-21 MED ORDER — INSULIN ASPART 100 UNIT/ML IJ SOLN
0.0000 [IU] | Freq: Three times a day (TID) | INTRAMUSCULAR | Status: DC
  Administered 2021-04-21: 2 [IU] via SUBCUTANEOUS
  Administered 2021-04-21: 3 [IU] via SUBCUTANEOUS
  Administered 2021-04-21: 2 [IU] via SUBCUTANEOUS
  Administered 2021-04-22 (×2): 3 [IU] via SUBCUTANEOUS
  Administered 2021-04-22 – 2021-04-23 (×2): 2 [IU] via SUBCUTANEOUS
  Administered 2021-04-23 – 2021-04-24 (×3): 3 [IU] via SUBCUTANEOUS

## 2021-04-21 MED ORDER — METOPROLOL TARTRATE 25 MG PO TABS
25.0000 mg | ORAL_TABLET | Freq: Every day | ORAL | Status: DC
  Administered 2021-04-21 – 2021-04-24 (×4): 25 mg via ORAL
  Filled 2021-04-21 (×4): qty 1

## 2021-04-21 MED ORDER — IPRATROPIUM-ALBUTEROL 0.5-2.5 (3) MG/3ML IN SOLN
3.0000 mL | Freq: Two times a day (BID) | RESPIRATORY_TRACT | Status: DC
  Administered 2021-04-22 – 2021-04-23 (×3): 3 mL via RESPIRATORY_TRACT
  Filled 2021-04-21 (×3): qty 3

## 2021-04-21 MED ORDER — POTASSIUM CHLORIDE 20 MEQ PO PACK
40.0000 meq | PACK | Freq: Once | ORAL | Status: AC
  Administered 2021-04-21: 40 meq via ORAL
  Filled 2021-04-21: qty 2

## 2021-04-21 MED ORDER — INSULIN ASPART 100 UNIT/ML IJ SOLN
0.0000 [IU] | Freq: Every day | INTRAMUSCULAR | Status: DC
  Administered 2021-04-21: 3 [IU] via SUBCUTANEOUS

## 2021-04-21 MED ORDER — BUDESONIDE 0.5 MG/2ML IN SUSP
0.5000 mg | Freq: Two times a day (BID) | RESPIRATORY_TRACT | Status: DC
  Administered 2021-04-21 – 2021-04-24 (×5): 0.5 mg via RESPIRATORY_TRACT
  Filled 2021-04-21 (×5): qty 2

## 2021-04-21 MED ORDER — IPRATROPIUM-ALBUTEROL 0.5-2.5 (3) MG/3ML IN SOLN
3.0000 mL | Freq: Four times a day (QID) | RESPIRATORY_TRACT | Status: DC
  Administered 2021-04-21 (×3): 3 mL via RESPIRATORY_TRACT
  Filled 2021-04-21 (×3): qty 3

## 2021-04-21 MED ORDER — ALBUTEROL SULFATE (2.5 MG/3ML) 0.083% IN NEBU: 2.5000 mg | INHALATION_SOLUTION | RESPIRATORY_TRACT | Status: DC | PRN

## 2021-04-21 MED ORDER — OXYCODONE HCL 5 MG PO TABS
5.0000 mg | ORAL_TABLET | ORAL | Status: DC | PRN
  Administered 2021-04-22 – 2021-04-23 (×2): 5 mg via ORAL
  Filled 2021-04-21 (×2): qty 1

## 2021-04-21 MED ORDER — LORATADINE 10 MG PO TABS
10.0000 mg | ORAL_TABLET | Freq: Every day | ORAL | Status: DC
  Administered 2021-04-21 – 2021-04-24 (×4): 10 mg via ORAL
  Filled 2021-04-21 (×4): qty 1

## 2021-04-21 MED ORDER — GUAIFENESIN-CODEINE 100-10 MG/5ML PO SOLN
5.0000 mL | Freq: Four times a day (QID) | ORAL | Status: DC | PRN
  Administered 2021-04-22 – 2021-04-23 (×2): 5 mL via ORAL
  Filled 2021-04-21 (×3): qty 5

## 2021-04-21 MED ORDER — METHYLPREDNISOLONE SODIUM SUCC 125 MG IJ SOLR
125.0000 mg | Freq: Every day | INTRAMUSCULAR | Status: AC
  Administered 2021-04-21: 125 mg via INTRAVENOUS
  Filled 2021-04-21: qty 2

## 2021-04-21 MED ORDER — ACETAMINOPHEN 325 MG PO TABS: 650.0000 mg | ORAL_TABLET | Freq: Four times a day (QID) | ORAL | Status: DC | PRN

## 2021-04-21 NOTE — Care Management Obs Status (Signed)
[place] NOTIFICATION   Patient Details  Name: Krystal [name] MRN: [id number] Date of Birth: 08-25-1940   Medicare Observation Status Notification Given:  Yes    Natasha Bence, LCSW 04/21/2021, 5:05 PM

## 2021-04-21 NOTE — ED Notes (Signed)
Pt remains in a. Fib

## 2021-04-21 NOTE — ED Notes (Signed)
Neb finished. Placed back on 2L [place]. EDP notified.

## 2021-04-21 NOTE — Progress Notes (Signed)
ASSUMPTION OF CARE NOTE   04/21/2021 4:33 PM  Krystal [name] was seen and examined.  The H&P by the admitting provider, orders, imaging was reviewed.  Please see new orders.  Will continue to follow.   Vitals:   04/21/21 1400 04/21/21 1500  BP: 120/64   Pulse: 98   Resp:    Temp: 98.2 F (36.8 C)   SpO2: 96% 97%    Results for orders placed or performed during the hospital encounter of 04/20/21  Resp Panel by RT-PCR (Flu A&B, Covid) Nasopharyngeal Swab   Specimen: Nasopharyngeal Swab; Nasopharyngeal(NP) swabs in vial transport medium  Result Value Ref Range   SARS Coronavirus 2 by RT PCR NEGATIVE NEGATIVE   Influenza A by PCR NEGATIVE NEGATIVE   Influenza B by PCR NEGATIVE NEGATIVE  Comprehensive metabolic panel  Result Value Ref Range   Sodium 137 135 - 145 mmol/L   Potassium 3.3 (L) 3.5 - 5.1 mmol/L   Chloride 104 98 - 111 mmol/L   CO2 26 22 - 32 mmol/L   Glucose, Bld 176 (H) 70 - 99 mg/dL   BUN 11 8 - 23 mg/dL   Creatinine, Ser 0.79 0.44 - 1.00 mg/dL   Calcium 8.5 (L) 8.9 - 10.3 mg/dL   Total Protein 6.6 6.5 - 8.1 g/dL   Albumin 3.8 3.5 - 5.0 g/dL   AST 36 15 - 41 U/L   ALT 29 0 - 44 U/L   Alkaline Phosphatase 47 38 - 126 U/L   Total Bilirubin 0.4 0.3 - 1.2 mg/dL   GFR, Estimated >60 >60 mL/min   Anion gap 7 5 - 15  Brain natriuretic peptide  Result Value Ref Range   B Natriuretic Peptide 137.0 (H) 0.0 - 100.0 pg/mL  CBC with Differential  Result Value Ref Range   WBC 9.2 4.0 - 10.5 K/uL   RBC 4.59 3.87 - 5.11 MIL/uL   Hemoglobin 13.6 12.0 - 15.0 g/dL   HCT 43.6 36.0 - 46.0 %   MCV 95.0 80.0 - 100.0 fL   MCH 29.6 26.0 - 34.0 pg   MCHC 31.2 30.0 - 36.0 g/dL   RDW 13.2 11.5 - 15.5 %   Platelets 160 150 - 400 K/uL   nRBC 0.0 0.0 - 0.2 %   Neutrophils Relative % 40 %   Neutro Abs 3.7 1.7 - 7.7 K/uL   Lymphocytes Relative 48 %   Lymphs Abs 4.3 (H) 0.7 - 4.0 K/uL   Monocytes Relative 6 %   Monocytes Absolute 0.6 0.1 - 1.0 K/uL   Eosinophils Relative 6 %    Eosinophils Absolute 0.6 (H) 0.0 - 0.5 K/uL   Basophils Relative 0 %   Basophils Absolute 0.0 0.0 - 0.1 K/uL   Immature Granulocytes 0 %   Abs Immature Granulocytes 0.02 0.00 - 0.07 K/uL  Blood gas, venous  Result Value Ref Range   FIO2 32.00    pH, Ven 7.270 7.250 - 7.430   pCO2, Ven 59.2 44.0 - 60.0 mmHg   pO2, Ven 45.9 (H) 32.0 - 45.0 mmHg   Bicarbonate 22.8 20.0 - 28.0 mmol/L   Acid-Base Excess 0.3 0.0 - 2.0 mmol/L   O2 Saturation 73.4 %   Patient temperature 36.5   Magnesium  Result Value Ref Range   Magnesium 2.1 1.7 - 2.4 mg/dL  Comprehensive metabolic panel  Result Value Ref Range   Sodium 139 135 - 145 mmol/L   Potassium 3.4 (L) 3.5 - 5.1 mmol/L   Chloride 103  98 - 111 mmol/L   CO2 23 22 - 32 mmol/L   Glucose, Bld 193 (H) 70 - 99 mg/dL   BUN 15 8 - 23 mg/dL   Creatinine, Ser 0.93 0.44 - 1.00 mg/dL   Calcium 9.0 8.9 - 10.3 mg/dL   Total Protein 6.7 6.5 - 8.1 g/dL   Albumin 3.8 3.5 - 5.0 g/dL   AST 39 15 - 41 U/L   ALT <5 0 - 44 U/L   Alkaline Phosphatase 47 38 - 126 U/L   Total Bilirubin 0.5 0.3 - 1.2 mg/dL   GFR, Estimated >60 >60 mL/min   Anion gap 13 5 - 15  CBC  Result Value Ref Range   WBC 5.3 4.0 - 10.5 K/uL   RBC 4.56 3.87 - 5.11 MIL/uL   Hemoglobin 13.3 12.0 - 15.0 g/dL   HCT 42.7 36.0 - 46.0 %   MCV 93.6 80.0 - 100.0 fL   MCH 29.2 26.0 - 34.0 pg   MCHC 31.1 30.0 - 36.0 g/dL   RDW 13.1 11.5 - 15.5 %   Platelets 142 (L) 150 - 400 K/uL   nRBC 0.0 0.0 - 0.2 %  Hemoglobin A1c  Result Value Ref Range   Hgb A1c MFr Bld 6.0 (H) 4.8 - 5.6 %   Mean Plasma Glucose 125.5 mg/dL  Glucose, capillary  Result Value Ref Range   Glucose-Capillary 171 (H) 70 - 99 mg/dL  Glucose, capillary  Result Value Ref Range   Glucose-Capillary 181 (H) 70 - 99 mg/dL  Glucose, capillary  Result Value Ref Range   Glucose-Capillary 137 (H) 70 - 99 mg/dL  Glucose, capillary  Result Value Ref Range   Glucose-Capillary 150 (H) 70 - 99 mg/dL  Troponin I (High Sensitivity)   Result Value Ref Range   Troponin I (High Sensitivity) 7 <18 ng/L  Troponin I (High Sensitivity)  Result Value Ref Range   Troponin I (High Sensitivity) 7 <18 ng/L   Time spent: 33 mins   Murvin Natal, MD [facility]   04/20/2021  9:56 PM How to contact the [facility], [facility], [facility] Attending or Consulting provider [place] or covering provider during after hours 7P -7A, for this patient?  Check the care team in [facility] and look for a) attending/consulting [facility] provider listed and b) the [facility] team listed Log into [email or web address] and use [place] universal password to access. If you do not have the password, please contact the hospital operator. Locate the [facility] provider you are looking for under [facility] and page to a number that you can be directly reached. If you still have difficulty reaching the provider, please page the [facility] (Director on Call) for the Hospitalists listed on amion for assistance.

## 2021-04-21 NOTE — ED Notes (Signed)
On hold to give report

## 2021-04-21 NOTE — H&P (Signed)
TRH H&P    Patient Demographics:    Krystal [name], is a 80 y.o. female  MRN: [id number]  DOB - 12-03-1940  Admit Date - 04/20/2021  Referring MD/NP/PA: [place]  Outpatient Primary MD for the patient is Dettinger, Fransisca Kaufmann, MD  Patient coming from: Home  Chief complaint-dyspnea   HPI:    Krystal [name]  is a 80 y.o. female, with history of asthma, brain tumor, breast cancer, COPD, hyperlipidemia, macular degeneration, atrial fibrillation, and more presents the ED with a chief complaint of dyspnea.  Patient expresses that she is tired and had just fallen asleep at the time of my interview.  She is count of resistant to answer some questions, and for that reason history may be slightly limited.  Patient reports that she has had a rattling in her chest and dyspnea.  Its been worse today but she has had over 1 month.  She said what was worse about a today was the rattling.  Her dyspnea and rattling are worse with exertion.  She has had palpitations associated.  She reports that she did take her Eliquis and diltiazem this morning but did not take her metoprolol today.  She is felt generally fatigued.  She admits to wheezing and reports that she has tried her albuterol inhaler without relief.  She has had a dry cough.  She denies fevers.  Patient reports 2-3 pillow orthopnea at home.  She has no oxygen requirement at baseline.  Patient has no other complaints at this time.  Patient does not smoke, does not drink alcohol, does not use illicit drugs.  Patient is vaccinated for COVID.  Patient is full code.  In the ED Temp 97.6 heart rate 91-120, respiratory rate 16, blood pressure 140/73, satting at 98% on 2 L No leukocytosis, hemoglobin 13.6 Hypokalemia at 3.3, hyperglycemia 176 BNP is 137 Troponin 7 Negative respiratory panel Chest x-ray shows suspected small left pleural effusion however this could be a prominent  epicardial fat pad that had been seen on the August 2022 CT EKG shows A. fib, QTC 455 Tropes are 13, 13, 7 DuoNeb and then hour-long neb given in the ED Heart rate did bump up to 130s during the neb treatments, back down to 120 after treatments, given metoprolol Admission requested for COPD exacerbation and acute respiratory failure with hypoxia     Review of systems:    In addition to the HPI above,  No Fever-chills, admits to fatigue No Headache, No changes with Vision or hearing, No problems swallowing food or Liquids, No Abdominal pain, No Nausea or Vomiting, bowel movements are regular, No Blood in stool or Urine, No dysuria, No new skin rashes or bruises, No new joints pains-aches,  No new weakness, tingling, numbness in any extremity, No recent weight gain or loss, No polyuria, polydypsia or polyphagia, No significant Mental Stressors.  All other systems reviewed and are negative.    Past History of the following :    Past Medical History:  Diagnosis Date   Anemia    Asthma  Brain tumor ([place])    1986   Breast cancer ([place])    Right breast   Cataract    Colon polyps    COPD (chronic obstructive pulmonary disease) (HCC)    Hiatal hernia    Hyperlipidemia    Leg edema    Macular degeneration    Osteoporosis    Pneumonia    PVD (posterior vitreous detachment)    Right knee pain    Shingles rash       Past Surgical History:  Procedure Laterality Date   CATARACT EXTRACTION, BILATERAL     COLONOSCOPY     CORONARY PRESSURE WIRE/FFR WITH 3D MAPPING N/A 03/21/2021   Procedure: Coronary Pressure Wire/FFR w/3D Mapping;  Surgeon: Nelva Bush, MD;  Location: [facility];  Service: Cardiovascular;  Laterality: N/A;   CRANIECTOMY / CRANIOTOMY FOR EXCISION OF BRAIN TUMOR     Left side brain   LEFT HEART CATH AND CORONARY ANGIOGRAPHY N/A 03/21/2021   Procedure: LEFT HEART CATH AND CORONARY ANGIOGRAPHY;  Surgeon: Nelva Bush, MD;  Location: [facility];  Service: Cardiovascular;  Laterality: N/A;   MASTECTOMY Right    PARTIAL HYSTERECTOMY     POLYPECTOMY     SKIN CANCER EXCISION Left    Under left eye      Social History:      Social History   Tobacco Use   Smoking status: Former    Types: Cigarettes    Quit date: 02/09/1961    Years since quitting: 60.2   Smokeless tobacco: Never  Substance Use Topics   Alcohol use: No    Alcohol/week: 0.0 standard drinks       Family History :     Family History  Problem Relation Age of Onset   Heart disease Mother 52       Stroke    Dementia Mother    Congestive Heart Failure Mother    Osteoporosis Mother    Stroke Mother    Heart disease Sister    Hypertension Sister    Diabetes Sister    Osteoporosis Sister    Heart disease Brother    Hyperlipidemia Brother    Diabetes Brother    Heart disease Sister    Hypertension Sister    Diabetes Sister    Diabetes Sister    Hypertension Sister    Diabetes Sister    Hypertension Sister    Diabetes Brother    Hyperlipidemia Brother    Cancer Father        lung   Heart attack Father    Diabetes Brother    Heart disease Brother    Hyperlipidemia Brother    Diabetes Brother    Diabetes Son    Colon cancer Neg Hx    Rectal cancer Neg Hx    Stomach cancer Neg Hx    Esophageal cancer Neg Hx       Home Medications:   Prior to Admission medications   Medication Sig Start Date End Date Taking? Authorizing Provider  albuterol (VENTOLIN HFA) 108 (90 Base) MCG/ACT inhaler INHALE TWO PUFFS BY MOUTH 4 TIMES DAILY AS NEEDED Patient taking differently: Inhale 2 puffs into the lungs every 4 (four) hours as needed for wheezing or shortness of breath. 03/06/21   Dettinger, Fransisca Kaufmann, MD  apixaban (ELIQUIS) 5 MG TABS tablet Take 1 tablet (5 mg total) by mouth 2 (two) times daily. 03/22/21   Furth, Cadence H, PA-C  Calcium Citrate-Vitamin D (CALCIUM CITRATE + D)  315-250 MG-UNIT TABS Take 2 tablets by mouth daily. 12/10/15    Cherre Robins, PharmD  cetirizine (ZYRTEC) 10 MG tablet Take 5-10 mg by mouth at bedtime.     [provider]  Cholecalciferol (VITAMIN D) 2000 UNITS CAPS Take 1 capsule by mouth at bedtime.    [provider]  diclofenac Sodium (VOLTAREN) 1 % GEL Apply 2 g topically 4 (four) times daily. 11/05/20   Dettinger, Fransisca Kaufmann, MD  diltiazem (CARDIZEM CD) 300 MG 24 hr capsule Take 1 capsule (300 mg total) by mouth daily. 11/05/20   Dettinger, Fransisca Kaufmann, MD  diltiazem (TIAZAC) 300 MG 24 hr capsule Take by mouth. Patient not taking: No sig reported 11/05/20   [provider]  doxycycline (VIBRAMYCIN) 100 MG capsule Take 1 capsule (100 mg total) by mouth 2 (two) times daily for 7 days. 04/17/21 04/24/21  Luna Fuse, MD  esomeprazole (NEXIUM) 40 MG capsule TAKE 1 CAPSULE BY MOUTH EVERY DAY 11/05/20   Dettinger, Fransisca Kaufmann, MD  ferrous sulfate 325 (65 FE) MG tablet Take 1 tablet (325 mg total) by mouth at bedtime. 11/05/20   Dettinger, Fransisca Kaufmann, MD  ipratropium-albuterol (DUONEB) 0.5-2.5 (3) MG/3ML SOLN TAKE 3 MLS BY NEBULIZATION EVERY 6 (SIX) HOURS AS NEEDED. DX: ASTHMA 493.90 04/15/21   Dettinger, Fransisca Kaufmann, MD  metoprolol tartrate (LOPRESSOR) 25 MG tablet TAKE 1 TABLET (25 MG TOTAL) BY MOUTH DAILY AS NEEDED. 11/05/20   Dettinger, Fransisca Kaufmann, MD  montelukast (SINGULAIR) 10 MG tablet One tablet by mouth daily at bedtime 11/05/20   Dettinger, Fransisca Kaufmann, MD  Multiple Vitamins-Minerals (PRESERVISION/LUTEIN PO) Take 1 capsule by mouth at bedtime.     [provider]  nitroGLYCERIN (NITROLINGUAL) 0.4 MG/SPRAY spray ONE SPRAY UNDER TONGUE AS NEED FOR CHESTPAIN. MAY REPEAT IN 5 MINUTES IF PAIN NOT RELIEVED CALL 911. 11/17/18   Minus Breeding, MD  Polyethyl Glycol-Propyl Glycol (SYSTANE) 0.4-0.3 % SOLN Place 1 drop into both eyes 2 (two) times daily.    [provider]  predniSONE (DELTASONE) 10 MG tablet Take 10 mg by mouth See admin instructions. 6,5,4,3,2,1 dose down 04/12/21   [provider]  Respiratory Therapy Supplies (NEBULIZER/TUBING/MOUTHPIECE) KIT Use as directed with Nebulizer medications 03/28/21   Rakes, Connye Burkitt, FNP  RESTASIS 0.05 % ophthalmic emulsion Place 1 drop into both eyes 2 (two) times daily. Patient not taking: Reported on 04/17/2021 03/05/18   [provider]  rosuvastatin (CRESTOR) 20 MG tablet Take 1 tablet (20 mg total) by mouth daily. 03/23/21   Furth, Cadence H, PA-C     Allergies:     Allergies  Allergen Reactions   Actonel [Risedronate] Nausea And Vomiting and Other (See Comments)    Throat tightness   Fosamax [Alendronate Sodium] Other (See Comments)    esophagitis   Mevacor [Lovastatin] Other (See Comments)    myalgias   Miacalcin [Calcitonin ([place])] Nausea And Vomiting   Mobic [Meloxicam] Hives and Itching    rash     Physical Exam:   Vitals  Blood pressure 124/67, pulse (!) 106, temperature 98 F (36.7 C), resp. rate 18, height 5' 3" (1.6 m), weight 79.8 kg, SpO2 96 %.  1.  General: Patient lying supine in bed,  no acute distress   2. Psychiatric: Alert and oriented x 3, mood and behavior normal for situation, pleasant and cooperative with exam   3. Neurologic: Speech and language are normal, face is symmetric, moves all 4 extremities voluntarily, at baseline without acute deficits  on limited exam   4. HEENMT:  Head is atraumatic, normocephalic, pupils reactive to light, neck is supple, trachea is midline, mucous membranes are moist   5. Respiratory : Mild wheezing in the bilateral lung fields, rhonchi, rales, no cyanosis, no increase in work of breathing or accessory muscle use, 2 L nasal cannula in place   6. Cardiovascular : Heart rate tachycardic, rhythm is irregularly irregular, no murmurs, rubs or gallops, peripheral edema present, peripheral pulses palpated   7. Gastrointestinal:  Abdomen is soft, nondistended, nontender to palpation bowel sounds active, no masses or organomegaly palpated   8.  Skin:  Skin is warm, dry and intact without rashes, acute lesions, or ulcers on limited exam   9.Musculoskeletal:  No acute deformities or trauma, no asymmetry in tone, peripheral edema present, peripheral pulses palpated, no tenderness to palpation in the extremities     Data Review:    CBC Recent Labs  Lab 04/17/21 1459 04/20/21 2200  WBC 5.8 9.2  HGB 13.4 13.6  HCT 43.0 43.6  PLT 143* 160  MCV 95.3 95.0  MCH 29.7 29.6  MCHC 31.2 31.2  RDW 13.1 13.2  LYMPHSABS 1.7 4.3*  MONOABS 0.4 0.6  EOSABS 0.4 0.6*  BASOSABS 0.0 0.0   ------------------------------------------------------------------------------------------------------------------  Results for orders placed or performed during the hospital encounter of 04/20/21 (from the past 48 hour(s))  Comprehensive metabolic panel     Status: Abnormal   Collection Time: 04/20/21 10:00 PM  Result Value Ref Range   Sodium 137 135 - 145 mmol/L   Potassium 3.3 (L) 3.5 - 5.1 mmol/L   Chloride 104 98 - 111 mmol/L   CO2 26 22 - 32 mmol/L   Glucose, Bld 176 (H) 70 - 99 mg/dL    Comment: Glucose reference range applies only to samples taken after fasting for at least 8 hours.   BUN 11 8 - 23 mg/dL   Creatinine, Ser 0.79 0.44 - 1.00 mg/dL   Calcium 8.5 (L) 8.9 - 10.3 mg/dL   Total Protein 6.6 6.5 - 8.1 g/dL   Albumin 3.8 3.5 - 5.0 g/dL   AST 36 15 - 41 U/L   ALT 29 0 - 44 U/L   Alkaline Phosphatase 47 38 - 126 U/L   Total Bilirubin 0.4 0.3 - 1.2 mg/dL   GFR, Estimated >60 >60 mL/min    Comment: (NOTE) Calculated using the CKD-EPI Creatinine Equation (2021)    Anion gap 7 5 - 15    Comment: Performed at [facility], [address] [place], [place] [address]  Brain natriuretic peptide     Status: Abnormal   Collection Time: 04/20/21 10:00 PM  Result Value Ref Range   B Natriuretic Peptide 137.0 (H) 0.0 - 100.0 pg/mL    Comment: Performed at [facility], [address] [place], [place] [address]  Troponin I (High  Sensitivity)     Status: None   Collection Time: 04/20/21 10:00 PM  Result Value Ref Range   Troponin I (High Sensitivity) 7 <18 ng/L    Comment: (NOTE) Elevated high sensitivity troponin I (hsTnI) values and significant  changes across serial measurements may suggest ACS but many other  chronic and acute conditions are known to elevate hsTnI results.  Refer to the "Links" section for chest pain algorithms and additional  guidance. Performed at [facility], [address] [place], [place] [address]   CBC with Differential     Status: Abnormal   Collection Time: 04/20/21 10:00 PM  Result Value Ref Range   WBC 9.2 4.0 - 10.5 K/uL   RBC 4.59 3.87 - 5.11 MIL/uL   Hemoglobin 13.6 12.0 - 15.0 g/dL   HCT 43.6 36.0 - 46.0 %   MCV 95.0 80.0 - 100.0 fL   MCH 29.6 26.0 - 34.0 pg   MCHC 31.2 30.0 - 36.0 g/dL   RDW 13.2 11.5 - 15.5 %   Platelets 160 150 - 400 K/uL   nRBC 0.0 0.0 - 0.2 %   Neutrophils Relative % 40 %   Neutro Abs 3.7 1.7 - 7.7 K/uL   Lymphocytes Relative 48 %   Lymphs Abs 4.3 (H) 0.7 - 4.0 K/uL   Monocytes Relative 6 %   Monocytes Absolute 0.6 0.1 - 1.0 K/uL   Eosinophils Relative 6 %   Eosinophils Absolute 0.6 (H) 0.0 - 0.5 K/uL   Basophils Relative 0 %   Basophils Absolute 0.0 0.0 - 0.1 K/uL   Immature Granulocytes 0 %   Abs Immature Granulocytes 0.02 0.00 - 0.07 K/uL    Comment: Performed at [facility], [address] [place], [place] [address]  Resp Panel by RT-PCR (Flu A&B, Covid) Nasopharyngeal Swab     Status: None   Collection Time: 04/20/21 10:15 PM   Specimen: Nasopharyngeal Swab; Nasopharyngeal(NP) swabs in vial transport medium  Result Value Ref Range   SARS Coronavirus 2 by RT PCR NEGATIVE NEGATIVE    Comment: (NOTE) SARS-CoV-2 target nucleic acids are NOT DETECTED.  The SARS-CoV-2 RNA is generally detectable in upper respiratory specimens during the acute phase of infection. The lowest concentration of SARS-CoV-2 viral copies this assay can detect  is 138 copies/mL. A negative result does not preclude SARS-Cov-2 infection and should not be used as the sole basis for treatment or other patient management decisions. A negative result may occur with  improper specimen collection/handling, submission of specimen other than nasopharyngeal swab, presence of viral mutation(s) within the areas targeted by this assay, and inadequate number of viral copies(<138 copies/mL). A negative result must be combined with clinical observations, patient history, and epidemiological information. The expected result is Negative.  Fact Sheet for Patients:  EntrepreneurPulse.com.au  Fact Sheet for Healthcare Providers:  IncredibleEmployment.be  This test is no t yet approved or cleared by the [place] FDA and  has been authorized for detection and/or diagnosis of SARS-CoV-2 by FDA under an Emergency Use Authorization (EUA). This EUA will remain  in effect (meaning this test can be used) for the duration of the COVID-19 declaration under Section 564(b)(1) of the Act, 21 U.S.C.section 360bbb-3(b)(1), unless the authorization is terminated  or revoked sooner.       Influenza A by PCR NEGATIVE NEGATIVE   Influenza B by PCR NEGATIVE NEGATIVE    Comment: (NOTE) The Xpert Xpress SARS-CoV-2/FLU/RSV plus assay is intended as an aid in the diagnosis of influenza from Nasopharyngeal swab specimens and should not be used as a sole basis for treatment. Nasal washings and aspirates are unacceptable for Xpert Xpress SARS-CoV-2/FLU/RSV testing.  Fact Sheet for Patients: EntrepreneurPulse.com.au  Fact Sheet for Healthcare Providers: IncredibleEmployment.be  This test is not yet approved or cleared by the [place] FDA and has been authorized for detection and/or diagnosis of SARS-CoV-2 by FDA under an Emergency Use Authorization (EUA). This EUA will remain in effect (meaning this  test can be used) for the duration of the COVID-19 declaration under Section 564(b)(1) of the Act, 21 U.S.C. section 360bbb-3(b)(1), unless the authorization is terminated or revoked.  Performed at [facility], [address] [place], [place] [address]   Troponin I (High Sensitivity)     Status: None   Collection Time: 04/21/21 12:14 AM  Result Value Ref Range   Troponin I (High Sensitivity) 7 <18 ng/L    Comment: (NOTE) Elevated high sensitivity troponin I (hsTnI) values and significant  changes across serial measurements may suggest ACS but many other  chronic and acute conditions are known to elevate hsTnI results.  Refer to the "Links" section for chest pain algorithms and additional  guidance. Performed at [facility], [address] [place], [place] [address]   Blood gas, venous     Status: Abnormal   Collection Time: 04/21/21  1:21 AM  Result Value Ref Range   FIO2 32.00    pH, Ven 7.270 7.250 - 7.430   pCO2, Ven 59.2 44.0 - 60.0 mmHg   pO2, Ven 45.9 (H) 32.0 - 45.0 mmHg   Bicarbonate 22.8 20.0 - 28.0 mmol/L   Acid-Base Excess 0.3 0.0 - 2.0 mmol/L   O2 Saturation 73.4 %   Patient temperature 36.5     Comment: Performed at [facility], [address], [place], [place] [address]  Glucose, capillary     Status: Abnormal   Collection Time: 04/21/21  6:02 AM  Result Value Ref Range   Glucose-Capillary 171 (H) 70 - 99 mg/dL    Comment: Glucose reference range applies only to samples taken after fasting for at least 8 hours.    Chemistries  Recent Labs  Lab 04/17/21 1459 04/20/21 2200  NA 140 137  K 3.7 3.3*  CL 105 104  CO2 28 26  GLUCOSE 87 176*  BUN 18 11  CREATININE 0.67 0.79  CALCIUM 8.8* 8.5*  MG 2.1  --   AST  --  36  ALT  --  29  ALKPHOS  --  47  BILITOT  --  0.4    ------------------------------------------------------------------------------------------------------------------  ------------------------------------------------------------------------------------------------------------------ GFR: Estimated Creatinine Clearance: 55.2 mL/min (by C-G formula based on SCr of 0.79 mg/dL). Liver Function Tests: Recent Labs  Lab 04/20/21 2200  AST 36  ALT 29  ALKPHOS 47  BILITOT 0.4  PROT 6.6  ALBUMIN 3.8   No results for input(s): LIPASE, AMYLASE in the last 168 hours. No results for input(s): AMMONIA in the last 168 hours. Coagulation Profile: No results for input(s): INR, PROTIME in the last 168 hours. Cardiac Enzymes: No results for input(s): CKTOTAL, CKMB, CKMBINDEX, TROPONINI in the last 168 hours. BNP (last 3 results) No results for input(s): PROBNP in the last 8760 hours. HbA1C: No results for input(s): HGBA1C in the last 72 hours. CBG: Recent Labs  Lab 04/21/21 0602  GLUCAP 171*   Lipid Profile: No results for input(s): CHOL, HDL, LDLCALC, TRIG, CHOLHDL, LDLDIRECT in the last 72 hours. Thyroid Function Tests: No results for input(s): TSH, T4TOTAL, FREET4, T3FREE, THYROIDAB in the last 72 hours. Anemia Panel: No results for input(s): VITAMINB12, FOLATE, FERRITIN, TIBC, IRON, RETICCTPCT in the last 72 hours.  --------------------------------------------------------------------------------------------------------------- Urine analysis:    Component Value Date/Time   COLORURINE YELLOW 10/29/2015 1655   APPEARANCEUR Clear 08/21/2018 1029   LABSPEC 1.015 10/29/2015 1655   PHURINE 7.0 10/29/2015 1655   GLUCOSEU Negative 08/21/2018 1029   HGBUR NEGATIVE 10/29/2015 1655   BILIRUBINUR Negative 08/21/2018 [place] 10/29/2015 1655   PROTEINUR 1+ (A) 08/21/2018 1029   PROTEINUR NEGATIVE 10/29/2015 1655   NITRITE Negative 08/21/2018 1029   NITRITE NEGATIVE 10/29/2015 1655  LEUKOCYTESUR Negative 08/21/2018 1029       Imaging Results:    DG Chest Portable 1 View  Result Date: 04/20/2021 CLINICAL DATA:  Dyspnea, shortness of breath EXAM: PORTABLE CHEST 1 VIEW COMPARISON:  04/17/2021 FINDINGS: Lower lung volumes. Likely unchanged cardiac and mediastinal contours when accounting for differences in lung volumes and projection. Likely small left pleural effusion. No acute osseous abnormality. IMPRESSION: Suspect small left pleural effusion, however this could also represent prominent epicardial fat seen on the 03/20/2021 CT in the setting of lower lung volumes. Electronically Signed   By: Merilyn Baba M.D.   On: 04/20/2021 23:33       Assessment & Plan:    Active Problems:   GERD   COPD with acute exacerbation (HCC)   Acute respiratory failure with hypoxia (HCC)   Hypokalemia   Hyperglycemia   Acute hypoxic respiratory failure Secondary to COPD exacerbation O2 sats in the 80s, corrected with 2 L nasal cannula Continue oxygen supplementation as needed, weaning off as tolerated Continue treatment below COPD exacerbation Continue systemic steroids, scheduled breathing treatments, as needed albuterol Monitor on telemetry Continue montelukast-Home medication Hypokalemia Replace and recheck Hyperglycemia Patient currently on steroids while in the hospital, sliding scale coverage A. Fib Continue metoprolol, diltiazem, Eliquis GERD Continue PPI HLD Continue statin   DVT Prophylaxis-   Eliquis- SCDs   AM Labs Ordered, also please review Full Orders  Family Communication: No family at bedside  Code Status: Full  Admission status: Observation  Time spent in minutes : [place] DO

## 2021-04-22 DIAGNOSIS — J9601 Acute respiratory failure with hypoxia: Secondary | ICD-10-CM | POA: Diagnosis not present

## 2021-04-22 DIAGNOSIS — J441 Chronic obstructive pulmonary disease with (acute) exacerbation: Secondary | ICD-10-CM | POA: Diagnosis not present

## 2021-04-22 DIAGNOSIS — R739 Hyperglycemia, unspecified: Secondary | ICD-10-CM | POA: Diagnosis not present

## 2021-04-22 DIAGNOSIS — E876 Hypokalemia: Secondary | ICD-10-CM | POA: Diagnosis not present

## 2021-04-22 DIAGNOSIS — K219 Gastro-esophageal reflux disease without esophagitis: Secondary | ICD-10-CM | POA: Diagnosis not present

## 2021-04-22 LAB — GLUCOSE, CAPILLARY
Glucose-Capillary: 166 mg/dL — ABNORMAL HIGH (ref 70–99)
Glucose-Capillary: 168 mg/dL — ABNORMAL HIGH (ref 70–99)
Glucose-Capillary: 142 mg/dL — ABNORMAL HIGH (ref 70–99)
Glucose-Capillary: 133 mg/dL — ABNORMAL HIGH (ref 70–99)

## 2021-04-22 NOTE — Plan of Care (Signed)
  Problem: Acute Rehab PT Goals(only PT should resolve) Goal: Patient Will Transfer Sit To/From Stand Outcome: Progressing Flowsheets (Taken 04/22/2021 1302) Patient will transfer sit to/from stand: with modified independence Goal: Pt Will Ambulate Outcome: Progressing Flowsheets (Taken 04/22/2021 1302) Pt will Ambulate:  > 125 feet  with modified independence  with rolling walker Goal: Pt/caregiver will Perform Home Exercise Program Outcome: Progressing Flowsheets (Taken 04/22/2021 1302) Pt/caregiver will Perform Home Exercise Program:  For improved balance  Independently   Tori Tateanna Bach PT, DPT 04/22/21, 1:03 PM

## 2021-04-22 NOTE — Plan of Care (Signed)

## 2021-04-22 NOTE — Progress Notes (Signed)
PROGRESS NOTE   Krystal [name]  [id number] DOB: [birth year] DOA: 04/20/2021 PCP: Dettinger, Fransisca Kaufmann, MD   Chief Complaint  Patient presents with   Shortness of Breath   Level of care: Telemetry  Brief Admission History:  80 y.o. female, with history of asthma, brain tumor, breast cancer, COPD, hyperlipidemia, macular degeneration, atrial fibrillation, and more presents the ED with a chief complaint of dyspnea.  Patient expresses that she is tired and had just fallen asleep at the time of my interview.  She is count of resistant to answer some questions, and for that reason history may be slightly limited.  Patient reports that she has had a rattling in her chest and dyspnea.  Its been worse today but she has had over 1 month.  She said what was worse about a today was the rattling.  Her dyspnea and rattling are worse with exertion.  She has had palpitations associated.  She reports that she did take her Eliquis and diltiazem this morning but did not take her metoprolol today.  She is felt generally fatigued.  She admits to wheezing and reports that she has tried her albuterol inhaler without relief.  She has had a dry cough.  She denies fevers.  Patient reports 2-3 pillow orthopnea at home.  She has no oxygen requirement at baseline.  Patient has no other complaints at this time.  Patient does not smoke, does not drink alcohol, does not use illicit drugs.  Patient is vaccinated for COVID.  Patient is full code.  Assessment & Plan:   Active Problems:   GERD   COPD with acute exacerbation (HCC)   Acute respiratory failure with hypoxia (HCC)   Hypokalemia   Hyperglycemia  Acute respiratory failure with hypoxia - secondary to COPD exacerbation - Pt continues to improve with aggressive treatments.  As she improves we are working to de-escalate care. She is NOT at baseline at this time.  She still has an oxygen requirement and is quite symptomatic and not speaking full sentences but much  improved from admission. Anticipate another 24 hours of inpatient care and hopeful to discharge home in next 1-2 days.   Pt will need a home O2 eval screen prior to discharge home.    Hypokalemia - repleted.   Steroid induced hyperglycemia - controlled with current measures, Follow.    Chronic atrial fibrillation - Heart rate stable on metoprolol, diltiazem and fully anticoagulated on apixaban.   GERD - Pt remains on protonix for GI protection.    Hyperlipidemia - resumed home rosuvastatin.    DVT prophylaxis: apixaban/SCDs Code Status: Full  Family Communication: telephone call to DIL 9/19 Disposition: anticipate DC home 9/20  Status is: Observation  The patient remains OBS appropriate and will d/c before 2 midnights.  Dispo: The patient is from: Home              Anticipated d/c is to: Home              Patient currently is not medically stable to d/c.   Difficult to place patient No  Consultants:  N/a  Procedures:  N/a   Antimicrobials:     Subjective: Pt reports congested cough.  Pt still speaking in short sentences.   Objective: Vitals:   04/22/21 0549 04/22/21 0729 04/22/21 1057 04/22/21 1417  BP: 111/63   111/63  Pulse: 87   70  Resp: 19   18  Temp: 97.8 F (36.6 C)   (!) 97.5 F (36.4  C)  TempSrc: Oral   Oral  SpO2: 96% 96% 97% 97%  Weight:      Height:        Intake/Output Summary (Last 24 hours) at 04/22/2021 1555 Last data filed at 04/22/2021 1300 Gross per 24 hour  Intake 840 ml  Output --  Net 840 ml   Filed Weights   04/20/21 2201 04/21/21 1700 04/21/21 2115  Weight: 79.8 kg 81.6 kg 81.3 kg    Examination:  General exam: Appears calm and comfortable.  Speaking in short sentences.    Respiratory system: improved rales, better air movement. Shallow breathing at times.  Cardiovascular system: normal S1 & S2 heard. Irregularly irregular.  No JVD, murmurs, rubs, gallops or clicks. No pedal edema. Gastrointestinal system: Abdomen is  nondistended, soft and nontender. No organomegaly or masses felt. Normal bowel sounds heard. Central nervous system: Alert and oriented. No focal neurological deficits. Extremities: Symmetric 5 x 5 power. Skin: No rashes, lesions or ulcers Psychiatry: Judgement and insight appear normal. Mood & affect appropriate.   Data Reviewed: I have personally reviewed following labs and imaging studies  CBC: Recent Labs  Lab 04/17/21 1459 04/20/21 2200 04/21/21 0602  WBC 5.8 9.2 5.3  NEUTROABS 3.2 3.7  --   HGB 13.4 13.6 13.3  HCT 43.0 43.6 42.7  MCV 95.3 95.0 93.6  PLT 143* 160 142*    Basic Metabolic Panel: Recent Labs  Lab 04/17/21 1459 04/20/21 2200 04/21/21 0602  NA 140 137 139  K 3.7 3.3* 3.4*  CL 105 104 103  CO2 '28 26 23  '$ GLUCOSE 87 176* 193*  BUN '18 11 15  '$ CREATININE 0.67 0.79 0.93  CALCIUM 8.8* 8.5* 9.0  MG 2.1  --  2.1    GFR: Estimated Creatinine Clearance: 47.9 mL/min (by C-G formula based on SCr of 0.93 mg/dL).  Liver Function Tests: Recent Labs  Lab 04/20/21 2200 04/21/21 0602  AST 36 39  ALT 29 <5  ALKPHOS 47 47  BILITOT 0.4 0.5  PROT 6.6 6.7  ALBUMIN 3.8 3.8    CBG: Recent Labs  Lab 04/21/21 1116 04/21/21 1605 04/21/21 2059 04/22/21 0716 04/22/21 1112  GLUCAP 137* 150* 260* 166* 168*    Recent Results (from the past 240 hour(s))  Resp Panel by RT-PCR (Flu A&B, Covid) Nasopharyngeal Swab     Status: None   Collection Time: 04/17/21  5:00 PM   Specimen: Nasopharyngeal Swab; Nasopharyngeal(NP) swabs in vial transport medium  Result Value Ref Range Status   SARS Coronavirus 2 by RT PCR NEGATIVE NEGATIVE Final    Comment: (NOTE) SARS-CoV-2 target nucleic acids are NOT DETECTED.  The SARS-CoV-2 RNA is generally detectable in upper respiratory specimens during the acute phase of infection. The lowest concentration of SARS-CoV-2 viral copies this assay can detect is 138 copies/mL. A negative result does not preclude SARS-Cov-2 infection and  should not be used as the sole basis for treatment or other patient management decisions. A negative result may occur with  improper specimen collection/handling, submission of specimen other than nasopharyngeal swab, presence of viral mutation(s) within the areas targeted by this assay, and inadequate number of viral copies(<138 copies/mL). A negative result must be combined with clinical observations, patient history, and epidemiological information. The expected result is Negative.  Fact Sheet for Patients:  EntrepreneurPulse.com.au  Fact Sheet for Healthcare Providers:  IncredibleEmployment.be  This test is no t yet approved or cleared by the [place] FDA and  has been authorized for detection and/or diagnosis  of SARS-CoV-2 by FDA under an Emergency Use Authorization (EUA). This EUA will remain  in effect (meaning this test can be used) for the duration of the COVID-19 declaration under Section 564(b)(1) of the Act, 21 U.S.C.section 360bbb-3(b)(1), unless the authorization is terminated  or revoked sooner.       Influenza A by PCR NEGATIVE NEGATIVE Final   Influenza B by PCR NEGATIVE NEGATIVE Final    Comment: (NOTE) The Xpert Xpress SARS-CoV-2/FLU/RSV plus assay is intended as an aid in the diagnosis of influenza from Nasopharyngeal swab specimens and should not be used as a sole basis for treatment. Nasal washings and aspirates are unacceptable for Xpert Xpress SARS-CoV-2/FLU/RSV testing.  Fact Sheet for Patients: EntrepreneurPulse.com.au  Fact Sheet for Healthcare Providers: IncredibleEmployment.be  This test is not yet approved or cleared by the [place] FDA and has been authorized for detection and/or diagnosis of SARS-CoV-2 by FDA under an Emergency Use Authorization (EUA). This EUA will remain in effect (meaning this test can be used) for the duration of the COVID-19 declaration  under Section 564(b)(1) of the Act, 21 U.S.C. section 360bbb-3(b)(1), unless the authorization is terminated or revoked.  Performed at [facility], [address] [place],  [address]   Resp Panel by RT-PCR (Flu A&B, Covid) Nasopharyngeal Swab     Status: None   Collection Time: 04/20/21 10:15 PM   Specimen: Nasopharyngeal Swab; Nasopharyngeal(NP) swabs in vial transport medium  Result Value Ref Range Status   SARS Coronavirus 2 by RT PCR NEGATIVE NEGATIVE Final    Comment: (NOTE) SARS-CoV-2 target nucleic acids are NOT DETECTED.  The SARS-CoV-2 RNA is generally detectable in upper respiratory specimens during the acute phase of infection. The lowest concentration of SARS-CoV-2 viral copies this assay can detect is 138 copies/mL. A negative result does not preclude SARS-Cov-2 infection and should not be used as the sole basis for treatment or other patient management decisions. A negative result may occur with  improper specimen collection/handling, submission of specimen other than nasopharyngeal swab, presence of viral mutation(s) within the areas targeted by this assay, and inadequate number of viral copies(<138 copies/mL). A negative result must be combined with clinical observations, patient history, and epidemiological information. The expected result is Negative.  Fact Sheet for Patients:  EntrepreneurPulse.com.au  Fact Sheet for Healthcare Providers:  IncredibleEmployment.be  This test is no t yet approved or cleared by the [place] FDA and  has been authorized for detection and/or diagnosis of SARS-CoV-2 by FDA under an Emergency Use Authorization (EUA). This EUA will remain  in effect (meaning this test can be used) for the duration of the COVID-19 declaration under Section 564(b)(1) of the Act, 21 U.S.C.section 360bbb-3(b)(1), unless the authorization is terminated  or revoked sooner.       Influenza A by PCR  NEGATIVE NEGATIVE Final   Influenza B by PCR NEGATIVE NEGATIVE Final    Comment: (NOTE) The Xpert Xpress SARS-CoV-2/FLU/RSV plus assay is intended as an aid in the diagnosis of influenza from Nasopharyngeal swab specimens and should not be used as a sole basis for treatment. Nasal washings and aspirates are unacceptable for Xpert Xpress SARS-CoV-2/FLU/RSV testing.  Fact Sheet for Patients: EntrepreneurPulse.com.au  Fact Sheet for Healthcare Providers: IncredibleEmployment.be  This test is not yet approved or cleared by the [place] FDA and has been authorized for detection and/or diagnosis of SARS-CoV-2 by FDA under an Emergency Use Authorization (EUA). This EUA will remain in effect (meaning this test can be used) for the  duration of the COVID-19 declaration under Section 564(b)(1) of the Act, 21 U.S.C. section 360bbb-3(b)(1), unless the authorization is terminated or revoked.  Performed at [facility], [address], [place], [place] [address]      Radiology Studies: DG Chest Portable 1 View  Result Date: 04/20/2021 CLINICAL DATA:  Dyspnea, shortness of breath EXAM: PORTABLE CHEST 1 VIEW COMPARISON:  04/17/2021 FINDINGS: Lower lung volumes. Likely unchanged cardiac and mediastinal contours when accounting for differences in lung volumes and projection. Likely small left pleural effusion. No acute osseous abnormality. IMPRESSION: Suspect small left pleural effusion, however this could also represent prominent epicardial fat seen on the 03/20/2021 CT in the setting of lower lung volumes. Electronically Signed   By: Merilyn Baba M.D.   On: 04/20/2021 23:33    Scheduled Meds:  apixaban  5 mg Oral BID   budesonide (PULMICORT) nebulizer solution  0.5 mg Nebulization BID   diltiazem  300 mg Oral Daily   insulin aspart  0-15 Units Subcutaneous TID WC   insulin aspart  0-5 Units Subcutaneous QHS   ipratropium-albuterol  3 mL Nebulization BID    loratadine  10 mg Oral Daily   metoprolol tartrate  25 mg Oral Daily   montelukast  10 mg Oral QHS   pantoprazole  40 mg Oral Daily   predniSONE  40 mg Oral Q breakfast   rosuvastatin  20 mg Oral Daily   Continuous Infusions:   LOS: 0 days   Time spent: 35 minutes   Vada Swift Wynetta Emery, MD How to contact the [facility] Attending or Consulting provider [place] or covering provider during after hours [place], for this patient?  Check the care team in [facility] and look for a) attending/consulting [facility] provider listed and b) the [facility] team listed Log into [email or web address] and use [place]'s universal password to access. If you do not have the password, please contact the hospital operator. Locate the [facility] provider you are looking for under [facility] and page to a number that you can be directly reached. If you still have difficulty reaching the provider, please page the [facility] (Director on Call) for the Hospitalists listed on amion for assistance.  04/22/2021, 3:55 PM

## 2021-04-22 NOTE — Evaluation (Signed)
Physical Therapy Evaluation Patient Details Name: Krystal [name] MRN: [id number] DOB: 04/23/1941 Today's Date: 04/22/2021  History of Present Illness  Krystal [name] is a 80 y.o. female who presents with c/o dyspnea. PMH: asthma, brain tumor, breast cancer, COPD, hyperlipidemia, macular degeneration, atrial fibrillation, L  heart cath 03/21/21   Clinical Impression  Pt admitted with above diagnosis. Pt independent at baseline using rollator in the home, occasional furniture walking, denies recent falls, and son or daughter in law perform all household chores and transportation. Pt currently requiring supv with transfers and ambulation using RW, requires 3 standing rest breaks due to fatigue, on RA with SpO2 97%. Pt remains on RA at EOS with SpO2 97%- RN aware. Pt well educated regarding PT and highly motivated to return home with family support. Pt currently with functional limitations due to the deficits listed below (see PT Problem List). Pt will benefit from skilled PT to increase their independence and safety with mobility to allow discharge to the venue listed below.          Recommendations for follow up therapy are one component of a multi-disciplinary discharge planning process, led by the attending physician.  Recommendations may be updated based on patient status, additional functional criteria and insurance authorization.  Follow Up Recommendations No PT follow up    Equipment Recommendations  None recommended by PT    Recommendations for Other Services       Precautions / Restrictions Precautions Precautions: Fall Restrictions Weight Bearing Restrictions: No      Mobility  Bed Mobility Overal bed mobility: Independent  General bed mobility comments: return to supine    Transfers Overall transfer level: Needs assistance Equipment used: Rolling walker (2 wheeled) Transfers: Sit to/from Stand Sit to Stand: Supervision  General transfer comment: BUE assisting to  power to stand, good steadiness  Ambulation/Gait Ambulation/Gait assistance: Supervision Gait Distance (Feet): 120 Feet Assistive device: Rolling walker (2 wheeled) Gait Pattern/deviations: Step-through pattern;Decreased stride length Gait velocity: decreased   General Gait Details: step through pattern, initial VCs to maintain body closer to RW, 3 standing rest breaks with 2-3/4 dyspnea, on RA with SpO2 97%  Stairs            Wheelchair Mobility    Modified Rankin (Stroke Patients Only)       Balance Overall balance assessment: Mild deficits observed, not formally tested       Pertinent Vitals/Pain Pain Assessment: No/denies pain ("just when I cough")    Home Living Family/patient expects to be discharged to:: Private residence Living Arrangements: Children Available Help at Discharge: Family;Available 24 hours/day Type of Home: Mobile home Home Access: Ramped entrance     Home Layout: One level Home Equipment: [place] - 2 wheels;Cane - single point;Bedside commode;Shower seat;Walker - 4 wheels Additional Comments: son home 24/7 and daughter-in-law works 2nd shift    Prior Function Level of Independence: Independent with assistive device(s)   Gait / Transfers Assistance Needed: pt reports ambulates household distances with rollator, ind with transfers, denies recent falls  ADL's / Homemaking Assistance Needed: pt reports independent with self care tasks, son/daughter in law complete household chores and provide transportation        Hand Dominance   Dominant Hand: Left    Extremity/Trunk Assessment   Upper Extremity Assessment Upper Extremity Assessment: Overall WFL for tasks assessed    Lower Extremity Assessment Lower Extremity Assessment: Overall WFL for tasks assessed (AROM WNL, strength 4/5 throughout, symmetrical, denies numbness/tingling)    Cervical /  Trunk Assessment Cervical / Trunk Assessment: Normal  Communication   Communication: No  difficulties  Cognition Arousal/Alertness: Awake/alert Behavior During Therapy: WFL for tasks assessed/performed Overall Cognitive Status: Within Functional Limits for tasks assessed       General Comments General comments (skin integrity, edema, etc.): 99% on 2.5 L O2, 97% on RA- left on RA and RN notified    Exercises     Assessment/Plan    PT Assessment Patient needs continued PT services  PT Problem List Decreased activity tolerance;Decreased balance;Cardiopulmonary status limiting activity       PT Treatment Interventions DME instruction;Gait training;Functional mobility training;Therapeutic activities;Therapeutic exercise;Balance training;Patient/family education    PT Goals (Current goals can be found in the Care Plan section)  Acute Rehab PT Goals Patient Stated Goal: return home with son and daughter in law PT Goal Formulation: With patient Time For Goal Achievement: 05/06/21 Potential to Achieve Goals: Good    Frequency Min 3X/week   Barriers to discharge        Co-evaluation               AM-PAC PT "6 Clicks" Mobility  Outcome Measure Help needed turning from your back to your side while in a flat bed without using bedrails?: None Help needed moving from lying on your back to sitting on the side of a flat bed without using bedrails?: None Help needed moving to and from a bed to a chair (including a wheelchair)?: A Little Help needed standing up from a chair using your arms (e.g., wheelchair or bedside chair)?: A Little Help needed to walk in hospital room?: A Little Help needed climbing 3-5 steps with a railing? : A Little 6 Click Score: 20    End of Session   Activity Tolerance: Patient tolerated treatment well Patient left: in bed;with call bell/phone within reach Nurse Communication: Mobility status;Other (comment) (SpO2 while ambulating on RA and left on RA at EOS) PT Visit Diagnosis: Other abnormalities of gait and mobility (R26.89)    Time:  [id number] PT Time Calculation (min) (ACUTE ONLY): 26 min   Charges:   PT Evaluation $PT Eval Low Complexity: 1 Low PT Treatments $Gait Training: 8-22 mins         Tori Shir Bergman PT, DPT 04/22/21, 1:00 PM

## 2021-04-23 DIAGNOSIS — Z83438 Family history of other disorder of lipoprotein metabolism and other lipidemia: Secondary | ICD-10-CM | POA: Diagnosis not present

## 2021-04-23 DIAGNOSIS — H548 Legal blindness, as defined in USA: Secondary | ICD-10-CM | POA: Diagnosis present

## 2021-04-23 DIAGNOSIS — J441 Chronic obstructive pulmonary disease with (acute) exacerbation: Secondary | ICD-10-CM | POA: Diagnosis not present

## 2021-04-23 DIAGNOSIS — T380X5A Adverse effect of glucocorticoids and synthetic analogues, initial encounter: Secondary | ICD-10-CM | POA: Diagnosis present

## 2021-04-23 DIAGNOSIS — H3530 Unspecified macular degeneration: Secondary | ICD-10-CM | POA: Diagnosis not present

## 2021-04-23 DIAGNOSIS — Z9011 Acquired absence of right breast and nipple: Secondary | ICD-10-CM | POA: Diagnosis not present

## 2021-04-23 DIAGNOSIS — Z8262 Family history of osteoporosis: Secondary | ICD-10-CM | POA: Diagnosis not present

## 2021-04-23 DIAGNOSIS — R600 Localized edema: Secondary | ICD-10-CM | POA: Diagnosis present

## 2021-04-23 DIAGNOSIS — M810 Age-related osteoporosis without current pathological fracture: Secondary | ICD-10-CM | POA: Diagnosis not present

## 2021-04-23 DIAGNOSIS — Z20822 Contact with and (suspected) exposure to covid-19: Secondary | ICD-10-CM | POA: Diagnosis not present

## 2021-04-23 DIAGNOSIS — R739 Hyperglycemia, unspecified: Secondary | ICD-10-CM | POA: Diagnosis present

## 2021-04-23 DIAGNOSIS — Z86010 Personal history of colonic polyps: Secondary | ICD-10-CM | POA: Diagnosis not present

## 2021-04-23 DIAGNOSIS — I2510 Atherosclerotic heart disease of native coronary artery without angina pectoris: Secondary | ICD-10-CM | POA: Diagnosis present

## 2021-04-23 DIAGNOSIS — I700 Atherosclerosis of aorta: Secondary | ICD-10-CM | POA: Diagnosis not present

## 2021-04-23 DIAGNOSIS — K219 Gastro-esophageal reflux disease without esophagitis: Secondary | ICD-10-CM | POA: Diagnosis present

## 2021-04-23 DIAGNOSIS — J9601 Acute respiratory failure with hypoxia: Secondary | ICD-10-CM | POA: Diagnosis not present

## 2021-04-23 DIAGNOSIS — Z9841 Cataract extraction status, right eye: Secondary | ICD-10-CM | POA: Diagnosis not present

## 2021-04-23 DIAGNOSIS — I352 Nonrheumatic aortic (valve) stenosis with insufficiency: Secondary | ICD-10-CM | POA: Diagnosis not present

## 2021-04-23 DIAGNOSIS — Z853 Personal history of malignant neoplasm of breast: Secondary | ICD-10-CM | POA: Diagnosis not present

## 2021-04-23 DIAGNOSIS — E876 Hypokalemia: Secondary | ICD-10-CM | POA: Diagnosis not present

## 2021-04-23 DIAGNOSIS — J449 Chronic obstructive pulmonary disease, unspecified: Secondary | ICD-10-CM | POA: Diagnosis present

## 2021-04-23 DIAGNOSIS — I482 Chronic atrial fibrillation, unspecified: Secondary | ICD-10-CM | POA: Diagnosis not present

## 2021-04-23 DIAGNOSIS — I480 Paroxysmal atrial fibrillation: Secondary | ICD-10-CM | POA: Diagnosis not present

## 2021-04-23 DIAGNOSIS — R002 Palpitations: Secondary | ICD-10-CM | POA: Diagnosis present

## 2021-04-23 DIAGNOSIS — E785 Hyperlipidemia, unspecified: Secondary | ICD-10-CM | POA: Diagnosis not present

## 2021-04-23 LAB — BASIC METABOLIC PANEL
Anion gap: 4 — ABNORMAL LOW (ref 5–15)
BUN: 26 mg/dL — ABNORMAL HIGH (ref 8–23)
CO2: 29 mmol/L (ref 22–32)
Calcium: 8.7 mg/dL — ABNORMAL LOW (ref 8.9–10.3)
Chloride: 100 mmol/L (ref 98–111)
Creatinine, Ser: 0.64 mg/dL (ref 0.44–1.00)
GFR, Estimated: >60 mL/min (ref >60)
Glucose, Bld: 130 mg/dL — ABNORMAL HIGH (ref 70–99)
Potassium: 5.2 mmol/L — ABNORMAL HIGH (ref 3.5–5.1)
Sodium: 133 mmol/L — ABNORMAL LOW (ref 135–145)

## 2021-04-23 LAB — GLUCOSE, CAPILLARY
Glucose-Capillary: 113 mg/dL — ABNORMAL HIGH (ref 70–99)
Glucose-Capillary: 134 mg/dL — ABNORMAL HIGH (ref 70–99)
Glucose-Capillary: 159 mg/dL — ABNORMAL HIGH (ref 70–99)
Glucose-Capillary: 142 mg/dL — ABNORMAL HIGH (ref 70–99)

## 2021-04-23 LAB — TROPONIN I (HIGH SENSITIVITY)
Troponin I (High Sensitivity): 6 ng/L (ref <18)
Troponin I (High Sensitivity): 5 ng/L (ref <18)

## 2021-04-23 MED ORDER — METHYLPREDNISOLONE SODIUM SUCC 125 MG IJ SOLR
120.0000 mg | Freq: Every day | INTRAMUSCULAR | Status: DC
  Administered 2021-04-23 – 2021-04-24 (×2): 120 mg via INTRAVENOUS
  Filled 2021-04-23 (×2): qty 2

## 2021-04-23 MED ORDER — SODIUM CHLORIDE 0.9 % IV SOLN: INTRAVENOUS | Status: AC

## 2021-04-23 MED ORDER — IPRATROPIUM-ALBUTEROL 0.5-2.5 (3) MG/3ML IN SOLN
3.0000 mL | Freq: Three times a day (TID) | RESPIRATORY_TRACT | Status: DC
  Administered 2021-04-24: 3 mL via RESPIRATORY_TRACT
  Filled 2021-04-23: qty 3

## 2021-04-23 MED ORDER — IPRATROPIUM-ALBUTEROL 0.5-2.5 (3) MG/3ML IN SOLN
3.0000 mL | Freq: Four times a day (QID) | RESPIRATORY_TRACT | Status: DC
  Administered 2021-04-23: 3 mL via RESPIRATORY_TRACT
  Filled 2021-04-23: qty 3

## 2021-04-23 MED ORDER — DIAZEPAM 5 MG PO TABS
2.5000 mg | ORAL_TABLET | Freq: Every evening | ORAL | Status: DC | PRN
  Administered 2021-04-23: 2.5 mg via ORAL
  Filled 2021-04-23: qty 1

## 2021-04-23 NOTE — Progress Notes (Signed)
PROGRESS NOTE  Krystal [name] JJO:[id number] DOB: 03-Mar-1941 DOA: 04/20/2021 PCP: Dettinger, Fransisca Kaufmann, MD  Brief History:  80 y.o. female, with history of asthma, brain tumor, breast cancer, COPD, hyperlipidemia, macular degeneration, atrial fibrillation, and more presents the ED with a chief complaint of dyspnea.  Patient expresses that she is tired and had just fallen asleep at the time of my interview.  She is count of resistant to answer some questions, and for that reason history may be slightly limited.  Patient reports that she has had a rattling in her chest and dyspnea.  Its been worse today but she has had over 1 month.  She said what was worse about a today was the rattling.  Her dyspnea and rattling are worse with exertion.  She has had palpitations associated.  She reports that she did take her Eliquis and diltiazem this morning but did not take her metoprolol today.  She is felt generally fatigued.  She admits to wheezing and reports that she has tried her albuterol inhaler without relief.  She has had a dry cough.  She denies fevers.  Patient reports 2-3 pillow orthopnea at home.  She has no oxygen requirement at baseline.  Patient has no other complaints at this time.  Patient does not smoke, does not drink alcohol, does not use illicit drugs.  Patient is vaccinated for COVID.  Patient is full code.    Assessment/Plan: Acute respiratory failure with hypoxia - secondary to COPD exacerbation  -wean to RA -continue pulmicort -restart IV solumedrol--continued wheeze and sob -increase duoneb to q 6   Hypokalemia  -repleted.  -now hyperkalemic>>start IVF x 12 hours -am BMP   Steroid induced hyperglycemia  - controlled with current measures,  -04/21/21 A1C--6.0    Paroxysmal atrial fibrillation  -Heart rate stable on metoprolol, diltiazem  -anticoagulated on apixaban.  -currently in sinus   GERD - Pt remains on protonix for GI protection.     Hyperlipidemia - -  resumed home rosuvastatin.    valvular heart disease -aortic stenosis/aortic insufficiency-she will need follow-up echocardiograms in the future.  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 -continue statin, apixaban -cycle troponins, check ECG--complains chest discomfort am 9/20        Status is: inpatient  The patient will require care spanning > 2 midnights and should be moved to inpatient because: IV treatments appropriate due to intensity of illness or inability to take PO  Dispo: The patient is from: Home              Anticipated d/c is to: Home              Patient currently is not medically stable to d/c.   Difficult to place patient No        Family Communication:   no Family at bedside  Consultants:  none  Code Status:  FULL  DVT Prophylaxis:  apixaban   Procedures: As Listed in Progress Note Above  Antibiotics: None        Subjective: Pt asleep when I walked into room.  Arouses to voice easily.  States she is still sob.  Denies n/v/d.  Has occasional chest discomfort  Objective: Vitals:   04/22/21 2139 04/23/21 0410 04/23/21 0500 04/23/21 0941  BP: (!) 111/58 112/62    Pulse: 81 70    Resp: 20 19    Temp: 97.7 F (36.5 C) 98 F (36.7 C)    TempSrc: Oral  SpO2: 96% 98%  97%  Weight:   81.6 kg   Height:        Intake/Output Summary (Last 24 hours) at 04/23/2021 0958 Last data filed at 04/23/2021 0920 Gross per 24 hour  Intake 480 ml  Output --  Net 480 ml   Weight change: 0 kg Exam:  General:  Pt is alert, follows commands appropriately, not in acute distress HEENT: No icterus, No thrush, No neck mass, [place]/AT Cardiovascular: RRR, S1/S2, no rubs, no gallops Respiratory: bilateral exp wheeze Abdomen: Soft/+BS, non tender, non distended, no guarding Extremities: Nonpitting edema, No lymphangitis, No petechiae, No rashes, no synovitis   Data Reviewed: I have personally reviewed following labs and imaging studies Basic Metabolic  Panel: Recent Labs  Lab 04/17/21 1459 04/20/21 2200 04/21/21 0602 04/23/21 0521  NA 140 137 139 133*  K 3.7 3.3* 3.4* 5.2*  CL 105 104 103 100  CO2 28 26 23 29   GLUCOSE 87 176* 193* 130*  BUN 18 11 15  26*  CREATININE 0.67 0.79 0.93 0.64  CALCIUM 8.8* 8.5* 9.0 8.7*  MG 2.1  --  2.1  --    Liver Function Tests: Recent Labs  Lab 04/20/21 2200 04/21/21 0602  AST 36 39  ALT 29 <5  ALKPHOS 47 47  BILITOT 0.4 0.5  PROT 6.6 6.7  ALBUMIN 3.8 3.8   No results for input(s): LIPASE, AMYLASE in the last 168 hours. No results for input(s): AMMONIA in the last 168 hours. Coagulation Profile: No results for input(s): INR, PROTIME in the last 168 hours. CBC: Recent Labs  Lab 04/17/21 1459 04/20/21 2200 04/21/21 0602  WBC 5.8 9.2 5.3  NEUTROABS 3.2 3.7  --   HGB 13.4 13.6 13.3  HCT 43.0 43.6 42.7  MCV 95.3 95.0 93.6  PLT 143* 160 142*   Cardiac Enzymes: No results for input(s): CKTOTAL, CKMB, CKMBINDEX, TROPONINI in the last 168 hours. BNP: Invalid input(s): POCBNP CBG: Recent Labs  Lab 04/22/21 0716 04/22/21 1112 04/22/21 1656 04/22/21 2135 04/23/21 0753  GLUCAP 166* 168* 142* 133* 113*   HbA1C: Recent Labs    04/21/21 0602  HGBA1C 6.0*   Urine analysis:    Component Value Date/Time   COLORURINE YELLOW 10/29/2015 1655   APPEARANCEUR Clear 08/21/2018 1029   LABSPEC 1.015 10/29/2015 1655   PHURINE 7.0 10/29/2015 1655   GLUCOSEU Negative 08/21/2018 1029   HGBUR NEGATIVE 10/29/2015 1655   BILIRUBINUR Negative 08/21/2018 1029   KETONESUR NEGATIVE 10/29/2015 1655   PROTEINUR 1+ (A) 08/21/2018 1029   PROTEINUR NEGATIVE 10/29/2015 1655   NITRITE Negative 08/21/2018 1029   NITRITE NEGATIVE 10/29/2015 1655   LEUKOCYTESUR Negative 08/21/2018 1029   Sepsis Labs: @LABRCNTIP (procalcitonin:4,lacticidven:4) ) Recent Results (from the past 240 hour(s))  Resp Panel by RT-PCR (Flu A&B, Covid) Nasopharyngeal Swab     Status: None   Collection Time: 04/17/21  5:00 PM    Specimen: Nasopharyngeal Swab; Nasopharyngeal(NP) swabs in vial transport medium  Result Value Ref Range Status   SARS Coronavirus 2 by RT PCR NEGATIVE NEGATIVE Final    Comment: (NOTE) SARS-CoV-2 target nucleic acids are NOT DETECTED.  The SARS-CoV-2 RNA is generally detectable in upper respiratory specimens during the acute phase of infection. The lowest concentration of SARS-CoV-2 viral copies this assay can detect is 138 copies/mL. A negative result does not preclude SARS-Cov-2 infection and should not be used as the sole basis for treatment or other patient management decisions. A negative result may occur with  improper specimen collection/handling,  submission of specimen other than nasopharyngeal swab, presence of viral mutation(s) within the areas targeted by this assay, and inadequate number of viral copies(<138 copies/mL). A negative result must be combined with clinical observations, patient history, and epidemiological information. The expected result is Negative.  Fact Sheet for Patients:  EntrepreneurPulse.com.au  Fact Sheet for Healthcare Providers:  IncredibleEmployment.be  This test is no t yet approved or cleared by the [place] FDA and  has been authorized for detection and/or diagnosis of SARS-CoV-2 by FDA under an Emergency Use Authorization (EUA). This EUA will remain  in effect (meaning this test can be used) for the duration of the COVID-19 declaration under Section 564(b)(1) of the Act, 21 U.S.C.section 360bbb-3(b)(1), unless the authorization is terminated  or revoked sooner.       Influenza A by PCR NEGATIVE NEGATIVE Final   Influenza B by PCR NEGATIVE NEGATIVE Final    Comment: (NOTE) The Xpert Xpress SARS-CoV-2/FLU/RSV plus assay is intended as an aid in the diagnosis of influenza from Nasopharyngeal swab specimens and should not be used as a sole basis for treatment. Nasal washings and aspirates are  unacceptable for Xpert Xpress SARS-CoV-2/FLU/RSV testing.  Fact Sheet for Patients: EntrepreneurPulse.com.au  Fact Sheet for Healthcare Providers: IncredibleEmployment.be  This test is not yet approved or cleared by the [place] FDA and has been authorized for detection and/or diagnosis of SARS-CoV-2 by FDA under an Emergency Use Authorization (EUA). This EUA will remain in effect (meaning this test can be used) for the duration of the COVID-19 declaration under Section 564(b)(1) of the Act, 21 U.S.C. section 360bbb-3(b)(1), unless the authorization is terminated or revoked.  Performed at [facility], [address], [place], [place] [address]   Resp Panel by RT-PCR (Flu A&B, Covid) Nasopharyngeal Swab     Status: None   Collection Time: 04/20/21 10:15 PM   Specimen: Nasopharyngeal Swab; Nasopharyngeal(NP) swabs in vial transport medium  Result Value Ref Range Status   SARS Coronavirus 2 by RT PCR NEGATIVE NEGATIVE Final    Comment: (NOTE) SARS-CoV-2 target nucleic acids are NOT DETECTED.  The SARS-CoV-2 RNA is generally detectable in upper respiratory specimens during the acute phase of infection. The lowest concentration of SARS-CoV-2 viral copies this assay can detect is 138 copies/mL. A negative result does not preclude SARS-Cov-2 infection and should not be used as the sole basis for treatment or other patient management decisions. A negative result may occur with  improper specimen collection/handling, submission of specimen other than nasopharyngeal swab, presence of viral mutation(s) within the areas targeted by this assay, and inadequate number of viral copies(<138 copies/mL). A negative result must be combined with clinical observations, patient history, and epidemiological information. The expected result is Negative.  Fact Sheet for Patients:  EntrepreneurPulse.com.au  Fact Sheet for Healthcare Providers:   IncredibleEmployment.be  This test is no t yet approved or cleared by the [place] FDA and  has been authorized for detection and/or diagnosis of SARS-CoV-2 by FDA under an Emergency Use Authorization (EUA). This EUA will remain  in effect (meaning this test can be used) for the duration of the COVID-19 declaration under Section 564(b)(1) of the Act, 21 U.S.C.section 360bbb-3(b)(1), unless the authorization is terminated  or revoked sooner.       Influenza A by PCR NEGATIVE NEGATIVE Final   Influenza B by PCR NEGATIVE NEGATIVE Final    Comment: (NOTE) The Xpert Xpress SARS-CoV-2/FLU/RSV plus assay is intended as an aid in the diagnosis of influenza from Nasopharyngeal  swab specimens and should not be used as a sole basis for treatment. Nasal washings and aspirates are unacceptable for Xpert Xpress SARS-CoV-2/FLU/RSV testing.  Fact Sheet for Patients: EntrepreneurPulse.com.au  Fact Sheet for Healthcare Providers: IncredibleEmployment.be  This test is not yet approved or cleared by the [place] FDA and has been authorized for detection and/or diagnosis of SARS-CoV-2 by FDA under an Emergency Use Authorization (EUA). This EUA will remain in effect (meaning this test can be used) for the duration of the COVID-19 declaration under Section 564(b)(1) of the Act, 21 U.S.C. section 360bbb-3(b)(1), unless the authorization is terminated or revoked.  Performed at [facility], [address] [place], [place] [address]      Scheduled Meds:  apixaban  5 mg Oral BID   budesonide (PULMICORT) nebulizer solution  0.5 mg Nebulization BID   diltiazem  300 mg Oral Daily   insulin aspart  0-15 Units Subcutaneous TID WC   insulin aspart  0-5 Units Subcutaneous QHS   ipratropium-albuterol  3 mL Nebulization Q6H   loratadine  10 mg Oral Daily   methylPREDNISolone (SOLU-MEDROL) injection  60 mg Intravenous Q12H   metoprolol  tartrate  25 mg Oral Daily   montelukast  10 mg Oral QHS   pantoprazole  40 mg Oral Daily   rosuvastatin  20 mg Oral Daily   Continuous Infusions:  sodium chloride      Procedures/Studies: DG Chest 1 View  Result Date: 04/17/2021 CLINICAL DATA:  Congestion, cough EXAM: CHEST  1 VIEW COMPARISON:  Chest radiograph 03/20/2021 FINDINGS: The cardiomediastinal silhouette is stable. There are patchy opacities in the lateral left base. A background of increased interstitial markings is overall unchanged, likely chronic. Dense retrocardiac opacity is consistent with a hiatal hernia seen on prior CT. There is no other focal airspace disease. There is no significant pleural effusion. There is no pneumothorax. There is no acute osseous abnormality. IMPRESSION: Patchy opacities in the lateral left base may reflect infection in the correct clinical setting. Electronically Signed   By: Valetta Mole M.D.   On: 04/17/2021 15:12   DG Chest Portable 1 View  Result Date: 04/20/2021 CLINICAL DATA:  Dyspnea, shortness of breath EXAM: PORTABLE CHEST 1 VIEW COMPARISON:  04/17/2021 FINDINGS: Lower lung volumes. Likely unchanged cardiac and mediastinal contours when accounting for differences in lung volumes and projection. Likely small left pleural effusion. No acute osseous abnormality. IMPRESSION: Suspect small left pleural effusion, however this could also represent prominent epicardial fat seen on the 03/20/2021 CT in the setting of lower lung volumes. Electronically Signed   By: Merilyn Baba M.D.   On: 04/20/2021 23:33    Orson Eva, DO  [facility]  If 7PM-7AM, please contact night-coverage [email or web address] Password [email or web address] 04/23/2021, 9:58 AM   LOS: 0 days

## 2021-04-23 NOTE — Progress Notes (Signed)
Went in to give patient breathing treatment.  Patient had on [place] but it was not hooked up.  Patient sat was 90% on RA.  Placed patient back on 2L for her to sleep with.

## 2021-04-23 NOTE — Progress Notes (Signed)
Physical Therapy Treatment Patient Details Name: Krystal [name] MRN: [id number] DOB: 1941-04-26 Today's Date: 04/23/2021   History of Present Illness Krystal [name] is a 80 y.o. female who presents with c/o dyspnea. PMH: asthma, brain tumor, breast cancer, COPD, hyperlipidemia, macular degeneration, atrial fibrillation, L  heart cath 03/21/21    PT Comments    Patient  agreeable for therapy and states she ambulated with nursing staff earlier in day.  Patient demonstrates good return for bed mobility, fair/good return for completing BLE ROM/strengthening exercises while seated at bedside, slow labored cadence for ambulation with occasional bumping into near by objects due to drifting left/right without loss of balance and limited mostly due to c/o fatigue.  Patient requested to go back to bed after therapy - her son present in room.  Patient will benefit from continued physical therapy in hospital and recommended venue below to increase strength, balance, endurance for safe ADLs and gait.     Recommendations for follow up therapy are one component of a multi-disciplinary discharge planning process, led by the attending physician.  Recommendations may be updated based on patient status, additional functional criteria and insurance authorization.  Follow Up Recommendations  Home health PT;Supervision for mobility/OOB;Supervision - Intermittent     Equipment Recommendations  None recommended by PT    Recommendations for Other Services       Precautions / Restrictions Precautions Precautions: Fall Restrictions Weight Bearing Restrictions: No     Mobility  Bed Mobility Overal bed mobility: Independent             General bed mobility comments: supine to sitting, sit to supine    Transfers Overall transfer level: Needs assistance Equipment used: Rolling walker (2 wheeled) Transfers: Sit to/from [facility] Sit to Stand: Supervision Stand pivot transfers:  Supervision       General transfer comment: slightly labored movment with increased time  Ambulation/Gait Ambulation/Gait assistance: Supervision Gait Distance (Feet): 65 Feet Assistive device: Rolling walker (2 wheeled) Gait Pattern/deviations: Decreased step length - right;Decreased step length - left;Decreased stride length;Drifts right/left Gait velocity: decreased   General Gait Details: slow labored cadence with occasional bumping into nearby objects due to drifting left/right, no loss of balance, limited secondary to fatigue   Stairs             Wheelchair Mobility    Modified Rankin (Stroke Patients Only)       Balance Overall balance assessment: Needs assistance Sitting-balance support: Feet supported;No upper extremity supported Sitting balance-Leahy Scale: Good Sitting balance - Comments: seated at EOB   Standing balance support: During functional activity;Bilateral upper extremity supported Standing balance-Leahy Scale: Fair Standing balance comment: using RW                            Cognition Arousal/Alertness: Awake/alert Behavior During Therapy: WFL for tasks assessed/performed Overall Cognitive Status: Within Functional Limits for tasks assessed                                        Exercises General Exercises - Lower Extremity Long Arc Quad: Seated;AROM;Strengthening;Both;10 reps Hip Flexion/Marching: Seated;AROM;Strengthening;Both;10 reps Toe Raises: Seated;AROM;Strengthening;Both;10 reps Heel Raises: Seated;AROM;Strengthening;Both;10 reps    General Comments        Pertinent Vitals/Pain Pain Assessment: 0-10 Pain Score: 5  Pain Location: right side of stomach Pain Descriptors / Indicators: Sore Pain Intervention(s):  Limited activity within patient's tolerance;Monitored during session    Home Living                      Prior Function            PT Goals (current goals can now be found  in the care plan section) Acute Rehab PT Goals Patient Stated Goal: return home with son and daughter in law PT Goal Formulation: With patient/family Time For Goal Achievement: 04/26/21 Potential to Achieve Goals: Good Progress towards PT goals: Progressing toward goals    Frequency    Min 3X/week      PT Plan Current plan remains appropriate    Co-evaluation              AM-PAC PT "6 Clicks" Mobility   Outcome Measure  Help needed turning from your back to your side while in a flat bed without using bedrails?: None Help needed moving from lying on your back to sitting on the side of a flat bed without using bedrails?: None Help needed moving to and from a bed to a chair (including a wheelchair)?: A Little Help needed standing up from a chair using your arms (e.g., wheelchair or bedside chair)?: A Little Help needed to walk in hospital room?: A Little Help needed climbing 3-5 steps with a railing? : A Little 6 Click Score: 20    End of Session   Activity Tolerance: Patient tolerated treatment well;Patient limited by fatigue Patient left: in bed;with call bell/phone within reach;with family/visitor present Nurse Communication: Mobility status PT Visit Diagnosis: Unsteadiness on feet (R26.81);Other abnormalities of gait and mobility (R26.89);Muscle weakness (generalized) (M62.81)     Time: 0932-6712 PT Time Calculation (min) (ACUTE ONLY): 24 min  Charges:  $Gait Training: 8-22 mins $Therapeutic Exercise: 8-22 mins                     3:40 PM, 04/23/21 Lonell Grandchild, MPT Physical Therapist with [facility] [phone] office [phone] mobile phone

## 2021-04-23 NOTE — Progress Notes (Signed)
**Note De-identified Renaud Celli Obfuscation** EKG complete and placed in patient chart 

## 2021-04-24 DIAGNOSIS — J9601 Acute respiratory failure with hypoxia: Secondary | ICD-10-CM | POA: Diagnosis not present

## 2021-04-24 DIAGNOSIS — J441 Chronic obstructive pulmonary disease with (acute) exacerbation: Secondary | ICD-10-CM | POA: Diagnosis not present

## 2021-04-24 DIAGNOSIS — E876 Hypokalemia: Secondary | ICD-10-CM | POA: Diagnosis not present

## 2021-04-24 LAB — BASIC METABOLIC PANEL
Anion gap: 5 (ref 5–15)
BUN: 25 mg/dL — ABNORMAL HIGH (ref 8–23)
CO2: 31 mmol/L (ref 22–32)
Calcium: 8.8 mg/dL — ABNORMAL LOW (ref 8.9–10.3)
Chloride: 99 mmol/L (ref 98–111)
Creatinine, Ser: 0.69 mg/dL (ref 0.44–1.00)
GFR, Estimated: >60 mL/min (ref >60)
Glucose, Bld: 175 mg/dL — ABNORMAL HIGH (ref 70–99)
Potassium: 4.6 mmol/L (ref 3.5–5.1)
Sodium: 135 mmol/L (ref 135–145)

## 2021-04-24 LAB — MAGNESIUM: Magnesium: 2.4 mg/dL (ref 1.7–2.4)

## 2021-04-24 LAB — GLUCOSE, CAPILLARY
Glucose-Capillary: 196 mg/dL — ABNORMAL HIGH (ref 70–99)
Glucose-Capillary: 150 mg/dL — ABNORMAL HIGH (ref 70–99)

## 2021-04-24 MED ORDER — IPRATROPIUM-ALBUTEROL 0.5-2.5 (3) MG/3ML IN SOLN: 3.0000 mL | Freq: Two times a day (BID) | RESPIRATORY_TRACT | Status: DC

## 2021-04-24 MED ORDER — PREDNISONE 10 MG PO TABS: 60.0000 mg | ORAL_TABLET | Freq: Every day | ORAL | 0 refills | Status: DC

## 2021-04-24 MED ORDER — PREDNISONE 20 MG PO TABS: 60.0000 mg | ORAL_TABLET | Freq: Every day | ORAL | Status: DC

## 2021-04-24 NOTE — TOC Transition Note (Signed)
Transition of Care [facility]) - CM/SW Discharge Note   Patient Details  Name: Krystal [name] MRN: [id number] Date of Birth: 21-Mar-1941  [facility] of [facility]) CM/SW Contact:  Ihor Gully, LCSW Phone Number: 04/24/2021, 12:40 PM   Clinical Narrative:    PT evaluation recommends HHPT. Attending requests that patient be set up with HHPT. [name] is accepting of patient's insurance and accepts referral for HHPT.    Final next level of care: [place] Barriers to Discharge: No Barriers Identified   Patient Goals and CMS Choice        Discharge Placement                       Discharge Plan and Services                          HH Arranged: PT HH Agency: [place] Date [facility] Agency Contacted: 04/24/21 Time [place]: 2284 Representative spoke with at [place]: [place] Determinants of Health ([place]) Interventions     Readmission Risk Interventions Readmission Risk Prevention Plan 03/21/2021  Transportation Screening Complete  Home Care Screening Complete  Medication Review (RN CM) Complete  Some recent data might be hidden

## 2021-04-24 NOTE — Discharge Summary (Signed)
Physician Discharge Summary  SAMMYE STAFF ION:[id number] DOB: 01/15/1941 DOA: 04/20/2021  PCP: Dettinger, Fransisca Kaufmann, MD  Admit date: 04/20/2021 Discharge date: 04/24/2021  Admitted From: Home Disposition:  Home  Recommendations for Outpatient Follow-up:  Follow up with PCP in 1-2 weeks Please obtain BMP/CBC in one week   Home Health: [facility]  Discharge Condition: Stable CODE STATUS: FULL Diet recommendation: Heart Healthy / Carb Modified    Brief/Interim Summary: 80 y.o. female, with history of asthma, brain tumor, breast cancer, COPD, hyperlipidemia, macular degeneration, atrial fibrillation, and more presents the ED with a chief complaint of dyspnea.  Patient expresses that she is tired and had just fallen asleep at the time of my interview.  She is count of resistant to answer some questions, and for that reason history may be slightly limited.  Patient reports that she has had a rattling in her chest and dyspnea.  Its been worse today but she has had over 1 month.  She said what was worse about a today was the rattling.  Her dyspnea and rattling are worse with exertion.  She has had palpitations associated.  She reports that she did take her Eliquis and diltiazem this morning but did not take her metoprolol today.  She is felt generally fatigued.  She admits to wheezing and reports that she has tried her albuterol inhaler without relief.  She has had a dry cough.  She denies fevers.  Patient reports 2-3 pillow orthopnea at home.  She has no oxygen requirement at baseline.  Patient has no other complaints at this time.  Patient does not smoke, does not drink alcohol, does not use illicit drugs.  Patient is vaccinated for COVID.  Patient is full code.  Discharge Diagnoses:   Acute respiratory failure with hypoxia - secondary to COPD exacerbation  -weaned to RA -continue pulmicort -restart IV solumedrol-->d/c home with prednisone taper -increase duoneb to q 6  initially -9/21--ambulated on RA without desaturation <92%   Hypokalemia  -repleted.  -now hyperkalemic>>start IVF x 12 hours>>resolved -am BMP   Steroid induced hyperglycemia /Impaired glucose tolerance - controlled with current measures,  -04/21/21 A1C--6.0  -lifestyle modification   Paroxysmal atrial fibrillation  -Heart rate stable on metoprolol, diltiazem  -anticoagulated on apixaban.  -currently in sinus   GERD - Pt remains on protonix for GI protection.     Hyperlipidemia - - resumed home rosuvastatin.     valvular heart disease -aortic stenosis/aortic insufficiency-she will need follow-up echocardiograms in the future.   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 -continue statin, apixaban -cycle troponins--neg -personally reviewed EKG--sinus, no STT change -03/21/21 Echo--EF 65-70%, no WMA, mild-mod TR        Discharge Instructions   Allergies as of 04/24/2021       Reactions   Actonel [risedronate] Nausea And Vomiting, Other (See Comments)   Throat tightness   Fosamax [alendronate Sodium] Other (See Comments)   esophagitis   Mevacor [lovastatin] Other (See Comments)   myalgias   Miacalcin [calcitonin (salmon)] Nausea And Vomiting   Mobic [meloxicam] Hives, Itching   rash        Medication List     STOP taking these medications    doxycycline 100 MG capsule Commonly known as: VIBRAMYCIN       TAKE these medications    albuterol 108 (90 Base) MCG/ACT inhaler Commonly known as: VENTOLIN HFA INHALE TWO PUFFS BY MOUTH 4 TIMES DAILY AS NEEDED What changed: See the new instructions.   apixaban 5 MG Tabs tablet  Commonly known as: ELIQUIS Take 1 tablet (5 mg total) by mouth 2 (two) times daily.   Calcium Citrate-Vitamin D 315-250 MG-UNIT Tabs Commonly known as: Calcium Citrate + D Take 2 tablets by mouth daily.   cetirizine 10 MG tablet Commonly known as: ZYRTEC Take 5-10 mg by mouth at bedtime.   diclofenac Sodium 1 % Gel Commonly known as:  Voltaren Apply 2 g topically 4 (four) times daily.   diltiazem 300 MG 24 hr capsule Commonly known as: CARDIZEM CD Take 1 capsule (300 mg total) by mouth daily.   esomeprazole 40 MG capsule Commonly known as: NEXIUM TAKE 1 CAPSULE BY MOUTH EVERY DAY   ferrous sulfate 325 (65 FE) MG tablet Take 1 tablet (325 mg total) by mouth at bedtime.   ipratropium-albuterol 0.5-2.5 (3) MG/3ML Soln Commonly known as: DUONEB TAKE 3 MLS BY NEBULIZATION EVERY 6 (SIX) HOURS AS NEEDED. DX: ASTHMA 493.90   metoprolol tartrate 25 MG tablet Commonly known as: LOPRESSOR TAKE 1 TABLET (25 MG TOTAL) BY MOUTH DAILY AS NEEDED.   montelukast 10 MG tablet Commonly known as: SINGULAIR One tablet by mouth daily at bedtime   nitroGLYCERIN 0.4 MG/SPRAY spray Commonly known as: NITROLINGUAL ONE SPRAY UNDER TONGUE AS NEED FOR CHESTPAIN. MAY REPEAT IN 5 MINUTES IF PAIN NOT RELIEVED CALL 911.   predniSONE 10 MG tablet Commonly known as: DELTASONE Take 6 tablets (60 mg total) by mouth daily with breakfast. And decrease by one tablet daily Start taking on: April 25, 2021   PRESERVISION/LUTEIN PO Take 1 capsule by mouth at bedtime.   rosuvastatin 20 MG tablet Commonly known as: CRESTOR Take 1 tablet (20 mg total) by mouth daily.   Systane 0.4-0.3 % Soln Generic drug: Polyethyl Glycol-Propyl Glycol Place 1 drop into both eyes 2 (two) times daily.   Vitamin D 50 MCG (2000 UT) Caps Take 1 capsule by mouth at bedtime.        Allergies  Allergen Reactions   Actonel [Risedronate] Nausea And Vomiting and Other (See Comments)    Throat tightness   Fosamax [Alendronate Sodium] Other (See Comments)    esophagitis   Mevacor [Lovastatin] Other (See Comments)    myalgias   Miacalcin [Calcitonin ([place])] Nausea And Vomiting   Mobic [Meloxicam] Hives and Itching    rash    Consultations: none   Procedures/Studies: DG Chest 1 View  Result Date: 04/17/2021 CLINICAL DATA:  Congestion, cough EXAM:  CHEST  1 VIEW COMPARISON:  Chest radiograph 03/20/2021 FINDINGS: The cardiomediastinal silhouette is stable. There are patchy opacities in the lateral left base. A background of increased interstitial markings is overall unchanged, likely chronic. Dense retrocardiac opacity is consistent with a hiatal hernia seen on prior CT. There is no other focal airspace disease. There is no significant pleural effusion. There is no pneumothorax. There is no acute osseous abnormality. IMPRESSION: Patchy opacities in the lateral left base may reflect infection in the correct clinical setting. Electronically Signed   By: Valetta Mole M.D.   On: 04/17/2021 15:12   DG Chest Portable 1 View  Result Date: 04/20/2021 CLINICAL DATA:  Dyspnea, shortness of breath EXAM: PORTABLE CHEST 1 VIEW COMPARISON:  04/17/2021 FINDINGS: Lower lung volumes. Likely unchanged cardiac and mediastinal contours when accounting for differences in lung volumes and projection. Likely small left pleural effusion. No acute osseous abnormality. IMPRESSION: Suspect small left pleural effusion, however this could also represent prominent epicardial fat seen on the 03/20/2021 CT in the setting of lower lung volumes. Electronically Signed  By: Merilyn Baba M.D.   On: 04/20/2021 23:33        Discharge Exam: Vitals:   04/24/21 0510 04/24/21 0732  BP: (!) 109/56   Pulse: 63   Resp: 17   Temp: 97.8 F (36.6 C)   SpO2: 98% 100%   Vitals:   04/23/21 2102 04/24/21 0500 04/24/21 0510 04/24/21 0732  BP: 115/65  (!) 109/56   Pulse: 74  63   Resp: 18  17   Temp: 98.3 F (36.8 C)  97.8 F (36.6 C)   TempSrc: Oral  Oral   SpO2: 98%  98% 100%  Weight:  79.6 kg    Height:        General: Pt is alert, awake, not in acute distress Cardiovascular: RRR, S1/S2 +, no rubs, no gallops Respiratory: bibasilar rales. No wheeze Abdominal: Soft, NT, ND, bowel sounds + Extremities: nonpitting edema, no cyanosis   The results of significant diagnostics  from this hospitalization (including imaging, microbiology, ancillary and laboratory) are listed below for reference.    Significant Diagnostic Studies: DG Chest 1 View  Result Date: 04/17/2021 CLINICAL DATA:  Congestion, cough EXAM: CHEST  1 VIEW COMPARISON:  Chest radiograph 03/20/2021 FINDINGS: The cardiomediastinal silhouette is stable. There are patchy opacities in the lateral left base. A background of increased interstitial markings is overall unchanged, likely chronic. Dense retrocardiac opacity is consistent with a hiatal hernia seen on prior CT. There is no other focal airspace disease. There is no significant pleural effusion. There is no pneumothorax. There is no acute osseous abnormality. IMPRESSION: Patchy opacities in the lateral left base may reflect infection in the correct clinical setting. Electronically Signed   By: Valetta Mole M.D.   On: 04/17/2021 15:12   DG Chest Portable 1 View  Result Date: 04/20/2021 CLINICAL DATA:  Dyspnea, shortness of breath EXAM: PORTABLE CHEST 1 VIEW COMPARISON:  04/17/2021 FINDINGS: Lower lung volumes. Likely unchanged cardiac and mediastinal contours when accounting for differences in lung volumes and projection. Likely small left pleural effusion. No acute osseous abnormality. IMPRESSION: Suspect small left pleural effusion, however this could also represent prominent epicardial fat seen on the 03/20/2021 CT in the setting of lower lung volumes. Electronically Signed   By: Merilyn Baba M.D.   On: 04/20/2021 23:33    Microbiology: Recent Results (from the past 240 hour(s))  Resp Panel by RT-PCR (Flu A&B, Covid) Nasopharyngeal Swab     Status: None   Collection Time: 04/17/21  5:00 PM   Specimen: Nasopharyngeal Swab; Nasopharyngeal(NP) swabs in vial transport medium  Result Value Ref Range Status   SARS Coronavirus 2 by RT PCR NEGATIVE NEGATIVE Final    Comment: (NOTE) SARS-CoV-2 target nucleic acids are NOT DETECTED.  The SARS-CoV-2 RNA is  generally detectable in upper respiratory specimens during the acute phase of infection. The lowest concentration of SARS-CoV-2 viral copies this assay can detect is 138 copies/mL. A negative result does not preclude SARS-Cov-2 infection and should not be used as the sole basis for treatment or other patient management decisions. A negative result may occur with  improper specimen collection/handling, submission of specimen other than nasopharyngeal swab, presence of viral mutation(s) within the areas targeted by this assay, and inadequate number of viral copies(<138 copies/mL). A negative result must be combined with clinical observations, patient history, and epidemiological information. The expected result is Negative.  Fact Sheet for Patients:  EntrepreneurPulse.com.au  Fact Sheet for Healthcare Providers:  IncredibleEmployment.be  This test is no t yet  approved or cleared by the [place] and  has been authorized for detection and/or diagnosis of SARS-CoV-2 by FDA under an Emergency Use Authorization (EUA). This EUA will remain  in effect (meaning this test can be used) for the duration of the COVID-19 declaration under Section 564(b)(1) of the Act, 21 U.S.C.section 360bbb-3(b)(1), unless the authorization is terminated  or revoked sooner.       Influenza A by PCR NEGATIVE NEGATIVE Final   Influenza B by PCR NEGATIVE NEGATIVE Final    Comment: (NOTE) The Xpert Xpress SARS-CoV-2/FLU/RSV plus assay is intended as an aid in the diagnosis of influenza from Nasopharyngeal swab specimens and should not be used as a sole basis for treatment. Nasal washings and aspirates are unacceptable for Xpert Xpress SARS-CoV-2/FLU/RSV testing.  Fact Sheet for Patients: EntrepreneurPulse.com.au  Fact Sheet for Healthcare Providers: IncredibleEmployment.be  This test is not yet approved or cleared by the [place] FDA and has been authorized for detection and/or diagnosis of SARS-CoV-2 by FDA under an Emergency Use Authorization (EUA). This EUA will remain in effect (meaning this test can be used) for the duration of the COVID-19 declaration under Section 564(b)(1) of the Act, 21 U.S.C. section 360bbb-3(b)(1), unless the authorization is terminated or revoked.  Performed at [facility], [address] [place], [place] [address]   Resp Panel by RT-PCR (Flu A&B, Covid) Nasopharyngeal Swab     Status: None   Collection Time: 04/20/21 10:15 PM   Specimen: Nasopharyngeal Swab; Nasopharyngeal(NP) swabs in vial transport medium  Result Value Ref Range Status   SARS Coronavirus 2 by RT PCR NEGATIVE NEGATIVE Final    Comment: (NOTE) SARS-CoV-2 target nucleic acids are NOT DETECTED.  The SARS-CoV-2 RNA is generally detectable in upper respiratory specimens during the acute phase of infection. The lowest concentration of SARS-CoV-2 viral copies this assay can detect is 138 copies/mL. A negative result does not preclude SARS-Cov-2 infection and should not be used as the sole basis for treatment or other patient management decisions. A negative result may occur with  improper specimen collection/handling, submission of specimen other than nasopharyngeal swab, presence of viral mutation(s) within the areas targeted by this assay, and inadequate number of viral copies(<138 copies/mL). A negative result must be combined with clinical observations, patient history, and epidemiological information. The expected result is Negative.  Fact Sheet for Patients:  EntrepreneurPulse.com.au  Fact Sheet for Healthcare Providers:  IncredibleEmployment.be  This test is no t yet approved or cleared by the [place] FDA and  has been authorized for detection and/or diagnosis of SARS-CoV-2 by FDA under an Emergency Use Authorization (EUA). This EUA will remain  in effect  (meaning this test can be used) for the duration of the COVID-19 declaration under Section 564(b)(1) of the Act, 21 U.S.C.section 360bbb-3(b)(1), unless the authorization is terminated  or revoked sooner.       Influenza A by PCR NEGATIVE NEGATIVE Final   Influenza B by PCR NEGATIVE NEGATIVE Final    Comment: (NOTE) The Xpert Xpress SARS-CoV-2/FLU/RSV plus assay is intended as an aid in the diagnosis of influenza from Nasopharyngeal swab specimens and should not be used as a sole basis for treatment. Nasal washings and aspirates are unacceptable for Xpert Xpress SARS-CoV-2/FLU/RSV testing.  Fact Sheet for Patients: EntrepreneurPulse.com.au  Fact Sheet for Healthcare Providers: IncredibleEmployment.be  This test is not yet approved or cleared by the [place] FDA and has been authorized for detection and/or diagnosis of SARS-CoV-2 by FDA under an Emergency  Use Authorization (EUA). This EUA will remain in effect (meaning this test can be used) for the duration of the COVID-19 declaration under Section 564(b)(1) of the Act, 21 U.S.C. section 360bbb-3(b)(1), unless the authorization is terminated or revoked.  Performed at [facility], [address] [place], [place] [address]      Labs: Basic Metabolic Panel: Recent Labs  Lab 04/17/21 1459 04/20/21 2200 04/21/21 0602 04/23/21 0521 04/24/21 0516  NA 140 137 139 133* 135  K 3.7 3.3* 3.4* 5.2* 4.6  CL 105 104 103 100 99  CO2 28 26 23 29 31   GLUCOSE 87 176* 193* 130* 175*  BUN 18 11 15  26* 25*  CREATININE 0.67 0.79 0.93 0.64 0.69  CALCIUM 8.8* 8.5* 9.0 8.7* 8.8*  MG 2.1  --  2.1  --  2.4   Liver Function Tests: Recent Labs  Lab 04/20/21 2200 04/21/21 0602  AST 36 39  ALT 29 <5  ALKPHOS 47 47  BILITOT 0.4 0.5  PROT 6.6 6.7  ALBUMIN 3.8 3.8   No results for input(s): LIPASE, AMYLASE in the last 168 hours. No results for input(s): AMMONIA in the last 168  hours. CBC: Recent Labs  Lab 04/17/21 1459 04/20/21 2200 04/21/21 0602  WBC 5.8 9.2 5.3  NEUTROABS 3.2 3.7  --   HGB 13.4 13.6 13.3  HCT 43.0 43.6 42.7  MCV 95.3 95.0 93.6  PLT 143* 160 142*   Cardiac Enzymes: No results for input(s): CKTOTAL, CKMB, CKMBINDEX, TROPONINI in the last 168 hours. BNP: Invalid input(s): POCBNP CBG: Recent Labs  Lab 04/23/21 0753 04/23/21 1125 04/23/21 1632 04/23/21 2107 04/24/21 0721  GLUCAP 113* 134* 159* 142* 196*    Time coordinating discharge:  36 minutes  Signed:  Orson Eva, DO [facility] Pager: [phone] 04/24/2021, 10:22 AM

## 2021-04-25 ENCOUNTER — Telehealth: Payer: Self-pay

## 2021-04-25 NOTE — Telephone Encounter (Signed)
Transition Care Management Follow-up Telephone Call Date of discharge and from where: 04/24/21 [facility] Diagnosis:  COPD exacerbation - respiratory failure  How have you been since you were released from the hospital? Feeling better Any questions or concerns? No  Items Reviewed: Did the pt receive and understand the discharge instructions provided? Yes  Medications obtained and verified? Yes  Other? No  Any new allergies since your discharge? No  Dietary orders reviewed? Yes Do you have support at home? Yes   Home Care and Equipment/Supplies: Were home health services ordered? yes If so, what is the name of the agency? unknown  Has the agency set up a time to come to the patient's home? yes Were any new equipment or medical supplies ordered?  No What is the name of the medical supply agency? N/a Were you able to get the supplies/equipment? not applicable Do you have any questions related to the use of the equipment or supplies? No  Functional Questionnaire: (I = Independent and D = Dependent) ADLs: I  Bathing/Dressing- I  Meal Prep- I  Eating- I  Maintaining continence- I  Transferring/Ambulation- I  Managing Meds- I  Follow up appointments reviewed:  PCP Hospital f/u appt confirmed? Yes  Scheduled to see Dettinger on 9/29 @ 1:40. [place] [facility] f/u appt confirmed? No   Are transportation arrangements needed? No  If their condition worsens, is the pt aware to call PCP or go to the Emergency Dept.? Yes Was the patient provided with contact information for the PCP's office or ED? Yes Was to pt encouraged to call back with questions or concerns? Yes

## 2021-04-26 ENCOUNTER — Other Ambulatory Visit: Payer: Self-pay | Admitting: *Deleted

## 2021-04-26 DIAGNOSIS — I25119 Atherosclerotic heart disease of native coronary artery with unspecified angina pectoris: Secondary | ICD-10-CM | POA: Diagnosis not present

## 2021-04-26 DIAGNOSIS — I350 Nonrheumatic aortic (valve) stenosis: Secondary | ICD-10-CM | POA: Diagnosis not present

## 2021-04-26 DIAGNOSIS — M810 Age-related osteoporosis without current pathological fracture: Secondary | ICD-10-CM | POA: Diagnosis not present

## 2021-04-26 DIAGNOSIS — R69 Illness, unspecified: Secondary | ICD-10-CM | POA: Diagnosis not present

## 2021-04-26 DIAGNOSIS — J9601 Acute respiratory failure with hypoxia: Secondary | ICD-10-CM | POA: Diagnosis not present

## 2021-04-26 DIAGNOSIS — I700 Atherosclerosis of aorta: Secondary | ICD-10-CM | POA: Diagnosis not present

## 2021-04-26 DIAGNOSIS — I480 Paroxysmal atrial fibrillation: Secondary | ICD-10-CM | POA: Diagnosis not present

## 2021-04-26 DIAGNOSIS — D509 Iron deficiency anemia, unspecified: Secondary | ICD-10-CM | POA: Diagnosis not present

## 2021-04-26 DIAGNOSIS — J441 Chronic obstructive pulmonary disease with (acute) exacerbation: Secondary | ICD-10-CM | POA: Diagnosis not present

## 2021-04-26 MED ORDER — DENOSUMAB 60 MG/ML ~~LOC~~ SOSY: 60.0000 mg | PREFILLED_SYRINGE | Freq: Once | SUBCUTANEOUS | 0 refills | Status: AC

## 2021-04-26 NOTE — Telephone Encounter (Signed)
CALL INTO PHARMACY - they will let us know if PA is needed. Rx sent to pharmacy for patient.

## 2021-04-29 DIAGNOSIS — H353124 Nonexudative age-related macular degeneration, left eye, advanced atrophic with subfoveal involvement: Secondary | ICD-10-CM | POA: Diagnosis not present

## 2021-04-29 DIAGNOSIS — H353211 Exudative age-related macular degeneration, right eye, with active choroidal neovascularization: Secondary | ICD-10-CM | POA: Diagnosis not present

## 2021-05-01 DIAGNOSIS — I25119 Atherosclerotic heart disease of native coronary artery with unspecified angina pectoris: Secondary | ICD-10-CM | POA: Diagnosis not present

## 2021-05-01 DIAGNOSIS — I350 Nonrheumatic aortic (valve) stenosis: Secondary | ICD-10-CM | POA: Diagnosis not present

## 2021-05-01 DIAGNOSIS — D509 Iron deficiency anemia, unspecified: Secondary | ICD-10-CM | POA: Diagnosis not present

## 2021-05-01 DIAGNOSIS — M810 Age-related osteoporosis without current pathological fracture: Secondary | ICD-10-CM | POA: Diagnosis not present

## 2021-05-01 DIAGNOSIS — J9601 Acute respiratory failure with hypoxia: Secondary | ICD-10-CM | POA: Diagnosis not present

## 2021-05-01 DIAGNOSIS — R69 Illness, unspecified: Secondary | ICD-10-CM | POA: Diagnosis not present

## 2021-05-01 DIAGNOSIS — I480 Paroxysmal atrial fibrillation: Secondary | ICD-10-CM | POA: Diagnosis not present

## 2021-05-01 DIAGNOSIS — I700 Atherosclerosis of aorta: Secondary | ICD-10-CM | POA: Diagnosis not present

## 2021-05-01 DIAGNOSIS — J441 Chronic obstructive pulmonary disease with (acute) exacerbation: Secondary | ICD-10-CM | POA: Diagnosis not present

## 2021-05-02 ENCOUNTER — Encounter: Payer: Self-pay | Admitting: Family Medicine

## 2021-05-02 ENCOUNTER — Other Ambulatory Visit: Payer: Self-pay

## 2021-05-02 ENCOUNTER — Ambulatory Visit (INDEPENDENT_AMBULATORY_CARE_PROVIDER_SITE_OTHER): Payer: Medicare HMO | Admitting: Family Medicine

## 2021-05-02 VITALS — BP 128/65 | HR 94 | Ht 63.0 in | Wt 170.0 lb

## 2021-05-02 DIAGNOSIS — J9601 Acute respiratory failure with hypoxia: Secondary | ICD-10-CM

## 2021-05-02 DIAGNOSIS — R609 Edema, unspecified: Secondary | ICD-10-CM | POA: Diagnosis not present

## 2021-05-02 DIAGNOSIS — J441 Chronic obstructive pulmonary disease with (acute) exacerbation: Secondary | ICD-10-CM

## 2021-05-02 DIAGNOSIS — Z23 Encounter for immunization: Secondary | ICD-10-CM | POA: Diagnosis not present

## 2021-05-02 MED ORDER — BREZTRI AEROSPHERE 160-9-4.8 MCG/ACT IN AERO: 1.0000 | INHALATION_SPRAY | Freq: Two times a day (BID) | RESPIRATORY_TRACT | 0 refills | Status: DC

## 2021-05-02 MED ORDER — DEXAMETHASONE 2 MG PO TABS: ORAL_TABLET | ORAL | 0 refills | Status: DC

## 2021-05-02 NOTE — Progress Notes (Signed)
BP 128/65   Pulse 94   Ht _0  (1.6 m)   Wt 170 lb (77.1 kg)   SpO2 96%   BMI 30.11 kg/m    Subjective:   Patient ID: Krystal [name], female    DOB: Apr 17, 1941, 80 y.o.   MRN: [id number]  HPI: URIEL HORKEY is a 80 y.o. female presenting on 05/02/2021 for Hospitalization Follow-up (COPD) and Leg Swelling (Bilateral/ RLE is painful)   HPI Transitional care telephone Transition Care Management Follow-up Telephone Call Date of discharge and from where: [facility]/24/21 [facility] Diagnosis:  COPD exacerbation - respiratory failure  How have you been since you were released from the hospital? Feeling better Any questions or concerns? No Contacted by Adalberto Cole LPN on April 25, 2021  Transition of care visit Patient was admitted to the hospital 04/20/2021 and discharged on 04/24/2021.  She was admitted for shortness of breath and looks that she was diagnosed with COPD exacerbation as an acute respiratory failure.  During hospitalization she was also diagnosed with hypokalemia.  She was sent home with doxycycline and to continue her Pulmicort and was given a prednisone taper to go home with.  She is still having some shortness of breath and now having a lot of leg swelling in both of her legs to wear she is having some pain because of how much is stretching it out.  She denies any fevers or chills.  She says her breathing has been better but is still not where she would like to be.  She is still having coughing.  She did finish the prednisone that they gave her a couple days ago.  She never took the doxycycline that they gave her because they told her to hold onto it and not take it right away.  She is using her albuterol and duo nebs consistently throughout the day and they do help but she is still having issues clearing it.  She is asking if she can use Mucinex.  She is especially short of breath on exertion more than she was prior to the hospital visit.  Relevant past medical, surgical,  family and social history reviewed and updated as indicated. Interim medical history since our last visit reviewed. Allergies and medications reviewed and updated.  Review of Systems  Constitutional:  Negative for chills and fever.  Eyes:  Negative for visual disturbance.  Respiratory:  Positive for shortness of breath and wheezing. Negative for chest tightness.   Cardiovascular:  Negative for chest pain and leg swelling.  Musculoskeletal:  Negative for back pain and gait problem.  Skin:  Negative for rash.  Neurological:  Negative for light-headedness and headaches.  Psychiatric/Behavioral:  Negative for agitation and behavioral problems.   All other systems reviewed and are negative.  Per HPI unless specifically indicated above   Allergies as of 05/02/2021       Reactions   Actonel [risedronate] Nausea And Vomiting, Other (See Comments)   Throat tightness   Fosamax [alendronate Sodium] Other (See Comments)   esophagitis   Mevacor [lovastatin] Other (See Comments)   myalgias   Miacalcin [calcitonin (salmon)] Nausea And Vomiting   Mobic [meloxicam] Hives, Itching   rash        Medication List        Accurate as of May 02, 2021  2:21 PM. If you have any questions, ask your nurse or doctor.          STOP taking these medications    predniSONE 10  MG tablet Commonly known as: DELTASONE Stopped by: Fransisca Kaufmann Jamarrion Budai, MD       TAKE these medications    albuterol 108 (90 Base) MCG/ACT inhaler Commonly known as: VENTOLIN HFA INHALE TWO PUFFS BY MOUTH 4 TIMES DAILY AS NEEDED What changed: See the new instructions.   apixaban 5 MG Tabs tablet Commonly known as: ELIQUIS Take 1 tablet (5 mg total) by mouth 2 (two) times daily.   Breztri Aerosphere 160-9-4.8 MCG/ACT Aero Generic drug: Budeson-Glycopyrrol-Formoterol Inhale 1 puff into the lungs 2 (two) times daily. Started by: Worthy Rancher, MD   Calcium Citrate-Vitamin D 315-250 MG-UNIT Tabs Commonly  known as: Calcium Citrate + D Take 2 tablets by mouth daily.   cetirizine 10 MG tablet Commonly known as: ZYRTEC Take 5-10 mg by mouth at bedtime.   dexamethasone 2 MG tablet Commonly known as: DECADRON Take 4 tablets for 3 days then 3 tablets for 3 days then 2 tablets for 3 days then 1 tablet for 3 days Started by: Fransisca Kaufmann Trenton Verne, MD   diclofenac Sodium 1 % Gel Commonly known as: Voltaren Apply 2 g topically 4 (four) times daily.   diltiazem 300 MG 24 hr capsule Commonly known as: CARDIZEM CD Take 1 capsule (300 mg total) by mouth daily.   esomeprazole 40 MG capsule Commonly known as: NEXIUM TAKE 1 CAPSULE BY MOUTH EVERY DAY   ferrous sulfate 325 (65 FE) MG tablet Take 1 tablet (325 mg total) by mouth at bedtime.   ipratropium-albuterol 0.5-2.5 (3) MG/3ML Soln Commonly known as: DUONEB TAKE 3 MLS BY NEBULIZATION EVERY 6 (SIX) HOURS AS NEEDED. DX: ASTHMA 493.90   metoprolol tartrate 25 MG tablet Commonly known as: LOPRESSOR TAKE 1 TABLET (25 MG TOTAL) BY MOUTH DAILY AS NEEDED.   montelukast 10 MG tablet Commonly known as: SINGULAIR One tablet by mouth daily at bedtime   nitroGLYCERIN 0.4 MG/SPRAY spray Commonly known as: NITROLINGUAL ONE SPRAY UNDER TONGUE AS NEED FOR CHESTPAIN. MAY REPEAT IN 5 MINUTES IF PAIN NOT RELIEVED CALL 911.   PRESERVISION/LUTEIN PO Take 1 capsule by mouth at bedtime.   rosuvastatin 20 MG tablet Commonly known as: CRESTOR Take 1 tablet (20 mg total) by mouth daily.   Systane 0.4-0.3 % Soln Generic drug: Polyethyl Glycol-Propyl Glycol Place 1 drop into both eyes 2 (two) times daily.   Vitamin D 50 MCG (2000 UT) Caps Take 1 capsule by mouth at bedtime.         Objective:   BP 128/65   Pulse 94   Ht _0  (1.6 m)   Wt 170 lb (77.1 kg)   SpO2 96%   BMI 30.11 kg/m   Wt Readings from Last 3 Encounters:  05/02/21 170 lb (77.1 kg)  04/24/21 175 lb 7.8 oz (79.6 kg)  04/17/21 176 lb (79.8 kg)    Physical Exam Vitals and  nursing note reviewed.  Constitutional:      General: She is not in acute distress.    Appearance: She is well-developed. She is not diaphoretic.  Eyes:     Conjunctiva/sclera: Conjunctivae normal.  Cardiovascular:     Rate and Rhythm: Normal rate and regular rhythm.     Heart sounds: Normal heart sounds. No murmur heard. Pulmonary:     Effort: Pulmonary effort is normal. No respiratory distress.     Breath sounds: Normal breath sounds. No stridor. No wheezing, rhonchi or rales.  Chest:     Chest wall: No tenderness.  Musculoskeletal:  General: Swelling (1+ bilateral lower extremity edema) present. No tenderness. Normal range of motion.  Skin:    General: Skin is warm and dry.     Findings: No rash.  Neurological:     Mental Status: She is alert and oriented to person, place, and time.     Coordination: Coordination normal.  Psychiatric:        Behavior: Behavior normal.      Assessment & Plan:   Problem List Items Addressed This Visit       Respiratory   COPD with acute exacerbation ([place]) - Primary   Relevant Medications   Budeson-Glycopyrrol-Formoterol (BREZTRI AEROSPHERE) 160-9-4.8 MCG/ACT AERO   dexamethasone (DECADRON) 2 MG tablet   Other Relevant Orders   CMP14+EGFR   CBC with Differential/Platelet   Acute respiratory failure with hypoxia (HCC)   Relevant Medications   dexamethasone (DECADRON) 2 MG tablet   Other Relevant Orders   CMP14+EGFR   CBC with Differential/Platelet   Other Visit Diagnoses     Peripheral edema       Relevant Medications   dexamethasone (DECADRON) 2 MG tablet   Other Relevant Orders   Compression stockings       Gave sample of breztri and recommended for her to start taking her doxycycline. Follow up plan: Return if symptoms worsen or fail to improve.  Counseling provided for all of the vaccine components Orders Placed This Encounter  Procedures   Compression stockings   CMP14+EGFR   CBC with Differential/Platelet     Caryl Pina, MD [facility] 05/02/2021, 2:21 PM

## 2021-05-03 DIAGNOSIS — I480 Paroxysmal atrial fibrillation: Secondary | ICD-10-CM | POA: Diagnosis not present

## 2021-05-03 DIAGNOSIS — D509 Iron deficiency anemia, unspecified: Secondary | ICD-10-CM | POA: Diagnosis not present

## 2021-05-03 DIAGNOSIS — I350 Nonrheumatic aortic (valve) stenosis: Secondary | ICD-10-CM | POA: Diagnosis not present

## 2021-05-03 DIAGNOSIS — I700 Atherosclerosis of aorta: Secondary | ICD-10-CM | POA: Diagnosis not present

## 2021-05-03 DIAGNOSIS — J9601 Acute respiratory failure with hypoxia: Secondary | ICD-10-CM | POA: Diagnosis not present

## 2021-05-03 DIAGNOSIS — I25119 Atherosclerotic heart disease of native coronary artery with unspecified angina pectoris: Secondary | ICD-10-CM | POA: Diagnosis not present

## 2021-05-03 DIAGNOSIS — J441 Chronic obstructive pulmonary disease with (acute) exacerbation: Secondary | ICD-10-CM | POA: Diagnosis not present

## 2021-05-03 DIAGNOSIS — M810 Age-related osteoporosis without current pathological fracture: Secondary | ICD-10-CM | POA: Diagnosis not present

## 2021-05-03 DIAGNOSIS — R69 Illness, unspecified: Secondary | ICD-10-CM | POA: Diagnosis not present

## 2021-05-03 LAB — CMP14+EGFR
ALT: 23 IU/L (ref 0–32)
AST: 14 IU/L (ref 0–40)
Albumin/Globulin Ratio: 1.6 (ref 1.2–2.2)
Albumin: 3.8 g/dL (ref 3.6–4.6)
Alkaline Phosphatase: 71 IU/L (ref 44–121)
BUN/Creatinine Ratio: 25 (ref 12–28)
BUN: 22 mg/dL (ref 8–27)
Bilirubin Total: 0.5 mg/dL (ref 0.0–1.2)
CO2: 23 mmol/L (ref 20–29)
Calcium: 9.3 mg/dL (ref 8.7–10.3)
Chloride: 100 mmol/L (ref 96–106)
Creatinine, Ser: 0.88 mg/dL (ref 0.57–1.00)
Globulin, Total: 2.4 g/dL (ref 1.5–4.5)
Glucose: 159 mg/dL — ABNORMAL HIGH (ref 70–99)
Potassium: 3.7 mmol/L (ref 3.5–5.2)
Sodium: 143 mmol/L (ref 134–144)
Total Protein: 6.2 g/dL (ref 6.0–8.5)
eGFR: 66 mL/min/{1.73_m2} (ref >59)

## 2021-05-03 LAB — CBC WITH DIFFERENTIAL/PLATELET
Basophils Absolute: 0 10*3/uL (ref 0.0–0.2)
Basos: 0 %
EOS (ABSOLUTE): 0 10*3/uL (ref 0.0–0.4)
Eos: 0 %
Hematocrit: 42.6 % (ref 34.0–46.6)
Hemoglobin: 13.9 g/dL (ref 11.1–15.9)
Immature Grans (Abs): 0.1 10*3/uL (ref 0.0–0.1)
Immature Granulocytes: 1 %
Lymphocytes Absolute: 1.3 10*3/uL (ref 0.7–3.1)
Lymphs: 10 %
MCH: 28.4 pg (ref 26.6–33.0)
MCHC: 32.6 g/dL (ref 31.5–35.7)
MCV: 87 fL (ref 79–97)
Monocytes Absolute: 0.8 10*3/uL (ref 0.1–0.9)
Monocytes: 6 %
Neutrophils Absolute: 10.7 10*3/uL — ABNORMAL HIGH (ref 1.4–7.0)
Neutrophils: 83 %
Platelets: 183 10*3/uL (ref 150–450)
RBC: 4.9 x10E6/uL (ref 3.77–5.28)
RDW: 12.6 % (ref 11.7–15.4)
WBC: 13 10*3/uL — ABNORMAL HIGH (ref 3.4–10.8)

## 2021-05-06 DIAGNOSIS — J441 Chronic obstructive pulmonary disease with (acute) exacerbation: Secondary | ICD-10-CM | POA: Diagnosis not present

## 2021-05-06 DIAGNOSIS — M810 Age-related osteoporosis without current pathological fracture: Secondary | ICD-10-CM | POA: Diagnosis not present

## 2021-05-06 DIAGNOSIS — I480 Paroxysmal atrial fibrillation: Secondary | ICD-10-CM | POA: Diagnosis not present

## 2021-05-06 DIAGNOSIS — I350 Nonrheumatic aortic (valve) stenosis: Secondary | ICD-10-CM | POA: Diagnosis not present

## 2021-05-06 DIAGNOSIS — R69 Illness, unspecified: Secondary | ICD-10-CM | POA: Diagnosis not present

## 2021-05-06 DIAGNOSIS — I25119 Atherosclerotic heart disease of native coronary artery with unspecified angina pectoris: Secondary | ICD-10-CM | POA: Diagnosis not present

## 2021-05-06 DIAGNOSIS — D509 Iron deficiency anemia, unspecified: Secondary | ICD-10-CM | POA: Diagnosis not present

## 2021-05-06 DIAGNOSIS — J9601 Acute respiratory failure with hypoxia: Secondary | ICD-10-CM | POA: Diagnosis not present

## 2021-05-06 DIAGNOSIS — I700 Atherosclerosis of aorta: Secondary | ICD-10-CM | POA: Diagnosis not present

## 2021-05-07 DIAGNOSIS — J441 Chronic obstructive pulmonary disease with (acute) exacerbation: Secondary | ICD-10-CM | POA: Diagnosis not present

## 2021-05-07 DIAGNOSIS — I480 Paroxysmal atrial fibrillation: Secondary | ICD-10-CM | POA: Diagnosis not present

## 2021-05-07 DIAGNOSIS — I25119 Atherosclerotic heart disease of native coronary artery with unspecified angina pectoris: Secondary | ICD-10-CM | POA: Diagnosis not present

## 2021-05-07 DIAGNOSIS — J9601 Acute respiratory failure with hypoxia: Secondary | ICD-10-CM | POA: Diagnosis not present

## 2021-05-07 DIAGNOSIS — I350 Nonrheumatic aortic (valve) stenosis: Secondary | ICD-10-CM | POA: Diagnosis not present

## 2021-05-07 DIAGNOSIS — M810 Age-related osteoporosis without current pathological fracture: Secondary | ICD-10-CM | POA: Diagnosis not present

## 2021-05-07 DIAGNOSIS — I700 Atherosclerosis of aorta: Secondary | ICD-10-CM | POA: Diagnosis not present

## 2021-05-07 DIAGNOSIS — D509 Iron deficiency anemia, unspecified: Secondary | ICD-10-CM | POA: Diagnosis not present

## 2021-05-07 DIAGNOSIS — R69 Illness, unspecified: Secondary | ICD-10-CM | POA: Diagnosis not present

## 2021-05-07 NOTE — Progress Notes (Signed)
Returning nurse call.

## 2021-05-08 ENCOUNTER — Telehealth: Payer: Self-pay | Admitting: Family Medicine

## 2021-05-08 DIAGNOSIS — M810 Age-related osteoporosis without current pathological fracture: Secondary | ICD-10-CM | POA: Diagnosis not present

## 2021-05-08 DIAGNOSIS — I700 Atherosclerosis of aorta: Secondary | ICD-10-CM | POA: Diagnosis not present

## 2021-05-08 DIAGNOSIS — I25119 Atherosclerotic heart disease of native coronary artery with unspecified angina pectoris: Secondary | ICD-10-CM | POA: Diagnosis not present

## 2021-05-08 DIAGNOSIS — D509 Iron deficiency anemia, unspecified: Secondary | ICD-10-CM | POA: Diagnosis not present

## 2021-05-08 DIAGNOSIS — I480 Paroxysmal atrial fibrillation: Secondary | ICD-10-CM | POA: Diagnosis not present

## 2021-05-08 DIAGNOSIS — J9601 Acute respiratory failure with hypoxia: Secondary | ICD-10-CM | POA: Diagnosis not present

## 2021-05-08 DIAGNOSIS — R69 Illness, unspecified: Secondary | ICD-10-CM | POA: Diagnosis not present

## 2021-05-08 DIAGNOSIS — I350 Nonrheumatic aortic (valve) stenosis: Secondary | ICD-10-CM | POA: Diagnosis not present

## 2021-05-08 DIAGNOSIS — J441 Chronic obstructive pulmonary disease with (acute) exacerbation: Secondary | ICD-10-CM | POA: Diagnosis not present

## 2021-05-08 NOTE — Telephone Encounter (Signed)
Order faxed to [facility] at [phone]. LMOVM to pt

## 2021-05-08 NOTE — Telephone Encounter (Signed)
Pt called to let us know that we need to send her order for compression stockings to [facility] because its the only place where her insurance will cover.  Phone # [phone]

## 2021-05-09 ENCOUNTER — Ambulatory Visit: Payer: Medicare HMO | Admitting: Family Medicine

## 2021-05-09 DIAGNOSIS — D509 Iron deficiency anemia, unspecified: Secondary | ICD-10-CM | POA: Diagnosis not present

## 2021-05-09 DIAGNOSIS — J9601 Acute respiratory failure with hypoxia: Secondary | ICD-10-CM | POA: Diagnosis not present

## 2021-05-09 DIAGNOSIS — M810 Age-related osteoporosis without current pathological fracture: Secondary | ICD-10-CM | POA: Diagnosis not present

## 2021-05-09 DIAGNOSIS — J441 Chronic obstructive pulmonary disease with (acute) exacerbation: Secondary | ICD-10-CM | POA: Diagnosis not present

## 2021-05-09 DIAGNOSIS — R69 Illness, unspecified: Secondary | ICD-10-CM | POA: Diagnosis not present

## 2021-05-09 DIAGNOSIS — I700 Atherosclerosis of aorta: Secondary | ICD-10-CM | POA: Diagnosis not present

## 2021-05-09 DIAGNOSIS — I480 Paroxysmal atrial fibrillation: Secondary | ICD-10-CM | POA: Diagnosis not present

## 2021-05-09 DIAGNOSIS — I25119 Atherosclerotic heart disease of native coronary artery with unspecified angina pectoris: Secondary | ICD-10-CM | POA: Diagnosis not present

## 2021-05-09 DIAGNOSIS — I350 Nonrheumatic aortic (valve) stenosis: Secondary | ICD-10-CM | POA: Diagnosis not present

## 2021-05-13 DIAGNOSIS — M810 Age-related osteoporosis without current pathological fracture: Secondary | ICD-10-CM | POA: Diagnosis not present

## 2021-05-13 DIAGNOSIS — D509 Iron deficiency anemia, unspecified: Secondary | ICD-10-CM | POA: Diagnosis not present

## 2021-05-13 DIAGNOSIS — J441 Chronic obstructive pulmonary disease with (acute) exacerbation: Secondary | ICD-10-CM | POA: Diagnosis not present

## 2021-05-13 DIAGNOSIS — R69 Illness, unspecified: Secondary | ICD-10-CM | POA: Diagnosis not present

## 2021-05-13 DIAGNOSIS — I25119 Atherosclerotic heart disease of native coronary artery with unspecified angina pectoris: Secondary | ICD-10-CM | POA: Diagnosis not present

## 2021-05-13 DIAGNOSIS — I700 Atherosclerosis of aorta: Secondary | ICD-10-CM | POA: Diagnosis not present

## 2021-05-13 DIAGNOSIS — I350 Nonrheumatic aortic (valve) stenosis: Secondary | ICD-10-CM | POA: Diagnosis not present

## 2021-05-13 DIAGNOSIS — J9601 Acute respiratory failure with hypoxia: Secondary | ICD-10-CM | POA: Diagnosis not present

## 2021-05-13 DIAGNOSIS — I480 Paroxysmal atrial fibrillation: Secondary | ICD-10-CM | POA: Diagnosis not present

## 2021-05-14 DIAGNOSIS — I480 Paroxysmal atrial fibrillation: Secondary | ICD-10-CM | POA: Diagnosis not present

## 2021-05-14 DIAGNOSIS — I25119 Atherosclerotic heart disease of native coronary artery with unspecified angina pectoris: Secondary | ICD-10-CM | POA: Diagnosis not present

## 2021-05-14 DIAGNOSIS — R69 Illness, unspecified: Secondary | ICD-10-CM | POA: Diagnosis not present

## 2021-05-14 DIAGNOSIS — I350 Nonrheumatic aortic (valve) stenosis: Secondary | ICD-10-CM | POA: Diagnosis not present

## 2021-05-14 DIAGNOSIS — D509 Iron deficiency anemia, unspecified: Secondary | ICD-10-CM | POA: Diagnosis not present

## 2021-05-14 DIAGNOSIS — M810 Age-related osteoporosis without current pathological fracture: Secondary | ICD-10-CM | POA: Diagnosis not present

## 2021-05-14 DIAGNOSIS — J441 Chronic obstructive pulmonary disease with (acute) exacerbation: Secondary | ICD-10-CM | POA: Diagnosis not present

## 2021-05-14 DIAGNOSIS — J9601 Acute respiratory failure with hypoxia: Secondary | ICD-10-CM | POA: Diagnosis not present

## 2021-05-14 DIAGNOSIS — I700 Atherosclerosis of aorta: Secondary | ICD-10-CM | POA: Diagnosis not present

## 2021-05-15 DIAGNOSIS — I700 Atherosclerosis of aorta: Secondary | ICD-10-CM | POA: Diagnosis not present

## 2021-05-15 DIAGNOSIS — R69 Illness, unspecified: Secondary | ICD-10-CM | POA: Diagnosis not present

## 2021-05-15 DIAGNOSIS — I350 Nonrheumatic aortic (valve) stenosis: Secondary | ICD-10-CM | POA: Diagnosis not present

## 2021-05-15 DIAGNOSIS — I25119 Atherosclerotic heart disease of native coronary artery with unspecified angina pectoris: Secondary | ICD-10-CM | POA: Diagnosis not present

## 2021-05-15 DIAGNOSIS — M810 Age-related osteoporosis without current pathological fracture: Secondary | ICD-10-CM | POA: Diagnosis not present

## 2021-05-15 DIAGNOSIS — D509 Iron deficiency anemia, unspecified: Secondary | ICD-10-CM | POA: Diagnosis not present

## 2021-05-15 DIAGNOSIS — J441 Chronic obstructive pulmonary disease with (acute) exacerbation: Secondary | ICD-10-CM | POA: Diagnosis not present

## 2021-05-15 DIAGNOSIS — J9601 Acute respiratory failure with hypoxia: Secondary | ICD-10-CM | POA: Diagnosis not present

## 2021-05-15 DIAGNOSIS — I480 Paroxysmal atrial fibrillation: Secondary | ICD-10-CM | POA: Diagnosis not present

## 2021-05-16 DIAGNOSIS — I890 Lymphedema, not elsewhere classified: Secondary | ICD-10-CM | POA: Diagnosis not present

## 2021-05-16 DIAGNOSIS — I25119 Atherosclerotic heart disease of native coronary artery with unspecified angina pectoris: Secondary | ICD-10-CM | POA: Diagnosis not present

## 2021-05-16 DIAGNOSIS — M810 Age-related osteoporosis without current pathological fracture: Secondary | ICD-10-CM | POA: Diagnosis not present

## 2021-05-16 DIAGNOSIS — I350 Nonrheumatic aortic (valve) stenosis: Secondary | ICD-10-CM | POA: Diagnosis not present

## 2021-05-16 DIAGNOSIS — J9601 Acute respiratory failure with hypoxia: Secondary | ICD-10-CM | POA: Diagnosis not present

## 2021-05-16 DIAGNOSIS — J441 Chronic obstructive pulmonary disease with (acute) exacerbation: Secondary | ICD-10-CM | POA: Diagnosis not present

## 2021-05-16 DIAGNOSIS — D509 Iron deficiency anemia, unspecified: Secondary | ICD-10-CM | POA: Diagnosis not present

## 2021-05-16 DIAGNOSIS — R69 Illness, unspecified: Secondary | ICD-10-CM | POA: Diagnosis not present

## 2021-05-16 DIAGNOSIS — I480 Paroxysmal atrial fibrillation: Secondary | ICD-10-CM | POA: Diagnosis not present

## 2021-05-16 DIAGNOSIS — I700 Atherosclerosis of aorta: Secondary | ICD-10-CM | POA: Diagnosis not present

## 2021-05-20 DIAGNOSIS — I700 Atherosclerosis of aorta: Secondary | ICD-10-CM | POA: Diagnosis not present

## 2021-05-20 DIAGNOSIS — R69 Illness, unspecified: Secondary | ICD-10-CM | POA: Diagnosis not present

## 2021-05-20 DIAGNOSIS — I480 Paroxysmal atrial fibrillation: Secondary | ICD-10-CM | POA: Diagnosis not present

## 2021-05-20 DIAGNOSIS — I350 Nonrheumatic aortic (valve) stenosis: Secondary | ICD-10-CM | POA: Diagnosis not present

## 2021-05-20 DIAGNOSIS — D509 Iron deficiency anemia, unspecified: Secondary | ICD-10-CM | POA: Diagnosis not present

## 2021-05-20 DIAGNOSIS — M810 Age-related osteoporosis without current pathological fracture: Secondary | ICD-10-CM | POA: Diagnosis not present

## 2021-05-20 DIAGNOSIS — I25119 Atherosclerotic heart disease of native coronary artery with unspecified angina pectoris: Secondary | ICD-10-CM | POA: Diagnosis not present

## 2021-05-20 DIAGNOSIS — J9601 Acute respiratory failure with hypoxia: Secondary | ICD-10-CM | POA: Diagnosis not present

## 2021-05-20 DIAGNOSIS — J441 Chronic obstructive pulmonary disease with (acute) exacerbation: Secondary | ICD-10-CM | POA: Diagnosis not present

## 2021-05-21 ENCOUNTER — Other Ambulatory Visit: Payer: Self-pay | Admitting: Family Medicine

## 2021-05-21 DIAGNOSIS — I480 Paroxysmal atrial fibrillation: Secondary | ICD-10-CM | POA: Diagnosis not present

## 2021-05-21 DIAGNOSIS — I25119 Atherosclerotic heart disease of native coronary artery with unspecified angina pectoris: Secondary | ICD-10-CM | POA: Diagnosis not present

## 2021-05-21 DIAGNOSIS — I350 Nonrheumatic aortic (valve) stenosis: Secondary | ICD-10-CM | POA: Diagnosis not present

## 2021-05-21 DIAGNOSIS — I700 Atherosclerosis of aorta: Secondary | ICD-10-CM | POA: Diagnosis not present

## 2021-05-21 DIAGNOSIS — D509 Iron deficiency anemia, unspecified: Secondary | ICD-10-CM | POA: Diagnosis not present

## 2021-05-21 DIAGNOSIS — M810 Age-related osteoporosis without current pathological fracture: Secondary | ICD-10-CM | POA: Diagnosis not present

## 2021-05-21 DIAGNOSIS — J441 Chronic obstructive pulmonary disease with (acute) exacerbation: Secondary | ICD-10-CM | POA: Diagnosis not present

## 2021-05-21 DIAGNOSIS — J9601 Acute respiratory failure with hypoxia: Secondary | ICD-10-CM | POA: Diagnosis not present

## 2021-05-21 DIAGNOSIS — R69 Illness, unspecified: Secondary | ICD-10-CM | POA: Diagnosis not present

## 2021-05-22 DIAGNOSIS — I480 Paroxysmal atrial fibrillation: Secondary | ICD-10-CM | POA: Diagnosis not present

## 2021-05-22 DIAGNOSIS — I25119 Atherosclerotic heart disease of native coronary artery with unspecified angina pectoris: Secondary | ICD-10-CM | POA: Diagnosis not present

## 2021-05-22 DIAGNOSIS — I350 Nonrheumatic aortic (valve) stenosis: Secondary | ICD-10-CM | POA: Diagnosis not present

## 2021-05-22 DIAGNOSIS — R69 Illness, unspecified: Secondary | ICD-10-CM | POA: Diagnosis not present

## 2021-05-22 DIAGNOSIS — J9601 Acute respiratory failure with hypoxia: Secondary | ICD-10-CM | POA: Diagnosis not present

## 2021-05-22 DIAGNOSIS — D509 Iron deficiency anemia, unspecified: Secondary | ICD-10-CM | POA: Diagnosis not present

## 2021-05-22 DIAGNOSIS — I700 Atherosclerosis of aorta: Secondary | ICD-10-CM | POA: Diagnosis not present

## 2021-05-22 DIAGNOSIS — J441 Chronic obstructive pulmonary disease with (acute) exacerbation: Secondary | ICD-10-CM | POA: Diagnosis not present

## 2021-05-22 DIAGNOSIS — M810 Age-related osteoporosis without current pathological fracture: Secondary | ICD-10-CM | POA: Diagnosis not present

## 2021-05-27 DIAGNOSIS — I25119 Atherosclerotic heart disease of native coronary artery with unspecified angina pectoris: Secondary | ICD-10-CM | POA: Diagnosis not present

## 2021-05-27 DIAGNOSIS — R69 Illness, unspecified: Secondary | ICD-10-CM | POA: Diagnosis not present

## 2021-05-27 DIAGNOSIS — I350 Nonrheumatic aortic (valve) stenosis: Secondary | ICD-10-CM | POA: Diagnosis not present

## 2021-05-27 DIAGNOSIS — D509 Iron deficiency anemia, unspecified: Secondary | ICD-10-CM | POA: Diagnosis not present

## 2021-05-27 DIAGNOSIS — I480 Paroxysmal atrial fibrillation: Secondary | ICD-10-CM | POA: Diagnosis not present

## 2021-05-27 DIAGNOSIS — I700 Atherosclerosis of aorta: Secondary | ICD-10-CM | POA: Diagnosis not present

## 2021-05-27 DIAGNOSIS — M810 Age-related osteoporosis without current pathological fracture: Secondary | ICD-10-CM | POA: Diagnosis not present

## 2021-05-27 DIAGNOSIS — J441 Chronic obstructive pulmonary disease with (acute) exacerbation: Secondary | ICD-10-CM | POA: Diagnosis not present

## 2021-05-27 DIAGNOSIS — J9601 Acute respiratory failure with hypoxia: Secondary | ICD-10-CM | POA: Diagnosis not present

## 2021-05-29 ENCOUNTER — Other Ambulatory Visit: Payer: Self-pay

## 2021-05-29 ENCOUNTER — Ambulatory Visit (INDEPENDENT_AMBULATORY_CARE_PROVIDER_SITE_OTHER): Payer: Medicare HMO

## 2021-05-29 DIAGNOSIS — I471 Supraventricular tachycardia: Secondary | ICD-10-CM

## 2021-05-29 DIAGNOSIS — J9601 Acute respiratory failure with hypoxia: Secondary | ICD-10-CM | POA: Diagnosis not present

## 2021-05-29 DIAGNOSIS — K449 Diaphragmatic hernia without obstruction or gangrene: Secondary | ICD-10-CM

## 2021-05-29 DIAGNOSIS — M810 Age-related osteoporosis without current pathological fracture: Secondary | ICD-10-CM

## 2021-05-29 DIAGNOSIS — E782 Mixed hyperlipidemia: Secondary | ICD-10-CM

## 2021-05-29 DIAGNOSIS — K219 Gastro-esophageal reflux disease without esophagitis: Secondary | ICD-10-CM

## 2021-05-29 DIAGNOSIS — I700 Atherosclerosis of aorta: Secondary | ICD-10-CM | POA: Diagnosis not present

## 2021-05-29 DIAGNOSIS — I25119 Atherosclerotic heart disease of native coronary artery with unspecified angina pectoris: Secondary | ICD-10-CM

## 2021-05-29 DIAGNOSIS — M545 Low back pain, unspecified: Secondary | ICD-10-CM

## 2021-05-29 DIAGNOSIS — J441 Chronic obstructive pulmonary disease with (acute) exacerbation: Secondary | ICD-10-CM

## 2021-05-29 DIAGNOSIS — I480 Paroxysmal atrial fibrillation: Secondary | ICD-10-CM

## 2021-05-29 DIAGNOSIS — R69 Illness, unspecified: Secondary | ICD-10-CM | POA: Diagnosis not present

## 2021-05-29 DIAGNOSIS — F339 Major depressive disorder, recurrent, unspecified: Secondary | ICD-10-CM

## 2021-05-29 DIAGNOSIS — H3530 Unspecified macular degeneration: Secondary | ICD-10-CM

## 2021-05-29 DIAGNOSIS — M533 Sacrococcygeal disorders, not elsewhere classified: Secondary | ICD-10-CM

## 2021-05-29 DIAGNOSIS — I351 Nonrheumatic aortic (valve) insufficiency: Secondary | ICD-10-CM

## 2021-05-29 DIAGNOSIS — H548 Legal blindness, as defined in USA: Secondary | ICD-10-CM

## 2021-05-29 DIAGNOSIS — D509 Iron deficiency anemia, unspecified: Secondary | ICD-10-CM | POA: Diagnosis not present

## 2021-05-29 DIAGNOSIS — F411 Generalized anxiety disorder: Secondary | ICD-10-CM

## 2021-05-29 DIAGNOSIS — I350 Nonrheumatic aortic (valve) stenosis: Secondary | ICD-10-CM | POA: Diagnosis not present

## 2021-05-29 DIAGNOSIS — G5601 Carpal tunnel syndrome, right upper limb: Secondary | ICD-10-CM

## 2021-06-03 ENCOUNTER — Ambulatory Visit (INDEPENDENT_AMBULATORY_CARE_PROVIDER_SITE_OTHER): Payer: Medicare HMO | Admitting: Family Medicine

## 2021-06-03 ENCOUNTER — Encounter: Payer: Self-pay | Admitting: Family Medicine

## 2021-06-03 ENCOUNTER — Other Ambulatory Visit: Payer: Self-pay

## 2021-06-03 VITALS — BP 133/69 | HR 98 | Ht 63.0 in | Wt 168.0 lb

## 2021-06-03 DIAGNOSIS — N644 Mastodynia: Secondary | ICD-10-CM

## 2021-06-03 DIAGNOSIS — J449 Chronic obstructive pulmonary disease, unspecified: Secondary | ICD-10-CM

## 2021-06-03 MED ORDER — BREZTRI AEROSPHERE 160-9-4.8 MCG/ACT IN AERO: 1.0000 | INHALATION_SPRAY | Freq: Two times a day (BID) | RESPIRATORY_TRACT | 11 refills | Status: DC

## 2021-06-03 NOTE — Progress Notes (Signed)
BP 133/69   Pulse 98   Ht 5\' 3"  (1.6 m)   Wt 168 lb (76.2 kg)   SpO2 94%   BMI 29.76 kg/m    Subjective:   Patient ID: Krystal [name], female    DOB: Nov 18, 1940, 80 y.o.   MRN: [id number]  HPI: Krystal [name] is a 80 y.o. female presenting on 06/03/2021 for Medical Management of Chronic Issues, COPD, and Breast Pain (Left- denies mass, nipple changes)   HPI Patient comes in today with left breast pain that just started over yesterday.  She denies any trauma or hitting her chest or falling.  She denies any bruising or skin changes.  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  She had a mammogram just under a year ago.  She denies any nodules or anything new in the left breast  COPD emphysema Patient is coming in for COPD recheck today.  He is currently on breztri and albuterol.  He has a mild chronic cough but denies any major coughing spells or wheezing spells.  He has 1nighttime symptoms per week and 0daytime symptoms per week currently.  She feels like she doing a lot better on the medicine and is lumbar stable.  Relevant past medical, surgical, family and social history reviewed and updated as indicated. Interim medical history since our last visit reviewed. Allergies and medications reviewed and updated.  Review of Systems  Constitutional:  Negative for chills and fever.  Respiratory:  Positive for cough. Negative for chest tightness, shortness of breath and wheezing.   Cardiovascular:  Negative for chest pain and leg swelling.  Genitourinary:  Negative for difficulty urinating and dysuria.  Musculoskeletal:  Negative for back pain and gait problem.  Skin:  Negative for rash.  Neurological:  Negative for light-headedness and headaches.  Psychiatric/Behavioral:  Negative for agitation and behavioral problems.   All other systems reviewed and are negative.  Per HPI unless specifically indicated  above   Allergies as of 06/03/2021       Reactions   Actonel [risedronate] Nausea And Vomiting, Other (See Comments)   Throat tightness   Fosamax [alendronate Sodium] Other (See Comments)   esophagitis   Mevacor [lovastatin] Other (See Comments)   myalgias   Miacalcin [calcitonin (salmon)] Nausea And Vomiting   Mobic [meloxicam] Hives, Itching   rash        Medication List        Accurate as of June 03, 2021  2:16 PM. If you have any questions, ask your nurse or doctor.          albuterol 108 (90 Base) MCG/ACT inhaler Commonly known as: VENTOLIN HFA INHALE TWO PUFFS BY MOUTH 4 TIMES DAILY AS NEEDED What changed: See the new instructions.   apixaban 5 MG Tabs tablet Commonly known as: ELIQUIS Take 1 tablet (5 mg total) by mouth 2 (two) times daily.   Breztri Aerosphere 160-9-4.8 MCG/ACT Aero Generic drug: Budeson-Glycopyrrol-Formoterol Inhale 1 puff into the lungs 2 (two) times daily.   Calcium Citrate-Vitamin D 315-250 MG-UNIT Tabs Commonly known as: Calcium Citrate + D Take 2 tablets by mouth daily.   cetirizine 10 MG tablet Commonly known as: ZYRTEC Take 5-10 mg by mouth at bedtime.   dexamethasone 2 MG tablet Commonly known as: DECADRON Take 4 tablets for 3 days then 3 tablets for 3 days then 2 tablets for 3 days then 1 tablet for 3  days   diclofenac Sodium 1 % Gel Commonly known as: Voltaren Apply 2 g topically 4 (four) times daily.   diltiazem 300 MG 24 hr capsule Commonly known as: CARDIZEM CD Take 1 capsule (300 mg total) by mouth daily.   esomeprazole 40 MG capsule Commonly known as: NEXIUM TAKE 1 CAPSULE BY MOUTH EVERY DAY   ferrous sulfate 325 (65 FE) MG tablet Take 1 tablet (325 mg total) by mouth at bedtime.   ipratropium-albuterol 0.5-2.5 (3) MG/3ML Soln Commonly known as: DUONEB TAKE 3 MLS BY NEBULIZATION EVERY 6 (SIX) HOURS AS NEEDED. DX: ASTHMA 493.90   metoprolol tartrate 25 MG tablet Commonly known as: LOPRESSOR TAKE 1  TABLET (25 MG TOTAL) BY MOUTH DAILY AS NEEDED.   montelukast 10 MG tablet Commonly known as: SINGULAIR One tablet by mouth daily at bedtime   nitroGLYCERIN 0.4 MG/SPRAY spray Commonly known as: NITROLINGUAL ONE SPRAY UNDER TONGUE AS NEED FOR CHESTPAIN. MAY REPEAT IN 5 MINUTES IF PAIN NOT RELIEVED CALL 911.   PRESERVISION/LUTEIN PO Take 1 capsule by mouth at bedtime.   rosuvastatin 20 MG tablet Commonly known as: CRESTOR Take 1 tablet (20 mg total) by mouth daily.   Systane 0.4-0.3 % Soln Generic drug: Polyethyl Glycol-Propyl Glycol Place 1 drop into both eyes 2 (two) times daily.   Vitamin D 50 MCG (2000 UT) Caps Take 1 capsule by mouth at bedtime.         Objective:   BP 133/69   Pulse 98   Ht 5\' 3"  (1.6 m)   Wt 168 lb (76.2 kg)   SpO2 94%   BMI 29.76 kg/m   Wt Readings from Last 3 Encounters:  06/03/21 168 lb (76.2 kg)  05/02/21 170 lb (77.1 kg)  04/24/21 175 lb 7.8 oz (79.6 kg)    Physical Exam Vitals and nursing note reviewed. Exam conducted with a chaperone present.  Constitutional:      General: She is not in acute distress.    Appearance: She is well-developed. She is not diaphoretic.  Eyes:     Conjunctiva/sclera: Conjunctivae normal.  Cardiovascular:     Rate and Rhythm: Normal rate and regular rhythm.     Heart sounds: Normal heart sounds. No murmur heard. Pulmonary:     Effort: Pulmonary effort is normal. No respiratory distress.     Breath sounds: Normal breath sounds. No wheezing.  Chest:  Breasts:    Right: Absent.     Left: Inverted nipple and tenderness present. No mass, nipple discharge or skin change.  Musculoskeletal:        General: No tenderness. Normal range of motion.  Skin:    General: Skin is warm and dry.     Findings: No rash.  Neurological:     Mental Status: She is alert and oriented to person, place, and time.     Coordination: Coordination normal.  Psychiatric:        Behavior: Behavior normal.      Assessment &  Plan:   Problem List Items Addressed This Visit   None Visit Diagnoses     COPD mixed type ([place])    -  Primary   Relevant Medications   Budeson-Glycopyrrol-Formoterol (BREZTRI AEROSPHERE) 160-9-4.8 MCG/ACT AERO   Pain of left breast       Relevant Orders   MM Digital Diagnostic [facility]       Will order mammogram diagnostic for left breast pain  COPD under control, continue current medicine. Follow up plan: Return in about  3 months (around 09/03/2021), or if symptoms worsen or fail to improve, for COPD recheck.  Counseling provided for all of the vaccine components Orders Placed This Encounter  Procedures   MM Digital Diagnostic [place] Sheritta Deeg, MD [facility] 06/03/2021, 2:16 PM

## 2021-06-04 ENCOUNTER — Other Ambulatory Visit: Payer: Self-pay

## 2021-06-04 DIAGNOSIS — N644 Mastodynia: Secondary | ICD-10-CM

## 2021-06-05 DIAGNOSIS — J9601 Acute respiratory failure with hypoxia: Secondary | ICD-10-CM | POA: Diagnosis not present

## 2021-06-05 DIAGNOSIS — M810 Age-related osteoporosis without current pathological fracture: Secondary | ICD-10-CM | POA: Diagnosis not present

## 2021-06-05 DIAGNOSIS — J441 Chronic obstructive pulmonary disease with (acute) exacerbation: Secondary | ICD-10-CM | POA: Diagnosis not present

## 2021-06-05 DIAGNOSIS — I700 Atherosclerosis of aorta: Secondary | ICD-10-CM | POA: Diagnosis not present

## 2021-06-05 DIAGNOSIS — I350 Nonrheumatic aortic (valve) stenosis: Secondary | ICD-10-CM | POA: Diagnosis not present

## 2021-06-05 DIAGNOSIS — I25119 Atherosclerotic heart disease of native coronary artery with unspecified angina pectoris: Secondary | ICD-10-CM | POA: Diagnosis not present

## 2021-06-05 DIAGNOSIS — R69 Illness, unspecified: Secondary | ICD-10-CM | POA: Diagnosis not present

## 2021-06-05 DIAGNOSIS — I480 Paroxysmal atrial fibrillation: Secondary | ICD-10-CM | POA: Diagnosis not present

## 2021-06-05 DIAGNOSIS — D509 Iron deficiency anemia, unspecified: Secondary | ICD-10-CM | POA: Diagnosis not present

## 2021-06-12 NOTE — Telephone Encounter (Signed)
PA completed over phone with [employer]. PA approved 06/12/21-06/12/22 Pre-Authorization code is [id number]. Prolia sent to [facility]

## 2021-06-13 ENCOUNTER — Telehealth: Payer: Self-pay | Admitting: Family Medicine

## 2021-06-13 NOTE — Telephone Encounter (Signed)
Order was sent to [facility] on 11/1 for pt to have US done on her LT Breast and pt called to get update on status of scheduling because she has not heard from anyone.  Please advise and call patient.

## 2021-06-14 NOTE — Telephone Encounter (Signed)
Pt calling to check on appt for mammogram. Please call back

## 2021-06-17 ENCOUNTER — Telehealth: Payer: Self-pay | Admitting: Family Medicine

## 2021-06-17 NOTE — Telephone Encounter (Signed)
REFERRAL REQUEST Telephone Note  Have you been seen at our office for this problem? no (Advise that they may need an appointment with their PCP before a referral can be done)  Reason for Referral: Mammogram Referral discussed with patient: yes  Best contact number of patient for referral team: [phone]    Has patient been seen by a specialist for this issue before: yes  Patient provider preference for referral: [facility] Patient location preference for referral: [place]   Patient notified that referrals can take up to a week or longer to process. If they haven't heard anything within a week they should call back and speak with the referral department.    [facility] pt.  She has been waiting 14 days for referral. [place] call pt.

## 2021-06-18 NOTE — Progress Notes (Deleted)
Cardiology Office Note   Date:  06/18/2021   ID:  Makailah, Slavick Dec 18, 1940, MRN [id number]  PCP:  Dettinger, Fransisca Kaufmann, MD  Cardiologist:   Minus Breeding, MD   No chief complaint on file.     History of Present Illness: Krystal [name] is a 80 y.o. female who presents for follow up of SVT.   I saw her in July and she had some dyspnea.  She had a negative Lexiscan Myoview.    She had normal LV function on echo recently.  ProBNP was normal.  She continued to have pain and had a coronary CTA recently.  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  It was decided to manage her medically.   She did have a large hiatal hernia.    She had chest pain and SOB and I sent her for a cath and she had no obstructive CAD.  ***   ***   ***She called recently because of ongoing dyspnea and chest pain.  This happened about 10 days ago.  She had severe pain in her mid chest.  It was time.  It did go away after sublingual nitroglycerin spray but it lasted total about 30 minutes.  It was severe.  There was no radiation to her jaw or to her arms.  It happened at night and woke her up.  The first time that this woke her from her sleep.  She is able to do some activities and she gets short of breath with this but she is not describing necessarily reproducible chest discomfort.  Her pain seems to come on sporadically.  Past Medical History:  Diagnosis Date   Anemia    Asthma    Brain tumor ([place])    1986   Breast cancer ([place])    Right breast   Cataract    Colon polyps    COPD (chronic obstructive pulmonary disease) (H[facility])    Hiatal hernia    Hyperlipidemia    Leg edema    Macular degeneration    Osteoporosis    Pneumonia    PVD (posterior vitreous detachment)    Right knee pain    Shingles rash     Past Surgical History:  Procedure Laterality Date   CATARACT EXTRACTION, BILATERAL     COLONOSCOPY     CORONARY PRESSURE WIRE/FFR WITH 3D MAPPING N/A 03/21/2021    Procedure: Coronary Pressure Wire/FFR w/3D Mapping;  Surgeon: Nelva Bush, MD;  Location: [facility];  Service: Cardiovascular;  Laterality: N/A;   CRANIECTOMY / CRANIOTOMY FOR EXCISION OF BRAIN TUMOR     Left side brain   LEFT HEART CATH AND CORONARY ANGIOGRAPHY N/A 03/21/2021   Procedure: LEFT HEART CATH AND CORONARY ANGIOGRAPHY;  Surgeon: Nelva Bush, MD;  Location: [facility];  Service: Cardiovascular;  Laterality: N/A;   MASTECTOMY Right    PARTIAL HYSTERECTOMY     POLYPECTOMY     SKIN CANCER EXCISION Left    Under left eye     Current Outpatient Medications  Medication Sig Dispense Refill   albuterol (VENTOLIN HFA) 108 (90 Base) MCG/ACT inhaler INHALE TWO PUFFS BY MOUTH 4 TIMES DAILY AS NEEDED (Patient taking differently: Inhale 2 puffs into the lungs every 4 (four) hours as needed for wheezing or shortness of breath.) 18 each 2   apixaban (ELIQUIS) 5 MG TABS tablet Take 1 tablet (5 mg total) by mouth 2 (two) times daily. 180 tablet 3  Budeson-Glycopyrrol-Formoterol (BREZTRI AEROSPHERE) 160-9-4.8 MCG/ACT AERO Inhale 1 puff into the lungs 2 (two) times daily. 10.7 g 11   Calcium Citrate-Vitamin D (CALCIUM CITRATE + D) 315-250 MG-UNIT TABS Take 2 tablets by mouth daily. 120 tablet    cetirizine (ZYRTEC) 10 MG tablet Take 5-10 mg by mouth at bedtime.      Cholecalciferol (VITAMIN D) 2000 UNITS CAPS Take 1 capsule by mouth at bedtime.     denosumab (PROLIA) 60 MG/ML SOSY injection TO BE ADMINISTERED IN PHYSICIAN'S OFFICE. INJECT ONE SYRINGE SUBCOUSLY ONCE EVERY 6 MONTHS. REFRIGERATE. USE WITHIN 14 DAYS ONCE AT ROOM TEMPERATURE. 1 mL 1   dexamethasone (DECADRON) 2 MG tablet Take 4 tablets for 3 days then 3 tablets for 3 days then 2 tablets for 3 days then 1 tablet for 3 days 30 tablet 0   diclofenac Sodium (VOLTAREN) 1 % GEL Apply 2 g topically 4 (four) times daily. 350 g 3   diltiazem (CARDIZEM CD) 300 MG 24 hr capsule Take 1 capsule (300 mg total) by mouth daily.  90 capsule 3   esomeprazole (NEXIUM) 40 MG capsule TAKE 1 CAPSULE BY MOUTH EVERY DAY 90 capsule 3   ferrous sulfate 325 (65 FE) MG tablet Take 1 tablet (325 mg total) by mouth at bedtime. 90 tablet 3   ipratropium-albuterol (DUONEB) 0.5-2.5 (3) MG/3ML SOLN TAKE 3 MLS BY NEBULIZATION EVERY 6 (SIX) HOURS AS NEEDED. DX: ASTHMA 493.90 360 mL 0   metoprolol tartrate (LOPRESSOR) 25 MG tablet TAKE 1 TABLET (25 MG TOTAL) BY MOUTH DAILY AS NEEDED. 90 tablet 3   montelukast (SINGULAIR) 10 MG tablet One tablet by mouth daily at bedtime 90 tablet 3   Multiple Vitamins-Minerals (PRESERVISION/LUTEIN PO) Take 1 capsule by mouth at bedtime.      nitroGLYCERIN (NITROLINGUAL) 0.4 MG/SPRAY spray ONE SPRAY UNDER TONGUE AS NEED FOR CHESTPAIN. MAY REPEAT IN 5 MINUTES IF PAIN NOT RELIEVED CALL 911. 12 g 3   Polyethyl Glycol-Propyl Glycol (SYSTANE) 0.4-0.3 % SOLN Place 1 drop into both eyes 2 (two) times daily.     rosuvastatin (CRESTOR) 20 MG tablet Take 1 tablet (20 mg total) by mouth daily. 90 tablet 1   No current facility-administered medications for this visit.    Allergies:   Actonel [risedronate], Fosamax [alendronate sodium], Mevacor [lovastatin], Miacalcin [calcitonin (salmon)], and Mobic [meloxicam]    ROS:  Please see the history of present illness.   Otherwise, review of systems are positive for ***.   All other systems are reviewed and negative.    PHYSICAL EXAM: VS:  There were no vitals taken for this visit. , BMI There is no height or weight on file to calculate BMI. GENERAL:  Well appearing NECK:  No jugular venous distention, waveform within normal limits, carotid upstroke brisk and symmetric, no bruits, no thyromegaly LUNGS:  Clear to auscultation bilaterally CHEST:  Unremarkable HEART:  PMI not displaced or sustained,S1 and S2 within normal limits, no S3, no S4, no clicks, no rubs, *** murmurs ABD:  Flat, positive bowel sounds normal in frequency in pitch, no bruits, no rebound, no guarding,  no midline pulsatile mass, no hepatomegaly, no splenomegaly EXT:  2 plus pulses throughout, no edema, no cyanosis no clubbing    *** GENERAL:  Well appearing NECK:  No jugular venous distention, waveform within normal limits, carotid upstroke brisk and symmetric, no bruits, no thyromegaly LUNGS:  Clear to auscultation bilaterally CHEST:  Unremarkable HEART:  PMI not displaced or sustained,S1 and S2 within normal limits, no  S3, no S4, no clicks, no rubs, no murmurs ABD:  Flat, positive bowel sounds normal in frequency in pitch, no bruits, no rebound, no guarding, no midline pulsatile mass, no hepatomegaly, no splenomegaly EXT:  2 plus pulses throughout, no edema, no cyanosis no clubbing   EKG:  EKG is  *** ordered today. Sinus rhythm, rate ***, axis within normal limits, intervals within normal limits, no acute ST-T wave changes.  Recent Labs: 03/22/2021: TSH 3.542 04/20/2021: B Natriuretic Peptide 137.0 04/24/2021: Magnesium 2.4 05/02/2021: ALT 23; BUN 22; Creatinine, Ser 0.88; Hemoglobin 13.9; Platelets 183; Potassium 3.7; Sodium 143    Lipid Panel    Component Value Date/Time   CHOL 134 03/22/2021 0148   CHOL 226 (H) 11/05/2020 1416   CHOL 152 03/04/2013 1212   TRIG 83 03/22/2021 0148   TRIG 93 04/08/2016 1158   TRIG 97 03/04/2013 1212   HDL 55 03/22/2021 0148   HDL 57 11/05/2020 1416   HDL 68 04/08/2016 1158   HDL 62 03/04/2013 1212   CHOLHDL 2.4 03/22/2021 0148   VLDL 17 03/22/2021 0148   LDLCALC 62 03/22/2021 0148   LDLCALC 134 (H) 11/05/2020 1416   LDLCALC 75 01/27/2014 1147   LDLCALC 71 03/04/2013 1212      Wt Readings from Last 3 Encounters:  06/03/21 168 lb (76.2 kg)  05/02/21 170 lb (77.1 kg)  04/24/21 175 lb 7.8 oz (79.6 kg)    Diagnostic  Cath  Dominance: Right   Additional studies/ records that were reviewed today include:  ***  Review of the above records demonstrates:  See above.   ASSESSMENT AND PLAN:  SVT:  ***   She had no symptoms related to  this.  No change in therapy.  CHEST PAIN:   ***  She has nonobstructive and distal vessel CAD.  She did have a large hiatal hernia.   She did have obstructive distal vessel LAD disease and mild disease elsewhere.  Given the increase in her symptoms symptomatically now having rest symptoms cardiac cath is indicated.  If there is no high-grade obstructive disease requires angioplasty this likely would be a GI source and she might need to be referred particularly given the hiatal hernia.  I would like her to also get a right heart catheterization.  This is to evaluate the shortness of breath.  The patient understands that risks included but are not limited to stroke (1 in 1000), death (1 in 15), kidney failure [usually temporary] (1 in 500), bleeding (1 in 200), allergic reaction [possibly serious] (1 in 200).  The patient understands and agrees to proceed.   DYSPNEA:    *** This will be evaluated as above.  She does have a large hiatal hernia which could be contributing.     Current medicines are reviewed at length with the patient today.  The patient does not have concerns regarding medicines.  The following changes have been made:   ***  Labs/ tests ordered today include:  ***  No orders of the defined types were placed in this encounter.    Disposition:   FU with me ***  Signed, Minus Breeding, MD  06/18/2021 8:24 PM    [place]

## 2021-06-18 NOTE — Telephone Encounter (Signed)
Orders have been faxed mult times - Patient given number to call and make appt

## 2021-06-19 ENCOUNTER — Other Ambulatory Visit: Payer: Self-pay

## 2021-06-19 ENCOUNTER — Ambulatory Visit: Payer: Medicare HMO | Admitting: Cardiology

## 2021-06-19 DIAGNOSIS — R0602 Shortness of breath: Secondary | ICD-10-CM

## 2021-06-19 DIAGNOSIS — I471 Supraventricular tachycardia: Secondary | ICD-10-CM

## 2021-06-19 DIAGNOSIS — R072 Precordial pain: Secondary | ICD-10-CM

## 2021-07-03 ENCOUNTER — Ambulatory Visit (HOSPITAL_COMMUNITY)
Admission: RE | Admit: 2021-07-03 | Discharge: 2021-07-03 | Disposition: A | Discharge status: Home / Self Care | Payer: Medicare HMO | Source: Ambulatory Visit | Attending: Family Medicine | Admitting: Family Medicine

## 2021-07-03 ENCOUNTER — Other Ambulatory Visit: Payer: Self-pay

## 2021-07-03 DIAGNOSIS — N644 Mastodynia: Secondary | ICD-10-CM | POA: Insufficient documentation

## 2021-07-03 DIAGNOSIS — R922 Inconclusive mammogram: Secondary | ICD-10-CM | POA: Diagnosis not present

## 2021-07-04 ENCOUNTER — Telehealth: Payer: Self-pay | Admitting: *Deleted

## 2021-07-04 NOTE — Telephone Encounter (Signed)
I called patient to follow up on her Prolia. Patient states that she was advised by the heart doctor in the hospital to not take anymore. Just wanted to let you know.

## 2021-07-22 DIAGNOSIS — H43813 Vitreous degeneration, bilateral: Secondary | ICD-10-CM | POA: Diagnosis not present

## 2021-07-22 DIAGNOSIS — H353211 Exudative age-related macular degeneration, right eye, with active choroidal neovascularization: Secondary | ICD-10-CM | POA: Diagnosis not present

## 2021-07-22 DIAGNOSIS — H35373 Puckering of macula, bilateral: Secondary | ICD-10-CM | POA: Diagnosis not present

## 2021-07-22 DIAGNOSIS — H353124 Nonexudative age-related macular degeneration, left eye, advanced atrophic with subfoveal involvement: Secondary | ICD-10-CM | POA: Diagnosis not present

## 2021-07-24 ENCOUNTER — Other Ambulatory Visit: Payer: Self-pay | Admitting: Family Medicine

## 2021-08-08 DIAGNOSIS — R051 Acute cough: Secondary | ICD-10-CM | POA: Diagnosis not present

## 2021-08-08 DIAGNOSIS — J329 Chronic sinusitis, unspecified: Secondary | ICD-10-CM | POA: Diagnosis not present

## 2021-08-09 ENCOUNTER — Ambulatory Visit (INDEPENDENT_AMBULATORY_CARE_PROVIDER_SITE_OTHER): Payer: Medicare HMO

## 2021-08-09 VITALS — Ht 63.0 in | Wt 170.0 lb

## 2021-08-09 DIAGNOSIS — Z0000 Encounter for general adult medical examination without abnormal findings: Secondary | ICD-10-CM

## 2021-08-09 NOTE — Progress Notes (Signed)
Subjective:   Krystal [name] is a 81 y.o. female who presents for Medicare Annual (Subsequent) preventive examination.  Virtual Visit via Telephone Note  I connected with  Krystal [name] on 08/09/21 at  2:00 PM EST by telephone and verified that I am speaking with the correct person using two identifiers.  Location: Patient: Home Provider: [facility] Persons participating in the virtual visit: patient/Nurse Health Advisor   I discussed the limitations, risks, security and privacy concerns of performing an evaluation and management service by telephone and the availability of in person appointments. The patient expressed understanding and agreed to proceed.  Interactive audio and video telecommunications were attempted between this nurse and patient, however failed, due to patient having technical difficulties OR patient did not have access to video capability.  We continued and completed visit with audio only.  Some vital signs may be absent or patient reported.   Jerald Hennington E Janecia Palau, LPN   Review of Systems     Cardiac Risk Factors include: advanced age (>44men, >15 women);sedentary lifestyle;obesity (BMI >30kg/m2);dyslipidemia;hypertension;Other (see comment), Risk factor comments: CAD, A.Fib, COPD     Objective:    Today's Vitals   08/09/21 1404 08/09/21 1405  Weight: 170 lb (77.1 kg)   Height: 5\' 3"  (1.6 m)   PainSc:  4    Body mass index is 30.11 kg/m.  Advanced Directives 04/21/2021 04/20/2021 04/17/2021 03/20/2021 03/20/2021 08/08/2020 02/23/2019  Does Patient Have a Medical Advance Directive? - No No No No No No  Would patient like information on creating a medical advance directive? No - Patient declined - - No - Patient declined - No - Patient declined No - Patient declined    Current Medications (verified) Outpatient Encounter Medications as of 08/09/2021  Medication Sig   albuterol (VENTOLIN HFA) 108 (90 Base) MCG/ACT inhaler INHALE TWO PUFFS BY MOUTH 4 TIMES DAILY AS  NEEDED (Patient taking differently: Inhale 2 puffs into the lungs every 4 (four) hours as needed for wheezing or shortness of breath.)   apixaban (ELIQUIS) 5 MG TABS tablet Take 1 tablet (5 mg total) by mouth 2 (two) times daily.   azithromycin (ZITHROMAX) 250 MG tablet Take 250 mg by mouth as directed.   benzonatate (TESSALON) 100 MG capsule Take by mouth.   Budeson-Glycopyrrol-Formoterol (BREZTRI AEROSPHERE) 160-9-4.8 MCG/ACT AERO Inhale 1 puff into the lungs 2 (two) times daily.   Calcium Citrate-Vitamin D (CALCIUM CITRATE + D) 315-250 MG-UNIT TABS Take 2 tablets by mouth daily.   cetirizine (ZYRTEC) 10 MG tablet Take 5-10 mg by mouth at bedtime.    Cholecalciferol (VITAMIN D) 2000 UNITS CAPS Take 1 capsule by mouth at bedtime.   denosumab (PROLIA) 60 MG/ML SOSY injection TO BE ADMINISTERED IN PHYSICIAN'S OFFICE. INJECT ONE SYRINGE SUBCOUSLY ONCE EVERY 6 MONTHS. REFRIGERATE. USE WITHIN 14 DAYS ONCE AT ROOM TEMPERATURE.   diclofenac Sodium (VOLTAREN) 1 % GEL Apply 2 g topically 4 (four) times daily.   diltiazem (CARDIZEM CD) 300 MG 24 hr capsule Take 1 capsule (300 mg total) by mouth daily.   esomeprazole (NEXIUM) 40 MG capsule TAKE 1 CAPSULE BY MOUTH EVERY DAY   ferrous sulfate 325 (65 FE) MG tablet Take 1 tablet (325 mg total) by mouth at bedtime.   ipratropium-albuterol (DUONEB) 0.5-2.5 (3) MG/3ML SOLN TAKE 3 MLS BY NEBULIZATION EVERY 6 (SIX) HOURS AS NEEDED. DX: ASTHMA 493.90   metoprolol tartrate (LOPRESSOR) 25 MG tablet TAKE 1 TABLET (25 MG TOTAL) BY MOUTH DAILY AS NEEDED.   montelukast (SINGULAIR) 10  MG tablet One tablet by mouth daily at bedtime   Multiple Vitamins-Minerals (PRESERVISION/LUTEIN PO) Take 1 capsule by mouth at bedtime.    nitroGLYCERIN (NITROLINGUAL) 0.4 MG/SPRAY spray ONE SPRAY UNDER TONGUE AS NEED FOR CHESTPAIN. MAY REPEAT IN 5 MINUTES IF PAIN NOT RELIEVED CALL 911.   Polyethyl Glycol-Propyl Glycol (SYSTANE) 0.4-0.3 % SOLN Place 1 drop into both eyes 2 (two) times daily.    predniSONE (DELTASONE) 5 MG tablet Take by mouth.   rosuvastatin (CRESTOR) 20 MG tablet Take 1 tablet (20 mg total) by mouth daily.   [DISCONTINUED] dexamethasone (DECADRON) 2 MG tablet Take 4 tablets for 3 days then 3 tablets for 3 days then 2 tablets for 3 days then 1 tablet for 3 days (Patient not taking: Reported on 08/09/2021)   No facility-administered encounter medications on file as of 08/09/2021.    Allergies (verified) Actonel [risedronate], Fosamax [alendronate sodium], Mevacor [lovastatin], Miacalcin [calcitonin (salmon)], and Mobic [meloxicam]   History: Past Medical History:  Diagnosis Date   Anemia    Asthma    Brain tumor ([place])    1986   Breast cancer ([place])    Right breast   Cataract    Colon polyps    COPD (chronic obstructive pulmonary disease) ([place])    Hiatal hernia    Hyperlipidemia    Leg edema    Macular degeneration    Osteoporosis    Pneumonia    PVD (posterior vitreous detachment)    Right knee pain    Shingles rash    Past Surgical History:  Procedure Laterality Date   CATARACT EXTRACTION, BILATERAL     COLONOSCOPY     CORONARY PRESSURE WIRE/FFR WITH 3D MAPPING N/A 03/21/2021   Procedure: Coronary Pressure Wire/FFR w/3D Mapping;  Surgeon: Nelva Bush, MD;  Location: [facility];  Service: Cardiovascular;  Laterality: N/A;   CRANIECTOMY / CRANIOTOMY FOR EXCISION OF BRAIN TUMOR     Left side brain   LEFT HEART CATH AND CORONARY ANGIOGRAPHY N/A 03/21/2021   Procedure: LEFT HEART CATH AND CORONARY ANGIOGRAPHY;  Surgeon: Nelva Bush, MD;  Location: [facility];  Service: Cardiovascular;  Laterality: N/A;   MASTECTOMY Right    PARTIAL HYSTERECTOMY     POLYPECTOMY     SKIN CANCER EXCISION Left    Under left eye   Family History  Problem Relation Age of Onset   Heart disease Mother 73       Stroke    Dementia Mother    Congestive Heart Failure Mother    Osteoporosis Mother    Stroke Mother    Heart disease Sister     Hypertension Sister    Diabetes Sister    Osteoporosis Sister    Heart disease Brother    Hyperlipidemia Brother    Diabetes Brother    Heart disease Sister    Hypertension Sister    Diabetes Sister    Diabetes Sister    Hypertension Sister    Diabetes Sister    Hypertension Sister    Diabetes Brother    Hyperlipidemia Brother    Cancer Father        lung   Heart attack Father    Diabetes Brother    Heart disease Brother    Hyperlipidemia Brother    Diabetes Brother    Diabetes Son    Colon cancer Neg Hx    Rectal cancer Neg Hx    Stomach cancer Neg Hx    Esophageal cancer Neg Hx  Social History   Socioeconomic History   Marital status: Widowed    Spouse name: Not on file   Number of children: 1   Years of education: 7th   Highest education level: 7th grade  Occupational History   Occupation: retired  Tobacco Use   Smoking status: Former    Types: Cigarettes    Quit date: 02/09/1961    Years since quitting: 60.5   Smokeless tobacco: Never  Vaping Use   Vaping Use: Never used  Substance and Sexual Activity   Alcohol use: No    Alcohol/week: 0.0 standard drinks   Drug use: No   Sexual activity: Not Currently  Other Topics Concern   Not on file  Social History Narrative   Son lives with patient.   Retired from farming.   Highest level of education:  7th   Social Determinants of Health   Financial Resource Strain: Low Risk    Difficulty of Paying Living Expenses: Not hard at all  Food Insecurity: No Food Insecurity   Worried About Charity fundraiser in the Last Year: Never true   Arboriculturist in the Last Year: Never true  Transportation Needs: No Transportation Needs   Lack of Transportation (Medical): No   Lack of Transportation (Non-Medical): No  Physical Activity: Insufficiently Active   Days of Exercise per Week: 7 days   Minutes of Exercise per Session: 10 min  Stress: No Stress Concern Present   Feeling of Stress : Not at all  Social  Connections: Socially Isolated   Frequency of Communication with Friends and Family: More than three times a week   Frequency of Social Gatherings with Friends and Family: More than three times a week   Attends Religious Services: Never   Marine scientist or Organizations: No   Attends Archivist Meetings: Never   Marital Status: Widowed    Tobacco Counseling Counseling given: Not Answered   Clinical Intake:  Pre-visit preparation completed: Yes  Pain : 0-10 Pain Score: 4  Pain Type: Chronic pain Pain Location: Generalized Pain Descriptors / Indicators: Aching, Discomfort, Sore Pain Onset: More than a month ago Pain Frequency: Intermittent     BMI - recorded: 30.11 Nutritional Status: BMI > 30  Obese Nutritional Risks: None Diabetes: No  How often do you need to have someone help you when you read instructions, pamphlets, or other written materials from your doctor or pharmacy?: 1 - Never  Diabetic? no  Interpreter Needed?: No  Information entered by :: Mistey Hoffert, LPN   Activities of Daily Living In your present state of health, do you have any difficulty performing the following activities: 08/09/2021 04/21/2021  Hearing? N N  Vision? N Y  Difficulty concentrating or making decisions? Y N  Walking or climbing stairs? Y Y  Dressing or bathing? N N  Doing errands, shopping? Y Y  Comment she has never driven - Son drives her or she uses [employer] Facilities manager and eating ? N -  Using the Toilet? N -  In the past six months, have you accidently leaked urine? Y -  Comment wears pads for protection -  Do you have problems with loss of bowel control? N -  Managing your Medications? N -  Managing your Finances? N -  Housekeeping or managing your Housekeeping? N -  Some recent data might be hidden    Patient Care Team: Dettinger, Fransisca Kaufmann, MD as PCP - General (Family Medicine)  Minus Breeding, MD as PCP - Cardiology (Cardiology) Milus Banister,  MD (Gastroenterology) Sherlynn Stalls, MD as Consulting Physician (Ophthalmology) Satira Sark, MD as Consulting Physician (Cardiology) Okey Regal, Ashley as Consulting Physician (Optometry) Minus Breeding, MD as Consulting Physician (Cardiology)  Indicate any recent Medical Services you may have received from other than [facility] providers in the past year (date may be approximate).     Assessment:   This is a routine wellness examination for Krystal [name].  Hearing/Vision screen Hearing Screening - Comments:: C/o mild hearing difficulties  Vision Screening - Comments:: Wears rx glasses - up to date with routine eye exams with [facility] (sees every 4-6 weeks)  Dietary issues and exercise activities discussed: Current Exercise Habits: Home exercise routine, Type of exercise: walking;stretching, Time (Minutes): 10, Frequency (Times/Week): 7, Weekly Exercise (Minutes/Week): 70, Intensity: Mild, Exercise limited by: orthopedic condition(s);respiratory conditions(s);cardiac condition(s)   Goals Addressed   None    Depression Screen PHQ 2/9 Scores 08/09/2021 06/03/2021 05/02/2021 03/28/2021 02/06/2021 11/05/2020 08/08/2020  PHQ - 2 Score 0 0 0 0 0 0 0  PHQ- 9 Score - 0 - - - - -    Fall Risk Fall Risk  06/03/2021 05/02/2021 03/28/2021 02/06/2021 11/05/2020  Falls in the past year? 0 0 0 0 0  Number falls in past yr: - - - - -  Comment - - - - -  Injury with Fall? - - - - -  Comment - - - - -  Risk Factor Category  - - - - -  Risk for fall due to : - - - - -  Risk for fall due to: Comment - - - - -  Follow up - - - - -    FALL RISK PREVENTION PERTAINING TO THE HOME:  Any stairs in or around the home? No  If so, are there any without handrails? No  Home free of loose throw rugs in walkways, pet beds, electrical cords, etc? Yes  Adequate lighting in your home to reduce risk of falls? Yes   ASSISTIVE DEVICES UTILIZED TO PREVENT FALLS:  Life alert? Yes  Use of a cane, walker or w/c? Yes   Grab bars in the bathroom? No  Shower chair or bench in shower? Yes  Elevated toilet seat or a handicapped toilet? Yes   TIMED UP AND GO:  Was the test performed? No . Telephonic visit  Cognitive Function: MMSE - Mini Mental State Exam 01/13/2018 01/05/2017 12/10/2015 10/18/2014  Orientation to time 5 5 5 5   Orientation to Place 5 5 5 5   Registration 3 3 3 3   Attention/ Calculation 4 5 5 5   Recall 2 2 3 3   Language- name 2 objects 2 2 2 2   Language- repeat 1 1 1 1   Language- follow 3 step command 3 1 3 3   Language- read & follow direction 1 1 1 1   Write a sentence 1 1 1 1   Copy design 0 1 1 1   Total score 27 27 30 30      6CIT Screen 08/09/2021 08/08/2020 02/23/2019  What Year? 0 points 0 points 0 points  What month? 0 points 0 points 0 points  What time? 0 points 0 points 0 points  Count back from 20 0 points 0 points 0 points  Months in reverse 0 points 0 points 0 points  Repeat phrase 2 points 4 points 0 points  Total Score 2 4 0    Immunizations Immunization History  Administered Date(s) Administered  Fluad Quad(high Dose 65+) 05/03/2020, 05/02/2021   Moderna Sars-Covid-2 Vaccination 08/27/2019, 09/26/2019, 03/21/2020   Pneumococcal Conjugate-13 06/21/2013   Pneumococcal Polysaccharide-23 04/26/2005   Tdap 08/07/2020   Zoster, Live 02/06/2021    TDAP status: Up to date  Flu Vaccine status: Up to date  Pneumococcal vaccine status: Up to date  Covid-19 vaccine status: Completed vaccines  Qualifies for Shingles Vaccine? Yes   Zostavax completed No   Shingrix Completed?: No.    Education has been provided regarding the importance of this vaccine. Patient has been advised to call insurance company to determine out of pocket expense if they have not yet received this vaccine. Advised may also receive vaccine at local pharmacy or Health Dept. Verbalized acceptance and understanding.  Screening Tests Health Maintenance  Topic Date Due   Zoster Vaccines- Shingrix (1 of  2) 09/03/2021 (Originally 04/26/1960)   COVID-19 Vaccine (4 - Booster for Moderna series) 11/21/2021 (Originally 05/16/2020)   COLONOSCOPY (Pts 45-64yrs Insurance coverage will need to be confirmed)  02/06/2022 (Originally 04/07/2020)   DEXA SCAN  03/02/2023   TETANUS/TDAP  08/07/2030   Pneumonia Vaccine 39+ Years old  Completed   INFLUENZA VACCINE  Completed   HPV VACCINES  Aged Out    Health Maintenance  There are no preventive care reminders to display for this patient.  Colorectal cancer screening: No longer required.   Mammogram status: Completed 07/03/2021. Repeat every year  Bone Density status: Completed 03/01/2021. Results reflect: Bone density results: OSTEOPOROSIS. Repeat every 2 years.  Lung Cancer Screening: (Low Dose CT Chest recommended if Age 84-80 years, 30 pack-year currently smoking OR have quit w/in 15years.) does not qualify.   Additional Screening:  Hepatitis C Screening: does not qualify  Vision Screening: Recommended annual ophthalmology exams for early detection of glaucoma and other disorders of the eye. Is the patient up to date with their annual eye exam?  Yes  Who is the provider or what is the name of the office in which the patient attends annual eye exams? [facility] If pt is not established with a provider, would they like to be referred to a provider to establish care? No .   Dental Screening: Recommended annual dental exams for proper oral hygiene  Community Resource Referral / Chronic Care Management: CRR required this visit?  No   CCM required this visit?  No      Plan:     I have personally reviewed and noted the following in the patients chart:   Medical and social history Use of alcohol, tobacco or illicit drugs  Current medications and supplements including opioid prescriptions.  Functional ability and status Nutritional status Physical activity Advanced directives List of other physicians Hospitalizations, surgeries, and  ER visits in previous 12 months Vitals Screenings to include cognitive, depression, and falls Referrals and appointments  In addition, I have reviewed and discussed with patient certain preventive protocols, quality metrics, and best practice recommendations. A written personalized care plan for preventive services as well as general preventive health recommendations were provided to patient.     Sandrea Hammond, LPN   4/0/[id number]   Nurse Notes: None

## 2021-08-09 NOTE — Patient Instructions (Signed)
Krystal [name] , Thank you for taking time to come for your Medicare Wellness Visit. I appreciate your ongoing commitment to your health goals. Please review the following plan we discussed and let me know if I can assist you in the future.   Screening recommendations/referrals: Colonoscopy: Done 04/07/2017 - no repeat Mammogram: Done 07/03/2021 - Repeat annually  Bone Density: Done 03/01/2021 - Repeat every 2 years  Recommended yearly ophthalmology/optometry visit for glaucoma screening and checkup Recommended yearly dental visit for hygiene and checkup  Vaccinations: Influenza vaccine: Done 05/02/2021 - Repeat annually  Pneumococcal vaccine: Done 04/26/2005 & 06/21/2013 Tdap vaccine: Done 08/07/2020 - Repeat in 10 years Shingles vaccine: Done 02/06/2021 - due for second dose*   Covid-19: Done 08/27/2019, 09/26/2019, 03/21/2020, & 03/27/2021  Advanced directives: Advance directive discussed with you today. Even though you declined this today, please call our office should you change your mind, and we can give you the proper paperwork for you to fill out.   Conditions/risks identified: Aim for 30 minutes of exercise and/or walking each day, drink 6-8 glasses of water and eat lots of fruits and vegetables.   Next appointment: Follow up in one year for your annual wellness visit   Preventive Care 65 Years and Older, Female Preventive care refers to lifestyle choices and visits with your health care provider that can promote health and wellness. What does preventive care include? A yearly physical exam. This is also called an annual well check. Dental exams once or twice a year. Routine eye exams. Ask your health care provider how often you should have your eyes checked. Personal lifestyle choices, including: Daily care of your teeth and gums. Regular physical activity. Eating a healthy diet. Avoiding tobacco and drug use. Limiting alcohol use. Practicing safe sex. Taking low-dose aspirin every  day. Taking vitamin and mineral supplements as recommended by your health care provider. What happens during an annual well check? The services and screenings done by your health care provider during your annual well check will depend on your age, overall health, lifestyle risk factors, and family history of disease. Counseling  Your health care provider may ask you questions about your: Alcohol use. Tobacco use. Drug use. Emotional well-being. Home and relationship well-being. Sexual activity. Eating habits. History of falls. Memory and ability to understand (cognition). Work and work Statistician. Reproductive health. Screening  You may have the following tests or measurements: Height, weight, and BMI. Blood pressure. Lipid and cholesterol levels. These may be checked every 5 years, or more frequently if you are over 31 years old. Skin check. Lung cancer screening. You may have this screening every year starting at age 32 if you have a 30-pack-year history of smoking and currently smoke or have quit within the past 15 years. Fecal occult blood test (FOBT) of the stool. You may have this test every year starting at age 24. Flexible sigmoidoscopy or colonoscopy. You may have a sigmoidoscopy every 5 years or a colonoscopy every 10 years starting at age 72. Hepatitis C blood test. Hepatitis B blood test. Sexually transmitted disease (STD) testing. Diabetes screening. This is done by checking your blood sugar (glucose) after you have not eaten for a while (fasting). You may have this done every 1-3 years. Bone density scan. This is done to screen for osteoporosis. You may have this done starting at age 24. Mammogram. This may be done every 1-2 years. Talk to your health care provider about how often you should have regular mammograms. Talk with your  health care provider about your test results, treatment options, and if necessary, the need for more tests. Vaccines  Your health care  provider may recommend certain vaccines, such as: Influenza vaccine. This is recommended every year. Tetanus, diphtheria, and acellular pertussis (Tdap, Td) vaccine. You may need a Td booster every 10 years. Zoster vaccine. You may need this after age 59. Pneumococcal 13-valent conjugate (PCV13) vaccine. One dose is recommended after age 67. Pneumococcal polysaccharide (PPSV23) vaccine. One dose is recommended after age 24. Talk to your health care provider about which screenings and vaccines you need and how often you need them. This information is not intended to replace advice given to you by your health care provider. Make sure you discuss any questions you have with your health care provider. Document Released: 08/17/2015 Document Revised: 04/09/2016 Document Reviewed: 05/22/2015 Elsevier Interactive Patient Education  2017 [place] Prevention in the Home Falls can cause injuries. They can happen to people of all ages. There are many things you can do to make your home safe and to help prevent falls. What can I do on the outside of my home? Regularly fix the edges of walkways and driveways and fix any cracks. Remove anything that might make you trip as you walk through a door, such as a raised step or threshold. Trim any bushes or trees on the path to your home. Use bright outdoor lighting. Clear any walking paths of anything that might make someone trip, such as rocks or tools. Regularly check to see if handrails are loose or broken. Make sure that both sides of any steps have handrails. Any raised decks and porches should have guardrails on the edges. Have any leaves, snow, or ice cleared regularly. Use sand or salt on walking paths during winter. Clean up any spills in your garage right away. This includes oil or grease spills. What can I do in the bathroom? Use night lights. Install grab bars by the toilet and in the tub and shower. Do not use towel bars as grab  bars. Use non-skid mats or decals in the tub or shower. If you need to sit down in the shower, use a plastic, non-slip stool. Keep the floor dry. Clean up any water that spills on the floor as soon as it happens. Remove soap buildup in the tub or shower regularly. Attach bath mats securely with double-sided non-slip rug tape. Do not have throw rugs and other things on the floor that can make you trip. What can I do in the bedroom? Use night lights. Make sure that you have a light by your bed that is easy to reach. Do not use any sheets or blankets that are too big for your bed. They should not hang down onto the floor. Have a firm chair that has side arms. You can use this for support while you get dressed. Do not have throw rugs and other things on the floor that can make you trip. What can I do in the kitchen? Clean up any spills right away. Avoid walking on wet floors. Keep items that you use a lot in easy-to-reach places. If you need to reach something above you, use a strong step stool that has a grab bar. Keep electrical cords out of the way. Do not use floor polish or wax that makes floors slippery. If you must use wax, use non-skid floor wax. Do not have throw rugs and other things on the floor that can make you trip. What  can I do with my stairs? Do not leave any items on the stairs. Make sure that there are handrails on both sides of the stairs and use them. Fix handrails that are broken or loose. Make sure that handrails are as long as the stairways. Check any carpeting to make sure that it is firmly attached to the stairs. Fix any carpet that is loose or worn. Avoid having throw rugs at the top or bottom of the stairs. If you do have throw rugs, attach them to the floor with carpet tape. Make sure that you have a light switch at the top of the stairs and the bottom of the stairs. If you do not have them, ask someone to add them for you. What else can I do to help prevent  falls? Wear shoes that: Do not have high heels. Have rubber bottoms. Are comfortable and fit you well. Are closed at the toe. Do not wear sandals. If you use a stepladder: Make sure that it is fully opened. Do not climb a closed stepladder. Make sure that both sides of the stepladder are locked into place. Ask someone to hold it for you, if possible. Clearly mark and make sure that you can see: Any grab bars or handrails. First and last steps. Where the edge of each step is. Use tools that help you move around (mobility aids) if they are needed. These include: Canes. Walkers. Scooters. Crutches. Turn on the lights when you go into a dark area. Replace any light bulbs as soon as they burn out. Set up your furniture so you have a clear path. Avoid moving your furniture around. If any of your floors are uneven, fix them. If there are any pets around you, be aware of where they are. Review your medicines with your doctor. Some medicines can make you feel dizzy. This can increase your chance of falling. Ask your doctor what other things that you can do to help prevent falls. This information is not intended to replace advice given to you by your health care provider. Make sure you discuss any questions you have with your health care provider. Document Released: 05/17/2009 Document Revised: 12/27/2015 Document Reviewed: 08/25/2014 Elsevier Interactive Patient Education  2017 [employer].

## 2021-08-20 NOTE — Progress Notes (Signed)
Cardiology Office Note   Date:  08/21/2021   ID:  Krystal [name], DOB 1940/10/13, MRN [id number]  PCP:  Dettinger, Fransisca Kaufmann, MD  Cardiologist:   Minus Breeding, MD   Chief Complaint  Patient presents with   Shortness of Breath      History of Present Illness: Krystal [name] is a 81 y.o. female who presents for follow up of SVT.    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  I sent her for this because of ongoing chest pain.  She did have a large hiatal hernia.   Since I last saw her she was last in the hospital in Sept 2022.  She had acute respiratory failure.  She was treated for COPD.  She had PAF.  I reviewed these records for this visit.    She denies any new cardiovascular symptoms.  She gets around very slowly with a walker.  She has chronic shortness of breath which is unchanged.  She is not having any PND or orthopnea.  She is not having any chest pressure, neck or arm discomfort.  She is not currently reporting any chest pressure that she did have.  She has had no new weight gain or edema.  She does not feel her heart racing or skipping and would not think that she was in atrial fibrillation.   Past Medical History:  Diagnosis Date   Anemia    Asthma    Brain tumor ([place])    1986   Breast cancer ([facility])    Right breast   Cataract    Colon polyps    COPD (chronic obstructive pulmonary disease) (HCC)    Hiatal hernia    Hyperlipidemia    Leg edema    Macular degeneration    Osteoporosis    Pneumonia    PVD (posterior vitreous detachment)    Right knee pain    Shingles rash     Past Surgical History:  Procedure Laterality Date   CATARACT EXTRACTION, BILATERAL     COLONOSCOPY     CORONARY PRESSURE WIRE/FFR WITH 3D MAPPING N/A 03/21/2021   Procedure: Coronary Pressure Wire/FFR w/3D Mapping;  Surgeon: Nelva Bush, MD;  Location: [facility];  Service: Cardiovascular;  Laterality: N/A;   CRANIECTOMY / CRANIOTOMY FOR  EXCISION OF BRAIN TUMOR     Left side brain   LEFT HEART CATH AND CORONARY ANGIOGRAPHY N/A 03/21/2021   Procedure: LEFT HEART CATH AND CORONARY ANGIOGRAPHY;  Surgeon: Nelva Bush, MD;  Location: [facility];  Service: Cardiovascular;  Laterality: N/A;   MASTECTOMY Right    PARTIAL HYSTERECTOMY     POLYPECTOMY     SKIN CANCER EXCISION Left    Under left eye     Current Outpatient Medications  Medication Sig Dispense Refill   albuterol (VENTOLIN HFA) 108 (90 Base) MCG/ACT inhaler INHALE TWO PUFFS BY MOUTH 4 TIMES DAILY AS NEEDED (Patient taking differently: Inhale 2 puffs into the lungs every 4 (four) hours as needed for wheezing or shortness of breath.) 18 each 2   apixaban (ELIQUIS) 5 MG TABS tablet Take 1 tablet (5 mg total) by mouth 2 (two) times daily. 180 tablet 3   Budeson-Glycopyrrol-Formoterol (BREZTRI AEROSPHERE) 160-9-4.8 MCG/ACT AERO Inhale 1 puff into the lungs 2 (two) times daily. 10.7 g 11   Calcium Citrate-Vitamin D (CALCIUM CITRATE + D) 315-250 MG-UNIT TABS Take 2 tablets by mouth daily. 120 tablet    cetirizine (  ZYRTEC) 10 MG tablet Take 5-10 mg by mouth at bedtime.      Cholecalciferol (VITAMIN D) 2000 UNITS CAPS Take 1 capsule by mouth at bedtime.     denosumab (PROLIA) 60 MG/ML SOSY injection TO BE ADMINISTERED IN PHYSICIAN'S OFFICE. INJECT ONE SYRINGE SUBCOUSLY ONCE EVERY 6 MONTHS. REFRIGERATE. USE WITHIN 14 DAYS ONCE AT ROOM TEMPERATURE. 1 mL 1   diclofenac Sodium (VOLTAREN) 1 % GEL Apply 2 g topically 4 (four) times daily. 350 g 3   diltiazem (CARDIZEM CD) 300 MG 24 hr capsule Take 1 capsule (300 mg total) by mouth daily. 90 capsule 3   esomeprazole (NEXIUM) 40 MG capsule TAKE 1 CAPSULE BY MOUTH EVERY DAY 90 capsule 3   ferrous sulfate 325 (65 FE) MG tablet Take 1 tablet (325 mg total) by mouth at bedtime. 90 tablet 3   ipratropium-albuterol (DUONEB) 0.5-2.5 (3) MG/3ML SOLN TAKE 3 MLS BY NEBULIZATION EVERY 6 (SIX) HOURS AS NEEDED. DX: ASTHMA 493.90 360 mL 0    metoprolol tartrate (LOPRESSOR) 25 MG tablet TAKE 1 TABLET (25 MG TOTAL) BY MOUTH DAILY AS NEEDED. 90 tablet 3   montelukast (SINGULAIR) 10 MG tablet One tablet by mouth daily at bedtime 90 tablet 3   Multiple Vitamins-Minerals (PRESERVISION/LUTEIN PO) Take 1 capsule by mouth at bedtime.      nitroGLYCERIN (NITROLINGUAL) 0.4 MG/SPRAY spray ONE SPRAY UNDER TONGUE AS NEED FOR CHESTPAIN. MAY REPEAT IN 5 MINUTES IF PAIN NOT RELIEVED CALL 911. 12 g 3   Polyethyl Glycol-Propyl Glycol (SYSTANE) 0.4-0.3 % SOLN Place 1 drop into both eyes 2 (two) times daily.     predniSONE (DELTASONE) 5 MG tablet Take by mouth.     rosuvastatin (CRESTOR) 20 MG tablet Take 1 tablet (20 mg total) by mouth daily. 90 tablet 1   azithromycin (ZITHROMAX) 250 MG tablet Take 250 mg by mouth as directed. (Patient not taking: Reported on 08/21/2021)     benzonatate (TESSALON) 100 MG capsule Take by mouth. (Patient not taking: Reported on 08/21/2021)     No current facility-administered medications for this visit.    Allergies:   Actonel [risedronate], Fosamax [alendronate sodium], Mevacor [lovastatin], Miacalcin [calcitonin (salmon)], and Mobic [meloxicam]    ROS:  Please see the history of present illness.   Otherwise, review of systems are positive for none.   All other systems are reviewed and negative.    PHYSICAL EXAM: VS:  BP 112/60 (BP Location: Left Arm, Patient Position: Sitting, Cuff Size: Normal)    Pulse 82    Ht 5\' 3"  (1.6 m)    Wt 169 lb (76.7 kg)    SpO2 93%    BMI 29.94 kg/m  , BMI Body mass index is 29.94 kg/m. GENERAL:  Well appearing NECK:  No jugular venous distention, waveform within normal limits, carotid upstroke brisk and symmetric, no bruits, no thyromegaly LUNGS:  Clear to auscultation bilaterally CHEST:  Unremarkable HEART:  PMI not displaced or sustained,S1 and S2 within normal limits, no S3, no S4, no clicks, no rubs, 3 out of 6 apical systolic murmur radiating slightly at the aortic outflow  tract, no obvious diastolic murmurs murmurs ABD:  Flat, positive bowel sounds normal in frequency in pitch, no bruits, no rebound, no guarding, no midline pulsatile mass, no hepatomegaly, no splenomegaly EXT:  2 plus pulses throughout, no edema, no cyanosis no clubbing  CATH:   Diagnostic Dominance: Right    EKG:  EKG is not ordered today.   Recent Labs: 03/22/2021: TSH 3.542 04/20/2021:  B Natriuretic Peptide 137.0 04/24/2021: Magnesium 2.4 05/02/2021: ALT 23; BUN 22; Creatinine, Ser 0.88; Hemoglobin 13.9; Platelets 183; Potassium 3.7; Sodium 143    Lipid Panel    Component Value Date/Time   CHOL 134 03/22/2021 0148   CHOL 226 (H) 11/05/2020 1416   CHOL 152 03/04/2013 1212   TRIG 83 03/22/2021 0148   TRIG 93 04/08/2016 1158   TRIG 97 03/04/2013 1212   HDL 55 03/22/2021 0148   HDL 57 11/05/2020 1416   HDL 68 04/08/2016 1158   HDL 62 03/04/2013 1212   CHOLHDL 2.4 03/22/2021 0148   VLDL 17 03/22/2021 0148   LDLCALC 62 03/22/2021 0148   LDLCALC 134 (H) 11/05/2020 1416   LDLCALC 75 01/27/2014 1147   LDLCALC 71 03/04/2013 1212      Wt Readings from Last 3 Encounters:  08/21/21 169 lb (76.7 kg)  08/09/21 170 lb (77.1 kg)  06/03/21 168 lb (76.2 kg)      Other studies Reviewed: Additional studies/ records that were reviewed today include:  Hospital records Review of the above records demonstrates:  NA   ASSESSMENT AND PLAN:  SVT:   She has had no symptomatic recurrence.  No change in therapy.  CHEST PAIN:   She has nonobstructive CAD.    We will continue with risk reduction  PAF: She tolerates anticoagulation.  She is had no symptomatic recurrence.  No change in therapy.  DYSLIPIDEMIA:  LDL was 62.     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  I will follow up in August with repeat echo.   DYSPNEA:    She has had hospitalizations for COPD flare and I will arrange an appointment with pulmonary.    Current medicines are reviewed at length with the patient  today.  The patient does not have concerns regarding medicines.  The following changes have been made:   None  Labs/ tests ordered today include:    Orders Placed This Encounter  Procedures   Ambulatory referral to Pulmonology   ECHOCARDIOGRAM COMPLETE     Disposition:   FU with me in six months.     Signed, Minus Breeding, MD  08/21/2021 4:56 PM    [facility]

## 2021-08-21 ENCOUNTER — Encounter: Payer: Self-pay | Admitting: Cardiology

## 2021-08-21 ENCOUNTER — Other Ambulatory Visit: Payer: Self-pay

## 2021-08-21 ENCOUNTER — Ambulatory Visit (INDEPENDENT_AMBULATORY_CARE_PROVIDER_SITE_OTHER): Payer: Medicare HMO | Admitting: Cardiology

## 2021-08-21 VITALS — BP 112/60 | HR 82 | Ht 63.0 in | Wt 169.0 lb

## 2021-08-21 DIAGNOSIS — J441 Chronic obstructive pulmonary disease with (acute) exacerbation: Secondary | ICD-10-CM | POA: Diagnosis not present

## 2021-08-21 DIAGNOSIS — E785 Hyperlipidemia, unspecified: Secondary | ICD-10-CM | POA: Diagnosis not present

## 2021-08-21 DIAGNOSIS — R072 Precordial pain: Secondary | ICD-10-CM

## 2021-08-21 DIAGNOSIS — I351 Nonrheumatic aortic (valve) insufficiency: Secondary | ICD-10-CM | POA: Diagnosis not present

## 2021-08-21 DIAGNOSIS — I480 Paroxysmal atrial fibrillation: Secondary | ICD-10-CM

## 2021-08-21 DIAGNOSIS — R0602 Shortness of breath: Secondary | ICD-10-CM

## 2021-08-21 NOTE — Patient Instructions (Signed)
Medication Instructions:  The current medical regimen is effective;  continue present plan and medications.  *If you need a refill on your cardiac medications before your next appointment, please call your pharmacy*  Testing/Procedures: Your physician has requested that you have an echocardiogram in August at [facility]. Echocardiography is a painless test that uses sound waves to create images of your heart. It provides your doctor with information about the size and shape of your heart and how well your hearts chambers and valves are working. This procedure takes approximately one hour. There are no restrictions for this procedure.  You have been referred to pulmonary and will be contacted to be scheduled for an appointment.  Follow-Up: At [facility], you and your health needs are our priority.  As part of our continuing mission to provide you with exceptional heart care, we have created designated Provider Care Teams.  These Care Teams include your primary Cardiologist (physician) and Advanced Practice Providers (APPs -  Physician Assistants and Nurse Practitioners) who all work together to provide you with the care you need, when you need it.  We recommend signing up for the patient portal called "MyChart".  Sign up information is provided on this After Visit Summary.  MyChart is used to connect with patients for Virtual Visits (Telemedicine).  Patients are able to view lab/test results, encounter notes, upcoming appointments, etc.  Non-urgent messages can be sent to your provider as well.   To learn more about what you can do with MyChart, go to [email or web address].    Your next appointment:   6 month(s)  The format for your next appointment:   In Person  Provider:   Minus Breeding, MD   Thank you for choosing [facility]!!

## 2021-08-22 MED ORDER — NITROGLYCERIN 0.4 MG/SPRAY TL SOLN: 3 refills | Status: DC

## 2021-08-22 NOTE — Addendum Note (Signed)
Addended by: Shellia Cleverly on: 08/22/2021 08:55 AM   Modules accepted: Orders

## 2021-09-04 ENCOUNTER — Ambulatory Visit (INDEPENDENT_AMBULATORY_CARE_PROVIDER_SITE_OTHER): Payer: Medicare HMO | Admitting: Family Medicine

## 2021-09-04 ENCOUNTER — Encounter: Payer: Self-pay | Admitting: Family Medicine

## 2021-09-04 VITALS — BP 132/74 | HR 75 | Ht 63.0 in | Wt 167.0 lb

## 2021-09-04 DIAGNOSIS — F339 Major depressive disorder, recurrent, unspecified: Secondary | ICD-10-CM

## 2021-09-04 DIAGNOSIS — R739 Hyperglycemia, unspecified: Secondary | ICD-10-CM | POA: Diagnosis not present

## 2021-09-04 DIAGNOSIS — R69 Illness, unspecified: Secondary | ICD-10-CM | POA: Diagnosis not present

## 2021-09-04 DIAGNOSIS — M810 Age-related osteoporosis without current pathological fracture: Secondary | ICD-10-CM | POA: Diagnosis not present

## 2021-09-04 DIAGNOSIS — F411 Generalized anxiety disorder: Secondary | ICD-10-CM | POA: Diagnosis not present

## 2021-09-04 DIAGNOSIS — Z23 Encounter for immunization: Secondary | ICD-10-CM

## 2021-09-04 DIAGNOSIS — E782 Mixed hyperlipidemia: Secondary | ICD-10-CM

## 2021-09-04 LAB — BAYER DCA HB A1C WAIVED: HB A1C (BAYER DCA - WAIVED): 6.2 % — ABNORMAL HIGH (ref 4.8–5.6)

## 2021-09-04 NOTE — Progress Notes (Signed)
BP 132/74    Pulse 75    Ht _0  (1.6 m)    Wt 167 lb (75.8 kg)    SpO2 96%    BMI 29.58 kg/m    Subjective:   Patient ID: ANAAYA FUSTER, female    DOB: [birth year], 81 y.o.   MRN: [id number]  HPI: Krystal [name] is a 81 y.o. female presenting on 09/04/2021 for Medical Management of Chronic Issues, Hyperlipidemia, COPD, and Depression   HPI Depression anxiety Patient feels like she is due to get depression and anxiety.  She is not currently taking anything and denies needing anything.  Hyperlipidemia Patient is coming in for recheck of his hyperlipidemia. The patient is currently taking Crestor. They deny any issues with myalgias or history of liver damage from it. They deny any focal numbness or weakness or chest pain.   Osteoporosis/osteopenia Fractures or history of fracture: None Medication: Calcium and vitamin D and Prolia Duration of treatment: Just under a year Last bone density scan: 03/01/2021 Last T score: -2.8  Patient had elevated blood sugar in the last 2 blood draws, will do an A1c today.  Patient continues to have shortness of breath.  She does have a pulmonology appointment on March 13 and will discuss that with them.  There is nothing acutely new but just something she has been dealing with for some time and is not improving.  Relevant past medical, surgical, family and social history reviewed and updated as indicated. Interim medical history since our last visit reviewed. Allergies and medications reviewed and updated.  Review of Systems  Constitutional:  Negative for chills and fever.  Eyes:  Negative for visual disturbance.  Respiratory:  Positive for shortness of breath. Negative for chest tightness and wheezing.   Cardiovascular:  Negative for chest pain and leg swelling.  Genitourinary:  Negative for difficulty urinating and dysuria.  Musculoskeletal:  Negative for back pain and gait problem.  Skin:  Negative for rash.  Neurological:  Negative  for light-headedness and headaches.  Psychiatric/Behavioral:  Negative for agitation and behavioral problems.   All other systems reviewed and are negative.  Per HPI unless specifically indicated above   Allergies as of 09/04/2021       Reactions   Actonel [risedronate] Nausea And Vomiting, Other (See Comments)   Throat tightness   Fosamax [alendronate Sodium] Other (See Comments)   esophagitis   Mevacor [lovastatin] Other (See Comments)   myalgias   Miacalcin [calcitonin (salmon)] Nausea And Vomiting   Mobic [meloxicam] Hives, Itching   rash        Medication List        Accurate as of September 04, 2021  1:59 PM. If you have any questions, ask your nurse or doctor.          STOP taking these medications    azithromycin 250 MG tablet Commonly known as: ZITHROMAX   benzonatate 100 MG capsule Commonly known as: TESSALON   predniSONE 5 MG tablet Commonly known as: DELTASONE       TAKE these medications    albuterol 108 (90 Base) MCG/ACT inhaler Commonly known as: VENTOLIN HFA INHALE TWO PUFFS BY MOUTH 4 TIMES DAILY AS NEEDED What changed: See the new instructions.   apixaban 5 MG Tabs tablet Commonly known as: ELIQUIS Take 1 tablet (5 mg total) by mouth 2 (two) times daily.   Breztri Aerosphere 160-9-4.8 MCG/ACT Aero Generic drug: Budeson-Glycopyrrol-Formoterol Inhale 1 puff into the lungs 2 (two) times daily.  Calcium Citrate-Vitamin D 315-250 MG-UNIT Tabs Commonly known as: Calcium Citrate + D Take 2 tablets by mouth daily.   cetirizine 10 MG tablet Commonly known as: ZYRTEC Take 5-10 mg by mouth at bedtime.   diclofenac Sodium 1 % Gel Commonly known as: Voltaren Apply 2 g topically 4 (four) times daily.   diltiazem 300 MG 24 hr capsule Commonly known as: CARDIZEM CD Take 1 capsule (300 mg total) by mouth daily.   esomeprazole 40 MG capsule Commonly known as: NEXIUM TAKE 1 CAPSULE BY MOUTH EVERY DAY   ferrous sulfate 325 (65 FE) MG  tablet Take 1 tablet (325 mg total) by mouth at bedtime.   ipratropium-albuterol 0.5-2.5 (3) MG/3ML Soln Commonly known as: DUONEB TAKE 3 MLS BY NEBULIZATION EVERY 6 (SIX) HOURS AS NEEDED. DX: ASTHMA 493.90   metoprolol tartrate 25 MG tablet Commonly known as: LOPRESSOR TAKE 1 TABLET (25 MG TOTAL) BY MOUTH DAILY AS NEEDED.   montelukast 10 MG tablet Commonly known as: SINGULAIR One tablet by mouth daily at bedtime   nitroGLYCERIN 0.4 MG/SPRAY spray Commonly known as: NITROLINGUAL ONE SPRAY UNDER TONGUE AS NEED FOR CHESTPAIN. MAY REPEAT IN 5 MINUTES IF PAIN NOT RELIEVED CALL 911.   PRESERVISION/LUTEIN PO Take 1 capsule by mouth at bedtime.   Prolia 60 MG/ML Sosy injection Generic drug: denosumab TO BE ADMINISTERED IN PHYSICIAN'S OFFICE. INJECT ONE SYRINGE SUBCOUSLY ONCE EVERY 6 MONTHS. REFRIGERATE. USE WITHIN 14 DAYS ONCE AT ROOM TEMPERATURE.   rosuvastatin 20 MG tablet Commonly known as: CRESTOR Take 1 tablet (20 mg total) by mouth daily.   Systane 0.4-0.3 % Soln Generic drug: Polyethyl Glycol-Propyl Glycol Place 1 drop into both eyes 2 (two) times daily.   Vitamin D 50 MCG (2000 UT) Caps Take 1 capsule by mouth at bedtime.         Objective:   BP 132/74    Pulse 75    Ht _0  (1.6 m)    Wt 167 lb (75.8 kg)    SpO2 96%    BMI 29.58 kg/m   Wt Readings from Last 3 Encounters:  09/04/21 167 lb (75.8 kg)  08/21/21 169 lb (76.7 kg)  08/09/21 170 lb (77.1 kg)    Physical Exam Vitals and nursing note reviewed.  Constitutional:      General: She is not in acute distress.    Appearance: She is well-developed. She is not diaphoretic.  Eyes:     Conjunctiva/sclera: Conjunctivae normal.  Cardiovascular:     Rate and Rhythm: Normal rate and regular rhythm.     Heart sounds: Murmur (Diastolic murmur best heard at right second intercostal) heard.  Pulmonary:     Effort: Pulmonary effort is normal. No respiratory distress.     Breath sounds: Normal breath sounds. No  wheezing.  Musculoskeletal:        General: No tenderness. Normal range of motion.  Skin:    General: Skin is warm and dry.     Findings: No rash.  Neurological:     Mental Status: She is alert and oriented to person, place, and time.     Coordination: Coordination normal.  Psychiatric:        Behavior: Behavior normal.      Assessment & Plan:   Problem List Items Addressed This Visit       Musculoskeletal and Integument   Osteoporosis   Relevant Orders   Vitamin D, 25-hydroxy     Other   Mixed hyperlipidemia - Primary   Relevant  Orders   CMP14+EGFR   Lipid panel   Anxiety state   Depression, recurrent ([place])   Relevant Orders   Vitamin D, 25-hydroxy   Hyperglycemia   Relevant Orders   Bayer DCA Hb A1c Waived   CMP14+EGFR   Other Visit Diagnoses     Need for shingles vaccine       Relevant Orders   Varicella-zoster vaccine IM (Shingrix) (Completed)       Continue current medicine, will check A1c because of elevated blood sugar.  She has an appointment with pulmonology but she also has an echocardiogram that she just had but has not been read.  Between cardiology and pulmonology will see if they can help find out why she is short of breath Follow up plan: No follow-ups on file.  Counseling provided for all of the vaccine components Orders Placed This Encounter  Procedures   Varicella-zoster vaccine IM (Shingrix)    Caryl Pina, MD [facility] 09/04/2021, 1:59 PM

## 2021-09-05 LAB — CMP14+EGFR
ALT: 24 IU/L (ref 0–32)
AST: 30 IU/L (ref 0–40)
Albumin/Globulin Ratio: 2.3 — ABNORMAL HIGH (ref 1.2–2.2)
Albumin: 4.4 g/dL (ref 3.6–4.6)
Alkaline Phosphatase: 74 IU/L (ref 44–121)
BUN/Creatinine Ratio: 28 (ref 12–28)
BUN: 21 mg/dL (ref 8–27)
Bilirubin Total: 0.4 mg/dL (ref 0.0–1.2)
CO2: 27 mmol/L (ref 20–29)
Calcium: 10 mg/dL (ref 8.7–10.3)
Chloride: 103 mmol/L (ref 96–106)
Creatinine, Ser: 0.76 mg/dL (ref 0.57–1.00)
Globulin, Total: 1.9 g/dL (ref 1.5–4.5)
Glucose: 97 mg/dL (ref 70–99)
Potassium: 4.7 mmol/L (ref 3.5–5.2)
Sodium: 143 mmol/L (ref 134–144)
Total Protein: 6.3 g/dL (ref 6.0–8.5)
eGFR: 78 mL/min/{1.73_m2} (ref >59)

## 2021-09-05 LAB — LIPID PANEL
Chol/HDL Ratio: 2.3 ratio (ref 0.0–4.4)
Cholesterol, Total: 140 mg/dL (ref 100–199)
HDL: 60 mg/dL (ref >39)
LDL Chol Calc (NIH): 59 mg/dL (ref 0–99)
Triglycerides: 119 mg/dL (ref 0–149)
VLDL Cholesterol Cal: 21 mg/dL (ref 5–40)

## 2021-09-05 LAB — VITAMIN D 25 HYDROXY (VIT D DEFICIENCY, FRACTURES): Vit D, 25-Hydroxy: 48.1 ng/mL (ref 30.0–100.0)

## 2021-09-12 ENCOUNTER — Other Ambulatory Visit: Payer: Self-pay | Admitting: Medical

## 2021-09-19 ENCOUNTER — Other Ambulatory Visit (HOSPITAL_COMMUNITY): Payer: Medicare HMO

## 2021-09-19 ENCOUNTER — Ambulatory Visit (HOSPITAL_COMMUNITY)
Admission: RE | Admit: 2021-09-19 | Discharge: 2021-09-19 | Disposition: A | Discharge status: Home / Self Care | Payer: Medicare HMO | Source: Ambulatory Visit | Attending: Cardiology | Admitting: Cardiology

## 2021-09-19 DIAGNOSIS — I480 Paroxysmal atrial fibrillation: Secondary | ICD-10-CM | POA: Insufficient documentation

## 2021-09-19 DIAGNOSIS — I351 Nonrheumatic aortic (valve) insufficiency: Secondary | ICD-10-CM | POA: Diagnosis not present

## 2021-09-19 DIAGNOSIS — R0602 Shortness of breath: Secondary | ICD-10-CM | POA: Insufficient documentation

## 2021-09-19 LAB — ECHOCARDIOGRAM COMPLETE
AR max vel: 1.64 cm2
AV Area VTI: 1.62 cm2
AV Area mean vel: 1.48 cm2
AV Mean grad: 9.5 mmHg
AV Peak grad: 18.5 mmHg
Ao pk vel: 2.15 m/s
Area-P 1/2: 4.54 cm2
MV VTI: 2.32 cm2
P 1/2 time: 969 msec
S' Lateral: 2.9 cm

## 2021-09-19 NOTE — Progress Notes (Signed)
*  PRELIMINARY RESULTS* Echocardiogram 2D Echocardiogram has been performed.  Krystal [name] 09/19/2021, 1:44 PM

## 2021-09-20 ENCOUNTER — Other Ambulatory Visit: Payer: Self-pay | Admitting: Family Medicine

## 2021-10-01 ENCOUNTER — Encounter (HOSPITAL_COMMUNITY): Payer: Self-pay | Admitting: Radiology

## 2021-10-04 DIAGNOSIS — H353124 Nonexudative age-related macular degeneration, left eye, advanced atrophic with subfoveal involvement: Secondary | ICD-10-CM | POA: Diagnosis not present

## 2021-10-04 DIAGNOSIS — H353211 Exudative age-related macular degeneration, right eye, with active choroidal neovascularization: Secondary | ICD-10-CM | POA: Diagnosis not present

## 2021-10-14 ENCOUNTER — Encounter: Payer: Self-pay | Admitting: Internal Medicine

## 2021-10-14 ENCOUNTER — Ambulatory Visit (INDEPENDENT_AMBULATORY_CARE_PROVIDER_SITE_OTHER): Payer: Medicare HMO | Admitting: Internal Medicine

## 2021-10-14 ENCOUNTER — Other Ambulatory Visit: Payer: Self-pay

## 2021-10-14 VITALS — BP 130/88 | HR 74 | Temp 98.7°F | Ht 63.0 in | Wt 169.0 lb

## 2021-10-14 DIAGNOSIS — R0609 Other forms of dyspnea: Secondary | ICD-10-CM

## 2021-10-14 MED ORDER — METHYLPREDNISOLONE ACETATE 80 MG/ML IJ SUSP
120.0000 mg | Freq: Once | INTRAMUSCULAR | Status: AC
  Administered 2021-10-14: 120 mg via INTRAMUSCULAR

## 2021-10-14 MED ORDER — FAMOTIDINE 20 MG PO TABS: ORAL_TABLET | ORAL | 11 refills | Status: DC

## 2021-10-14 NOTE — Assessment & Plan Note (Signed)
Onset Aug 2022 - Echo 09/19/21  Mild MR, nl LA - 10/14/2021   Walked on RA x 2   lap(s) =  approx 300  ft  @ slow  pace, stopped due to sob  with lowest 02 sats 96% - 10/14/2021 max rx for GERD   Symptoms are markedly disproportionate to objective findings and not clear to what extent this is actually a pulmonary  problem but pt does appear to have difficult to sort out respiratory symptoms of unknown origin for which  DDX  = almost all start with A and  include Adherence, Ace Inhibitors, Acid Reflux, Active Sinus Disease, Alpha 1 Antitripsin deficiency, Anxiety masquerading as Airways dz,  ABPA,  Allergy(esp in young), Aspiration (esp in elderly), Adverse effects of meds,  Active smoking or Vaping, A bunch of PE's/clot burden (a few small clots can't cause this syndrome unless there is already severe underlying pulm or vascular dz with poor reserve),  Anemia or thyroid disorder, plus two Bs  = Bronchiectasis and Beta blocker use..and one C= CHF     Adherence is always the initial "prime suspect" and is a multilayered concern that requires a "trust but verify" approach in every patient - starting with knowing how to use medications, especially inhalers, correctly, keeping up with refills and understanding the fundamental difference between maintenance and prns vs those medications only taken for a very short course and then stopped and not refilled.   - The proper method of use, as well as anticipated side effects, of a metered-dose inhaler were discussed and demonstrated to the patient using teach back method. Improved effectiveness after extensive coaching during this visit to a level of approximately 25 % from a baseline of 0 %> try neb pre ex  - return with all meds in hand  ? Acid (or non-acid) GERD > always difficult to exclude as up to 75% of pts in some series report no assoc GI/ Heartburn symptoms and she has a large HH > rec max (24h)  acid suppression and diet restrictions/ reviewed and  instructions given in writing.   ? Allergy / asthma suggested by longstanding allergic rhinitis but less convincing imrovement in ex tol p breztri/saba hfa so try off and use saba prn per neb Re SABA :  I spent extra time with pt today reviewing appropriate use of albuterol for prn use on exertion with the following points: 1) saba is for relief of sob that does not improve by walking a slower pace or resting but rather if the pt does not improve after trying this first. 2) If the pt is convinced, as many are, that saba helps recover from activity faster then it's easy to tell if this is the case by re-challenging : ie stop, take the inhaler, then p 5 minutes try the exact same activity (intensity of workload) that just caused the symptoms and see if they are substantially diminished or not after saba 3) if there is an activity that reproducibly causes the symptoms, try the saba 15 min before the activity on alternate days   If in fact the saba really does help, then fine to continue to use it prn but advised may need to look closer at the maintenance regimen being used to achieve better control of airways disease with exertion.   ? Adverse effects of meds > none of the usual suspects listed  ? Anemia/ thryoid dz > check labs  ? Anxiety > usually at the bottom of this list  of usual suspects but   may interfere with adherence and also interpretation of response or lack thereof to symptom management which can be quite subjective.   ? A bunch of PEs > on eliquis   ? CHF > only mild MR noted on echo 09/19/21 with nl LA noted

## 2021-10-14 NOTE — Patient Instructions (Addendum)
Stop breztri for now  ? ?Only use your albuterol as a rescue medication to be used if you can't catch your breath by resting or doing a relaxed purse lip breathing pattern.  ?- The less you use it, the better it will work when you need it. ?- Ok to use up to   every 4 hours if you must but call for immediate appointment if use goes up over your usual need ?- Don't leave home without it !!  (think of it like the spare tire for your car)  ? ?Ok to try albuterol 15 min before an activity (on alternating days)  that you know would usually make you short of breath and see if it makes any difference and if makes none then don't take albuterol after activity unless you can't catch your breath as this means it's the resting that helps, not the albuterol. ?    ? ? ?Nexium 40 mg   Take  30-60 min before first meal of the day and Pepcid (famotidine)  20 mg after supper until return to office - this is the best way to tell whether stomach acid is contributing to your problem.   ? ?GERD (REFLUX)  is an extremely common cause of respiratory symptoms just like yours , many times with no obvious heartburn at all.  ? ? It can be treated with medication, but also with lifestyle changes including elevation of the head of your bed (ideally with 6 -8inch blocks under the headboard of your bed),  Smoking cessation, avoidance of late meals, excessive alcohol, and avoid fatty foods, chocolate, peppermint, colas, red wine, and acidic juices such as orange juice.  ?NO MINT OR MENTHOL PRODUCTS SO NO COUGH DROPS  ?USE SUGARLESS CANDY INSTEAD (Jolley ranchers or Stover's or Life Savers) or even ice chips will also do - the key is to swallow to prevent all throat clearing. ?NO OIL BASED VITAMINS - use powdered substitutes.  Avoid fish oil when coughing.  ? ?Please remember to go to the lab department   for your tests - we will call you with the results when they are available. ?    ? ?Please remember to go to the  x-ray department at [facility]   for your tests - we will call you with the results when they are available. ? ?To get the most out of exercise, you need to be continuously aware that you are short of breath, but never out of breath, for at least 30 minutes daily. As you improve, it will actually be easier for you to do the same amount of exercise  in  30 minutes so always push to the level where you are short of breath.   ? ?Make sure you check your oxygen saturations at highest level of activity  ?    ? ?Please schedule a follow up office visit in 6 weeks, call sooner if needed with pfts  ?

## 2021-10-14 NOTE — Progress Notes (Unsigned)
Krystal [name], female    DOB: 1940/11/08,    MRN: [id number]   Brief patient profile:  70  yowf  minimal smoking hx  referred to pulmonary clinic in [place]  10/14/2021 by Dr Percival Spanish for unexplained doe with w/u only pos for Mild MR with nl LA  started in Aug 2022 but much worse since Sept 2022 and no better p 1st frost which usually  takes care of her allergies   Has been using albuterol up 3 daily x 10 years but can go months s it in winter  Neb works much better than hfa with poor hfa technique at 1st ov 10/14/2021    History of Present Illness  10/14/2021  Pulmonary/ 1st [name] eval/ Krystal [name] / Krystal [name]  Chief Complaint  Patient presents with   Consult    SOB and chest tightness since 04/2021  Dyspnea:  across parking lot doe and then in [name] on cart stopping every few min assoc with chest tightness no necessarily with or proportionate to ex  Cough: none Sleep: flat bed 2 pillows  awakens sev nights a week with chest tightness  SABA use: no less since started breztri / much less p pred rx  Much better also p neb but never rechallenges  No obvious day to day or daytime variability or assoc excess/ purulent sputum or mucus plugs or hemoptysis or cp or chest tightness, subjective wheeze or overt sinus or hb symptoms.     Also denies any obvious fluctuation of symptoms with weather or environmental changes or other aggravating or alleviating factors except as outlined above   No unusual exposure hx or h/o childhood pna/ asthma or knowledge of premature birth.  Current Allergies, Complete Past Medical History, Past Surgical History, Family History, and Social History were reviewed in [employer] record.  ROS  The following are not active complaints unless bolded Hoarseness, sore throat, dysphagia, dental problems, itching, sneezing,  nasal congestion or discharge of excess mucus or purulent secretions, ear ache,   fever, chills, sweats, unintended  wt loss or wt gain, classically pleuritic or exertional cp,  orthopnea pnd or arm/hand swelling  or leg swelling, presyncope, palpitations, abdominal pain, anorexia, nausea, vomiting, diarrhea  or change in bowel habits or change in bladder habits, change in stools or change in urine, dysuria, hematuria,  rash, arthralgias, visual complaints, headache, numbness, weakness or ataxia or problems with walking or coordination,  change in mood or  memory.           Past Medical History:  Diagnosis Date   Anemia    Asthma    Brain tumor ([place])    1986   Breast cancer ([place])    Right breast   Cataract    Colon polyps    COPD (chronic obstructive pulmonary disease) ([facility])    Hiatal hernia    Hyperlipidemia    Leg edema    Macular degeneration    Osteoporosis    Pneumonia    PVD (posterior vitreous detachment)    Right knee pain    Shingles rash     Outpatient Medications Prior to Visit  Medication Sig Dispense Refill   albuterol (VENTOLIN HFA) 108 (90 Base) MCG/ACT inhaler INHALE TWO PUFFS BY MOUTH 4 TIMES DAILY AS NEEDED 18 each 2   apixaban (ELIQUIS) 5 MG TABS tablet Take 1 tablet (5 mg total) by mouth 2 (two) times daily. 180 tablet 3   Budeson-Glycopyrrol-Formoterol (BREZTRI AEROSPHERE) 160-9-4.8 MCG/ACT AERO Inhale  1 puff into the lungs 2 (two) times daily. 10.7 g 11   Calcium Citrate-Vitamin D (CALCIUM CITRATE + D) 315-250 MG-UNIT TABS Take 2 tablets by mouth daily. 120 tablet           Cholecalciferol (VITAMIN D) 2000 UNITS CAPS Take 1 capsule by mouth at bedtime.     denosumab (PROLIA) 60 MG/ML SOSY injection TO BE ADMINISTERED IN PHYSICIAN'S [name]. INJECT ONE SYRINGE SUBCOUSLY ONCE EVERY 6 MONTHS. REFRIGERATE. USE WITHIN 14 DAYS ONCE AT ROOM TEMPERATURE. 1 mL 1   diclofenac Sodium (VOLTAREN) 1 % GEL Apply 2 g topically 4 (four) times daily. 350 g 3   diltiazem (CARDIZEM CD) 300 MG 24 hr capsule Take 1 capsule (300 mg total) by mouth daily. 90 capsule 3   esomeprazole (NEXIUM) 40 MG  capsule TAKE 1 CAPSULE BY MOUTH EVERY DAY 90 capsule 3   ferrous sulfate 325 (65 FE) MG tablet Take 1 tablet (325 mg total) by mouth at bedtime. 90 tablet 3   ipratropium-albuterol (DUONEB) 0.5-2.5 (3) MG/3ML SOLN TAKE 3 MLS BY NEBULIZATION EVERY 6 (SIX) HOURS AS NEEDED. DX: ASTHMA 493.90 360 mL 0   metoprolol tartrate (LOPRESSOR) 25 MG tablet TAKE 1 TABLET (25 MG TOTAL) BY MOUTH DAILY AS NEEDED. 90 tablet 3   montelukast (SINGULAIR) 10 MG tablet One tablet by mouth daily at bedtime 90 tablet 3   Multiple Vitamins-Minerals (PRESERVISION/LUTEIN PO) Take 1 capsule by mouth at bedtime.      nitroGLYCERIN (NITROLINGUAL) 0.4 MG/SPRAY spray ONE SPRAY UNDER TONGUE AS NEED FOR CHESTPAIN. MAY REPEAT IN 5 MINUTES IF PAIN NOT RELIEVED CALL 911. 12 g 3   Polyethyl Glycol-Propyl Glycol (SYSTANE) 0.4-0.3 % SOLN Place 1 drop into both eyes 2 (two) times daily.     rosuvastatin (CRESTOR) 20 MG tablet TAKE 1 TABLET BY MOUTH EVERY DAY 90 tablet 1   No facility-administered medications prior to visit.     Objective:     BP 130/88 (BP Location: Left Arm, Patient Position: Sitting)    Pulse 74    Temp 98.7 F (37.1 C) (Temporal)    Ht '5\' 3"'$  (1.6 m)    Wt 169 lb (76.7 kg)    SpO2 97% Comment: ra   BMI 29.94 kg/m   SpO2: 97 % (ra)  Amb wf nad /walks with 2 wheeled walker, awkwardly getting out of chair to exam table   HEENT : pt wearing mask not removed for exam due to covid -19 concerns.    NECK :  without JVD/Nodes/TM/ nl carotid upstrokes bilaterally   LUNGS: no acc muscle use,  Nl contour chest which is clear to A and P bilaterally without cough on insp or exp maneuvers   CV:  RRR  no s3 or murmur or increase in P2, and no edema   ABD:  soft and nontender with nl inspiratory excursion in the supine position. No bruits or organomegaly appreciated, bowel sounds nl  MS:  Nl gait/ ext warm without deformities, calf tenderness, cyanosis or clubbing No obvious joint restrictions   SKIN: warm and dry  without lesions    NEURO:  alert, approp, nl sensorium with  no motor or cerebellar deficits apparent.   CXR PA and Lateral:   10/14/2021 :    I personally reviewed images and agree with radiology impression as follows:       Assessment   No problem-specific Assessment & Plan notes found for this encounter.     Christinia Gully, MD 10/14/2021

## 2021-10-15 ENCOUNTER — Ambulatory Visit (HOSPITAL_COMMUNITY)
Admission: RE | Admit: 2021-10-15 | Discharge: 2021-10-15 | Disposition: A | Discharge status: Home / Self Care | Payer: Medicare HMO | Source: Ambulatory Visit | Attending: Internal Medicine | Admitting: Internal Medicine

## 2021-10-15 DIAGNOSIS — K449 Diaphragmatic hernia without obstruction or gangrene: Secondary | ICD-10-CM | POA: Diagnosis not present

## 2021-10-15 DIAGNOSIS — R0600 Dyspnea, unspecified: Secondary | ICD-10-CM | POA: Diagnosis not present

## 2021-10-15 DIAGNOSIS — I517 Cardiomegaly: Secondary | ICD-10-CM | POA: Diagnosis not present

## 2021-10-15 DIAGNOSIS — R0609 Other forms of dyspnea: Secondary | ICD-10-CM | POA: Diagnosis not present

## 2021-10-16 ENCOUNTER — Encounter: Payer: Self-pay | Admitting: Internal Medicine

## 2021-10-16 LAB — CBC WITH DIFFERENTIAL/PLATELET
Basophils Absolute: 0 10*3/uL (ref 0.0–0.2)
Basos: 1 %
EOS (ABSOLUTE): 0.3 10*3/uL (ref 0.0–0.4)
Eos: 5 %
Hematocrit: 44.5 % (ref 34.0–46.6)
Hemoglobin: 14.5 g/dL (ref 11.1–15.9)
Immature Grans (Abs): 0 10*3/uL (ref 0.0–0.1)
Immature Granulocytes: 0 %
Lymphocytes Absolute: 2.4 10*3/uL (ref 0.7–3.1)
Lymphs: 32 %
MCH: 29.2 pg (ref 26.6–33.0)
MCHC: 32.6 g/dL (ref 31.5–35.7)
MCV: 90 fL (ref 79–97)
Monocytes Absolute: 0.6 10*3/uL (ref 0.1–0.9)
Monocytes: 8 %
Neutrophils Absolute: 4.1 10*3/uL (ref 1.4–7.0)
Neutrophils: 54 %
Platelets: 161 10*3/uL (ref 150–450)
RBC: 4.97 x10E6/uL (ref 3.77–5.28)
RDW: 12.8 % (ref 11.7–15.4)
WBC: 7.4 10*3/uL (ref 3.4–10.8)

## 2021-10-16 LAB — SEDIMENTATION RATE: Sed Rate: 4 mm/hr (ref 0–40)

## 2021-10-16 LAB — TSH: TSH: 2.42 u[IU]/mL (ref 0.450–4.500)

## 2021-10-16 LAB — IGE: IgE (Immunoglobulin E), Serum: 30 IU/mL (ref 6–495)

## 2021-10-21 ENCOUNTER — Other Ambulatory Visit: Payer: Self-pay | Admitting: Family Medicine

## 2021-10-25 ENCOUNTER — Telehealth: Payer: Self-pay | Admitting: Family Medicine

## 2021-10-25 NOTE — Telephone Encounter (Signed)
Alaura (patients nurse case manager) called wanting to know if we had patients Ejection Fraction on file, and if so, can we call her with that info. ? ?Her # is [phone] and it is a secure phone so we can leave detailed message. ?

## 2021-10-25 NOTE — Telephone Encounter (Signed)
Left message informing that Dr. Percival Spanish did last scan. ? ?Left side 60-65% and right normal. ? ?Informed that we could fax info if needed. ?

## 2021-11-13 ENCOUNTER — Ambulatory Visit: Payer: Medicare HMO | Admitting: Internal Medicine

## 2021-11-13 DIAGNOSIS — R0609 Other forms of dyspnea: Secondary | ICD-10-CM

## 2021-11-13 LAB — PULMONARY FUNCTION TEST
DL/VA % pred: 108 %
DL/VA: 4.45 ml/min/mmHg/L
DLCO cor % pred: 50 %
DLCO cor: 9.14 ml/min/mmHg
DLCO unc % pred: 52 %
DLCO unc: 9.43 ml/min/mmHg
FEF 25-75 Post: 1.4 L/sec
FEF 25-75 Pre: 1.05 L/sec
FEF2575-%Change-Post: 32 %
FEF2575-%Pred-Post: 111 %
FEF2575-%Pred-Pre: 84 %
FEV1-%Change-Post: 9 %
FEV1-%Pred-Post: 76 %
FEV1-%Pred-Pre: 69 %
FEV1-Post: 1.36 L
FEV1-Pre: 1.24 L
FEV1FVC-%Change-Post: -3 %
FEV1FVC-%Pred-Pre: 102 %
FEV6-%Change-Post: 13 %
FEV6-%Pred-Post: 83 %
FEV6-%Pred-Pre: 72 %
FEV6-Post: 1.88 L
FEV6-Pre: 1.65 L
FEV6FVC-%Pred-Post: 106 %
FEV6FVC-%Pred-Pre: 106 %
FVC-%Change-Post: 13 %
FVC-%Pred-Post: 78 %
FVC-%Pred-Pre: 68 %
FVC-Post: 1.88 L
FVC-Pre: 1.65 L
Post FEV1/FVC ratio: 72 %
Post FEV6/FVC ratio: 100 %
Pre FEV1/FVC ratio: 75 %
Pre FEV6/FVC Ratio: 100 %
RV % pred: 161 %
RV: 3.85 L
TLC % pred: 129 %
TLC: 6.36 L

## 2021-11-20 ENCOUNTER — Ambulatory Visit (INDEPENDENT_AMBULATORY_CARE_PROVIDER_SITE_OTHER): Payer: Medicare HMO | Admitting: Internal Medicine

## 2021-11-20 ENCOUNTER — Encounter: Payer: Self-pay | Admitting: Internal Medicine

## 2021-11-20 DIAGNOSIS — R0609 Other forms of dyspnea: Secondary | ICD-10-CM

## 2021-11-20 DIAGNOSIS — R058 Other specified cough: Secondary | ICD-10-CM | POA: Diagnosis not present

## 2021-11-20 NOTE — Patient Instructions (Addendum)
Try off breztri to see what difference it makes with your symptoms or needed albuterol and if change for the worse, restart Breztri Take 2 puffs first thing in am and then another 2 puffs about 12 hours later. ? ?Continue singulair and For drainage / throat tickle try take CHLORPHENIRAMINE  4 mg  ("Allergy Relief" '4mg'$   at [facility] should be easiest to find in the blue box usually on bottom shelf)  take one every 4 hours as needed - extremely effective and inexpensive over the counter- may cause drowsiness so start with just a dose or two an hour before bedtime and see how you tolerate it before trying in daytime.  ? ? ?We will walk walk you today for a baseline  ?  ? ?Please schedule a follow up visit in 3 months but call sooner if needed  ? ? ?  ?  ?

## 2021-11-20 NOTE — Assessment & Plan Note (Addendum)
Trial of 1st gen H1  Plus singulair and stop zyrtec  11/20/2021 >>>  ? ?Advised: ?For drainage / throat tickle try take CHLORPHENIRAMINE  4 mg  ("Allergy Relief" '4mg'$   at [facility] should be easiest to find in the blue box usually on bottom shelf)  take one every 4 hours as needed - extremely effective and inexpensive over the counter- may cause drowsiness so start with just a dose or two an hour before bedtime and see how you tolerate it before trying in daytime.  ? ?F/u q 3 m, sooner if needed ? ?    ?  ? ? Each maintenance medication was reviewed in detail including emphasizing most importantly the difference between maintenance and prns and under what circumstances the prns are to be triggered using an action plan format where appropriate. ? ?Total time for H and P, chart review, counseling, reviewing hfa device(s) , directly observing portions of ambulatory 02 saturation study/ and generating customized AVS unique to this office visit / same day charting > 30 min   ?     ?  ?      ?     ?

## 2021-11-20 NOTE — Progress Notes (Signed)
? ?Krystal [name], female    DOB: June 02, 1941,    MRN: [id number] ? ? 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 ?Has been using albuterol up 3 daily x 10 years but can go months s it in winter  ?Neb works much better than hfa with poor hfa technique at 1st ov 10/14/2021  ? ? ?History of Present Illness  ?10/14/2021  Pulmonary/ 1st office eval/ Krystal [name] / [name]  ?Chief Complaint  ?Patient presents with  ? Consult  ?  SOB and chest tightness since 04/2021  ?Dyspnea:  across parking lot doe and then in [place] leans on cart stopping every few min assoc with chest tightness no necessarily with or proportionate to ex  ?Cough: none ?Sleep: flat bed 2 pillows  awakens sev nights a week with chest tightness  ?SABA use: no less since started breztri / much less p pred rx  ?Much better also p neb but never rechallenges ?Rec ?Stop breztri for now  ?Only use your albuterol as a rescue medication  ?Ok to try albuterol 15 min before an activity (on alternating days)  that you know would usually make you short of breath ?Nexium 40 mg   Take  30-60 min before first meal of the day and Pepcid (famotidine)  20 mg after supper until return to office  ?GERD diet reviewed, bed blocks rec   ? Please remember to go to the  x-ray department at [facility]   ?To get the most out of exercise, you need to be continuously aware that you are short of breath, but never out of breath, for at least 30 minutes daily. ?Make sure you check your oxygen saturations at highest level of activity  ?    ? ?Please schedule a follow up office visit in 6 weeks with pfts > nl  ? ? ?11/20/2021  f/u ov/Krystal [name] office/Krystal [name] re: doe maint on breztri ? Really helping ?Chief Complaint  ?Patient  presents with  ? Follow-up  ?  SOB and chest tightness are same since last ov.  ?PFT done on 11/13/2021  ? Dyspnea:  leans on cart at [place] does one aisle  ?Cough: mostly just pnds / esp in evening no better with zyrtec/ singulair  ?Sleeping: flat bed 2 pillows  ?SABA use: tid hfa/ neb after supper  ?02: none  ?Covid status: "had them all " ?  ? ? ?No obvious day to day or daytime variability or assoc excess/ purulent sputum or mucus plugs or hemoptysis or cp or chest tightness, subjective wheeze or overt sinus or hb symptoms.  ? ?Sleeping  without nocturnal  or early am exacerbation  of respiratory  c/o's or need for noct saba. Also denies any obvious fluctuation of symptoms with weather or environmental changes or other aggravating or alleviating factors except as outlined above  ? ?No unusual exposure hx or h/o childhood pna/ asthma or knowledge of premature birth. ? ?Current Allergies, Complete Past Medical History, Past Surgical History, Family History, and Social History were reviewed in [employer] record. ? ?ROS  The following are not active complaints unless bolded ?Hoarseness, sore throat, dysphagia, dental problems, itching, sneezing,  nasal  congestion or discharge of excess mucus or purulent secretions, ear ache,   fever, chills, sweats, unintended wt loss or wt gain, classically pleuritic or exertional cp,  orthopnea pnd or arm/hand swelling  or leg swelling, presyncope, palpitations, abdominal pain, anorexia, nausea, vomiting, diarrhea  or change in bowel habits or change in bladder habits, change in stools or change in urine, dysuria, hematuria,  rash, arthralgias, visual complaints, headache, numbness, weakness or ataxia or problems with walking or coordination,  change in mood or  memory. ?      ? ?Current Meds  ?Medication Sig  ? albuterol (VENTOLIN HFA) 108 (90 Base) MCG/ACT inhaler INHALE TWO PUFFS BY MOUTH 4 TIMES DAILY AS NEEDED  ? apixaban (ELIQUIS) 5 MG TABS tablet  Take 1 tablet (5 mg total) by mouth 2 (two) times daily.  ? Calcium Citrate-Vitamin D (CALCIUM CITRATE + D) 315-250 MG-UNIT TABS Take 2 tablets by mouth daily.  ? cetirizine (ZYRTEC) 10 MG tablet Take 5-10 mg by mouth at bedtime.   ? Cholecalciferol (VITAMIN D) 2000 UNITS CAPS Take 1 capsule by mouth at bedtime.  ? denosumab (PROLIA) 60 MG/ML SOSY injection TO BE ADMINISTERED IN PHYSICIAN'S OFFICE. INJECT ONE SYRINGE SUBCOUSLY ONCE EVERY 6 MONTHS. REFRIGERATE. USE WITHIN 14 DAYS ONCE AT ROOM TEMPERATURE.  ? diclofenac Sodium (VOLTAREN) 1 % GEL Apply 2 g topically 4 (four) times daily.  ? diltiazem (CARDIZEM CD) 300 MG 24 hr capsule Take 1 capsule (300 mg total) by mouth daily.  ? esomeprazole (NEXIUM) 40 MG capsule TAKE 1 CAPSULE BY MOUTH EVERY DAY  ? famotidine (PEPCID) 20 MG tablet One after supper  ? ferrous sulfate 325 (65 FE) MG tablet Take 1 tablet (325 mg total) by mouth at bedtime.  ? ipratropium-albuterol (DUONEB) 0.5-2.5 (3) MG/3ML SOLN TAKE 3 MLS BY NEBULIZATION EVERY 6 (SIX) HOURS AS NEEDED. DX: ASTHMA 493.90  ? metoprolol tartrate (LOPRESSOR) 25 MG tablet TAKE 1 TABLET BY MOUTH DAILY AS NEEDED.  ? montelukast (SINGULAIR) 10 MG tablet One tablet by mouth daily at bedtime  ? Multiple Vitamins-Minerals (PRESERVISION/LUTEIN PO) Take 1 capsule by mouth at bedtime.   ? nitroGLYCERIN (NITROLINGUAL) 0.4 MG/SPRAY spray ONE SPRAY UNDER TONGUE AS NEED FOR CHESTPAIN. MAY REPEAT IN 5 MINUTES IF PAIN NOT RELIEVED CALL 911.  ? Polyethyl Glycol-Propyl Glycol (SYSTANE) 0.4-0.3 % SOLN Place 1 drop into both eyes 2 (two) times daily.  ? rosuvastatin (CRESTOR) 20 MG tablet TAKE 1 TABLET BY MOUTH EVERY DAY  ? [DISCONTINUED] Budeson-Glycopyrrol-Formoterol (BREZTRI AEROSPHERE) 160-9-4.8 MCG/ACT AERO Inhale 1 puff into the lungs 2 (two) times daily.  ?     ? ?  ?      ?   ? ?Past Medical History:  ?Diagnosis Date  ? Anemia   ? Asthma   ? Brain tumor ([place])   ? 1986  ? Breast cancer ([place])   ? Right breast  ? Cataract   ? Colon  polyps   ? COPD (chronic obstructive pulmonary disease) ([place])   ? Hiatal hernia   ? Hyperlipidemia   ? Leg edema   ? Macular degeneration   ? Osteoporosis   ? Pneumonia   ? PVD (posterior vitreous detachment)   ? Right knee pain   ? Shingles rash   ?  ? ? ?Objective:  ?  ? ?  ?Wt Readings from Last 3 Encounters:  ?11/20/21 162 lb 9.6 oz (73.8 kg)  ?10/14/21 169 lb (76.7 kg)  ?09/04/21 167 lb (75.8 kg)  ?  ? ? ?  Vital signs reviewed  11/20/2021  - Note at rest 02 sats  96% on RA  ? ?General appearance:    amb with rollator wf nad   ? ? HEENT : nl exam   ? ? ?NECK :  without JVD/Nodes/TM/ nl carotid upstrokes bilaterally ? ? ?LUNGS: no acc muscle use,  Mod kyphotic  contour chest which is clear to A and P bilaterally without cough on insp or exp maneuvers ? ? ?CV:  RRR  no s3 or murmur or increase in P2, and no edema  ? ?ABD:  soft and nontender with nl inspiratory excursion in the supine position. No bruits or organomegaly appreciated, bowel sounds nl ? ?MS:  Nl gait/ ext warm without deformities, calf tenderness, cyanosis or clubbing ?No obvious joint restrictions  ? ?SKIN: warm and dry without lesions   ? ?NEURO:  alert, approp, nl sensorium with  no motor or cerebellar deficits apparent.  ? ? ? ?CXR PA and Lateral:   10/15/2021 :    ?I personally reviewed images and impression is as follows:     ?Large HH/ mod kyphosis no acute changes   ?1. Cardiomegaly without acute abnormality of the lungs. ?2. Large hiatal hernia. ? ? ?Assessment  ?  ?   ?  ?

## 2021-11-20 NOTE — Assessment & Plan Note (Addendum)
Onset Aug 2022 ?- Echo 09/19/21  Mild MR, nl LA ?- 10/14/2021   Walked on RA x 2   lap(s) =  approx 300  ft  @ slow  pace, stopped due to sob  with lowest 02 sats 96% ?- 10/14/2021 max rx for GERD ?-  Allergy screen 10/14/2021 >  Eos 0.3 /  IgE 30  ?- 11/20/2021   Walked on RA  x  one  lap(s) =  approx 150  ft  @ slow pace, stopped due to sob with lowest 02 sats 95%   ? ? ?Way over using saba for unclear reasons when main problem is chronic predictable / proportionate (non variable) doe which is not typical of Asthma ?And she clearly does not have copd so should not need breztri. ? ?- The proper method of use, as well as anticipated side effects, of a metered-dose inhaler were discussed and demonstrated to the patient using teach back method  ? ?rec try off breztri to see what if any change occurs in symptoms or saba need as she may not really need any maint rx at all  (try off breztri)  ? ?Re SABA :  I spent extra time with pt today reviewing appropriate use of albuterol for prn use on exertion with the following points: ?1) saba is for relief of sob that does not improve by walking a slower pace or resting but rather if the pt does not improve after trying this first. ?2) If the pt is convinced, as many are, that saba helps recover from activity faster then it's easy to tell if this is the case by re-challenging : ie stop, take the inhaler, then p 5 minutes try the exact same activity (intensity of workload) that just caused the symptoms and see if they are substantially diminished or not after saba ?3) if there is an activity that reproducibly causes the symptoms, try the saba 15 min before the activity on alternate days  ? ?If in fact the saba really does help, then fine to continue to use it prn but advised may need to look closer at the maintenance regimen being used to achieve better control of airways disease with exertion.  ?

## 2021-12-10 ENCOUNTER — Other Ambulatory Visit: Payer: Self-pay | Admitting: Family Medicine

## 2021-12-16 DIAGNOSIS — H35373 Puckering of macula, bilateral: Secondary | ICD-10-CM | POA: Diagnosis not present

## 2021-12-16 DIAGNOSIS — H43813 Vitreous degeneration, bilateral: Secondary | ICD-10-CM | POA: Diagnosis not present

## 2021-12-16 DIAGNOSIS — H353124 Nonexudative age-related macular degeneration, left eye, advanced atrophic with subfoveal involvement: Secondary | ICD-10-CM | POA: Diagnosis not present

## 2021-12-16 DIAGNOSIS — H353211 Exudative age-related macular degeneration, right eye, with active choroidal neovascularization: Secondary | ICD-10-CM | POA: Diagnosis not present

## 2022-01-01 ENCOUNTER — Other Ambulatory Visit: Payer: Self-pay | Admitting: Family Medicine

## 2022-01-31 ENCOUNTER — Emergency Department (HOSPITAL_COMMUNITY): Payer: Medicare HMO

## 2022-01-31 ENCOUNTER — Emergency Department (HOSPITAL_COMMUNITY)
Admission: EM | Admit: 2022-01-31 | Discharge: 2022-01-31 | Disposition: A | Discharge status: Home / Self Care | Payer: Medicare HMO | Attending: Emergency Medicine | Admitting: Emergency Medicine

## 2022-01-31 ENCOUNTER — Encounter (HOSPITAL_COMMUNITY): Payer: Self-pay | Admitting: Emergency Medicine

## 2022-01-31 ENCOUNTER — Other Ambulatory Visit: Payer: Self-pay

## 2022-01-31 DIAGNOSIS — R1013 Epigastric pain: Secondary | ICD-10-CM | POA: Diagnosis not present

## 2022-01-31 DIAGNOSIS — R609 Edema, unspecified: Secondary | ICD-10-CM | POA: Diagnosis not present

## 2022-01-31 DIAGNOSIS — K449 Diaphragmatic hernia without obstruction or gangrene: Secondary | ICD-10-CM | POA: Diagnosis not present

## 2022-01-31 DIAGNOSIS — Z7901 Long term (current) use of anticoagulants: Secondary | ICD-10-CM | POA: Insufficient documentation

## 2022-01-31 DIAGNOSIS — Z79899 Other long term (current) drug therapy: Secondary | ICD-10-CM | POA: Insufficient documentation

## 2022-01-31 DIAGNOSIS — R600 Localized edema: Secondary | ICD-10-CM | POA: Diagnosis not present

## 2022-01-31 DIAGNOSIS — M7989 Other specified soft tissue disorders: Secondary | ICD-10-CM | POA: Diagnosis not present

## 2022-01-31 DIAGNOSIS — R0602 Shortness of breath: Secondary | ICD-10-CM | POA: Diagnosis not present

## 2022-01-31 LAB — COMPREHENSIVE METABOLIC PANEL
ALT: 22 U/L (ref 0–44)
AST: 30 U/L (ref 15–41)
Albumin: 3.9 g/dL (ref 3.5–5.0)
Alkaline Phosphatase: 68 U/L (ref 38–126)
Anion gap: 7 (ref 5–15)
BUN: 12 mg/dL (ref 8–23)
CO2: 27 mmol/L (ref 22–32)
Calcium: 9.4 mg/dL (ref 8.9–10.3)
Chloride: 104 mmol/L (ref 98–111)
Creatinine, Ser: 0.78 mg/dL (ref 0.44–1.00)
GFR, Estimated: >60 mL/min (ref >60)
Glucose, Bld: 110 mg/dL — ABNORMAL HIGH (ref 70–99)
Potassium: 3.9 mmol/L (ref 3.5–5.1)
Sodium: 138 mmol/L (ref 135–145)
Total Bilirubin: 0.8 mg/dL (ref 0.3–1.2)
Total Protein: 6.8 g/dL (ref 6.5–8.1)

## 2022-01-31 LAB — CBC WITH DIFFERENTIAL/PLATELET
Abs Immature Granulocytes: 0.03 10*3/uL (ref 0.00–0.07)
Basophils Absolute: 0 10*3/uL (ref 0.0–0.1)
Basophils Relative: 0 %
Eosinophils Absolute: 0.1 10*3/uL (ref 0.0–0.5)
Eosinophils Relative: 1 %
HCT: 40.9 % (ref 36.0–46.0)
Hemoglobin: 13.3 g/dL (ref 12.0–15.0)
Immature Granulocytes: 0 %
Lymphocytes Relative: 25 %
Lymphs Abs: 1.9 10*3/uL (ref 0.7–4.0)
MCH: 30.2 pg (ref 26.0–34.0)
MCHC: 32.5 g/dL (ref 30.0–36.0)
MCV: 93 fL (ref 80.0–100.0)
Monocytes Absolute: 0.6 10*3/uL (ref 0.1–1.0)
Monocytes Relative: 8 %
Neutro Abs: 4.9 10*3/uL (ref 1.7–7.7)
Neutrophils Relative %: 66 %
Platelets: 171 10*3/uL (ref 150–400)
RBC: 4.4 MIL/uL (ref 3.87–5.11)
RDW: 13.6 % (ref 11.5–15.5)
WBC: 7.6 10*3/uL (ref 4.0–10.5)
nRBC: 0 % (ref 0.0–0.2)

## 2022-01-31 LAB — URINALYSIS, ROUTINE W REFLEX MICROSCOPIC
Bilirubin Urine: NEGATIVE
Glucose, UA: NEGATIVE mg/dL
Hgb urine dipstick: NEGATIVE
Ketones, ur: NEGATIVE mg/dL
Leukocytes,Ua: NEGATIVE
Nitrite: NEGATIVE
Protein, ur: NEGATIVE mg/dL
Specific Gravity, Urine: 1.004 — ABNORMAL LOW (ref 1.005–1.030)
pH: 8 (ref 5.0–8.0)

## 2022-01-31 LAB — TROPONIN I (HIGH SENSITIVITY): Troponin I (High Sensitivity): 4 ng/L (ref <18)

## 2022-01-31 LAB — BRAIN NATRIURETIC PEPTIDE: B Natriuretic Peptide: 114 pg/mL — ABNORMAL HIGH (ref 0.0–100.0)

## 2022-01-31 LAB — LIPASE, BLOOD: Lipase: 34 U/L (ref 11–51)

## 2022-01-31 MED ORDER — POTASSIUM CHLORIDE CRYS ER 20 MEQ PO TBCR: 20.0000 meq | EXTENDED_RELEASE_TABLET | Freq: Every day | ORAL | 0 refills | Status: DC

## 2022-01-31 MED ORDER — FUROSEMIDE 10 MG/ML IJ SOLN
20.0000 mg | Freq: Once | INTRAMUSCULAR | Status: AC
  Administered 2022-01-31: 20 mg via INTRAVENOUS
  Filled 2022-01-31: qty 2

## 2022-01-31 MED ORDER — FUROSEMIDE 20 MG PO TABS: 20.0000 mg | ORAL_TABLET | Freq: Every morning | ORAL | 0 refills | Status: DC

## 2022-01-31 NOTE — ED Triage Notes (Signed)
Pt c/o abd swelling and bilateral lower legs since yesterday.

## 2022-01-31 NOTE — ED Notes (Signed)
Dc instructions and scripts reviewed with pt. Pt will follow up with PCP next week. Pt declined wheel chair and walked out of ED with walker and family at bedside.

## 2022-01-31 NOTE — Discharge Instructions (Addendum)
You are seen in the emergency department for evaluation of swelling of your ankles.  You had blood work EKG and a chest x-ray that did not show any significant abnormalities.  We started you on a water pill for the next few days.  Please elevate your legs.  Contact your primary care doctor on Monday for close follow-up.  Return if any worsening or concerning symptoms

## 2022-01-31 NOTE — ED Provider Notes (Signed)
[facility] EMERGENCY DEPARTMENT Provider Note   CSN: [id number] Arrival date & time: 01/31/22  1907     History {Add pertinent medical, surgical, social history, OB history to HPI:1} Chief Complaint  Patient presents with   Leg Swelling    Krystal [name] is a 81 y.o. female.  She is here with a complaint of increased swelling in her feet and ankles and feeling like her abdomen is slightly distended.  She is also endorsing a little bit of upper abdominal pain.  The symptoms started yesterday.  She said the last time this happened she was in atrial fibrillation.  She said she is not having any chest pain or shortness of breath now but sometimes gets them.  No fevers or chills.  No change in her medications.  No nausea vomiting or diarrhea.  She does not check her weights daily.  She is not on a diuretic.  She is on anticoagulation for atrial fibrillation.  Cardiologist is Dr. Percival Spanish.  The history is provided by the patient and a relative.  Abdominal Pain Pain location:  Epigastric Pain quality: aching   Pain severity:  Mild Timing:  Intermittent Progression:  Unchanged Chronicity:  New Relieved by:  None tried Worsened by:  Nothing Ineffective treatments:  None tried Associated symptoms: shortness of breath   Associated symptoms: no cough, no diarrhea, no dysuria, no fever, no nausea and no vomiting        Home Medications Prior to Admission medications   Medication Sig Start Date End Date Taking? Authorizing Provider  albuterol (VENTOLIN HFA) 108 (90 Base) MCG/ACT inhaler INHALE TWO PUFFS BY MOUTH 4 TIMES DAILY AS NEEDED 09/20/21   Dettinger, Fransisca Kaufmann, MD  apixaban (ELIQUIS) 5 MG TABS tablet Take 1 tablet (5 mg total) by mouth 2 (two) times daily. 03/22/21   Furth, Cadence H, PA-C  Calcium Citrate-Vitamin D (CALCIUM CITRATE + D) 315-250 MG-UNIT TABS Take 2 tablets by mouth daily. 12/10/15   Eckard, Tammy, RPH-CPP  cetirizine (ZYRTEC) 10 MG tablet Take 5-10 mg by mouth at  bedtime.     [provider]  Cholecalciferol (VITAMIN D) 2000 UNITS CAPS Take 1 capsule by mouth at bedtime.    [provider]  denosumab (PROLIA) 60 MG/ML SOSY injection TO BE ADMINISTERED IN PHYSICIAN'S OFFICE. INJECT ONE SYRINGE SUBCOUSLY ONCE EVERY 6 MONTHS. REFRIGERATE. USE WITHIN 14 DAYS ONCE AT ROOM TEMPERATURE. 06/12/21   Dettinger, Fransisca Kaufmann, MD  diclofenac Sodium (VOLTAREN) 1 % GEL Apply 2 g topically 4 (four) times daily. 11/05/20   Dettinger, Fransisca Kaufmann, MD  diltiazem (CARDIZEM CD) 300 MG 24 hr capsule TAKE 1 CAPSULE BY MOUTH EVERY DAY 12/10/21   Dettinger, Fransisca Kaufmann, MD  esomeprazole (NEXIUM) 40 MG capsule TAKE 1 CAPSULE BY MOUTH EVERY DAY 12/10/21   Dettinger, Fransisca Kaufmann, MD  famotidine (PEPCID) 20 MG tablet One after supper 10/14/21   Tanda Rockers, MD  ferrous sulfate 325 (65 FE) MG tablet Take 1 tablet (325 mg total) by mouth at bedtime. 11/05/20   Dettinger, Fransisca Kaufmann, MD  ipratropium-albuterol (DUONEB) 0.5-2.5 (3) MG/3ML SOLN TAKE 3 MLS BY NEBULIZATION EVERY 6 (SIX) HOURS AS NEEDED. DX: ASTHMA 493.90 05/21/21   Dettinger, Fransisca Kaufmann, MD  metoprolol tartrate (LOPRESSOR) 25 MG tablet TAKE 1 TABLET BY MOUTH EVERY DAY AS NEEDED 01/01/22   Dettinger, Fransisca Kaufmann, MD  montelukast (SINGULAIR) 10 MG tablet TAKE 1 TABLET BY MOUTH AT BEDTIME 12/10/21   Dettinger, Fransisca Kaufmann, MD  Multiple Vitamins-Minerals (PRESERVISION/LUTEIN  PO) Take 1 capsule by mouth at bedtime.     [provider]  nitroGLYCERIN (NITROLINGUAL) 0.4 MG/SPRAY spray ONE SPRAY UNDER TONGUE AS NEED FOR CHESTPAIN. MAY REPEAT IN 5 MINUTES IF PAIN NOT RELIEVED CALL 911. 08/22/21   Minus Breeding, MD  Polyethyl Glycol-Propyl Glycol (SYSTANE) 0.4-0.3 % SOLN Place 1 drop into both eyes 2 (two) times daily.    [provider]  rosuvastatin (CRESTOR) 20 MG tablet TAKE 1 TABLET BY MOUTH EVERY DAY 09/12/21   Minus Breeding, MD      Allergies    Actonel [risedronate], Fosamax [alendronate sodium], Mevacor [lovastatin],  Miacalcin [calcitonin (salmon)], and Mobic [meloxicam]    Review of Systems   Review of Systems  Constitutional:  Negative for fever.  Respiratory:  Positive for shortness of breath. Negative for cough.   Cardiovascular:  Positive for leg swelling.  Gastrointestinal:  Positive for abdominal pain. Negative for diarrhea, nausea and vomiting.  Genitourinary:  Negative for dysuria.  Skin:  Negative for rash.  Neurological:  Negative for headaches.    Physical Exam Updated Vital Signs BP 132/65 (BP Location: Left Arm)   Pulse 94   Temp 97.9 F (36.6 C) (Oral)   Resp 16   Ht '5\' 3"'$  (1.6 m)   Wt 77 kg   SpO2 94%   BMI 30.08 kg/m  Physical Exam Vitals and nursing note reviewed.  Constitutional:      General: She is not in acute distress.    Appearance: Normal appearance. She is well-developed.  HENT:     Head: Normocephalic and atraumatic.  Eyes:     Conjunctiva/sclera: Conjunctivae normal.  Cardiovascular:     Rate and Rhythm: Normal rate and regular rhythm.     Heart sounds: Murmur heard.  Pulmonary:     Effort: Pulmonary effort is normal. No respiratory distress.     Breath sounds: Normal breath sounds.  Abdominal:     Palpations: Abdomen is soft.     Tenderness: There is no abdominal tenderness. There is no guarding or rebound.  Musculoskeletal:        General: No swelling.     Cervical back: Neck supple.     Right lower leg: Edema present.     Left lower leg: Edema present.     Comments: She has 1+ edema at her ankles.  Calves are supple with no cords.  Distal pulses intact.  Skin:    General: Skin is warm and dry.     Capillary Refill: Capillary refill takes less than 2 seconds.  Neurological:     General: No focal deficit present.     Mental Status: She is alert.     ED Results / Procedures / Treatments   Labs (all labs ordered are listed, but only abnormal results are displayed) Labs Reviewed  COMPREHENSIVE METABOLIC PANEL  LIPASE, BLOOD  CBC WITH  DIFFERENTIAL/PLATELET  BRAIN NATRIURETIC PEPTIDE  URINALYSIS, ROUTINE W REFLEX MICROSCOPIC  TROPONIN I (HIGH SENSITIVITY)    EKG None  Radiology No results found.  Procedures Procedures  {Document cardiac monitor, telemetry assessment procedure when appropriate:1}  Medications Ordered in ED Medications - No data to display  ED Course/ Medical Decision Making/ A&P                           Medical Decision Making Amount and/or Complexity of Data Reviewed Labs: ordered. Radiology: ordered.  This patient complains of ***; this involves an extensive number of  treatment Options and is a complaint that carries with it a high risk of complications and morbidity. The differential includes ***  I ordered, reviewed and interpreted labs, which included *** I ordered medication *** and reviewed PMP when indicated. I ordered imaging studies which included *** and I independently    visualized and interpreted imaging which showed *** Additional history obtained from *** Previous records obtained and reviewed *** I consulted *** and discussed lab and imaging findings and discussed disposition.  Cardiac monitoring reviewed, *** Social determinants considered, *** Critical Interventions: ***  After the interventions stated above, I reevaluated the patient and found *** Admission and further testing considered, ***    {Document critical care time when appropriate:1} {Document review of labs and clinical decision tools ie heart score, Chads2Vasc2 etc:1}  {Document your independent review of radiology images, and any outside records:1} {Document your discussion with family members, caretakers, and with consultants:1} {Document social determinants of health affecting pt's care:1} {Document your decision making why or why not admission, treatments were needed:1} Final Clinical Impression(s) / ED Diagnoses Final diagnoses:  None    Rx / DC Orders ED Discharge Orders     None

## 2022-02-10 ENCOUNTER — Ambulatory Visit (INDEPENDENT_AMBULATORY_CARE_PROVIDER_SITE_OTHER): Payer: Medicare HMO | Admitting: Family Medicine

## 2022-02-10 ENCOUNTER — Encounter: Payer: Self-pay | Admitting: Family Medicine

## 2022-02-10 VITALS — BP 97/57 | HR 67 | Temp 98.2°F | Ht 63.0 in | Wt 167.0 lb

## 2022-02-10 DIAGNOSIS — R609 Edema, unspecified: Secondary | ICD-10-CM | POA: Diagnosis not present

## 2022-02-10 DIAGNOSIS — R739 Hyperglycemia, unspecified: Secondary | ICD-10-CM | POA: Diagnosis not present

## 2022-02-10 MED ORDER — FUROSEMIDE 20 MG PO TABS: 10.0000 mg | ORAL_TABLET | Freq: Every day | ORAL | 1 refills | Status: DC

## 2022-02-10 MED ORDER — FERROUS SULFATE 325 (65 FE) MG PO TABS
325.0000 mg | ORAL_TABLET | Freq: Every day | ORAL | 3 refills | Status: DC
Start: 2022-02-10 — End: 2022-12-08

## 2022-02-10 MED ORDER — METOPROLOL TARTRATE 25 MG PO TABS
25.0000 mg | ORAL_TABLET | Freq: Two times a day (BID) | ORAL | 1 refills | Status: DC
Start: 2022-02-10 — End: 2022-03-13

## 2022-02-10 MED ORDER — DICLOFENAC SODIUM 1 % EX GEL: 2.0000 g | Freq: Four times a day (QID) | CUTANEOUS | 3 refills | Status: DC

## 2022-02-10 NOTE — Progress Notes (Signed)
BP (!) 97/57   Pulse 67   Temp 98.2 F (36.8 C)   Ht _0  (1.6 m)   Wt 167 lb (75.8 kg)   SpO2 97%   BMI 29.58 kg/m    Subjective:   Patient ID: Krystal [name], female    DOB: 1940/12/17, 81 y.o.   MRN: [id number]  HPI: Krystal [name] is a 81 y.o. female presenting on 02/10/2022 for Follow-up (Fluid retention)   HPI Peripheral edema Patient was in the emergency department for peripheral edema on 01/31/2022.  She was given furosemide and thinks it is quite strong because her blood pressure is down and she has lost some weight because of the fluid coming off.  She has come down 2 pounds and feels like it is going well.  She would like to cut back on the dose of the furosemide a little bit.  Her breathing is going good and denies any fluid overload.  Relevant past medical, surgical, family and social history reviewed and updated as indicated. Interim medical history since our last visit reviewed. Allergies and medications reviewed and updated.  Review of Systems  Constitutional:  Negative for chills and fever.  Eyes:  Negative for visual disturbance.  Respiratory:  Negative for chest tightness and shortness of breath.   Cardiovascular:  Positive for leg swelling. Negative for chest pain and palpitations.  Genitourinary:  Positive for frequency.  Musculoskeletal:  Negative for back pain and gait problem.  Skin:  Negative for rash.  Neurological:  Negative for dizziness, weakness, light-headedness, numbness and headaches.  Psychiatric/Behavioral:  Negative for agitation and behavioral problems.   All other systems reviewed and are negative.   Per HPI unless specifically indicated above   Allergies as of 02/10/2022       Reactions   Actonel [risedronate] Nausea And Vomiting, Other (See Comments)   Throat tightness   Fosamax [alendronate Sodium] Other (See Comments)   esophagitis   Mevacor [lovastatin] Other (See Comments)   myalgias   Miacalcin [calcitonin  (salmon)] Nausea And Vomiting   Mobic [meloxicam] Hives, Itching   rash        Medication List        Accurate as of February 10, 2022  4:39 PM. If you have any questions, ask your nurse or doctor.          albuterol 108 (90 Base) MCG/ACT inhaler Commonly known as: VENTOLIN HFA INHALE TWO PUFFS BY MOUTH 4 TIMES DAILY AS NEEDED   apixaban 5 MG Tabs tablet Commonly known as: ELIQUIS Take 1 tablet (5 mg total) by mouth 2 (two) times daily.   Calcium Citrate-Vitamin D 315-250 MG-UNIT Tabs Commonly known as: Calcium Citrate + D Take 2 tablets by mouth daily.   cetirizine 10 MG tablet Commonly known as: ZYRTEC Take 5-10 mg by mouth at bedtime.   diclofenac Sodium 1 % Gel Commonly known as: Voltaren Apply 2 g topically 4 (four) times daily.   diltiazem 300 MG 24 hr capsule Commonly known as: CARDIZEM CD TAKE 1 CAPSULE BY MOUTH EVERY DAY   esomeprazole 40 MG capsule Commonly known as: NEXIUM TAKE 1 CAPSULE BY MOUTH EVERY DAY   famotidine 20 MG tablet Commonly known as: Pepcid One after supper   ferrous sulfate 325 (65 FE) MG tablet Take 1 tablet (325 mg total) by mouth at bedtime.   furosemide 20 MG tablet Commonly known as: LASIX Take 0.5 tablets (10 mg total) by mouth daily. What changed:  how much to  take when to take this additional instructions Changed by: Fransisca Kaufmann Jens Siems, MD   ipratropium-albuterol 0.5-2.5 (3) MG/3ML Soln Commonly known as: DUONEB TAKE 3 MLS BY NEBULIZATION EVERY 6 (SIX) HOURS AS NEEDED. DX: ASTHMA 493.90   metoprolol tartrate 25 MG tablet Commonly known as: LOPRESSOR Take 1 tablet (25 mg total) by mouth 2 (two) times daily. What changed:  how much to take how to take this when to take this additional instructions Changed by: Fransisca Kaufmann Adil Tugwell, MD   montelukast 10 MG tablet Commonly known as: SINGULAIR TAKE 1 TABLET BY MOUTH AT BEDTIME   nitroGLYCERIN 0.4 MG/SPRAY spray Commonly known as: NITROLINGUAL ONE SPRAY UNDER  TONGUE AS NEED FOR CHESTPAIN. MAY REPEAT IN 5 MINUTES IF PAIN NOT RELIEVED CALL 911.   potassium chloride SA 20 MEQ tablet Commonly known as: KLOR-CON M Take 1 tablet (20 mEq total) by mouth daily for 3 days. Then as directed by your doctor   PRESERVISION/LUTEIN PO Take 1 capsule by mouth at bedtime.   Prolia 60 MG/ML Sosy injection Generic drug: denosumab TO BE ADMINISTERED IN PHYSICIAN'S OFFICE. INJECT ONE SYRINGE SUBCOUSLY ONCE EVERY 6 MONTHS. REFRIGERATE. USE WITHIN 14 DAYS ONCE AT ROOM TEMPERATURE.   rosuvastatin 20 MG tablet Commonly known as: CRESTOR TAKE 1 TABLET BY MOUTH EVERY DAY   Systane 0.4-0.3 % Soln Generic drug: Polyethyl Glycol-Propyl Glycol Place 1 drop into both eyes 2 (two) times daily.   Vitamin D 50 MCG (2000 UT) Caps Take 1 capsule by mouth at bedtime.         Objective:   BP (!) 97/57   Pulse 67   Temp 98.2 F (36.8 C)   Ht _0  (1.6 m)   Wt 167 lb (75.8 kg)   SpO2 97%   BMI 29.58 kg/m   Wt Readings from Last 3 Encounters:  02/10/22 167 lb (75.8 kg)  01/31/22 169 lb 12.8 oz (77 kg)  11/20/21 162 lb 9.6 oz (73.8 kg)    Physical Exam Vitals and nursing note reviewed.  Constitutional:      General: She is not in acute distress.    Appearance: She is well-developed. She is not diaphoretic.  Eyes:     Conjunctiva/sclera: Conjunctivae normal.     Pupils: Pupils are equal, round, and reactive to light.  Cardiovascular:     Rate and Rhythm: Normal rate and regular rhythm.     Heart sounds: Murmur (systolic 2/6 left 2nd intercostal) heard.  Pulmonary:     Effort: Pulmonary effort is normal. No respiratory distress.     Breath sounds: Normal breath sounds. No wheezing.  Musculoskeletal:        General: No tenderness. Normal range of motion.  Skin:    General: Skin is warm and dry.     Findings: No rash.  Neurological:     Mental Status: She is alert and oriented to person, place, and time.     Coordination: Coordination normal.   Psychiatric:        Behavior: Behavior normal.       Assessment & Plan:   Problem List Items Addressed This Visit       Other   Hyperglycemia   Other Visit Diagnoses     Peripheral edema    -  Primary   Relevant Medications   diclofenac Sodium (VOLTAREN) 1 % GEL   ferrous sulfate 325 (65 FE) MG tablet   metoprolol tartrate (LOPRESSOR) 25 MG tablet   furosemide (LASIX) 20 MG tablet  Other Relevant Orders   CMP14+EGFR     Recheck kidney function now that she is on diuretic recheck potassium as well.  We will have her lower the diuretic dose by cutting in half and only take 10 mg furosemide daily or take it every other day.  Follow up plan: Return if symptoms worsen or fail to improve, for Patient already has an appointment next month, keep that appointment.  Counseling provided for all of the vaccine components Orders Placed This Encounter  Procedures   Spofford Devereaux Grayson, MD [facility] 02/10/2022, 4:39 PM

## 2022-02-11 LAB — CMP14+EGFR
ALT: 14 IU/L (ref 0–32)
AST: 23 IU/L (ref 0–40)
Albumin/Globulin Ratio: 2.1 (ref 1.2–2.2)
Albumin: 4.1 g/dL (ref 3.7–4.7)
Alkaline Phosphatase: 76 IU/L (ref 44–121)
BUN/Creatinine Ratio: 17 (ref 12–28)
BUN: 15 mg/dL (ref 8–27)
Bilirubin Total: 0.4 mg/dL (ref 0.0–1.2)
CO2: 27 mmol/L (ref 20–29)
Calcium: 9.3 mg/dL (ref 8.7–10.3)
Chloride: 103 mmol/L (ref 96–106)
Creatinine, Ser: 0.9 mg/dL (ref 0.57–1.00)
Globulin, Total: 2 g/dL (ref 1.5–4.5)
Glucose: 158 mg/dL — ABNORMAL HIGH (ref 70–99)
Potassium: 4 mmol/L (ref 3.5–5.2)
Sodium: 143 mmol/L (ref 134–144)
Total Protein: 6.1 g/dL (ref 6.0–8.5)
eGFR: 64 mL/min/{1.73_m2} (ref >59)

## 2022-02-12 ENCOUNTER — Telehealth: Payer: Self-pay

## 2022-02-12 MED ORDER — POTASSIUM CHLORIDE CRYS ER 20 MEQ PO TBCR: 20.0000 meq | EXTENDED_RELEASE_TABLET | Freq: Every day | ORAL | 0 refills | Status: DC

## 2022-02-12 NOTE — Telephone Encounter (Signed)
Yes that is fine, go ahead and send in enough potassium for her to get her through to her next appointment.,  Can do in 90-day prescription

## 2022-02-12 NOTE — Telephone Encounter (Signed)
Potassium 54mq qd #60 [facility] [place] made aware.

## 2022-02-12 NOTE — Telephone Encounter (Signed)
Patient would like a refill of Potassium [facility] to [facility].  Please advise.

## 2022-02-21 ENCOUNTER — Ambulatory Visit (INDEPENDENT_AMBULATORY_CARE_PROVIDER_SITE_OTHER): Payer: Medicare HMO | Admitting: Internal Medicine

## 2022-02-21 ENCOUNTER — Encounter: Payer: Self-pay | Admitting: Internal Medicine

## 2022-02-21 DIAGNOSIS — R058 Other specified cough: Secondary | ICD-10-CM | POA: Diagnosis not present

## 2022-02-21 DIAGNOSIS — R0609 Other forms of dyspnea: Secondary | ICD-10-CM | POA: Diagnosis not present

## 2022-02-21 MED ORDER — BREZTRI AEROSPHERE 160-9-4.8 MCG/ACT IN AERO: 2.0000 | INHALATION_SPRAY | Freq: Two times a day (BID) | RESPIRATORY_TRACT | 11 refills | Status: DC

## 2022-02-21 NOTE — Progress Notes (Signed)
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  started in Aug 2022 but much worse since Sept 2022 and no better p 1st frost which usually takes care of her "allergies"  Has been using albuterol up 3 daily x 10 years but can go months s it in winter  Neb works much better than hfa with poor hfa technique at 1st ov 10/14/2021    History of Present Illness  10/14/2021  Pulmonary/ 1st office eval/ Krystal [name] / [facility]  Chief Complaint  Patient presents with   Consult    SOB and chest tightness since 04/2021  Dyspnea:  across parking lot doe and then in [place] leans on cart stopping every few min assoc with chest tightness no necessarily with or proportionate to ex  Cough: none Sleep: flat bed 2 pillows  awakens sev nights a week with chest tightness  SABA use: no less since started breztri / much less p pred rx  Much better also p neb but never rechallenges Rec Stop breztri for now  Only use your albuterol as a rescue medication  Ok to try albuterol 15 min before an activity (on alternating days)  that you know would usually make you short of breath Nexium 40 mg   Take  30-60 min before first meal of the day and Pepcid (famotidine)  20 mg after supper until return to office  GERD diet reviewed, bed blocks rec    Please remember to go [facility] the  [facility] at [facility]   To get the most out of exercise, you need to be continuously aware that you are short of breath, but never out of breath, for at least 30 minutes daily. Make sure you check your oxygen saturations at highest level of activity       Please schedule a follow up office visit in 6 weeks with pfts > nl    11/20/2021  f/u ov/[place] office/Taneka Espiritu re: doe maint on breztri ? Really helping Chief Complaint   Patient presents with   Follow-up    SOB and chest tightness are same since last ov.  PFT done on 11/13/2021   Dyspnea:  leans on cart at [place] does one aisle  Cough: mostly just pnds / esp in evening no better with zyrtec/ singulair  Sleeping: flat bed 2 pillows  SABA use: tid hfa/ neb after supper  02: none  Covid status: "had them all " Rec Try off breztri to see what difference it makes with your symptoms or needed albuterol and if change for the worse, restart Breztri   Continue singulair and For drainage / throat tickle try take CHLORPHENIRAMINE  4 mg        02/21/2022  f/u ov/[place] office/Virgene Tirone re: uacs  maint on singulair, gerd rx/ h1 hs   Chief Complaint  Patient presents with   Follow-up    Breathing and cough have improved    Dyspnea:  [employer] better leaning on cart p albuterol prior  Cough: none Sleeping: better p h1 and h2 SABA use: once or twice a day plus neb "only half a vial" also twice daily    02: none    No obvious day to day or daytime variability or assoc  excess/ purulent sputum or mucus plugs or hemoptysis or cp or chest tightness, subjective wheeze or overt sinus or hb symptoms.   Sleeping  without nocturnal  or early am exacerbation  of respiratory  c/o's or need for noct saba. Also denies any obvious fluctuation of symptoms with weather or environmental changes or other aggravating or alleviating factors except as outlined above   No unusual exposure hx or h/o childhood pna/ asthma or knowledge of premature birth.  Current Allergies, Complete Past Medical History, Past Surgical History, Family History, and Social History were reviewed in [employer] record.  ROS  The following are not active complaints unless bolded Hoarseness, sore throat, dysphagia, dental problems, itching, sneezing,  nasal congestion or discharge of excess mucus or purulent secretions, ear ache,   fever, chills, sweats, unintended wt loss or wt gain,  classically pleuritic or exertional cp,  orthopnea pnd or arm/hand swelling  or leg swelling, presyncope, palpitations, abdominal pain, anorexia, nausea, vomiting, diarrhea  or change in bowel habits or change in bladder habits, change in stools or change in urine, dysuria, hematuria,  rash, arthralgias, visual complaints, headache, numbness, weakness or ataxia or problems with walking or coordination,  change in mood or  memory.        Current Meds  Medication Sig   albuterol (VENTOLIN HFA) 108 (90 Base) MCG/ACT inhaler INHALE TWO PUFFS BY MOUTH 4 TIMES DAILY AS NEEDED   apixaban (ELIQUIS) 5 MG TABS tablet Take 1 tablet (5 mg total) by mouth 2 (two) times daily.   Calcium Citrate-Vitamin D (CALCIUM CITRATE + D) 315-250 MG-UNIT TABS Take 2 tablets by mouth daily.   cetirizine (ZYRTEC) 10 MG tablet Take 5-10 mg by mouth at bedtime.    Cholecalciferol (VITAMIN D) 2000 UNITS CAPS Take 1 capsule by mouth at bedtime.   denosumab (PROLIA) 60 MG/ML SOSY injection TO BE ADMINISTERED IN PHYSICIAN'S OFFICE. INJECT ONE SYRINGE SUBCOUSLY ONCE EVERY 6 MONTHS. REFRIGERATE. USE WITHIN 14 DAYS ONCE AT ROOM TEMPERATURE.   diclofenac Sodium (VOLTAREN) 1 % GEL Apply 2 g topically 4 (four) times daily.   diltiazem (CARDIZEM CD) 300 MG 24 hr capsule TAKE 1 CAPSULE BY MOUTH EVERY DAY   esomeprazole (NEXIUM) 40 MG capsule TAKE 1 CAPSULE BY MOUTH EVERY DAY   famotidine (PEPCID) 20 MG tablet One after supper   ferrous sulfate 325 (65 FE) MG tablet Take 1 tablet (325 mg total) by mouth at bedtime.   furosemide (LASIX) 20 MG tablet Take 0.5 tablets (10 mg total) by mouth daily.   ipratropium-albuterol (DUONEB) 0.5-2.5 (3) MG/3ML SOLN TAKE 3 MLS BY NEBULIZATION EVERY 6 (SIX) HOURS AS NEEDED. DX: ASTHMA 493.90   metoprolol tartrate (LOPRESSOR) 25 MG tablet Take 1 tablet (25 mg total) by mouth 2 (two) times daily.   montelukast (SINGULAIR) 10 MG tablet TAKE 1 TABLET BY MOUTH AT BEDTIME   Multiple Vitamins-Minerals  (PRESERVISION/LUTEIN PO) Take 1 capsule by mouth at bedtime.    nitroGLYCERIN (NITROLINGUAL) 0.4 MG/SPRAY spray ONE SPRAY UNDER TONGUE AS NEED FOR CHESTPAIN. MAY REPEAT IN 5 MINUTES IF PAIN NOT RELIEVED CALL 911.   Polyethyl Glycol-Propyl Glycol (SYSTANE) 0.4-0.3 % SOLN Place 1 drop into both eyes 2 (two) times daily.   potassium chloride SA (KLOR-CON M) 20 MEQ tablet Take 1 tablet (20 mEq total) by mouth daily. Then as directed by your doctor   rosuvastatin (CRESTOR) 20 MG tablet TAKE 1 TABLET BY MOUTH EVERY DAY  Past Medical History:  Diagnosis Date   Anemia    Asthma    Brain tumor ([place])    1986   Breast cancer ([place])    Right breast   Cataract    Colon polyps    COPD (chronic obstructive pulmonary disease) ([facility])    Hiatal hernia    Hyperlipidemia    Leg edema    Macular degeneration    Osteoporosis    Pneumonia    PVD (posterior vitreous detachment)    Right knee pain    Shingles rash       Objective:        02/21/2022      168   11/20/21 162 lb 9.6 oz (73.8 kg)  10/14/21 169 lb (76.7 kg)  09/04/21 167 lb (75.8 kg)    Vital signs reviewed  02/21/2022  - Note at rest 02 sats  96% on RA   General appearance:  frail   elderly wf very  slow walk/ 2 wheeled walker      HEENT : Oropharynx  clear   Nasal turbinates nl    NECK :  without  apparent JVD/ palpable Nodes/TM    LUNGS: no acc muscle use,  Mod kyphotic  contour chest wall with bilateral  slightly decreased bs s audible wheeze and  without cough on insp or exp maneuvers and min  Hyperresonant  to  percussion bilaterally    CV:  RRR  no s3 or murmur or increase in P2, and no edema   ABD:  obese soft and nontender with pos end  insp Hoover's  in the supine position.  No bruits or organomegaly appreciated   MS:  Nl gait/ ext warm without deformities Or obvious joint restrictions  calf tenderness, cyanosis or clubbing     SKIN: warm and dry without lesions    NEURO:  alert,  approp, nl sensorium with  no motor or cerebellar deficits apparent.          I personally reviewed images and agree with radiology impression as follows:  CXR:   pa and lateral 01/31/22 No active disease.  Large hiatal hernia   Assessment

## 2022-02-21 NOTE — Assessment & Plan Note (Signed)
Trial of 1st gen H1  Plus singulair and stop zyrtec  11/20/2021 / large HH on cxr   >>> resolved as of 02/21/2022 on singulair/ H2 blockers and 1st gen H1 blockers per guidelines    No change rx needed   Return in 4 weeks with all meds in hand using a trust but verify approach to confirm accurate Medication  Reconciliation The principal here is that until we are certain that the  patients are doing what we've asked, it makes no sense to ask them to do more.           Each maintenance medication was reviewed in detail including emphasizing most importantly the difference between maintenance and prns and under what circumstances the prns are to be triggered using an action plan format where appropriate.  Total time for H and P, chart review, counseling, reviewing hfa/n3b device(s) and generating customized  > 30 min for multiple  refractory respiratory  symptoms of uncertain etiology

## 2022-02-21 NOTE — Assessment & Plan Note (Addendum)
Onset Aug 2022 - Echo 09/19/21  Mild MR, nl LA - 10/14/2021   Walked on RA x 2   lap(s) =  approx 300  ft  @ slow  pace, stopped due to sob  with lowest 02 sats 96% - 10/14/2021 max rx for GERD -  Allergy screen 10/14/2021 >  Eos 0.3 /  IgE 30 - PFT's  11/13/21  FEV1 1.36 (76 % ) ratio 0.72  p 9 % improvement from saba p 0 prior to study with DLCO  9.43 (52%)   and FV curve min concave and ERV 55% at wt 164   - 11/20/2021   Walked on RA  x  one  lap(s) =  approx 150  ft  @ slow pace, stopped due to sob with lowest 02 sats 95%    - 02/21/2022  After extensive coaching inhaler device,  effectiveness =    50% with mdi but insists she does better p saba so rec :  Challenge with breztri up to 2 bid sample and fill if has same benefit as with saba  - suggesting use of empty inhaler to train on optimal hfa  She has very little in terms of airflow obst and she should get better response to breztri than to saba by hfa if in fact there is any occult copd or asthma here.   F/u can be in 4 weeks, sooner if needed

## 2022-02-21 NOTE — Patient Instructions (Addendum)
Plan A = Automatic = Always=    restart Breztri Take 2 puffs first thing in am and then another 2 puffs about 12 hours later.    Work on inhaler technique:  relax and gently blow all the way out then take a nice smooth full deep breath back in, triggering the inhaler at same time you start breathing in.  Hold for up to 5 seconds if you can. Blow Breztri out thru nose. Rinse and gargle with water when done.  If mouth or throat bother you at all,  try brushing teeth/gums/tongue with arm and hammer toothpaste/ make a slurry and gargle and spit out.      Plan B = Backup (to supplement plan A, not to replace it) Only use your albuterol inhaler as a rescue medication to be used if you can't catch your breath by resting or doing a relaxed purse lip breathing pattern.  - The less you use it, the better it will work when you need it. - Ok to use the inhaler up to 2 puffs  every 4 hours if you must but call for appointment if use goes up over your usual need - Don't leave home without it !!  (think of it like the spare tire for your car)   Plan C = Crisis (instead of Plan B but only if Plan B stops working) - only use your albuterol nebulizer if you first try Plan B and it fails to help > ok to use the nebulizer up to every 4 hours but if start needing it regularly call for immediate appointment   Be sure to take your pepcid and chlopheniramine 1-2  about an hour before bed which should 6-8 blocks or riser   Please schedule a follow up office visit in 4 weeks, sooner if needed  with all medications /inhalers/ solutions in hand so we can verify exactly what you are taking. This includes all medications from all doctors and over the counters

## 2022-02-24 DIAGNOSIS — H353124 Nonexudative age-related macular degeneration, left eye, advanced atrophic with subfoveal involvement: Secondary | ICD-10-CM | POA: Diagnosis not present

## 2022-02-24 DIAGNOSIS — H353211 Exudative age-related macular degeneration, right eye, with active choroidal neovascularization: Secondary | ICD-10-CM | POA: Diagnosis not present

## 2022-03-04 ENCOUNTER — Other Ambulatory Visit: Payer: Self-pay | Admitting: Cardiology

## 2022-03-04 ENCOUNTER — Other Ambulatory Visit: Payer: Self-pay | Admitting: Medical

## 2022-03-04 DIAGNOSIS — I480 Paroxysmal atrial fibrillation: Secondary | ICD-10-CM

## 2022-03-05 ENCOUNTER — Ambulatory Visit (INDEPENDENT_AMBULATORY_CARE_PROVIDER_SITE_OTHER): Payer: Medicare HMO | Admitting: Family Medicine

## 2022-03-05 ENCOUNTER — Encounter: Payer: Self-pay | Admitting: Family Medicine

## 2022-03-05 VITALS — BP 119/62 | HR 64 | Temp 97.9°F | Ht 63.0 in | Wt 167.0 lb

## 2022-03-05 DIAGNOSIS — J4520 Mild intermittent asthma, uncomplicated: Secondary | ICD-10-CM

## 2022-03-05 DIAGNOSIS — K219 Gastro-esophageal reflux disease without esophagitis: Secondary | ICD-10-CM | POA: Diagnosis not present

## 2022-03-05 DIAGNOSIS — E782 Mixed hyperlipidemia: Secondary | ICD-10-CM

## 2022-03-05 DIAGNOSIS — F411 Generalized anxiety disorder: Secondary | ICD-10-CM

## 2022-03-05 DIAGNOSIS — M7631 Iliotibial band syndrome, right leg: Secondary | ICD-10-CM

## 2022-03-05 DIAGNOSIS — F339 Major depressive disorder, recurrent, unspecified: Secondary | ICD-10-CM

## 2022-03-05 DIAGNOSIS — R69 Illness, unspecified: Secondary | ICD-10-CM | POA: Diagnosis not present

## 2022-03-05 MED ORDER — IPRATROPIUM-ALBUTEROL 0.5-2.5 (3) MG/3ML IN SOLN: 3.0000 mL | Freq: Four times a day (QID) | RESPIRATORY_TRACT | 1 refills | Status: DC | PRN

## 2022-03-05 NOTE — Telephone Encounter (Signed)
Prescription refill request for Eliquis received. Indication: Afib  Last office visit: 08/21/21 ([place])  Scr: 0.90 (02/10/22)  Age: 81 Weight: 76.2kg  Appropriate dose and refill sent to requested pharmacy.

## 2022-03-05 NOTE — Progress Notes (Signed)
BP 119/62   Pulse 64   Temp 97.9 F (36.6 C)   Ht 5' 3"  (1.6 m)   Wt 167 lb (75.8 kg)   SpO2 96%   BMI 29.58 kg/m    Subjective:   Patient ID: Krystal [name], female    DOB: [birth year], 81 y.o.   MRN: [id number]  HPI: Krystal [name] is a 81 y.o. female presenting on 03/05/2022 for Medical Management of Chronic Issues, Hyperlipidemia, and Hip Pain (Radiates down to knee. Feels like leg is giving away)   HPI Hyperlipidemia Patient is coming in for recheck of his hyperlipidemia. The patient is currently taking Crestor. They deny any issues with myalgias or history of liver damage from it. They deny any focal numbness or weakness or chest pain.   GERD Patient is currently on famotidine and Nexium.  She denies any major symptoms or abdominal pain or belching or burping. She denies any blood in her stool or lightheadedness or dizziness.   Asthma recheck Patient is coming in for asthma recheck.  She does have Breztri but then also uses albuterol as needed and just needs a refill on that today.  She feels like her breathing is doing very well.    Anxiety depression recheck. Patient is coming in today for anxiety and depression recheck. She is not on anything for currently, she stopped medicine when she went to A-fib in the emergency department and has been doing well.  Relevant past medical, surgical, family and social history reviewed and updated as indicated. Interim medical history since our last visit reviewed. Allergies and medications reviewed and updated.  Review of Systems  Constitutional:  Negative for chills and fever.  Eyes:  Negative for visual disturbance.  Respiratory:  Negative for chest tightness and shortness of breath.   Cardiovascular:  Negative for chest pain and leg swelling.  Musculoskeletal:  Positive for arthralgias and myalgias. Negative for back pain and gait problem.  Skin:  Negative for rash.  Neurological:  Negative for dizziness, weakness,  light-headedness and headaches.  Psychiatric/Behavioral:  Negative for agitation, behavioral problems, dysphoric mood, self-injury, sleep disturbance and suicidal ideas. The patient is not nervous/anxious.   All other systems reviewed and are negative.   Per HPI unless specifically indicated above   Allergies as of 03/05/2022       Reactions   Actonel [risedronate] Nausea And Vomiting, Other (See Comments)   Throat tightness   Fosamax [alendronate Sodium] Other (See Comments)   esophagitis   Mevacor [lovastatin] Other (See Comments)   myalgias   Miacalcin [calcitonin (salmon)] Nausea And Vomiting   Mobic [meloxicam] Hives, Itching   rash        Medication List        Accurate as of March 05, 2022  1:27 PM. If you have any questions, ask your nurse or doctor.          STOP taking these medications    Prolia 60 MG/ML Sosy injection Generic drug: denosumab Stopped by: Fransisca Kaufmann Thimothy Barretta, MD       TAKE these medications    albuterol 108 (90 Base) MCG/ACT inhaler Commonly known as: VENTOLIN HFA INHALE TWO PUFFS BY MOUTH 4 TIMES DAILY AS NEEDED   Breztri Aerosphere 160-9-4.8 MCG/ACT Aero Generic drug: Budeson-Glycopyrrol-Formoterol Inhale 2 puffs into the lungs 2 (two) times daily.   Calcium Citrate-Vitamin D 315-250 MG-UNIT Tabs Commonly known as: Calcium Citrate + D Take 2 tablets by mouth daily.   cetirizine 10 MG tablet Commonly  known as: ZYRTEC Take 5-10 mg by mouth at bedtime.   diclofenac Sodium 1 % Gel Commonly known as: Voltaren Apply 2 g topically 4 (four) times daily.   diltiazem 300 MG 24 hr capsule Commonly known as: CARDIZEM CD TAKE 1 CAPSULE BY MOUTH EVERY DAY   Eliquis 5 MG Tabs tablet Generic drug: apixaban TAKE 1 TABLET BY MOUTH TWICE A DAY   esomeprazole 40 MG capsule Commonly known as: NEXIUM TAKE 1 CAPSULE BY MOUTH EVERY DAY   famotidine 20 MG tablet Commonly known as: Pepcid One after supper   ferrous sulfate 325 (65 FE)  MG tablet Take 1 tablet (325 mg total) by mouth at bedtime.   furosemide 20 MG tablet Commonly known as: LASIX Take 0.5 tablets (10 mg total) by mouth daily.   ipratropium-albuterol 0.5-2.5 (3) MG/3ML Soln Commonly known as: DUONEB Take 3 mLs by nebulization every 6 (six) hours as needed. Dx:  Asthma 493.90   metoprolol tartrate 25 MG tablet Commonly known as: LOPRESSOR Take 1 tablet (25 mg total) by mouth 2 (two) times daily.   montelukast 10 MG tablet Commonly known as: SINGULAIR TAKE 1 TABLET BY MOUTH AT BEDTIME   nitroGLYCERIN 0.4 MG/SPRAY spray Commonly known as: NITROLINGUAL ONE SPRAY UNDER TONGUE AS NEED FOR CHESTPAIN. MAY REPEAT IN 5 MINUTES IF PAIN NOT RELIEVED CALL 911.   potassium chloride SA 20 MEQ tablet Commonly known as: KLOR-CON M Take 1 tablet (20 mEq total) by mouth daily. Then as directed by your doctor   PRESERVISION/LUTEIN PO Take 1 capsule by mouth at bedtime.   rosuvastatin 20 MG tablet Commonly known as: CRESTOR TAKE 1 TABLET BY MOUTH EVERY DAY   Systane 0.4-0.3 % Soln Generic drug: Polyethyl Glycol-Propyl Glycol Place 1 drop into both eyes 2 (two) times daily.   Vitamin D 50 MCG (2000 UT) Caps Take 1 capsule by mouth at bedtime.         Objective:   BP 119/62   Pulse 64   Temp 97.9 F (36.6 C)   Ht 5' 3"  (1.6 m)   Wt 167 lb (75.8 kg)   SpO2 96%   BMI 29.58 kg/m   Wt Readings from Last 3 Encounters:  03/05/22 167 lb (75.8 kg)  02/21/22 168 lb (76.2 kg)  02/10/22 167 lb (75.8 kg)    Physical Exam Vitals and nursing note reviewed.  Constitutional:      General: She is not in acute distress.    Appearance: She is well-developed. She is not diaphoretic.  Eyes:     Conjunctiva/sclera: Conjunctivae normal.  Cardiovascular:     Rate and Rhythm: Normal rate and regular rhythm.     Heart sounds: Normal heart sounds. No murmur heard. Pulmonary:     Effort: Pulmonary effort is normal. No respiratory distress.     Breath sounds:  Normal breath sounds. No wheezing.  Musculoskeletal:        General: Normal range of motion.     Right hip: Tenderness present.       Legs:  Skin:    General: Skin is warm and dry.     Findings: No rash.  Neurological:     Mental Status: She is alert and oriented to person, place, and time.     Coordination: Coordination normal.  Psychiatric:        Behavior: Behavior normal.       Assessment & Plan:   Problem List Items Addressed This Visit       Respiratory  Asthma - Primary   Relevant Medications   ipratropium-albuterol (DUONEB) 0.5-2.5 (3) MG/3ML SOLN   Other Relevant Orders   CBC with Differential/Platelet   CMP14+EGFR   Lipid panel     Digestive   GERD   Relevant Orders   CBC with Differential/Platelet   CMP14+EGFR   Lipid panel     Other   Mixed hyperlipidemia   Relevant Orders   CBC with Differential/Platelet   CMP14+EGFR   Lipid panel   Anxiety state   Relevant Orders   CBC with Differential/Platelet   CMP14+EGFR   Lipid panel   Depression, recurrent (HCC)    Recommended stretches and topical anti-inflammatories such as Voltaren gel to help with IT band syndrome, may do physical therapy in the future if does not improve Follow up plan: Return in about 6 months (around 09/05/2022), or if symptoms worsen or fail to improve, for Asthma A-fib and hyperlipidemia recheck.  Counseling provided for all of the vaccine components Orders Placed This Encounter  Procedures   CBC with Differential/Platelet   CMP14+EGFR   Lipid panel    Caryl Pina, MD [facility] 03/05/2022, 1:27 PM

## 2022-03-06 LAB — CBC WITH DIFFERENTIAL/PLATELET
Basophils Absolute: 0 10*3/uL (ref 0.0–0.2)
Basos: 0 %
EOS (ABSOLUTE): 0.2 10*3/uL (ref 0.0–0.4)
Eos: 2 %
Hematocrit: 39.5 % (ref 34.0–46.6)
Hemoglobin: 12.8 g/dL (ref 11.1–15.9)
Immature Grans (Abs): 0 10*3/uL (ref 0.0–0.1)
Immature Granulocytes: 0 %
Lymphocytes Absolute: 2.4 10*3/uL (ref 0.7–3.1)
Lymphs: 35 %
MCH: 30.3 pg (ref 26.6–33.0)
MCHC: 32.4 g/dL (ref 31.5–35.7)
MCV: 93 fL (ref 79–97)
Monocytes Absolute: 0.5 10*3/uL (ref 0.1–0.9)
Monocytes: 7 %
Neutrophils Absolute: 3.7 10*3/uL (ref 1.4–7.0)
Neutrophils: 56 %
Platelets: 180 10*3/uL (ref 150–450)
RBC: 4.23 x10E6/uL (ref 3.77–5.28)
RDW: 12.1 % (ref 11.7–15.4)
WBC: 6.7 10*3/uL (ref 3.4–10.8)

## 2022-03-06 LAB — LIPID PANEL
Chol/HDL Ratio: 2.1 ratio (ref 0.0–4.4)
Cholesterol, Total: 127 mg/dL (ref 100–199)
HDL: 61 mg/dL (ref >39)
LDL Chol Calc (NIH): 47 mg/dL (ref 0–99)
Triglycerides: 103 mg/dL (ref 0–149)
VLDL Cholesterol Cal: 19 mg/dL (ref 5–40)

## 2022-03-06 LAB — CMP14+EGFR
ALT: 14 IU/L (ref 0–32)
AST: 20 IU/L (ref 0–40)
Albumin/Globulin Ratio: 2.3 — ABNORMAL HIGH (ref 1.2–2.2)
Albumin: 4.2 g/dL (ref 3.7–4.7)
Alkaline Phosphatase: 75 IU/L (ref 44–121)
BUN/Creatinine Ratio: 17 (ref 12–28)
BUN: 13 mg/dL (ref 8–27)
Bilirubin Total: 0.4 mg/dL (ref 0.0–1.2)
CO2: 26 mmol/L (ref 20–29)
Calcium: 9.6 mg/dL (ref 8.7–10.3)
Chloride: 104 mmol/L (ref 96–106)
Creatinine, Ser: 0.77 mg/dL (ref 0.57–1.00)
Globulin, Total: 1.8 g/dL (ref 1.5–4.5)
Glucose: 93 mg/dL (ref 70–99)
Potassium: 4.5 mmol/L (ref 3.5–5.2)
Sodium: 143 mmol/L (ref 134–144)
Total Protein: 6 g/dL (ref 6.0–8.5)
eGFR: 77 mL/min/{1.73_m2} (ref >59)

## 2022-03-12 ENCOUNTER — Other Ambulatory Visit: Payer: Self-pay | Admitting: Family Medicine

## 2022-03-12 DIAGNOSIS — R609 Edema, unspecified: Secondary | ICD-10-CM

## 2022-03-13 NOTE — Telephone Encounter (Signed)
Klor-[facility] RF request Directions 1QD. Then as directed by your doctor Is pt on this daily, can 'then as directed by doctor' be removed if so, can RFs be added as well, if appropriate Please advise

## 2022-04-03 ENCOUNTER — Encounter: Payer: Self-pay | Admitting: Internal Medicine

## 2022-04-03 ENCOUNTER — Ambulatory Visit (INDEPENDENT_AMBULATORY_CARE_PROVIDER_SITE_OTHER): Payer: Medicare HMO | Admitting: Internal Medicine

## 2022-04-03 DIAGNOSIS — R0609 Other forms of dyspnea: Secondary | ICD-10-CM | POA: Diagnosis not present

## 2022-04-03 DIAGNOSIS — R058 Other specified cough: Secondary | ICD-10-CM

## 2022-04-03 NOTE — Patient Instructions (Addendum)
Pantoprazole (protonix) 40 mg   Take  30-60 min before first meal of the day and Pepcid (famotidine)  20 mg after supper (or an hour before bed)  until return to office - this is the best way to tell whether stomach acid is contributing to your problem   Bed blocks x  6-8 inches under head board to see if night time cough from hernia improves    For drainage / throat tickle try take CHLORPHENIRAMINE  4 mg  ("Allergy Relief" '4mg'$   at [facility] should be easiest to find in the blue box usually on bottom shelf)  take one every 4 hours as needed - extremely effective and inexpensive over the counter- may cause drowsiness so start with just a dose or two an hour before bedtime and see how you tolerate it before trying in daytime.    Ok to try albuterol 15 min before an activity (on alternating days)  that you know would usually make you short of breath and see if it makes any difference and if makes none then don't take albuterol after activity unless you can't catch your breath as this means it's the resting that helps, not the albuterol.  Make sure you check your oxygen saturation at your highest level of activity to be sure it stays over 90% and keep track of it at least once a week, more often if breathing getting worse, and let me know if losing ground.   Pulmonary follow up is as needed if your oxygen level is falling with exertion

## 2022-04-03 NOTE — Assessment & Plan Note (Addendum)
Trial of 1st gen H1  Plus singulair and stop zyrtec  11/20/2021   >>> resolved as of 02/21/2022 on singulair/ H2 blockers and 1st gen H1 blockers per guidelines    She has a large HH on cxr and I suspect many of her atypical resp symptoms are related to poor gerd rx so again rec  >>> Max acid suppression HS  H1 and H2  Bed blocks Regular sub max ex tracking sats   F/u pulmonary clinic prn.   Each maintenance medication was reviewed in detail including emphasizing most importantly the difference between maintenance and prns and under what circumstances the prns are to be triggered using an action plan format where appropriate.  Total time for H and P, chart review, counseling, reviewing hfa/neb  device(s) , directly observing portions of ambulatory 02 saturation study/ and generating customized AVS unique to this office visit / same day charting = > 30 min for multiple  refractory respiratory  symptoms of uncertain etiology / summary final regular office visit

## 2022-04-03 NOTE — Assessment & Plan Note (Addendum)
Onset Aug 2022 - Echo 09/19/21  Mild MR, nl LA - 10/14/2021   Walked on RA x 2   lap(s) =  approx 300  ft  @ slow  pace, stopped due to sob  with lowest 02 sats 96% - 10/14/2021 max rx for GERD -  Allergy screen 10/14/2021 >  Eos 0.3 /  IgE 30 - PFT's  11/13/21  FEV1 1.36 (76 % ) ratio 0.72  p 9 % improvement from saba p 0 prior to study with DLCO  9.43 (52%)   and FV curve min concave and ERV 55% at wt 164   - 11/20/2021   Walked on RA  x  one  lap(s) =  approx 150  ft  @ slow pace, stopped due to sob with lowest 02 sats 95%   - 02/21/2022  After extensive coaching inhaler device,  effectiveness =    50% with mdi but insists she does better p saba so rec :  Challenge with breztri up to 2 bid sample and fill if has same benefit as with saba> could not tol due to throat irritation, not sure it helped breathing at all so d/c'd  - 04/03/2022   Walked on RA  x  2  lap(s) =  approx 300  ft  @ slow/rollator pace, stopped due to tired with lowest 02 sats 95%    Very little evidence to support asthma here with pattern more of geriatric decline with deconditioning so fine with me to use saba as long as use does not go up over baseline and only using prior to exertion, not after.

## 2022-04-03 NOTE — Progress Notes (Signed)
Krystal [name], female    DOB: Jul 12, 1941,    MRN: [id number]   Brief patient profile:  81  yowf  minimal smoking hx  referred to pulmonary clinic in [place]  10/14/2021 by Dr Krystal [name] for unexplained doe with cards w/u only pos for Mild MR with nl LA  started in Aug 2022 but much worse since Sept 2022 and no better p 1st frost which usually takes care of her "allergies"  Has been using albuterol up 3 daily x 10 years but can go months s it in winter  Neb works much better than hfa with poor hfa technique at 1st ov 10/14/2021    History of Present Illness  10/14/2021  Pulmonary/ 1st [name] eval/ Krystal [name] / Krystal [name]  Chief Complaint  Patient presents with   Consult    SOB and chest tightness since 04/2021  Dyspnea:  across parking lot doe and then in [place] leans on cart stopping every few min assoc with chest tightness no necessarily with or proportionate to ex  Cough: none Sleep: flat bed 2 pillows  awakens sev nights a week with chest tightness  SABA use: no less since started breztri / much less p pred rx  Much better also p neb but never rechallenges Rec Stop breztri for now  Only use your albuterol as a rescue medication  Ok to try albuterol 15 min before an activity (on alternating days)  that you know would usually make you short of breath Nexium 40 mg   Take  30-60 min before first meal of the day and Pepcid (famotidine)  20 mg after supper until return to [name]  GERD diet reviewed, bed blocks rec    Please remember to go to the  [facility] department at [facility]   To get the most out of exercise, you need to be continuously aware that you are short of breath, but never out of breath, for at least 30 minutes daily. Make sure you check your oxygen saturations at highest level of activity      Please schedule a follow up Krystal [name] in 6 weeks with pfts > nl    11/20/2021  f/u ov/Krystal [name]/Krystal [name] re: doe maint on breztri ? Really helping Chief Complaint   Patient presents with   Follow-up    SOB and chest tightness are same since last ov.  PFT done on 11/13/2021   Dyspnea:  leans on cart at [place] does one aisle  Cough: mostly just pnds / esp in evening no better with zyrtec/ singulair  Sleeping: flat bed 2 pillows  SABA use: tid hfa/ neb after supper  02: none  Covid status: "had them all " Rec Try off breztri to see what difference it makes with your symptoms or needed albuterol and if change for the worse, restart Breztri   Continue singulair and For drainage / throat tickle try take CHLORPHENIRAMINE  4 mg        02/21/2022  f/u ov/[name]/Krystal [name] re: uacs  maint on singulair, gerd rx/ h1 hs   Chief Complaint  Patient presents with   Follow-up    Breathing and cough have improved    Dyspnea:  [employer] better leaning on cart p albuterol prior  Cough: none Sleeping: better p h1 and h2 SABA use: once or twice a day plus neb "only half a vial" also twice daily    02: none  Rec Plan A = Automatic = Always=    restart Breztri Take  2 puffs first thing in am and then another 2 puffs about 12 hours later.   Work on inhaler technique:   Plan B = Backup (to supplement plan A, not to replace it) Only use your albuterol inhaler as a rescue medication  Plan C = Crisis (instead of Plan B but only if Plan B stops working) - only use your albuterol nebulizer if you first try Plan B Be sure to take your pepcid and chlopheniramine 1-2  about an hour before bed which should 6-8 blocks or riser   Please schedule a follow up Krystal [name] in 4 weeks, sooner if needed  with all medications /inhalers/ solutions in hand     04/03/2022  f/u ov/[place] [name]/Krystal [name] re: ? asthma maint on no maint rx, convinced neb needed before activity but did not use today, not using ppi correcly/ large HH only abn on cxr  Chief Complaint  Patient presents with   Follow-up    Breathing is about the same since last ov   Dyspnea:  walking rollator / walking  around [employer]  Cough: dry since she can remember  Sleeping: noct cough is better but not gone, has not max noct h1 as rec  SABA use: up to 3 x weekly  02: none  Covid status: vax x 4     No obvious day to day or daytime variability or assoc excess/ purulent sputum or mucus plugs or hemoptysis or cp or chest tightness, subjective wheeze or overt sinus or hb symptoms.     Also denies any obvious fluctuation of symptoms with weather or environmental changes or other aggravating or alleviating factors except as outlined above   No unusual exposure hx or h/o childhood pna/ asthma or knowledge of premature birth.  Current Allergies, Complete Past Medical History, Past Surgical History, Family History, and Social History were reviewed in [employer] record.  ROS  The following are not active complaints unless bolded Hoarseness, sore throat, dysphagia= globus, worse on breztri, dental problems, itching, sneezing,  nasal congestion or discharge of excess mucus or purulent secretions, ear ache,   fever, chills, sweats, unintended wt loss or wt gain, classically pleuritic or exertional cp,  orthopnea pnd or arm/hand swelling  or leg swelling, presyncope, palpitations, abdominal pain, anorexia, nausea, vomiting, diarrhea  or change in bowel habits or change in bladder habits, change in stools or change in urine, dysuria, hematuria,  rash, arthralgias, visual complaints, headache, numbness, weakness or ataxia or problems with walking/uses rollator or coordination,  change in mood or  memory.        Current Meds  Medication Sig   albuterol (VENTOLIN HFA) 108 (90 Base) MCG/ACT inhaler INHALE TWO PUFFS BY MOUTH 4 TIMES DAILY AS NEEDED   Budeson-Glycopyrrol-Formoterol (BREZTRI AEROSPHERE) 160-9-4.8 MCG/ACT AERO Inhale 2 puffs into the lungs 2 (two) times daily.   Calcium Citrate-Vitamin D (CALCIUM CITRATE + D) 315-250 MG-UNIT TABS Take 2 tablets by mouth daily.   cetirizine (ZYRTEC)  10 MG tablet Take 5-10 mg by mouth at bedtime.    Cholecalciferol (VITAMIN D) 2000 UNITS CAPS Take 1 capsule by mouth at bedtime.   diclofenac Sodium (VOLTAREN) 1 % GEL Apply 2 g topically 4 (four) times daily.   diltiazem (CARDIZEM CD) 300 MG 24 hr capsule TAKE 1 CAPSULE BY MOUTH EVERY DAY   ELIQUIS 5 MG TABS tablet TAKE 1 TABLET BY MOUTH TWICE A DAY   esomeprazole (NEXIUM) 40 MG capsule TAKE 1 CAPSULE BY MOUTH EVERY DAY  famotidine (PEPCID) 20 MG tablet One after supper   ferrous sulfate 325 (65 FE) MG tablet Take 1 tablet (325 mg total) by mouth at bedtime.   furosemide (LASIX) 20 MG tablet Take 0.5 tablets (10 mg total) by mouth daily.   ipratropium-albuterol (DUONEB) 0.5-2.5 (3) MG/3ML SOLN Take 3 mLs by nebulization every 6 (six) hours as needed. Dx:  Asthma 493.90   metoprolol tartrate (LOPRESSOR) 25 MG tablet TAKE 1 TABLET BY MOUTH TWICE A DAY   montelukast (SINGULAIR) 10 MG tablet TAKE 1 TABLET BY MOUTH EVERYDAY AT BEDTIME   Multiple Vitamins-Minerals (PRESERVISION/LUTEIN PO) Take 1 capsule by mouth at bedtime.    nitroGLYCERIN (NITROLINGUAL) 0.4 MG/SPRAY spray ONE SPRAY UNDER TONGUE AS NEED FOR CHESTPAIN. MAY REPEAT IN 5 MINUTES IF PAIN NOT RELIEVED CALL 911.   Polyethyl Glycol-Propyl Glycol (SYSTANE) 0.4-0.3 % SOLN Place 1 drop into both eyes 2 (two) times daily.   potassium chloride SA (KLOR-CON M20) 20 MEQ tablet Take 1 tablet (20 mEq total) by mouth daily.   rosuvastatin (CRESTOR) 20 MG tablet TAKE 1 TABLET BY MOUTH EVERY DAY                     Past Medical History:  Diagnosis Date   Anemia    Asthma    Brain tumor ([place])    1986   Breast cancer ([place])    Right breast   Cataract    Colon polyps    COPD (chronic obstructive pulmonary disease) (HCC)    Hiatal hernia    Hyperlipidemia    Leg edema    Macular degeneration    Osteoporosis    Pneumonia    PVD (posterior vitreous detachment)    Right knee pain    Shingles rash       Objective:      04/03/2022       165    02/21/2022      168   11/20/21 162 lb 9.6 oz (73.8 kg)  10/14/21 169 lb (76.7 kg)  09/04/21 167 lb (75.8 kg)          Amb somber and stoic wf/ ambulates with rollator/ barely able to stand up from chair or climb onto exam table even with one person assist    HEENT : Oropharynx  clear   Nasal turbinates nl    NECK :  without  apparent JVD/ palpable Nodes/TM    LUNGS: no acc muscle use,  Min barrel / mod kyphotic contour chest wall with bilateral  slightly decreased bs s audible wheeze and  without cough on insp or exp maneuvers and min  Hyperresonant  to  percussion bilaterally    CV:  RRR  no s3 or murmur or increase in P2, and no edema   ABD:  soft and nontender with pos end  insp Hoover's  in the supine position.  No bruits or organomegaly appreciated   MS:  Nl gait/ ext warm without deformities Or obvious joint restrictions  calf tenderness, cyanosis or clubbing     SKIN: warm and dry without lesions    NEURO:  alert, approp, nl sensorium with  no motor or cerebellar deficits apparent.        I personally reviewed images and agree with radiology impression as follows:  CXR:   pa and lateral 01/31/22 No active disease.  Large hiatal hernia   Assessment

## 2022-05-09 DIAGNOSIS — C50911 Malignant neoplasm of unspecified site of right female breast: Secondary | ICD-10-CM | POA: Diagnosis not present

## 2022-05-13 ENCOUNTER — Encounter: Payer: Self-pay | Admitting: Cardiology

## 2022-05-13 DIAGNOSIS — I351 Nonrheumatic aortic (valve) insufficiency: Secondary | ICD-10-CM | POA: Insufficient documentation

## 2022-05-13 NOTE — Progress Notes (Unsigned)
Cardiology Office Note   Date:  05/14/2022   ID:  Krystal [name], DOB 09-Feb-1941, MRN [id number]  PCP:  Dettinger, Fransisca Kaufmann, MD  Cardiologist:   Minus Breeding, MD   Chief Complaint  Patient presents with   Atrial Fibrillation      History of Present Illness: Krystal [name] is a 81 y.o. female who presents for follow up of SVT.    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  I sent her for this because of ongoing chest pain.  She did have a large hiatal hernia.   She was last in the hospital in Sept 2022.  She had acute respiratory failure.  She was treated for COPD.  She had PAF.  She was in the emergency room in June.  This was for edema.  Her BNP was very minimally elevated.  There was nothing on her chest x-ray.  EKG was unremarkable.  I did review these records.  She was given 1 dose of 1 mg IV Lasix and sent home.  She had resolution of her symptoms.  She had no further chest pressure, neck or arm discomfort since then.  She gets occasional palpitations.  She gets anxious she might get palpitations.  She says she then walks around [place] and she gets better.  She thinks stress some emotion like the recent passing of her brother might bring on the symptoms but typically she feels well.  She gets around slowly with a walker.   Past Medical History:  Diagnosis Date   Anemia    Asthma    Brain tumor ([place])    1986   Breast cancer ([place])    Right breast   Cataract    Colon polyps    COPD (chronic obstructive pulmonary disease) (HCC)    Hiatal hernia    Hyperlipidemia    Macular degeneration    Osteoporosis    Pneumonia    PVD (posterior vitreous detachment)    Right knee pain    Shingles rash     Past Surgical History:  Procedure Laterality Date   CATARACT EXTRACTION, BILATERAL     COLONOSCOPY     CORONARY PRESSURE WIRE/FFR WITH 3D MAPPING N/A 03/21/2021   Procedure: Coronary Pressure Wire/FFR w/3D Mapping;  Surgeon: Nelva Bush,  MD;  Location: [facility];  Service: Cardiovascular;  Laterality: N/A;   CRANIECTOMY / CRANIOTOMY FOR EXCISION OF BRAIN TUMOR     Left side brain   LEFT HEART CATH AND CORONARY ANGIOGRAPHY N/A 03/21/2021   Procedure: LEFT HEART CATH AND CORONARY ANGIOGRAPHY;  Surgeon: Nelva Bush, MD;  Location: [facility];  Service: Cardiovascular;  Laterality: N/A;   MASTECTOMY Right    PARTIAL HYSTERECTOMY     POLYPECTOMY     SKIN CANCER EXCISION Left    Under left eye     Current Outpatient Medications  Medication Sig Dispense Refill   albuterol (VENTOLIN HFA) 108 (90 Base) MCG/ACT inhaler INHALE TWO PUFFS BY MOUTH 4 TIMES DAILY AS NEEDED 18 each 2   Calcium Citrate-Vitamin D (CALCIUM CITRATE + D) 315-250 MG-UNIT TABS Take 2 tablets by mouth daily. 120 tablet    Cholecalciferol (VITAMIN D3) 125 MCG (5000 UT) CAPS Take 5,000 Units by mouth daily.     diclofenac Sodium (VOLTAREN) 1 % GEL Apply 2 g topically 4 (four) times daily. 350 g 3   diltiazem (CARDIZEM CD) 300 MG 24 hr capsule TAKE 1 CAPSULE  BY MOUTH EVERY DAY 90 capsule 1   ELIQUIS 5 MG TABS tablet TAKE 1 TABLET BY MOUTH TWICE A DAY 180 tablet 3   esomeprazole (NEXIUM) 40 MG capsule TAKE 1 CAPSULE BY MOUTH EVERY DAY 90 capsule 1   famotidine (PEPCID) 20 MG tablet One after supper 30 tablet 11   ferrous sulfate 325 (65 FE) MG tablet Take 1 tablet (325 mg total) by mouth at bedtime. 90 tablet 3   furosemide (LASIX) 20 MG tablet Take 0.5 tablets (10 mg total) by mouth daily. 45 tablet 1   ipratropium-albuterol (DUONEB) 0.5-2.5 (3) MG/3ML SOLN Take 3 mLs by nebulization every 6 (six) hours as needed. Dx:  Asthma 493.90 360 mL 1   metoprolol tartrate (LOPRESSOR) 25 MG tablet TAKE 1 TABLET BY MOUTH TWICE A DAY 180 tablet 0   montelukast (SINGULAIR) 10 MG tablet TAKE 1 TABLET BY MOUTH EVERYDAY AT BEDTIME 90 tablet 1   Multiple Vitamins-Minerals (PRESERVISION/LUTEIN PO) Take 1 capsule by mouth at bedtime.      nitroGLYCERIN  (NITROLINGUAL) 0.4 MG/SPRAY spray ONE SPRAY UNDER TONGUE AS NEED FOR CHESTPAIN. MAY REPEAT IN 5 MINUTES IF PAIN NOT RELIEVED CALL 911. 12 g 3   Polyethyl Glycol-Propyl Glycol (SYSTANE) 0.4-0.3 % SOLN Place 1 drop into both eyes 2 (two) times daily.     potassium chloride SA (KLOR-CON M20) 20 MEQ tablet Take 1 tablet (20 mEq total) by mouth daily. 90 tablet 3   rosuvastatin (CRESTOR) 20 MG tablet TAKE 1 TABLET BY MOUTH EVERY DAY 90 tablet 3   Budeson-Glycopyrrol-Formoterol (BREZTRI AEROSPHERE) 160-9-4.8 MCG/ACT AERO Inhale 2 puffs into the lungs 2 (two) times daily. (Patient not taking: Reported on 05/14/2022) 10.7 g 11   No current facility-administered medications for this visit.    Allergies:   Actonel [risedronate], Fosamax [alendronate sodium], Mevacor [lovastatin], Miacalcin [calcitonin (salmon)], and Mobic [meloxicam]    ROS:  Please see the history of present illness.   Otherwise, review of systems are positive for none.   All other systems are reviewed and negative.    PHYSICAL EXAM: VS:  BP 120/60   Pulse (!) 56   Ht '5\' 3"'$  (1.6 m)   Wt 164 lb (74.4 kg)   BMI 29.05 kg/m  , BMI Body mass index is 29.05 kg/m. GENERAL:  Well appearing NECK:  No jugular venous distention, waveform within normal limits, carotid upstroke brisk and symmetric, no bruits, no thyromegaly LUNGS:  Clear to auscultation bilaterally CHEST:  Unremarkable HEART:  PMI not displaced or sustained,S1 and S2 within normal limits, no S3, no S4, no clicks, no rubs, no murmurs ABD:  Flat, positive bowel sounds normal in frequency in pitch, no bruits, no rebound, no guarding, no midline pulsatile mass, no hepatomegaly, no splenomegaly EXT:  2 plus pulses throughout, no edema, no cyanosis no clubbing   CATH:   Diagnostic Dominance: Right    EKG:  EKG is  ordered today. Sinus rhythm, rate 56, axis within normal limits, first-degree AV block, no acute ST-T wave changes.  Recent Labs: 10/14/2021: TSH  2.420 01/31/2022: B Natriuretic Peptide 114.0 03/05/2022: ALT 14; BUN 13; Creatinine, Ser 0.77; Hemoglobin 12.8; Platelets 180; Potassium 4.5; Sodium 143    Lipid Panel    Component Value Date/Time   CHOL 127 03/05/2022 1346   CHOL 152 03/04/2013 1212   TRIG 103 03/05/2022 1346   TRIG 93 04/08/2016 1158   TRIG 97 03/04/2013 1212   HDL 61 03/05/2022 1346   HDL 68 04/08/2016 1158  HDL 62 03/04/2013 1212   CHOLHDL 2.1 03/05/2022 1346   CHOLHDL 2.4 03/22/2021 0148   VLDL 17 03/22/2021 0148   LDLCALC 47 03/05/2022 1346   LDLCALC 75 01/27/2014 1147   LDLCALC 71 03/04/2013 1212      Wt Readings from Last 3 Encounters:  05/14/22 164 lb (74.4 kg)  04/03/22 165 lb 3.2 oz (74.9 kg)  03/05/22 167 lb (75.8 kg)      Other studies Reviewed: Additional studies/ records that were reviewed today include: ED records Review of the above records demonstrates: See elsewhere  ASSESSMENT AND PLAN:  SVT: She has had some palpitations but these are manageable.  No change in therapy.   CHEST PAIN:   She has nonobstructive CAD.    She has had no new symptoms consistent with unstable angina or progressive disease.  No change in therapy.  PAF:   Krystal [name] has a CHA2DS2 - VASc score of 3.  She tolerates anticoagulation.  No change in therapy.  DYSLIPIDEMIA:  LDL was 47 in August.  No change in therapy.  VALVULAR HEART DISEASE:   There is mild AI in Feb 2023.  I will follow this clinically.  DYSPNEA:   She has had no acute shortness of breath.  No change in therapy.  Current medicines are reviewed at length with the patient today.  The patient does not have concerns regarding medicines.  The following changes have been made:   None  Labs/ tests ordered today include:    Orders Placed This Encounter  Procedures   EKG 12-Lead     Disposition:   FU with me in 12 months.     Signed, Minus Breeding, MD  05/14/2022 11:10 AM    [place]

## 2022-05-14 ENCOUNTER — Encounter: Payer: Self-pay | Admitting: Cardiology

## 2022-05-14 ENCOUNTER — Ambulatory Visit (INDEPENDENT_AMBULATORY_CARE_PROVIDER_SITE_OTHER): Payer: Medicare HMO | Admitting: Cardiology

## 2022-05-14 VITALS — BP 120/60 | HR 56 | Ht 63.0 in | Wt 164.0 lb

## 2022-05-14 DIAGNOSIS — I351 Nonrheumatic aortic (valve) insufficiency: Secondary | ICD-10-CM | POA: Diagnosis not present

## 2022-05-14 DIAGNOSIS — I471 Supraventricular tachycardia, unspecified: Secondary | ICD-10-CM

## 2022-05-14 DIAGNOSIS — R0602 Shortness of breath: Secondary | ICD-10-CM

## 2022-05-14 DIAGNOSIS — R072 Precordial pain: Secondary | ICD-10-CM | POA: Diagnosis not present

## 2022-05-14 DIAGNOSIS — I480 Paroxysmal atrial fibrillation: Secondary | ICD-10-CM

## 2022-05-14 NOTE — Patient Instructions (Signed)
Medication Instructions:  The current medical regimen is effective;  continue present plan and medications.  *If you need a refill on your cardiac medications before your next appointment, please call your pharmacy*  Follow-Up: At [facility], you and your health needs are our priority.  As part of our continuing mission to provide you with exceptional heart care, we have created designated Provider Care Teams.  These Care Teams include your primary Cardiologist (physician) and Advanced Practice Providers (APPs -  Physician Assistants and Nurse Practitioners) who all work together to provide you with the care you need, when you need it.  We recommend signing up for the patient portal called "MyChart".  Sign up information is provided on this After Visit Summary.  MyChart is used to connect with patients for Virtual Visits (Telemedicine).  Patients are able to view lab/test results, encounter notes, upcoming appointments, etc.  Non-urgent messages can be sent to your provider as well.   To learn more about what you can do with MyChart, go to [email or web address].    Your next appointment:   1 year(s)  The format for your next appointment:   In Person  Provider:   James Hochrein, MD     Important Information About Sugar       

## 2022-05-15 DIAGNOSIS — R69 Illness, unspecified: Secondary | ICD-10-CM | POA: Diagnosis not present

## 2022-05-15 DIAGNOSIS — F419 Anxiety disorder, unspecified: Secondary | ICD-10-CM | POA: Diagnosis not present

## 2022-05-16 DIAGNOSIS — H353124 Nonexudative age-related macular degeneration, left eye, advanced atrophic with subfoveal involvement: Secondary | ICD-10-CM | POA: Diagnosis not present

## 2022-05-16 DIAGNOSIS — H35373 Puckering of macula, bilateral: Secondary | ICD-10-CM | POA: Diagnosis not present

## 2022-05-16 DIAGNOSIS — H43813 Vitreous degeneration, bilateral: Secondary | ICD-10-CM | POA: Diagnosis not present

## 2022-05-16 DIAGNOSIS — H353211 Exudative age-related macular degeneration, right eye, with active choroidal neovascularization: Secondary | ICD-10-CM | POA: Diagnosis not present

## 2022-05-28 ENCOUNTER — Telehealth: Payer: Self-pay

## 2022-05-28 ENCOUNTER — Ambulatory Visit (INDEPENDENT_AMBULATORY_CARE_PROVIDER_SITE_OTHER): Payer: Medicare HMO

## 2022-05-28 DIAGNOSIS — Z23 Encounter for immunization: Secondary | ICD-10-CM

## 2022-05-28 NOTE — Telephone Encounter (Signed)
Patient wants to know if she needs to get  another pneumonia vaccine - please review and advise.

## 2022-05-28 NOTE — Telephone Encounter (Signed)
Can you look this up and see how many vaccines she has, should be 2 after the age of 24 or 1 pneumonia 38 the new dose

## 2022-05-29 NOTE — Telephone Encounter (Signed)
Spoke with pt son , made him aware that pt has completed all of her shots - no need for a 3rd one unless she wants one

## 2022-06-05 ENCOUNTER — Encounter: Payer: Self-pay | Admitting: Family

## 2022-06-05 ENCOUNTER — Ambulatory Visit (INDEPENDENT_AMBULATORY_CARE_PROVIDER_SITE_OTHER): Payer: Medicare HMO | Admitting: Family

## 2022-06-05 DIAGNOSIS — F411 Generalized anxiety disorder: Secondary | ICD-10-CM | POA: Diagnosis not present

## 2022-06-05 DIAGNOSIS — R69 Illness, unspecified: Secondary | ICD-10-CM | POA: Diagnosis not present

## 2022-06-05 MED ORDER — BUSPIRONE HCL 5 MG PO TABS: 5.0000 mg | ORAL_TABLET | Freq: Three times a day (TID) | ORAL | 1 refills | Status: DC | PRN

## 2022-06-05 NOTE — Patient Instructions (Signed)

## 2022-06-05 NOTE — Progress Notes (Signed)
Virtual Visit  Note Due to COVID-19 pandemic this visit was conducted virtually. This visit type was conducted due to national recommendations for restrictions regarding the COVID-19 Pandemic (e.g. social distancing, sheltering in place) in an effort to limit this patient's exposure and mitigate transmission in our community. All issues noted in this document were discussed and addressed.  A physical exam was not performed with this format.  I connected with [name] on 06/05/22 at 12:33 pm  by telephone and verified that I am speaking with the correct person using two identifiers. Krystal [name] is currently located at home and no one is currently with her during visit. The provider, Evelina Dun, FNP is located in their office at time of visit.  I discussed the limitations, risks, security and privacy concerns of performing an evaluation and management service by telephone and the availability of in person appointments. I also discussed with the patient that there may be a patient responsible charge related to this service. The patient expressed understanding and agreed to proceed.  [name], Krystal [name] are scheduled for a virtual visit with your provider today.    Just as we do with appointments in the office, we must obtain your consent to participate.  Your consent will be active for this visit and any virtual visit you may have with one of our providers in the next 365 days.    If you have a MyChart account, I can also send a copy of this consent to you electronically.  All virtual visits are billed to your insurance company just like a traditional visit in the office.  As this is a virtual visit, video technology does not allow for your provider to perform a traditional examination.  This may limit your provider's ability to fully assess your condition.  If your provider identifies any concerns that need to be evaluated in person or the need to arrange testing such as labs, EKG, etc,  we will make arrangements to do so.    Although advances in technology are sophisticated, we cannot ensure that it will always work on either your end or our end.  If the connection with a video visit is poor, we may have to switch to a telephone visit.  With either a video or telephone visit, we are not always able to ensure that we have a secure connection.   I need to obtain your verbal consent now.   Are you willing to proceed with your visit today?   Krystal [name] has provided verbal consent on 06/05/2022 for a virtual visit (video or telephone).   Evelina Dun, [place] 06/05/2022  12:35 PM   History and Present Illness:  Pt calls the office today with increased anxiety. She reports she has taken Valium in the past that helped.  Anxiety Presents for follow-up visit. Symptoms include decreased concentration, depressed mood, hyperventilation, irritability, nervous/anxious behavior and restlessness. Symptoms occur most days. The severity of symptoms is moderate.        Review of Systems  Constitutional:  Positive for irritability.  Psychiatric/Behavioral:  Positive for decreased concentration. The patient is nervous/anxious.   All other systems reviewed and are negative.    Observations/Objective: No SOB or distress noted   Assessment and Plan: 1. Anxiety state Start Buspar 5 mg TID prn  Keep follow up with PCP Stress management  Follow up if symptoms worsen or do not improve  - busPIRone (BUSPAR) 5 MG tablet; Take 1 tablet (5 mg total) by mouth  3 (three) times daily as needed.  Dispense: 90 tablet; Refill: 1     I discussed the assessment and treatment plan with the patient. The patient was provided an opportunity to ask questions and all were answered. The patient agreed with the plan and demonstrated an understanding of the instructions.   The patient was advised to call back or seek an in-person evaluation if the symptoms worsen or if the condition fails to improve  as anticipated.  The above assessment and management plan was discussed with the patient. The patient verbalized understanding of and has agreed to the management plan. Patient is aware to call the clinic if symptoms persist or worsen. Patient is aware when to return to the clinic for a follow-up visit. Patient educated on when it is appropriate to go to the emergency department.   Time call ended:  12:45 pm  I provided 12 minutes of  non face-to-face time during this encounter.    Evelina Dun, FNP

## 2022-06-11 ENCOUNTER — Other Ambulatory Visit: Payer: Self-pay | Admitting: Family Medicine

## 2022-06-11 DIAGNOSIS — R609 Edema, unspecified: Secondary | ICD-10-CM

## 2022-06-21 DIAGNOSIS — Z20822 Contact with and (suspected) exposure to covid-19: Secondary | ICD-10-CM | POA: Diagnosis not present

## 2022-06-21 DIAGNOSIS — J441 Chronic obstructive pulmonary disease with (acute) exacerbation: Secondary | ICD-10-CM | POA: Diagnosis not present

## 2022-06-25 ENCOUNTER — Ambulatory Visit: Payer: Medicare HMO | Admitting: Cardiology

## 2022-06-28 ENCOUNTER — Other Ambulatory Visit: Payer: Self-pay | Admitting: Family

## 2022-06-28 DIAGNOSIS — F411 Generalized anxiety disorder: Secondary | ICD-10-CM

## 2022-07-07 ENCOUNTER — Other Ambulatory Visit: Payer: Self-pay | Admitting: Family Medicine

## 2022-07-07 DIAGNOSIS — R609 Edema, unspecified: Secondary | ICD-10-CM

## 2022-07-14 DIAGNOSIS — R051 Acute cough: Secondary | ICD-10-CM | POA: Diagnosis not present

## 2022-07-14 DIAGNOSIS — J0100 Acute maxillary sinusitis, unspecified: Secondary | ICD-10-CM | POA: Diagnosis not present

## 2022-07-21 DIAGNOSIS — H353211 Exudative age-related macular degeneration, right eye, with active choroidal neovascularization: Secondary | ICD-10-CM | POA: Diagnosis not present

## 2022-07-21 DIAGNOSIS — H35373 Puckering of macula, bilateral: Secondary | ICD-10-CM | POA: Diagnosis not present

## 2022-07-21 DIAGNOSIS — H35033 Hypertensive retinopathy, bilateral: Secondary | ICD-10-CM | POA: Diagnosis not present

## 2022-07-21 DIAGNOSIS — H43813 Vitreous degeneration, bilateral: Secondary | ICD-10-CM | POA: Diagnosis not present

## 2022-07-21 DIAGNOSIS — H353123 Nonexudative age-related macular degeneration, left eye, advanced atrophic without subfoveal involvement: Secondary | ICD-10-CM | POA: Diagnosis not present

## 2022-07-24 ENCOUNTER — Ambulatory Visit (INDEPENDENT_AMBULATORY_CARE_PROVIDER_SITE_OTHER): Payer: Medicare HMO | Admitting: Family Medicine

## 2022-07-24 ENCOUNTER — Encounter: Payer: Self-pay | Admitting: Family Medicine

## 2022-07-24 VITALS — BP 119/64 | HR 75 | Temp 97.4°F | Ht 63.0 in | Wt 171.0 lb

## 2022-07-24 DIAGNOSIS — R69 Illness, unspecified: Secondary | ICD-10-CM | POA: Diagnosis not present

## 2022-07-24 DIAGNOSIS — F339 Major depressive disorder, recurrent, unspecified: Secondary | ICD-10-CM

## 2022-07-24 DIAGNOSIS — F411 Generalized anxiety disorder: Secondary | ICD-10-CM | POA: Diagnosis not present

## 2022-07-24 MED ORDER — SERTRALINE HCL 50 MG PO TABS: 50.0000 mg | ORAL_TABLET | Freq: Every day | ORAL | 5 refills | Status: DC

## 2022-07-24 MED ORDER — HYDROXYZINE PAMOATE 25 MG PO CAPS: 25.0000 mg | ORAL_CAPSULE | Freq: Every evening | ORAL | 2 refills | Status: DC | PRN

## 2022-07-24 NOTE — Progress Notes (Signed)
BP 119/64   Pulse 75   Temp (!) 97.4 F (36.3 C)   Ht '5\' 3"'$  (1.6 m)   Wt 171 lb (77.6 kg)   SpO2 99%   BMI 30.29 kg/m    Subjective:   Patient ID: Krystal [name], female    DOB: 01-Sep-1940, 81 y.o.   MRN: [id number]  HPI: Krystal [name] is a 81 y.o. female presenting on 07/24/2022 for Anxiety   HPI Anxiety and depression Patient is coming in today for anxiety and depression.  She says she struggles a lot with anxiety and depression because this is the time a year that her mother passed away in 10-22-15 but then she also recently had a brother passed away in 04-24-2023 and that has affected her as well and she is coming in today feeling very anxious.  She did talk to when my colleagues a little bit ago to try buspirone but it did not help her at all and she would like to try something different.  She used to be on Valium in the past we were trying to keep her off that because of her age and her fall risk.  She does understand this and will try the other medicines.  She denies any suicidal ideations or thoughts of hurting self.  Relevant past medical, surgical, family and social history reviewed and updated as indicated. Interim medical history since our last visit reviewed. Allergies and medications reviewed and updated.  Review of Systems  Constitutional:  Negative for chills and fever.  Eyes:  Negative for visual disturbance.  Respiratory:  Negative for chest tightness and shortness of breath.   Cardiovascular:  Negative for chest pain and leg swelling.  Genitourinary:  Negative for difficulty urinating and dysuria.  Skin:  Negative for rash.  Neurological:  Negative for dizziness, light-headedness and headaches.  Psychiatric/Behavioral:  Positive for dysphoric mood. Negative for agitation, behavioral problems, self-injury, sleep disturbance and suicidal ideas. The patient is nervous/anxious.   All other systems reviewed and are negative.   Per HPI unless specifically  indicated above   Allergies as of 07/24/2022       Reactions   Actonel [risedronate] Nausea And Vomiting, Other (See Comments)   Throat tightness   Fosamax [alendronate Sodium] Other (See Comments)   esophagitis   Mevacor [lovastatin] Other (See Comments)   myalgias   Miacalcin [calcitonin (salmon)] Nausea And Vomiting   Mobic [meloxicam] Hives, Itching   rash        Medication List        Accurate as of July 24, 2022 12:32 PM. If you have any questions, ask your nurse or doctor.          albuterol 108 (90 Base) MCG/ACT inhaler Commonly known as: VENTOLIN HFA INHALE TWO PUFFS BY MOUTH 4 TIMES DAILY AS NEEDED   Breztri Aerosphere 160-9-4.8 MCG/ACT Aero Generic drug: Budeson-Glycopyrrol-Formoterol Inhale 2 puffs into the lungs 2 (two) times daily.   busPIRone 5 MG tablet Commonly known as: BUSPAR Take 1 tablet (5 mg total) by mouth 3 (three) times daily as needed.   Calcium Citrate-Vitamin D 315-250 MG-UNIT Tabs Commonly known as: Calcium Citrate + D Take 2 tablets by mouth daily.   diclofenac Sodium 1 % Gel Commonly known as: VOLTAREN APPLY 2 GRAMS TO AFFECTED AREA 4 TIMES A DAY   diltiazem 300 MG 24 hr capsule Commonly known as: CARDIZEM CD TAKE 1 CAPSULE BY MOUTH EVERY DAY   Eliquis 5 MG Tabs tablet Generic drug:  apixaban TAKE 1 TABLET BY MOUTH TWICE A DAY   esomeprazole 40 MG capsule Commonly known as: NEXIUM TAKE 1 CAPSULE BY MOUTH EVERY DAY   famotidine 20 MG tablet Commonly known as: Pepcid One after supper   ferrous sulfate 325 (65 FE) MG tablet Take 1 tablet (325 mg total) by mouth at bedtime.   furosemide 20 MG tablet Commonly known as: LASIX TAKE 1/2 TABLET BY MOUTH DAILY   hydrOXYzine 25 MG capsule Commonly known as: VISTARIL Take 1 capsule (25 mg total) by mouth at bedtime as needed. Started by: Fransisca Kaufmann Margaret Staggs, MD   ipratropium-albuterol 0.5-2.5 (3) MG/3ML Soln Commonly known as: DUONEB Take 3 mLs by nebulization every  6 (six) hours as needed. Dx:  Asthma 493.90   metoprolol tartrate 25 MG tablet Commonly known as: LOPRESSOR TAKE 1 TABLET BY MOUTH EVERY DAY AS NEEDED   montelukast 10 MG tablet Commonly known as: SINGULAIR TAKE 1 TABLET BY MOUTH EVERYDAY AT BEDTIME   nitroGLYCERIN 0.4 MG/SPRAY spray Commonly known as: NITROLINGUAL ONE SPRAY UNDER TONGUE AS NEED FOR CHESTPAIN. MAY REPEAT IN 5 MINUTES IF PAIN NOT RELIEVED CALL 911.   potassium chloride SA 20 MEQ tablet Commonly known as: [place]-Con M20 Take 1 tablet (20 mEq total) by mouth daily.   PRESERVISION/LUTEIN PO Take 1 capsule by mouth at bedtime.   rosuvastatin 20 MG tablet Commonly known as: CRESTOR TAKE 1 TABLET BY MOUTH EVERY DAY   sertraline 50 MG tablet Commonly known as: Zoloft Take 1 tablet (50 mg total) by mouth daily. Started by: Fransisca Kaufmann Preesha Benjamin, MD   Systane 0.4-0.3 % Soln Generic drug: Polyethyl Glycol-Propyl Glycol Place 1 drop into both eyes 2 (two) times daily.   Vitamin D3 125 MCG (5000 UT) Caps Take 5,000 Units by mouth daily.         Objective:   BP 119/64   Pulse 75   Temp (!) 97.4 F (36.3 C)   Ht '5\' 3"'$  (1.6 m)   Wt 171 lb (77.6 kg)   SpO2 99%   BMI 30.29 kg/m   Wt Readings from Last 3 Encounters:  07/24/22 171 lb (77.6 kg)  05/14/22 164 lb (74.4 kg)  04/03/22 165 lb 3.2 oz (74.9 kg)    Physical Exam Vitals and nursing note reviewed.  Constitutional:      General: She is not in acute distress.    Appearance: She is well-developed. She is not diaphoretic.  Eyes:     Conjunctiva/sclera: Conjunctivae normal.  Cardiovascular:     Rate and Rhythm: Normal rate and regular rhythm.     Heart sounds: Normal heart sounds. No murmur heard. Pulmonary:     Effort: Pulmonary effort is normal. No respiratory distress.     Breath sounds: Normal breath sounds. No wheezing.  Musculoskeletal:        General: No swelling or tenderness. Normal range of motion.  Skin:    General: Skin is warm and dry.      Findings: No rash.  Neurological:     Mental Status: She is alert and oriented to person, place, and time.     Coordination: Coordination normal.  Psychiatric:        Behavior: Behavior normal.      Assessment & Plan:   Problem List Items Addressed This Visit       Other   Anxiety state - Primary   Relevant Medications   hydrOXYzine (VISTARIL) 25 MG capsule   sertraline (ZOLOFT) 50 MG tablet  Depression, recurrent (HCC)   Relevant Medications   hydrOXYzine (VISTARIL) 25 MG capsule   sertraline (ZOLOFT) 50 MG tablet    Patient has an appointment in a month or 2, keep that appointment and we will see how she is doing. Follow up plan: Return if symptoms worsen or fail to improve.  Counseling provided for all of the vaccine components No orders of the defined types were placed in this encounter.   Caryl Pina, MD [facility] 07/24/2022, 12:32 PM

## 2022-07-30 ENCOUNTER — Telehealth (INDEPENDENT_AMBULATORY_CARE_PROVIDER_SITE_OTHER): Payer: Medicare HMO | Admitting: Nurse Practitioner

## 2022-07-30 ENCOUNTER — Encounter: Payer: Self-pay | Admitting: Nurse Practitioner

## 2022-07-30 DIAGNOSIS — Z20828 Contact with and (suspected) exposure to other viral communicable diseases: Secondary | ICD-10-CM | POA: Diagnosis not present

## 2022-07-30 MED ORDER — OSELTAMIVIR PHOSPHATE 75 MG PO CAPS: 75.0000 mg | ORAL_CAPSULE | Freq: Every day | ORAL | 0 refills | Status: DC

## 2022-07-30 NOTE — Progress Notes (Signed)
Virtual Visit  Note Due to COVID-19 pandemic this visit was conducted virtually. This visit type was conducted due to national recommendations for restrictions regarding the COVID-19 Pandemic (e.g. social distancing, sheltering in place) in an effort to limit this patient's exposure and mitigate transmission in our community. All issues noted in this document were discussed and addressed.  A physical exam was not performed with this format.  I connected with Krystal [name] on 07/30/22 at 11:52 by telephone and verified that I am speaking with the correct person using two identifiers. Krystal [name] is currently located at home and no ohn e is currently with her during visit. The provider, Mary-Margaret Hassell Done, FNP is located in their office at time of visit.  I discussed the limitations, risks, security and privacy concerns of performing an evaluation and management service by telephone and the availability of in person appointments. I also discussed with the patient that there may be a patient responsible charge related to this service. The patient expressed understanding and agreed to proceed.   History and Present Illness:  Patient was exposed to the flu by her son. She has had a few chills the last several days but other wise is good. Would like some tamiflu to prevent her from getting it.      Review of Systems  Constitutional:  Negative for chills, fever and malaise/fatigue.  HENT:  Positive for congestion (slight).   Respiratory:  Negative for cough, sputum production and shortness of breath.   Musculoskeletal:  Negative for myalgias.     Observations/Objective: Alert and oriented- answers all questions appropriately No distress Raspy voice No cough noted  Assessment and Plan: Krystal [name] in today with chief complaint of No chief complaint on file.   1. Exposure to influenza 1. Take meds as prescribed 2. Use a cool mist humidifier especially during the  winter months and when heat has been humid. 3. Use saline nose sprays frequently 4. Saline irrigations of the nose can be very helpful if done frequently.  * 4X daily for 1 week*  * Use of a nettie pot can be helpful with this. Follow directions with this* 5. Drink plenty of fluids 6. Keep thermostat turn down low 7.For any cough or congestion- mucinex if needed 8. For fever or aces or pains- take tylenol or ibuprofen appropriate for age and weight.  * for fevers greater than 101 orally you may alternate ibuprofen and tylenol every  3 hours.    Meds ordered this encounter  Medications   oseltamivir (TAMIFLU) 75 MG capsule    Sig: Take 1 capsule (75 mg total) by mouth daily.    Dispense:  10 capsule    Refill:  0    Order Specific Question:   Supervising Provider    Answer:   Caryl Pina A [[id number]]      Follow Up Instructions: prn    I discussed the assessment and treatment plan with the patient. The patient was provided an opportunity to ask questions and all were answered. The patient agreed with the plan and demonstrated an understanding of the instructions.   The patient was advised to call back or seek an in-person evaluation if the symptoms worsen or if the condition fails to improve as anticipated.  The above assessment and management plan was discussed with the patient. The patient verbalized understanding of and has agreed to the management plan. Patient is aware to call the clinic if symptoms persist or worsen. Patient is  aware when to return to the clinic for a follow-up visit. Patient educated on when it is appropriate to go to the emergency department.   Time call ended:  12:00  I provided 7 minutes of  non face-to-face time during this encounter.    Mary-Margaret Hassell Done, FNP

## 2022-07-30 NOTE — Patient Instructions (Signed)
1. Take meds as prescribed 2. Use a cool mist humidifier especially during the winter months and when heat has been humid. 3. Use saline nose sprays frequently 4. Saline irrigations of the nose can be very helpful if done frequently.  * 4X daily for 1 week*  * Use of a nettie pot can be helpful with this. Follow directions with this* 5. Drink plenty of fluids 6. Keep thermostat turn down low 7.For any cough or congestion- mucinex OTC as needed 8. For fever or aces or pains- take tylenol or ibuprofen appropriate for age and weight.  * for fevers greater than 101 orally you may alternate ibuprofen and tylenol every  3 hours.

## 2022-08-06 ENCOUNTER — Other Ambulatory Visit: Payer: Self-pay | Admitting: Family

## 2022-08-06 DIAGNOSIS — F411 Generalized anxiety disorder: Secondary | ICD-10-CM

## 2022-08-12 NOTE — Patient Instructions (Incomplete)
Krystal [name] , Thank you for taking time to come for your Medicare Wellness Visit. I appreciate your ongoing commitment to your health goals. Please review the following plan we discussed and let me know if I can assist you in the future.   These are the goals we discussed:  Goals      DIET - EAT MORE FRUITS AND VEGETABLES     DIET - INCREASE WATER INTAKE     Exercise 150 min/wk Moderate Activity        This is a list of the screening recommended for you and due dates:  Health Maintenance  Topic Date Due   COVID-19 Vaccine (4 - 2023-24 season) 04/04/2022   Medicare Annual Wellness Visit  08/09/2022   Colon Cancer Screening  02/11/2023*   DEXA scan (bone density measurement)  03/02/2023   DTaP/Tdap/Td vaccine (2 - Td or Tdap) 08/07/2030   Pneumonia Vaccine  Completed   Flu Shot  Completed   Zoster (Shingles) Vaccine  Completed   HPV Vaccine  Aged Out  *Topic was postponed. The date shown is not the original due date.    Advanced directives: ***  Conditions/risks identified: Aim for 30 minutes of exercise or brisk walking, 6-8 glasses of water, and 5 servings of fruits and vegetables each day.   Next appointment: Follow up in one year for your annual wellness visit    Preventive Care 65 Years and Older, Female Preventive care refers to lifestyle choices and visits with your health care provider that can promote health and wellness. What does preventive care include? A yearly physical exam. This is also called an annual well check. Dental exams once or twice a year. Routine eye exams. Ask your health care provider how often you should have your eyes checked. Personal lifestyle choices, including: Daily care of your teeth and gums. Regular physical activity. Eating a healthy diet. Avoiding tobacco and drug use. Limiting alcohol use. Practicing safe sex. Taking low-dose aspirin every day. Taking vitamin and mineral supplements as recommended by your health care  provider. What happens during an annual well check? The services and screenings done by your health care provider during your annual well check will depend on your age, overall health, lifestyle risk factors, and family history of disease. Counseling  Your health care provider may ask you questions about your: Alcohol use. Tobacco use. Drug use. Emotional well-being. Home and relationship well-being. Sexual activity. Eating habits. History of falls. Memory and ability to understand (cognition). Work and work Statistician. Reproductive health. Screening  You may have the following tests or measurements: Height, weight, and BMI. Blood pressure. Lipid and cholesterol levels. These may be checked every 5 years, or more frequently if you are over 73 years old. Skin check. Lung cancer screening. You may have this screening every year starting at age 16 if you have a 30-pack-year history of smoking and currently smoke or have quit within the past 15 years. Fecal occult blood test (FOBT) of the stool. You may have this test every year starting at age 66. Flexible sigmoidoscopy or colonoscopy. You may have a sigmoidoscopy every 5 years or a colonoscopy every 10 years starting at age 55. Hepatitis C blood test. Hepatitis B blood test. Sexually transmitted disease (STD) testing. Diabetes screening. This is done by checking your blood sugar (glucose) after you have not eaten for a while (fasting). You may have this done every 1-3 years. Bone density scan. This is done to screen for osteoporosis. You may have  this done starting at age 58. Mammogram. This may be done every 1-2 years. Talk to your health care provider about how often you should have regular mammograms. Talk with your health care provider about your test results, treatment options, and if necessary, the need for more tests. Vaccines  Your health care provider may recommend certain vaccines, such as: Influenza vaccine. This is  recommended every year. Tetanus, diphtheria, and acellular pertussis (Tdap, Td) vaccine. You may need a Td booster every 10 years. Zoster vaccine. You may need this after age 61. Pneumococcal 13-valent conjugate (PCV13) vaccine. One dose is recommended after age 23. Pneumococcal polysaccharide (PPSV23) vaccine. One dose is recommended after age 51. Talk to your health care provider about which screenings and vaccines you need and how often you need them. This information is not intended to replace advice given to you by your health care provider. Make sure you discuss any questions you have with your health care provider. Document Released: 08/17/2015 Document Revised: 04/09/2016 Document Reviewed: 05/22/2015 Elsevier Interactive Patient Education  2017 [place] Prevention in the Home Falls can cause injuries. They can happen to people of all ages. There are many things you can do to make your home safe and to help prevent falls. What can I do on the outside of my home? Regularly fix the edges of walkways and driveways and fix any cracks. Remove anything that might make you trip as you walk through a door, such as a raised step or threshold. Trim any bushes or trees on the path to your home. Use bright outdoor lighting. Clear any walking paths of anything that might make someone trip, such as rocks or tools. Regularly check to see if handrails are loose or broken. Make sure that both sides of any steps have handrails. Any raised decks and porches should have guardrails on the edges. Have any leaves, snow, or ice cleared regularly. Use sand or salt on walking paths during winter. Clean up any spills in your garage right away. This includes oil or grease spills. What can I do in the bathroom? Use night lights. Install grab bars by the toilet and in the tub and shower. Do not use towel bars as grab bars. Use non-skid mats or decals in the tub or shower. If you need to sit down in  the shower, use a plastic, non-slip stool. Keep the floor dry. Clean up any water that spills on the floor as soon as it happens. Remove soap buildup in the tub or shower regularly. Attach bath mats securely with double-sided non-slip rug tape. Do not have throw rugs and other things on the floor that can make you trip. What can I do in the bedroom? Use night lights. Make sure that you have a light by your bed that is easy to reach. Do not use any sheets or blankets that are too big for your bed. They should not hang down onto the floor. Have a firm chair that has side arms. You can use this for support while you get dressed. Do not have throw rugs and other things on the floor that can make you trip. What can I do in the kitchen? Clean up any spills right away. Avoid walking on wet floors. Keep items that you use a lot in easy-to-reach places. If you need to reach something above you, use a strong step stool that has a grab bar. Keep electrical cords out of the way. Do not use floor polish or  wax that makes floors slippery. If you must use wax, use non-skid floor wax. Do not have throw rugs and other things on the floor that can make you trip. What can I do with my stairs? Do not leave any items on the stairs. Make sure that there are handrails on both sides of the stairs and use them. Fix handrails that are broken or loose. Make sure that handrails are as long as the stairways. Check any carpeting to make sure that it is firmly attached to the stairs. Fix any carpet that is loose or worn. Avoid having throw rugs at the top or bottom of the stairs. If you do have throw rugs, attach them to the floor with carpet tape. Make sure that you have a light switch at the top of the stairs and the bottom of the stairs. If you do not have them, ask someone to add them for you. What else can I do to help prevent falls? Wear shoes that: Do not have high heels. Have rubber bottoms. Are comfortable  and fit you well. Are closed at the toe. Do not wear sandals. If you use a stepladder: Make sure that it is fully opened. Do not climb a closed stepladder. Make sure that both sides of the stepladder are locked into place. Ask someone to hold it for you, if possible. Clearly mark and make sure that you can see: Any grab bars or handrails. First and last steps. Where the edge of each step is. Use tools that help you move around (mobility aids) if they are needed. These include: Canes. Walkers. Scooters. Crutches. Turn on the lights when you go into a dark area. Replace any light bulbs as soon as they burn out. Set up your furniture so you have a clear path. Avoid moving your furniture around. If any of your floors are uneven, fix them. If there are any pets around you, be aware of where they are. Review your medicines with your doctor. Some medicines can make you feel dizzy. This can increase your chance of falling. Ask your doctor what other things that you can do to help prevent falls. This information is not intended to replace advice given to you by your health care provider. Make sure you discuss any questions you have with your health care provider. Document Released: 05/17/2009 Document Revised: 12/27/2015 Document Reviewed: 08/25/2014 Elsevier Interactive Patient Education  2017 [employer].

## 2022-08-12 NOTE — Progress Notes (Signed)
Subjective:   Krystal [name] is a 82 y.o. female who presents for an Initial Medicare Annual Wellness Visit.  I connected with  [name] on 08/13/22 by a audio enabled telemedicine application and verified that I am speaking with the correct person using two identifiers.  Patient Location: Home  Provider Location: Home Office  I discussed the limitations of evaluation and management by telemedicine. The patient expressed understanding and agreed to proceed.  Review of Systems     Cardiac Risk Factors include: advanced age (>13mn, >>106women);dyslipidemia;hypertension     Objective:    Today's Vitals   08/13/22 1355  Weight: 170 lb (77.1 kg)  Height: '5\' 3"'$  (1.6 m)   Body mass index is 30.11 kg/m.     08/13/2022    1:51 PM 01/31/2022    7:17 PM 04/21/2021    6:08 AM 04/20/2021   10:02 PM 04/17/2021    1:27 PM 03/20/2021   10:10 PM 03/20/2021    1:07 PM  Advanced Directives  Does Patient Have a Medical Advance Directive? No No  No No No No  Would patient like information on creating a medical advance directive? No - Patient declined No - Patient declined No - Patient declined   No - Patient declined     Current Medications (verified) Outpatient Encounter Medications as of 08/13/2022  Medication Sig   albuterol (VENTOLIN HFA) 108 (90 Base) MCG/ACT inhaler INHALE TWO PUFFS BY MOUTH 4 TIMES DAILY AS NEEDED   busPIRone (BUSPAR) 5 MG tablet TAKE 1 TABLET BY MOUTH 3 TIMES DAILY AS NEEDED.   Calcium Citrate-Vitamin D (CALCIUM CITRATE + D) 315-250 MG-UNIT TABS Take 2 tablets by mouth daily.   Cholecalciferol (VITAMIN D3) 125 MCG (5000 UT) CAPS Take 5,000 Units by mouth daily.   diclofenac Sodium (VOLTAREN) 1 % GEL APPLY 2 GRAMS TO AFFECTED AREA 4 TIMES A DAY   diltiazem (CARDIZEM CD) 300 MG 24 hr capsule TAKE 1 CAPSULE BY MOUTH EVERY DAY   ELIQUIS 5 MG TABS tablet TAKE 1 TABLET BY MOUTH TWICE A DAY   esomeprazole (NEXIUM) 40 MG capsule TAKE 1 CAPSULE BY MOUTH EVERY  DAY   famotidine (PEPCID) 20 MG tablet One after supper   ferrous sulfate 325 (65 FE) MG tablet Take 1 tablet (325 mg total) by mouth at bedtime.   furosemide (LASIX) 20 MG tablet TAKE 1/2 TABLET BY MOUTH DAILY   hydrOXYzine (VISTARIL) 25 MG capsule Take 1 capsule (25 mg total) by mouth at bedtime as needed.   ipratropium-albuterol (DUONEB) 0.5-2.5 (3) MG/3ML SOLN Take 3 mLs by nebulization every 6 (six) hours as needed. Dx:  Asthma 493.90   metoprolol tartrate (LOPRESSOR) 25 MG tablet TAKE 1 TABLET BY MOUTH EVERY DAY AS NEEDED   montelukast (SINGULAIR) 10 MG tablet TAKE 1 TABLET BY MOUTH EVERYDAY AT BEDTIME   Multiple Vitamins-Minerals (PRESERVISION/LUTEIN PO) Take 1 capsule by mouth at bedtime.    nitroGLYCERIN (NITROLINGUAL) 0.4 MG/SPRAY spray ONE SPRAY UNDER TONGUE AS NEED FOR CHESTPAIN. MAY REPEAT IN 5 MINUTES IF PAIN NOT RELIEVED CALL 911.   Polyethyl Glycol-Propyl Glycol (SYSTANE) 0.4-0.3 % SOLN Place 1 drop into both eyes 2 (two) times daily.   potassium chloride SA (KLOR-CON M20) 20 MEQ tablet Take 1 tablet (20 mEq total) by mouth daily.   rosuvastatin (CRESTOR) 20 MG tablet TAKE 1 TABLET BY MOUTH EVERY DAY   sertraline (ZOLOFT) 50 MG tablet Take 1 tablet (50 mg total) by mouth daily.   Budeson-Glycopyrrol-Formoterol (BREZTRI  AEROSPHERE) 160-9-4.8 MCG/ACT AERO Inhale 2 puffs into the lungs 2 (two) times daily. (Patient not taking: Reported on 08/13/2022)   [DISCONTINUED] oseltamivir (TAMIFLU) 75 MG capsule Take 1 capsule (75 mg total) by mouth daily.   No facility-administered encounter medications on file as of 08/13/2022.    Allergies (verified) Actonel [risedronate], Fosamax [alendronate sodium], Mevacor [lovastatin], Miacalcin [calcitonin (salmon)], and Mobic [meloxicam]   History: Past Medical History:  Diagnosis Date   Anemia    Asthma    Brain tumor ([place])    1986   Breast cancer ([place])    Right breast   Cataract    Colon polyps    COPD (chronic obstructive pulmonary  disease) ([place])    Hiatal hernia    Hyperlipidemia    Macular degeneration    Osteoporosis    Pneumonia    PVD (posterior vitreous detachment)    Right knee pain    Shingles rash    Past Surgical History:  Procedure Laterality Date   CATARACT EXTRACTION, BILATERAL     COLONOSCOPY     CORONARY PRESSURE WIRE/FFR WITH 3D MAPPING N/A 03/21/2021   Procedure: Coronary Pressure Wire/FFR w/3D Mapping;  Surgeon: Nelva Bush, MD;  Location: [facility];  Service: Cardiovascular;  Laterality: N/A;   CRANIECTOMY / CRANIOTOMY FOR EXCISION OF BRAIN TUMOR     Left side brain   LEFT HEART CATH AND CORONARY ANGIOGRAPHY N/A 03/21/2021   Procedure: LEFT HEART CATH AND CORONARY ANGIOGRAPHY;  Surgeon: Nelva Bush, MD;  Location: [facility];  Service: Cardiovascular;  Laterality: N/A;   MASTECTOMY Right    PARTIAL HYSTERECTOMY     POLYPECTOMY     SKIN CANCER EXCISION Left    Under left eye   Family History  Problem Relation Age of Onset   Heart disease Mother 29       Stroke    Dementia Mother    Congestive Heart Failure Mother    Osteoporosis Mother    Stroke Mother    Heart disease Sister    Hypertension Sister    Diabetes Sister    Osteoporosis Sister    Heart disease Brother    Hyperlipidemia Brother    Diabetes Brother    Heart disease Sister    Hypertension Sister    Diabetes Sister    Diabetes Sister    Hypertension Sister    Diabetes Sister    Hypertension Sister    Diabetes Brother    Hyperlipidemia Brother    Cancer Father        lung   Heart attack Father    Diabetes Brother    Heart disease Brother    Hyperlipidemia Brother    Diabetes Brother    Diabetes Son    Colon cancer Neg Hx    Rectal cancer Neg Hx    Stomach cancer Neg Hx    Esophageal cancer Neg Hx    Social History   Socioeconomic History   Marital status: Widowed    Spouse name: Not on file   Number of children: 1   Years of education: 7th   Highest education level: 7th  grade  Occupational History   Occupation: retired  Tobacco Use   Smoking status: Former    Types: Cigarettes    Quit date: 02/09/1961    Years since quitting: 61.5   Smokeless tobacco: Never  Vaping Use   Vaping Use: Never used  Substance and Sexual Activity   Alcohol use: No  Alcohol/week: 0.0 standard drinks of alcohol   Drug use: No   Sexual activity: Not Currently  Other Topics Concern   Not on file  Social History Narrative   Son lives with patient.   Retired from farming.   Highest level of education:  7th   Social Determinants of Health   Financial Resource Strain: Low Risk  (08/13/2022)   Overall Financial Resource Strain (CARDIA)    Difficulty of Paying Living Expenses: Not hard at all  Food Insecurity: No Food Insecurity (08/13/2022)   Hunger Vital Sign    Worried About Running Out of Food in the Last Year: Never true    Ran Out of Food in the Last Year: Never true  Transportation Needs: No Transportation Needs (08/13/2022)   PRAPARE - Hydrologist (Medical): No    Lack of Transportation (Non-Medical): No  Physical Activity: Sufficiently Active (08/13/2022)   Exercise Vital Sign    Days of Exercise per Week: 5 days    Minutes of Exercise per Session: 30 min  Stress: No Stress Concern Present (08/13/2022)   [place]    Feeling of Stress : Not at all  Social Connections: Socially Isolated (08/13/2022)   Social Connection and Isolation Panel [NHANES]    Frequency of Communication with Friends and Family: More than three times a week    Frequency of Social Gatherings with Friends and Family: Three times a week    Attends Religious Services: Never    Active Member of Clubs or Organizations: No    Attends Archivist Meetings: Never    Marital Status: Widowed    Tobacco Counseling Counseling given: Not Answered   Clinical Intake:  Pre-visit preparation  completed: Yes  Pain : No/denies pain  Diabetes: No  How often do you need to have someone help you when you read instructions, pamphlets, or other written materials from your doctor or pharmacy?: 1 - Never  Diabetic?No   Interpreter Needed?: No  Information entered by :: Denman George LPN   Activities of Daily Living    08/13/2022    1:51 PM  In your present state of health, do you have any difficulty performing the following activities:  Hearing? 0  Vision? 0  Difficulty concentrating or making decisions? 0  Walking or climbing stairs? 0  Dressing or bathing? 0  Doing errands, shopping? 0  Preparing Food and eating ? N  Using the Toilet? N  In the past six months, have you accidently leaked urine? N  Do you have problems with loss of bowel control? N  Managing your Medications? N  Managing your Finances? N  Housekeeping or managing your Housekeeping? N    Patient Care Team: Dettinger, Fransisca Kaufmann, MD as PCP - General (Family Medicine) Milus Banister, MD (Gastroenterology) Satira Sark, MD as Consulting Physician (Cardiology) Minus Breeding, MD as Consulting Physician (Cardiology) Tanda Rockers, MD as Consulting Physician (Pulmonary Disease) Princess Bruins, MD as Referring Physician (Ophthalmology)  Indicate any recent Medical Services you may have received from other than [facility] providers in the past year (date may be approximate).     Assessment:   This is a routine wellness examination for Krystal [name].  Hearing/Vision screen Hearing Screening - Comments:: Denies hearing difficulties  Vision Screening - Comments:: Wears rx glasses - up to date with routine eye exams with Dr. Jule Ser   Dietary issues and exercise activities discussed: Current Exercise Habits: Home  exercise routine, Type of exercise: walking, Time (Minutes): 30, Frequency (Times/Week): 5, Weekly Exercise (Minutes/Week): 150, Intensity: Mild   Goals Addressed             This Visit's  Progress    DIET - EAT MORE FRUITS AND VEGETABLES   On track    DIET - INCREASE WATER INTAKE   On track      Depression Screen    08/13/2022    1:50 PM 02/10/2022    4:23 PM 09/04/2021    1:37 PM 08/09/2021    2:09 PM 06/03/2021    1:50 PM 05/02/2021    1:55 PM 03/28/2021    2:06 PM  PHQ 2/9 Scores  PHQ - 2 Score 0 0 0 0 0 0 0  PHQ- 9 Score     0      Fall Risk    08/13/2022    1:54 PM 02/10/2022    4:23 PM 09/04/2021    1:37 PM 06/03/2021    1:50 PM 05/02/2021    1:54 PM  [place]uda in the past year? 0 0 0 0 0  Number falls in past yr: 0      Injury with Fall? 0      Risk for fall due to : Impaired balance/gait;Impaired mobility      Follow up Falls evaluation completed;Education provided;Falls prevention discussed        FALL RISK PREVENTION PERTAINING TO THE HOME:  Any stairs in or around the home? No  If so, are there any without handrails? No  Home free of loose throw rugs in walkways, pet beds, electrical cords, etc? Yes  Adequate lighting in your home to reduce risk of falls? Yes   ASSISTIVE DEVICES UTILIZED TO PREVENT FALLS:  Life alert? No  Use of a cane, walker or w/c? Yes  Grab bars in the bathroom? Yes  Shower chair or bench in shower? No  Elevated toilet seat or a handicapped toilet? Yes   TIMED UP AND GO:  Was the test performed? No . Telephonic visit   Cognitive Function:    01/13/2018   11:34 AM 01/05/2017   11:46 AM 12/10/2015   11:34 AM 10/18/2014   11:36 AM  MMSE - Mini Mental State Exam  Orientation to time '5 5 5 5  '$ Orientation to Place '5 5 5 5  '$ Registration '3 3 3 3  '$ Attention/ Calculation '4 5 5 5  '$ Recall '2 2 3 3  '$ Language- name 2 objects '2 2 2 2  '$ Language- repeat '1 1 1 1  '$ Language- follow 3 step command '3 1 3 3  '$ Language- read & follow direction '1 1 1 1  '$ Write a sentence '1 1 1 1  '$ Copy design 0 '1 1 1  '$ Total score '27 27 30 30        '$ 08/13/2022    1:52 PM 08/09/2021    2:16 PM 08/08/2020   10:18 AM 02/23/2019    2:39 PM  6CIT Screen   What Year? 0 points 0 points 0 points 0 points  What month? 0 points 0 points 0 points 0 points  What time? 0 points 0 points 0 points 0 points  Count back from 20 0 points 0 points 0 points 0 points  Months in reverse 2 points 0 points 0 points 0 points  Repeat phrase 2 points 2 points 4 points 0 points  Total Score 4 points 2 points 4 points 0 points  Immunizations Immunization History  Administered Date(s) Administered   Fluad Quad(high Dose 65+) 05/03/2020, 05/02/2021, 05/28/2022   Moderna Sars-Covid-2 Vaccination 08/27/2019, 09/26/2019, 03/21/2020   Pneumococcal Conjugate-13 06/21/2013   Pneumococcal Polysaccharide-23 04/26/2005   Tdap 08/07/2020   Zoster Recombinat (Shingrix) 02/06/2021, 09/04/2021   Zoster, Live 02/06/2021    TDAP status: Up to date  Flu Vaccine status: Up to date  Pneumococcal vaccine status: Up to date  Covid-19 vaccine status: Information provided on how to obtain vaccines.   Qualifies for Shingles Vaccine? Yes   Zostavax completed No   Shingrix Completed?: Yes  Screening Tests Health Maintenance  Topic Date Due   COVID-19 Vaccine (4 - 2023-24 season) 04/04/2022   COLONOSCOPY (Pts 45-4yr Insurance coverage will need to be confirmed)  02/11/2023 (Originally 04/07/2020)   DEXA SCAN  03/02/2023   Medicare Annual Wellness (AWV)  08/14/2023   DTaP/Tdap/Td (2 - Td or Tdap) 08/07/2030   Pneumonia Vaccine 82 Years old  Completed   INFLUENZA VACCINE  Completed   Zoster Vaccines- Shingrix  Completed   HPV VACCINES  Aged Out    Health Maintenance  Health Maintenance Due  Topic Date Due   COVID-19 Vaccine (4 - 2023-24 season) 04/04/2022    Colorectal cancer screening: No longer required.   Mammogram status: No longer required due to age.  Bone Density status: Completed 03/01/21. Results reflect: Bone density results: OSTEOPOROSIS. Repeat every 2 years.  Lung Cancer Screening: (Low Dose CT Chest recommended if Age 84-80years, 30 pack-year  currently smoking OR have quit w/in 15years.) does not qualify.   Lung Cancer Screening Referral: n/a  Additional Screening:  Hepatitis C Screening: does not qualify  Vision Screening: Recommended annual ophthalmology exams for early detection of glaucoma and other disorders of the eye. Is the patient up to date with their annual eye exam?  Yes  Who is the provider or what is the name of the office in which the patient attends annual eye exams? Dr. GJule Ser If pt is not established with a provider, would they like to be referred to a provider to establish care? No .   Dental Screening: Recommended annual dental exams for proper oral hygiene  Community Resource Referral / Chronic Care Management: CRR required this visit?  No   CCM required this visit?  No      Plan:     I have personally reviewed and noted the following in the patient's chart:   Medical and social history Use of alcohol, tobacco or illicit drugs  Current medications and supplements including opioid prescriptions. Patient is not currently taking opioid prescriptions. Functional ability and status Nutritional status Physical activity Advanced directives List of other physicians Hospitalizations, surgeries, and ER visits in previous 12 months Vitals Screenings to include cognitive, depression, and falls Referrals and appointments  In addition, I have reviewed and discussed with patient certain preventive protocols, quality metrics, and best practice recommendations. A written personalized care plan for preventive services as well as general preventive health recommendations were provided to patient.     SVanetta Mulders LPN   [id number]/54/[id number]  Due to this being a virtual visit, the after visit summary with patients personalized plan was offered to patient via mail or my-chart.  per request, patient was mailed a copy of AVS  Nurse Notes: No concerns

## 2022-08-13 ENCOUNTER — Ambulatory Visit (INDEPENDENT_AMBULATORY_CARE_PROVIDER_SITE_OTHER): Payer: Medicare HMO

## 2022-08-13 VITALS — Ht 63.0 in | Wt 170.0 lb

## 2022-08-13 DIAGNOSIS — Z0000 Encounter for general adult medical examination without abnormal findings: Secondary | ICD-10-CM | POA: Diagnosis not present

## 2022-08-16 ENCOUNTER — Other Ambulatory Visit: Payer: Self-pay | Admitting: Family Medicine

## 2022-08-16 DIAGNOSIS — F411 Generalized anxiety disorder: Secondary | ICD-10-CM

## 2022-08-16 DIAGNOSIS — F339 Major depressive disorder, recurrent, unspecified: Secondary | ICD-10-CM

## 2022-08-19 ENCOUNTER — Other Ambulatory Visit: Payer: Self-pay | Admitting: Family

## 2022-08-19 DIAGNOSIS — F411 Generalized anxiety disorder: Secondary | ICD-10-CM

## 2022-08-25 DIAGNOSIS — H43393 Other vitreous opacities, bilateral: Secondary | ICD-10-CM | POA: Diagnosis not present

## 2022-08-25 DIAGNOSIS — H353123 Nonexudative age-related macular degeneration, left eye, advanced atrophic without subfoveal involvement: Secondary | ICD-10-CM | POA: Diagnosis not present

## 2022-08-25 DIAGNOSIS — H43813 Vitreous degeneration, bilateral: Secondary | ICD-10-CM | POA: Diagnosis not present

## 2022-08-25 DIAGNOSIS — H353211 Exudative age-related macular degeneration, right eye, with active choroidal neovascularization: Secondary | ICD-10-CM | POA: Diagnosis not present

## 2022-08-25 DIAGNOSIS — H35033 Hypertensive retinopathy, bilateral: Secondary | ICD-10-CM | POA: Diagnosis not present

## 2022-08-25 DIAGNOSIS — H35373 Puckering of macula, bilateral: Secondary | ICD-10-CM | POA: Diagnosis not present

## 2022-09-05 ENCOUNTER — Encounter: Payer: Self-pay | Admitting: Family Medicine

## 2022-09-05 ENCOUNTER — Ambulatory Visit (INDEPENDENT_AMBULATORY_CARE_PROVIDER_SITE_OTHER): Payer: Medicare HMO | Admitting: Family Medicine

## 2022-09-05 VITALS — BP 108/65 | HR 71 | Ht 63.0 in | Wt 158.0 lb

## 2022-09-05 DIAGNOSIS — F411 Generalized anxiety disorder: Secondary | ICD-10-CM

## 2022-09-05 DIAGNOSIS — F339 Major depressive disorder, recurrent, unspecified: Secondary | ICD-10-CM

## 2022-09-05 DIAGNOSIS — I480 Paroxysmal atrial fibrillation: Secondary | ICD-10-CM

## 2022-09-05 DIAGNOSIS — R609 Edema, unspecified: Secondary | ICD-10-CM | POA: Diagnosis not present

## 2022-09-05 DIAGNOSIS — I2510 Atherosclerotic heart disease of native coronary artery without angina pectoris: Secondary | ICD-10-CM | POA: Diagnosis not present

## 2022-09-05 DIAGNOSIS — J4520 Mild intermittent asthma, uncomplicated: Secondary | ICD-10-CM

## 2022-09-05 DIAGNOSIS — R69 Illness, unspecified: Secondary | ICD-10-CM | POA: Diagnosis not present

## 2022-09-05 DIAGNOSIS — E782 Mixed hyperlipidemia: Secondary | ICD-10-CM

## 2022-09-05 DIAGNOSIS — R600 Localized edema: Secondary | ICD-10-CM

## 2022-09-05 MED ORDER — BUSPIRONE HCL 5 MG PO TABS: 5.0000 mg | ORAL_TABLET | Freq: Three times a day (TID) | ORAL | 3 refills | Status: DC | PRN

## 2022-09-05 MED ORDER — FUROSEMIDE 20 MG PO TABS: 10.0000 mg | ORAL_TABLET | Freq: Every day | ORAL | 3 refills | Status: DC

## 2022-09-05 MED ORDER — SERTRALINE HCL 50 MG PO TABS: 50.0000 mg | ORAL_TABLET | Freq: Every day | ORAL | 3 refills | Status: DC

## 2022-09-05 MED ORDER — METOPROLOL TARTRATE 25 MG PO TABS: 25.0000 mg | ORAL_TABLET | Freq: Every day | ORAL | 3 refills | Status: DC | PRN

## 2022-09-05 MED ORDER — IPRATROPIUM-ALBUTEROL 0.5-2.5 (3) MG/3ML IN SOLN: 3.0000 mL | Freq: Four times a day (QID) | RESPIRATORY_TRACT | 3 refills | Status: DC | PRN

## 2022-09-05 MED ORDER — ALBUTEROL SULFATE HFA 108 (90 BASE) MCG/ACT IN AERS: INHALATION_SPRAY | RESPIRATORY_TRACT | 2 refills | Status: DC

## 2022-09-05 MED ORDER — ESOMEPRAZOLE MAGNESIUM 40 MG PO CPDR: 40.0000 mg | DELAYED_RELEASE_CAPSULE | Freq: Every day | ORAL | 3 refills | Status: DC

## 2022-09-05 MED ORDER — DILTIAZEM HCL ER COATED BEADS 300 MG PO CP24: ORAL_CAPSULE | ORAL | 3 refills | Status: DC

## 2022-09-05 MED ORDER — HYDROXYZINE PAMOATE 25 MG PO CAPS: 25.0000 mg | ORAL_CAPSULE | Freq: Every evening | ORAL | 3 refills | Status: DC | PRN

## 2022-09-05 MED ORDER — MONTELUKAST SODIUM 10 MG PO TABS: ORAL_TABLET | ORAL | 3 refills | Status: DC

## 2022-09-05 NOTE — Progress Notes (Signed)
BP 108/65   Pulse 71   Ht '5\' 3"'$  (1.6 m)   Wt 158 lb (71.7 kg)   SpO2 94%   BMI 27.99 kg/m    Subjective:   Patient ID: Krystal [name], female    DOB: 05/05/1941, 82 y.o.   MRN: [id number]  HPI: Krystal [name] is a 82 y.o. female presenting on 09/05/2022 for Medical Management of Chronic Issues, Hyperlipidemia, Anxiety, and Depression   HPI Allergy and asthma recheck Patient is coming in today for allergy and asthma recheck.  She currently uses albuterol and Breztri and Singulair and she says she is doing well on it, she says every now and then she has to use rescue inhaler but not that often.  Anxiety depression recheck Patient is coming in today for anxiety depression recheck.  She is currently on Zoloft and buspirone and feels like they do very well for her.  She denies any major issues with it and says her anxiety is under control.  She denies any suicidal ideations or thoughts of hurting herself.  Hyperlipidemia Patient is coming in for recheck of his hyperlipidemia. The patient is currently taking Crestor. They deny any issues with myalgias or history of liver damage from it. They deny any focal numbness or weakness or chest pain.   A-fib and CAD, patient sees cardiologist Patient is currently on Cardizem and furosemide and metoprolol and Eliquis, and their blood pressure today is 108/65. Patient denies any lightheadedness or dizziness. Patient denies headaches, blurred vision, chest pains, shortness of breath, or weakness. Denies any side effects from medication and is content with current medication.   Relevant past medical, surgical, family and social history reviewed and updated as indicated. Interim medical history since our last visit reviewed. Allergies and medications reviewed and updated.  Review of Systems  Constitutional:  Negative for chills and fever.  HENT:  Negative for congestion, ear discharge and ear pain.   Eyes:  Negative for redness and visual  disturbance.  Respiratory:  Negative for chest tightness and shortness of breath.   Cardiovascular:  Negative for chest pain and leg swelling.  Genitourinary:  Negative for difficulty urinating and dysuria.  Musculoskeletal:  Negative for back pain and gait problem.  Skin:  Negative for rash.  Neurological:  Negative for dizziness, light-headedness and headaches.  Psychiatric/Behavioral:  Negative for agitation and behavioral problems.   All other systems reviewed and are negative.   Per HPI unless specifically indicated above   Allergies as of 09/05/2022       Reactions   Actonel [risedronate] Nausea And Vomiting, Other (See Comments)   Throat tightness   Fosamax [alendronate Sodium] Other (See Comments)   esophagitis   Mevacor [lovastatin] Other (See Comments)   myalgias   Miacalcin [calcitonin (salmon)] Nausea And Vomiting   Mobic [meloxicam] Hives, Itching   rash        Medication List        Accurate as of September 05, 2022  1:21 PM. If you have any questions, ask your nurse or doctor.          albuterol 108 (90 Base) MCG/ACT inhaler Commonly known as: VENTOLIN HFA Inhale 2 puffs by mouth 4 times daily as needed What changed: See the new instructions. Changed by: Worthy Rancher, MD   Arnell Sieving [phone] MCG/ACT Aero Generic drug: Budeson-Glycopyrrol-Formoterol Inhale 2 puffs into the lungs 2 (two) times daily.   busPIRone 5 MG tablet Commonly known as: BUSPAR Take 1 tablet (5  mg total) by mouth 3 (three) times daily as needed.   Calcium Citrate-Vitamin D 315-250 MG-UNIT Tabs Commonly known as: Calcium Citrate + D Take 2 tablets by mouth daily.   diclofenac Sodium 1 % Gel Commonly known as: VOLTAREN APPLY 2 GRAMS TO AFFECTED AREA 4 TIMES A DAY   diltiazem 300 MG 24 hr capsule Commonly known as: CARDIZEM CD TAKE 1 CAPSULE BY MOUTH EVERY DAY What changed:  how much to take how to take this when to take this additional  instructions Changed by: Fransisca Kaufmann Quill Grinder, MD   Eliquis 5 MG Tabs tablet Generic drug: apixaban TAKE 1 TABLET BY MOUTH TWICE A DAY   esomeprazole 40 MG capsule Commonly known as: NEXIUM Take 1 capsule (40 mg total) by mouth daily. What changed: how much to take Changed by: Worthy Rancher, MD   famotidine 20 MG tablet Commonly known as: Pepcid One after supper   ferrous sulfate 325 (65 FE) MG tablet Take 1 tablet (325 mg total) by mouth at bedtime.   furosemide 20 MG tablet Commonly known as: LASIX Take 0.5 tablets (10 mg total) by mouth daily.   hydrOXYzine 25 MG capsule Commonly known as: VISTARIL Take 1 capsule (25 mg total) by mouth at bedtime as needed.   ipratropium-albuterol 0.5-2.5 (3) MG/3ML Soln Commonly known as: DUONEB Take 3 mLs by nebulization every 6 (six) hours as needed. Dx:  Asthma 493.90   metoprolol tartrate 25 MG tablet Commonly known as: LOPRESSOR Take 1 tablet (25 mg total) by mouth daily as needed.   montelukast 10 MG tablet Commonly known as: SINGULAIR TAKE 1 TABLET BY MOUTH EVERYDAY AT BEDTIME   nitroGLYCERIN 0.4 MG/SPRAY spray Commonly known as: NITROLINGUAL ONE SPRAY UNDER TONGUE AS NEED FOR CHESTPAIN. MAY REPEAT IN 5 MINUTES IF PAIN NOT RELIEVED CALL 911.   potassium chloride SA 20 MEQ tablet Commonly known as: [place]-Con M20 Take 1 tablet (20 mEq total) by mouth daily.   PRESERVISION/LUTEIN PO Take 1 capsule by mouth at bedtime.   rosuvastatin 20 MG tablet Commonly known as: CRESTOR TAKE 1 TABLET BY MOUTH EVERY DAY   sertraline 50 MG tablet Commonly known as: ZOLOFT Take 1 tablet (50 mg total) by mouth daily.   Systane 0.4-0.3 % Soln Generic drug: Polyethyl Glycol-Propyl Glycol Place 1 drop into both eyes 2 (two) times daily.   Vitamin D3 125 MCG (5000 UT) Caps Take 5,000 Units by mouth daily.         Objective:   BP 108/65   Pulse 71   Ht '5\' 3"'$  (1.6 m)   Wt 158 lb (71.7 kg)   SpO2 94%   BMI 27.99 kg/m    Wt Readings from Last 3 Encounters:  09/05/22 158 lb (71.7 kg)  08/13/22 170 lb (77.1 kg)  07/24/22 171 lb (77.6 kg)    Physical Exam Vitals and nursing note reviewed.  Constitutional:      General: She is not in acute distress.    Appearance: She is well-developed. She is not diaphoretic.  Eyes:     Conjunctiva/sclera: Conjunctivae normal.  Cardiovascular:     Rate and Rhythm: Normal rate and regular rhythm.     Heart sounds: Normal heart sounds. No murmur heard. Pulmonary:     Effort: Pulmonary effort is normal. No respiratory distress.     Breath sounds: Normal breath sounds. No wheezing.  Musculoskeletal:        General: No swelling or tenderness. Normal range of motion.  Skin:  General: Skin is warm and dry.     Findings: No rash.  Neurological:     Mental Status: She is alert and oriented to person, place, and time.     Coordination: Coordination normal.  Psychiatric:        Behavior: Behavior normal.       Assessment & Plan:   Problem List Items Addressed This Visit       Cardiovascular and Mediastinum   CAD (coronary artery disease)   Relevant Medications   diltiazem (CARDIZEM CD) 300 MG 24 hr capsule   furosemide (LASIX) 20 MG tablet   metoprolol tartrate (LOPRESSOR) 25 MG tablet   Other Relevant Orders   CBC with Differential/Platelet   PAF (paroxysmal atrial fibrillation) (HCC)   Relevant Medications   diltiazem (CARDIZEM CD) 300 MG 24 hr capsule   furosemide (LASIX) 20 MG tablet   metoprolol tartrate (LOPRESSOR) 25 MG tablet   Other Relevant Orders   CBC with Differential/Platelet     Respiratory   Asthma   Relevant Medications   albuterol (VENTOLIN HFA) 108 (90 Base) MCG/ACT inhaler   ipratropium-albuterol (DUONEB) 0.5-2.5 (3) MG/3ML SOLN   montelukast (SINGULAIR) 10 MG tablet     Other   Mixed hyperlipidemia - Primary   Relevant Medications   diltiazem (CARDIZEM CD) 300 MG 24 hr capsule   furosemide (LASIX) 20 MG tablet   metoprolol  tartrate (LOPRESSOR) 25 MG tablet   Other Relevant Orders   CBC with Differential/Platelet   CMP14+EGFR   Lipid panel   Anxiety state   Relevant Medications   busPIRone (BUSPAR) 5 MG tablet   sertraline (ZOLOFT) 50 MG tablet   hydrOXYzine (VISTARIL) 25 MG capsule   Depression, recurrent (HCC)   Relevant Medications   busPIRone (BUSPAR) 5 MG tablet   sertraline (ZOLOFT) 50 MG tablet   hydrOXYzine (VISTARIL) 25 MG capsule   Other Relevant Orders   CBC with Differential/Platelet   Other Visit Diagnoses     Peripheral edema       Relevant Medications   furosemide (LASIX) 20 MG tablet   metoprolol tartrate (LOPRESSOR) 25 MG tablet       Continue current medicine, no changes.  Will do blood work today. Follow up plan: Return in about 6 months (around 03/06/2023), or if symptoms worsen or fail to improve, for Hyperlipidemia anxiety depression recheck.  Counseling provided for all of the vaccine components Orders Placed This Encounter  Procedures   CBC with Differential/Platelet   CMP14+EGFR   Lipid panel    Caryl Pina, MD [facility] 09/05/2022, 1:21 PM

## 2022-09-06 ENCOUNTER — Other Ambulatory Visit: Payer: Self-pay | Admitting: Internal Medicine

## 2022-09-06 ENCOUNTER — Other Ambulatory Visit: Payer: Self-pay | Admitting: Family Medicine

## 2022-09-06 DIAGNOSIS — I480 Paroxysmal atrial fibrillation: Secondary | ICD-10-CM

## 2022-09-06 DIAGNOSIS — R609 Edema, unspecified: Secondary | ICD-10-CM

## 2022-09-06 LAB — CBC WITH DIFFERENTIAL/PLATELET
Basophils Absolute: 0 10*3/uL (ref 0.0–0.2)
Basos: 1 %
EOS (ABSOLUTE): 0.2 10*3/uL (ref 0.0–0.4)
Eos: 3 %
Hematocrit: 44.2 % (ref 34.0–46.6)
Hemoglobin: 14.5 g/dL (ref 11.1–15.9)
Immature Grans (Abs): 0 10*3/uL (ref 0.0–0.1)
Immature Granulocytes: 0 %
Lymphocytes Absolute: 1.9 10*3/uL (ref 0.7–3.1)
Lymphs: 30 %
MCH: 29.2 pg (ref 26.6–33.0)
MCHC: 32.8 g/dL (ref 31.5–35.7)
MCV: 89 fL (ref 79–97)
Monocytes Absolute: 0.6 10*3/uL (ref 0.1–0.9)
Monocytes: 9 %
Neutrophils Absolute: 3.7 10*3/uL (ref 1.4–7.0)
Neutrophils: 57 %
Platelets: 157 10*3/uL (ref 150–450)
RBC: 4.96 x10E6/uL (ref 3.77–5.28)
RDW: 12.2 % (ref 11.7–15.4)
WBC: 6.4 10*3/uL (ref 3.4–10.8)

## 2022-09-06 LAB — CMP14+EGFR
ALT: 39 IU/L — ABNORMAL HIGH (ref 0–32)
AST: 39 IU/L (ref 0–40)
Albumin/Globulin Ratio: 2.3 — ABNORMAL HIGH (ref 1.2–2.2)
Albumin: 4.3 g/dL (ref 3.7–4.7)
Alkaline Phosphatase: 74 IU/L (ref 44–121)
BUN/Creatinine Ratio: 26 (ref 12–28)
BUN: 22 mg/dL (ref 8–27)
Bilirubin Total: 0.4 mg/dL (ref 0.0–1.2)
CO2: 26 mmol/L (ref 20–29)
Calcium: 9.6 mg/dL (ref 8.7–10.3)
Chloride: 103 mmol/L (ref 96–106)
Creatinine, Ser: 0.85 mg/dL (ref 0.57–1.00)
Globulin, Total: 1.9 g/dL (ref 1.5–4.5)
Glucose: 101 mg/dL — ABNORMAL HIGH (ref 70–99)
Potassium: 5.1 mmol/L (ref 3.5–5.2)
Sodium: 145 mmol/L — ABNORMAL HIGH (ref 134–144)
Total Protein: 6.2 g/dL (ref 6.0–8.5)
eGFR: 68 mL/min/{1.73_m2} (ref >59)

## 2022-09-06 LAB — LIPID PANEL
Chol/HDL Ratio: 2.4 ratio (ref 0.0–4.4)
Cholesterol, Total: 144 mg/dL (ref 100–199)
HDL: 61 mg/dL (ref >39)
LDL Chol Calc (NIH): 63 mg/dL (ref 0–99)
Triglycerides: 111 mg/dL (ref 0–149)
VLDL Cholesterol Cal: 20 mg/dL (ref 5–40)

## 2022-10-27 ENCOUNTER — Other Ambulatory Visit: Payer: Self-pay | Admitting: Family Medicine

## 2022-10-27 DIAGNOSIS — I480 Paroxysmal atrial fibrillation: Secondary | ICD-10-CM

## 2022-10-27 DIAGNOSIS — R609 Edema, unspecified: Secondary | ICD-10-CM

## 2022-12-01 DIAGNOSIS — H353133 Nonexudative age-related macular degeneration, bilateral, advanced atrophic without subfoveal involvement: Secondary | ICD-10-CM | POA: Diagnosis not present

## 2022-12-01 DIAGNOSIS — H43813 Vitreous degeneration, bilateral: Secondary | ICD-10-CM | POA: Diagnosis not present

## 2022-12-01 DIAGNOSIS — H43393 Other vitreous opacities, bilateral: Secondary | ICD-10-CM | POA: Diagnosis not present

## 2022-12-01 DIAGNOSIS — H353211 Exudative age-related macular degeneration, right eye, with active choroidal neovascularization: Secondary | ICD-10-CM | POA: Diagnosis not present

## 2022-12-01 DIAGNOSIS — H35033 Hypertensive retinopathy, bilateral: Secondary | ICD-10-CM | POA: Diagnosis not present

## 2022-12-01 DIAGNOSIS — H35373 Puckering of macula, bilateral: Secondary | ICD-10-CM | POA: Diagnosis not present

## 2022-12-07 ENCOUNTER — Other Ambulatory Visit: Payer: Self-pay | Admitting: Cardiology

## 2022-12-07 ENCOUNTER — Other Ambulatory Visit: Payer: Self-pay | Admitting: Family Medicine

## 2022-12-07 DIAGNOSIS — I480 Paroxysmal atrial fibrillation: Secondary | ICD-10-CM

## 2022-12-07 DIAGNOSIS — R600 Localized edema: Secondary | ICD-10-CM

## 2022-12-08 NOTE — Telephone Encounter (Signed)
Pt last saw Dr Antoine Poche 05/14/22, last labs 09/05/22 Creat 0.85, age 82, weight 71.7kg, based on specified criteria pt is on appropriate dosage of Eliquis 5mg  BID for afib.  Will refill rx.

## 2022-12-10 ENCOUNTER — Other Ambulatory Visit: Payer: Self-pay | Admitting: Family Medicine

## 2022-12-10 DIAGNOSIS — R600 Localized edema: Secondary | ICD-10-CM

## 2023-01-21 ENCOUNTER — Other Ambulatory Visit: Payer: Self-pay | Admitting: Family Medicine

## 2023-01-21 DIAGNOSIS — R600 Localized edema: Secondary | ICD-10-CM

## 2023-03-06 ENCOUNTER — Ambulatory Visit: Payer: Medicare HMO | Admitting: Family Medicine

## 2023-03-10 ENCOUNTER — Encounter: Payer: Self-pay | Admitting: Nurse Practitioner

## 2023-03-10 ENCOUNTER — Ambulatory Visit (INDEPENDENT_AMBULATORY_CARE_PROVIDER_SITE_OTHER): Payer: Medicare HMO | Admitting: Nurse Practitioner

## 2023-03-10 ENCOUNTER — Ambulatory Visit (INDEPENDENT_AMBULATORY_CARE_PROVIDER_SITE_OTHER): Payer: Medicare HMO

## 2023-03-10 VITALS — BP 109/58 | HR 68 | Temp 97.6°F | Ht 63.0 in

## 2023-03-10 DIAGNOSIS — I517 Cardiomegaly: Secondary | ICD-10-CM | POA: Diagnosis not present

## 2023-03-10 DIAGNOSIS — K449 Diaphragmatic hernia without obstruction or gangrene: Secondary | ICD-10-CM | POA: Diagnosis not present

## 2023-03-10 DIAGNOSIS — J90 Pleural effusion, not elsewhere classified: Secondary | ICD-10-CM | POA: Diagnosis not present

## 2023-03-10 DIAGNOSIS — J40 Bronchitis, not specified as acute or chronic: Secondary | ICD-10-CM | POA: Insufficient documentation

## 2023-03-10 DIAGNOSIS — R0602 Shortness of breath: Secondary | ICD-10-CM | POA: Insufficient documentation

## 2023-03-10 MED ORDER — AZITHROMYCIN 250 MG PO TABS
ORAL_TABLET | ORAL | 0 refills | Status: DC
Start: 2023-03-10 — End: 2023-03-16

## 2023-03-10 MED ORDER — FLUTICASONE PROPIONATE 50 MCG/ACT NA SUSP
2.0000 | Freq: Every day | NASAL | 6 refills | Status: DC
Start: 2023-03-10 — End: 2023-08-16

## 2023-03-10 NOTE — Progress Notes (Signed)
Established Patient Office Visit  Subjective   Patient ID: Krystal [name], female    DOB: 1941/06/26  Age: 82 y.o. MRN: [id number]  Chief Complaint  Patient presents with   Shortness of Breath   Cough    Has been feeling bad since friday    HPI Krystal [name] is a 82 y.o. female who complains of congestion, SOB, wheezing, nasal blockage, post nasal drip, and dry cough for 6 days. She denies a history of anorexia, chest pain, dizziness, nausea, and vomiting. She admits to a history of asthma. Patient does not smoke cigarettes.    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   PAF (paroxysmal atrial fibrillation) (HCC) 03/28/2021   Unstable angina (HCC)    CAD (coronary artery disease) 03/20/2021   DOE (dyspnea on exertion) 05/17/2019   Osteoporosis 03/24/2019   Abdominal aortic atherosclerosis (HCC) 04/10/2015   PSVT (paroxysmal supraventricular tachycardia) 05/10/2014   Macular degeneration, age related, nonexudative 08/24/2013   History of right mastectomy 07/13/2013   Anemia, iron deficiency 06/21/2013   Allergic rhinitis 06/21/2013   Chronic low back pain 06/21/2013   Malignant neoplasm of female breast (HCC) 11/22/2007   Mixed hyperlipidemia 11/22/2007   Anxiety state 11/22/2007   Depression, recurrent (HCC) 11/22/2007   Asthma 11/22/2007   GERD 11/22/2007   Diaphragmatic hernia 08/14/2007   Hiatal hernia 08/14/2007   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   Past Medical History:  Diagnosis Date   Anemia    Asthma    Brain tumor (HCC)    1986   Breast cancer (HCC)    Right breast   Cataract    Colon polyps    COPD (chronic obstructive pulmonary disease) (HCC)    Hiatal  hernia    Hyperlipidemia    Macular degeneration    Osteoporosis    Pneumonia    PVD (posterior vitreous detachment)    Right knee pain    Shingles rash    Past Surgical History:  Procedure Laterality Date   CATARACT EXTRACTION, BILATERAL     COLONOSCOPY     CORONARY PRESSURE/FFR WITH 3D MAPPING N/A 03/21/2021   Procedure: Coronary Pressure Wire/FFR w/3D Mapping;  Surgeon: Yvonne Kendall, MD;  Location: [facility];  Service: Cardiovascular;  Laterality: N/A;   CRANIECTOMY / CRANIOTOMY FOR EXCISION OF BRAIN TUMOR     Left side brain   LEFT HEART CATH AND CORONARY ANGIOGRAPHY N/A 03/21/2021   Procedure: LEFT HEART CATH AND CORONARY ANGIOGRAPHY;  Surgeon: Yvonne Kendall, MD;  Location: [facility];  Service: Cardiovascular;  Laterality: N/A;   MASTECTOMY Right    PARTIAL HYSTERECTOMY     POLYPECTOMY     SKIN CANCER EXCISION Left    Under left eye   Social History   Tobacco Use   Smoking status: Former    Current packs/day: 0.00    Types: Cigarettes    Quit date: 02/09/1961    Years since quitting: 62.1   Smokeless tobacco: Never  Vaping Use   Vaping status: Never Used  Substance Use Topics   Alcohol use: No    Alcohol/week: 0.0 standard drinks of alcohol   Drug use: No   Social History   Socioeconomic  History   Marital status: Widowed    Spouse name: Not on file   Number of children: 1   Years of education: 7th   Highest education level: 7th grade  Occupational History   Occupation: retired  Tobacco Use   Smoking status: Former    Current packs/day: 0.00    Types: Cigarettes    Quit date: 02/09/1961    Years since quitting: 62.1   Smokeless tobacco: Never  Vaping Use   Vaping status: Never Used  Substance and Sexual Activity   Alcohol use: No    Alcohol/week: 0.0 standard drinks of alcohol   Drug use: No   Sexual activity: Not Currently  Other Topics Concern   Not on file  Social History Narrative   Son lives with patient.   Retired from  farming.   Highest level of education:  7th   Social Determinants of Health   Financial Resource Strain: Low Risk  (08/13/2022)   Overall Financial Resource Strain (CARDIA)    Difficulty of Paying Living Expenses: Not hard at all  Food Insecurity: No Food Insecurity (08/13/2022)   Hunger Vital Sign    Worried About Running Out of Food in the Last Year: Never true    Ran Out of Food in the Last Year: Never true  Transportation Needs: No Transportation Needs (08/13/2022)   PRAPARE - Administrator, Civil Service (Medical): No    Lack of Transportation (Non-Medical): No  Physical Activity: Sufficiently Active (08/13/2022)   Exercise Vital Sign    Days of Exercise per Week: 5 days    Minutes of Exercise per Session: 30 min  Stress: No Stress Concern Present (08/13/2022)   [employer] of Occupational Health - Occupational Stress Questionnaire    Feeling of Stress : Not at all  Social Connections: Socially Isolated (08/13/2022)   Social Connection and Isolation Panel [NHANES]    Frequency of Communication with Friends and Family: More than three times a week    Frequency of Social Gatherings with Friends and Family: Three times a week    Attends Religious Services: Never    Active Member of Clubs or Organizations: No    Attends Banker Meetings: Never    Marital Status: Widowed  Intimate Partner Violence: Not At Risk (08/13/2022)   Humiliation, Afraid, Rape, and Kick questionnaire    Fear of Current or Ex-Partner: No    Emotionally Abused: No    Physically Abused: No    Sexually Abused: No   Family Status  Relation Name Status   Mother  Deceased at age 84   Sister  Alive   Brother  Alive   Sister  Alive   Sister  Alive   Sister  Alive   Brother  Alive   Father  Deceased at age 52's   Brother  Deceased   Brother  Alive   Brother  Deceased   Brother  Deceased   Son  Alive   Neg Hx  (Not Specified)  No partnership data on file   Family History   Problem Relation Age of Onset   Heart disease Mother 35       Stroke    Dementia Mother    Congestive Heart Failure Mother    Osteoporosis Mother    Stroke Mother    Heart disease Sister    Hypertension Sister    Diabetes Sister    Osteoporosis Sister    Heart disease Brother    Hyperlipidemia Brother  Diabetes Brother    Heart disease Sister    Hypertension Sister    Diabetes Sister    Diabetes Sister    Hypertension Sister    Diabetes Sister    Hypertension Sister    Diabetes Brother    Hyperlipidemia Brother    Cancer Father        lung   Heart attack Father    Diabetes Brother    Heart disease Brother    Hyperlipidemia Brother    Diabetes Brother    Diabetes Son    Colon cancer Neg Hx    Rectal cancer Neg Hx    Stomach cancer Neg Hx    Esophageal cancer Neg Hx    Allergies  Allergen Reactions   Actonel [Risedronate] Nausea And Vomiting and Other (See Comments)    Throat tightness   Fosamax [Alendronate Sodium] Other (See Comments)    esophagitis   Mevacor [Lovastatin] Other (See Comments)    myalgias   Miacalcin [Calcitonin (Salmon)] Nausea And Vomiting   Mobic [Meloxicam] Hives and Itching    rash      Review of Systems  Constitutional:  Negative for chills and fever.  Respiratory:  Positive for cough, shortness of breath and wheezing.   Cardiovascular:  Negative for chest pain and leg swelling.  Gastrointestinal:  Negative for blood in stool, nausea and vomiting.  Musculoskeletal:  Negative for falls and neck pain.  Skin:  Negative for itching and rash.  Neurological:  Negative for dizziness and headaches.   Negative unless indicated in HPI   Objective:     BP (!) 109/58   Pulse 68   Temp 97.6 F (36.4 C) (Temporal)   Ht 5\' 3"  (1.6 m)   SpO2 92%   BMI 27.99 kg/m  BP Readings from Last 3 Encounters:  03/10/23 (!) 109/58  09/05/22 108/65  07/24/22 119/64   Wt Readings from Last 3 Encounters:  09/05/22 158 lb (71.7 kg)  08/13/22  170 lb (77.1 kg)  07/24/22 171 lb (77.6 kg)      Physical Exam Vitals and nursing note reviewed.  Constitutional:      Appearance: She is well-developed. She is obese.  HENT:     Head: Normocephalic and atraumatic.     Nose: Congestion and rhinorrhea present.  Eyes:     Extraocular Movements: Extraocular movements intact.     Pupils: Pupils are equal, round, and reactive to light.  Cardiovascular:     Rate and Rhythm: Normal rate and regular rhythm.  Pulmonary:     Breath sounds: Wheezing present.  Skin:    General: Skin is warm and dry.     Findings: No rash.  Neurological:     Mental Status: She is alert and oriented to person, place, and time. Mental status is at baseline.  Psychiatric:        Mood and Affect: Mood normal.        Behavior: Behavior normal.        Judgment: Judgment normal.     No results found for any visits on 03/10/23.  Last CBC Lab Results  Component Value Date   WBC 6.4 09/05/2022   HGB 14.5 09/05/2022   HCT 44.2 09/05/2022   MCV 89 09/05/2022   MCH 29.2 09/05/2022   RDW 12.2 09/05/2022   PLT 157 09/05/2022   Last metabolic panel Lab Results  Component Value Date   GLUCOSE 101 (H) 09/05/2022   NA 145 (H) 09/05/2022   K 5.1 09/05/2022  CL 103 09/05/2022   CO2 26 09/05/2022   BUN 22 09/05/2022   CREATININE 0.85 09/05/2022   EGFR 68 09/05/2022   CALCIUM 9.6 09/05/2022   PROT 6.2 09/05/2022   ALBUMIN 4.3 09/05/2022   LABGLOB 1.9 09/05/2022   AGRATIO 2.3 (H) 09/05/2022   BILITOT 0.4 09/05/2022   ALKPHOS 74 09/05/2022   AST 39 09/05/2022   ALT 39 (H) 09/05/2022   ANIONGAP 7 01/31/2022   Last lipids Lab Results  Component Value Date   CHOL 144 09/05/2022   HDL 61 09/05/2022   LDLCALC 63 09/05/2022   TRIG 111 09/05/2022   CHOLHDL 2.4 09/05/2022   Last hemoglobin A1c Lab Results  Component Value Date   HGBA1C 6.2 (H) 09/04/2021   Last thyroid functions Lab Results  Component Value Date   TSH 2.420 10/14/2021   T4TOTAL  6.0 10/18/2014        Assessment & Plan:  Shortness of breath -     DG Chest 2 View -     Fluticasone Propionate; Place 2 sprays into both nostrils daily.  Dispense: 16 g; Refill: 6 -     Azithromycin; Take 2 tabs today, then 1-tab until done  Dispense: 6 each; Refill: 0  Bronchitis -     Fluticasone Propionate; Place 2 sprays into both nostrils daily.  Dispense: 16 g; Refill: 6 -     Azithromycin; Take 2 tabs today, then 1-tab until done  Dispense: 6 each; Refill: 0   ASSESSMENT:  Krystal [name] was seen today for SOB, cough,bronchitis, no acute distress Z-pack  Duoneb treatment administered at the office X-ray awaiting final reports from radiologist Increase hydration, rest, return office visit prn if symptoms persist or worsen, and . Call or return to clinic prn if these symptoms worsen or fail to improve as  Return for as already scheduled with PCP.    Arrie Aran Santa Lighter, DNP [facility] [address] [place], [place] [address] [phone]

## 2023-03-12 ENCOUNTER — Other Ambulatory Visit: Payer: Self-pay | Admitting: Family Medicine

## 2023-03-12 DIAGNOSIS — R600 Localized edema: Secondary | ICD-10-CM

## 2023-03-13 ENCOUNTER — Emergency Department (HOSPITAL_COMMUNITY): Payer: Medicare HMO

## 2023-03-13 ENCOUNTER — Emergency Department (HOSPITAL_COMMUNITY)
Admission: EM | Admit: 2023-03-13 | Discharge: 2023-03-13 | Disposition: A | Discharge status: Home / Self Care | Payer: Medicare HMO | Attending: Emergency Medicine | Admitting: Emergency Medicine

## 2023-03-13 ENCOUNTER — Encounter (HOSPITAL_COMMUNITY): Payer: Self-pay | Admitting: *Deleted

## 2023-03-13 ENCOUNTER — Other Ambulatory Visit: Payer: Self-pay

## 2023-03-13 DIAGNOSIS — I509 Heart failure, unspecified: Secondary | ICD-10-CM | POA: Insufficient documentation

## 2023-03-13 DIAGNOSIS — R079 Chest pain, unspecified: Secondary | ICD-10-CM | POA: Diagnosis not present

## 2023-03-13 DIAGNOSIS — Z1152 Encounter for screening for COVID-19: Secondary | ICD-10-CM | POA: Insufficient documentation

## 2023-03-13 DIAGNOSIS — Z853 Personal history of malignant neoplasm of breast: Secondary | ICD-10-CM | POA: Insufficient documentation

## 2023-03-13 DIAGNOSIS — Z7901 Long term (current) use of anticoagulants: Secondary | ICD-10-CM | POA: Insufficient documentation

## 2023-03-13 DIAGNOSIS — J90 Pleural effusion, not elsewhere classified: Secondary | ICD-10-CM | POA: Diagnosis not present

## 2023-03-13 DIAGNOSIS — I480 Paroxysmal atrial fibrillation: Secondary | ICD-10-CM | POA: Diagnosis not present

## 2023-03-13 DIAGNOSIS — R0602 Shortness of breath: Secondary | ICD-10-CM | POA: Insufficient documentation

## 2023-03-13 DIAGNOSIS — I110 Hypertensive heart disease with heart failure: Secondary | ICD-10-CM | POA: Diagnosis not present

## 2023-03-13 DIAGNOSIS — R600 Localized edema: Secondary | ICD-10-CM | POA: Diagnosis not present

## 2023-03-13 DIAGNOSIS — R918 Other nonspecific abnormal finding of lung field: Secondary | ICD-10-CM | POA: Diagnosis not present

## 2023-03-13 DIAGNOSIS — K449 Diaphragmatic hernia without obstruction or gangrene: Secondary | ICD-10-CM | POA: Diagnosis not present

## 2023-03-13 LAB — BASIC METABOLIC PANEL
Anion gap: 8 (ref 5–15)
BUN: 19 mg/dL (ref 8–23)
CO2: 27 mmol/L (ref 22–32)
Calcium: 9.2 mg/dL (ref 8.9–10.3)
Chloride: 104 mmol/L (ref 98–111)
Creatinine, Ser: 0.79 mg/dL (ref 0.44–1.00)
GFR, Estimated: >60 mL/min (ref >60)
Glucose, Bld: 158 mg/dL — ABNORMAL HIGH (ref 70–99)
Potassium: 3.7 mmol/L (ref 3.5–5.1)
Sodium: 139 mmol/L (ref 135–145)

## 2023-03-13 LAB — CBC
HCT: 36.1 % (ref 36.0–46.0)
Hemoglobin: 11.4 g/dL — ABNORMAL LOW (ref 12.0–15.0)
MCH: 29.2 pg (ref 26.0–34.0)
MCHC: 31.6 g/dL (ref 30.0–36.0)
MCV: 92.3 fL (ref 80.0–100.0)
Platelets: 145 10*3/uL — ABNORMAL LOW (ref 150–400)
RBC: 3.91 MIL/uL (ref 3.87–5.11)
RDW: 13.4 % (ref 11.5–15.5)
WBC: 5.3 10*3/uL (ref 4.0–10.5)
nRBC: 0 % (ref 0.0–0.2)

## 2023-03-13 LAB — TROPONIN I (HIGH SENSITIVITY)
Troponin I (High Sensitivity): 6 ng/L (ref <18)
Troponin I (High Sensitivity): 7 ng/L (ref <18)

## 2023-03-13 LAB — SARS CORONAVIRUS 2 BY RT PCR: SARS Coronavirus 2 by RT PCR: NEGATIVE

## 2023-03-13 LAB — BRAIN NATRIURETIC PEPTIDE: B Natriuretic Peptide: 368 pg/mL — ABNORMAL HIGH (ref 0.0–100.0)

## 2023-03-13 NOTE — ED Triage Notes (Signed)
Pt with swelling to right hand and bilateral legs since Sunday.  + NP cough since Monday.  Chills at times. SOB earlier this week, denies at present.

## 2023-03-13 NOTE — ED Provider Notes (Signed)
[place] EMERGENCY DEPARTMENT AT [facility] Provider Note   CSN: [id number] Arrival date & time: 03/13/23  1739     History {Add pertinent medical, surgical, social history, OB history to HPI:1} Chief Complaint  Patient presents with   Leg Swelling    Krystal [name] is a 82 y.o. female.  Monday R mastectomy On eliquis for af        Home Medications Prior to Admission medications   Medication Sig Start Date End Date Taking? Authorizing Provider  albuterol (VENTOLIN HFA) 108 (90 Base) MCG/ACT inhaler Inhale 2 puffs by mouth 4 times daily as needed 09/05/22   Dettinger, Elige Radon, MD  apixaban (ELIQUIS) 5 MG TABS tablet TAKE 1 TABLET BY MOUTH TWICE A DAY 12/08/22   Rollene Rotunda, MD  azithromycin (ZITHROMAX Z-PAK) 250 MG tablet Take 2 tabs today, then 1-tab until done 03/10/23   Martina Sinner, NP  Budeson-Glycopyrrol-Formoterol (BREZTRI AEROSPHERE) 160-9-4.8 MCG/ACT AERO Inhale 2 puffs into the lungs 2 (two) times daily. 02/21/22   Nyoka Cowden, MD  busPIRone (BUSPAR) 5 MG tablet Take 1 tablet (5 mg total) by mouth 3 (three) times daily as needed. 09/05/22   Dettinger, Elige Radon, MD  Calcium Citrate-Vitamin D (CALCIUM CITRATE + D) 315-250 MG-UNIT TABS Take 2 tablets by mouth daily. 12/10/15   Eckard, Tammy, RPH-CPP  Cholecalciferol (VITAMIN D3) 125 MCG (5000 UT) CAPS Take 5,000 Units by mouth daily.    [provider]  diclofenac Sodium (VOLTAREN) 1 % GEL APPLY 2 GRAMS TO AFFECTED AREA 4 TIMES A DAY 01/21/23   Dettinger, Elige Radon, MD  diltiazem (CARDIZEM CD) 300 MG 24 hr capsule TAKE 1 CAPSULE BY MOUTH EVERY DAY 09/05/22   Dettinger, Elige Radon, MD  esomeprazole (NEXIUM) 40 MG capsule Take 1 capsule (40 mg total) by mouth daily. 09/05/22   Dettinger, Elige Radon, MD  famotidine (PEPCID) 20 MG tablet TAKE ONE TABLET DAILY AFTER SUPPER 09/08/22   Nyoka Cowden, MD  ferrous sulfate 325 (65 FE) MG tablet TAKE 1 TABLET BY MOUTH AT BEDTIME 03/12/23   Dettinger, Elige Radon, MD  fluticasone (FLONASE) 50 MCG/ACT nasal spray Place 2 sprays into both nostrils daily. 03/10/23   St Vena Austria, NP  furosemide (LASIX) 20 MG tablet Take 0.5 tablets (10 mg total) by mouth daily. 09/05/22   Dettinger, Elige Radon, MD  hydrOXYzine (VISTARIL) 25 MG capsule Take 1 capsule (25 mg total) by mouth at bedtime as needed. 09/05/22   Dettinger, Elige Radon, MD  ipratropium-albuterol (DUONEB) 0.5-2.5 (3) MG/3ML SOLN Take 3 mLs by nebulization every 6 (six) hours as needed. Dx:  Asthma 493.90 09/05/22   Dettinger, Elige Radon, MD  metoprolol tartrate (LOPRESSOR) 25 MG tablet TAKE 1 TABLET BY MOUTH TWICE A DAY 10/27/22   Dettinger, Elige Radon, MD  montelukast (SINGULAIR) 10 MG tablet TAKE 1 TABLET BY MOUTH EVERYDAY AT BEDTIME 09/05/22   Dettinger, Elige Radon, MD  Multiple Vitamins-Minerals (PRESERVISION/LUTEIN PO) Take 1 capsule by mouth at bedtime.     [provider]  nitroGLYCERIN (NITROLINGUAL) 0.4 MG/SPRAY spray ONE SPRAY UNDER TONGUE AS NEED FOR CHESTPAIN. MAY REPEAT IN 5 MINUTES IF PAIN NOT RELIEVED CALL 911. 08/22/21   Rollene Rotunda, MD  Polyethyl Glycol-Propyl Glycol (SYSTANE) 0.4-0.3 % SOLN Place 1 drop into both eyes 2 (two) times daily.    [provider]  potassium chloride SA (KLOR-CON M20) 20 MEQ tablet Take 1 tablet (20 mEq total) by mouth daily. 03/13/22  Dettinger, Elige Radon, MD  rosuvastatin (CRESTOR) 20 MG tablet TAKE 1 TABLET BY MOUTH EVERY DAY 12/08/22   Rollene Rotunda, MD  sertraline (ZOLOFT) 50 MG tablet Take 1 tablet (50 mg total) by mouth daily. 09/05/22   Dettinger, Elige Radon, MD      Allergies    Actonel [risedronate], Fosamax [alendronate sodium], Mevacor [lovastatin], Miacalcin [calcitonin (salmon)], and Mobic [meloxicam]    Review of Systems   Review of Systems  Physical Exam Updated Vital Signs BP (!) 112/59 (BP Location: Right Arm)   Pulse 80   Temp 98.2 F (36.8 C) (Oral)   Resp 20   Ht 5\' 3"  (1.6 m)   Wt 78.3 kg   SpO2 95%   BMI 30.59  kg/m  Physical Exam  ED Results / Procedures / Treatments   Labs (all labs ordered are listed, but only abnormal results are displayed) Labs Reviewed  BASIC METABOLIC PANEL - Abnormal; Notable for the following components:      Result Value   Glucose, Bld 158 (*)    All other components within normal limits  CBC - Abnormal; Notable for the following components:   Hemoglobin 11.4 (*)    Platelets 145 (*)    All other components within normal limits  SARS CORONAVIRUS 2 BY RT PCR  TROPONIN I (HIGH SENSITIVITY)  TROPONIN I (HIGH SENSITIVITY)    EKG None  Radiology DG Chest 2 View  Result Date: 03/13/2023 CLINICAL DATA:  Chest pain EXAM: CHEST - 2 VIEW COMPARISON:  03/10/2023 and older x-ray FINDINGS: Enlarged cardiopericardial silhouette with calcified aorta. Film is rotated to the left. Small bilateral pleural effusions with adjacent opacities. No pneumothorax. Large hiatal hernia. Degenerative changes of the spine with osteopenia. IMPRESSION: Large hiatal hernia. Small bilateral pleural effusions with the adjacent lung opacities. Recommend follow-up. Electronically Signed   By: Karen Kays M.D.   On: 03/13/2023 18:22    Procedures Procedures  {Document cardiac monitor, telemetry assessment procedure when appropriate:1}  Medications Ordered in ED Medications - No data to display  ED Course/ Medical Decision Making/ A&P   {   Click here for ABCD2, HEART and other calculatorsREFRESH Note before signing :1}                              Medical Decision Making Amount and/or Complexity of Data Reviewed Labs: ordered. Radiology: ordered.   ***  {Document critical care time when appropriate:1} {Document review of labs and clinical decision tools ie heart score, Chads2Vasc2 etc:1}  {Document your independent review of radiology images, and any outside records:1} {Document your discussion with family members, caretakers, and with consultants:1} {Document social determinants  of health affecting pt's care:1} {Document your decision making why or why not admission, treatments were needed:1} Final Clinical Impression(s) / ED Diagnoses Final diagnoses:  None    Rx / DC Orders ED Discharge Orders     None

## 2023-03-13 NOTE — Discharge Instructions (Signed)
You were seen for your shortness of breath and swelling in the emergency department. It is likely from fluid buildup.  At home, please take your Lasix every day.  You may also take it twice a day with your potassium twice a day if you are having excessive swelling.  Check your MyChart online for the results of any tests that had not resulted by the time you left the emergency department.   Follow-up with your primary doctor in 2-3 days regarding your visit.  Cardiology will call you about a follow-up as well.  Return immediately to the emergency department if you experience any of the following: Difficulty breathing, chest pains, or any other concerning symptoms.    Thank you for visiting our Emergency Department. It was a pleasure taking care of you today.

## 2023-03-16 ENCOUNTER — Ambulatory Visit (INDEPENDENT_AMBULATORY_CARE_PROVIDER_SITE_OTHER): Payer: Medicare HMO | Admitting: Family Medicine

## 2023-03-16 ENCOUNTER — Encounter: Payer: Self-pay | Admitting: Family Medicine

## 2023-03-16 VITALS — BP 99/49 | HR 69 | Temp 98.2°F | Ht 63.0 in | Wt 166.0 lb

## 2023-03-16 DIAGNOSIS — R600 Localized edema: Secondary | ICD-10-CM | POA: Diagnosis not present

## 2023-03-16 DIAGNOSIS — R0602 Shortness of breath: Secondary | ICD-10-CM | POA: Diagnosis not present

## 2023-03-16 DIAGNOSIS — I5043 Acute on chronic combined systolic (congestive) and diastolic (congestive) heart failure: Secondary | ICD-10-CM | POA: Diagnosis not present

## 2023-03-16 DIAGNOSIS — I5042 Chronic combined systolic (congestive) and diastolic (congestive) heart failure: Secondary | ICD-10-CM | POA: Insufficient documentation

## 2023-03-16 NOTE — Progress Notes (Signed)
Established Patient Office Visit  Subjective   Patient ID: ANTWANIQUE JUNCKER, female    DOB: September 22, 1940  Age: 82 y.o. MRN: [id number]  Chief Complaint  Patient presents with   Congestive Heart Failure    Congestive Heart Failure   Caisey is here for an ER follow up. She presented to [facility] on 03/13/23 for peripheral edema, cough, and shortness of breath.   Per ER note assessment:  "Patient presents to the emergency department with lower extremity swelling, cough, shortness of breath, and congestion and runny nose. Does have bilateral lower extremity edema and swelling of her right upper extremity. Suspect that her right upper extremity may be swollen because of the mastectomy that she had on that side. Is on Eliquis so highly doubt DVT or PE. She had a BNP that was sent that was elevated along with a chest x-ray that did show bilateral pleural effusions. Her COVID was negative. Suspect that her symptoms are likely due to volume overload and heart failure exacerbation. She is already on Lasix every day and says that she has had some improvement so I counseled her to double her Eliquis dose for the next 2 to 3 days and to continue it daily and her swelling resolved. Also counseled to take her potassium supplementation every time she takes her Lasix pill."  She has been taking lasix 20 mg BID. She reports improved edema. Is no longer feeling short of breath. Denies chest pain, orthopnea. Mild cough. She is not able to weigh herself at home. Has not been checking BP at home. Referral to cardiology has been placed.     ROS AS per HPI.    Objective:     BP (!) 99/49   Pulse 69   Temp 98.2 F (36.8 C) (Temporal)   Ht 5\' 3"  (1.6 m)   Wt 166 lb (75.3 kg)   SpO2 95%   BMI 29.41 kg/m  BP Readings from Last 3 Encounters:  03/16/23 (!) 99/49  03/13/23 103/60  03/10/23 (!) 109/58   Wt Readings from Last 3 Encounters:  03/16/23 166 lb (75.3 kg)  03/13/23 172 lb 11.2 oz (78.3 kg)   09/05/22 158 lb (71.7 kg)      Physical Exam Vitals and nursing note reviewed.  Constitutional:      General: She is not in acute distress.    Appearance: She is not ill-appearing, toxic-appearing or diaphoretic.  HENT:     Head: Normocephalic and atraumatic.     Mouth/Throat:     Pharynx: Oropharynx is clear.  Cardiovascular:     Rate and Rhythm: Normal rate and regular rhythm.     Heart sounds: Normal heart sounds. No murmur heard. Pulmonary:     Effort: Pulmonary effort is normal. No respiratory distress.     Breath sounds: Normal breath sounds. No wheezing, rhonchi or rales.  Abdominal:     General: Bowel sounds are normal.     Palpations: Abdomen is soft.  Musculoskeletal:     Cervical back: Neck supple. No rigidity.     Right lower leg: No edema.     Left lower leg: No edema.  Skin:    General: Skin is warm and dry.  Neurological:     Mental Status: She is alert and oriented to person, place, and time. Mental status is at baseline.  Psychiatric:        Mood and Affect: Mood normal.        Behavior: Behavior normal.  No results found for any visits on 03/16/23.    The ASCVD Risk score (Arnett DK, et al., 2019) failed to calculate for the following reasons:   The 2019 ASCVD risk score is only valid for ages 33 to 44    Assessment & Plan:   Empryss was seen today for congestive heart failure.  Diagnoses and all orders for this visit:  Acute on chronic combined systolic (congestive) and diastolic (congestive) heart failure (HCC) -     BMP8+EGFR -     CBC with Differential/Platelet -     Pro b natriuretic peptide  Shortness of breath  Peripheral edema  Reviewed ER notes, labs, imaging. Shortness of breath and edema now resolved. Weight is down per Epic records. Discussed to check BP at home and cut lasix back to 10 mg BID if BP remains <100/50. Will recheck labs today. On potassium supplement BID. Referral to cardiology was placed in ER. She will  notify us if she does not receive a call regarding the referral. Weight daily and notify for weight gain, shortness of breath, edema. Keep scheduled follow up with PCP, sooner for new or worsening symtpoms.   The patient indicates understanding of these issues and agrees with the plan.  Gabriel Earing, FNP

## 2023-03-17 ENCOUNTER — Telehealth: Payer: Self-pay | Admitting: Cardiology

## 2023-03-17 NOTE — Telephone Encounter (Signed)
Pt states she was seen in ER for SOB and edema. She wants to know is there anything she should be doing in the meantime. I informed Dr. Antoine Poche does not have any openings in [place] before her scheduled appt 05/13/23.

## 2023-03-19 ENCOUNTER — Telehealth: Payer: Self-pay

## 2023-03-19 NOTE — Telephone Encounter (Signed)
Transition Care Management Unsuccessful Follow-up Telephone Call  Date of discharge and from where:  [name] 8/9  Attempts:  1st Attempt  Reason for unsuccessful TCM follow-up call:  No answer/busy   Lenard Forth [facility] Guide, [facility] [phone] [address] [place], [place], [place] [address] Phone: [phone] Email: [email or web address]

## 2023-03-19 NOTE — Telephone Encounter (Signed)
Transition Care Management Unsuccessful Follow-up Telephone Call  Date of discharge and from where:  [facility] 8/[facility]  Attempts:  2nd Attempt  Reason for unsuccessful TCM follow-up call:  No answer/busy   Lenard Forth [facility] Guide, [facility] [phone] [address] [place], [place], [place] [address] Phone: [phone] Email: [email or web address]

## 2023-03-23 DIAGNOSIS — H353133 Nonexudative age-related macular degeneration, bilateral, advanced atrophic without subfoveal involvement: Secondary | ICD-10-CM | POA: Diagnosis not present

## 2023-03-23 DIAGNOSIS — H35373 Puckering of macula, bilateral: Secondary | ICD-10-CM | POA: Diagnosis not present

## 2023-03-23 DIAGNOSIS — H35033 Hypertensive retinopathy, bilateral: Secondary | ICD-10-CM | POA: Diagnosis not present

## 2023-03-23 DIAGNOSIS — H43393 Other vitreous opacities, bilateral: Secondary | ICD-10-CM | POA: Diagnosis not present

## 2023-03-23 DIAGNOSIS — H353211 Exudative age-related macular degeneration, right eye, with active choroidal neovascularization: Secondary | ICD-10-CM | POA: Diagnosis not present

## 2023-03-23 DIAGNOSIS — H43813 Vitreous degeneration, bilateral: Secondary | ICD-10-CM | POA: Diagnosis not present

## 2023-04-07 NOTE — Telephone Encounter (Signed)
 Call to patient. LM to call office

## 2023-04-07 NOTE — Telephone Encounter (Signed)
Call to patient for sooner appt.  Set for Friday 9/6 at 2:30.  Need her to call back to confirm availability. This is for the [facility] office

## 2023-04-08 NOTE — Telephone Encounter (Signed)
Returned call to pt. LVM to call back. Will forward this to the provider for FYI.

## 2023-04-08 NOTE — Telephone Encounter (Signed)
Selina called and stated the patient will not come in for a sooner appointment. Kara Dies stated to keep her scheduled for 10/09 in [place] and cancel the appointment for 09/06 at [facility].

## 2023-04-08 NOTE — Telephone Encounter (Signed)
Call to son and VM pick up  but does not allow to LM . Call to [facility].  LM to have someone call office regarding appt for patient

## 2023-04-09 ENCOUNTER — Ambulatory Visit: Payer: Medicare HMO | Admitting: Family Medicine

## 2023-04-10 ENCOUNTER — Ambulatory Visit: Payer: Medicare HMO | Admitting: Cardiology

## 2023-04-13 DIAGNOSIS — H353123 Nonexudative age-related macular degeneration, left eye, advanced atrophic without subfoveal involvement: Secondary | ICD-10-CM | POA: Diagnosis not present

## 2023-05-11 DIAGNOSIS — H353211 Exudative age-related macular degeneration, right eye, with active choroidal neovascularization: Secondary | ICD-10-CM | POA: Diagnosis not present

## 2023-05-11 DIAGNOSIS — H43813 Vitreous degeneration, bilateral: Secondary | ICD-10-CM | POA: Diagnosis not present

## 2023-05-11 DIAGNOSIS — H35373 Puckering of macula, bilateral: Secondary | ICD-10-CM | POA: Diagnosis not present

## 2023-05-11 DIAGNOSIS — H35033 Hypertensive retinopathy, bilateral: Secondary | ICD-10-CM | POA: Diagnosis not present

## 2023-05-11 DIAGNOSIS — H353113 Nonexudative age-related macular degeneration, right eye, advanced atrophic without subfoveal involvement: Secondary | ICD-10-CM | POA: Diagnosis not present

## 2023-05-11 DIAGNOSIS — H43393 Other vitreous opacities, bilateral: Secondary | ICD-10-CM | POA: Diagnosis not present

## 2023-05-11 DIAGNOSIS — H353133 Nonexudative age-related macular degeneration, bilateral, advanced atrophic without subfoveal involvement: Secondary | ICD-10-CM | POA: Diagnosis not present

## 2023-05-12 NOTE — Progress Notes (Unsigned)
Cardiology Office Note:   Date:  05/13/2023  ID:  Krystal [name], DOB 10-Nov-1940, MRN [id number] PCP: Dettinger, Elige Radon, MD  [facility] Providers Cardiologist:  None {  History of Present Illness:   Krystal [name] is a 82 y.o. female who presents for follow up of SVT.    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  I sent her for this because of ongoing chest pain.  She did have a large hiatal hernia.   She was last in the hospital in Sept 2022.  She had acute respiratory failure.  She was treated for COPD.  She had PAF.    Since I last saw her she was in the emergency room with some lower extremity swelling.  I reviewed these records from a few months ago.  She had lower extremity swelling and some shortness of breath.  Her BNP was elevated.  She was given a few days of 20 mg x 2/day Lasix.  She took this and her swelling went away and she is now taking 20 alternating with half every other day of her Lasix.  She says her weight and her shortness of breath and swelling are controlled.  She watches her salt.  She is not drinking excessive fluid.  She denies any chest pressure, neck or arm discomfort.  She has had no new PND or orthopnea.  She has had no weight gain.  She gets around slowly with a walker.   ROS: As stated in the HPI and negative for all other systems.  Studies Reviewed:    EKG:   Atrial fibrillation, 81, axis within normal limits, intervals within normal limits, no acute ST-T wave changes  Risk Assessment/Calculations:    CHA2DS2-VASc Score = 5   This indicates a 7.2% annual risk of stroke. The patient's score is based upon: CHF History: 1 HTN History: 1 Diabetes History: 0 Stroke History: 0 Vascular Disease History: 0 Age Score: 2 Gender Score: 1         Physical Exam:   VS:  BP (!) 108/50   Pulse 72   Ht 5\' 3"  (1.6 m)   Wt 167 lb (75.8 kg)   BMI 29.58 kg/m    Wt Readings from Last 3 Encounters:  05/13/23 167 lb  (75.8 kg)  03/16/23 166 lb (75.3 kg)  03/13/23 172 lb 11.2 oz (78.3 kg)     GEN: Well nourished, well developed in no acute distress NECK: No JVD; No carotid bruits CARDIAC: Irregular RR, 2 out of 6 apical systolic murmur radiating slightly at the aortic outflow tract, no diastolic murmurs, rubs, gallops RESPIRATORY:  Clear to auscultation without rales, wheezing or rhonchi  ABDOMEN: Soft, non-tender, non-distended EXTREMITIES:  Mild leg edema; No deformity   ASSESSMENT AND PLAN:    CHEST PAIN:   She has nonobstructive CAD.   She has had no new symptoms.  No change in therapy.  PAF:   Krystal [name] has a CHA2DS2 - VASc score of 3.  She does not feel this.  She tolerates anticoagulation.  No change in therapy.   DYSLIPIDEMIA:  LDL was 63 with an HDL of 61.  No change in therapy.   VALVULAR HEART DISEASE:   There is mild AI in Feb 2023.  I do not suspect clinically that this is change.  No change in therapy.   DYSPNEA:   She has had acute on chronic diastolic heart failure.  She  seems to be euvolemic now.  We talked about as needed dosing of her diuretic.  No change in therapy.     Follow up with me in one year.   Signed, Rollene Rotunda, MD

## 2023-05-13 ENCOUNTER — Ambulatory Visit (INDEPENDENT_AMBULATORY_CARE_PROVIDER_SITE_OTHER): Payer: Medicare HMO | Admitting: Cardiology

## 2023-05-13 ENCOUNTER — Encounter: Payer: Self-pay | Admitting: Cardiology

## 2023-05-13 VITALS — BP 108/50 | HR 72 | Ht 63.0 in | Wt 167.0 lb

## 2023-05-13 DIAGNOSIS — I471 Supraventricular tachycardia, unspecified: Secondary | ICD-10-CM | POA: Diagnosis not present

## 2023-05-13 DIAGNOSIS — E785 Hyperlipidemia, unspecified: Secondary | ICD-10-CM

## 2023-05-13 DIAGNOSIS — R072 Precordial pain: Secondary | ICD-10-CM

## 2023-05-13 DIAGNOSIS — I480 Paroxysmal atrial fibrillation: Secondary | ICD-10-CM

## 2023-05-13 DIAGNOSIS — R0602 Shortness of breath: Secondary | ICD-10-CM | POA: Diagnosis not present

## 2023-05-13 NOTE — Patient Instructions (Signed)

## 2023-05-17 ENCOUNTER — Other Ambulatory Visit: Payer: Self-pay | Admitting: Family Medicine

## 2023-05-22 ENCOUNTER — Encounter: Payer: Self-pay | Admitting: Family Medicine

## 2023-05-22 ENCOUNTER — Ambulatory Visit (INDEPENDENT_AMBULATORY_CARE_PROVIDER_SITE_OTHER): Payer: Medicare HMO | Admitting: Family Medicine

## 2023-05-22 VITALS — BP 115/63 | HR 59 | Ht 63.0 in | Wt 162.0 lb

## 2023-05-22 DIAGNOSIS — I480 Paroxysmal atrial fibrillation: Secondary | ICD-10-CM | POA: Diagnosis not present

## 2023-05-22 DIAGNOSIS — I5042 Chronic combined systolic (congestive) and diastolic (congestive) heart failure: Secondary | ICD-10-CM

## 2023-05-22 DIAGNOSIS — R600 Localized edema: Secondary | ICD-10-CM | POA: Diagnosis not present

## 2023-05-22 DIAGNOSIS — F411 Generalized anxiety disorder: Secondary | ICD-10-CM | POA: Diagnosis not present

## 2023-05-22 DIAGNOSIS — Z23 Encounter for immunization: Secondary | ICD-10-CM | POA: Diagnosis not present

## 2023-05-22 DIAGNOSIS — E782 Mixed hyperlipidemia: Secondary | ICD-10-CM | POA: Diagnosis not present

## 2023-05-22 DIAGNOSIS — K219 Gastro-esophageal reflux disease without esophagitis: Secondary | ICD-10-CM | POA: Diagnosis not present

## 2023-05-22 DIAGNOSIS — K581 Irritable bowel syndrome with constipation: Secondary | ICD-10-CM

## 2023-05-22 DIAGNOSIS — F339 Major depressive disorder, recurrent, unspecified: Secondary | ICD-10-CM | POA: Diagnosis not present

## 2023-05-22 DIAGNOSIS — I2510 Atherosclerotic heart disease of native coronary artery without angina pectoris: Secondary | ICD-10-CM | POA: Diagnosis not present

## 2023-05-22 MED ORDER — METOPROLOL TARTRATE 25 MG PO TABS
25.0000 mg | ORAL_TABLET | Freq: Two times a day (BID) | ORAL | 3 refills | Status: DC
Start: 2023-05-22 — End: 2023-08-21

## 2023-05-22 MED ORDER — LINACLOTIDE 72 MCG PO CAPS: 72.0000 ug | ORAL_CAPSULE | Freq: Every day | ORAL | 0 refills | Status: DC

## 2023-05-22 MED ORDER — POTASSIUM CHLORIDE CRYS ER 20 MEQ PO TBCR: 20.0000 meq | EXTENDED_RELEASE_TABLET | Freq: Every day | ORAL | 3 refills | Status: DC

## 2023-05-22 MED ORDER — FERROUS SULFATE 325 (65 FE) MG PO TABS
325.0000 mg | ORAL_TABLET | Freq: Every day | ORAL | 3 refills | Status: DC
Start: 2023-05-22 — End: 2024-05-04

## 2023-05-22 MED ORDER — LINACLOTIDE 72 MCG PO CAPS
72.0000 ug | ORAL_CAPSULE | Freq: Every day | ORAL | 5 refills | Status: DC
Start: 2023-05-22 — End: 2024-04-28

## 2023-05-22 NOTE — Addendum Note (Signed)
Addended by: Dorene Sorrow on: 05/22/2023 03:13 PM   Modules accepted: Orders

## 2023-05-22 NOTE — Progress Notes (Signed)
BP 115/63   Pulse (!) 59   Ht 5\' 3"  (1.6 m)   Wt 162 lb (73.5 kg)   SpO2 92%   BMI 28.70 kg/m    Subjective:   Patient ID: Krystal [name], female    DOB: 1941-05-07, 82 y.o.   MRN: [id number]  HPI: Krystal [name] is a 82 y.o. female presenting on 05/22/2023 for Medical Management of Chronic Issues, Hyperlipidemia, and Anxiety   HPI Hyperlipidemia Patient is coming in for recheck of his hyperlipidemia. The patient is currently taking Crestor. They deny any issues with myalgias or history of liver damage from it. They deny any focal numbness or weakness or chest pain.   GERD Patient is currently on Nexium.  She denies any major symptoms or abdominal pain or belching or burping. She denies any blood in her stool or lightheadedness or dizziness.   Patient has been having some more hearing issues and they want to get her ears checked to see if she has any wax but are not opposed to getting hearing aids if that is what is needed.  Anxiety depression recheck Patient is coming in today for anxiety and depression recheck.  She is currently taking sertraline and buspirone.  She feels like she is doing well with anxiety depression.  Denies any major issues or suicidal ideations or thoughts of hurting self.    03/16/2023    4:02 PM 08/13/2022    1:50 PM 02/10/2022    4:23 PM 09/04/2021    1:37 PM 08/09/2021    2:09 PM  Depression screen PHQ 2/9  Decreased Interest 0 0 0 0 0  Down, Depressed, Hopeless 0 0 0 0 0  PHQ - 2 Score 0 0 0 0 0  Altered sleeping 1      Tired, decreased energy 0      Change in appetite 0      Feeling bad or failure about yourself  0      Trouble concentrating 0      Moving slowly or fidgety/restless 0      Suicidal thoughts 0      PHQ-9 Score 1      Difficult doing work/chores Not difficult at all         CAD and A-fib and CHF Patient has CAD and A-fib and CHF and sees cardiology for it.  She is currently taking diltiazem and Eliquis and furosemide and  Crestor.  She just saw the cardiologist last week and things checked out  Abdominal complaints Patient has intermittent constipation and has tried milk of magnesium some and MiraLAX some in the past and they have not fully worked for her or they work a little bit but they tear her stomach up some.  She would like to try Linzess.  She has not tried that before but she has heard that it does well.  Relevant past medical, surgical, family and social history reviewed and updated as indicated. Interim medical history since our last visit reviewed. Allergies and medications reviewed and updated.  Review of Systems  Constitutional:  Negative for chills and fever.  HENT:  Negative for congestion, ear discharge and ear pain.   Eyes:  Negative for redness and visual disturbance.  Respiratory:  Negative for chest tightness and shortness of breath.   Cardiovascular:  Negative for chest pain and leg swelling.  Gastrointestinal:  Positive for abdominal pain and constipation. Negative for abdominal distention, blood in stool, diarrhea, nausea and vomiting.  Genitourinary:  Negative for difficulty urinating and dysuria.  Musculoskeletal:  Negative for back pain and gait problem.  Skin:  Negative for rash.  Neurological:  Negative for light-headedness and headaches.  Psychiatric/Behavioral:  Negative for agitation and behavioral problems.   All other systems reviewed and are negative.   Per HPI unless specifically indicated above   Allergies as of 05/22/2023       Reactions   Actonel [risedronate] Nausea And Vomiting, Other (See Comments)   Throat tightness   Fosamax [alendronate Sodium] Other (See Comments)   esophagitis   Mevacor [lovastatin] Other (See Comments)   myalgias   Miacalcin [calcitonin (salmon)] Nausea And Vomiting   Mobic [meloxicam] Hives, Itching   rash        Medication List        Accurate as of May 22, 2023  2:23 PM. If you have any questions, ask your nurse or  doctor.          STOP taking these medications    hydrOXYzine 25 MG capsule Commonly known as: VISTARIL Stopped by: Elige Radon [place]lee [place]       TAKE these medications    albuterol 108 (90 Base) MCG/ACT inhaler Commonly known as: VENTOLIN HFA Inhale 2 puffs by mouth 4 times daily as needed   busPIRone 5 MG tablet Commonly known as: BUSPAR Take 1 tablet (5 mg total) by mouth 3 (three) times daily as needed.   Calcium Citrate-Vitamin D 315-250 MG-UNIT Tabs Commonly known as: Calcium Citrate + D Take 2 tablets by mouth daily.   diclofenac Sodium 1 % Gel Commonly known as: VOLTAREN APPLY 2 GRAMS TO AFFECTED AREA 4 TIMES A DAY   diltiazem 300 MG 24 hr capsule Commonly known as: CARDIZEM CD TAKE 1 CAPSULE BY MOUTH EVERY DAY   Eliquis 5 MG Tabs tablet Generic drug: apixaban TAKE 1 TABLET BY MOUTH TWICE A DAY   esomeprazole 40 MG capsule Commonly known as: NEXIUM Take 1 capsule (40 mg total) by mouth daily.   famotidine 20 MG tablet Commonly known as: PEPCID TAKE ONE TABLET DAILY AFTER SUPPER   ferrous sulfate 325 (65 FE) MG tablet Take 1 tablet (325 mg total) by mouth at bedtime.   fluticasone 50 MCG/ACT nasal spray Commonly known as: FLONASE Place 2 sprays into both nostrils daily.   furosemide 20 MG tablet Commonly known as: LASIX Take 0.5 tablets (10 mg total) by mouth daily. What changed:  how much to take additional instructions   ipratropium-albuterol 0.5-2.5 (3) MG/3ML Soln Commonly known as: DUONEB Take 3 mLs by nebulization every 6 (six) hours as needed. Dx:  Asthma 493.90   linaclotide 72 MCG capsule Commonly known as: Linzess Take 1 capsule (72 mcg total) by mouth daily before breakfast. Started by: Elige Radon Paytience Bures   metoprolol tartrate 25 MG tablet Commonly known as: LOPRESSOR Take 1 tablet (25 mg total) by mouth 2 (two) times daily.   montelukast 10 MG tablet Commonly known as: SINGULAIR TAKE 1 TABLET BY MOUTH EVERYDAY AT BEDTIME    nitroGLYCERIN 0.4 MG/SPRAY spray Commonly known as: NITROLINGUAL ONE SPRAY UNDER TONGUE AS NEED FOR CHESTPAIN. MAY REPEAT IN 5 MINUTES IF PAIN NOT RELIEVED CALL 911.   potassium chloride SA 20 MEQ tablet Commonly known as: Klor-Con M20 Take 1 tablet (20 mEq total) by mouth daily.   PRESERVISION/LUTEIN PO Take 1 capsule by mouth at bedtime.   rosuvastatin 20 MG tablet Commonly known as: CRESTOR TAKE 1 TABLET BY MOUTH EVERY DAY   sertraline 50  MG tablet Commonly known as: ZOLOFT Take 1 tablet (50 mg total) by mouth daily.   Systane 0.4-0.3 % Soln Generic drug: Polyethyl Glycol-Propyl Glycol Place 1 drop into both eyes 2 (two) times daily.   Vitamin D3 125 MCG (5000 UT) Caps Take 5,000 Units by mouth daily.         Objective:   BP 115/63   Pulse (!) 59   Ht 5\' 3"  (1.6 m)   Wt 162 lb (73.5 kg)   SpO2 92%   BMI 28.70 kg/m   Wt Readings from Last 3 Encounters:  05/22/23 162 lb (73.5 kg)  05/13/23 167 lb (75.8 kg)  03/16/23 166 lb (75.3 kg)    Physical Exam Vitals and nursing note reviewed.  Constitutional:      General: She is not in acute distress.    Appearance: She is well-developed. She is not diaphoretic.  Eyes:     Conjunctiva/sclera: Conjunctivae normal.  Cardiovascular:     Rate and Rhythm: Normal rate and regular rhythm.     Heart sounds: Normal heart sounds. No murmur heard. Pulmonary:     Effort: Pulmonary effort is normal. No respiratory distress.     Breath sounds: Normal breath sounds. No wheezing.  Abdominal:     General: Abdomen is flat. Bowel sounds are normal. There is no distension.     Tenderness: There is no abdominal tenderness. There is no guarding or rebound.  Musculoskeletal:        General: No tenderness. Normal range of motion.  Skin:    General: Skin is warm and dry.     Findings: No rash.  Neurological:     Mental Status: She is alert and oriented to person, place, and time.     Coordination: Coordination normal.   Psychiatric:        Behavior: Behavior normal.       Assessment & Plan:   Problem List Items Addressed This Visit       Cardiovascular and Mediastinum   CAD (coronary artery disease)   Relevant Medications   metoprolol tartrate (LOPRESSOR) 25 MG tablet   Other Relevant Orders   CBC with Differential/Platelet   PAF (paroxysmal atrial fibrillation) (HCC)   Relevant Medications   metoprolol tartrate (LOPRESSOR) 25 MG tablet   Other Relevant Orders   CBC with Differential/Platelet   CMP14+EGFR   Chronic combined systolic and diastolic heart failure (HCC)   Relevant Medications   metoprolol tartrate (LOPRESSOR) 25 MG tablet     Digestive   GERD   Relevant Medications   linaclotide (LINZESS) 72 MCG capsule   Other Relevant Orders   CBC with Differential/Platelet     Other   Mixed hyperlipidemia - Primary   Relevant Medications   metoprolol tartrate (LOPRESSOR) 25 MG tablet   Other Relevant Orders   CBC with Differential/Platelet   CMP14+EGFR   Lipid panel   Anxiety state   Depression, recurrent (HCC)   Other Visit Diagnoses     Peripheral edema       Relevant Medications   ferrous sulfate 325 (65 FE) MG tablet   metoprolol tartrate (LOPRESSOR) 25 MG tablet   Other Relevant Orders   CBC with Differential/Platelet   Irritable bowel syndrome with constipation       Relevant Medications   linaclotide (LINZESS) 72 MCG capsule       Continue current medicine, will try Linzess to see if it helps with her Follow up plan: Return in about 6 months (around  11/20/2023), or if symptoms worsen or fail to improve, for CAD and GERD and hyperlipidemia.  Counseling provided for all of the vaccine components Orders Placed This Encounter  Procedures   CBC with Differential/Platelet   CMP14+EGFR   Lipid panel    Arville Care, MD [facility] 05/22/2023, 2:23 PM

## 2023-05-23 LAB — CMP14+EGFR
ALT: 51 [IU]/L — ABNORMAL HIGH (ref 0–32)
AST: 46 [IU]/L — ABNORMAL HIGH (ref 0–40)
Albumin: 4.1 g/dL (ref 3.7–4.7)
Alkaline Phosphatase: 84 [IU]/L (ref 44–121)
BUN/Creatinine Ratio: 23 (ref 12–28)
BUN: 22 mg/dL (ref 8–27)
Bilirubin Total: 0.4 mg/dL (ref 0.0–1.2)
CO2: 25 mmol/L (ref 20–29)
Calcium: 9.8 mg/dL (ref 8.7–10.3)
Chloride: 105 mmol/L (ref 96–106)
Creatinine, Ser: 0.96 mg/dL (ref 0.57–1.00)
Globulin, Total: 2 g/dL (ref 1.5–4.5)
Glucose: 84 mg/dL (ref 70–99)
Potassium: 4.7 mmol/L (ref 3.5–5.2)
Sodium: 144 mmol/L (ref 134–144)
Total Protein: 6.1 g/dL (ref 6.0–8.5)
eGFR: 58 mL/min/{1.73_m2} — ABNORMAL LOW (ref >59)

## 2023-05-23 LAB — CBC WITH DIFFERENTIAL/PLATELET
Basophils Absolute: 0 10*3/uL (ref 0.0–0.2)
Basos: 0 %
EOS (ABSOLUTE): 0.1 10*3/uL (ref 0.0–0.4)
Eos: 2 %
Hematocrit: 38.9 % (ref 34.0–46.6)
Hemoglobin: 12.3 g/dL (ref 11.1–15.9)
Immature Grans (Abs): 0 10*3/uL (ref 0.0–0.1)
Immature Granulocytes: 0 %
Lymphocytes Absolute: 1.9 10*3/uL (ref 0.7–3.1)
Lymphs: 31 %
MCH: 29.2 pg (ref 26.6–33.0)
MCHC: 31.6 g/dL (ref 31.5–35.7)
MCV: 92 fL (ref 79–97)
Monocytes Absolute: 0.6 10*3/uL (ref 0.1–0.9)
Monocytes: 9 %
Neutrophils Absolute: 3.4 10*3/uL (ref 1.4–7.0)
Neutrophils: 58 %
Platelets: 172 10*3/uL (ref 150–450)
RBC: 4.21 x10E6/uL (ref 3.77–5.28)
RDW: 13.2 % (ref 11.7–15.4)
WBC: 5.9 10*3/uL (ref 3.4–10.8)

## 2023-05-23 LAB — LIPID PANEL
Chol/HDL Ratio: 1.9 {ratio} (ref 0.0–4.4)
Cholesterol, Total: 111 mg/dL (ref 100–199)
HDL: 59 mg/dL (ref >39)
LDL Chol Calc (NIH): 34 mg/dL (ref 0–99)
Triglycerides: 94 mg/dL (ref 0–149)
VLDL Cholesterol Cal: 18 mg/dL (ref 5–40)

## 2023-06-04 DIAGNOSIS — C50911 Malignant neoplasm of unspecified site of right female breast: Secondary | ICD-10-CM | POA: Diagnosis not present

## 2023-06-10 DIAGNOSIS — M170 Bilateral primary osteoarthritis of knee: Secondary | ICD-10-CM | POA: Diagnosis not present

## 2023-06-23 ENCOUNTER — Other Ambulatory Visit: Payer: Self-pay | Admitting: Cardiology

## 2023-06-23 DIAGNOSIS — I480 Paroxysmal atrial fibrillation: Secondary | ICD-10-CM

## 2023-06-23 NOTE — Telephone Encounter (Signed)
Prescription refill request for Eliquis received. Indication: AF Last office visit: 05/13/23  Daiva Nakayama MD Scr: 0.96 on 05/22/23  Epic Age: 82 Weight: 75.8kg  Based on above findings Eliquis 5mg  twice daily is the appropriate dose.  Refill approved.

## 2023-06-24 ENCOUNTER — Encounter: Payer: Self-pay | Admitting: *Deleted

## 2023-06-30 DIAGNOSIS — M170 Bilateral primary osteoarthritis of knee: Secondary | ICD-10-CM | POA: Diagnosis not present

## 2023-07-07 DIAGNOSIS — M170 Bilateral primary osteoarthritis of knee: Secondary | ICD-10-CM | POA: Diagnosis not present

## 2023-07-13 DIAGNOSIS — H353211 Exudative age-related macular degeneration, right eye, with active choroidal neovascularization: Secondary | ICD-10-CM | POA: Diagnosis not present

## 2023-07-14 DIAGNOSIS — M170 Bilateral primary osteoarthritis of knee: Secondary | ICD-10-CM | POA: Diagnosis not present

## 2023-07-21 DIAGNOSIS — M65341 Trigger finger, right ring finger: Secondary | ICD-10-CM | POA: Diagnosis not present

## 2023-07-21 DIAGNOSIS — M79641 Pain in right hand: Secondary | ICD-10-CM | POA: Diagnosis not present

## 2023-07-21 DIAGNOSIS — M720 Palmar fascial fibromatosis [Dupuytren]: Secondary | ICD-10-CM | POA: Diagnosis not present

## 2023-08-15 ENCOUNTER — Encounter (HOSPITAL_COMMUNITY): Payer: Self-pay

## 2023-08-15 DIAGNOSIS — Z90710 Acquired absence of both cervix and uterus: Secondary | ICD-10-CM | POA: Diagnosis not present

## 2023-08-15 DIAGNOSIS — M25521 Pain in right elbow: Secondary | ICD-10-CM | POA: Diagnosis not present

## 2023-08-15 DIAGNOSIS — W19XXXA Unspecified fall, initial encounter: Secondary | ICD-10-CM | POA: Diagnosis not present

## 2023-08-15 DIAGNOSIS — E785 Hyperlipidemia, unspecified: Secondary | ICD-10-CM | POA: Diagnosis not present

## 2023-08-15 DIAGNOSIS — S42351A Displaced comminuted fracture of shaft of humerus, right arm, initial encounter for closed fracture: Secondary | ICD-10-CM | POA: Diagnosis not present

## 2023-08-15 DIAGNOSIS — Z7901 Long term (current) use of anticoagulants: Secondary | ICD-10-CM | POA: Diagnosis not present

## 2023-08-15 DIAGNOSIS — W06XXXA Fall from bed, initial encounter: Secondary | ICD-10-CM | POA: Diagnosis not present

## 2023-08-15 DIAGNOSIS — S53104A Unspecified dislocation of right ulnohumeral joint, initial encounter: Secondary | ICD-10-CM | POA: Diagnosis not present

## 2023-08-15 DIAGNOSIS — Z79899 Other long term (current) drug therapy: Secondary | ICD-10-CM | POA: Diagnosis not present

## 2023-08-15 DIAGNOSIS — R609 Edema, unspecified: Secondary | ICD-10-CM | POA: Diagnosis not present

## 2023-08-15 DIAGNOSIS — I10 Essential (primary) hypertension: Secondary | ICD-10-CM | POA: Diagnosis not present

## 2023-08-15 DIAGNOSIS — S59801A Other specified injuries of right elbow, initial encounter: Secondary | ICD-10-CM | POA: Diagnosis not present

## 2023-08-15 DIAGNOSIS — S42401A Unspecified fracture of lower end of right humerus, initial encounter for closed fracture: Secondary | ICD-10-CM | POA: Diagnosis not present

## 2023-08-15 DIAGNOSIS — M8580 Other specified disorders of bone density and structure, unspecified site: Secondary | ICD-10-CM | POA: Diagnosis not present

## 2023-08-15 DIAGNOSIS — Z7952 Long term (current) use of systemic steroids: Secondary | ICD-10-CM | POA: Diagnosis not present

## 2023-08-16 ENCOUNTER — Other Ambulatory Visit: Payer: Self-pay

## 2023-08-16 ENCOUNTER — Encounter (HOSPITAL_COMMUNITY): Payer: Self-pay

## 2023-08-16 ENCOUNTER — Emergency Department (HOSPITAL_COMMUNITY): Payer: Medicare HMO

## 2023-08-16 ENCOUNTER — Other Ambulatory Visit: Payer: Self-pay | Admitting: Family Medicine

## 2023-08-16 ENCOUNTER — Inpatient Hospital Stay (HOSPITAL_COMMUNITY)
Admission: EM | Admit: 2023-08-16 | Discharge: 2023-08-21 | DRG: 493 | Disposition: A | Discharge status: Skilled Nursing Facility | Payer: Medicare HMO | Attending: Internal Medicine | Admitting: Internal Medicine

## 2023-08-16 DIAGNOSIS — M545 Low back pain, unspecified: Secondary | ICD-10-CM | POA: Diagnosis not present

## 2023-08-16 DIAGNOSIS — S42401A Unspecified fracture of lower end of right humerus, initial encounter for closed fracture: Secondary | ICD-10-CM | POA: Diagnosis not present

## 2023-08-16 DIAGNOSIS — Z888 Allergy status to other drugs, medicaments and biological substances status: Secondary | ICD-10-CM

## 2023-08-16 DIAGNOSIS — E876 Hypokalemia: Secondary | ICD-10-CM | POA: Diagnosis not present

## 2023-08-16 DIAGNOSIS — F32A Depression, unspecified: Secondary | ICD-10-CM | POA: Diagnosis present

## 2023-08-16 DIAGNOSIS — Z7901 Long term (current) use of anticoagulants: Secondary | ICD-10-CM

## 2023-08-16 DIAGNOSIS — E782 Mixed hyperlipidemia: Secondary | ICD-10-CM | POA: Diagnosis present

## 2023-08-16 DIAGNOSIS — M810 Age-related osteoporosis without current pathological fracture: Secondary | ICD-10-CM | POA: Diagnosis present

## 2023-08-16 DIAGNOSIS — Z8262 Family history of osteoporosis: Secondary | ICD-10-CM

## 2023-08-16 DIAGNOSIS — I959 Hypotension, unspecified: Secondary | ICD-10-CM | POA: Diagnosis not present

## 2023-08-16 DIAGNOSIS — E871 Hypo-osmolality and hyponatremia: Secondary | ICD-10-CM | POA: Diagnosis not present

## 2023-08-16 DIAGNOSIS — R2689 Other abnormalities of gait and mobility: Secondary | ICD-10-CM | POA: Diagnosis not present

## 2023-08-16 DIAGNOSIS — J449 Chronic obstructive pulmonary disease, unspecified: Secondary | ICD-10-CM | POA: Diagnosis not present

## 2023-08-16 DIAGNOSIS — M799 Soft tissue disorder, unspecified: Secondary | ICD-10-CM | POA: Diagnosis not present

## 2023-08-16 DIAGNOSIS — Z743 Need for continuous supervision: Secondary | ICD-10-CM | POA: Diagnosis not present

## 2023-08-16 DIAGNOSIS — G5630 Lesion of radial nerve, unspecified upper limb: Secondary | ICD-10-CM | POA: Insufficient documentation

## 2023-08-16 DIAGNOSIS — W06XXXA Fall from bed, initial encounter: Secondary | ICD-10-CM | POA: Diagnosis present

## 2023-08-16 DIAGNOSIS — R58 Hemorrhage, not elsewhere classified: Secondary | ICD-10-CM | POA: Diagnosis not present

## 2023-08-16 DIAGNOSIS — D62 Acute posthemorrhagic anemia: Secondary | ICD-10-CM | POA: Diagnosis not present

## 2023-08-16 DIAGNOSIS — I482 Chronic atrial fibrillation, unspecified: Secondary | ICD-10-CM | POA: Diagnosis present

## 2023-08-16 DIAGNOSIS — Z90711 Acquired absence of uterus with remaining cervical stump: Secondary | ICD-10-CM | POA: Diagnosis not present

## 2023-08-16 DIAGNOSIS — Z83438 Family history of other disorder of lipoprotein metabolism and other lipidemia: Secondary | ICD-10-CM | POA: Diagnosis not present

## 2023-08-16 DIAGNOSIS — S42411B Displaced simple supracondylar fracture without intercondylar fracture of right humerus, initial encounter for open fracture: Secondary | ICD-10-CM | POA: Diagnosis present

## 2023-08-16 DIAGNOSIS — K219 Gastro-esophageal reflux disease without esophagitis: Secondary | ICD-10-CM | POA: Diagnosis not present

## 2023-08-16 DIAGNOSIS — I480 Paroxysmal atrial fibrillation: Secondary | ICD-10-CM

## 2023-08-16 DIAGNOSIS — Z9889 Other specified postprocedural states: Secondary | ICD-10-CM | POA: Diagnosis not present

## 2023-08-16 DIAGNOSIS — S42401B Unspecified fracture of lower end of right humerus, initial encounter for open fracture: Secondary | ICD-10-CM | POA: Diagnosis not present

## 2023-08-16 DIAGNOSIS — H548 Legal blindness, as defined in USA: Secondary | ICD-10-CM | POA: Diagnosis not present

## 2023-08-16 DIAGNOSIS — Z886 Allergy status to analgesic agent status: Secondary | ICD-10-CM

## 2023-08-16 DIAGNOSIS — J4489 Other specified chronic obstructive pulmonary disease: Secondary | ICD-10-CM | POA: Diagnosis present

## 2023-08-16 DIAGNOSIS — W19XXXA Unspecified fall, initial encounter: Secondary | ICD-10-CM | POA: Diagnosis not present

## 2023-08-16 DIAGNOSIS — Z87891 Personal history of nicotine dependence: Secondary | ICD-10-CM | POA: Diagnosis not present

## 2023-08-16 DIAGNOSIS — E46 Unspecified protein-calorie malnutrition: Secondary | ICD-10-CM | POA: Diagnosis not present

## 2023-08-16 DIAGNOSIS — I2510 Atherosclerotic heart disease of native coronary artery without angina pectoris: Secondary | ICD-10-CM | POA: Diagnosis not present

## 2023-08-16 DIAGNOSIS — E8809 Other disorders of plasma-protein metabolism, not elsewhere classified: Secondary | ICD-10-CM | POA: Insufficient documentation

## 2023-08-16 DIAGNOSIS — Z85828 Personal history of other malignant neoplasm of skin: Secondary | ICD-10-CM | POA: Diagnosis not present

## 2023-08-16 DIAGNOSIS — S42409B Unspecified fracture of lower end of unspecified humerus, initial encounter for open fracture: Secondary | ICD-10-CM | POA: Insufficient documentation

## 2023-08-16 DIAGNOSIS — S42411A Displaced simple supracondylar fracture without intercondylar fracture of right humerus, initial encounter for closed fracture: Secondary | ICD-10-CM | POA: Diagnosis not present

## 2023-08-16 DIAGNOSIS — Z8249 Family history of ischemic heart disease and other diseases of the circulatory system: Secondary | ICD-10-CM

## 2023-08-16 DIAGNOSIS — G8929 Other chronic pain: Secondary | ICD-10-CM | POA: Diagnosis not present

## 2023-08-16 DIAGNOSIS — I10 Essential (primary) hypertension: Secondary | ICD-10-CM | POA: Diagnosis not present

## 2023-08-16 DIAGNOSIS — R531 Weakness: Secondary | ICD-10-CM | POA: Diagnosis not present

## 2023-08-16 DIAGNOSIS — F419 Anxiety disorder, unspecified: Secondary | ICD-10-CM | POA: Diagnosis present

## 2023-08-16 DIAGNOSIS — Y92003 Bedroom of unspecified non-institutional (private) residence as the place of occurrence of the external cause: Secondary | ICD-10-CM

## 2023-08-16 DIAGNOSIS — F339 Major depressive disorder, recurrent, unspecified: Secondary | ICD-10-CM | POA: Diagnosis not present

## 2023-08-16 DIAGNOSIS — G5631 Lesion of radial nerve, right upper limb: Secondary | ICD-10-CM | POA: Diagnosis present

## 2023-08-16 DIAGNOSIS — S42351A Displaced comminuted fracture of shaft of humerus, right arm, initial encounter for closed fracture: Secondary | ICD-10-CM | POA: Diagnosis not present

## 2023-08-16 DIAGNOSIS — R609 Edema, unspecified: Secondary | ICD-10-CM | POA: Diagnosis not present

## 2023-08-16 DIAGNOSIS — M62562 Muscle wasting and atrophy, not elsewhere classified, left lower leg: Secondary | ICD-10-CM | POA: Diagnosis not present

## 2023-08-16 DIAGNOSIS — Z9181 History of falling: Secondary | ICD-10-CM | POA: Diagnosis not present

## 2023-08-16 DIAGNOSIS — M62561 Muscle wasting and atrophy, not elsewhere classified, right lower leg: Secondary | ICD-10-CM | POA: Diagnosis not present

## 2023-08-16 DIAGNOSIS — H3530 Unspecified macular degeneration: Secondary | ICD-10-CM | POA: Diagnosis not present

## 2023-08-16 DIAGNOSIS — Z79899 Other long term (current) drug therapy: Secondary | ICD-10-CM

## 2023-08-16 DIAGNOSIS — S42422B Displaced comminuted supracondylar fracture without intercondylar fracture of left humerus, initial encounter for open fracture: Secondary | ICD-10-CM | POA: Diagnosis not present

## 2023-08-16 DIAGNOSIS — S42411D Displaced simple supracondylar fracture without intercondylar fracture of right humerus, subsequent encounter for fracture with routine healing: Secondary | ICD-10-CM | POA: Diagnosis not present

## 2023-08-16 DIAGNOSIS — M25421 Effusion, right elbow: Secondary | ICD-10-CM | POA: Diagnosis not present

## 2023-08-16 DIAGNOSIS — D696 Thrombocytopenia, unspecified: Secondary | ICD-10-CM | POA: Diagnosis present

## 2023-08-16 DIAGNOSIS — G8918 Other acute postprocedural pain: Secondary | ICD-10-CM | POA: Diagnosis not present

## 2023-08-16 DIAGNOSIS — S42401D Unspecified fracture of lower end of right humerus, subsequent encounter for fracture with routine healing: Secondary | ICD-10-CM | POA: Diagnosis not present

## 2023-08-16 LAB — CBC WITH DIFFERENTIAL/PLATELET
Abs Immature Granulocytes: 0.03 10*3/uL (ref 0.00–0.07)
Basophils Absolute: 0 10*3/uL (ref 0.0–0.1)
Basophils Relative: 0 %
Eosinophils Absolute: 0.1 10*3/uL (ref 0.0–0.5)
Eosinophils Relative: 1 %
HCT: 33.4 % — ABNORMAL LOW (ref 36.0–46.0)
Hemoglobin: 10.8 g/dL — ABNORMAL LOW (ref 12.0–15.0)
Immature Granulocytes: 1 %
Lymphocytes Relative: 24 %
Lymphs Abs: 1.5 10*3/uL (ref 0.7–4.0)
MCH: 29.4 pg (ref 26.0–34.0)
MCHC: 32.3 g/dL (ref 30.0–36.0)
MCV: 91 fL (ref 80.0–100.0)
Monocytes Absolute: 0.7 10*3/uL (ref 0.1–1.0)
Monocytes Relative: 11 %
Neutro Abs: 4 10*3/uL (ref 1.7–7.7)
Neutrophils Relative %: 63 %
Platelets: 133 10*3/uL — ABNORMAL LOW (ref 150–400)
RBC: 3.67 MIL/uL — ABNORMAL LOW (ref 3.87–5.11)
RDW: 14.6 % (ref 11.5–15.5)
WBC: 6.3 10*3/uL (ref 4.0–10.5)
nRBC: 0 % (ref 0.0–0.2)

## 2023-08-16 LAB — COMPREHENSIVE METABOLIC PANEL
ALT: 27 U/L (ref 0–44)
AST: 26 U/L (ref 15–41)
Albumin: 3 g/dL — ABNORMAL LOW (ref 3.5–5.0)
Alkaline Phosphatase: 50 U/L (ref 38–126)
Anion gap: 5 (ref 5–15)
BUN: 19 mg/dL (ref 8–23)
CO2: 28 mmol/L (ref 22–32)
Calcium: 8.2 mg/dL — ABNORMAL LOW (ref 8.9–10.3)
Chloride: 101 mmol/L (ref 98–111)
Creatinine, Ser: 0.68 mg/dL (ref 0.44–1.00)
GFR, Estimated: >60 mL/min (ref >60)
Glucose, Bld: 126 mg/dL — ABNORMAL HIGH (ref 70–99)
Potassium: 3.5 mmol/L (ref 3.5–5.1)
Sodium: 134 mmol/L — ABNORMAL LOW (ref 135–145)
Total Bilirubin: 0.5 mg/dL (ref 0.0–1.2)
Total Protein: 5.5 g/dL — ABNORMAL LOW (ref 6.5–8.1)

## 2023-08-16 LAB — TYPE AND SCREEN
ABO/RH(D): O POS
Antibody Screen: NEGATIVE

## 2023-08-16 MED ORDER — CEFAZOLIN SODIUM-DEXTROSE 2-4 GM/100ML-% IV SOLN
2.0000 g | Freq: Three times a day (TID) | INTRAVENOUS | Status: AC
  Administered 2023-08-16 – 2023-08-17 (×3): 2 g via INTRAVENOUS
  Filled 2023-08-16 (×3): qty 100

## 2023-08-16 MED ORDER — POVIDONE-IODINE 10 % EX SOLN
CUTANEOUS | Status: AC
  Administered 2023-08-16: 1
  Filled 2023-08-16: qty 59.2

## 2023-08-16 NOTE — H&P (Signed)
 History and Physical    Patient: Krystal [name] FMW:[id number] DOB: 07/03/1941 DOA: 08/16/2023 DOS: the patient was seen and examined on 08/17/2023 PCP: Dettinger, Fonda LABOR, MD  Patient coming from: Home  Chief Complaint:  Chief Complaint  Patient presents with   Fall   HPI: Krystal [name] is a 83 y.o. female with medical history significant of atrial fibrillation on Eliquis , GERD, hyperlipidemia, COPD who presents to the emergency department due to right humeral open fracture.  Patient fell off bed about 2 days ago and a presented to [facility] ED where she was diagnosed with fracture of her right arm.  She was placed in a posterior slab and discharged home.  Son noted that patient was having significant bleeding around the wound and that she was having difficulty using her right hand, so he brought her to the ED for further evaluation and management.  Patient states that the right hand was slightly numb and she had difficulty in being able to grip objects.  ED Course:  In the emergency department, BP was 111/51, other vital signs were within normal range.  Workup in the ED showed normocytic anemia and platelets of 133.  BMP was normal except for sodium of 134, blood glucose 126.  Albumin 3.0 CT of right upper extremity without contrast showed an open elbow fracture.  No dislocation.  Markedly comminuted and dorsally and laterally displaced supracondylar distal humeral fracture. Likely acute displaced and comminuted avulsion fracture along the olecranon. Acute nondisplaced radial head and neck fracture not excluded. Joint effusion. Heterogeneity of the surrounding elbow musculature with query underlying intramuscular hematoma. Right elbow x-ray showed comminuted displaced and overriding distal humeral fracture.  The wound was irrigated and skin was cleaned with Betadine .  Patient was started on 2 g cefazolin . Orthopedic surgery (Dr. Onesimo) was consulted and he coordinated patient's  transfer to [facility] with orthopedic trauma surgeon (Dr. Kendal) who will consult on patient when patient arrives to [facility]. Hospitalist was asked to admit patient for further evaluation and management.   Review of Systems: Review of systems as noted in the HPI. All other systems reviewed and are negative.   Past Medical History:  Diagnosis Date   Anemia    Asthma    Brain tumor (HCC)    1986   Breast cancer (HCC)    Right breast   Cataract    Colon polyps    COPD (chronic obstructive pulmonary disease) (HCC)    Hiatal hernia    Hyperlipidemia    Macular degeneration    Osteoporosis    Pneumonia    PVD (posterior vitreous detachment)    Right knee pain    Shingles rash    Past Surgical History:  Procedure Laterality Date   CATARACT EXTRACTION, BILATERAL     COLONOSCOPY     CORONARY PRESSURE/FFR WITH 3D MAPPING N/A 03/21/2021   Procedure: Coronary Pressure Wire/FFR w/3D Mapping;  Surgeon: Mady Bruckner, MD;  Location: [facility] INVASIVE [facility];  Service: Cardiovascular;  Laterality: N/A;   CRANIECTOMY / CRANIOTOMY FOR EXCISION OF BRAIN TUMOR     Left side brain   LEFT HEART CATH AND CORONARY ANGIOGRAPHY N/A 03/21/2021   Procedure: LEFT HEART CATH AND CORONARY ANGIOGRAPHY;  Surgeon: Mady Bruckner, MD;  Location: [facility] INVASIVE CV LAB;  Service: Cardiovascular;  Laterality: N/A;   MASTECTOMY Right    PARTIAL HYSTERECTOMY     POLYPECTOMY     SKIN CANCER EXCISION Left    Under left eye  Social History:  reports that she quit smoking about 62 years ago. Her smoking use included cigarettes. She has never used smokeless tobacco. She reports that she does not drink alcohol and does not use drugs.   Allergies  Allergen Reactions   Actonel [Risedronate] Nausea And Vomiting and Other (See Comments)    Throat tightness   Fosamax [Alendronate Sodium] Other (See Comments)    esophagitis   Mevacor [Lovastatin] Other (See Comments)    myalgias   Miacalcin [Calcitonin  ([place])] Nausea And Vomiting   Mobic  [Meloxicam ] Hives and Itching    rash    Family History  Problem Relation Age of Onset   Heart disease Mother 14       Stroke    Dementia Mother    Congestive Heart Failure Mother    Osteoporosis Mother    Stroke Mother    Heart disease Sister    Hypertension Sister    Diabetes Sister    Osteoporosis Sister    Heart disease Brother    Hyperlipidemia Brother    Diabetes Brother    Heart disease Sister    Hypertension Sister    Diabetes Sister    Diabetes Sister    Hypertension Sister    Diabetes Sister    Hypertension Sister    Diabetes Brother    Hyperlipidemia Brother    Cancer Father        lung   Heart attack Father    Diabetes Brother    Heart disease Brother    Hyperlipidemia Brother    Diabetes Brother    Diabetes Son    Colon cancer Neg Hx    Rectal cancer Neg Hx    Stomach cancer Neg Hx    Esophageal cancer Neg Hx      Prior to Admission medications   Medication Sig Start Date End Date Taking? Authorizing Provider  Calcium  Citrate-Vitamin D  (CALCIUM  CITRATE + D) 315-250 MG-UNIT TABS Take 2 tablets by mouth daily. 12/10/15  Yes Eckard, Tammy, RPH-CPP  Cholecalciferol  (VITAMIN D3) 125 MCG (5000 UT) CAPS Take 5,000 Units by mouth daily.   Yes [provider]  diltiazem  (CARDIZEM  CD) 300 MG 24 hr capsule TAKE 1 CAPSULE BY MOUTH EVERY DAY 09/05/22  Yes Dettinger, Fonda LABOR, MD  ELIQUIS  5 MG TABS tablet TAKE 1 TABLET BY MOUTH TWICE A DAY 06/23/23  Yes Lavona Agent, MD  esomeprazole  (NEXIUM ) 40 MG capsule Take 1 capsule (40 mg total) by mouth daily. 09/05/22  Yes Dettinger, Fonda LABOR, MD  famotidine  (PEPCID ) 20 MG tablet TAKE ONE TABLET DAILY AFTER SUPPER 09/08/22  Yes Darlean Ozell NOVAK, MD  ferrous sulfate  325 (65 FE) MG tablet Take 1 tablet (325 mg total) by mouth at bedtime. 05/22/23  Yes Dettinger, Fonda LABOR, MD  furosemide  (LASIX ) 20 MG tablet Take 0.5 tablets (10 mg total) by mouth daily. Patient taking differently: Take  20 mg by mouth daily. Take 1/2 tablet by mouth alternating days take one tablet 09/05/22  Yes Dettinger, Fonda LABOR, MD  ipratropium-albuterol  (DUONEB) 0.5-2.5 (3) MG/3ML SOLN Take 3 mLs by nebulization every 6 (six) hours as needed. Dx:  Asthma 493.90 09/05/22  Yes Dettinger, Fonda LABOR, MD  linaclotide  (LINZESS ) 72 MCG capsule Take 1 capsule (72 mcg total) by mouth daily before breakfast. 05/22/23  Yes Dettinger, Fonda LABOR, MD  metoprolol  tartrate (LOPRESSOR ) 25 MG tablet Take 1 tablet (25 mg total) by mouth 2 (two) times daily. 05/22/23  Yes Dettinger, Fonda LABOR, MD  montelukast  (SINGULAIR ) 10  MG tablet TAKE 1 TABLET BY MOUTH EVERYDAY AT BEDTIME 09/05/22  Yes Dettinger, Fonda LABOR, MD  Multiple Vitamins-Minerals (PRESERVISION/LUTEIN PO) Take 1 capsule by mouth at bedtime.    Yes [provider]  nitroGLYCERIN  (NITROLINGUAL ) 0.4 MG/SPRAY spray ONE SPRAY UNDER TONGUE AS NEED FOR CHESTPAIN. MAY REPEAT IN 5 MINUTES IF PAIN NOT RELIEVED CALL 911. 08/22/21  Yes Lavona Agent, MD  oxyCODONE -acetaminophen  (PERCOCET/ROXICET) 5-325 MG tablet Take 1 tablet by mouth every 4 (four) hours as needed for moderate pain (pain score 4-6). 08/15/23 08/20/23 Yes [provider]  potassium chloride  SA (KLOR-CON  M20) 20 MEQ tablet Take 1 tablet (20 mEq total) by mouth daily. 05/22/23  Yes Dettinger, Fonda LABOR, MD  rosuvastatin  (CRESTOR ) 20 MG tablet TAKE 1 TABLET BY MOUTH EVERY DAY 12/08/22  Yes Lavona Agent, MD  sertraline  (ZOLOFT ) 50 MG tablet Take 1 tablet (50 mg total) by mouth daily. 09/05/22  Yes Dettinger, Fonda LABOR, MD    Physical Exam: BP (!) 110/52   Pulse 87   Temp 98.6 F (37 C)   Resp 18   Ht 5' 3 (1.6 m)   Wt 73.5 kg   SpO2 92%   BMI 28.70 kg/m   General: 83 y.o. year-old female well developed well nourished in no acute distress.  Alert and oriented x3. HEENT: NCAT, EOMI Neck: Supple, trachea medial Cardiovascular: Regular rate and rhythm with no rubs or gallops.  No thyromegaly or JVD noted.   No lower extremity edema. 2/4 pulses in all 4 extremities. Respiratory: Clear to auscultation with no wheezes or rales. Good inspiratory effort. Abdomen: Soft, nontender nondistended with normal bowel sounds x4 quadrants. Muskuloskeletal: Swelling of right elbow with a puncture wound caused by a small piece of bone which sticks out of lateral epicondyle area.  Neuro: Right wrist drop.  CN II-XII intact, sensation, reflexes intact Skin: No ulcerative lesions noted or rashes Psychiatry:Mood is appropriate for condition and setting          Labs on Admission:  Basic Metabolic Panel: Recent Labs  Lab 08/16/23 1934  NA 134*  K 3.5  CL 101  CO2 28  GLUCOSE 126*  BUN 19  CREATININE 0.68  CALCIUM  8.2*   Liver Function Tests: Recent Labs  Lab 08/16/23 1934  AST 26  ALT 27  ALKPHOS 50  BILITOT 0.5  PROT 5.5*  ALBUMIN 3.0*   No results for input(s): LIPASE, AMYLASE in the last 168 hours. No results for input(s): AMMONIA in the last 168 hours. CBC: Recent Labs  Lab 08/16/23 1934  WBC 6.3  NEUTROABS 4.0  HGB 10.8*  HCT 33.4*  MCV 91.0  PLT 133*   Cardiac Enzymes: No results for input(s): CKTOTAL, CKMB, CKMBINDEX, TROPONINI in the last 168 hours.  BNP (last 3 results) Recent Labs    03/13/23 1806  BNP 368.0*    ProBNP (last 3 results) Recent Labs    03/16/23 1625  PROBNP 1,136*    CBG: No results for input(s): GLUCAP in the last 168 hours.  Radiological Exams on Admission: CT Elbow Right Wo Contrast Result Date: 08/16/2023 CLINICAL DATA:  Fracture, elbow Pt fell off bed on Friday night. Pt was seen at [facility] and diagnosed with closed fracture of right arm and bleeding noted to elbow area. Pt is on eliquis  for a-fib. EXAM: CT OF THE UPPER RIGHT EXTREMITY WITHOUT CONTRAST TECHNIQUE: Multidetector CT imaging of the upper right extremity was performed according to the standard protocol. RADIATION DOSE REDUCTION: This exam was performed  according to the departmental dose-optimization program which includes automated exposure control, adjustment of the mA and/or kV according to patient size and/or use of iterative reconstruction technique. COMPARISON:  X-ray right elbow 08/16/2023 FINDINGS: Bones/Joint/Cartilage Markedly comminuted and dorsally and laterally displaced supracondylar distal humeral fracture. Likely acute displaced and comminuted avulsion fracture along the olecranon (7:60 4-66). Acute nondisplaced radial head and neck fracture not excluded. Joint effusion. Ligaments Suboptimally assessed by CT. Muscles and Tendons Heterogeneity of the surrounding elbow musculature with query underlying intramuscular hematoma. Soft tissues Subcutaneus soft tissue edema. Subcutaneus soft tissue emphysema. No retained radiopaque foreign body. IMPRESSION: 1. Likely open elbow fracture.  No dislocation. 2. Markedly comminuted and dorsally and laterally displaced supracondylar distal humeral fracture. 3. Likely acute displaced and comminuted avulsion fracture along the olecranon. 4. Acute nondisplaced radial head and neck fracture not excluded. 5. Joint effusion. 6. Heterogeneity of the surrounding elbow musculature with query underlying intramuscular hematoma. Electronically Signed   By: Morgane  Naveau M.D.   On: 08/16/2023 19:44   DG Elbow 2 Views Right Result Date: 08/16/2023 CLINICAL DATA:  Pain, fall out of bed. Elbow fracture at outside facility. EXAM: RIGHT ELBOW - 2 VIEW COMPARISON:  Elbow radiograph yesterday FINDINGS: Comminuted and highly displaced distal humerus fracture. Distal fracture fragments are displaced medially with respect to the humeral shaft. Slight decreased overriding, currently 3.1 cm, previously 4.8 cm. There is no convincing intra-articular involvement on provided views. Generalized subcutaneous edema as well as soft tissue thickening posteriorly. IMPRESSION: Comminuted, displaced and overriding distal humeral fracture.  Minimally improved alignment from yesterday's exam. Electronically Signed   By: Andrea Gasman M.D.   On: 08/16/2023 19:00    EKG: I independently viewed the EKG done and my findings are as followed: EKG was not done in the ED  Assessment/Plan Present on Admission:  Open fracture of distal end of right humerus, unspecified fracture morphology, initial encounter  COPD (chronic obstructive pulmonary disease) (HCC)  Mixed hyperlipidemia  GERD  Principal Problem:   Open fracture of distal end of right humerus, unspecified fracture morphology, initial encounter Active Problems:   Mixed hyperlipidemia   GERD   COPD (chronic obstructive pulmonary disease) (HCC)   Open fracture of distal end of humerus   Radial nerve palsy   Hypoalbuminemia due to protein-calorie malnutrition (HCC)   Atrial fibrillation, chronic (HCC)  Open fracture of distal end of right humerus Radial nerve palsy Continue IV Dilaudid  0.5 mg every 4 hours as needed Continue fall precaution Patient will be placed n.p.o. at midnight Orthopedic surgeon (Dr. Onesimo) was already consulted uncoordinated consults with orthopedic trauma surgeon at [facility] (Dr. Kendal) who will see patient when patient arrives to [facility]  Thrombocytopenia possibly reactive Platelets 133, continue to monitor platelet levels with morning labs  Hypoalbuminemia possibly secondary to moderate protein calorie malnutrition Albumin 3.0, protein supplement will be provided  Chronic atrial fibrillation  Eliquis  was held in anticipation for possible surgical intervention in the morning.  Last dose of Eliquis  was this morning Cardizem  and metoprolol  will be held at this time due to soft BP  GERD Continue Protonix , Pepcid   Acquired hyperlipidemia Continue statin  COPD (not in acute exacerbation) Continue DuoNeb, Singulair    DVT prophylaxis: SCDs  Code Status: Full code  Family Communication: None at bedside  Severity of Illness: The  appropriate patient status for this patient is INPATIENT. Inpatient status is judged to be reasonable and necessary in order to provide the required intensity of service to ensure the patient's  safety. The patient's presenting symptoms, physical exam findings, and initial radiographic and laboratory data in the context of their chronic comorbidities is felt to place them at high risk for further clinical deterioration. Furthermore, it is not anticipated that the patient will be medically stable for discharge from the hospital within 2 midnights of admission.   * I certify that at the point of admission it is my clinical judgment that the patient will require inpatient hospital care spanning beyond 2 midnights from the point of admission due to high intensity of service, high risk for further deterioration and high frequency of surveillance required.*  Author: Posey Maier, DO 08/17/2023 12:36 AM  For on call review [email or web address].

## 2023-08-16 NOTE — ED Provider Notes (Signed)
 [place] EMERGENCY DEPARTMENT AT [facility] Provider Note   CSN: [id number] Arrival date & time: 08/16/23  1749     History  Chief Complaint  Patient presents with   Krystal [name]    Krystal [name] is a 83 y.o. female.   Fall   This patient is an 83 year old female, she is on Eliquis , she had a fall 2 days ago during which time the patient was seen at an outside hospital and diagnosed with a fracture of her right arm.  She was placed in a posterior slab and sent home.  The son brings her back today because she is having difficulty using her right hand and is having significant bleeding around the wound.  She states that her right hand does not feel good because it is slightly numb and she cannot use the hand to grip things    Home Medications Prior to Admission medications   Medication Sig Start Date End Date Taking? Authorizing Provider  ipratropium-albuterol  (DUONEB) 0.5-2.5 (3) MG/3ML SOLN Take 3 mLs by nebulization every 6 (six) hours as needed. Dx:  Asthma 493.90 09/05/22  Yes Dettinger, Fonda LABOR, MD  montelukast  (SINGULAIR ) 10 MG tablet TAKE 1 TABLET BY MOUTH EVERYDAY AT BEDTIME 09/05/22  Yes Dettinger, Fonda LABOR, MD  nitroGLYCERIN  (NITROLINGUAL ) 0.4 MG/SPRAY spray ONE SPRAY UNDER TONGUE AS NEED FOR CHESTPAIN. MAY REPEAT IN 5 MINUTES IF PAIN NOT RELIEVED CALL 911. 08/22/21  Yes Lavona Agent, MD  oxyCODONE -acetaminophen  (PERCOCET/ROXICET) 5-325 MG tablet Take 1 tablet by mouth every 4 (four) hours as needed for moderate pain (pain score 4-6). 08/15/23 08/20/23 Yes [provider]  Calcium  Citrate-Vitamin D  (CALCIUM  CITRATE + D) 315-250 MG-UNIT TABS Take 2 tablets by mouth daily. 12/10/15  Yes Eckard, Tammy, RPH-CPP  Cholecalciferol  (VITAMIN D3) 125 MCG (5000 UT) CAPS Take 5,000 Units by mouth daily.   Yes [provider]  diltiazem  (CARDIZEM  CD) 300 MG 24 hr capsule TAKE 1 CAPSULE BY MOUTH EVERY DAY 09/05/22  Yes Dettinger, Fonda LABOR, MD  ELIQUIS  5 MG TABS  tablet TAKE 1 TABLET BY MOUTH TWICE A DAY 06/23/23  Yes Lavona Agent, MD  esomeprazole  (NEXIUM ) 40 MG capsule Take 1 capsule (40 mg total) by mouth daily. 09/05/22  Yes Dettinger, Fonda LABOR, MD  famotidine  (PEPCID ) 20 MG tablet TAKE ONE TABLET DAILY AFTER SUPPER 09/08/22  Yes Darlean Ozell KATHEE, MD  ferrous sulfate  325 (65 FE) MG tablet Take 1 tablet (325 mg total) by mouth at bedtime. 05/22/23  Yes Dettinger, Fonda LABOR, MD  furosemide  (LASIX ) 20 MG tablet Take 0.5 tablets (10 mg total) by mouth daily. Patient taking differently: Take 20 mg by mouth daily. Take 1/2 tablet by mouth alternating days take one tablet 09/05/22  Yes Dettinger, Fonda LABOR, MD  linaclotide  (LINZESS ) 72 MCG capsule Take 1 capsule (72 mcg total) by mouth daily before breakfast. 05/22/23  Yes Dettinger, Fonda LABOR, MD  metoprolol  tartrate (LOPRESSOR ) 25 MG tablet Take 1 tablet (25 mg total) by mouth 2 (two) times daily. 05/22/23  Yes Dettinger, Fonda LABOR, MD  Multiple Vitamins-Minerals (PRESERVISION/LUTEIN PO) Take 1 capsule by mouth at bedtime.    Yes [provider]  potassium chloride  SA (KLOR-CON  M20) 20 MEQ tablet Take 1 tablet (20 mEq total) by mouth daily. 05/22/23  Yes Dettinger, Fonda LABOR, MD  rosuvastatin  (CRESTOR ) 20 MG tablet TAKE 1 TABLET BY MOUTH EVERY DAY 12/08/22  Yes Lavona Agent, MD  sertraline  (ZOLOFT ) 50 MG tablet Take 1 tablet (50 mg total) by  mouth daily. 09/05/22  Yes Dettinger, Fonda LABOR, MD      Allergies    Actonel [risedronate], Fosamax [alendronate sodium], Mevacor [lovastatin], Miacalcin [calcitonin (salmon)], and Mobic  [meloxicam ]    Review of Systems   Review of Systems  All other systems reviewed and are negative.   Physical Exam Updated Vital Signs BP 107/60   Pulse 91   Temp 98.8 F (37.1 C) (Oral)   Resp 18   Ht 1.6 m (5' 3)   Wt 73.5 kg   SpO2 93%   BMI 28.70 kg/m  Physical Exam Vitals and nursing note reviewed.  Constitutional:      General: She is not in acute distress.     Appearance: She is well-developed.  HENT:     Head: Normocephalic and atraumatic.     Mouth/Throat:     Pharynx: No oropharyngeal exudate.  Eyes:     General: No scleral icterus.       Right eye: No discharge.        Left eye: No discharge.     Conjunctiva/sclera: Conjunctivae normal.     Pupils: Pupils are equal, round, and reactive to light.  Neck:     Thyroid : No thyromegaly.     Vascular: No JVD.  Cardiovascular:     Rate and Rhythm: Normal rate and regular rhythm.     Heart sounds: Normal heart sounds. No murmur heard.    No friction rub. No gallop.  Pulmonary:     Effort: Pulmonary effort is normal. No respiratory distress.     Breath sounds: Normal breath sounds. No wheezing or rales.  Abdominal:     General: Bowel sounds are normal. There is no distension.     Palpations: Abdomen is soft. There is no mass.     Tenderness: There is no abdominal tenderness.  Musculoskeletal:        General: Tenderness present.     Cervical back: Normal range of motion and neck supple.     Right lower leg: No edema.     Left lower leg: No edema.     Comments: The right upper extremity was examined, there is significant swelling around the elbow, there is a puncture wound with a small piece of bone that is sticking out over the lateral epicondyle region.  The patient is having some mild oozing of blood from that area.  Lymphadenopathy:     Cervical: No cervical adenopathy.  Skin:    General: Skin is warm and dry.     Findings: No erythema or rash.  Neurological:     Mental Status: She is alert.     Coordination: Coordination normal.     Comments: Right wrist drop, unable to raise the right thumb, weakened grip  Psychiatric:        Behavior: Behavior normal.     ED Results / Procedures / Treatments   Labs (all labs ordered are listed, but only abnormal results are displayed) Labs Reviewed  CBC WITH DIFFERENTIAL/PLATELET - Abnormal; Notable for the following components:      Result  Value   RBC 3.67 (*)    Hemoglobin 10.8 (*)    HCT 33.4 (*)    Platelets 133 (*)    All other components within normal limits  COMPREHENSIVE METABOLIC PANEL - Abnormal; Notable for the following components:   Sodium 134 (*)    Glucose, Bld 126 (*)    Calcium  8.2 (*)    Total Protein 5.5 (*)  Albumin 3.0 (*)    All other components within normal limits  TYPE AND SCREEN    EKG None  Radiology CT Elbow Right Wo Contrast Result Date: 08/16/2023 CLINICAL DATA:  Fracture, elbow Pt fell off bed on Friday night. Pt was seen at [facility] and diagnosed with closed fracture of right arm and bleeding noted to elbow area. Pt is on eliquis  for a-fib. EXAM: CT OF THE UPPER RIGHT EXTREMITY WITHOUT CONTRAST TECHNIQUE: Multidetector CT imaging of the upper right extremity was performed according to the standard protocol. RADIATION DOSE REDUCTION: This exam was performed according to the departmental dose-optimization program which includes automated exposure control, adjustment of the mA and/or kV according to patient size and/or use of iterative reconstruction technique. COMPARISON:  X-ray right elbow 08/16/2023 FINDINGS: Bones/Joint/Cartilage Markedly comminuted and dorsally and laterally displaced supracondylar distal humeral fracture. Likely acute displaced and comminuted avulsion fracture along the olecranon (7:60 4-66). Acute nondisplaced radial head and neck fracture not excluded. Joint effusion. Ligaments Suboptimally assessed by CT. Muscles and Tendons Heterogeneity of the surrounding elbow musculature with query underlying intramuscular hematoma. Soft tissues Subcutaneus soft tissue edema. Subcutaneus soft tissue emphysema. No retained radiopaque foreign body. IMPRESSION: 1. Likely open elbow fracture.  No dislocation. 2. Markedly comminuted and dorsally and laterally displaced supracondylar distal humeral fracture. 3. Likely acute displaced and comminuted avulsion fracture along the olecranon.  4. Acute nondisplaced radial head and neck fracture not excluded. 5. Joint effusion. 6. Heterogeneity of the surrounding elbow musculature with query underlying intramuscular hematoma. Electronically Signed   By: Morgane  Naveau M.D.   On: 08/16/2023 19:44   DG Elbow 2 Views Right Result Date: 08/16/2023 CLINICAL DATA:  Pain, fall out of bed. Elbow fracture at outside facility. EXAM: RIGHT ELBOW - 2 VIEW COMPARISON:  Elbow radiograph yesterday FINDINGS: Comminuted and highly displaced distal humerus fracture. Distal fracture fragments are displaced medially with respect to the humeral shaft. Slight decreased overriding, currently 3.1 cm, previously 4.8 cm. There is no convincing intra-articular involvement on provided views. Generalized subcutaneous edema as well as soft tissue thickening posteriorly. IMPRESSION: Comminuted, displaced and overriding distal humeral fracture. Minimally improved alignment from yesterday's exam. Electronically Signed   By: Andrea Gasman M.D.   On: 08/16/2023 19:00    Procedures Procedures    Medications Ordered in ED Medications  ceFAZolin  (ANCEF ) IVPB 2g/100 mL premix (0 g Intravenous Stopped 08/16/23 1942)    ED Course/ Medical Decision Making/ A&P                                 Medical Decision Making Amount and/or Complexity of Data Reviewed Labs: ordered. Radiology: ordered.  Risk Prescription drug management. Decision regarding hospitalization.   This patient appears to have an open fracture, she also has a wrist drop suggestive of a nerve injury related to the fracture.  I have viewed the x-rays from the outside hospital which show a severely displaced fracture of the distal humerus and it appears comminuted.  I have discussed the case with Dr. Onesimo of the orthopedic service who will look at the x-rays, this patient will have a repeat x-ray and some labs, I suspect there is a nerve injury and an open fracture.       This patient presents  to the ED for concern of injury to the right arm differential diagnosis includes fracture, open fracture, nerve injury, hematoma    Additional history obtained:  Additional  history obtained from medical record External records from outside source obtained and reviewed including x-rays from outside institutions   Lab Tests:  I Ordered, and personally interpreted labs.  The pertinent results include: CBC and the metabolic panel overall unremarkable   Imaging Studies ordered:  I ordered imaging studies including x-ray of the elbow as well as a CT scan of the elbow at the request of the orthopedist, shows a displaced fracture which is likely an open fracture I independently visualized and interpreted imaging which showed open fracture I agree with the radiologist interpretation   Medicines ordered and prescription drug management:  I ordered medication including Ancef  and pain medicine for open fracture Reevaluation of the patient after these medicines showed that the patient unchanged I have reviewed the patients home medicines and have made adjustments as needed   Problem List / ED Course:  Consultations with Dr. Onesimo of the orthopedic service was come to the bedside to see the patient, he will irrigate the wound and place the patient in a splint, he has requested that the patient be admitted to the hospitalist service and transferred to [facility].  He discussed the case with Dr. Kendal of the trauma orthopedic service, they will coordinate the care of this patient who likely needs operative repair although it may not happen for 1 to 2 days, they feel comfortable with this as the patient appears very stable and they are cleaning the wound at this time.  She is on Eliquis  making the bleeding more robust however she is not hemorrhaging, she is not hypotensive, she does not to be transfused and her last dose of Eliquis  was this morning   Social Determinants of  Health:  Anticoagulated           Final Clinical Impression(s) / ED Diagnoses Final diagnoses:  Open fracture of distal end of right humerus, unspecified fracture morphology, initial encounter    Rx / DC Orders ED Discharge Orders     None         Cleotilde Rogue, MD 08/16/23 2004

## 2023-08-16 NOTE — ED Notes (Signed)
 Orthopedic in to clean, dress and splint right arm.

## 2023-08-16 NOTE — Consult Note (Signed)
 ORTHOPAEDIC CONSULTATION  REQUESTING PHYSICIAN: Cleotilde Rogue, MD  ASSESSMENT AND PLAN: 83 y.o. female with the following: Comminuted right distal humerus fracture, type 1 open  Patient has developed an open fracture, with a comminuted right distal humerus fracture.  5 mm open wound on the lateral aspect of the elbow.  This was irrigated, and the skin was cleaned with Betadine .  She is in a posterior slab splint.  She has received 2 g of Ancef .  Case has been discussed with Dr. Kendal, who specializes in orthopedic trauma.  He is aware of the patient.  He is recommended admission to [facility], with continued antibiotics.  She is to be n.p.o. at midnight, although the definitive plan and timing for surgery has yet to be determined.  Continue to treat her pain as needed.  Physical exam, he has developed a radial nerve palsy.  She has no extension of the wrist, or her fingers.  This was discussed with the patient and her family.  They are aware that this most likely represents a palsy, which can take several months to fully resolve.  - Weight Bearing Status/Activity: Nonweightbearing on the right upper extremity  - Additional recommended labs/tests: None   -VTE Prophylaxis: Please hold Eliquis .  Otherwise, prophylaxis is needed  - Pain control: As needed  - Follow-up plan: To be determined, depending on the timing of surgery  -Procedures: No procedures planned.  Laceration was irrigated with approximately 200 cc of saline, and the skin was cleaned with Betadine .  A posterior slab splint was placed and approximately under degrees of flexion with the elbow.  Chief Complaint: Right elbow pain  HPI: Krystal [name] is a 83 y.o. female with past medical history as listed below.  She is left-hand dominant.  On Friday evening, she states that she fell out of bed.  She had pain in the elbow.  Family brought her to the emergency department the next day.  She was evaluated at [facility].   Radiographs demonstrated a comminuted distal humerus fracture.  Orthopedist on-call was contacted, who was comfortable with outpatient follow-up.  She previously had an appointment scheduled at [facility], and was planning to follow-up there on Tuesday.  She was discharged home.  She was in a posterior slab splint.  Earlier today, she states that she was trying to take a bath.  The splint remained on.  However, family noted that there was some bleeding underneath the splint.  As a result, they presented to [facility] for further evaluation.  Pain has been controlled.  She has some numbness in her fingers, some of which could be attributed to carpal tunnel syndrome.  She was apparently being evaluated, and her follow-up appointment was scheduled to discuss further treatment.  She has the inability to extend her wrist.  She is okay flexing her fingers, as well as the wrist.  Past Medical History:  Diagnosis Date   Anemia    Asthma    Brain tumor (HCC)    1986   Breast cancer (HCC)    Right breast   Cataract    Colon polyps    COPD (chronic obstructive pulmonary disease) (HCC)    Hiatal hernia    Hyperlipidemia    Macular degeneration    Osteoporosis    Pneumonia    PVD (posterior vitreous detachment)    Right knee pain    Shingles rash    Past Surgical History:  Procedure Laterality Date   CATARACT EXTRACTION, BILATERAL  COLONOSCOPY     CORONARY PRESSURE/FFR WITH 3D MAPPING N/A 03/21/2021   Procedure: Coronary Pressure Wire/FFR w/3D Mapping;  Surgeon: Mady Bruckner, MD;  Location: [facility] INVASIVE CV LAB;  Service: Cardiovascular;  Laterality: N/A;   CRANIECTOMY / CRANIOTOMY FOR EXCISION OF BRAIN TUMOR     Left side brain   LEFT HEART CATH AND CORONARY ANGIOGRAPHY N/A 03/21/2021   Procedure: LEFT HEART CATH AND CORONARY ANGIOGRAPHY;  Surgeon: Mady Bruckner, MD;  Location: [facility] INVASIVE CV LAB;  Service: Cardiovascular;  Laterality: N/A;   MASTECTOMY Right    PARTIAL HYSTERECTOMY      POLYPECTOMY     SKIN CANCER EXCISION Left    Under left eye   Social History   Socioeconomic History   Marital status: Widowed    Spouse name: Not on file   Number of children: 1   Years of education: 7th   Highest education level: 7th grade  Occupational History   Occupation: retired  Tobacco Use   Smoking status: Former    Current packs/day: 0.00    Types: Cigarettes    Quit date: 02/09/1961    Years since quitting: 62.5   Smokeless tobacco: Never  Vaping Use   Vaping status: Never Used  Substance and Sexual Activity   Alcohol use: No    Alcohol/week: 0.0 standard drinks of alcohol   Drug use: No   Sexual activity: Not Currently  Other Topics Concern   Not on file  Social History Narrative   Son lives with patient.   Retired from farming.   Highest level of education:  7th   Social Drivers of Health   Financial Resource Strain: Low Risk  (08/13/2022)   Overall Financial Resource Strain (CARDIA)    Difficulty of Paying Living Expenses: Not hard at all  Food Insecurity: No Food Insecurity (08/13/2022)   Hunger Vital Sign    Worried About Running Out of Food in the Last Year: Never true    Ran Out of Food in the Last Year: Never true  Transportation Needs: No Transportation Needs (08/13/2022)   PRAPARE - Administrator, Civil Service (Medical): No    Lack of Transportation (Non-Medical): No  Physical Activity: Sufficiently Active (08/13/2022)   Exercise Vital Sign    Days of Exercise per Week: 5 days    Minutes of Exercise per Session: 30 min  Stress: No Stress Concern Present (08/13/2022)   [employer] of Occupational Health - Occupational Stress Questionnaire    Feeling of Stress : Not at all  Social Connections: Socially Isolated (08/13/2022)   Social Connection and Isolation Panel [NHANES]    Frequency of Communication with Friends and Family: More than three times a week    Frequency of Social Gatherings with Friends and Family: Three times a  week    Attends Religious Services: Never    Active Member of Clubs or Organizations: No    Attends Banker Meetings: Never    Marital Status: Widowed   Family History  Problem Relation Age of Onset   Heart disease Mother 57       Stroke    Dementia Mother    Congestive Heart Failure Mother    Osteoporosis Mother    Stroke Mother    Heart disease Sister    Hypertension Sister    Diabetes Sister    Osteoporosis Sister    Heart disease Brother    Hyperlipidemia Brother    Diabetes Brother    Heart disease  Sister    Hypertension Sister    Diabetes Sister    Diabetes Sister    Hypertension Sister    Diabetes Sister    Hypertension Sister    Diabetes Brother    Hyperlipidemia Brother    Cancer Father        lung   Heart attack Father    Diabetes Brother    Heart disease Brother    Hyperlipidemia Brother    Diabetes Brother    Diabetes Son    Colon cancer Neg Hx    Rectal cancer Neg Hx    Stomach cancer Neg Hx    Esophageal cancer Neg Hx    Allergies  Allergen Reactions   Actonel [Risedronate] Nausea And Vomiting and Other (See Comments)    Throat tightness   Fosamax [Alendronate Sodium] Other (See Comments)    esophagitis   Mevacor [Lovastatin] Other (See Comments)    myalgias   Miacalcin [Calcitonin ([place])] Nausea And Vomiting   Mobic  [Meloxicam ] Hives and Itching    rash   Prior to Admission medications   Medication Sig Start Date End Date Taking? Authorizing Provider  Calcium  Citrate-Vitamin D  (CALCIUM  CITRATE + D) 315-250 MG-UNIT TABS Take 2 tablets by mouth daily. 12/10/15  Yes Eckard, Tammy, RPH-CPP  Cholecalciferol  (VITAMIN D3) 125 MCG (5000 UT) CAPS Take 5,000 Units by mouth daily.   Yes [provider]  diltiazem  (CARDIZEM  CD) 300 MG 24 hr capsule TAKE 1 CAPSULE BY MOUTH EVERY DAY 09/05/22  Yes Dettinger, Fonda LABOR, MD  ELIQUIS  5 MG TABS tablet TAKE 1 TABLET BY MOUTH TWICE A DAY 06/23/23  Yes Lavona Agent, MD  esomeprazole   (NEXIUM ) 40 MG capsule Take 1 capsule (40 mg total) by mouth daily. 09/05/22  Yes Dettinger, Fonda LABOR, MD  famotidine  (PEPCID ) 20 MG tablet TAKE ONE TABLET DAILY AFTER SUPPER 09/08/22  Yes Darlean Ozell NOVAK, MD  ferrous sulfate  325 (65 FE) MG tablet Take 1 tablet (325 mg total) by mouth at bedtime. 05/22/23  Yes Dettinger, Fonda LABOR, MD  furosemide  (LASIX ) 20 MG tablet Take 0.5 tablets (10 mg total) by mouth daily. Patient taking differently: Take 20 mg by mouth daily. Take 1/2 tablet by mouth alternating days take one tablet 09/05/22  Yes Dettinger, Fonda LABOR, MD  ipratropium-albuterol  (DUONEB) 0.5-2.5 (3) MG/3ML SOLN Take 3 mLs by nebulization every 6 (six) hours as needed. Dx:  Asthma 493.90 09/05/22  Yes Dettinger, Fonda LABOR, MD  linaclotide  (LINZESS ) 72 MCG capsule Take 1 capsule (72 mcg total) by mouth daily before breakfast. 05/22/23  Yes Dettinger, Fonda LABOR, MD  metoprolol  tartrate (LOPRESSOR ) 25 MG tablet Take 1 tablet (25 mg total) by mouth 2 (two) times daily. 05/22/23  Yes Dettinger, Fonda LABOR, MD  montelukast  (SINGULAIR ) 10 MG tablet TAKE 1 TABLET BY MOUTH EVERYDAY AT BEDTIME 09/05/22  Yes Dettinger, Fonda LABOR, MD  Multiple Vitamins-Minerals (PRESERVISION/LUTEIN PO) Take 1 capsule by mouth at bedtime.    Yes [provider]  nitroGLYCERIN  (NITROLINGUAL ) 0.4 MG/SPRAY spray ONE SPRAY UNDER TONGUE AS NEED FOR CHESTPAIN. MAY REPEAT IN 5 MINUTES IF PAIN NOT RELIEVED CALL 911. 08/22/21  Yes Lavona Agent, MD  oxyCODONE -acetaminophen  (PERCOCET/ROXICET) 5-325 MG tablet Take 1 tablet by mouth every 4 (four) hours as needed for moderate pain (pain score 4-6). 08/15/23 08/20/23 Yes [provider]  potassium chloride  SA (KLOR-CON  M20) 20 MEQ tablet Take 1 tablet (20 mEq total) by mouth daily. 05/22/23  Yes Dettinger, Fonda LABOR, MD  rosuvastatin  (CRESTOR ) 20 MG tablet  TAKE 1 TABLET BY MOUTH EVERY DAY 12/08/22  Yes Lavona Agent, MD  sertraline  (ZOLOFT ) 50 MG tablet Take 1 tablet (50 mg total) by mouth  daily. 09/05/22  Yes Dettinger, Fonda LABOR, MD   CT Elbow Right Wo Contrast Result Date: 08/16/2023 CLINICAL DATA:  Fracture, elbow Pt fell off bed on Friday night. Pt was seen at [facility] and diagnosed with closed fracture of right arm and bleeding noted to elbow area. Pt is on eliquis  for a-fib. EXAM: CT OF THE UPPER RIGHT EXTREMITY WITHOUT CONTRAST TECHNIQUE: Multidetector CT imaging of the upper right extremity was performed according to the standard protocol. RADIATION DOSE REDUCTION: This exam was performed according to the departmental dose-optimization program which includes automated exposure control, adjustment of the mA and/or kV according to patient size and/or use of iterative reconstruction technique. COMPARISON:  X-ray right elbow 08/16/2023 FINDINGS: Bones/Joint/Cartilage Markedly comminuted and dorsally and laterally displaced supracondylar distal humeral fracture. Likely acute displaced and comminuted avulsion fracture along the olecranon (7:60 4-66). Acute nondisplaced radial head and neck fracture not excluded. Joint effusion. Ligaments Suboptimally assessed by CT. Muscles and Tendons Heterogeneity of the surrounding elbow musculature with query underlying intramuscular hematoma. Soft tissues Subcutaneus soft tissue edema. Subcutaneus soft tissue emphysema. No retained radiopaque foreign body. IMPRESSION: 1. Likely open elbow fracture.  No dislocation. 2. Markedly comminuted and dorsally and laterally displaced supracondylar distal humeral fracture. 3. Likely acute displaced and comminuted avulsion fracture along the olecranon. 4. Acute nondisplaced radial head and neck fracture not excluded. 5. Joint effusion. 6. Heterogeneity of the surrounding elbow musculature with query underlying intramuscular hematoma. Electronically Signed   By: Morgane  Naveau M.D.   On: 08/16/2023 19:44   DG Elbow 2 Views Right Result Date: 08/16/2023 CLINICAL DATA:  Pain, fall out of bed. Elbow fracture at  outside facility. EXAM: RIGHT ELBOW - 2 VIEW COMPARISON:  Elbow radiograph yesterday FINDINGS: Comminuted and highly displaced distal humerus fracture. Distal fracture fragments are displaced medially with respect to the humeral shaft. Slight decreased overriding, currently 3.1 cm, previously 4.8 cm. There is no convincing intra-articular involvement on provided views. Generalized subcutaneous edema as well as soft tissue thickening posteriorly. IMPRESSION: Comminuted, displaced and overriding distal humeral fracture. Minimally improved alignment from yesterday's exam. Electronically Signed   By: Andrea Gasman M.D.   On: 08/16/2023 19:00   Family History Reviewed and non-contributory, no pertinent history of problems with bleeding or anesthesia    Review of Systems No fevers or chills + numbness or tingling No chest pain No shortness of breath No bowel or bladder dysfunction No GI distress No headaches    OBJECTIVE  Vitals:Patient Vitals for the past 8 hrs:  BP Temp Temp src Pulse Resp SpO2 Height Weight  08/16/23 1945 107/60 -- -- 91 18 93 % -- --  08/16/23 1800 (!) 111/51 98.8 F (37.1 C) Oral 78 16 95 % -- --  08/16/23 1758 -- -- -- -- -- -- 5' 3 (1.6 m) 73.5 kg   General: Alert, no acute distress Cardiovascular: Warm extremities noted Respiratory: No cyanosis, no use of accessory musculature GI: No organomegaly, abdomen is soft and non-tender Skin: No lesions in the area of chief complaint other than those listed below in MSK exam.  Neurologic: Some decrease sensation to the index finger.  Sensation intact to the small finger.   Psychiatric: Patient is competent for consent with normal mood and affect Lymphatic: No swelling obvious and reported other than the area involved in the exam below  Extremities  RUE: Diffuse swelling and ecchymosis.  Compartments are soft in both the upper arm, as well as the forearm.  Fingers are warm and well-perfused.  Over the lateral elbow,  with some active bleeding, there is a small laceration, less than 5 mm.  Mild deformity of the elbow.  She is unable to extend her wrist.  She is unable to extend her fingers or her thumb.           Test Results Imaging   X-ray and CT scan of the right elbow demonstrates a comminuted distal humerus fracture, without dislocation of the elbow  Labs cbc Recent Labs    08/16/23 1934  WBC 6.3  HGB 10.8*  HCT 33.4*  PLT 133*     Recent Labs    08/16/23 1934  NA 134*  K 3.5  CL 101  CO2 28  GLUCOSE 126*  BUN 19  CREATININE 0.68  CALCIUM  8.2*

## 2023-08-16 NOTE — ED Triage Notes (Addendum)
 Pt fell off bed on Friday night. Pt was seen at [facility] and diagnosed with closed fracture of right arm and bleeding noted to elbow area. Pt is on eliquis for a-fib.

## 2023-08-17 ENCOUNTER — Encounter (HOSPITAL_COMMUNITY)
Admission: EM | Disposition: A | Discharge status: Skilled Nursing Facility | Payer: Self-pay | Source: Home / Self Care | Attending: Internal Medicine

## 2023-08-17 ENCOUNTER — Inpatient Hospital Stay (HOSPITAL_COMMUNITY): Payer: Medicare HMO

## 2023-08-17 ENCOUNTER — Inpatient Hospital Stay (HOSPITAL_COMMUNITY): Payer: Medicare HMO | Admitting: Anesthesiology

## 2023-08-17 ENCOUNTER — Other Ambulatory Visit: Payer: Self-pay

## 2023-08-17 ENCOUNTER — Encounter (HOSPITAL_COMMUNITY): Payer: Self-pay | Admitting: Internal Medicine

## 2023-08-17 DIAGNOSIS — S42411B Displaced simple supracondylar fracture without intercondylar fracture of right humerus, initial encounter for open fracture: Secondary | ICD-10-CM | POA: Diagnosis not present

## 2023-08-17 DIAGNOSIS — I10 Essential (primary) hypertension: Secondary | ICD-10-CM | POA: Diagnosis not present

## 2023-08-17 DIAGNOSIS — S42409B Unspecified fracture of lower end of unspecified humerus, initial encounter for open fracture: Secondary | ICD-10-CM | POA: Insufficient documentation

## 2023-08-17 DIAGNOSIS — Z87891 Personal history of nicotine dependence: Secondary | ICD-10-CM

## 2023-08-17 DIAGNOSIS — G5630 Lesion of radial nerve, unspecified upper limb: Secondary | ICD-10-CM | POA: Insufficient documentation

## 2023-08-17 DIAGNOSIS — I2510 Atherosclerotic heart disease of native coronary artery without angina pectoris: Secondary | ICD-10-CM | POA: Diagnosis not present

## 2023-08-17 DIAGNOSIS — E46 Unspecified protein-calorie malnutrition: Secondary | ICD-10-CM | POA: Insufficient documentation

## 2023-08-17 DIAGNOSIS — I482 Chronic atrial fibrillation, unspecified: Secondary | ICD-10-CM | POA: Insufficient documentation

## 2023-08-17 DIAGNOSIS — S42401B Unspecified fracture of lower end of right humerus, initial encounter for open fracture: Secondary | ICD-10-CM | POA: Diagnosis not present

## 2023-08-17 DIAGNOSIS — E8809 Other disorders of plasma-protein metabolism, not elsewhere classified: Secondary | ICD-10-CM | POA: Insufficient documentation

## 2023-08-17 HISTORY — PX: ORIF HUMERUS FRACTURE: SHX2126

## 2023-08-17 LAB — COMPREHENSIVE METABOLIC PANEL
ALT: 22 U/L (ref 0–44)
AST: 24 U/L (ref 15–41)
Albumin: 2.8 g/dL — ABNORMAL LOW (ref 3.5–5.0)
Alkaline Phosphatase: 47 U/L (ref 38–126)
Anion gap: 8 (ref 5–15)
BUN: 16 mg/dL (ref 8–23)
CO2: 24 mmol/L (ref 22–32)
Calcium: 8.2 mg/dL — ABNORMAL LOW (ref 8.9–10.3)
Chloride: 98 mmol/L (ref 98–111)
Creatinine, Ser: 0.56 mg/dL (ref 0.44–1.00)
GFR, Estimated: >60 mL/min (ref >60)
Glucose, Bld: 101 mg/dL — ABNORMAL HIGH (ref 70–99)
Potassium: 3.4 mmol/L — ABNORMAL LOW (ref 3.5–5.1)
Sodium: 130 mmol/L — ABNORMAL LOW (ref 135–145)
Total Bilirubin: 0.7 mg/dL (ref 0.0–1.2)
Total Protein: 5.3 g/dL — ABNORMAL LOW (ref 6.5–8.1)

## 2023-08-17 LAB — SURGICAL PCR SCREEN
MRSA, PCR: NEGATIVE
Staphylococcus aureus: NEGATIVE

## 2023-08-17 LAB — CBC
HCT: 31.5 % — ABNORMAL LOW (ref 36.0–46.0)
Hemoglobin: 10.3 g/dL — ABNORMAL LOW (ref 12.0–15.0)
MCH: 29.8 pg (ref 26.0–34.0)
MCHC: 32.7 g/dL (ref 30.0–36.0)
MCV: 91 fL (ref 80.0–100.0)
Platelets: 135 10*3/uL — ABNORMAL LOW (ref 150–400)
RBC: 3.46 MIL/uL — ABNORMAL LOW (ref 3.87–5.11)
RDW: 14.6 % (ref 11.5–15.5)
WBC: 5.3 10*3/uL (ref 4.0–10.5)
nRBC: 0 % (ref 0.0–0.2)

## 2023-08-17 LAB — MAGNESIUM: Magnesium: 1.9 mg/dL (ref 1.7–2.4)

## 2023-08-17 LAB — PHOSPHORUS: Phosphorus: 3 mg/dL (ref 2.5–4.6)

## 2023-08-17 SURGERY — OPEN REDUCTION INTERNAL FIXATION (ORIF) DISTAL HUMERUS FRACTURE
Anesthesia: Regional | Site: Elbow | Laterality: Right

## 2023-08-17 MED ORDER — CEFAZOLIN SODIUM-DEXTROSE 2-4 GM/100ML-% IV SOLN
INTRAVENOUS | Status: AC
  Filled 2023-08-17: qty 100

## 2023-08-17 MED ORDER — DOCUSATE SODIUM 100 MG PO CAPS
100.0000 mg | ORAL_CAPSULE | Freq: Two times a day (BID) | ORAL | Status: DC
  Administered 2023-08-17 – 2023-08-21 (×8): 100 mg via ORAL
  Filled 2023-08-17 (×8): qty 1

## 2023-08-17 MED ORDER — LACTATED RINGERS IV SOLN
INTRAVENOUS | Status: DC
Start: 2023-08-17 — End: 2023-08-17

## 2023-08-17 MED ORDER — METOCLOPRAMIDE HCL 5 MG/ML IJ SOLN: 5.0000 mg | Freq: Three times a day (TID) | INTRAMUSCULAR | Status: DC | PRN

## 2023-08-17 MED ORDER — CHLORHEXIDINE GLUCONATE 0.12 % MT SOLN
OROMUCOSAL | Status: AC
  Administered 2023-08-17: 15 mL via OROMUCOSAL
  Filled 2023-08-17: qty 15

## 2023-08-17 MED ORDER — ACETAMINOPHEN 650 MG RE SUPP: 650.0000 mg | Freq: Four times a day (QID) | RECTAL | Status: DC | PRN

## 2023-08-17 MED ORDER — ACETAMINOPHEN 500 MG PO TABS
ORAL_TABLET | ORAL | Status: AC
  Administered 2023-08-17: 1000 mg via ORAL
  Filled 2023-08-17: qty 2

## 2023-08-17 MED ORDER — METOCLOPRAMIDE HCL 5 MG PO TABS: 5.0000 mg | ORAL_TABLET | Freq: Three times a day (TID) | ORAL | Status: DC | PRN

## 2023-08-17 MED ORDER — 0.9 % SODIUM CHLORIDE (POUR BTL) OPTIME
TOPICAL | Status: DC | PRN
  Administered 2023-08-17: 1000 mL

## 2023-08-17 MED ORDER — ONDANSETRON HCL 4 MG PO TABS: 4.0000 mg | ORAL_TABLET | Freq: Four times a day (QID) | ORAL | Status: DC | PRN

## 2023-08-17 MED ORDER — FAMOTIDINE 20 MG PO TABS
20.0000 mg | ORAL_TABLET | Freq: Every day | ORAL | Status: DC
  Administered 2023-08-17 – 2023-08-20 (×4): 20 mg via ORAL
  Filled 2023-08-17 (×4): qty 1

## 2023-08-17 MED ORDER — PHENYLEPHRINE 80 MCG/ML (10ML) SYRINGE FOR IV PUSH (FOR BLOOD PRESSURE SUPPORT)
PREFILLED_SYRINGE | INTRAVENOUS | Status: DC | PRN
  Administered 2023-08-17: 160 ug via INTRAVENOUS
  Administered 2023-08-17: 80 ug via INTRAVENOUS

## 2023-08-17 MED ORDER — ORAL CARE MOUTH RINSE: 15.0000 mL | Freq: Once | OROMUCOSAL | Status: AC

## 2023-08-17 MED ORDER — CHLORHEXIDINE GLUCONATE 0.12 % MT SOLN: 15.0000 mL | Freq: Once | OROMUCOSAL | Status: AC

## 2023-08-17 MED ORDER — POLYETHYLENE GLYCOL 3350 17 G PO PACK: 17.0000 g | PACK | Freq: Every day | ORAL | Status: DC | PRN

## 2023-08-17 MED ORDER — ONDANSETRON HCL 4 MG/2ML IJ SOLN: 4.0000 mg | Freq: Four times a day (QID) | INTRAMUSCULAR | Status: DC | PRN

## 2023-08-17 MED ORDER — METHOCARBAMOL 500 MG PO TABS
500.0000 mg | ORAL_TABLET | Freq: Four times a day (QID) | ORAL | Status: DC | PRN
  Administered 2023-08-18: 500 mg via ORAL
  Filled 2023-08-17: qty 1

## 2023-08-17 MED ORDER — ROPIVACAINE HCL 5 MG/ML IJ SOLN
INTRAMUSCULAR | Status: DC | PRN
  Administered 2023-08-17: 30 mL via PERINEURAL

## 2023-08-17 MED ORDER — PANTOPRAZOLE SODIUM 40 MG PO TBEC
80.0000 mg | DELAYED_RELEASE_TABLET | Freq: Every day | ORAL | Status: DC
  Administered 2023-08-18 – 2023-08-21 (×4): 80 mg via ORAL
  Filled 2023-08-17 (×4): qty 2

## 2023-08-17 MED ORDER — ROSUVASTATIN CALCIUM 20 MG PO TABS
20.0000 mg | ORAL_TABLET | Freq: Every day | ORAL | Status: DC
  Administered 2023-08-18 – 2023-08-21 (×4): 20 mg via ORAL
  Filled 2023-08-17 (×4): qty 1

## 2023-08-17 MED ORDER — FENTANYL CITRATE (PF) 100 MCG/2ML IJ SOLN: 25.0000 ug | INTRAMUSCULAR | Status: DC | PRN

## 2023-08-17 MED ORDER — ENSURE ENLIVE PO LIQD
237.0000 mL | Freq: Two times a day (BID) | ORAL | Status: DC
  Administered 2023-08-18 – 2023-08-21 (×7): 237 mL via ORAL
  Filled 2023-08-17 (×6): qty 237

## 2023-08-17 MED ORDER — OXYCODONE HCL 5 MG PO TABS: 5.0000 mg | ORAL_TABLET | ORAL | Status: DC | PRN

## 2023-08-17 MED ORDER — POTASSIUM CHLORIDE IN NACL 40-0.9 MEQ/L-% IV SOLN
INTRAVENOUS | Status: AC
  Filled 2023-08-17 (×3): qty 1000

## 2023-08-17 MED ORDER — PHENYLEPHRINE HCL-NACL 20-0.9 MG/250ML-% IV SOLN
INTRAVENOUS | Status: DC | PRN
  Administered 2023-08-17: 30 ug/min via INTRAVENOUS

## 2023-08-17 MED ORDER — PROPOFOL 10 MG/ML IV BOLUS
INTRAVENOUS | Status: DC | PRN
  Administered 2023-08-17: 100 mg via INTRAVENOUS

## 2023-08-17 MED ORDER — HYDROMORPHONE HCL 1 MG/ML IJ SOLN
0.5000 mg | INTRAMUSCULAR | Status: DC | PRN
  Administered 2023-08-17 (×2): 0.5 mg via INTRAVENOUS
  Filled 2023-08-17 (×2): qty 0.5

## 2023-08-17 MED ORDER — DEXAMETHASONE SODIUM PHOSPHATE 10 MG/ML IJ SOLN
INTRAMUSCULAR | Status: DC | PRN
  Administered 2023-08-17: 4 mg via INTRAVENOUS

## 2023-08-17 MED ORDER — FENTANYL CITRATE (PF) 250 MCG/5ML IJ SOLN
INTRAMUSCULAR | Status: AC
  Filled 2023-08-17: qty 5

## 2023-08-17 MED ORDER — ONDANSETRON HCL 4 MG/2ML IJ SOLN: 4.0000 mg | Freq: Once | INTRAMUSCULAR | Status: DC | PRN

## 2023-08-17 MED ORDER — FENTANYL CITRATE (PF) 100 MCG/2ML IJ SOLN
INTRAMUSCULAR | Status: AC
  Filled 2023-08-17: qty 2

## 2023-08-17 MED ORDER — SODIUM CHLORIDE 0.9 % IV SOLN: INTRAVENOUS | Status: DC | PRN

## 2023-08-17 MED ORDER — ACETAMINOPHEN 325 MG PO TABS: 650.0000 mg | ORAL_TABLET | Freq: Four times a day (QID) | ORAL | Status: DC | PRN

## 2023-08-17 MED ORDER — SUGAMMADEX SODIUM 200 MG/2ML IV SOLN
INTRAVENOUS | Status: DC | PRN
  Administered 2023-08-17: 200 mg via INTRAVENOUS

## 2023-08-17 MED ORDER — CEFAZOLIN SODIUM-DEXTROSE 2-3 GM-%(50ML) IV SOLR
INTRAVENOUS | Status: DC | PRN
  Administered 2023-08-17: 2 g via INTRAVENOUS

## 2023-08-17 MED ORDER — VANCOMYCIN HCL 1000 MG IV SOLR
INTRAVENOUS | Status: DC | PRN
  Administered 2023-08-17: 1000 mg via TOPICAL

## 2023-08-17 MED ORDER — IPRATROPIUM-ALBUTEROL 0.5-2.5 (3) MG/3ML IN SOLN: 3.0000 mL | Freq: Four times a day (QID) | RESPIRATORY_TRACT | Status: DC | PRN

## 2023-08-17 MED ORDER — MONTELUKAST SODIUM 10 MG PO TABS
10.0000 mg | ORAL_TABLET | Freq: Every day | ORAL | Status: DC
  Administered 2023-08-17 – 2023-08-20 (×4): 10 mg via ORAL
  Filled 2023-08-17 (×4): qty 1

## 2023-08-17 MED ORDER — FENTANYL CITRATE (PF) 100 MCG/2ML IJ SOLN
INTRAMUSCULAR | Status: DC | PRN
  Administered 2023-08-17: 25 ug via INTRAVENOUS

## 2023-08-17 MED ORDER — ONDANSETRON HCL 4 MG/2ML IJ SOLN
INTRAMUSCULAR | Status: DC | PRN
  Administered 2023-08-17: 4 mg via INTRAVENOUS

## 2023-08-17 MED ORDER — CEFAZOLIN SODIUM-DEXTROSE 2-4 GM/100ML-% IV SOLN
2.0000 g | Freq: Three times a day (TID) | INTRAVENOUS | Status: AC
  Administered 2023-08-17 – 2023-08-18 (×3): 2 g via INTRAVENOUS
  Filled 2023-08-17 (×3): qty 100

## 2023-08-17 MED ORDER — ROCURONIUM BROMIDE 10 MG/ML (PF) SYRINGE
PREFILLED_SYRINGE | INTRAVENOUS | Status: DC | PRN
  Administered 2023-08-17: 50 mg via INTRAVENOUS

## 2023-08-17 MED ORDER — ACETAMINOPHEN 500 MG PO TABS: 1000.0000 mg | ORAL_TABLET | Freq: Once | ORAL | Status: AC

## 2023-08-17 MED ORDER — HYDROMORPHONE HCL 1 MG/ML IJ SOLN: 0.5000 mg | INTRAMUSCULAR | Status: DC | PRN

## 2023-08-17 MED ORDER — METHOCARBAMOL 1000 MG/10ML IJ SOLN: 500.0000 mg | Freq: Four times a day (QID) | INTRAMUSCULAR | Status: DC | PRN

## 2023-08-17 MED ORDER — DILTIAZEM HCL ER COATED BEADS 180 MG PO CP24
300.0000 mg | ORAL_CAPSULE | Freq: Every day | ORAL | Status: DC
  Administered 2023-08-17 – 2023-08-19 (×3): 300 mg via ORAL
  Filled 2023-08-17 (×3): qty 1

## 2023-08-17 MED ORDER — DEXAMETHASONE SODIUM PHOSPHATE 10 MG/ML IJ SOLN
INTRAMUSCULAR | Status: DC | PRN
  Administered 2023-08-17: 10 mg

## 2023-08-17 SURGICAL SUPPLY — 81 items
BAG COUNTER SPONGE SURGICOUNT (BAG) ×1 IMPLANT
BIT DRILL QC 2.0 SHORT EVOS SM (DRILL) IMPLANT
BIT DRILL QC 2.5MM SHRT EVO SM (DRILL) IMPLANT
BLADE AVERAGE 25X9 (BLADE) IMPLANT
BNDG COHESIVE 4X5 TAN STRL (GAUZE/BANDAGES/DRESSINGS) ×1 IMPLANT
BNDG ELASTIC 3INX 5YD STR LF (GAUZE/BANDAGES/DRESSINGS) IMPLANT
BNDG ELASTIC 4INX 5YD STR LF (GAUZE/BANDAGES/DRESSINGS) IMPLANT
BNDG ESMARK 4X9 LF (GAUZE/BANDAGES/DRESSINGS) IMPLANT
BNDG GAUZE DERMACEA FLUFF 4 (GAUZE/BANDAGES/DRESSINGS) ×2 IMPLANT
BRUSH SCRUB EZ PLAIN DRY (MISCELLANEOUS) ×2 IMPLANT
CLEANER TIP ELECTROSURG 2X2 (MISCELLANEOUS) ×1 IMPLANT
CORD BIPOLAR FORCEPS 12FT (ELECTRODE) ×1 IMPLANT
COVER SURGICAL LIGHT HANDLE (MISCELLANEOUS) ×1 IMPLANT
DRAIN PENROSE 12X.25 LTX STRL (MISCELLANEOUS) IMPLANT
DRAPE C-ARM 42X72 X-RAY (DRAPES) ×1 IMPLANT
DRAPE C-ARMOR (DRAPES) IMPLANT
DRAPE HALF SHEET 40X57 (DRAPES) ×1 IMPLANT
DRAPE INCISE IOBAN 66X45 STRL (DRAPES) IMPLANT
DRAPE SURG ORHT 6 SPLT 77X108 (DRAPES) ×1 IMPLANT
DRAPE U-SHAPE 47X51 STRL (DRAPES) ×1 IMPLANT
DRESSING MEPILEX FLEX 4X4 (GAUZE/BANDAGES/DRESSINGS) IMPLANT
DRILL QC 2.0 SHORT EVOS SM (DRILL) ×1
DRILL QC 2.5MM SHORT EVOS SM (DRILL) ×1
DRSG ADAPTIC 3X8 NADH LF (GAUZE/BANDAGES/DRESSINGS) ×1 IMPLANT
DRSG MEPILEX FLEX 4X4 (GAUZE/BANDAGES/DRESSINGS) ×1
DRSG MEPILEX POST OP 4X12 (GAUZE/BANDAGES/DRESSINGS) IMPLANT
ELECT REM PT RETURN 9FT ADLT (ELECTROSURGICAL) ×1
ELECTRODE REM PT RTRN 9FT ADLT (ELECTROSURGICAL) ×1 IMPLANT
EVACUATOR 1/8 PVC DRAIN (DRAIN) IMPLANT
GAUZE PAD ABD 8X10 STRL (GAUZE/BANDAGES/DRESSINGS) ×1 IMPLANT
GAUZE SPONGE 4X4 12PLY STRL (GAUZE/BANDAGES/DRESSINGS) ×2 IMPLANT
GLOVE BIO SURGEON STRL SZ 6.5 (GLOVE) ×3 IMPLANT
GLOVE BIO SURGEON STRL SZ7.5 (GLOVE) ×3 IMPLANT
GLOVE BIOGEL PI IND STRL 6.5 (GLOVE) ×1 IMPLANT
GLOVE BIOGEL PI IND STRL 7.5 (GLOVE) ×1 IMPLANT
GOWN STRL REUS W/ TWL LRG LVL3 (GOWN DISPOSABLE) ×3 IMPLANT
GOWN STRL REUS W/ TWL XL LVL3 (GOWN DISPOSABLE) ×1 IMPLANT
K-WIRE FX150X1.6XTROC PNT (WIRE) ×4
KIT BASIN OR (CUSTOM PROCEDURE TRAY) ×1 IMPLANT
KIT TURNOVER KIT B (KITS) ×1 IMPLANT
KWIRE FX150X1.6XTROC PNT (WIRE) IMPLANT
LOOP VASCLR MAXI BLUE 18IN ST (MISCELLANEOUS) ×1 IMPLANT
LOOPS VASCLR MAXI BLUE 18IN ST (MISCELLANEOUS) ×1 IMPLANT
MANIFOLD NEPTUNE II (INSTRUMENTS) ×1 IMPLANT
NDL HYPO 25X1 1.5 SAFETY (NEEDLE) IMPLANT
NEEDLE HYPO 25X1 1.5 SAFETY (NEEDLE) IMPLANT
NS IRRIG 1000ML POUR BTL (IV SOLUTION) ×1 IMPLANT
PACK ORTHO EXTREMITY (CUSTOM PROCEDURE TRAY) ×1 IMPLANT
PAD ARMBOARD 7.5X6 YLW CONV (MISCELLANEOUS) ×2 IMPLANT
PAD CAST 3X4 CTTN HI CHSV (CAST SUPPLIES) IMPLANT
PAD CAST 4YDX4 CTTN HI CHSV (CAST SUPPLIES) IMPLANT
PLATE HUM EVOS 5H R 2.7X102 (Plate) IMPLANT
PLATE HUM EVOS 6H R 2.7X85 (Plate) IMPLANT
SCREW CORT 2.7X17 T8 ST EVOS (Screw) IMPLANT
SCREW CORT 2.7X20 T8 ST EVOS (Screw) IMPLANT
SCREW CORT 3.5X14 ST EVOS (Screw) IMPLANT
SCREW CORT 3.5X20 ST EVOS (Screw) IMPLANT
SCREW CORT 3.5X22 ST EVOS (Screw) IMPLANT
SCREW EVOS 2.7 X 50 LCK T8 S-T (Screw) IMPLANT
SCREW LOCK EVOS 2.7X44 (Screw) IMPLANT
SCREW LOCK ST EVOS 2.7X20 (Screw) IMPLANT
SCREW LOCK ST EVOS 2.7X22 (Screw) IMPLANT
SCREW LOCK ST EVOS 2.7X24 (Screw) IMPLANT
SLING ARM FOAM STRAP LRG (SOFTGOODS) IMPLANT
SPONGE T-LAP 18X18 ~~LOC~~+RFID (SPONGE) IMPLANT
STAPLER VISISTAT 35W (STAPLE) IMPLANT
STOCKINETTE IMPERVIOUS 9X36 MD (GAUZE/BANDAGES/DRESSINGS) ×1 IMPLANT
SUCTION TUBE FRAZIER 10FR DISP (SUCTIONS) ×1 IMPLANT
SUT ETHILON 3 0 PS 1 (SUTURE) ×2 IMPLANT
SUT MON AB 2-0 CT1 36 (SUTURE) IMPLANT
SUT VIC AB 0 CT1 27XBRD ANBCTR (SUTURE) ×2 IMPLANT
SUT VIC AB 2-0 CT1 TAPERPNT 27 (SUTURE) ×2 IMPLANT
SYR 5ML LL (SYRINGE) IMPLANT
SYR CONTROL 10ML LL (SYRINGE) ×1 IMPLANT
TIE VASCULAR MAXI BLUE 18IN ST (MISCELLANEOUS) ×1
TOWEL GREEN STERILE (TOWEL DISPOSABLE) ×1 IMPLANT
TOWEL GREEN STERILE FF (TOWEL DISPOSABLE) ×1 IMPLANT
TRAY FOLEY MTR SLVR 16FR STAT (SET/KITS/TRAYS/PACK) IMPLANT
VASCULAR TIE MAXI BLUE 18IN ST (MISCELLANEOUS) ×1
WATER STERILE IRR 1000ML POUR (IV SOLUTION) ×1 IMPLANT
YANKAUER SUCT BULB TIP NO VENT (SUCTIONS) IMPLANT

## 2023-08-17 NOTE — Progress Notes (Signed)
 PROGRESS NOTE    [name]  FMW:[id number] DOB: November 29, 1940 DOA: 08/16/2023 PCP: Dettinger, Fonda LABOR, MD   Brief Narrative:    Krystal [name] is a 83 y.o. female with medical history significant of atrial fibrillation on Eliquis , GERD, hyperlipidemia, COPD who presents to the emergency department due to right humeral open fracture.  Patient awaiting transfer to [facility] for further evaluation and management by orthopedics.  Assessment & Plan:   Principal Problem:   Open fracture of distal end of right humerus, unspecified fracture morphology, initial encounter Active Problems:   Mixed hyperlipidemia   GERD   COPD (chronic obstructive pulmonary disease) (HCC)   Open fracture of distal end of humerus   Radial nerve palsy   Hypoalbuminemia due to protein-calorie malnutrition (HCC)   Atrial fibrillation, chronic (HCC)  Assessment and Plan:   Open fracture of distal end of right humerus Radial nerve palsy Continue IV Dilaudid  0.5 mg every 4 hours as needed Continue fall precaution Patient will be placed n.p.o. at midnight Orthopedic surgeon (Dr. Onesimo) was already consulted uncoordinated consults with orthopedic trauma surgeon at [facility] (Dr. Kendal) who will see patient when patient arrives to [facility]  Mild hypokalemia Replete IV and reevaluate in a.m.  Hyponatremia Started on IV normal saline Recheck in a.m.   Thrombocytopenia possibly reactive Platelets 135, continue to monitor platelet levels with morning labs   Hypoalbuminemia possibly secondary to moderate protein calorie malnutrition Albumin 3.0, protein supplement will be provided   Chronic atrial fibrillation  Eliquis  was held in anticipation for possible surgical intervention in the morning.  Last dose of Eliquis  was this morning Cardizem  and metoprolol  will be held at this time due to soft BP   GERD Continue Protonix , Pepcid    Acquired hyperlipidemia Continue statin   COPD (not in acute  exacerbation) Continue DuoNeb, Singulair     DVT prophylaxis: SCDs, Eliquis  currently held Code Status: Full Family Communication: None at bedside Disposition Plan: Transfer to [facility] for orthopedic evaluation Status is: Inpatient Remains inpatient appropriate because: Need for inpatient IV medications   Consultants:  Orthopedics-Dr. Kendal  Procedures:  None  Antimicrobials:  Anti-infectives (From admission, onward)    Start     Dose/Rate Route Frequency Ordered Stop   08/16/23 1845  ceFAZolin  (ANCEF ) IVPB 2g/100 mL premix        2 g 200 mL/hr over 30 Minutes Intravenous Every 8 hours 08/16/23 1830 08/17/23 1844      Subjective: Patient seen and evaluated today with no new acute complaints or concerns aside from mild pain to her right arm. No acute concerns or events noted overnight.  Objective: Vitals:   08/17/23 0400 08/17/23 0500 08/17/23 0700 08/17/23 0800  BP: (!) 107/55 104/73 (!) 112/55 (!) 109/54  Pulse: 86 96 89 94  Resp: 18 18 16    Temp: 98.4 F (36.9 C)     TempSrc:      SpO2: 92% 90% 94% 93%  Weight:      Height:       No intake or output data in the 24 hours ending 08/17/23 0850 Filed Weights   08/16/23 1758  Weight: 73.5 kg    Examination:  General exam: Appears calm and comfortable  Respiratory system: Clear to auscultation. Respiratory effort normal. Cardiovascular system: S1 & S2 heard, RRR.  Gastrointestinal system: Abdomen is soft Central nervous system: Alert and awake Extremities: Right arm in dressings C/D/I Skin: No significant lesions noted Psychiatry: Flat affect.    Data Reviewed:  I have personally reviewed following labs and imaging studies  CBC: Recent Labs  Lab 08/16/23 1934 08/17/23 0508  WBC 6.3 5.3  NEUTROABS 4.0  --   HGB 10.8* 10.3*  HCT 33.4* 31.5*  MCV 91.0 91.0  PLT 133* 135*   Basic Metabolic Panel: Recent Labs  Lab 08/16/23 1934 08/17/23 0508  NA 134* 130*  K 3.5 3.4*  CL 101 98  CO2 28 24   GLUCOSE 126* 101*  BUN 19 16  CREATININE 0.68 0.56  CALCIUM  8.2* 8.2*  MG  --  1.9  PHOS  --  3.0   GFR: Estimated Creatinine Clearance: 50.2 mL/min (by C-G formula based on SCr of 0.56 mg/dL). Liver Function Tests: Recent Labs  Lab 08/16/23 1934 08/17/23 0508  AST 26 24  ALT 27 22  ALKPHOS 50 47  BILITOT 0.5 0.7  PROT 5.5* 5.3*  ALBUMIN 3.0* 2.8*   No results for input(s): LIPASE, AMYLASE in the last 168 hours. No results for input(s): AMMONIA in the last 168 hours. Coagulation Profile: No results for input(s): INR, PROTIME in the last 168 hours. Cardiac Enzymes: No results for input(s): CKTOTAL, CKMB, CKMBINDEX, TROPONINI in the last 168 hours. BNP (last 3 results) Recent Labs    03/16/23 1625  PROBNP 1,136*   HbA1C: No results for input(s): HGBA1C in the last 72 hours. CBG: No results for input(s): GLUCAP in the last 168 hours. Lipid Profile: No results for input(s): CHOL, HDL, LDLCALC, TRIG, CHOLHDL, LDLDIRECT in the last 72 hours. Thyroid  Function Tests: No results for input(s): TSH, T4TOTAL, FREET4, T3FREE, THYROIDAB in the last 72 hours. Anemia Panel: No results for input(s): VITAMINB12, FOLATE, FERRITIN, TIBC, IRON, RETICCTPCT in the last 72 hours. Sepsis Labs: No results for input(s): PROCALCITON, LATICACIDVEN in the last 168 hours.  No results found for this or any previous visit (from the past 240 hours).       Radiology Studies: CT Elbow Right Wo Contrast Result Date: 08/16/2023 CLINICAL DATA:  Fracture, elbow Pt fell off bed on Friday night. Pt was seen at [facility] and diagnosed with closed fracture of right arm and bleeding noted to elbow area. Pt is on eliquis  for a-fib. EXAM: CT OF THE UPPER RIGHT EXTREMITY WITHOUT CONTRAST TECHNIQUE: Multidetector CT imaging of the upper right extremity was performed according to the standard protocol. RADIATION DOSE REDUCTION: This exam was  performed according to the departmental dose-optimization program which includes automated exposure control, adjustment of the mA and/or kV according to patient size and/or use of iterative reconstruction technique. COMPARISON:  X-ray right elbow 08/16/2023 FINDINGS: Bones/Joint/Cartilage Markedly comminuted and dorsally and laterally displaced supracondylar distal humeral fracture. Likely acute displaced and comminuted avulsion fracture along the olecranon (7:60 4-66). Acute nondisplaced radial head and neck fracture not excluded. Joint effusion. Ligaments Suboptimally assessed by CT. Muscles and Tendons Heterogeneity of the surrounding elbow musculature with query underlying intramuscular hematoma. Soft tissues Subcutaneus soft tissue edema. Subcutaneus soft tissue emphysema. No retained radiopaque foreign body. IMPRESSION: 1. Likely open elbow fracture.  No dislocation. 2. Markedly comminuted and dorsally and laterally displaced supracondylar distal humeral fracture. 3. Likely acute displaced and comminuted avulsion fracture along the olecranon. 4. Acute nondisplaced radial head and neck fracture not excluded. 5. Joint effusion. 6. Heterogeneity of the surrounding elbow musculature with query underlying intramuscular hematoma. Electronically Signed   By: Morgane  Naveau M.D.   On: 08/16/2023 19:44   DG Elbow 2 Views Right Result Date: 08/16/2023 CLINICAL DATA:  Pain, fall out of  bed. Elbow fracture at outside facility. EXAM: RIGHT ELBOW - 2 VIEW COMPARISON:  Elbow radiograph yesterday FINDINGS: Comminuted and highly displaced distal humerus fracture. Distal fracture fragments are displaced medially with respect to the humeral shaft. Slight decreased overriding, currently 3.1 cm, previously 4.8 cm. There is no convincing intra-articular involvement on provided views. Generalized subcutaneous edema as well as soft tissue thickening posteriorly. IMPRESSION: Comminuted, displaced and overriding distal humeral  fracture. Minimally improved alignment from yesterday's exam. Electronically Signed   By: Andrea Gasman M.D.   On: 08/16/2023 19:00        Scheduled Meds:  famotidine   20 mg Oral QHS   feeding supplement  237 mL Oral BID BM   montelukast   10 mg Oral QHS   pantoprazole   80 mg Oral Q1200   rosuvastatin   20 mg Oral Daily   Continuous Infusions:  0.9 % NaCl with KCl 40 mEq / L      ceFAZolin  (ANCEF ) IV Stopped (08/17/23 0300)     LOS: 1 day    Time spent: 55 minutes    Krystal [name] JONETTA Fairly, DO [facility]  If 7PM-7AM, please contact night-coverage [email or web address] 08/17/2023, 8:50 AM

## 2023-08-17 NOTE — Anesthesia Preprocedure Evaluation (Addendum)
 Anesthesia Evaluation  Patient identified by MRN, date of birth, ID band Patient awake    Reviewed: Allergy & Precautions, NPO status , Patient's Chart, lab work & pertinent test results, reviewed documented beta blocker date and time   Airway Mallampati: IV  TM Distance: >3 FB Neck ROM: Full    Dental  (+) Edentulous Upper, Edentulous Lower   Pulmonary asthma , COPD,  COPD inhaler, former smoker   Pulmonary exam normal breath sounds clear to auscultation       Cardiovascular hypertension (112/57 preop), Pt. on medications and Pt. on home beta blockers pulmonary hypertension (mild pHTN on echo 2023)+ CAD  Normal cardiovascular exam+ dysrhythmias (eliquis  LD yesterday) Atrial Fibrillation + Valvular Problems/Murmurs (mild MR/AI/AS, mod TR) AS, AI and MR  Rhythm:Regular Rate:Normal  Echo 2023 1. Left ventricular ejection fraction, by estimation, is 60 to 65%. The  left ventricle has normal function. The left ventricle has no regional  wall motion abnormalities. Left ventricular diastolic parameters were  normal.   2. Right ventricular systolic function is normal. The right ventricular  size is normal. There is mildly elevated pulmonary artery systolic  pressure.   3. The pericardial effusion is posterior to the left ventricle.   4. The mitral valve is degenerative. Mild mitral valve regurgitation. No  evidence of mitral stenosis. Moderate mitral annular calcification.   5. Tricuspid valve regurgitation is moderate.   6. The aortic valve is tricuspid. There is moderate calcification of the  aortic valve. There is moderate thickening of the aortic valve. Aortic  valve regurgitation is mild. Mild aortic valve stenosis.   7. The inferior vena cava is normal in size with greater than 50%  respiratory variability, suggesting right atrial pressure of 3 mmHg.      Neuro/Psych  PSYCHIATRIC DISORDERS Anxiety Depression    negative  neurological ROS     GI/Hepatic Neg liver ROS, hiatal hernia,GERD  Controlled,,  Endo/Other  negative endocrine ROS    Renal/GU negative Renal ROS  negative genitourinary   Musculoskeletal negative musculoskeletal ROS (+)    Abdominal  (+) + obese  Peds  Hematology  (+) Blood dyscrasia, anemia Hb 10.3, plt 135   Anesthesia Other Findings   Reproductive/Obstetrics negative OB ROS                             Anesthesia Physical Anesthesia Plan  ASA: 3  Anesthesia Plan: General and Regional   Post-op Pain Management: Tylenol  PO (pre-op)* and Regional block*   Induction: Intravenous  PONV Risk Score and Plan: 3 and Ondansetron , Dexamethasone  and Treatment may vary due to age or medical condition  Airway Management Planned: Oral ETT  Additional Equipment: None  Intra-op Plan:   Post-operative Plan: Extubation in OR  Informed Consent: I have reviewed the patients History and Physical, chart, labs and discussed the procedure including the risks, benefits and alternatives for the proposed anesthesia with the patient or authorized representative who has indicated his/her understanding and acceptance.     Dental advisory given  Plan Discussed with: CRNA  Anesthesia Plan Comments:         Anesthesia Quick Evaluation

## 2023-08-17 NOTE — Progress Notes (Signed)
 Orthopedic Tech Progress Note Patient Details:  Krystal [name] Jul 01, 1941 [id number]  Patient ID: [name], female   DOB: 1940/10/01, 83 y.o.   MRN: [id number] Patient doesn't meet criteria for ohf. Pt must be under 70 to get ohf. [name], [name] 08/17/2023, 8:39 PM

## 2023-08-17 NOTE — Transfer of Care (Signed)
 Immediate Anesthesia Transfer of Care Note  Patient: Krystal [name]  Procedure(s) Performed: OPEN REDUCTION INTERNAL FIXATION (ORIF) DISTAL HUMERUS FRACTURE (Right: Elbow)  Patient Location: PACU  Anesthesia Type:General  Level of Consciousness: awake and oriented  Airway & Oxygen Therapy: Patient Spontanous Breathing  Post-op Assessment: Report given to RN and Post -op Vital signs reviewed and stable  Post vital signs: Reviewed and stable  Last Vitals:  Vitals Value Taken Time  BP 114/57 08/17/23 1822  Temp    Pulse 110 08/17/23 1825  Resp 15 08/17/23 1825  SpO2 85 % 08/17/23 1825  Vitals shown include unfiled device data.  Last Pain:  Vitals:   08/17/23 1442  TempSrc: Temporal  PainSc: 0-No pain         Complications: No notable events documented.

## 2023-08-17 NOTE — Anesthesia Postprocedure Evaluation (Signed)
 Anesthesia Post Note  Patient: Krystal [name]  Procedure(s) Performed: OPEN REDUCTION INTERNAL FIXATION (ORIF) DISTAL HUMERUS FRACTURE (Right: Elbow)     Patient location during evaluation: PACU Anesthesia Type: General and Regional Level of consciousness: awake and alert Pain management: pain level controlled Vital Signs Assessment: post-procedure vital signs reviewed and stable Respiratory status: spontaneous breathing, nonlabored ventilation, respiratory function stable and patient connected to nasal cannula oxygen Cardiovascular status: blood pressure returned to baseline and stable Postop Assessment: no apparent nausea or vomiting Anesthetic complications: no   No notable events documented.  Last Vitals:  Vitals:   08/17/23 2003 08/17/23 2139  BP: 101/60 101/60  Pulse: (!) 108   Resp: 16   Temp: 36.9 C   SpO2: 94%     Last Pain:  Vitals:   08/17/23 2012  TempSrc:   PainSc: 0-No pain                 Lynwood MARLA Cornea

## 2023-08-17 NOTE — Anesthesia Procedure Notes (Signed)
 Procedure Name: Intubation Date/Time: 08/17/2023 4:28 PM  Performed by: Lockie Flesher, CRNAPre-anesthesia Checklist: Patient identified, Emergency Drugs available, Suction available and Patient being monitored Patient Re-evaluated:Patient Re-evaluated prior to induction Oxygen Delivery Method: Circle System Utilized Preoxygenation: Pre-oxygenation with 100% oxygen Induction Type: IV induction Ventilation: Mask ventilation without difficulty Laryngoscope Size: Mac and 3 Grade View: Grade I Tube type: Oral Tube size: 7.0 mm Number of attempts: 1 Airway Equipment and Method: Stylet and Oral airway Placement Confirmation: ETT inserted through vocal cords under direct vision, positive ETCO2 and breath sounds checked- equal and bilateral Secured at: 22 cm Tube secured with: Tape Dental Injury: Teeth and Oropharynx as per pre-operative assessment

## 2023-08-17 NOTE — Anesthesia Procedure Notes (Signed)
 Anesthesia Regional Block: Supraclavicular block   Pre-Anesthetic Checklist: , timeout performed,  Correct Patient, Correct Site, Correct Laterality,  Correct Procedure, Correct Position, site marked,  Risks and benefits discussed,  Surgical consent,  Pre-op evaluation,  At surgeon's request and post-op pain management  Laterality: Right  Prep: Maximum Sterile Barrier Precautions used, chloraprep       Needles:  Injection technique: Single-shot  Needle Type: Echogenic Stimulator Needle     Needle Length: 9cm  Needle Gauge: 22     Additional Needles:   Procedures:,,,, ultrasound used (permanent image in chart),,    Narrative:  Start time: 08/17/2023 3:05 PM End time: 08/17/2023 3:10 PM Injection made incrementally with aspirations every 5 mL.  Performed by: Personally  Anesthesiologist: Merla Almarie HERO, DO  Additional Notes: Monitors applied. No increased pain on injection. No increased resistance to injection. Injection made in 5cc increments. Good needle visualization. Patient tolerated procedure well.

## 2023-08-17 NOTE — Consult Note (Signed)
 Orthopaedic Trauma Service (OTS) Consult   Patient ID: DEVOTA VIRUET MRN: [id number] DOB/AGE: 02/27/1941 83 y.o.  Reason for Consult:Right open distal humerus fracture Referring Physician: Dr. Oneil Horde, MD Zelda Salmon  HPI: Krystal [name] is an 83 y.o. female who is being seen in consultation at the request of Dr. Horde for evaluation of right open distal humerus fracture.  Patient had a fall sustaining a distal humerus fracture presented to [facility] where they diagnosed her with a closed distal humerus fracture.  She was placed in a splint but unfortunately she started to bleed through.  They were concerned and brought her back to [facility].  Dr. Horde was consulted and felt that this was outside the scope of practice and that the fracture was open required admission with IV antibiotics with definitive fixation by an orthopedic traumatologist.  Patient was seen and evaluated in preoperative holding area.  She is currently comfortable but having pain.  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  It is worsened since the fracture has occurred.  She is left-hand dominant.  She lives with her son.  No was at bedside today.  Past Medical History:  Diagnosis Date   Anemia    Asthma    Brain tumor (HCC)    1986   Breast cancer (HCC)    Right breast   Cataract    Colon polyps    COPD (chronic obstructive pulmonary disease) (HCC)    Hiatal hernia    Hyperlipidemia    Macular degeneration    Osteoporosis    Pneumonia    PVD (posterior vitreous detachment)    Right knee pain    Shingles rash     Past Surgical History:  Procedure Laterality Date   CATARACT EXTRACTION, BILATERAL     COLONOSCOPY     CORONARY PRESSURE/FFR WITH 3D MAPPING N/A 03/21/2021   Procedure: Coronary Pressure Wire/FFR w/3D Mapping;  Surgeon: Mady Bruckner, MD;  Location: [facility]  INVASIVE CV LAB;  Service: Cardiovascular;  Laterality: N/A;   CRANIECTOMY / CRANIOTOMY FOR EXCISION OF BRAIN TUMOR     Left side brain   LEFT HEART CATH AND CORONARY ANGIOGRAPHY N/A 03/21/2021   Procedure: LEFT HEART CATH AND CORONARY ANGIOGRAPHY;  Surgeon: Mady Bruckner, MD;  Location: [facility] INVASIVE CV LAB;  Service: Cardiovascular;  Laterality: N/A;   MASTECTOMY Right    PARTIAL HYSTERECTOMY     POLYPECTOMY     SKIN CANCER EXCISION Left    Under left eye    Family History  Problem Relation Age of Onset   Heart disease Mother 22       Stroke    Dementia Mother    Congestive Heart Failure Mother    Osteoporosis Mother    Stroke Mother    Heart disease Sister    Hypertension Sister    Diabetes Sister    Osteoporosis Sister    Heart disease Brother    Hyperlipidemia Brother    Diabetes Brother    Heart disease Sister    Hypertension Sister    Diabetes Sister    Diabetes Sister    Hypertension Sister    Diabetes Sister    Hypertension Sister    Diabetes Brother    Hyperlipidemia Brother    Cancer Father        lung   Heart attack Father  Diabetes Brother    Heart disease Brother    Hyperlipidemia Brother    Diabetes Brother    Diabetes Son    Colon cancer Neg Hx    Rectal cancer Neg Hx    Stomach cancer Neg Hx    Esophageal cancer Neg Hx     Social History:  reports that she quit smoking about 62 years ago. Her smoking use included cigarettes. She has never used smokeless tobacco. She reports that she does not drink alcohol and does not use drugs.  Allergies:  Allergies  Allergen Reactions   Actonel [Risedronate] Nausea And Vomiting and Other (See Comments)    Throat tightness   Fosamax [Alendronate Sodium] Other (See Comments)    esophagitis   Mevacor [Lovastatin] Other (See Comments)    myalgias   Miacalcin [Calcitonin (Salmon)] Nausea And Vomiting   Mobic  [Meloxicam ] Hives and Itching    rash    Medications:  No current facility-administered  medications on file prior to encounter.   Current Outpatient Medications on File Prior to Encounter  Medication Sig Dispense Refill   Calcium  Citrate-Vitamin D  (CALCIUM  CITRATE + D) 315-250 MG-UNIT TABS Take 2 tablets by mouth daily. 120 tablet    Cholecalciferol  (VITAMIN D3) 125 MCG (5000 UT) CAPS Take 5,000 Units by mouth daily.     ELIQUIS  5 MG TABS tablet TAKE 1 TABLET BY MOUTH TWICE A DAY 180 tablet 1   esomeprazole  (NEXIUM ) 40 MG capsule Take 1 capsule (40 mg total) by mouth daily. 90 capsule 3   famotidine  (PEPCID ) 20 MG tablet TAKE ONE TABLET DAILY AFTER SUPPER 90 tablet 3   ferrous sulfate  325 (65 FE) MG tablet Take 1 tablet (325 mg total) by mouth at bedtime. 90 tablet 3   furosemide  (LASIX ) 20 MG tablet Take 0.5 tablets (10 mg total) by mouth daily. (Patient taking differently: Take 20 mg by mouth daily. Take 1/2 tablet by mouth alternating days take one tablet) 45 tablet 3   ipratropium-albuterol  (DUONEB) 0.5-2.5 (3) MG/3ML SOLN Take 3 mLs by nebulization every 6 (six) hours as needed. Dx:  Asthma 493.90 360 mL 3   linaclotide  (LINZESS ) 72 MCG capsule Take 1 capsule (72 mcg total) by mouth daily before breakfast. 30 capsule 5   metoprolol  tartrate (LOPRESSOR ) 25 MG tablet Take 1 tablet (25 mg total) by mouth 2 (two) times daily. 180 tablet 3   montelukast  (SINGULAIR ) 10 MG tablet TAKE 1 TABLET BY MOUTH EVERYDAY AT BEDTIME 90 tablet 3   Multiple Vitamins-Minerals (PRESERVISION/LUTEIN PO) Take 1 capsule by mouth at bedtime.      nitroGLYCERIN  (NITROLINGUAL ) 0.4 MG/SPRAY spray ONE SPRAY UNDER TONGUE AS NEED FOR CHESTPAIN. MAY REPEAT IN 5 MINUTES IF PAIN NOT RELIEVED CALL 911. 12 g 3   oxyCODONE -acetaminophen  (PERCOCET/ROXICET) 5-325 MG tablet Take 1 tablet by mouth every 4 (four) hours as needed for moderate pain (pain score 4-6).     potassium chloride  SA (KLOR-CON  M20) 20 MEQ tablet Take 1 tablet (20 mEq total) by mouth daily. 90 tablet 3   rosuvastatin  (CRESTOR ) 20 MG tablet TAKE 1  TABLET BY MOUTH EVERY DAY 90 tablet 3   sertraline  (ZOLOFT ) 50 MG tablet Take 1 tablet (50 mg total) by mouth daily. 90 tablet 3   diltiazem  (CARDIZEM  CD) 300 MG 24 hr capsule TAKE 1 CAPSULE BY MOUTH EVERY DAY 90 capsule 1     ROS: Constitutional: No fever or chills Vision: No changes in vision ENT: No difficulty swallowing CV: No chest pain Pulm:  No SOB or wheezing GI: No nausea or vomiting GU: No urgency or inability to hold urine Skin: No poor wound healing Neurologic: No numbness or tingling Psychiatric: No depression or anxiety Heme: No bruising Allergic: No reaction to medications or food   Exam: Blood pressure (!) 112/57, pulse 92, temperature (!) 97.4 F (36.3 C), temperature source Temporal, resp. rate 16, height 5' 3 (1.6 m), weight 73.5 kg, SpO2 96%. General: No acute distress Orientation: Awake alert and oriented x 3 Mood and Affect: Cooperative and pleasant Gait: Unable to assess due to her injury Coordination and balance: Within normal limits  Injured Extremity (CV, lymph, sensation, reflexes): Right upper extremity: Long-arm splint is in place that is clean dry and intact.  Compartments are soft compressible.  She has diminished sensation to the ulnar nerve and median nerve distribution.  She is able to wiggle her fingers.  Brisk cap refill less than 2 seconds with a warm well-perfused hand.  Left upper extremity: Skin without lesions. No tenderness to palpation. Full painless ROM, full strength in each muscle groups without evidence of instability.   Medical Decision Making: Data: Imaging: X-rays and CT scan have been reviewed which shows a right supracondylar distal humerus fracture with significant displacement and clear wound with air in the fracture site.  Labs:  Results for orders placed or performed during the hospital encounter of 08/16/23 (from the past 24 hours)  CBC with Differential     Status: Abnormal   Collection Time: 08/16/23  7:34 PM  Result  Value Ref Range   WBC 6.3 4.0 - 10.5 K/uL   RBC 3.67 (L) 3.87 - 5.11 MIL/uL   Hemoglobin 10.8 (L) 12.0 - 15.0 g/dL   HCT 66.5 (L) 63.9 - 53.9 %   MCV 91.0 80.0 - 100.0 fL   MCH 29.4 26.0 - 34.0 pg   MCHC 32.3 30.0 - 36.0 g/dL   RDW 85.3 88.4 - 84.4 %   Platelets 133 (L) 150 - 400 K/uL   nRBC 0.0 0.0 - 0.2 %   Neutrophils Relative % 63 %   Neutro Abs 4.0 1.7 - 7.7 K/uL   Lymphocytes Relative 24 %   Lymphs Abs 1.5 0.7 - 4.0 K/uL   Monocytes Relative 11 %   Monocytes Absolute 0.7 0.1 - 1.0 K/uL   Eosinophils Relative 1 %   Eosinophils Absolute 0.1 0.0 - 0.5 K/uL   Basophils Relative 0 %   Basophils Absolute 0.0 0.0 - 0.1 K/uL   Immature Granulocytes 1 %   Abs Immature Granulocytes 0.03 0.00 - 0.07 K/uL  Comprehensive metabolic panel     Status: Abnormal   Collection Time: 08/16/23  7:34 PM  Result Value Ref Range   Sodium 134 (L) 135 - 145 mmol/L   Potassium 3.5 3.5 - 5.1 mmol/L   Chloride 101 98 - 111 mmol/L   CO2 28 22 - 32 mmol/L   Glucose, Bld 126 (H) 70 - 99 mg/dL   BUN 19 8 - 23 mg/dL   Creatinine, Ser 9.31 0.44 - 1.00 mg/dL   Calcium  8.2 (L) 8.9 - 10.3 mg/dL   Total Protein 5.5 (L) 6.5 - 8.1 g/dL   Albumin 3.0 (L) 3.5 - 5.0 g/dL   AST 26 15 - 41 U/L   ALT 27 0 - 44 U/L   Alkaline Phosphatase 50 38 - 126 U/L   Total Bilirubin 0.5 0.0 - 1.2 mg/dL   GFR, Estimated >39 >39 mL/min   Anion gap 5 5 -  15  Type and screen     Status: None   Collection Time: 08/16/23  7:34 PM  Result Value Ref Range   ABO/RH(D) O POS    Antibody Screen NEG    Sample Expiration      08/19/2023,2359 Performed at [facility], [address] [place], [place] [address]   Comprehensive metabolic panel     Status: Abnormal   Collection Time: 08/17/23  5:08 AM  Result Value Ref Range   Sodium 130 (L) 135 - 145 mmol/L   Potassium 3.4 (L) 3.5 - 5.1 mmol/L   Chloride 98 98 - 111 mmol/L   CO2 24 22 - 32 mmol/L   Glucose, Bld 101 (H) 70 - 99 mg/dL   BUN 16 8 - 23 mg/dL   Creatinine, Ser 9.43  0.44 - 1.00 mg/dL   Calcium  8.2 (L) 8.9 - 10.3 mg/dL   Total Protein 5.3 (L) 6.5 - 8.1 g/dL   Albumin 2.8 (L) 3.5 - 5.0 g/dL   AST 24 15 - 41 U/L   ALT 22 0 - 44 U/L   Alkaline Phosphatase 47 38 - 126 U/L   Total Bilirubin 0.7 0.0 - 1.2 mg/dL   GFR, Estimated >39 >39 mL/min   Anion gap 8 5 - 15  CBC     Status: Abnormal   Collection Time: 08/17/23  5:08 AM  Result Value Ref Range   WBC 5.3 4.0 - 10.5 K/uL   RBC 3.46 (L) 3.87 - 5.11 MIL/uL   Hemoglobin 10.3 (L) 12.0 - 15.0 g/dL   HCT 68.4 (L) 63.9 - 53.9 %   MCV 91.0 80.0 - 100.0 fL   MCH 29.8 26.0 - 34.0 pg   MCHC 32.7 30.0 - 36.0 g/dL   RDW 85.3 88.4 - 84.4 %   Platelets 135 (L) 150 - 400 K/uL   nRBC 0.0 0.0 - 0.2 %  Magnesium      Status: None   Collection Time: 08/17/23  5:08 AM  Result Value Ref Range   Magnesium  1.9 1.7 - 2.4 mg/dL  Phosphorus     Status: None   Collection Time: 08/17/23  5:08 AM  Result Value Ref Range   Phosphorus 3.0 2.5 - 4.6 mg/dL     Imaging or Labs ordered: None  Medical history and chart was reviewed and case discussed with medical provider.  Assessment/Plan: 83 year old female with a right open distal humerus fracture.  Due to the open and unstable nature of her injury I recommend proceeding with irrigation debridement with open reduction internal fixation.  Risks and benefits were discussed with the patient.  Risks include but not limited to bleeding, infection, malunion, nonunion, hardware failure, hardware rotation, nerve and blood vessel injury, posttraumatic arthritis, elbow stiffness, DVT, even the possibility anesthetic complications.  She agreed to proceed with surgery and consent was obtained.  Franky MYRTIS Light, MD [facility] [phone] (office) [email or web address]

## 2023-08-17 NOTE — TOC CM/SW Note (Signed)
 Transition of Care [facility]) - Inpatient Brief Assessment   Patient Details  Name: Krystal [name] MRN: [id number] Date of Birth: 07-29-1941  Transition of [facility]) CM/SW Contact:    Lucie Lunger, LCSWA Phone Number: 08/17/2023, 9:24 AM   Clinical Narrative: Patient transferring to [facility] to be seen by ortho. TOC at [facility] will continue to follow and assist with discharge planning as needs arise. TOC to follow.   Transition of Care Asessment: Insurance and Status: Insurance coverage has been reviewed Patient has primary care physician: Yes Home environment has been reviewed: from home Prior level of function:: from home Prior/Current Home Services: No current home services Social Drivers of Health Review: SDOH reviewed no interventions necessary Readmission risk has been reviewed: Yes Transition of care needs: no transition of care needs at this time

## 2023-08-17 NOTE — H&P (View-Only) (Signed)
 Orthopaedic Trauma Service (OTS) Consult   Patient ID: Krystal [name] MRN: [id number] DOB/AGE: 02/27/1941 83 y.o.  Reason for Consult:Right open distal humerus fracture Referring Physician: Dr. Oneil Horde, MD Krystal [name]  HPI: Krystal [name] is an 83 y.o. female who is being seen in consultation at the request of Dr. Horde for evaluation of right open distal humerus fracture.  Patient had a fall sustaining a distal humerus fracture presented to [facility] where they diagnosed her with a closed distal humerus fracture.  She was placed in a splint but unfortunately she started to bleed through.  They were concerned and brought her back to [facility].  Dr. Horde was consulted and felt that this was outside the scope of practice and that the fracture was open required admission with IV antibiotics with definitive fixation by an orthopedic traumatologist.  Patient was seen and evaluated in preoperative holding area.  She is currently comfortable but having pain.  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  It is worsened since the fracture has occurred.  She is left-hand dominant.  She lives with her son.  No was at bedside today.  Past Medical History:  Diagnosis Date   Anemia    Asthma    Brain tumor (HCC)    1986   Breast cancer (HCC)    Right breast   Cataract    Colon polyps    COPD (chronic obstructive pulmonary disease) (HCC)    Hiatal hernia    Hyperlipidemia    Macular degeneration    Osteoporosis    Pneumonia    PVD (posterior vitreous detachment)    Right knee pain    Shingles rash     Past Surgical History:  Procedure Laterality Date   CATARACT EXTRACTION, BILATERAL     COLONOSCOPY     CORONARY PRESSURE/FFR WITH 3D MAPPING N/A 03/21/2021   Procedure: Coronary Pressure Wire/FFR w/3D Mapping;  Surgeon: Mady Bruckner, MD;  [facility]: [facility]  INVASIVE CV LAB;  Service: Cardiovascular;  Laterality: N/A;   CRANIECTOMY / CRANIOTOMY FOR EXCISION OF BRAIN TUMOR     Left side brain   LEFT HEART CATH AND CORONARY ANGIOGRAPHY N/A 03/21/2021   Procedure: LEFT HEART CATH AND CORONARY ANGIOGRAPHY;  Surgeon: Mady Bruckner, MD;  Location: [facility] INVASIVE CV LAB;  Service: Cardiovascular;  Laterality: N/A;   MASTECTOMY Right    PARTIAL HYSTERECTOMY     POLYPECTOMY     SKIN CANCER EXCISION Left    Under left eye    Family History  Problem Relation Age of Onset   Heart disease Mother 22       Stroke    Dementia Mother    Congestive Heart Failure Mother    Osteoporosis Mother    Stroke Mother    Heart disease Sister    Hypertension Sister    Diabetes Sister    Osteoporosis Sister    Heart disease Brother    Hyperlipidemia Brother    Diabetes Brother    Heart disease Sister    Hypertension Sister    Diabetes Sister    Diabetes Sister    Hypertension Sister    Diabetes Sister    Hypertension Sister    Diabetes Brother    Hyperlipidemia Brother    Cancer Father        lung   Heart attack Father  Diabetes Brother    Heart disease Brother    Hyperlipidemia Brother    Diabetes Brother    Diabetes Son    Colon cancer Neg Hx    Rectal cancer Neg Hx    Stomach cancer Neg Hx    Esophageal cancer Neg Hx     Social History:  reports that she quit smoking about 62 years ago. Her smoking use included cigarettes. She has never used smokeless tobacco. She reports that she does not drink alcohol and does not use drugs.  Allergies:  Allergies  Allergen Reactions   Actonel [Risedronate] Nausea And Vomiting and Other (See Comments)    Throat tightness   Fosamax [Alendronate Sodium] Other (See Comments)    esophagitis   Mevacor [Lovastatin] Other (See Comments)    myalgias   Miacalcin [Calcitonin ([name])] Nausea And Vomiting   Mobic  [Meloxicam ] Hives and Itching    rash    Medications:  No current facility-administered  medications on file prior to encounter.   Current Outpatient Medications on File Prior to Encounter  Medication Sig Dispense Refill   Calcium  Citrate-Vitamin D  (CALCIUM  CITRATE + D) 315-250 MG-UNIT TABS Take 2 tablets by mouth daily. 120 tablet    Cholecalciferol  (VITAMIN D3) 125 MCG (5000 UT) CAPS Take 5,000 Units by mouth daily.     ELIQUIS  5 MG TABS tablet TAKE 1 TABLET BY MOUTH TWICE A DAY 180 tablet 1   esomeprazole  (NEXIUM ) 40 MG capsule Take 1 capsule (40 mg total) by mouth daily. 90 capsule 3   famotidine  (PEPCID ) 20 MG tablet TAKE ONE TABLET DAILY AFTER SUPPER 90 tablet 3   ferrous sulfate  325 (65 FE) MG tablet Take 1 tablet (325 mg total) by mouth at bedtime. 90 tablet 3   furosemide  (LASIX ) 20 MG tablet Take 0.5 tablets (10 mg total) by mouth daily. (Patient taking differently: Take 20 mg by mouth daily. Take 1/2 tablet by mouth alternating days take one tablet) 45 tablet 3   ipratropium-albuterol  (DUONEB) 0.5-2.5 (3) MG/3ML SOLN Take 3 mLs by nebulization every 6 (six) hours as needed. Dx:  Asthma 493.90 360 mL 3   linaclotide  (LINZESS ) 72 MCG capsule Take 1 capsule (72 mcg total) by mouth daily before breakfast. 30 capsule 5   metoprolol  tartrate (LOPRESSOR ) 25 MG tablet Take 1 tablet (25 mg total) by mouth 2 (two) times daily. 180 tablet 3   montelukast  (SINGULAIR ) 10 MG tablet TAKE 1 TABLET BY MOUTH EVERYDAY AT BEDTIME 90 tablet 3   Multiple Vitamins-Minerals (PRESERVISION/LUTEIN PO) Take 1 capsule by mouth at bedtime.      nitroGLYCERIN  (NITROLINGUAL ) 0.4 MG/SPRAY spray ONE SPRAY UNDER TONGUE AS NEED FOR CHESTPAIN. MAY REPEAT IN 5 MINUTES IF PAIN NOT RELIEVED CALL 911. 12 g 3   oxyCODONE -acetaminophen  (PERCOCET/ROXICET) 5-325 MG tablet Take 1 tablet by mouth every 4 (four) hours as needed for moderate pain (pain score 4-6).     potassium chloride  SA (KLOR-CON  M20) 20 MEQ tablet Take 1 tablet (20 mEq total) by mouth daily. 90 tablet 3   rosuvastatin  (CRESTOR ) 20 MG tablet TAKE 1  TABLET BY MOUTH EVERY DAY 90 tablet 3   sertraline  (ZOLOFT ) 50 MG tablet Take 1 tablet (50 mg total) by mouth daily. 90 tablet 3   diltiazem  (CARDIZEM  CD) 300 MG 24 hr capsule TAKE 1 CAPSULE BY MOUTH EVERY DAY 90 capsule 1     ROS: Constitutional: No fever or chills Vision: No changes in vision ENT: No difficulty swallowing CV: No chest pain Pulm:  No SOB or wheezing GI: No nausea or vomiting GU: No urgency or inability to hold urine Skin: No poor wound healing Neurologic: No numbness or tingling Psychiatric: No depression or anxiety Heme: No bruising Allergic: No reaction to medications or food   Exam: Blood pressure (!) 112/57, pulse 92, temperature (!) 97.4 F (36.3 C), temperature source Temporal, resp. rate 16, height 5' 3 (1.6 m), weight 73.5 kg, SpO2 96%. General: No acute distress Orientation: Awake alert and oriented x 3 Mood and Affect: Cooperative and pleasant Gait: Unable to assess due to her injury Coordination and balance: Within normal limits  Injured Extremity (CV, lymph, sensation, reflexes): Right upper extremity: Long-arm splint is in place that is clean dry and intact.  Compartments are soft compressible.  She has diminished sensation to the ulnar nerve and median nerve distribution.  She is able to wiggle her fingers.  Brisk cap refill less than 2 seconds with a warm well-perfused hand.  Left upper extremity: Skin without lesions. No tenderness to palpation. Full painless ROM, full strength in each muscle groups without evidence of instability.   Medical Decision Making: Data: Imaging: X-rays and CT scan have been reviewed which shows a right supracondylar distal humerus fracture with significant displacement and clear wound with air in the fracture site.  Labs:  Results for orders placed or performed during the hospital encounter of 08/16/23 (from the past 24 hours)  CBC with Differential     Status: Abnormal   Collection Time: 08/16/23  7:34 PM  Result  Value Ref Range   WBC 6.3 4.0 - 10.5 K/uL   RBC 3.67 (L) 3.87 - 5.11 MIL/uL   Hemoglobin 10.8 (L) 12.0 - 15.0 g/dL   HCT 66.5 (L) 63.9 - 53.9 %   MCV 91.0 80.0 - 100.0 fL   MCH 29.4 26.0 - 34.0 pg   MCHC 32.3 30.0 - 36.0 g/dL   RDW 85.3 88.4 - 84.4 %   Platelets 133 (L) 150 - 400 K/uL   nRBC 0.0 0.0 - 0.2 %   Neutrophils Relative % 63 %   Neutro Abs 4.0 1.7 - 7.7 K/uL   Lymphocytes Relative 24 %   Lymphs Abs 1.5 0.7 - 4.0 K/uL   Monocytes Relative 11 %   Monocytes Absolute 0.7 0.1 - 1.0 K/uL   Eosinophils Relative 1 %   Eosinophils Absolute 0.1 0.0 - 0.5 K/uL   Basophils Relative 0 %   Basophils Absolute 0.0 0.0 - 0.1 K/uL   Immature Granulocytes 1 %   Abs Immature Granulocytes 0.03 0.00 - 0.07 K/uL  Comprehensive metabolic panel     Status: Abnormal   Collection Time: 08/16/23  7:34 PM  Result Value Ref Range   Sodium 134 (L) 135 - 145 mmol/L   Potassium 3.5 3.5 - 5.1 mmol/L   Chloride 101 98 - 111 mmol/L   CO2 28 22 - 32 mmol/L   Glucose, Bld 126 (H) 70 - 99 mg/dL   BUN 19 8 - 23 mg/dL   Creatinine, Ser 9.31 0.44 - 1.00 mg/dL   Calcium  8.2 (L) 8.9 - 10.3 mg/dL   Total Protein 5.5 (L) 6.5 - 8.1 g/dL   Albumin 3.0 (L) 3.5 - 5.0 g/dL   AST 26 15 - 41 U/L   ALT 27 0 - 44 U/L   Alkaline Phosphatase 50 38 - 126 U/L   Total Bilirubin 0.5 0.0 - 1.2 mg/dL   GFR, Estimated >39 >39 mL/min   Anion gap 5 5 -  15  Type and screen     Status: None   Collection Time: 08/16/23  7:34 PM  Result Value Ref Range   ABO/RH(D) O POS    Antibody Screen NEG    Sample Expiration      08/19/2023,2359 Performed at [facility], [address] [place], [place] [address]   Comprehensive metabolic panel     Status: Abnormal   Collection Time: 08/17/23  5:08 AM  Result Value Ref Range   Sodium 130 (L) 135 - 145 mmol/L   Potassium 3.4 (L) 3.5 - 5.1 mmol/L   Chloride 98 98 - 111 mmol/L   CO2 24 22 - 32 mmol/L   Glucose, Bld 101 (H) 70 - 99 mg/dL   BUN 16 8 - 23 mg/dL   Creatinine, Ser 9.43  0.44 - 1.00 mg/dL   Calcium  8.2 (L) 8.9 - 10.3 mg/dL   Total Protein 5.3 (L) 6.5 - 8.1 g/dL   Albumin 2.8 (L) 3.5 - 5.0 g/dL   AST 24 15 - 41 U/L   ALT 22 0 - 44 U/L   Alkaline Phosphatase 47 38 - 126 U/L   Total Bilirubin 0.7 0.0 - 1.2 mg/dL   GFR, Estimated >39 >39 mL/min   Anion gap 8 5 - 15  CBC     Status: Abnormal   Collection Time: 08/17/23  5:08 AM  Result Value Ref Range   WBC 5.3 4.0 - 10.5 K/uL   RBC 3.46 (L) 3.87 - 5.11 MIL/uL   Hemoglobin 10.3 (L) 12.0 - 15.0 g/dL   HCT 68.4 (L) 63.9 - 53.9 %   MCV 91.0 80.0 - 100.0 fL   MCH 29.8 26.0 - 34.0 pg   MCHC 32.7 30.0 - 36.0 g/dL   RDW 85.3 88.4 - 84.4 %   Platelets 135 (L) 150 - 400 K/uL   nRBC 0.0 0.0 - 0.2 %  Magnesium      Status: None   Collection Time: 08/17/23  5:08 AM  Result Value Ref Range   Magnesium  1.9 1.7 - 2.4 mg/dL  Phosphorus     Status: None   Collection Time: 08/17/23  5:08 AM  Result Value Ref Range   Phosphorus 3.0 2.5 - 4.6 mg/dL     Imaging or Labs ordered: None  Medical history and chart was reviewed and case discussed with medical provider.  Assessment/Plan: 83 year old female with a right open distal humerus fracture.  Due to the open and unstable nature of her injury I recommend proceeding with irrigation debridement with open reduction internal fixation.  Risks and benefits were discussed with the patient.  Risks include but not limited to bleeding, infection, malunion, nonunion, hardware failure, hardware rotation, nerve and blood vessel injury, posttraumatic arthritis, elbow stiffness, DVT, even the possibility anesthetic complications.  She agreed to proceed with surgery and consent was obtained.  Franky MYRTIS Light, MD [facility] [phone] (office) [email or web address]

## 2023-08-17 NOTE — ED Notes (Signed)
 ED TO INPATIENT HANDOFF REPORT  ED Nurse Name and Phone #: Leandrew MATSU RN [phone]  S Name/Age/Gender Krystal [name] 83 y.o. female Room/Bed: APA01/APA01  Code Status   Code Status: Full Code  Home/SNF/Other Home Patient oriented to: self, place, time, and situation Is this baseline? Yes   Triage Complete: Triage complete  Chief Complaint Open fracture of distal end of right humerus, unspecified fracture morphology, initial encounter [S42.401B]  Triage Note Pt fell off bed on Friday night. Pt was seen at [facility] and diagnosed with closed fracture of right arm and bleeding noted to elbow area. Pt is on eliquis  for a-fib.    Allergies Allergies  Allergen Reactions   Actonel [Risedronate] Nausea And Vomiting and Other (See Comments)    Throat tightness   Fosamax [Alendronate Sodium] Other (See Comments)    esophagitis   Mevacor [Lovastatin] Other (See Comments)    myalgias   Miacalcin [Calcitonin ([place])] Nausea And Vomiting   Mobic  [Meloxicam ] Hives and Itching    rash    Level of Care/Admitting Diagnosis ED Disposition     ED Disposition  Admit   Condition  --   Comment  Hospital Area: [facility] [[id number]]  Level of Care: Med-Surg [16]  May admit patient to [facility] or [facility] if equivalent level of care is available:: Yes  Covid Evaluation: Asymptomatic - no recent exposure (last 10 days) testing not required  Diagnosis: Open fracture of distal end of right humerus, unspecified fracture morphology, initial encounter [[id number]]  Admitting Physician: ADEFESO, OLADAPO [[id number]]  Attending Physician: ADEFESO, OLADAPO [[id number]]  Certification:: I certify this patient will need inpatient services for at least 2 midnights  Expected Medical Readiness: 08/19/2023          B Medical/Surgery History Past Medical History:  Diagnosis Date   Anemia    Asthma    Brain tumor (HCC)    1986   Breast cancer (HCC)    Right breast    Cataract    Colon polyps    COPD (chronic obstructive pulmonary disease) (HCC)    Hiatal hernia    Hyperlipidemia    Macular degeneration    Osteoporosis    Pneumonia    PVD (posterior vitreous detachment)    Right knee pain    Shingles rash    Past Surgical History:  Procedure Laterality Date   CATARACT EXTRACTION, BILATERAL     COLONOSCOPY     CORONARY PRESSURE/FFR WITH 3D MAPPING N/A 03/21/2021   Procedure: Coronary Pressure Wire/FFR w/3D Mapping;  Surgeon: Mady Bruckner, MD;  [facility] INVASIVE CV LAB;  Service: Cardiovascular;  Laterality: N/A;   CRANIECTOMY / CRANIOTOMY FOR EXCISION OF BRAIN TUMOR     Left side brain   LEFT HEART CATH AND CORONARY ANGIOGRAPHY N/A 03/21/2021   Procedure: LEFT HEART CATH AND CORONARY ANGIOGRAPHY;  Surgeon: Mady Bruckner, MD;  [facility]: [facility] INVASIVE CV LAB;  Service: Cardiovascular;  Laterality: N/A;   MASTECTOMY Right    PARTIAL HYSTERECTOMY     POLYPECTOMY     SKIN CANCER EXCISION Left    Under left eye     A IV Location/Drains/Wounds Patient Lines/Drains/Airways Status     Active Line/Drains/Airways     Name Placement date Placement time Site Days   Peripheral IV 08/16/23 20 G 1 Anterior;Left;Proximal Forearm 08/16/23  1912  Forearm  1            Intake/Output Last 24 hours No intake or output data in the  24 hours ending 08/17/23 0825  Labs/Imaging Results for orders placed or performed during the hospital encounter of 08/16/23 (from the past 48 hours)  CBC with Differential     Status: Abnormal   Collection Time: 08/16/23  7:34 PM  Result Value Ref Range   WBC 6.3 4.0 - 10.5 K/uL   RBC 3.67 (L) 3.87 - 5.11 MIL/uL   Hemoglobin 10.8 (L) 12.0 - 15.0 g/dL   HCT 66.5 (L) 63.9 - 53.9 %   MCV 91.0 80.0 - 100.0 fL   MCH 29.4 26.0 - 34.0 pg   MCHC 32.3 30.0 - 36.0 g/dL   RDW 85.3 88.4 - 84.4 %   Platelets 133 (L) 150 - 400 K/uL   nRBC 0.0 0.0 - 0.2 %   Neutrophils Relative % 63 %   Neutro Abs 4.0 1.7 - 7.7 K/uL    Lymphocytes Relative 24 %   Lymphs Abs 1.5 0.7 - 4.0 K/uL   Monocytes Relative 11 %   Monocytes Absolute 0.7 0.1 - 1.0 K/uL   Eosinophils Relative 1 %   Eosinophils Absolute 0.1 0.0 - 0.5 K/uL   Basophils Relative 0 %   Basophils Absolute 0.0 0.0 - 0.1 K/uL   Immature Granulocytes 1 %   Abs Immature Granulocytes 0.03 0.00 - 0.07 K/uL    Comment: Performed at [facility], [address], [place], [place] [address]  Comprehensive metabolic panel     Status: Abnormal   Collection Time: 08/16/23  7:34 PM  Result Value Ref Range   Sodium 134 (L) 135 - 145 mmol/L   Potassium 3.5 3.5 - 5.1 mmol/L   Chloride 101 98 - 111 mmol/L   CO2 28 22 - 32 mmol/L   Glucose, Bld 126 (H) 70 - 99 mg/dL    Comment: Glucose reference range applies only to samples taken after fasting for at least 8 hours.   BUN 19 8 - 23 mg/dL   Creatinine, Ser 9.31 0.44 - 1.00 mg/dL   Calcium  8.2 (L) 8.9 - 10.3 mg/dL   Total Protein 5.5 (L) 6.5 - 8.1 g/dL   Albumin 3.0 (L) 3.5 - 5.0 g/dL   AST 26 15 - 41 U/L   ALT 27 0 - 44 U/L   Alkaline Phosphatase 50 38 - 126 U/L   Total Bilirubin 0.5 0.0 - 1.2 mg/dL   GFR, Estimated >39 >39 mL/min    Comment: (NOTE) Calculated using the CKD-EPI Creatinine Equation (2021)    Anion gap 5 5 - 15    Comment: Performed at [facility], [address], [place], [place] [address]  Type and screen     Status: None   Collection Time: 08/16/23  7:34 PM  Result Value Ref Range   ABO/RH(D) O POS    Antibody Screen NEG    Sample Expiration      08/19/2023,2359 Performed at [facility], [address] [place], [place] [address]   Comprehensive metabolic panel     Status: Abnormal   Collection Time: 08/17/23  5:08 AM  Result Value Ref Range   Sodium 130 (L) 135 - 145 mmol/L   Potassium 3.4 (L) 3.5 - 5.1 mmol/L   Chloride 98 98 - 111 mmol/L   CO2 24 22 - 32 mmol/L   Glucose, Bld 101 (H) 70 - 99 mg/dL    Comment: Glucose reference range applies only to samples taken after fasting for  at least 8 hours.   BUN 16 8 - 23 mg/dL  Creatinine, Ser 0.56 0.44 - 1.00 mg/dL   Calcium  8.2 (L) 8.9 - 10.3 mg/dL   Total Protein 5.3 (L) 6.5 - 8.1 g/dL   Albumin 2.8 (L) 3.5 - 5.0 g/dL   AST 24 15 - 41 U/L   ALT 22 0 - 44 U/L   Alkaline Phosphatase 47 38 - 126 U/L   Total Bilirubin 0.7 0.0 - 1.2 mg/dL   GFR, Estimated >39 >39 mL/min    Comment: (NOTE) Calculated using the CKD-EPI Creatinine Equation (2021)    Anion gap 8 5 - 15    Comment: Performed at [facility], [address], [place], [place] [address]  CBC     Status: Abnormal   Collection Time: 08/17/23  5:08 AM  Result Value Ref Range   WBC 5.3 4.0 - 10.5 K/uL   RBC 3.46 (L) 3.87 - 5.11 MIL/uL   Hemoglobin 10.3 (L) 12.0 - 15.0 g/dL   HCT 68.4 (L) 63.9 - 53.9 %   MCV 91.0 80.0 - 100.0 fL   MCH 29.8 26.0 - 34.0 pg   MCHC 32.7 30.0 - 36.0 g/dL   RDW 85.3 88.4 - 84.4 %   Platelets 135 (L) 150 - 400 K/uL   nRBC 0.0 0.0 - 0.2 %    Comment: Performed at [facility], [address], [place], [place] [address]  Magnesium      Status: None   Collection Time: 08/17/23  5:08 AM  Result Value Ref Range   Magnesium  1.9 1.7 - 2.4 mg/dL    Comment: Performed at [facility], [address], [place], [place] [address]  Phosphorus     Status: None   Collection Time: 08/17/23  5:08 AM  Result Value Ref Range   Phosphorus 3.0 2.5 - 4.6 mg/dL    Comment: Performed at [facility], [address] [place], [place] [address]   CT Elbow Right Wo Contrast Result Date: 08/16/2023 CLINICAL DATA:  Fracture, elbow Pt fell off bed on Friday night. Pt was seen at [facility] and diagnosed with closed fracture of right arm and bleeding noted to elbow area. Pt is on eliquis  for a-fib. EXAM: CT OF THE UPPER RIGHT EXTREMITY WITHOUT CONTRAST TECHNIQUE: Multidetector CT imaging of the upper right extremity was performed according to the standard protocol. RADIATION DOSE REDUCTION: This exam was performed according to the departmental  dose-optimization program which includes automated exposure control, adjustment of the mA and/or kV according to patient size and/or use of iterative reconstruction technique. COMPARISON:  X-ray right elbow 08/16/2023 FINDINGS: Bones/Joint/Cartilage Markedly comminuted and dorsally and laterally displaced supracondylar distal humeral fracture. Likely acute displaced and comminuted avulsion fracture along the olecranon (7:60 4-66). Acute nondisplaced radial head and neck fracture not excluded. Joint effusion. Ligaments Suboptimally assessed by CT. Muscles and Tendons Heterogeneity of the surrounding elbow musculature with query underlying intramuscular hematoma. Soft tissues Subcutaneus soft tissue edema. Subcutaneus soft tissue emphysema. No retained radiopaque foreign body. IMPRESSION: 1. Likely open elbow fracture.  No dislocation. 2. Markedly comminuted and dorsally and laterally displaced supracondylar distal humeral fracture. 3. Likely acute displaced and comminuted avulsion fracture along the olecranon. 4. Acute nondisplaced radial head and neck fracture not excluded. 5. Joint effusion. 6. Heterogeneity of the surrounding elbow musculature with query underlying intramuscular hematoma. Electronically Signed   By: Morgane  Naveau M.D.   On: 08/16/2023 19:44   DG Elbow 2 Views Right Result Date: 08/16/2023 CLINICAL DATA:  Pain, fall out of bed. Elbow fracture at outside facility. EXAM: RIGHT ELBOW -  2 VIEW COMPARISON:  Elbow radiograph yesterday FINDINGS: Comminuted and highly displaced distal humerus fracture. Distal fracture fragments are displaced medially with respect to the humeral shaft. Slight decreased overriding, currently 3.1 cm, previously 4.8 cm. There is no convincing intra-articular involvement on provided views. Generalized subcutaneous edema as well as soft tissue thickening posteriorly. IMPRESSION: Comminuted, displaced and overriding distal humeral fracture. Minimally improved alignment from  yesterday's exam. Electronically Signed   By: Andrea Gasman M.D.   On: 08/16/2023 19:00    Pending Labs Unresulted Labs (From admission, onward)    None       Vitals/Pain Today's Vitals   08/17/23 0400 08/17/23 0500 08/17/23 0639 08/17/23 0700  BP: (!) 107/55 104/73  (!) 112/55  Pulse: 86 96  89  Resp: 18 18  16   Temp: 98.4 F (36.9 C)     TempSrc:      SpO2: 92% 90%  94%  Weight:      Height:      PainSc:  Asleep Asleep Asleep    Isolation Precautions No active isolations  Medications Medications  ceFAZolin  (ANCEF ) IVPB 2g/100 mL premix (0 g Intravenous Stopped 08/17/23 0300)  HYDROmorphone  (DILAUDID ) injection 0.5 mg (0.5 mg Intravenous Given 08/17/23 0229)  acetaminophen  (TYLENOL ) tablet 650 mg (has no administration in time range)    Or  acetaminophen  (TYLENOL ) suppository 650 mg (has no administration in time range)  ondansetron  (ZOFRAN ) tablet 4 mg (has no administration in time range)    Or  ondansetron  (ZOFRAN ) injection 4 mg (has no administration in time range)  feeding supplement (ENSURE ENLIVE / ENSURE PLUS) liquid 237 mL (has no administration in time range)  rosuvastatin  (CRESTOR ) tablet 20 mg (has no administration in time range)  pantoprazole  (PROTONIX ) EC tablet 80 mg (has no administration in time range)  famotidine  (PEPCID ) tablet 20 mg (has no administration in time range)  ipratropium-albuterol  (DUONEB) 0.5-2.5 (3) MG/3ML nebulizer solution 3 mL (has no administration in time range)  montelukast  (SINGULAIR ) tablet 10 mg (has no administration in time range)  0.9 % NaCl with KCl 40 mEq / L  infusion (has no administration in time range)  povidone-iodine  (BETADINE ) 10 % external solution (1 Application  Given 08/16/23 2036)    Mobility walks with device     Focused Assessments     R Recommendations: See Admitting Provider Note  Report given to:   Additional Notes: pt a/o x4. Able to urinate in fracture pan. Refuses pain medication.

## 2023-08-17 NOTE — Progress Notes (Signed)
 Transported pt to 5N room 19 with  no complications. Met in room by primary RN Cipriano. Updated report at bedside and shared visual physical assessment. RN stated she needed no further assistance from this RN. Patient awake and appropriate in no distress as I exited room.

## 2023-08-17 NOTE — Op Note (Signed)
 Orthopaedic Surgery Operative Note (CSN: [id number] ) Date of Surgery: 08/17/2023  Admit Date: 08/16/2023   Diagnoses: Pre-Op Diagnoses: Right open supracondylar distal humerus fracture  Post-Op Diagnosis: Same  Procedures: CPT 24545-Open reduction internal fixation of right supracondylar humerus fracture CPT 11012-Irrigation and debridement of right supracondylar humerus fracture  Surgeons : Primary: Krystal Franky SQUIBB, MD  Assistant: Lauraine Hudson, PA-C  Location: OR 3   Anesthesia: General with regional block   Antibiotics: Ancef  2g preop with 1 gm vancomycin  powder placed topically   Tourniquet time: None    Estimated Blood Loss: 50 mL  Complications: None  Specimens:* No specimens in log *   Implants: Implant Name Type Inv. Item Serial No. Manufacturer Lot No. LRB No. Used Action  PLATE HUM EVOS 6H R 2.7X85 - [id number] Plate PLATE HUM EVOS 6H R 2.7X85  [employer] ON TRAY Right 1 Implanted  SCREW CORT 3.5X14 ST EVOS - [id number] Screw SCREW CORT 3.5X14 ST EVOS  [employer] ON TRAY Right 1 Implanted  SCREW CORT 3.5X20 ST EVOS - [id number] Screw SCREW CORT 3.5X20 ST EVOS  [employer] ON TRAY Right 2 Implanted  SCREW CORT 2.7X17 T8 ST EVOS - [id number] Screw SCREW CORT 2.7X17 T8 ST EVOS  [employer] ON TRAY Right 1 Implanted  PLATE HUM EVOS 5H R 2.7X102 - [id number] Plate PLATE HUM EVOS 5H R 2.7X102  [employer] ON TRAY Right 1 Implanted  SCREW CORT 3.5X22 ST EVOS - [id number] Screw SCREW CORT 3.5X22 ST EVOS  [employer] ON TRAY Right 4 Implanted  SCREW CORT 2.7X20 T8 ST EVOS - [id number] Screw SCREW CORT 2.7X20 T8 ST EVOS  [employer] ON TRAY Right 1 Implanted  SCREW EVOS 2.7 X 50 LCK T8 S-T - [id number] Screw SCREW EVOS 2.7 X 50 LCK T8 S-T  [employer] ON TRAY Right 1 Implanted  SCREW LOCK EVOS 2.7X44 - [id number] Screw SCREW LOCK EVOS  2.7X44  [employer] ON TRAY Right 1 Implanted  SCREW LOCK ST EVOS 2.7X24 - [id number] Screw SCREW LOCK ST EVOS 2.7X24  [employer] ON TRAY Right 1 Implanted  SCREW LOCK ST EVOS 2.7X20 - [id number] Screw SCREW LOCK ST EVOS 2.7X20  [employer] ON TRAY Right 1 Implanted  SCREW LOCK ST EVOS 2.7X22 - [id number] Screw SCREW LOCK ST EVOS 2.7X22  [employer] ON TRAY Right 1 Implanted     Indications for Surgery: 83 year old female who sustained a fall and a right open distal humerus fracture.  Due to the unstable nature and open nature of her injury I recommend proceeding with open reduction internal fixation and irrigation debridement.  Risks and benefits were discussed with the patient.  Risks include but not limited to bleeding, infection, malunion, nonunion, hardware failure, hardware rotation, nerve and blood vessel injury, posttraumatic arthritis, elbow stiffness, even the possibility anesthetic complications.  She agreed to proceed with surgery and consent was obtained.  Operative Findings: 1.  Irrigation debridement of right type I open supracondylar distal humerus fracture 2.  Open reduction internal fixation of extra-articular supracondylar distal humerus fracture using [employer] EVOS posterior lateral and direct medial distal humeral plates.  Procedure: The patient was identified in the preoperative holding area. Consent was confirmed with the patient and their family and all questions were answered. The operative extremity was marked after confirmation with the patient. she was then  brought back to the operating room by our anesthesia colleagues.  She was placed under general anesthetic and carefully transferred over to radiolucent flattop table.  She was placed in the lateral decubitus position with the right side up.  A beanbag was deflated to position her appropriately.  An axillary roll was placed under her down extremity  to keep pressure off her neurovascular structures.  The right upper extremity was then prepped and draped in usual sterile fashion.  A timeout was performed to verify the patient, the procedure, and the extremity.  Preoperative antibiotics were dosed.  Fluoroscopic imaging showed the unstable nature of her injury.  A direct posterior approach to the distal humerus was made and carried down through skin and subcutaneous tissue.  I for started out at the lateral side and developed the intermuscular septum interval between the triceps and the brachialis.  I extended this through the anconeus to expose the distal humerus.  I delivered the humeral shaft that had punctated through the skin and debrided the ends with a ronguer and a Cobb elevator.  I then turned my attention to the medial side.  I developed the intermuscular septum and identified the ulnar nerve.  I carefully dissected this out and protected it throughout the case and visualized it throughout the entirety of the procedure.  I released it into the cubital tunnel.  Once I had adequate exposure I then worked on reduction of the fracture.  I was able to manipulate the humeral shaft back in reduction to the lateral condyle and was able to get it anatomic.  I placed a 1.6 mm K wire from the lateral condyle up across the metaphysis into the ulnar cortex of the humeral shaft.  There was a free cortical fragment that connected to the medial condyle to the humeral shaft that I was able to reduce and held provisionally with a K wire.  I confirmed alignment with fluoroscopy and then I proceeded to use a [employer] posterior lateral plate and positioned this appropriately and drilled and placed nonlocking screws into the humeral shaft and locking screws in the distal capitellum.  I then used a direct medial plate along the medial epicondyle along the medial humeral shaft.  I drilled and placed a nonlocking screw into the humeral shaft to bring it flush to bone.   I then proceeded to place nonlocking screws proximally and locking screws distally getting good purchase into the trochlea and distal articular segment.  I placed more locking screws in the lateral plate and removed all the K wires and confirmed adequate alignment and fixation with fluoroscopy.  The incision was copiously irrigated.  A gram of vancomycin  powder was placed into the incision.  A layered closure of 0 Vicryl, 2-0 Monocryl and 3-0 nylon was used to close the skin.  Sterile dressings were applied.  The patient was then awoken from anesthesia and taken to the PACU in stable condition.   Debridement type: Excisional Debridement  Side: right  Body Location: Elbow  Tools used for debridement: scalpel, scissors, and rongeur  Pre-debridement Wound size (cm):   Length: 1        Width: 0.5     Depth: 0.5   Post-debridement Wound size (cm):   N/A-closed  Debridement depth beyond dead/damaged tissue down to healthy viable tissue: yes  Tissue layer involved: skin, subcutaneous tissue, muscle / fascia  Nature of tissue removed: Devitalized Tissue  Irrigation volume: 1L     Irrigation fluid type: Normal  Saline   Post Op Plan/Instructions: The patient will be nonweightbearing to the right upper extremity.  She will receive postoperative Ancef .  She will receive her Eliquis  postoperative day 1.  Will start gentle range of motion to the elbow.  She will mobilize with physical and Occupational Therapy.  I was present and performed the entire surgery.  Lauraine Hudson, PA-C did assist me throughout the case. An assistant was necessary given the difficulty in approach, maintenance of reduction and ability to instrument the fracture.   Franky Light, MD [facility]

## 2023-08-17 NOTE — Interval H&P Note (Signed)
 History and Physical Interval Note:  08/17/2023 3:05 PM  Krystal [name]  has presented today for surgery, with the diagnosis of Right distal humerus fracture.  The various methods of treatment have been discussed with the patient and family. After consideration of risks, benefits and other options for treatment, the patient has consented to  Procedure(s): OPEN REDUCTION INTERNAL FIXATION (ORIF) DISTAL HUMERUS FRACTURE (Right) as a surgical intervention.  The patient's history has been reviewed, patient examined, no change in status, stable for surgery.  I have reviewed the patient's chart and labs.  Questions were answered to the patient's satisfaction.     Lindsy Cerullo P [name]

## 2023-08-18 DIAGNOSIS — S42401B Unspecified fracture of lower end of right humerus, initial encounter for open fracture: Secondary | ICD-10-CM | POA: Diagnosis not present

## 2023-08-18 LAB — BASIC METABOLIC PANEL
Anion gap: 13 (ref 5–15)
BUN: 18 mg/dL (ref 8–23)
CO2: 21 mmol/L — ABNORMAL LOW (ref 22–32)
Calcium: 8.4 mg/dL — ABNORMAL LOW (ref 8.9–10.3)
Chloride: 104 mmol/L (ref 98–111)
Creatinine, Ser: 0.85 mg/dL (ref 0.44–1.00)
GFR, Estimated: >60 mL/min (ref >60)
Glucose, Bld: 158 mg/dL — ABNORMAL HIGH (ref 70–99)
Potassium: 4.2 mmol/L (ref 3.5–5.1)
Sodium: 138 mmol/L (ref 135–145)

## 2023-08-18 LAB — CBC
HCT: 31.4 % — ABNORMAL LOW (ref 36.0–46.0)
Hemoglobin: 10.1 g/dL — ABNORMAL LOW (ref 12.0–15.0)
MCH: 29.6 pg (ref 26.0–34.0)
MCHC: 32.2 g/dL (ref 30.0–36.0)
MCV: 92.1 fL (ref 80.0–100.0)
Platelets: 149 10*3/uL — ABNORMAL LOW (ref 150–400)
RBC: 3.41 MIL/uL — ABNORMAL LOW (ref 3.87–5.11)
RDW: 14.1 % (ref 11.5–15.5)
WBC: 5.6 10*3/uL (ref 4.0–10.5)
nRBC: 0 % (ref 0.0–0.2)

## 2023-08-18 LAB — MAGNESIUM: Magnesium: 1.8 mg/dL (ref 1.7–2.4)

## 2023-08-18 MED ORDER — ACETAMINOPHEN 500 MG PO TABS
1000.0000 mg | ORAL_TABLET | Freq: Three times a day (TID) | ORAL | Status: DC
  Administered 2023-08-18 – 2023-08-21 (×10): 1000 mg via ORAL
  Filled 2023-08-18 (×10): qty 2

## 2023-08-18 MED ORDER — APIXABAN 5 MG PO TABS
5.0000 mg | ORAL_TABLET | Freq: Two times a day (BID) | ORAL | Status: DC
  Administered 2023-08-18 – 2023-08-21 (×7): 5 mg via ORAL
  Filled 2023-08-18 (×7): qty 1

## 2023-08-18 NOTE — NC FL2 (Signed)
 [place]  MEDICAID FL2 LEVEL OF CARE FORM     IDENTIFICATION  Patient Name: Krystal [name] Birthdate: [birth year] Sex: female Admission Date (Current Location): 08/16/2023  [place] and [place] Number:  [place] [id number] Facility and Address:  [facility], [address], [place], [place] [address]      Provider Number: [id number]  Attending Physician Name and Address:  Arlice Reichert, MD  Relative Name and Phone Number:  Francine, Hannan [phone]    Current Level of Care: Hospital Recommended Level of Care: Skilled Nursing Facility Prior Approval Number:    Date Approved/Denied:   PASRR Number: [id number]  Discharge Plan: SNF    Current Diagnoses: Patient Active Problem List   Diagnosis Date Noted   Open fracture of distal end of humerus 08/17/2023   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   Hypoalbuminemia due to protein-calorie malnutrition (HCC) 08/17/2023   Atrial fibrillation, chronic (HCC) 08/17/2023   Open fracture of distal end of right humerus, unspecified fracture morphology, initial encounter 08/16/2023   Chronic combined systolic and diastolic heart failure (HCC) 03/16/2023   Nonrheumatic aortic valve insufficiency 05/13/2022   COPD (chronic obstructive pulmonary disease) (HCC) 04/23/2021   Gait instability 03/28/2021   Chronic right SI joint pain 03/28/2021   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   PAF (paroxysmal atrial fibrillation) (HCC) 03/28/2021   Unstable angina (HCC)    CAD (coronary artery disease) 03/20/2021   Osteoporosis 03/24/2019   Abdominal aortic atherosclerosis (HCC) 04/10/2015   PSVT (paroxysmal supraventricular tachycardia) (HCC) 05/10/2014   Macular degeneration, age related, nonexudative 08/24/2013   History of right mastectomy 07/13/2013   Anemia, iron deficiency 06/21/2013   Allergic rhinitis 06/21/2013   Chronic low back pain 06/21/2013   Malignant neoplasm of female breast (HCC) 11/22/2007   Mixed hyperlipidemia  11/22/2007   Anxiety state 11/22/2007   Depression, recurrent (HCC) 11/22/2007   Asthma 11/22/2007   GERD 11/22/2007   Diaphragmatic hernia 08/14/2007   Hiatal hernia 08/14/2007   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    Orientation RESPIRATION BLADDER Height & Weight     Self, Time, Situation, Place  O2 Continent Weight: 162 lb 0.6 oz (73.5 kg) Height:  5' 3 (160 cm)  BEHAVIORAL SYMPTOMS/MOOD NEUROLOGICAL BOWEL NUTRITION STATUS      Continent Diet (see discharge summary)  AMBULATORY STATUS COMMUNICATION OF NEEDS Skin   Limited Assist Verbally Surgical wounds                       Personal Care Assistance Level of Assistance  Bathing, Feeding, Dressing Bathing Assistance: Maximum assistance Feeding assistance: Limited assistance Dressing Assistance: Maximum assistance     Functional Limitations Info  Sight, Hearing, Speech Sight Info: Adequate Hearing Info: Impaired Speech Info: Adequate    SPECIAL CARE FACTORS FREQUENCY  PT (By licensed PT), OT (By licensed OT)     PT Frequency: 5x week OT Frequency: 5x week            Contractures Contractures Info: Not present    Additional Factors Info  Code Status, Allergies Code Status Info: full Allergies Info: Actonel (Risedronate), Fosamax (Alendronate Sodium), Mevacor (Lovastatin), Miacalcin (Calcitonin ([place])), Mobic  (Meloxicam )           Current Medications (08/18/2023):  This is the current hospital active medication list Current Facility-Administered Medications  Medication Dose Route Frequency Provider Last Rate Last Admin   acetaminophen  (TYLENOL ) tablet 1,000 mg  1,000 mg Oral TID Dahal, Binaya, MD   1,000 mg at 08/18/23 1531  apixaban  (ELIQUIS ) tablet 5 mg  5 mg Oral BID Paytes, Austin A, RPH   5 mg at 08/18/23 0931   diltiazem  (CARDIZEM  CD) 24 hr capsule 300 mg  300 mg Oral Daily Danton Lauraine LABOR, PA-C   300 mg at 08/18/23 0931   docusate sodium  (COLACE) capsule 100 mg  100 mg Oral BID Danton Lauraine LABOR, PA-C   100 mg at 08/18/23 9068   famotidine  (PEPCID ) tablet 20 mg  20 mg Oral QHS Danton Lauraine LABOR, PA-C   20 mg at 08/17/23 2140   feeding supplement (ENSURE ENLIVE / ENSURE PLUS) liquid 237 mL  237 mL Oral BID BM Danton Lauraine LABOR, PA-C   237 mL at 08/18/23 0932   HYDROmorphone  (DILAUDID ) injection 0.5 mg  0.5 mg Intravenous Q4H PRN Danton Lauraine LABOR, PA-C       ipratropium-albuterol  (DUONEB) 0.5-2.5 (3) MG/3ML nebulizer solution 3 mL  3 mL Nebulization Q6H PRN Danton, Sarah A, PA-C       methocarbamol  (ROBAXIN ) tablet 500 mg  500 mg Oral Q6H PRN Danton, Sarah A, PA-C       Or   methocarbamol  (ROBAXIN ) injection 500 mg  500 mg Intravenous Q6H PRN Danton Lauraine LABOR, PA-C       metoCLOPramide  (REGLAN ) tablet 5-10 mg  5-10 mg Oral Q8H PRN Danton, Sarah A, PA-C       Or   metoCLOPramide  (REGLAN ) injection 5-10 mg  5-10 mg Intravenous Q8H PRN Danton Lauraine LABOR, PA-C       montelukast  (SINGULAIR ) tablet 10 mg  10 mg Oral QHS Danton Lauraine LABOR, PA-C   10 mg at 08/17/23 2139   ondansetron  (ZOFRAN ) tablet 4 mg  4 mg Oral Q6H PRN Danton Lauraine LABOR, PA-C       Or   ondansetron  (ZOFRAN ) injection 4 mg  4 mg Intravenous Q6H PRN Danton Lauraine LABOR, PA-C       oxyCODONE  (Oxy IR/ROXICODONE ) immediate release tablet 5 mg  5 mg Oral Q4H PRN Danton Lauraine LABOR, PA-C       pantoprazole  (PROTONIX ) EC tablet 80 mg  80 mg Oral Q1200 Danton Lauraine LABOR, PA-C   80 mg at 08/18/23 1110   polyethylene glycol (MIRALAX  / GLYCOLAX ) packet 17 g  17 g Oral Daily PRN Danton Lauraine LABOR, PA-C       rosuvastatin  (CRESTOR ) tablet 20 mg  20 mg Oral Daily Danton Lauraine LABOR, PA-C   20 mg at 08/18/23 9068     Discharge Medications: Please see discharge summary for a list of discharge medications.  Relevant Imaging Results:  Relevant Lab Results:   Additional Information SSN: [id number]  Bridget Cordella Simmonds, LCSW

## 2023-08-18 NOTE — Plan of Care (Signed)
  Problem: Education: Goal: Knowledge of General Education information will improve Description: Including pain rating scale, medication(s)/side effects and non-pharmacologic comfort measures Outcome: Progressing   Problem: Health Behavior/Discharge Planning: Goal: Ability to manage health-related needs will improve Outcome: Progressing   Problem: Clinical Measurements: Goal: Ability to maintain clinical measurements within normal limits will improve Outcome: Progressing   Problem: Clinical Measurements: Goal: Diagnostic test results will improve Outcome: Progressing   Problem: Clinical Measurements: Goal: Respiratory complications will improve Outcome: Progressing   Problem: Clinical Measurements: Goal: Cardiovascular complication will be avoided Outcome: Progressing   Problem: Pain Management: Goal: General experience of comfort will improve Outcome: Progressing   Problem: Safety: Goal: Ability to remain free from injury will improve Outcome: Progressing

## 2023-08-18 NOTE — TOC CAGE-AID Note (Signed)
 Transition of Care [facility]) - CAGE-AID Screening   Patient Details  Name: Krystal [name] MRN: [id number] Date of Birth: January 31, 1941  Transition of [facility]) CM/SW Contact:    LEBRON ROCKIE ORN, RN Phone Number: [phone] 08/18/2023, 10:21 AM   Clinical Narrative: Pt in hospital after sustaining a right humeral open fracture due to falling out of bed a few days ago.  Pt is on eliquis  and came back to the ED due to increased difficulty to the right arm.  Pt was unable to grip objects and had some numbness.  Arm also bleeding around the initial wound.  Pt denies alcohol or drug use.  Pt reports she quit smoking a long time ago.  No resources needed. Screening complete.   CAGE-AID Screening:    Have You Ever Felt You Ought to Cut Down on Your Drinking or Drug Use?: No Have People Annoyed You By Critizing Your Drinking Or Drug Use?: No Have You Felt Bad Or Guilty About Your Drinking Or Drug Use?: No Have You Ever Had a Drink or Used Drugs First Thing In The Morning to Steady Your Nerves or to Get Rid of a Hangover?: No CAGE-AID Score: 0  Substance Abuse Education Offered: No

## 2023-08-18 NOTE — Progress Notes (Signed)
 Pt arrived to the unit alert and oriented to 4. Pt denies pain in the RUE. Neurovascular checks Q 2 hours for sensation and movement. Pt unable to wiggle fingers. Pt has very light sensation. Cap refill less than 3 sec. Fingers are warm to touch. PT have voided 3 times incon't of urine  since arrival on the unit. Son is at bedside.

## 2023-08-18 NOTE — Evaluation (Signed)
 Physical Therapy Evaluation  Patient Details Name: Krystal [name] MRN: [id number] DOB: 1941/06/02 Today's Date: 08/18/2023  History of Present Illness  Pt is an 83 y/o female who presents from [facility] with open R supracondylar humerus fracture. She underwent I&D and ORIF on 08/17/2023. PMH significant for Asthma, brain tumor, breast CA, COPD, macular degeneration, osteoporosis, PNA, shingles, crani for excision of brain tumor, mastectomy.   Clinical Impression  Pt admitted with above diagnosis. At the time of PT eval pt was able to perform transfers and ambulation with up to +2 min assist and HHA for support. Pt is currently at a high risk for falls and recommend post-acute rehab <3 hours/day to maximize functional recovery, improve balance, and facilitate safe return home with family support. Pt currently with functional limitations due to the deficits listed below (see PT Problem List). Pt will benefit from acute skilled PT to increase their independence and safety with mobility to allow discharge.           If plan is discharge home, recommend the following: A lot of help with walking and/or transfers;A lot of help with bathing/dressing/bathroom;Assistance with cooking/housework;Assist for transportation;Help with stairs or ramp for entrance;Supervision due to cognitive status   Can travel by private vehicle   Yes    Equipment Recommendations None recommended by PT  Recommendations for Other Services       Functional Status Assessment Patient has had a recent decline in their functional status and demonstrates the ability to make significant improvements in function in a reasonable and predictable amount of time.     Precautions / Restrictions Precautions Precautions: Fall Splint/Cast - Date Prophylactic Dressing Applied (if applicable): 08/17/23 Restrictions Weight Bearing Restrictions Per Provider Order: No      Mobility  Bed Mobility               General bed mobility  comments: Pt was received sitting up in the recliner.    Transfers Overall transfer level: Needs assistance Equipment used: 2 person hand held assist Transfers: Sit to/from Stand Sit to Stand: Min assist, +2 physical assistance, +2 safety/equipment           General transfer comment: VC's for hand placement on seated surface for safety. Pt required increased time to gain/maintain standing balance once standing.    Ambulation/Gait Ambulation/Gait assistance: Min assist, +2 physical assistance, +2 safety/equipment Gait Distance (Feet): 25 Feet Assistive device: 2 person hand held assist Gait Pattern/deviations: Step-through pattern, Decreased stride length, Trunk flexed Gait velocity: Decreased Gait velocity interpretation: <1.31 ft/sec, indicative of household ambulator   General Gait Details: Poor balance requiring heavy HHA on the L and +2 at gait belt for safety and balance support.  Stairs            Wheelchair Mobility     Tilt Bed    Modified Rankin (Stroke Patients Only)       Balance Overall balance assessment: Needs assistance Sitting-balance support: Feet supported, No upper extremity supported Sitting balance-Leahy Scale: Fair     Standing balance support: No upper extremity supported, During functional activity Standing balance-Leahy Scale: Poor                               Pertinent Vitals/Pain Pain Assessment Pain Assessment: Faces Faces Pain Scale: Hurts a little bit Pain Location: RUE Pain Descriptors / Indicators: Operative site guarding Pain Intervention(s): Limited activity within patient's tolerance, Monitored during session, Repositioned  Home Living Family/patient expects to be discharged to:: Private residence Living Arrangements: Children Available Help at Discharge: Family;Available 24 hours/day Type of Home: Mobile home Home Access: Ramped entrance       Home Layout: One level Home Equipment: Advice Worker (2 wheels);Rollator (4 wheels);Hand held shower head;BSC/3in1 (3 wheeled walker)      Prior Function Prior Level of Function : Independent/Modified Independent             Mobility Comments: Using a walker for all mobility ADLs Comments: Ind with bathing, dressing, laundry     Extremity/Trunk Assessment   Upper Extremity Assessment Upper Extremity Assessment: RUE deficits/detail;Defer to OT evaluation    Lower Extremity Assessment Lower Extremity Assessment: Generalized weakness    Cervical / Trunk Assessment Cervical / Trunk Assessment: Normal  Communication   Communication Communication: Hearing impairment Cueing Techniques: Verbal cues;Gestural cues;Tactile cues  Cognition Arousal: Alert Behavior During Therapy: WFL for tasks assessed/performed Overall Cognitive Status: Difficult to assess                                          General Comments      Exercises     Assessment/Plan    PT Assessment Patient needs continued PT services  PT Problem List Decreased strength;Decreased range of motion;Decreased activity tolerance;Decreased balance;Decreased mobility;Decreased knowledge of use of DME;Decreased safety awareness;Decreased knowledge of precautions;Pain       PT Treatment Interventions DME instruction;Gait training;Functional mobility training;Therapeutic activities;Therapeutic exercise;Balance training;Patient/family education    PT Goals (Current goals can be found in the Care Plan section)  Acute Rehab PT Goals Patient Stated Goal: Be able to return home at d/c PT Goal Formulation: With patient Time For Goal Achievement: 09/01/23 Potential to Achieve Goals: Good    Frequency Min 1X/week     Co-evaluation PT/OT/SLP Co-Evaluation/Treatment: Yes Reason for Co-Treatment: For patient/therapist safety;To address functional/ADL transfers PT goals addressed during session: Mobility/safety with  mobility;Balance;Strengthening/ROM         AM-PAC PT 6 Clicks Mobility  Outcome Measure Help needed turning from your back to your side while in a flat bed without using bedrails?: A Lot Help needed moving from lying on your back to sitting on the side of a flat bed without using bedrails?: A Lot Help needed moving to and from a bed to a chair (including a wheelchair)?: A Lot Help needed standing up from a chair using your arms (e.g., wheelchair or bedside chair)?: A Lot Help needed to walk in hospital room?: A Lot Help needed climbing 3-5 steps with a railing? : Total 6 Click Score: 11    End of Session Equipment Utilized During Treatment: Gait belt;Other (comment) (sling) Activity Tolerance: Patient tolerated treatment well Patient left: in chair;with call bell/phone within reach Nurse Communication: Mobility status PT Visit Diagnosis: Unsteadiness on feet (R26.81);Pain Pain - Right/Left: Right Pain - part of body: Arm    Time: 8846-8777 PT Time Calculation (min) (ACUTE ONLY): 29 min   Charges:   PT Evaluation $PT Eval Moderate Complexity: 1 Mod   PT General Charges $$ ACUTE PT VISIT: 1 Visit         Leita Sable, PT, DPT Acute Rehabilitation Services Secure Chat Preferred Office: [phone]   Leita JONETTA Sable 08/18/2023, 1:45 PM

## 2023-08-18 NOTE — TOC Initial Note (Signed)
 Transition of Care [facility]) - Initial/Assessment Note    Patient Details  Name: Krystal [name] MRN: [id number] Date of Birth: [birth year]  Transition of Care [facility]) CM/SW Contact:    Bridget Cordella Simmonds, LCSW Phone Number: 08/18/2023, 4:12 PM  Clinical Narrative: CSW met with pt regarding PT recommendation for SNF.  Pt states she is agreeable to this but only willing to go to [facility].  Pt lives with son Garen, daughter in law [place], permission given to speak with them.  No current services.  Referral sent to [facility].                   Expected Discharge Plan: Skilled Nursing Facility Barriers to Discharge: Continued Medical Work up, SNF Pending bed offer   Patient Goals and CMS Choice     Choice offered to / list presented to : Patient (requesting [facility])      Expected Discharge Plan and Services In-house Referral: Clinical Social Work   Post Acute Care Choice: Skilled Nursing Facility Living arrangements for the past 2 months: Single Family Home                                      Prior Living Arrangements/Services Living arrangements for the past 2 months: Single Family Home Lives with:: Adult Children ([place]aren, [place]) Patient language and need for interpreter reviewed:: Yes Do you feel safe going back to the place where you live?: Yes      Need for Family Participation in Patient Care: Yes (Comment) Care giver support system in place?: Yes (comment) Current home services: Other (comment) (none) Criminal Activity/Legal Involvement Pertinent to Current Situation/Hospitalization: No - Comment as needed  Activities of Daily Living   ADL Screening (condition at time of admission) Independently performs ADLs?: Yes (appropriate for developmental age) Is the patient deaf or have difficulty hearing?: No Does the patient have difficulty seeing, even when wearing glasses/contacts?: No Does the patient have difficulty concentrating, remembering,  or making decisions?: No  Permission Sought/Granted Permission sought to share information with : Family Supports Permission granted to share information with : Yes, Verbal Permission Granted  Share Information with NAME: son Garen, DIL B[place]  Permission granted to share info w AGENCY: SNF        Emotional Assessment Appearance:: Appears stated age Attitude/Demeanor/Rapport: Engaged Affect (typically observed): Appropriate, Pleasant Orientation: : Oriented to Self, Oriented to Place, Oriented to  Time, Oriented to Situation      Admission diagnosis:  Open fracture of distal end of right humerus, unspecified fracture morphology, initial encounter [S42.401B] Patient Active Problem List   Diagnosis Date Noted   Open fracture of distal end of humerus 08/17/2023   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   Hypoalbuminemia due to protein-calorie malnutrition (HCC) 08/17/2023   Atrial fibrillation, chronic (HCC) 08/17/2023   Open fracture of distal end of right humerus, unspecified fracture morphology, initial encounter 08/16/2023   Chronic combined systolic and diastolic heart failure (HCC) 03/16/2023   Nonrheumatic aortic valve insufficiency 05/13/2022   COPD (chronic obstructive pulmonary disease) (HCC) 04/23/2021   Gait instability 03/28/2021   Chronic right SI joint pain 03/28/2021   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   PAF (paroxysmal atrial fibrillation) (HCC) 03/28/2021   Unstable angina (HCC)    CAD (coronary artery disease) 03/20/2021   Osteoporosis 03/24/2019   Abdominal aortic atherosclerosis (HCC) 04/10/2015   PSVT (paroxysmal supraventricular tachycardia) (HCC) 05/10/2014  Macular degeneration, age related, nonexudative 08/24/2013   History of right mastectomy 07/13/2013   Anemia, iron deficiency 06/21/2013   Allergic rhinitis 06/21/2013   Chronic low back pain 06/21/2013   Malignant neoplasm of female breast (HCC) 11/22/2007   Mixed hyperlipidemia 11/22/2007   Anxiety state  11/22/2007   Depression, recurrent (HCC) 11/22/2007   Asthma 11/22/2007   GERD 11/22/2007   Diaphragmatic hernia 08/14/2007   Hiatal hernia 08/14/2007   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   PCP:  Dettinger, Fonda LABOR, MD Pharmacy:   [phone] [place] [place] - [address] [place] [address] Phone: [phone] Fax: [phone]  [facility] [place], [place] - [address] [facility] AT Portal to Registered [facility] Sites [address] [place] [address] Phone: [phone] Fax: [phone]     Social Drivers of Health (SDOH) Social History: SDOH Screenings   Food Insecurity: No Food Insecurity (08/17/2023)  Housing: Low Risk  (08/17/2023)  Transportation Needs: No Transportation Needs (08/17/2023)  Utilities: Not At Risk (08/17/2023)  Alcohol Screen: Low Risk  (08/13/2022)  Depression (PHQ2-9): Low Risk  (03/16/2023)  Financial Resource Strain: Low Risk  (08/13/2022)  Physical Activity: Sufficiently Active (08/13/2022)  Social Connections: Socially Isolated (08/17/2023)  Stress: No Stress Concern Present (08/13/2022)  Tobacco Use: Medium Risk (08/17/2023)   SDOH Interventions:     Readmission Risk Interventions    08/17/2023    9:24 AM 03/21/2021   12:23 PM  Readmission Risk Prevention Plan  Transportation Screening Complete Complete  Home Care Screening Complete Complete  Medication Review (RN CM) Complete Complete

## 2023-08-18 NOTE — Progress Notes (Addendum)
 Orthopaedic Trauma Progress Note  SUBJECTIVE: Doing okay this morning, notes mild to moderate pain to the right elbow.  Nerve block starting to wear off.  Regained some some motion in the fingers.  Sensation has not fully returned to the hand.  No chest pain. No SOB. No nausea/vomiting. No other complaints.  Son at bedside, ordering patient breakfast now.  Patient tolerating fluids.  OBJECTIVE:  Vitals:   08/18/23 0130 08/18/23 0427  BP: 109/60 (!) 105/59  Pulse: 72 84  Resp: 16 17  Temp: 97.8 F (36.6 C) 97.9 F (36.6 C)  SpO2: 96% 96%    General: Sitting comfortably in bed, no acute distress Cardiac: RRR Respiratory: No increased work of breathing. CTA  Abdomen: normoactive BS Right upper extremity: Sling in place.  Dressing clean, dry, intact.  Tenderness about the elbow as expected.  No significant tenderness throughout the forearm.  Tolerates gentle passive wrist range of motion.  Actively unable to fully extend the wrist.  Able to wiggle the fingers.  Does have some extension of the thumb.  Endorses sensation to light touch over all fingers.  Endorses sensation through the axillary nerve distribution.  Tolerates gentle passive elbow range of motion. +Radial pulse  IMAGING: Stable post op imaging.   LABS:  Results for orders placed or performed during the hospital encounter of 08/16/23 (from the past 24 hours)  Surgical pcr screen     Status: None   Collection Time: 08/17/23  2:39 PM   Specimen: Nasal Mucosa; Nasal Swab  Result Value Ref Range   MRSA, PCR NEGATIVE NEGATIVE   Staphylococcus aureus NEGATIVE NEGATIVE  CBC     Status: Abnormal   Collection Time: 08/18/23  5:22 AM  Result Value Ref Range   WBC 5.6 4.0 - 10.5 K/uL   RBC 3.41 (L) 3.87 - 5.11 MIL/uL   Hemoglobin 10.1 (L) 12.0 - 15.0 g/dL   HCT 68.5 (L) 63.9 - 53.9 %   MCV 92.1 80.0 - 100.0 fL   MCH 29.6 26.0 - 34.0 pg   MCHC 32.2 30.0 - 36.0 g/dL   RDW 85.8 88.4 - 84.4 %   Platelets 149 (L) 150 - 400 K/uL    nRBC 0.0 0.0 - 0.2 %  Basic metabolic panel     Status: Abnormal   Collection Time: 08/18/23  5:22 AM  Result Value Ref Range   Sodium 138 135 - 145 mmol/L   Potassium 4.2 3.5 - 5.1 mmol/L   Chloride 104 98 - 111 mmol/L   CO2 21 (L) 22 - 32 mmol/L   Glucose, Bld 158 (H) 70 - 99 mg/dL   BUN 18 8 - 23 mg/dL   Creatinine, Ser 9.14 0.44 - 1.00 mg/dL   Calcium  8.4 (L) 8.9 - 10.3 mg/dL   GFR, Estimated >39 >39 mL/min   Anion gap 13 5 - 15  Magnesium      Status: None   Collection Time: 08/18/23  5:22 AM  Result Value Ref Range   Magnesium  1.8 1.7 - 2.4 mg/dL    ASSESSMENT: Krystal [name] is a 83 y.o. female, 1 Day Post-Op s/p OPEN REDUCTION INTERNAL FIXATION RIGHT DISTAL HUMERUS FRACTURE  CV/Blood loss: Acute blood loss anemia, Hgb 10.1 this morning, relatively stable from preop. Hemodynamically stable  PLAN: Weightbearing: NWB RUE ROM: Okay for gentle elbow motion.  Unrestricted shoulder motion Incisional and dressing care: Reinforce dressings as needed  Showering: Hold off on showering for now.  Okay for bed bath.  Orthopedic device(s):  Sling as needed for comfort Pain management:  1. Tylenol  1000 mg 3 times daily  2. Robaxin  500 mg q 6 hours PRN 3. Oxycodone  5 mg q 4 hours PRN 4. Dilaudid  0.5 mg q 4 hours PRN VTE prophylaxis:  Restart home dose Eliquis  today , SCDs ID:  Ancef  2gm post op per open fracture protocol Foley/Lines:  No foley, KVO IVFs Impediments to Fracture Healing: Vitamin D  48.1 when last checked February 2023.  Is on home dose vitamin D3 5000 units daily at baseline Dispo: PT/OT evaluation today.  Dispo pending but would anticipate patient being able to discharge home with son/daughter-in-law in next 24 to 48 hours   D/C recommendations: -Oxycodone  and Tylenol  for pain control -Home dose Eliquis  for DVT prophylaxis -Continue home dose Vit D supplementation  Follow - up plan: 2 weeks after discharge for wound check and repeat x-rays   Contact  information:  Franky Light MD, Lauraine Moores PA-C. After hours and holidays please check [email or web address] for group call information for [facility]   Lauraine PATRIC Moores, PA-C [phone] (office) [email or web address]

## 2023-08-18 NOTE — Progress Notes (Signed)
 PROGRESS NOTE  SHERRONDA SWEIGERT  DOB: 1940/12/02  PCP: Dettinger, Fonda LABOR, MD FMW:[id number]  DOA: 08/16/2023  LOS: 2 days  Hospital Day: 3  Brief narrative: Krystal [name] is a 83 y.o. female with PMH significant for HLD, A-fib on Eliquis , COPD, GERD, osteoporosis 1/10, patient fell off her bed and sustained right arm pain. Seen at [facility] ED, diagnosed with fracture of her right arm, placed a posterior slab and discharged home.   At home, family noted significant bleeding around the wound and difficulty using right hand 1/12, patient presented to ED at [facility]   In the ED, hemodynamically stable CT of right upper extremity showed  -an open elbow fracture without dislocation.   -Markedly comminuted and dorsally and laterally displaced supracondylar distal humeral fracture.  -Likely acute displaced and comminuted avulsion fracture along the olecranon.  -Acute nondisplaced radial head and neck fracture not excluded.  -Joint effusion.  Patient was also noted to have residual numbness of the right hand probably secondary to radial nerve injury. In the ED the wound was irrigated and skin was cleaned with Betadine .   Patient was started on 2 g cefazolin . Orthopedic surgery (Dr. Onesimo) was consulted and he recommended transfer to [facility] with orthopedic trauma surgeon (Dr. Kendal)  Patient was transferred to [facility] under TRH 1/13, underwent IND and ORIF of right supracondylar humerus fracture  Subjective: Patient was seen and examined this morning.  Elderly Caucasian female.  Propped up in bed.  Not in distress.  On low-flow oxygen.  Son and daughter-in-law at bedside. Chart reviewed. Overnight, heart rate 80s, blood pressure in low 100s. Labs from this morning with hemoglobin stable at 10.1.  Assessment and plan: Open right supracondylar humerus fracture S/p I&D and ORIF -1/13 Dr. Kendal Secondary to a fall off her bed. Imaging and procedure as above Pain regimen:  Scheduled Tylenol , as needed oxycodone  and as needed Dilaudid , as needed Robaxin  Bowel regimen:Scheduled docusate, as needed MiraLAX  DVT prophylaxis: Previously on Eliquis  which has been resumed Fall precautions PT/OT eval pending.  Per orthopedics, okay for gentle elbow motion, unrestricted shoulder motion.   Radial nerve palsy Per orthopedics note, some residual numbness to the right hand secondary to previous nerve injury to the right hand and she has had previous surgeries there. It is worsened since the fracture has occurred.   Mild acute blood loss anemia Hemoglobin lower than baseline but still above 10. Continue monitor Recent Labs    03/16/23 1625 05/22/23 1438 08/16/23 1934 08/17/23 0508 08/18/23 0522  HGB 11.9 12.3 10.8* 10.3* 10.1*  MCV 91 92 91.0 91.0 92.1   Mild hypokalemia Improved with replacement. Recent Labs  Lab 08/16/23 1934 08/17/23 0508 08/18/23 0522  K 3.5 3.4* 4.2  MG  --  1.9 1.8  PHOS  --  3.0  --    Hyponatremia Improved Recent Labs  Lab 08/16/23 1934 08/17/23 0508 08/18/23 0522  NA 134* 130* 138   Mild thrombocytopenia  Improving Recent Labs  Lab 08/16/23 1934 08/17/23 0508 08/18/23 0522  PLT 133* 135* 149*    Chronic atrial fibrillation  PTA meds- metoprolol  25 mg twice daily, Cardizem  300 mg daily.   Currently continued on Cardizem .  Metoprolol  remains on hold. Rate controlled in 80s.  Blood pressure in low normal range.  Continue to monitor.  If trends up, resume metoprolol  Chronically coagulated with Eliquis .  Was held for surgery.  Resumed from this morning.   GERD Continue Protonix , Pepcid    HLD  Continue statin   COPD not in acute exacerbation Continue DuoNeb, Singulair   Anxiety/depression Zoloft  50 mg daily   Mobility: PT/OT eval pending  Goals of care   Code Status: Full Code     DVT prophylaxis:  SCDs Start: 08/17/23 2012 SCDs Start: 08/17/23 0014 apixaban  (ELIQUIS ) tablet 5 mg   Antimicrobials:  None Fluid: None Consultants: Orthopedics Family Communication: Family at bedside  Status: Inpatient Level of care:  Med-Surg   Patient is from: Home Needs to continue in-hospital care: Pending PT/OT eval Anticipated d/c to: Family prefers home with physical therapy.  Hopefully in next 1 to 2 days    Diet:  Diet Order             Diet Heart Room service appropriate? Yes; Fluid consistency: Thin  Diet effective now                   Scheduled Meds:  acetaminophen   1,000 mg Oral TID   apixaban   5 mg Oral BID   diltiazem   300 mg Oral Daily   docusate sodium   100 mg Oral BID   famotidine   20 mg Oral QHS   feeding supplement  237 mL Oral BID BM   montelukast   10 mg Oral QHS   pantoprazole   80 mg Oral Q1200   rosuvastatin   20 mg Oral Daily    PRN meds: HYDROmorphone  (DILAUDID ) injection, ipratropium-albuterol , methocarbamol  **OR** methocarbamol  (ROBAXIN ) injection, metoCLOPramide  **OR** metoCLOPramide  (REGLAN ) injection, ondansetron  **OR** ondansetron  (ZOFRAN ) IV, oxyCODONE , polyethylene glycol   Infusions:    ceFAZolin  (ANCEF ) IV 2 g (08/18/23 0934)    Antimicrobials: Anti-infectives (From admission, onward)    Start     Dose/Rate Route Frequency Ordered Stop   08/18/23 0000  ceFAZolin  (ANCEF ) IVPB 2g/100 mL premix        2 g 200 mL/hr over 30 Minutes Intravenous Every 8 hours 08/17/23 2011 08/18/23 2359   08/17/23 1749  vancomycin  (VANCOCIN ) powder  Status:  Discontinued          As needed 08/17/23 1749 08/17/23 1819   08/17/23 1510  ceFAZolin  (ANCEF ) 2-4 GM/100ML-% IVPB       Note to Pharmacy: Terrence, Destiny: cabinet override      08/17/23 1510 08/18/23 0314   08/16/23 1845  ceFAZolin  (ANCEF ) IVPB 2g/100 mL premix        2 g 200 mL/hr over 30 Minutes Intravenous Every 8 hours 08/16/23 1830 08/17/23 1106       Objective: Vitals:   08/18/23 0130 08/18/23 0427  BP: 109/60 (!) 105/59  Pulse: 72 84  Resp: 16 17  Temp: 97.8 F (36.6 C) 97.9 F (36.6 C)   SpO2: 96% 96%    Intake/Output Summary (Last 24 hours) at 08/18/2023 1055 Last data filed at 08/18/2023 0428 Gross per 24 hour  Intake 1170 ml  Output 50 ml  Net 1120 ml   Filed Weights   08/16/23 1758  Weight: 73.5 kg   Weight change:  Body mass index is 28.7 kg/m.   Physical Exam: General exam: Pleasant, elderly Caucasian female.  Pain controlled Skin: No rashes, lesions or ulcers. HEENT: Atraumatic, normocephalic, no obvious bleeding Lungs: Clear to auscultation bilaterally, on low-flow oxygen probably for comfort.  Not on oxygen at home CVS: S1, S2, no murmur,   GI/Abd: Soft, nontender, nondistended, bowel sound present,   CNS: Alert, awake, oriented x 3 Psychiatry: Mood appropriate,  Extremities: No pedal edema, no calf tenderness, right upper extremity in sling  Data Review:  I have personally reviewed the laboratory data and studies available.  F/u labs  Unresulted Labs (From admission, onward)     Start     Ordered   08/19/23 0500  Basic metabolic panel  Tomorrow morning,   R        08/18/23 1055   Unscheduled  CBC with Differential/Platelet  Tomorrow morning,   R        08/18/23 1055           Total time spent in review of labs and imaging, patient evaluation, formulation of plan, documentation and communication with family: 45 minutes  Signed, Chapman Rota, MD [facility] 08/18/2023

## 2023-08-18 NOTE — Evaluation (Addendum)
 Occupational Therapy Evaluation Patient Details Name: Krystal [name] MRN: [id number] DOB: Nov 01, 1940 Today's Date: 08/18/2023   History of Present Illness Pt is an 83 y/o female who presents from [facility] with open R supracondylar humerus fracture. She underwent I&D and ORIF on 08/17/2023. PMH significant for Asthma, brain tumor, breast CA, COPD, macular degeneration, osteoporosis, PNA, shingles, crani for excision of brain tumor, mastectomy.   Clinical Impression   Pt c/o minimal pain to RUE. Pt has family support at home, lives in mobile home with ramped entrance. Pt PLOF mod I using RW, ind with ADL/IADLs. Pt currently presents with poor balance, x2 HHA support to maintain balance with ambulation around room. Pt max A for LB ADLs, set up/min A for UB ADLs. Pt at this time is high fall risk, recommending postacute rehab <3hrs/day to improve with functional mobility and independence with ADLs. Will continue to follow acutely to progress as able.       If plan is discharge home, recommend the following: A lot of help with walking and/or transfers;A little help with bathing/dressing/bathroom;Assistance with cooking/housework;Assist for transportation;Help with stairs or ramp for entrance    Functional Status Assessment  Patient has had a recent decline in their functional status and demonstrates the ability to make significant improvements in function in a reasonable and predictable amount of time.  Equipment Recommendations  Other (comment) (defer)    Recommendations for Other Services       Precautions / Restrictions Precautions Precautions: Fall Splint/Cast - Date Prophylactic Dressing Applied (if applicable): 08/17/23 Restrictions Weight Bearing Restrictions Per Provider Order: Yes; RUE NWB    Mobility Bed Mobility Overal bed mobility: Needs Assistance             General bed mobility comments: in recliner    Transfers Overall transfer level: Needs assistance Equipment  used: 2 person hand held assist Transfers: Sit to/from Stand Sit to Stand: Min assist, +2 physical assistance, +2 safety/equipment           General transfer comment: increased time, poor balance, requires x2 HHA for maintaining balance      Balance Overall balance assessment: Needs assistance Sitting-balance support: Feet supported, No upper extremity supported Sitting balance-Leahy Scale: Fair     Standing balance support: No upper extremity supported, During functional activity Standing balance-Leahy Scale: Poor                             ADL either performed or assessed with clinical judgement   ADL Overall ADL's : Needs assistance/impaired Eating/Feeding: Set up;Sitting   Grooming: Set up;Sitting   Upper Body Bathing: Minimal assistance;Sitting   Lower Body Bathing: Maximal assistance;Sitting/lateral leans   Upper Body Dressing : Minimal assistance;Sitting   Lower Body Dressing: Maximal assistance;Sitting/lateral leans   Toilet Transfer: Minimal assistance;+2 for safety/equipment;+2 for physical assistance   Toileting- Clothing Manipulation and Hygiene: Minimal assistance;Sitting/lateral lean       Functional mobility during ADLs: Minimal assistance;+2 for physical assistance;+2 for safety/equipment General ADL Comments: set up/min A for UB ADLs, max A for LB ADLs. Pt x2 HHA for mobility/transfers.     Vision Baseline Vision/History: 1 Wears glasses Ability to See in Adequate Light: 0 Adequate Patient Visual Report: No change from baseline       Perception         Praxis         Pertinent Vitals/Pain Pain Assessment Pain Assessment: Faces Faces Pain Scale: Hurts a little  bit Pain Location: RUE Pain Descriptors / Indicators: Operative site guarding Pain Intervention(s): Monitored during session     Extremity/Trunk Assessment Upper Extremity Assessment Upper Extremity Assessment: RUE deficits/detail RUE Deficits / Details: distal  humerus, olecranon, and radial head fx, s/p ORIF. NWB, able to move shoulder, light elbow ROM. Pt not able to perform elbow flexion or wrist extension, minimal hand flexion/extension, no feeling to L hand currently. Notable swelling to R dorsal hand RUE Sensation: decreased light touch;decreased proprioception RUE Coordination: decreased fine motor;decreased gross motor   Lower Extremity Assessment Lower Extremity Assessment: Defer to PT evaluation   Cervical / Trunk Assessment Cervical / Trunk Assessment: Normal   Communication Communication Communication: Hearing impairment Cueing Techniques: Verbal cues;Gestural cues   Cognition Arousal: Alert Behavior During Therapy: WFL for tasks assessed/performed Overall Cognitive Status: Difficult to assess                                       General Comments       Exercises     Shoulder Instructions      Home Living Family/patient expects to be discharged to:: Private residence Living Arrangements: Children Available Help at Discharge: Family;Available 24 hours/day Type of Home: Mobile home Home Access: Ramped entrance     Home Layout: One level     Bathroom Shower/Tub: Walk-in Pensions Consultant: Standard     Home Equipment: Pharmacist, Hospital (2 wheels);Rollator (4 wheels);Hand held shower head;BSC/3in1 (3 wheeled walker)          Prior Functioning/Environment Prior Level of Function : Independent/Modified Independent             Mobility Comments: Using a walker for all mobility ADLs Comments: Ind with bathing, dressing, laundry        OT Problem List: Decreased strength;Decreased range of motion;Decreased activity tolerance;Impaired balance (sitting and/or standing);Decreased coordination;Decreased safety awareness;Pain;Increased edema;Impaired UE functional use      OT Treatment/Interventions: Self-care/ADL training;Therapeutic exercise;Energy conservation;DME  and/or AE instruction;Therapeutic activities;Patient/family education;Balance training    OT Goals(Current goals can be found in the care plan section) Acute Rehab OT Goals Patient Stated Goal: to manage pain, improve balance OT Goal Formulation: With patient Time For Goal Achievement: 09/01/23 Potential to Achieve Goals: Good  OT Frequency: Min 1X/week    Co-evaluation   Reason for Co-Treatment: For patient/therapist safety;To address functional/ADL transfers PT goals addressed during session: Mobility/safety with mobility;Balance;Strengthening/ROM        AM-PAC OT 6 Clicks Daily Activity     Outcome Measure Help from another person eating meals?: A Little Help from another person taking care of personal grooming?: A Little Help from another person toileting, which includes using toliet, bedpan, or urinal?: A Little Help from another person bathing (including washing, rinsing, drying)?: A Lot Help from another person to put on and taking off regular upper body clothing?: A Little Help from another person to put on and taking off regular lower body clothing?: A Lot 6 Click Score: 16   End of Session Equipment Utilized During Treatment: Gait belt Nurse Communication: Mobility status  Activity Tolerance: Patient tolerated treatment well Patient left: in chair;with call bell/phone within reach  OT Visit Diagnosis: Unsteadiness on feet (R26.81);Other abnormalities of gait and mobility (R26.89);Repeated falls (R29.6);Muscle weakness (generalized) (M62.81);History of falling (Z91.81);Pain Pain - Right/Left: Right Pain - part of body: Arm;Hand  Time: 8846-8777 OT Time Calculation (min): 29 min Charges:  OT General Charges $OT Visit: 1 Visit OT Evaluation $OT Eval Moderate Complexity: 1 [address], OTR/L   Krystal [name] 08/18/2023, 3:59 PM

## 2023-08-19 DIAGNOSIS — S42401B Unspecified fracture of lower end of right humerus, initial encounter for open fracture: Secondary | ICD-10-CM | POA: Diagnosis not present

## 2023-08-19 LAB — CBC WITH DIFFERENTIAL/PLATELET
Abs Immature Granulocytes: 0.05 10*3/uL (ref 0.00–0.07)
Basophils Absolute: 0 10*3/uL (ref 0.0–0.1)
Basophils Relative: 0 %
Eosinophils Absolute: 0 10*3/uL (ref 0.0–0.5)
Eosinophils Relative: 0 %
HCT: 25.8 % — ABNORMAL LOW (ref 36.0–46.0)
Hemoglobin: 8.5 g/dL — ABNORMAL LOW (ref 12.0–15.0)
Immature Granulocytes: 1 %
Lymphocytes Relative: 12 %
Lymphs Abs: 0.9 10*3/uL (ref 0.7–4.0)
MCH: 29.5 pg (ref 26.0–34.0)
MCHC: 32.9 g/dL (ref 30.0–36.0)
MCV: 89.6 fL (ref 80.0–100.0)
Monocytes Absolute: 0.9 10*3/uL (ref 0.1–1.0)
Monocytes Relative: 11 %
Neutro Abs: 6 10*3/uL (ref 1.7–7.7)
Neutrophils Relative %: 76 %
Platelets: 190 10*3/uL (ref 150–400)
RBC: 2.88 MIL/uL — ABNORMAL LOW (ref 3.87–5.11)
RDW: 14.6 % (ref 11.5–15.5)
WBC: 7.8 10*3/uL (ref 4.0–10.5)
nRBC: 0 % (ref 0.0–0.2)

## 2023-08-19 LAB — BASIC METABOLIC PANEL
Anion gap: 5 (ref 5–15)
BUN: 30 mg/dL — ABNORMAL HIGH (ref 8–23)
CO2: 30 mmol/L (ref 22–32)
Calcium: 8.4 mg/dL — ABNORMAL LOW (ref 8.9–10.3)
Chloride: 106 mmol/L (ref 98–111)
Creatinine, Ser: 0.64 mg/dL (ref 0.44–1.00)
GFR, Estimated: >60 mL/min (ref >60)
Glucose, Bld: 176 mg/dL — ABNORMAL HIGH (ref 70–99)
Potassium: 4.1 mmol/L (ref 3.5–5.1)
Sodium: 141 mmol/L (ref 135–145)

## 2023-08-19 MED ORDER — ACETAMINOPHEN 500 MG PO TABS: 1000.0000 mg | ORAL_TABLET | Freq: Four times a day (QID) | ORAL | Status: AC | PRN

## 2023-08-19 MED ORDER — OXYCODONE HCL 5 MG PO TABS: 5.0000 mg | ORAL_TABLET | Freq: Four times a day (QID) | ORAL | 0 refills | Status: DC | PRN

## 2023-08-19 MED ORDER — DILTIAZEM HCL ER COATED BEADS 180 MG PO CP24
180.0000 mg | ORAL_CAPSULE | Freq: Every day | ORAL | Status: DC
  Administered 2023-08-20 – 2023-08-21 (×2): 180 mg via ORAL
  Filled 2023-08-19 (×2): qty 1

## 2023-08-19 MED ORDER — DILTIAZEM HCL ER COATED BEADS 120 MG PO CP24: 240.0000 mg | ORAL_CAPSULE | Freq: Every day | ORAL | Status: DC

## 2023-08-19 MED ORDER — METHOCARBAMOL 500 MG PO TABS: 500.0000 mg | ORAL_TABLET | Freq: Three times a day (TID) | ORAL | 0 refills | Status: AC | PRN

## 2023-08-19 NOTE — Plan of Care (Signed)
  Problem: Education: °Goal: Knowledge of General Education information will improve °Description: Including pain rating scale, medication(s)/side effects and non-pharmacologic comfort measures °Outcome: Progressing °  °Problem: Health Behavior/Discharge Planning: °Goal: Ability to manage health-related needs will improve °Outcome: Progressing °  °Problem: Clinical Measurements: °Goal: Ability to maintain clinical measurements within normal limits will improve °Outcome: Progressing °Goal: Will remain free from infection °Outcome: Progressing °  °Problem: Nutrition: °Goal: Adequate nutrition will be maintained °Outcome: Progressing °  °Problem: Elimination: °Goal: Will not experience complications related to bowel motility °Outcome: Progressing °  °

## 2023-08-19 NOTE — Progress Notes (Signed)
 Krystal [name]  ZOX:[id number] DOB: [birth year] DOA: 08/16/2023 PCP: Dettinger, Lucio Sabin, MD    Brief Narrative:  83 year old with a history of HLD, chronic atrial fibrillation on Eliquis , COPD, and osteoporosis who suffered a simple mechanical fall and presented to the ER 1/10 at [facility].  She was initially diagnosed with a fracture of the right arm, placed in a sling, and discharged home.  After returning home family noticed significant bleeding from a wound on her right arm as well as difficulty using her right hand and therefore she presented to the ER at [facility] 1/12.  There a CT of the right upper extremity noted an open elbow fracture without dislocation, a markedly comminuted and dorsally and laterally displaced supracondylar distal humeral fracture, displaced and comminuted avulsion fracture along the olecranon, and an acute nondisplaced radial head and neck fracture.  Orthopedics recommended transfer of the patient to [facility] to be seen by the orthopedic trauma team.  The patient was able to be successfully transferred and 01/13 underwent surgical correction of her complex right humerus fracture.  Goals of Care:   Code Status: Full Code   DVT prophylaxis: SCDs Start: 08/17/23 2012 SCDs Start: 08/17/23 0014 apixaban  (ELIQUIS ) tablet 5 mg   Interim Hx: Afebrile.  Vital signs stable.  Resting comfortably in bed.  Denies shortness of breath chest pain nausea or vomiting.  In good spirits.  Assessment & Plan:  Open right supracondylar humeral fracture Status post I&D and ORIF 1/13 per Dr. Curtiss Dowdy - postop care per orthopedic surgery as follows:  Weightbearing: NWB RUE ROM: Okay for gentle elbow motion.  Unrestricted shoulder motion Incisional and dressing care: Reinforce dressings as needed  Showering: Hold off on showering for now.  Okay for bed bath.  Orthopedic device(s): Sling as needed for comfort Pain management:  1. Tylenol  1000 mg 3 times daily  2. Robaxin  500 mg q 6  hours PRN 3. Oxycodone  5 mg q 4 hours PRN 4. Dilaudid  0.5 mg q 4 hours PRN VTE prophylaxis:  Restart home dose Eliquis  , SCDs ID:  Ancef  2gm post op per open fracture protocol Impediments to Fracture Healing: Vitamin D  48.1 when last checked February 2023.  Is on home dose vitamin D3 5000 units daily at baseline Dispo: PT/OT evaluation today.  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, with symptoms acutely worsening with her recurrent injury  Mild acute blood loss anemia Expected blood loss given this clinical situation -hemoglobin presently stable  Mild hypokalemia Due to diminished intake -corrected with supplementation  Hyponatremia Due to simple volume depletion -resolved with volume expansion  Mild thrombocytopenia Likely due to consumption in setting of acute injury -stable  Chronic atrial fibrillation Usual Eliquis  has been resumed -continue usual metoprolol  and Cardizem  -rate well-controlled  HLD Continue usual statin  COPD Well compensated at present  Anxiety/depression Continue usual Zoloft  dose   Family Communication: No family present at time of eval today Disposition: Medically stable for discharge to SNF for rehab stay   Objective: Blood pressure (!) 97/52, pulse 85, temperature 98.2 F (36.8 C), resp. rate 16, height 5\' 3"  (1.6 m), weight 73.5 kg, SpO2 92%.  Intake/Output Summary (Last 24 hours) at 08/19/2023 8119 Last data  filed at 08/18/2023 1600 Gross per 24 hour  Intake 221.06 ml  Output --  Net 221.06 ml   Filed Weights   08/16/23 1758  Weight: 73.5 kg    Examination: General: No acute respiratory distress Lungs: Clear to auscultation bilaterally without wheezes or  crackles Cardiovascular: Regular rate and rhythm without murmur gallop or rub normal S1 and S2 Abdomen: Nontender, nondistended, soft, bowel sounds positive, no rebound, no ascites, no appreciable mass Extremities: No significant cyanosis, clubbing, or edema bilateral lower extremities  CBC: Recent Labs  Lab 08/16/23 1934 08/17/23 0508 08/18/23 0522 08/19/23 0650  WBC 6.3 5.3 5.6 7.8  NEUTROABS 4.0  --   --  6.0  HGB 10.8* 10.3* 10.1* 8.5*  HCT 33.4* 31.5* 31.4* 25.8*  MCV 91.0 91.0 92.1 89.6  PLT 133* 135* 149* 190   Basic Metabolic Panel: Recent Labs  Lab 08/17/23 0508 08/18/23 0522 08/19/23 0650  NA 130* 138 141  K 3.4* 4.2 4.1  CL 98 104 106  CO2 24 21* 30  GLUCOSE 101* 158* 176*  BUN 16 18 30*  CREATININE 0.56 0.85 0.64  CALCIUM  8.2* 8.4* 8.4*  MG 1.9 1.8  --   PHOS 3.0  --   --    GFR: Estimated Creatinine Clearance: 50.2 mL/min (by C-G formula based on SCr of 0.64 mg/dL).   Scheduled Meds:  acetaminophen   1,000 mg Oral TID   apixaban   5 mg Oral BID   diltiazem   300 mg Oral Daily   docusate sodium   100 mg Oral BID   famotidine   20 mg Oral QHS   feeding supplement  237 mL Oral BID BM   montelukast   10 mg Oral QHS   pantoprazole   80 mg Oral Q1200   rosuvastatin   20 mg Oral Daily     LOS: 3 days   Abbe Abate, MD [facility] Office  [phone] Pager - Text Page per Tilford Foley  If 7PM-7AM, please contact night-coverage per Amion 08/19/2023, 9:52 AM

## 2023-08-19 NOTE — Discharge Instructions (Signed)
 Orthopaedic Trauma Service Discharge Instructions   General Discharge Instructions  WEIGHT BEARING STATUS:non-weightbearing right upper extremity  RANGE OF MOTION/ACTIVITY: ok for shoulder, elbow, wrist motion as tolerated  Wound Care: You may remove your surgical dressing on Thursday (08/20/23). Incisions can be left open to air if there is no drainage. Once the incision is completely dry and without drainage, it may be left open to air out.  Showering may begin Friday (08/21/23).  Clean incision gently with soap and water.  DVT/PE prophylaxis:  Home dose Eliquis   Diet: as you were eating previously.  Can use over the counter stool softeners and bowel preparations, such as Miralax , to help with bowel movements.  Narcotics can be constipating.  Be sure to drink plenty of fluids  PAIN MEDICATION USE AND EXPECTATIONS  You have likely been given narcotic medications to help control your pain.  After a traumatic event that results in an fracture (broken bone) with or without surgery, it is ok to use narcotic pain medications to help control one's pain.  We understand that everyone responds to pain differently and each individual patient will be evaluated on a regular basis for the continued need for narcotic medications. Ideally, narcotic medication use should last no more than 6-8 weeks (coinciding with fracture healing).   As a patient it is your responsibility as well to monitor narcotic medication use and report the amount and frequency you use these medications when you come to your office visit.   We would also advise that if you are using narcotic medications, you should take a dose prior to therapy to maximize you participation.  IF YOU ARE ON NARCOTIC MEDICATIONS IT IS NOT PERMISSIBLE TO OPERATE A MOTOR VEHICLE (MOTORCYCLE/CAR/TRUCK/MOPED) OR HEAVY MACHINERY DO NOT MIX NARCOTICS WITH OTHER CNS (CENTRAL NERVOUS SYSTEM) DEPRESSANTS SUCH AS ALCOHOL   STOP SMOKING OR USING NICOTINE  PRODUCTS!!!!  As discussed nicotine severely impairs your body's ability to heal surgical and traumatic wounds but also impairs bone healing.  Wounds and bone heal by forming microscopic blood vessels (angiogenesis) and nicotine is a vasoconstrictor (essentially, shrinks blood vessels).  Therefore, if vasoconstriction occurs to these microscopic blood vessels they essentially disappear and are unable to deliver necessary nutrients to the healing tissue.  This is one modifiable factor that you can do to dramatically increase your chances of healing your injury.    (This means no smoking, no nicotine gum, patches, etc)  DO NOT USE NONSTEROIDAL ANTI-INFLAMMATORY DRUGS (NSAID'S)  Using products such as Advil (ibuprofen), Aleve (naproxen), Motrin (ibuprofen) for additional pain control during fracture healing can delay and/or prevent the healing response.  If you would like to take over the counter (OTC) medication, Tylenol  (acetaminophen ) is ok.  However, some narcotic medications that are given for pain control contain acetaminophen  as well. Therefore, you should not exceed more than 4000 mg of tylenol  in a day if you do not have liver disease.  Also note that there are may OTC medicines, such as cold medicines and allergy medicines that my contain tylenol  as well.  If you have any questions about medications and/or interactions please ask your doctor/PA or your pharmacist.      ICE AND ELEVATE INJURED/OPERATIVE EXTREMITY  Using ice and elevating the injured extremity above your heart can help with swelling and pain control.  Icing in a pulsatile fashion, such as 20 minutes on and 20 minutes off, can be followed.    Do not place ice directly on skin. Make sure there  is a barrier between to skin and the ice pack.    Using frozen items such as frozen peas works well as the conform nicely to the are that needs to be iced.  USE AN ACE WRAP OR TED HOSE FOR SWELLING CONTROL  In addition to icing and elevation,  Ace wraps or TED hose are used to help limit and resolve swelling.  It is recommended to use Ace wraps or TED hose until you are informed to stop.    When using Ace Wraps start the wrapping distally (farthest away from the body) and wrap proximally (closer to the body)   Example: If you had surgery on your leg or thing and you do not have a splint on, start the ace wrap at the toes and work your way up to the thigh        If you had surgery on your upper extremity and do not have a splint on, start the ace wrap at your fingers and work your way up to the upper arm   CALL THE OFFICE WITH ANY QUESTIONS OR CONCERNS: [phone]   VISIT OUR WEBSITE FOR ADDITIONAL INFORMATION: [email or web address]   Discharge Wound Care Instructions  Do NOT apply any ointments, solutions or lotions to pin sites or surgical wounds.  These prevent needed drainage and even though solutions like hydrogen peroxide kill bacteria, they also damage cells lining the pin sites that help fight infection.  Applying lotions or ointments can keep the wounds moist and can cause them to breakdown and open up as well. This can increase the risk for infection. When in doubt call the office.  Surgical incisions should be dressed daily.  If any drainage is noted, use one layer of adaptic or Mepitel, then gauze, Kerlix, and an ace wrap. - These dressing supplies should be available at local medical supply stores ([facility], [facility], etc) as well as Insurance claims handler ([facility], [facility], [place], etc)  Once the incision is completely dry and without drainage, it may be left open to air out.  Showering may begin 36-48 hours later.  Cleaning gently with soap and water.

## 2023-08-19 NOTE — Progress Notes (Addendum)
 Occupational Therapy Treatment Patient Details Name: Krystal [name] MRN: [id number] DOB: 1941/03/21 Today's Date: 08/19/2023   History of present illness Pt is an 83 y/o female who presents from [facility] with open R supracondylar humerus fracture. She underwent I&D and ORIF on 08/17/2023. PMH significant for Asthma, brain tumor, breast CA, COPD, macular degeneration, osteoporosis, PNA, shingles, crani for excision of brain tumor, mastectomy.   OT comments  Pt c/o little to no pain at rest, some pain with RUE PROM to end range. Pt continues to present with R wrist drop, no active elbow flexion, PROM WFLs. Decreased sensation to R hand digits 2-5. Retrograde massage perform to R hand, Pt tolerated well. Spoke with PA about wrist cock up splint during session, decided to order a prefab wrist cock up for RUE. Pt able to ambulate around room with improved balance from initial eval, continues to need close contact for safety with ambulation due to high fall risk. Postacute rehab <3hrs/day still recommended to maximize functional independence/participation, will continue to see acutely.       If plan is discharge home, recommend the following:  A lot of help with walking and/or transfers;A little help with bathing/dressing/bathroom;Assistance with cooking/housework;Assist for transportation;Help with stairs or ramp for entrance   Equipment Recommendations  Other (comment) (defer)    Recommendations for Other Services      Precautions / Restrictions Precautions Precautions: Fall Restrictions Weight Bearing Restrictions Per Provider Order: Yes RUE Weight Bearing Per Provider Order: Non weight bearing       Mobility Bed Mobility               General bed mobility comments: in recliner    Transfers Overall transfer level: Needs assistance Equipment used: 2 person hand held assist Transfers: Sit to/from Stand, Bed to chair/wheelchair/BSC Sit to Stand: Contact guard assist            General transfer comment: CGA x2 HHA for safety with ambulation.     Balance Overall balance assessment: Needs assistance Sitting-balance support: Feet supported, No upper extremity supported Sitting balance-Leahy Scale: Good     Standing balance support: No upper extremity supported, During functional activity Standing balance-Leahy Scale: Fair Standing balance comment: better balance today, HHA x1 standing, x2 for taking steps                           ADL either performed or assessed with clinical judgement   ADL Overall ADL's : Needs assistance/impaired Eating/Feeding: Set up;Sitting   Grooming: Set up;Sitting           Upper Body Dressing : Minimal assistance;Sitting       Toilet Transfer: Minimal assistance;+2 for safety/equipment;+2 for physical assistance   Toileting- Clothing Manipulation and Hygiene: Minimal assistance;Sitting/lateral lean       Functional mobility during ADLs: Minimal assistance;+2 for physical assistance;+2 for safety/equipment General ADL Comments: Pt doing better with balance during transfers and ambulation, still min A x2 for assist for safety due to high fall risk. Pt RUE limits participation in BL tasks.    Extremity/Trunk Assessment Upper Extremity Assessment Upper Extremity Assessment: RUE deficits/detail;Left hand dominant RUE Deficits / Details: distal humerus, olecranon, and radial head fx, s/p ORIF. NWB, able to move shoulder, light elbow ROM. Pt still not able to perform elbow flexion or wrist extension, minimal hand flexion/extension, no feeling to digits 2-5, swelling still present RUE Sensation: decreased light touch RUE Coordination: decreased gross motor;decreased fine motor  Vision       Perception     Praxis      Cognition Arousal: Alert Behavior During Therapy: WFL for tasks assessed/performed Overall Cognitive Status: Within Functional Limits for tasks assessed                                           Exercises      Shoulder Instructions       General Comments      Pertinent Vitals/ Pain       Pain Assessment Pain Assessment: Faces Faces Pain Scale: Hurts a little bit Pain Location: RUE with PROM Pain Descriptors / Indicators: Discomfort, Operative site guarding Pain Intervention(s): Monitored during session  Home Living                                          Prior Functioning/Environment              Frequency  Min 1X/week        Progress Toward Goals  OT Goals(current goals can now be found in the care plan section)  Progress towards OT goals: Progressing toward goals  Acute Rehab OT Goals Patient Stated Goal: to improve RUE strength OT Goal Formulation: With patient Time For Goal Achievement: 09/01/23 Potential to Achieve Goals: Good ADL Goals Pt Will Perform Upper Body Dressing: with set-up Pt Will Transfer to Toilet: with supervision;with set-up Pt Will Perform Toileting - Clothing Manipulation and hygiene: with set-up;with supervision  Plan      Co-evaluation                 AM-PAC OT "6 Clicks" Daily Activity     Outcome Measure   Help from another person eating meals?: A Little Help from another person taking care of personal grooming?: A Little Help from another person toileting, which includes using toliet, bedpan, or urinal?: A Little Help from another person bathing (including washing, rinsing, drying)?: A Lot Help from another person to put on and taking off regular upper body clothing?: A Little Help from another person to put on and taking off regular lower body clothing?: A Lot 6 Click Score: 16    End of Session Equipment Utilized During Treatment: Gait belt  OT Visit Diagnosis: Unsteadiness on feet (R26.81);Other abnormalities of gait and mobility (R26.89);Repeated falls (R29.6);Muscle weakness (generalized) (M62.81);History of falling (Z91.81);Pain Pain - Right/Left:  Right Pain - part of body: Arm;Hand   Activity Tolerance Patient tolerated treatment well   Patient Left in chair;with call bell/phone within reach;with chair alarm set   Nurse Communication Mobility status        Time: 1610-9604 OT Time Calculation (min): 28 min  Charges: OT General Charges $OT Visit: 1 Visit OT Treatments $Therapeutic Activity: 23-37 mins  [address] OTR/L   Scherry Curtis 08/19/2023, 2:46 PM

## 2023-08-19 NOTE — Care Management Important Message (Signed)
 Important Message  Patient Details  Name: RIYANA FLAMING MRN: [id number] Date of Birth: 06-06-1941   Important Message Given:  Yes - Medicare IM     Wynonia Hedges 08/19/2023, 4:00 PM

## 2023-08-19 NOTE — TOC Progression Note (Addendum)
 Transition of Care [facility]) - Progression Note    Patient Details  Name: Krystal [name] MRN: [id number] Date of Birth: 1941-01-20  [facility]) CM/SW Contact  Elspeth Hals, LCSW Phone Number: 08/19/2023, 12:20 PM  Clinical Narrative:    [facility] offer bed.     CSW spoke with pt son Pascual Bonds, who confirms that he is in agreement with plan to DC to [facility] SNF.    1400: CSW confirmed with [facility]  [place] request submitted by Shelvy Dickens CMA: [phone]   Expected Discharge Plan: Skilled Nursing Facility Barriers to Discharge: Continued Medical Work up, SNF Pending bed offer  Expected Discharge Plan and Services In-house Referral: Clinical Social Work   Post Acute Care Choice: Skilled Nursing Facility Living arrangements for the past 2 months: Single Family Home                                       Social Determinants of Health (SDOH) Interventions SDOH Screenings   Food Insecurity: No Food Insecurity (08/17/2023)  Housing: Low Risk  (08/17/2023)  Transportation Needs: No Transportation Needs (08/17/2023)  Utilities: Not At Risk (08/17/2023)  Alcohol Screen: Low Risk  (08/13/2022)  Depression (PHQ2-9): Low Risk  (03/16/2023)  Financial Resource Strain: Low Risk  (08/13/2022)  Physical Activity: Sufficiently Active (08/13/2022)  Social Connections: Socially Isolated (08/17/2023)  Stress: No Stress Concern Present (08/13/2022)  Tobacco Use: Medium Risk (08/17/2023)    Readmission Risk Interventions    08/17/2023    9:24 AM 03/21/2021   12:23 PM  Readmission Risk Prevention Plan  Transportation Screening Complete Complete  Home Care Screening Complete Complete  Medication Review (RN CM) Complete Complete

## 2023-08-19 NOTE — Progress Notes (Addendum)
 Orthopaedic Trauma Progress Note  SUBJECTIVE: Doing well today. Pain controlled. Primarily using Tylenol  for pain control. Patient notes that since her injury on 08/15/23 she has been unable to move her wrist. Prior to this, notes not issues with motor function through the wrist/hand. No chest pain. No SOB. No nausea/vomiting. No other complaints.  No family at bedside currently.  OBJECTIVE:  Vitals:   08/19/23 0810 08/19/23 1334  BP: (!) 97/52 (!) 102/47  Pulse: 85 (!) 109  Resp: 16 16  Temp: 98.2 F (36.8 C) 98.3 F (36.8 C)  SpO2: 92% 95%    General: Sitting comfortably in bedside chair, no acute distress Respiratory: No increased work of breathing. CTA  Right upper extremity: Sling in place.  Dressing clean, dry, intact.  Soreness about the elbow as expected.  Notable swelling though the hand. No significant tenderness throughout the forearm.  Tolerates gentle passive wrist range of motion.  Actively unable to fully extend the wrist.  Able to wiggle the fingers.  Very weak thumb extension. This is consistent with radial nerve palsy. Difficulty with active elbow flexion. Passively she tolerates flexion to about 95 degrees. Endorses sensation to light touch over all fingers but notes it is slightly diminished across all digits compared to contralateral hand.  Endorses sensation through the axillary nerve distribution.  +Radial pulse  IMAGING: Stable post op imaging.   LABS:  Results for orders placed or performed during the hospital encounter of 08/16/23 (from the past 24 hours)  Basic metabolic panel     Status: Abnormal   Collection Time: 08/19/23  6:50 AM  Result Value Ref Range   Sodium 141 135 - 145 mmol/L   Potassium 4.1 3.5 - 5.1 mmol/L   Chloride 106 98 - 111 mmol/L   CO2 30 22 - 32 mmol/L   Glucose, Bld 176 (H) 70 - 99 mg/dL   BUN 30 (H) 8 - 23 mg/dL   Creatinine, Ser 1.61 0.44 - 1.00 mg/dL   Calcium  8.4 (L) 8.9 - 10.3 mg/dL   GFR, Estimated >09 >60 mL/min   Anion gap 5  5 - 15  CBC with Differential/Platelet     Status: Abnormal   Collection Time: 08/19/23  6:50 AM  Result Value Ref Range   WBC 7.8 4.0 - 10.5 K/uL   RBC 2.88 (L) 3.87 - 5.11 MIL/uL   Hemoglobin 8.5 (L) 12.0 - 15.0 g/dL   HCT 45.4 (L) 09.8 - 11.9 %   MCV 89.6 80.0 - 100.0 fL   MCH 29.5 26.0 - 34.0 pg   MCHC 32.9 30.0 - 36.0 g/dL   RDW 14.7 82.9 - 56.2 %   Platelets 190 150 - 400 K/uL   nRBC 0.0 0.0 - 0.2 %   Neutrophils Relative % 76 %   Neutro Abs 6.0 1.7 - 7.7 K/uL   Lymphocytes Relative 12 %   Lymphs Abs 0.9 0.7 - 4.0 K/uL   Monocytes Relative 11 %   Monocytes Absolute 0.9 0.1 - 1.0 K/uL   Eosinophils Relative 0 %   Eosinophils Absolute 0.0 0.0 - 0.5 K/uL   Basophils Relative 0 %   Basophils Absolute 0.0 0.0 - 0.1 K/uL   Immature Granulocytes 1 %   Abs Immature Granulocytes 0.05 0.00 - 0.07 K/uL    ASSESSMENT: Krystal [name] is a 83 y.o. female, 2 Days Post-Op s/p OPEN REDUCTION INTERNAL FIXATION RIGHT DISTAL HUMERUS FRACTURE  CV/Blood loss: Acute blood loss anemia, Hgb 8.5 this AM. Hemodynamically stable/  PLAN: Weightbearing: NWB RUE ROM: Okay for gentle elbow motion.  Unrestricted shoulder motion Incisional and dressing care: Reinforce dressings as needed  Showering: Hold off on showering for now.  Okay for bed bath.  Orthopedic device(s): Sling as needed for comfort Pain management:  1. Tylenol  1000 mg 3 times daily  2. Robaxin  500 mg q 6 hours PRN 3. Oxycodone  5 mg q 4 hours PRN 4. Dilaudid  0.5 mg q 4 hours PRN VTE prophylaxis:  Eliquis  , SCDs ID:  Ancef  2gm post op per open fracture protocol completed Foley/Lines:  No foley, KVO IVFs Impediments to Fracture Healing: Vitamin D  48.1 when last checked February 2023.  Is on home dose vitamin D3 5000 units daily at baseline Dispo: Continue working on passive and active motion of the right wrist and hand. Order placed for velcro wrist splint for radial nerve palsy. PT/OT evaluation ongoing, recommending SNF.  Patient and family in agreement. Ok for d/c from ortho standpoint. Once swelling improved, could discuss thermoplast cock up wrist splint on outpatient basis  D/C recommendations: -Oxycodone  and Tylenol  for pain control -Home dose Eliquis  for DVT prophylaxis -Continue home dose Vit D supplementation  Follow - up plan: 2 weeks after discharge for wound check and repeat x-rays   Contact information:  Katheryne Pane MD, Alona Jamaica PA-C. After hours and holidays please check [email or web address] for group call information for [facility]   Edilia Gordon, PA-C [phone] (office) [email or web address]

## 2023-08-19 NOTE — Progress Notes (Signed)
 Mobility Specialist Progress Note:    08/19/23 1226  Mobility  Activity Transferred from bed to chair  Level of Assistance Moderate assist, patient does 50-74%  Assistive Device Other (Comment) (HHA)  Distance Ambulated (ft) 4 ft  RUE Weight Bearing Per Provider Order NWB  Activity Response Tolerated well  Mobility Referral Yes  Mobility visit 1 Mobility  Mobility Specialist Start Time (ACUTE ONLY) 1131  Mobility Specialist Stop Time (ACUTE ONLY) 1142  Mobility Specialist Time Calculation (min) (ACUTE ONLY) 11 min   Pt received in bed and agreeable. Required minA to come EOB. Required modA to stand and pivot to chair d/t balance deficits. No complaints throughout. Pt left in chair with call bell and all needs met. Chair alarm on and family present.  D'Vante Nolon Baxter Mobility Specialist Please contact via Special educational needs teacher or Rehab office at [phone]

## 2023-08-19 NOTE — Plan of Care (Signed)
   Problem: Education: Goal: Knowledge of General Education information will improve Description Including pain rating scale, medication(s)/side effects and non-pharmacologic comfort measures Outcome: Progressing   Problem: Health Behavior/Discharge Planning: Goal: Ability to manage health-related needs will improve Outcome: Progressing

## 2023-08-19 NOTE — Progress Notes (Signed)
 Orthopedic Tech Progress Note Patient Details:  Krystal [name] 07/28/1941 [id number]  Ortho Devices Type of Ortho Device: Velcro wrist splint Ortho Device/Splint Location: RUE Ortho Device/Splint Interventions: Ordered, Application, Adjustment   Post Interventions Patient Tolerated: Well Instructions Provided: Care of device  Kermitt Pedlar 08/19/2023, 3:38 PM

## 2023-08-20 DIAGNOSIS — S42401B Unspecified fracture of lower end of right humerus, initial encounter for open fracture: Secondary | ICD-10-CM | POA: Diagnosis not present

## 2023-08-20 LAB — CBC
HCT: 25.2 % — ABNORMAL LOW (ref 36.0–46.0)
Hemoglobin: 8 g/dL — ABNORMAL LOW (ref 12.0–15.0)
MCH: 29 pg (ref 26.0–34.0)
MCHC: 31.7 g/dL (ref 30.0–36.0)
MCV: 91.3 fL (ref 80.0–100.0)
Platelets: 193 10*3/uL (ref 150–400)
RBC: 2.76 MIL/uL — ABNORMAL LOW (ref 3.87–5.11)
RDW: 14.9 % (ref 11.5–15.5)
WBC: 5.8 10*3/uL (ref 4.0–10.5)
nRBC: 0.3 % — ABNORMAL HIGH (ref 0.0–0.2)

## 2023-08-20 LAB — MAGNESIUM: Magnesium: 2.1 mg/dL (ref 1.7–2.4)

## 2023-08-20 LAB — BASIC METABOLIC PANEL
Anion gap: 6 (ref 5–15)
BUN: 26 mg/dL — ABNORMAL HIGH (ref 8–23)
CO2: 30 mmol/L (ref 22–32)
Calcium: 8.4 mg/dL — ABNORMAL LOW (ref 8.9–10.3)
Chloride: 103 mmol/L (ref 98–111)
Creatinine, Ser: 0.62 mg/dL (ref 0.44–1.00)
GFR, Estimated: >60 mL/min (ref >60)
Glucose, Bld: 141 mg/dL — ABNORMAL HIGH (ref 70–99)
Potassium: 4 mmol/L (ref 3.5–5.1)
Sodium: 139 mmol/L (ref 135–145)

## 2023-08-20 NOTE — Plan of Care (Signed)
  Problem: Education: Goal: Knowledge of General Education information will improve Description Including pain rating scale, medication(s)/side effects and non-pharmacologic comfort measures Outcome: Progressing   

## 2023-08-20 NOTE — Evaluation (Addendum)
Occupational Therapy Evaluation Patient Details Name: Krystal [name] MRN: [id number] DOB: [birth year] Today's Date: 08/20/2023   History of Present Illness Pt is an 83 y/o female who presents from [facility] with open R supracondylar humerus fracture. She underwent I&D and ORIF on 08/17/2023. PMH significant for Asthma, brain tumor, breast CA, COPD, macular degeneration, osteoporosis, PNA, shingles, crani for excision of brain tumor, mastectomy.   Clinical Impression   Pt feeling well, mild-moderate pain to RUE, slight improvement from yesterday. Notable decreased swelling, bandages were loose, reapplied and tightened to improve blood flow/reduce swelling.  Retrograde massage performed to R hand, distal forearm, Pt tolerated well. Pt R hand grip showing improvement.   Pt continues to have R wrist drop, radial nerve palsy noted on admission. Prefab wrist cock up splint ordered and applied yesterday, comfortable to Pt and assists with proper positioning. Pt also has no AROM at biceps, possible proximal belly buldge at biceps indicating possible muscle tear, difficult to assess due to dressing applied to mid upper arm, PA informed. Will continue to follow acutely, DC to postacute rehab still appropriate      If plan is discharge home, recommend the following: A lot of help with walking and/or transfers;A little help with bathing/dressing/bathroom;Assistance with cooking/housework;Assist for transportation;Help with stairs or ramp for entrance    Functional Status Assessment     Equipment Recommendations  Other (comment) (defer)    Recommendations for Other Services       Precautions / Restrictions Precautions Precautions: Fall Restrictions Weight Bearing Restrictions Per Provider Order: Yes RUE Weight Bearing Per Provider Order: Non weight bearing      Mobility Bed Mobility               General bed mobility comments: in recliner    Transfers Overall transfer level: Needs  assistance Equipment used: 1 person hand held assist Transfers: Sit to/from Stand, Bed to chair/wheelchair/BSC Sit to Stand: Contact guard assist     Step pivot transfers: Contact guard assist     General transfer comment: CGA for safety HHA      Balance Overall balance assessment: Needs assistance Sitting-balance support: Feet supported, No upper extremity supported Sitting balance-Leahy Scale: Good     Standing balance support: No upper extremity supported, During functional activity Standing balance-Leahy Scale: Fair Standing balance comment: better balance today, HHA x1 standing, x2 for taking steps                           ADL either performed or assessed with clinical judgement   ADL                                               Vision         Perception         Praxis         Pertinent Vitals/Pain Pain Assessment Pain Assessment: 0-10 Pain Score: 3  Pain Location: RUE with PROM Pain Descriptors / Indicators: Discomfort, Operative site guarding Pain Intervention(s): Monitored during session     Extremity/Trunk Assessment Upper Extremity Assessment Upper Extremity Assessment: RUE deficits/detail RUE Deficits / Details: swelling doing better, fair grip strength, continues to display wrist drop and no AROM with elbow flexoin, radial nerve palsy previosly notated in documents from admission. no muscle twitch present at wrist extensors or biceps.  possible distal biceps tear with muscle belly receded proximally? RUE Sensation: decreased light touch RUE Coordination: decreased gross motor;decreased fine motor           Communication     Cognition Arousal: Alert Behavior During Therapy: WFL for tasks assessed/performed Overall Cognitive Status: Within Functional Limits for tasks assessed                                       General Comments       Exercises     Shoulder Instructions      Home  Living                                          Prior Functioning/Environment                          OT Problem List:        OT Treatment/Interventions:      OT Goals(Current goals can be found in the care plan section) Acute Rehab OT Goals Patient Stated Goal: to manage RUE pain OT Goal Formulation: With patient Time For Goal Achievement: 09/01/23 Potential to Achieve Goals: Good ADL Goals Pt Will Perform Upper Body Dressing: with set-up Pt Will Transfer to Toilet: with supervision;with set-up Pt Will Perform Toileting - Clothing Manipulation and hygiene: with set-up;with supervision  OT Frequency: Min 1X/week    Co-evaluation              AM-PAC OT "6 Clicks" Daily Activity     Outcome Measure Help from another person eating meals?: A Little Help from another person taking care of personal grooming?: A Little Help from another person toileting, which includes using toliet, bedpan, or urinal?: A Little Help from another person bathing (including washing, rinsing, drying)?: A Lot Help from another person to put on and taking off regular upper body clothing?: A Little Help from another person to put on and taking off regular lower body clothing?: A Lot 6 Click Score: 16   End of Session Equipment Utilized During Treatment: Gait belt Nurse Communication: Mobility status  Activity Tolerance: Patient tolerated treatment well Patient left: in chair;with call bell/phone within reach;with chair alarm set  OT Visit Diagnosis: Unsteadiness on feet (R26.81);Other abnormalities of gait and mobility (R26.89);Repeated falls (R29.6);Muscle weakness (generalized) (M62.81);History of falling (Z91.81);Pain Pain - Right/Left: Right Pain - part of body: Arm;Hand                Time: 1355-1415 OT Time Calculation (min): 20 min Charges:  OT General Charges $OT Visit: 1 Visit OT Treatments $Therapeutic Activity: 8-22 mins  [place], OTR/L   Alexis Goodell 08/20/2023, 2:34 PM

## 2023-08-20 NOTE — TOC Progression Note (Signed)
Transition of Care [facility]) - Progression Note    Patient Details  Name: Krystal [name] MRN: [id number] Date of Birth: 1940/11/28  [facility]) CM/SW Contact  Lorri Frederick, LCSW Phone Number: 08/20/2023, 12:15 PM  Clinical Narrative:  Pt SNF auth remains pending with [employer].      Expected Discharge Plan: Skilled Nursing Facility Barriers to Discharge: Continued Medical Work up, SNF Pending bed offer  Expected Discharge Plan and Services In-house Referral: Clinical Social Work   Post Acute Care Choice: Skilled Nursing Facility Living arrangements for the past 2 months: Single Family Home                                       Social Determinants of Health (SDOH) Interventions SDOH Screenings   Food Insecurity: No Food Insecurity (08/17/2023)  Housing: Low Risk  (08/17/2023)  Transportation Needs: No Transportation Needs (08/17/2023)  Utilities: Not At Risk (08/17/2023)  Alcohol Screen: Low Risk  (08/13/2022)  Depression (PHQ2-9): Low Risk  (03/16/2023)  Financial Resource Strain: Low Risk  (08/13/2022)  Physical Activity: Sufficiently Active (08/13/2022)  Social Connections: Socially Isolated (08/17/2023)  Stress: No Stress Concern Present (08/13/2022)  Tobacco Use: Medium Risk (08/17/2023)    Readmission Risk Interventions    08/17/2023    9:24 AM 03/21/2021   12:23 PM  Readmission Risk Prevention Plan  Transportation Screening Complete Complete  Home Care Screening Complete Complete  Medication Review (RN CM) Complete Complete

## 2023-08-20 NOTE — Progress Notes (Addendum)
Physical Therapy Treatment   Patient Details Name: Krystal [name] MRN: [id number] DOB: May 03, 1941 Today's Date: 08/20/2023   History of Present Illness   Pt is an 83 y/o female who presents from [facility] with open R supracondylar humerus fracture. She underwent I&D and ORIF on 08/17/2023. PMH significant for Asthma, brain tumor, breast CA, COPD, macular degeneration, osteoporosis, PNA, shingles, crani for excision of brain tumor, mastectomy.      PTA comments  Pt received in supine, agreeable to therapy session and with good participation and tolerance for transfer and gait training. Pt needs sling in place on RUE and dense cues to prevent weight bearing through RUE during transfers. Gait distance limited due to pt fatigue but pt with improved standing/gait tolerance compared with previous session and needing +1 assist at most, up to [facility] for stability during gait trial and transfers. Patient will benefit from continued follow up therapy, <3 hours/day post-acute in skilled facility.  Precautions  Precautions Fall  Restrictions  Weight Bearing Restrictions Per Provider Order Yes  RUE Weight Bearing Per Provider Order NWB  Pain Assessment  Pain Location RUE with PROM and repositioning during transfers  Pain Descriptors / Indicators Discomfort;Operative site guarding; ice applied over RUE once back in supine  Cognition  Arousal Alert  Behavior During Therapy [facility] for tasks assessed/performed  Overall Cognitive Status Within Functional Limits for tasks assessed  Difficult to assess due to Hard of hearing/deaf  Bed Mobility  Overal bed mobility Needs Assistance  Bed Mobility Supine to Sit;Sit to Supine  Supine to sit Min assist  Sit to supine Mod assist  General bed mobility comments assist for BLE over EOB and mod cues for avoiding using RUE when elevating trunk; sling kept in place for pt safety until back in supine  Transfers  Overall transfer level Needs assistance  Equipment used 1  person hand held assist  Transfers Sit to/from Stand;Bed to chair/wheelchair/BSC  Sit to Stand Contact guard assist;Min assist  General transfer comment from EOB<>HHA x2 reps, up to minA steadying assist  Ambulation/Gait  Ambulation/Gait assistance Min assist;Mod assist  Gait Distance (Feet) 25 Feet  Assistive device 1 person hand held assist  Gait Pattern/deviations Step-through pattern;Decreased stride length;Trunk flexed;Drifts right/left  General Gait Details HHA and gait belt support, light minA and up to modA when turning and cues for posture and activity pacing. VSS on RA  Gait velocity Decreased  Balance  Overall balance assessment Needs assistance  Sitting-balance support Feet supported;No upper extremity supported  Sitting balance-Leahy Scale Good  Standing balance support No upper extremity supported;During functional activity  Standing balance-Leahy Scale Fair  Standing balance comment better balance today, HHA  General Comments  General comments (skin integrity, edema, etc.) pt assisted to don sling prior to OOB as she is impulsive to try pushing with RUE when not cued constantly during transfers.  PT - End of Session  Equipment Utilized During Treatment Gait belt;Other (comment) (RUE sling)  Activity Tolerance Patient tolerated treatment well  Patient left in bed;with call bell/phone within reach;with bed alarm set;Other (comment) (bed in chair posture, pillows for RUE support/elevation; pt recently was up in chair)  Nurse Communication Mobility status;Other (comment);Weight bearing status (pt asking about getting her mucinex Rx from home, having more phlegm and states she takes it daily; PTA defer this request to medical team as it is not in scope of practice; PTA wrote NWB status and need to use sling when OOB on board)   PT - Assessment/Plan  PT Visit Diagnosis Unsteadiness on feet (R26.81);Pain  Pain - Right/Left Right  Pain - part of body Arm  PT Frequency (ACUTE  ONLY) Min 1X/week  Follow Up Recommendations Skilled nursing-short term rehab (<3 hours/day)  Can patient physically be transported by private vehicle  (may have difficulty with higher surface SUV but likely can get into sedan height vehicle with +2 assist)  Patient can return home with the following A lot of help with walking and/or transfers;A lot of help with bathing/dressing/bathroom;Assistance with cooking/housework;Assist for transportation;Help with stairs or ramp for entrance;Supervision due to cognitive status  PT equipment None recommended by PT (TBD)  AM-PAC PT "6 Clicks" Mobility Outcome Measure (Version 2)  Help needed turning from your back to your side while in a flat bed without using bedrails? 2  Help needed moving from lying on your back to sitting on the side of a flat bed without using bedrails? 2  Help needed moving to and from a bed to a chair (including a wheelchair)? 2  Help needed standing up from a chair using your arms (e.g., wheelchair or bedside chair)? 2  Help needed to walk in hospital room? 2  Help needed climbing 3-5 steps with a railing?  1  6 Click Score 11  Consider Recommendation of Discharge To: CIR/SNF/LTACH  Progressive Mobility  What is the highest level of mobility based on the progressive mobility assessment? Level 4 (Walks with assist in room) - Balance while marching in place and cannot step forward and back - Complete  Mobility Referral Yes (rec +2 safety and sling)  Activity Ambulated with assistance in room  PT Goal Progression  Progress towards PT goals Progressing toward goals  Acute Rehab PT Goals  PT Goal Formulation With patient  Time For Goal Achievement 09/01/23  PT Time Calculation  PT Start Time (ACUTE ONLY) 1435  PT Stop Time (ACUTE ONLY) 1458  PT Time Calculation (min) (ACUTE ONLY) 23 min  PT General Charges  $$ ACUTE PT VISIT 1 Visit  PT Treatments  $Gait Training 8-22 mins  $Therapeutic Activity 8-22 mins

## 2023-08-20 NOTE — Progress Notes (Signed)
Krystal [name]  WUJ:[id number] DOB: Aug 07, 1940 DOA: 08/16/2023 PCP: Dettinger, Elige Radon, MD    Brief Narrative:  83 year old with a history of HLD, chronic atrial fibrillation on Eliquis, COPD, and osteoporosis who suffered a simple mechanical fall and presented to the ER 1/10 at [facility].  She was initially diagnosed with a fracture of the right arm, placed in a sling, and discharged home.  After returning home family noticed significant bleeding from a wound on her right arm as well as difficulty using her right hand and therefore she presented to the ER at [facility] 1/12.  There a CT of the right upper extremity noted an open elbow fracture without dislocation, a markedly comminuted and dorsally and laterally displaced supracondylar distal humeral fracture, displaced and comminuted avulsion fracture along the olecranon, and an acute nondisplaced radial head and neck fracture.  Orthopedics recommended transfer of the patient to [facility] to be seen by the orthopedic trauma team.  The patient was able to be successfully transferred and 01/13 underwent surgical correction of her complex right humerus fracture.  Goals of Care:   Code Status: Full Code   DVT prophylaxis: SCDs Start: 08/17/23 2012 apixaban (ELIQUIS) tablet 5 mg   Interim Hx: No acute events recorded overnight.  Afebrile.  Vital signs stable.  Resting comfortably in the bedside chair.  No new complaints.  Is anxious to be transferred to a rehab facility to begin intensive rehab.  Assessment & Plan:  Open right supracondylar humeral fracture Status post I&D and ORIF 1/13 per Dr. Jena Gauss - postop care per orthopedic surgery as follows:  Weightbearing: NWB RUE ROM: Okay for gentle elbow motion.  Unrestricted shoulder motion Incisional and dressing care: Reinforce dressings as needed  Showering: Hold off on showering for now.  Okay for bed bath.  Orthopedic device(s): Sling as needed for comfort Pain management:  1. Tylenol  1000 mg 3 times daily  2. Robaxin 500 mg q 6 hours PRN 3. Oxycodone 5 mg q 4 hours PRN 4. Dilaudid 0.5 mg q 4 hours PRN VTE prophylaxis:  Restart home dose Eliquis , SCDs ID:  Ancef 2gm post op per open fracture protocol Impediments to Fracture Healing: Vitamin D 48.1 when last checked February 2023.  Is on home dose vitamin D3 5000 units daily at baseline Dispo: PT/OT evaluation today.  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, with symptoms acutely worsening with her recurrent injury  Mild acute blood loss anemia Expected blood loss given this clinical situation -monitoring hemoglobin trend with no indication for transfusion thus far  Mild hypokalemia Due to diminished intake - corrected with supplementation  Hyponatremia Due to simple volume depletion -resolved with volume expansion  Mild thrombocytopenia Likely due to consumption in setting of acute injury -resolved  Chronic atrial fibrillation Usual Eliquis has been resumed -Cardizem and metoprolol doses being titrated given relative hypotension in setting of pain medication use -continue to hold metoprolol for now given relative hypotension -monitor for reflexive tachycardia  HLD Continue usual statin  COPD Well compensated at present  Anxiety/depression Continue usual Zoloft dose   Family Communication: No family present at time of eval today Disposition: Medically stable for discharge to SNF for rehab stay  Objective: Blood pressure (!) 92/50, pulse 89, temperature 97.6 F (36.4 C), resp. rate 18, height 5\' 3"  (1.6 m), weight 73.5 kg, SpO2 95%.  Intake/Output Summary (Last 24 hours) at 08/20/2023 6213 Last data filed at 08/19/2023 1200 Gross per 24  hour  Intake 240 ml  Output --  Net 240 ml   Filed Weights   08/16/23 1758  Weight: 73.5 kg    Examination: General: No acute respiratory distress Lungs: Clear to auscultation bilaterally  Cardiovascular: Regular rate without murmur  Abdomen: Nontender, nondistended, soft, bowel sounds positive, no rebound Extremities: No significant edema bilateral lower extremities  CBC: Recent Labs  Lab 08/16/23 1934 08/17/23 0508 08/18/23 0522 08/19/23 0650 08/20/23 0529  WBC 6.3   < > 5.6 7.8 5.8  NEUTROABS 4.0  --   --  6.0  --   HGB 10.8*   < > 10.1* 8.5* 8.0*  HCT 33.4*   < > 31.4* 25.8* 25.2*  MCV 91.0   < > 92.1 89.6 91.3  PLT 133*   < > 149* 190 193   < > = values in this interval not displayed.   Basic Metabolic Panel: Recent Labs  Lab 08/17/23 0508 08/18/23 0522 08/19/23 0650 08/20/23 0529  NA 130* 138 141 139  K 3.4* 4.2 4.1 4.0  CL 98 104 106 103  CO2 24 21* 30 30  GLUCOSE 101* 158* 176* 141*  BUN 16 18 30* 26*  CREATININE 0.56 0.85 0.64 0.62  CALCIUM 8.2* 8.4* 8.4* 8.4*  MG 1.9 1.8  --  2.1  PHOS 3.0  --   --   --    GFR: Estimated Creatinine Clearance: 50.2 mL/min (by C-G formula based on SCr of 0.62 mg/dL).   Scheduled Meds:  acetaminophen  1,000 mg Oral TID   apixaban  5 mg Oral BID   diltiazem  180 mg Oral Daily   docusate sodium  100 mg Oral BID   famotidine  20 mg Oral QHS   feeding supplement  237 mL Oral BID BM   montelukast  10 mg Oral QHS   pantoprazole  80 mg Oral Q1200   rosuvastatin  20 mg Oral Daily     LOS: 4 days   Lonia Blood, MD [facility] Office  [phone] Pager - Text Page per Loretha Stapler  If 7PM-7AM, please contact night-coverage per Amion 08/20/2023, 9:22 AM

## 2023-08-21 DIAGNOSIS — H3530 Unspecified macular degeneration: Secondary | ICD-10-CM | POA: Diagnosis not present

## 2023-08-21 DIAGNOSIS — S42401B Unspecified fracture of lower end of right humerus, initial encounter for open fracture: Secondary | ICD-10-CM | POA: Diagnosis not present

## 2023-08-21 DIAGNOSIS — M810 Age-related osteoporosis without current pathological fracture: Secondary | ICD-10-CM | POA: Diagnosis not present

## 2023-08-21 DIAGNOSIS — Z7901 Long term (current) use of anticoagulants: Secondary | ICD-10-CM | POA: Diagnosis not present

## 2023-08-21 DIAGNOSIS — G5630 Lesion of radial nerve, unspecified upper limb: Secondary | ICD-10-CM | POA: Diagnosis not present

## 2023-08-21 DIAGNOSIS — I2510 Atherosclerotic heart disease of native coronary artery without angina pectoris: Secondary | ICD-10-CM | POA: Diagnosis not present

## 2023-08-21 DIAGNOSIS — G8929 Other chronic pain: Secondary | ICD-10-CM | POA: Diagnosis not present

## 2023-08-21 DIAGNOSIS — R531 Weakness: Secondary | ICD-10-CM | POA: Diagnosis not present

## 2023-08-21 DIAGNOSIS — S42411B Displaced simple supracondylar fracture without intercondylar fracture of right humerus, initial encounter for open fracture: Secondary | ICD-10-CM | POA: Diagnosis not present

## 2023-08-21 DIAGNOSIS — I959 Hypotension, unspecified: Secondary | ICD-10-CM | POA: Diagnosis not present

## 2023-08-21 DIAGNOSIS — E871 Hypo-osmolality and hyponatremia: Secondary | ICD-10-CM | POA: Diagnosis not present

## 2023-08-21 DIAGNOSIS — D696 Thrombocytopenia, unspecified: Secondary | ICD-10-CM | POA: Diagnosis not present

## 2023-08-21 DIAGNOSIS — H548 Legal blindness, as defined in USA: Secondary | ICD-10-CM | POA: Diagnosis not present

## 2023-08-21 DIAGNOSIS — D62 Acute posthemorrhagic anemia: Secondary | ICD-10-CM | POA: Diagnosis not present

## 2023-08-21 DIAGNOSIS — E782 Mixed hyperlipidemia: Secondary | ICD-10-CM | POA: Diagnosis not present

## 2023-08-21 DIAGNOSIS — S42423D Displaced comminuted supracondylar fracture without intercondylar fracture of unspecified humerus, subsequent encounter for fracture with routine healing: Secondary | ICD-10-CM | POA: Diagnosis not present

## 2023-08-21 DIAGNOSIS — I482 Chronic atrial fibrillation, unspecified: Secondary | ICD-10-CM | POA: Diagnosis not present

## 2023-08-21 DIAGNOSIS — F339 Major depressive disorder, recurrent, unspecified: Secondary | ICD-10-CM | POA: Diagnosis not present

## 2023-08-21 DIAGNOSIS — E876 Hypokalemia: Secondary | ICD-10-CM | POA: Diagnosis not present

## 2023-08-21 DIAGNOSIS — E8809 Other disorders of plasma-protein metabolism, not elsewhere classified: Secondary | ICD-10-CM | POA: Diagnosis not present

## 2023-08-21 DIAGNOSIS — M545 Low back pain, unspecified: Secondary | ICD-10-CM | POA: Diagnosis not present

## 2023-08-21 DIAGNOSIS — J449 Chronic obstructive pulmonary disease, unspecified: Secondary | ICD-10-CM | POA: Diagnosis not present

## 2023-08-21 DIAGNOSIS — S42411D Displaced simple supracondylar fracture without intercondylar fracture of right humerus, subsequent encounter for fracture with routine healing: Secondary | ICD-10-CM | POA: Diagnosis not present

## 2023-08-21 DIAGNOSIS — R2689 Other abnormalities of gait and mobility: Secondary | ICD-10-CM | POA: Diagnosis not present

## 2023-08-21 DIAGNOSIS — F419 Anxiety disorder, unspecified: Secondary | ICD-10-CM | POA: Diagnosis not present

## 2023-08-21 DIAGNOSIS — M62562 Muscle wasting and atrophy, not elsewhere classified, left lower leg: Secondary | ICD-10-CM | POA: Diagnosis not present

## 2023-08-21 DIAGNOSIS — I10 Essential (primary) hypertension: Secondary | ICD-10-CM | POA: Diagnosis not present

## 2023-08-21 DIAGNOSIS — K219 Gastro-esophageal reflux disease without esophagitis: Secondary | ICD-10-CM | POA: Diagnosis not present

## 2023-08-21 DIAGNOSIS — Z743 Need for continuous supervision: Secondary | ICD-10-CM | POA: Diagnosis not present

## 2023-08-21 DIAGNOSIS — S42423A Displaced comminuted supracondylar fracture without intercondylar fracture of unspecified humerus, initial encounter for closed fracture: Secondary | ICD-10-CM | POA: Diagnosis not present

## 2023-08-21 DIAGNOSIS — Z9181 History of falling: Secondary | ICD-10-CM | POA: Diagnosis not present

## 2023-08-21 DIAGNOSIS — M62561 Muscle wasting and atrophy, not elsewhere classified, right lower leg: Secondary | ICD-10-CM | POA: Diagnosis not present

## 2023-08-21 LAB — CBC
HCT: 28.7 % — ABNORMAL LOW (ref 36.0–46.0)
Hemoglobin: 9.1 g/dL — ABNORMAL LOW (ref 12.0–15.0)
MCH: 29 pg (ref 26.0–34.0)
MCHC: 31.7 g/dL (ref 30.0–36.0)
MCV: 91.4 fL (ref 80.0–100.0)
Platelets: 223 10*3/uL (ref 150–400)
RBC: 3.14 MIL/uL — ABNORMAL LOW (ref 3.87–5.11)
RDW: 15.1 % (ref 11.5–15.5)
WBC: 6.2 10*3/uL (ref 4.0–10.5)
nRBC: 0.3 % — ABNORMAL HIGH (ref 0.0–0.2)

## 2023-08-21 MED ORDER — METOPROLOL TARTRATE 12.5 MG HALF TABLET
12.5000 mg | ORAL_TABLET | Freq: Two times a day (BID) | ORAL | Status: DC
  Administered 2023-08-21: 12.5 mg via ORAL
  Filled 2023-08-21: qty 1

## 2023-08-21 MED ORDER — DILTIAZEM HCL ER COATED BEADS 180 MG PO CP24: 180.0000 mg | ORAL_CAPSULE | Freq: Every day | ORAL | Status: DC

## 2023-08-21 MED ORDER — POLYETHYLENE GLYCOL 3350 17 G PO PACK: 17.0000 g | PACK | Freq: Every day | ORAL | Status: AC | PRN

## 2023-08-21 MED ORDER — DOCUSATE SODIUM 100 MG PO CAPS: 100.0000 mg | ORAL_CAPSULE | Freq: Two times a day (BID) | ORAL | Status: AC

## 2023-08-21 MED ORDER — SODIUM CHLORIDE 0.9 % IV BOLUS
250.0000 mL | Freq: Once | INTRAVENOUS | Status: AC
  Administered 2023-08-21: 250 mL via INTRAVENOUS

## 2023-08-21 MED ORDER — METOPROLOL TARTRATE 25 MG PO TABS: 12.5000 mg | ORAL_TABLET | Freq: Two times a day (BID) | ORAL | Status: DC

## 2023-08-21 NOTE — Plan of Care (Signed)

## 2023-08-21 NOTE — Discharge Summary (Signed)
DISCHARGE SUMMARY  Krystal [name]  MR#: [id number]  DOB:[birth year]  Date of Admission: 08/16/2023 Date of Discharge: 08/21/2023  Attending Physician:Gaje Tennyson Silvestre Gunner, MD  Patient's NWG:[id number], Elige Radon, MD  Disposition: D/C to SNF for rehab stay   Follow-up Appts:  Follow-up Information     Haddix, Gillie Manners, MD. Schedule an appointment as soon as possible for a visit in 2 week(s).   Specialty: Orthopedic Surgery Why: for wound check and repeat x-rays Contact information: [address] [place] [address] [phone]                 Tests Needing Follow-up: -follow BP and HR as Cardizem CD and Lopressor doses may need adjusting as discussed below   Discharge Diagnoses: Open right supracondylar humeral fracture R Radial nerve palsy Mild acute blood loss anemia Mild hypokalemia Hyponatremia Mild thrombocytopenia Chronic atrial fibrillation HLD COPD Anxiety/depression  Initial presentation: 83 year old with a history of HLD, chronic atrial fibrillation on Eliquis, COPD, and osteoporosis who suffered a simple mechanical fall and presented to the ER 1/10 at [facility]. She was initially diagnosed with a fracture of the right arm, placed in a sling, and discharged home. After returning home family noticed significant bleeding from a wound on her right arm as well as difficulty using her right hand and therefore she presented to the ER at [facility] 1/12. There a CT of the right upper extremity noted an open elbow fracture without dislocation, a markedly comminuted and dorsally and laterally displaced supracondylar distal humeral fracture, displaced and comminuted avulsion fracture along the olecranon, and an acute nondisplaced radial head and neck fracture. Orthopedics recommended transfer of the patient to [facility] to be seen by the Orthopedic Trauma Team. The patient was able to be successfully transferred and 01/13 underwent surgical correction of her  complex right humerus fracture.   Hospital Course:  Open right supracondylar humeral fracture Status post I&D and ORIF 1/13 per Dr. Jena Gauss - postop care per Orthopedic Surgery as follows:   Weightbearing: NWB RUE ROM: Okay for gentle elbow motion.  Unrestricted shoulder motion Incisional and dressing care: Reinforce dressings as needed  Showering: Hold off on showering for now.  Okay for bed bath.  Orthopedic device(s): Sling as needed for comfort VTE prophylaxis:  Restarted home dose Eliquis , SCDs Impediments to Fracture Healing: Vitamin D 48.1 when last checked February 2023.  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, with symptoms acutely worsening with her recurrent injury- no further acute intervention indicated    Mild acute blood loss anemia Expected blood loss given this clinical situation -monitored hemoglobin trend with no indication for transfusion during this admission - Hgb stable at time of d/c    Mild hypokalemia Due to diminished intake - corrected with supplementation   Hyponatremia Due to simple volume depletion - resolved with volume expansion   Mild thrombocytopenia Likely due to consumption in setting of acute injury - resolved   Chronic atrial fibrillation Usual Eliquis has been resumed - Cardizem and metoprolol doses decreased given relative hypotension in setting of pain medication use - monitor BP and HR trend and increase as needed to usual home doses of Cardizem CD 300 every day and metoprolol 25mg  BID respectively    HLD Continue  usual statin   COPD Well compensated at present   Anxiety/depression Continue usual Zoloft dose    Allergies as of 08/21/2023       Reactions   Actonel [risedronate] Nausea And Vomiting, Other (See Comments)    Throat tightness   Fosamax [alendronate Sodium] Other (See Comments)   esophagitis   Mevacor [lovastatin] Other (See Comments)   myalgias   Miacalcin [calcitonin (salmon)] Nausea And Vomiting   Mobic [meloxicam] Hives, Itching   rash        Medication List     STOP taking these medications    furosemide 20 MG tablet Commonly known as: LASIX   nitroGLYCERIN 0.4 MG/SPRAY spray Commonly known as: NITROLINGUAL   oxyCODONE-acetaminophen 5-325 MG tablet Commonly known as: PERCOCET/ROXICET   potassium chloride SA 20 MEQ tablet Commonly known as: Klor-Con M20       TAKE these medications    acetaminophen 500 MG tablet Commonly known as: TYLENOL Take 2 tablets (1,000 mg total) by mouth every 6 (six) hours as needed for mild pain (pain score 1-3) or moderate pain (pain score 4-6).   Calcium Citrate-Vitamin D 315-250 MG-UNIT Tabs Commonly known as: Calcium Citrate + D Take 2 tablets by mouth daily.   diltiazem 180 MG 24 hr capsule Commonly known as: CARDIZEM CD Take 1 capsule (180 mg total) by mouth daily. Start taking on: August 22, 2023   docusate sodium 100 MG capsule Commonly known as: COLACE Take 1 capsule (100 mg total) by mouth 2 (two) times daily.   Eliquis 5 MG Tabs tablet Generic drug: apixaban TAKE 1 TABLET BY MOUTH TWICE A DAY   esomeprazole 40 MG capsule Commonly known as: NEXIUM Take 1 capsule (40 mg total) by mouth daily.   famotidine 20 MG tablet Commonly known as: PEPCID TAKE ONE TABLET DAILY AFTER SUPPER   ferrous sulfate 325 (65 FE) MG tablet Take 1 tablet (325 mg total) by mouth at bedtime.   ipratropium-albuterol 0.5-2.5 (3) MG/3ML Soln Commonly known as: DUONEB Take 3 mLs by nebulization every 6 (six) hours as needed. Dx:  Asthma 493.90   linaclotide 72 MCG capsule Commonly known as: Linzess Take 1 capsule (72 mcg total) by mouth daily before breakfast.   methocarbamol 500 MG tablet Commonly known as: ROBAXIN Take 1 tablet (500  mg total) by mouth every 8 (eight) hours as needed for muscle spasms.   metoprolol tartrate 25 MG tablet Commonly known as: LOPRESSOR Take 0.5 tablets (12.5 mg total) by mouth 2 (two) times daily. What changed: how much to take   montelukast 10 MG tablet Commonly known as: SINGULAIR TAKE 1 TABLET BY MOUTH EVERYDAY AT BEDTIME   oxyCODONE 5 MG immediate release tablet Commonly known as: Oxy IR/ROXICODONE Take 1 tablet (5 mg total) by mouth every 6 (six) hours as needed for severe pain (pain score 7-10).   polyethylene glycol 17 g packet Commonly known as: MIRALAX / GLYCOLAX Take 17 g by mouth daily as needed for mild constipation.   PRESERVISION/LUTEIN PO Take 1 capsule by mouth at bedtime.   rosuvastatin 20 MG tablet Commonly known as: CRESTOR TAKE 1 TABLET BY MOUTH EVERY DAY   sertraline 50 MG tablet Commonly known as: ZOLOFT Take 1 tablet (50 mg total) by mouth daily.   Vitamin D3 125 MCG (5000 UT) Caps Take 5,000 Units by mouth daily.        Day of Discharge BP (!) 105/58 (BP Location: Left Arm)   Pulse (!) 111   Temp (!)  97.5 F (36.4 C) (Oral)   Resp 17   Ht 5\' 3"  (1.6 m)   Wt 73.5 kg   SpO2 96%   BMI 28.70 kg/m   Physical Exam: General: No acute respiratory distress Lungs: Clear to auscultation bilaterally without wheezes or crackles Cardiovascular: Regular rate and rhythm without murmur gallop or rub normal S1 and S2 Abdomen: Nontender, nondistended, soft, bowel sounds positive, no rebound, no ascites, no appreciable mass Extremities: No significant cyanosis, clubbing, or edema bilateral lower extremities  Basic Metabolic Panel: Recent Labs  Lab 08/16/23 1934 08/17/23 0508 08/18/23 0522 08/19/23 0650 08/20/23 0529  NA 134* 130* 138 141 139  K 3.5 3.4* 4.2 4.1 4.0  CL 101 98 104 106 103  CO2 28 24 21* 30 30  GLUCOSE 126* 101* 158* 176* 141*  BUN 19 16 18  30* 26*  CREATININE 0.68 0.56 0.85 0.64 0.62  CALCIUM 8.2* 8.2* 8.4* 8.4* 8.4*  MG  --   1.9 1.8  --  2.1  PHOS  --  3.0  --   --   --     CBC: Recent Labs  Lab 08/16/23 1934 08/17/23 0508 08/18/23 0522 08/19/23 0650 08/20/23 0529 08/21/23 0934  WBC 6.3 5.3 5.6 7.8 5.8 6.2  NEUTROABS 4.0  --   --  6.0  --   --   HGB 10.8* 10.3* 10.1* 8.5* 8.0* 9.1*  HCT 33.4* 31.5* 31.4* 25.8* 25.2* 28.7*  MCV 91.0 91.0 92.1 89.6 91.3 91.4  PLT 133* 135* 149* 190 193 223    Time spent in discharge (includes decision making & examination of pt): 35 minutes  08/21/2023, 11:39 AM   Lonia Blood, MD [facility] Office  [phone]

## 2023-08-21 NOTE — TOC Transition Note (Signed)
Transition of Care [facility]) - Discharge Note   Patient Details  Name: Krystal [name] MRN: [id number] Date of Birth: [birth year]  Transition of Care [facility]) CM/SW Contact:  Lorri Frederick, LCSW Phone Number: 08/21/2023, 12:09 PM   Clinical Narrative:   Pt discharging to [facility].  RN call report to [phone].      Final next level of care: Skilled Nursing Facility Barriers to Discharge: Barriers Resolved   Patient Goals and CMS Choice     Choice offered to / list presented to : Patient (requesting [facility])      Discharge Placement              Patient chooses bed at:  [facility]) Patient to be transferred to facility by: [facility] Name of family member notified: son Bethann Berkshire and DIL Selena Patient and family notified of of transfer: 08/21/23  Discharge Plan and Services Additional resources added to the After Visit Summary for   In-house Referral: Clinical Social Work   Post Acute Care Choice: Skilled Nursing Facility                               Social Drivers of Health (SDOH) Interventions SDOH Screenings   Food Insecurity: No Food Insecurity (08/17/2023)  Housing: Low Risk  (08/17/2023)  Transportation Needs: No Transportation Needs (08/17/2023)  Utilities: Not At Risk (08/17/2023)  Alcohol Screen: Low Risk  (08/13/2022)  Depression (PHQ2-9): Low Risk  (03/16/2023)  Financial Resource Strain: Low Risk  (08/13/2022)  Physical Activity: Sufficiently Active (08/13/2022)  Social Connections: Socially Isolated (08/17/2023)  Stress: No Stress Concern Present (08/13/2022)  Tobacco Use: Medium Risk (08/17/2023)     Readmission Risk Interventions    08/17/2023    9:24 AM 03/21/2021   12:23 PM  Readmission Risk Prevention Plan  Transportation Screening Complete Complete  Home Care Screening Complete Complete  Medication Review (RN CM) Complete Complete

## 2023-08-21 NOTE — Progress Notes (Signed)
Occupational Therapy Treatment Patient Details Name: Krystal [name] MRN: [id number] DOB: 1940/10/14 Today's Date: 08/21/2023   History of present illness Pt is an 83 y/o female who presents from [facility] with open R supracondylar humerus fracture. She underwent I&D and ORIF on 08/17/2023. PMH significant for Asthma, brain tumor, breast CA, COPD, macular degeneration, osteoporosis, PNA, shingles, crani for excision of brain tumor, mastectomy.   OT comments  Pt progressing towards OT goals this session. Focused on R wrist cock up splint check and establishing splint wearing schedule. Written on bright pink paper and posted in room; with instructions to take with her to post-acute rehab. Educated on elevation, exercises and ice for edema management. (And written on splint wearing schedule poster as well) OT will continue to follow acutely - but currently Pt with dc orders for later today.       If plan is discharge home, recommend the following:  A lot of help with walking and/or transfers;A little help with bathing/dressing/bathroom;Assistance with cooking/housework;Assist for transportation;Help with stairs or ramp for entrance   Equipment Recommendations  Other (comment) (defer to next venue of care)    Recommendations for Other Services      Precautions / Restrictions Precautions Precautions: Fall Precaution Comments: 2 hours on, 2 hours off during the day. ON for nighttime. Elevate with 2 pillows and ice Required Braces or Orthoses: Splint/Cast Splint/Cast: R wrist cock up Splint/Cast - Date Prophylactic Dressing Applied (if applicable): 08/17/23 Restrictions Weight Bearing Restrictions Per Provider Order: Yes RUE Weight Bearing Per Provider Order: Non weight bearing       Mobility Bed Mobility               General bed mobility comments: OOB in recliner at beginning and end of session    Transfers Overall transfer level: Needs assistance Equipment used: 1 person hand  held assist Transfers: Sit to/from Stand             General transfer comment: repositioning in chair, squat     Balance Overall balance assessment: Needs assistance Sitting-balance support: Feet supported, No upper extremity supported Sitting balance-Leahy Scale: Good                                     ADL either performed or assessed with clinical judgement   ADL                                              Extremity/Trunk Assessment Upper Extremity Assessment Upper Extremity Assessment: Left hand dominant;RUE deficits/detail RUE Deficits / Details: swelling doing better, fair grip strength, continues to display wrist drop and no AROM with elbow flexoin, radial nerve palsy previosly notated in documents from admission. no muscle twitch present at wrist extensors or biceps. RUE Sensation: decreased light touch RUE Coordination: decreased gross motor;decreased fine motor            Vision       Perception     Praxis      Cognition Arousal: Alert Behavior During Therapy: WFL for tasks assessed/performed Overall Cognitive Status: Within Functional Limits for tasks assessed  Exercises      Shoulder Instructions       General Comments focus of session was splint check and establishing wearing schedule    Pertinent Vitals/ Pain       Pain Assessment Pain Assessment: 0-10 Pain Score: 2  Pain Location: RUE Pain Descriptors / Indicators: Discomfort, Operative site guarding Pain Intervention(s): Monitored during session, Repositioned, Ice applied  Home Living                                          Prior Functioning/Environment              Frequency  Min 1X/week        Progress Toward Goals  OT Goals(current goals can now be found in the care plan section)  Progress towards OT goals: Progressing toward goals  Acute Rehab OT  Goals Patient Stated Goal: pain management of RUE OT Goal Formulation: With patient Time For Goal Achievement: 09/01/23 Potential to Achieve Goals: Good  Plan      Co-evaluation                 AM-PAC OT "6 Clicks" Daily Activity     Outcome Measure   Help from another person eating meals?: A Little Help from another person taking care of personal grooming?: A Little Help from another person toileting, which includes using toliet, bedpan, or urinal?: A Little Help from another person bathing (including washing, rinsing, drying)?: A Lot Help from another person to put on and taking off regular upper body clothing?: A Little Help from another person to put on and taking off regular lower body clothing?: A Lot 6 Click Score: 16    End of Session Equipment Utilized During Treatment: Gait belt;Other (comment) (R wrist cock up splint)  OT Visit Diagnosis: Unsteadiness on feet (R26.81);Other abnormalities of gait and mobility (R26.89);Repeated falls (R29.6);Muscle weakness (generalized) (M62.81);History of falling (Z91.81);Pain Pain - Right/Left: Right Pain - part of body: Arm;Hand   Activity Tolerance Patient tolerated treatment well   Patient Left in chair;with call bell/phone within reach;with chair alarm set   Nurse Communication Mobility status        Time: 4098-1191 OT Time Calculation (min): 18 min  Charges: OT General Charges $OT Visit: 1 Visit OT Treatments $Self Care/Home Management : 8-22 mins  Nyoka Cowden OTR/L Acute Rehabilitation Services Office: [phone]  [facility] 08/21/2023, 12:44 PM

## 2023-08-21 NOTE — TOC Progression Note (Addendum)
Transition of Care [facility]) - Progression Note    Patient Details  Name: Krystal [name] MRN: [id number] Date of Birth: 04-25-1941  [facility] CM/SW Contact  Lorri Frederick, LCSW Phone Number: 08/21/2023, 11:25 AM  Clinical Narrative:   Drucie Opitz approved: 1/17 - 4/09 [id number]#[id number].   CSW confirmed with [facility] that they can receive pt today.  MD notified.    Expected Discharge Plan: Skilled Nursing Facility Barriers to Discharge: Continued Medical Work up, SNF Pending bed offer  Expected Discharge Plan and Services In-house Referral: Clinical Social Work   Post Acute Care Choice: Skilled Nursing Facility Living arrangements for the past 2 months: Single Family Home                                       Social Determinants of Health (SDOH) Interventions SDOH Screenings   Food Insecurity: No Food Insecurity (08/17/2023)  Housing: Low Risk  (08/17/2023)  Transportation Needs: No Transportation Needs (08/17/2023)  Utilities: Not At Risk (08/17/2023)  Alcohol Screen: Low Risk  (08/13/2022)  Depression (PHQ2-9): Low Risk  (03/16/2023)  Financial Resource Strain: Low Risk  (08/13/2022)  Physical Activity: Sufficiently Active (08/13/2022)  Social Connections: Socially Isolated (08/17/2023)  Stress: No Stress Concern Present (08/13/2022)  Tobacco Use: Medium Risk (08/17/2023)    Readmission Risk Interventions    08/17/2023    9:24 AM 03/21/2021   12:23 PM  Readmission Risk Prevention Plan  Transportation Screening Complete Complete  Home Care Screening Complete Complete  Medication Review (RN CM) Complete Complete

## 2023-08-21 NOTE — Progress Notes (Signed)
Report called to Charity fundraiser at [facility].

## 2023-08-23 DIAGNOSIS — J449 Chronic obstructive pulmonary disease, unspecified: Secondary | ICD-10-CM | POA: Diagnosis not present

## 2023-08-23 DIAGNOSIS — S42423A Displaced comminuted supracondylar fracture without intercondylar fracture of unspecified humerus, initial encounter for closed fracture: Secondary | ICD-10-CM | POA: Diagnosis not present

## 2023-08-23 DIAGNOSIS — E871 Hypo-osmolality and hyponatremia: Secondary | ICD-10-CM | POA: Diagnosis not present

## 2023-08-23 DIAGNOSIS — I10 Essential (primary) hypertension: Secondary | ICD-10-CM | POA: Diagnosis not present

## 2023-08-23 DIAGNOSIS — E876 Hypokalemia: Secondary | ICD-10-CM | POA: Diagnosis not present

## 2023-08-23 DIAGNOSIS — R531 Weakness: Secondary | ICD-10-CM | POA: Diagnosis not present

## 2023-08-24 ENCOUNTER — Encounter (HOSPITAL_COMMUNITY): Payer: Self-pay | Admitting: Student

## 2023-08-28 DIAGNOSIS — E782 Mixed hyperlipidemia: Secondary | ICD-10-CM | POA: Diagnosis not present

## 2023-09-07 DIAGNOSIS — S42411D Displaced simple supracondylar fracture without intercondylar fracture of right humerus, subsequent encounter for fracture with routine healing: Secondary | ICD-10-CM | POA: Diagnosis not present

## 2023-09-10 ENCOUNTER — Telehealth: Payer: Self-pay | Admitting: Family Medicine

## 2023-09-10 NOTE — Telephone Encounter (Signed)
 Appt scheduled for 2/12 at 3:55. Daughter in law made aware.

## 2023-09-10 NOTE — Telephone Encounter (Signed)
 Can patient be scheduled to see Dr Maryanne on his DOD day on 2/14 for Rehab follow up, for 30 min?  Copied from CRM [id number]. Topic: Appointments - Scheduling Inquiry for Clinic >> Sep 10, 2023  2:36 PM Deleta HERO wrote: Reason for CRM: Pt will be leaving rehab tomorrow afternoon, 02/07, and she will need to schedule a follow-up visit with Dr. Maryanne, I reviewed his openings in BookIt, it shows the soonest I could schedule her is 03/06, which is further out. Can she be scheduled sooner, preferably by next week in the afternoon? Callback #:[phone]

## 2023-09-11 DIAGNOSIS — Z9181 History of falling: Secondary | ICD-10-CM | POA: Diagnosis not present

## 2023-09-11 DIAGNOSIS — R531 Weakness: Secondary | ICD-10-CM | POA: Diagnosis not present

## 2023-09-11 DIAGNOSIS — E876 Hypokalemia: Secondary | ICD-10-CM | POA: Diagnosis not present

## 2023-09-11 DIAGNOSIS — E871 Hypo-osmolality and hyponatremia: Secondary | ICD-10-CM | POA: Diagnosis not present

## 2023-09-11 DIAGNOSIS — G5630 Lesion of radial nerve, unspecified upper limb: Secondary | ICD-10-CM | POA: Diagnosis not present

## 2023-09-11 DIAGNOSIS — S42423D Displaced comminuted supracondylar fracture without intercondylar fracture of unspecified humerus, subsequent encounter for fracture with routine healing: Secondary | ICD-10-CM | POA: Diagnosis not present

## 2023-09-11 DIAGNOSIS — J449 Chronic obstructive pulmonary disease, unspecified: Secondary | ICD-10-CM | POA: Diagnosis not present

## 2023-09-11 DIAGNOSIS — I10 Essential (primary) hypertension: Secondary | ICD-10-CM | POA: Diagnosis not present

## 2023-09-11 DIAGNOSIS — S42411B Displaced simple supracondylar fracture without intercondylar fracture of right humerus, initial encounter for open fracture: Secondary | ICD-10-CM | POA: Diagnosis not present

## 2023-09-11 NOTE — Discharge Summary (Signed)
 [facility] Discharge Summary        Admit date:    08/21/2023 Discharge date:   09/11/2023 Length of stay:    LOS: 21 days     Discharge Service:   [facility] Discharge Attending Physician: Yancey Reno, MD Discharge to:    To Home with Home Health Condition at Discharge:  good Code Status:    Full Code  Patient Care Team: [name] as PCP - General  Summary  Consults       none Intensivist  Discharge Diagnoses  Principal Problem:   Fracture of humerus, supracondylar, open, right, initial encounter Active Problems:   Need for COVID-19 vaccine   [facility]   At [facility] rehab in [place]  patient underwent intensive occupational therapy.  Physical therapy.  And speech therapy with a good response She gained confidence of doing activities of daily living and she was scheduled as an outpatient to have physical therapy and Occupational Therapy in addition she was scheduled to see her primary care doctor in 2 weeks  I spent 30 mins in the discharge of this patient.  Procedures   none  Discharge Medications     Your Medication List     PAUSE taking these medications    busPIRone  5 MG tablet Wait to take this until: September 12, 2023 Commonly known as: BUSPAR  Take 1 tablet (5 mg total) by mouth.   diclofenac  sodium 1 % gel Wait to take this until: September 12, 2023 Commonly known as: VOLTAREN  Apply 2 g topically.   furosemide  20 MG tablet Wait to take this until: September 12, 2023 Commonly known as: LASIX  Take 0.5 tablets (10 mg total) by mouth daily.   nitroglycerin  0.4 mg/dose spray Wait to take this until: September 12, 2023 Commonly known as: NITROLINGUAL  ONE SPRAY UNDER TONGUE AS NEED FOR CHESTPAIN. MAY REPEAT IN 5 MINUTES IF PAIN NOT RELIEVED CALL 911.   potassium chloride  20 MEQ ER tablet Wait to take this until: September 12, 2023 Take 1 tablet (20 mEq total) by mouth daily.   rosuvastatin  20 MG tablet Wait to take this  until: September 12, 2023 Commonly known as: CRESTOR  Take 1 tablet (20 mg total) by mouth daily.       STOP taking these medications    amoxicillin  875 MG tablet Commonly known as: AMOXIL    azithromycin  250 MG tablet Commonly known as: ZITHROMAX  Z-PAK   esomeprazole  40 MG capsule Commonly known as: NEXIUM    hydrOXYzine  25 MG tablet Commonly known as: ATARAX    oxyCODONE -acetaminophen  5-325 mg per tablet Commonly known as: PERCOCET   predniSONE  5 MG tablet Commonly known as: DELTASONE    PROLIA  60 mg/mL Syrg Generic drug: denosumab    RESTASIS  0.05 % ophthalmic emulsion Generic drug: cycloSPORINE        START taking these medications    docusate sodium  100 MG capsule Commonly known as: COLACE Take 1 capsule (100 mg total) by mouth two (2) times a day.   famotidine  20 MG tablet Commonly known as: PEPCID  Take 1 tablet (20 mg total) by mouth daily.   ipratropium-albuterol  0.5-2.5 mg/3 mL nebulizer Commonly known as: DUO-NEB Inhale 3 mL by nebulization every six (6) hours as needed (asthma).   linaclotide  72 mcg capsule Commonly known as: LINZESS  Take 1 capsule (72 mcg total) by mouth daily before breakfast. Start taking on: September 12, 2023   methocarbamol  500 MG tablet Commonly known as: ROBAXIN  Take 1 tablet (500 mg total) by mouth every eight (  8) hours as needed (as needed muscle spasm).   sertraline  50 MG tablet Commonly known as: ZOLOFT  Take 1 tablet (50 mg total) by mouth daily. Start taking on: September 12, 2023   vitamins A,C,E-zinc-copper  2,148 mcg-113 mg-45 mg-17.4mg  tablet Commonly known as: OCUVITE PRESERVISION Take 1 tablet by mouth daily.       CHANGE how you take these medications    cholecalciferol  (vitamin D3-125 mcg (5,000 unit)) 125 mcg (5,000 unit) capsule Take 1 capsule (125 mcg total) by mouth daily. Start taking on: September 12, 2023 What changed: when to take this   dilTIAZem  180 MG 24 hr capsule Commonly known as: CARDIZEM   CD Take 1 capsule (180 mg total) by mouth daily. Start taking on: September 12, 2023 What changed:  medication strength how much to take   ferrous sulfate  325 (65 FE) MG tablet Take 1 tablet (325 mg total) by mouth every other day. Start taking on: September 12, 2023 What changed: when to take this   metoPROLOL  tartrate 25 MG tablet Commonly known as: Lopressor  Take 0.5 tablets (12.5 mg total) by mouth two (2) times a day. What changed: how much to take   montelukast  10 mg tablet Commonly known as: SINGULAIR  Take 1 tablet (10 mg total) by mouth nightly. What changed: how to take this       CONTINUE taking these medications    apixaban  5 mg Tab Commonly known as: ELIQUIS  Take 1 tablet (5 mg total) by mouth two (2) times a day.        Pending Test Results   none   Lab Results   Results for orders placed or performed during the hospital encounter of 08/21/23  Comprehensive metabolic panel  Result Value Ref Range   Sodium 142 135 - 145 mmol/L   Potassium 3.8 3.5 - 5.0 mmol/L   Chloride 107 98 - 107 mmol/L   CO2 29.9 21.0 - 32.0 mmol/L   Anion Gap 5 3 - 11 mmol/L   BUN 21 (H) 8 - 20 mg/dL   Creatinine 9.30 9.39 - 1.10 mg/dL   BUN/Creatinine Ratio 30    eGFR CKD-EPI (2021) Female 86 >=60 mL/min/1.33m2   Glucose 113 70 - 179 mg/dL   Calcium  8.9 8.5 - 10.1 mg/dL   Albumin 2.5 (L) 3.5 - 5.0 g/dL   Total Protein 5.4 (L) 6.0 - 8.0 g/dL   Total Bilirubin 0.6 0.3 - 1.2 mg/dL   AST 21 15 - 40 U/L   ALT 25 12 - 78 U/L   Alkaline Phosphatase 88 46 - 116 U/L    Imaging   No results found.  Discharge Instructions   Diet Instructions   Regular Ensure Plus twice a day      Follow Up instructions and Outpatient Referrals    Ambulatory Referral to Home Health     Reason for referral: Home Health Post Rehab   Special instructions: PCP is DR.Dettinger   Requested follow up plan: I would resume responsibility.   Disciplines requested:  Physical  Therapy Occupational Therapy     Physical Therapy requested:  Home safety evaluation Ambulation training Transfer training Strengthening exercises Evaluate and treat     Occupational Therapy Requested:  Home safety evaluation ADL or IADL training Cognitive training Evaluate and treat Weight bearing status     Weight Bearing Status (please provide detail): NWB R upper arm   Physician to follow patient's care: PCP   Requested start of care date: Routine (within 48 hours)  Ambulatory Referral to Home Health     If non-routine, reason for priority: PCP is DR.Dettinger   Reason for referral: Home Health Post Rehab   Requested follow up plan: I would resume responsibility.   Disciplines requested:  Physical Therapy Occupational Therapy     Physical Therapy requested:  Home safety evaluation Ambulation training Transfer training Strengthening exercises Evaluate and treat     Occupational Therapy Requested:  Home safety evaluation ADL or IADL training Cognitive training Evaluate and treat     Physician to follow patient's care: PCP   Requested start of care date: Routine (within 48 hours)       Yancey DELENA Reno, MD

## 2023-09-14 ENCOUNTER — Telehealth: Payer: Self-pay

## 2023-09-14 DIAGNOSIS — Z7901 Long term (current) use of anticoagulants: Secondary | ICD-10-CM | POA: Diagnosis not present

## 2023-09-14 DIAGNOSIS — D500 Iron deficiency anemia secondary to blood loss (chronic): Secondary | ICD-10-CM | POA: Diagnosis not present

## 2023-09-14 DIAGNOSIS — M810 Age-related osteoporosis without current pathological fracture: Secondary | ICD-10-CM | POA: Diagnosis not present

## 2023-09-14 DIAGNOSIS — J4489 Other specified chronic obstructive pulmonary disease: Secondary | ICD-10-CM | POA: Diagnosis not present

## 2023-09-14 DIAGNOSIS — S42411B Displaced simple supracondylar fracture without intercondylar fracture of right humerus, initial encounter for open fracture: Secondary | ICD-10-CM | POA: Diagnosis not present

## 2023-09-14 DIAGNOSIS — I482 Chronic atrial fibrillation, unspecified: Secondary | ICD-10-CM | POA: Diagnosis not present

## 2023-09-14 DIAGNOSIS — Z9181 History of falling: Secondary | ICD-10-CM | POA: Diagnosis not present

## 2023-09-14 DIAGNOSIS — I10 Essential (primary) hypertension: Secondary | ICD-10-CM | POA: Diagnosis not present

## 2023-09-14 NOTE — Telephone Encounter (Signed)
 Monique with Home Health made aware to continue PT as instructed. No changes. The wheelchair will be discussed at appt on 2/12. Needs an appt for that type of order.

## 2023-09-14 NOTE — Telephone Encounter (Signed)
 Copied from CRM [id number]. Topic: Clinical - Home Health Verbal Orders >> Sep 14, 2023 12:59 PM Eleanore Grey wrote: Caller/Agency: Joesphine Must Number: [phone] Service Requested: Physical Therapy Frequency: 2 week 1, 1 week 5 Any new concerns about the patient? She also requested a prescription be faxed for a transport wheelchair for the patient

## 2023-09-14 NOTE — Transitions of Care (Post Inpatient/ED Visit) (Signed)
   09/14/2023  Name: Krystal [name] MRN: [id number] DOB: [birth year]  Today's TOC FU Call Status: Today's TOC FU Call Status:: Unsuccessful Call (1st Attempt) Unsuccessful Call (1st Attempt) Date: 09/14/23  Attempted to reach the patient regarding the most recent Inpatient/ED visit.  Follow Up Plan: Additional outreach attempts will be made to reach the patient to complete the Transitions of Care (Post Inpatient/ED visit) call.   Signature Agnes Lawrence, CMA (AAMA)  [facility] [phone]

## 2023-09-15 DIAGNOSIS — H353113 Nonexudative age-related macular degeneration, right eye, advanced atrophic without subfoveal involvement: Secondary | ICD-10-CM | POA: Diagnosis not present

## 2023-09-15 DIAGNOSIS — H35373 Puckering of macula, bilateral: Secondary | ICD-10-CM | POA: Diagnosis not present

## 2023-09-15 DIAGNOSIS — H35033 Hypertensive retinopathy, bilateral: Secondary | ICD-10-CM | POA: Diagnosis not present

## 2023-09-15 DIAGNOSIS — H43393 Other vitreous opacities, bilateral: Secondary | ICD-10-CM | POA: Diagnosis not present

## 2023-09-15 DIAGNOSIS — H43813 Vitreous degeneration, bilateral: Secondary | ICD-10-CM | POA: Diagnosis not present

## 2023-09-15 DIAGNOSIS — H353211 Exudative age-related macular degeneration, right eye, with active choroidal neovascularization: Secondary | ICD-10-CM | POA: Diagnosis not present

## 2023-09-15 DIAGNOSIS — H353124 Nonexudative age-related macular degeneration, left eye, advanced atrophic with subfoveal involvement: Secondary | ICD-10-CM | POA: Diagnosis not present

## 2023-09-16 ENCOUNTER — Encounter: Payer: Self-pay | Admitting: Family Medicine

## 2023-09-16 ENCOUNTER — Ambulatory Visit: Payer: Medicare HMO | Admitting: Family Medicine

## 2023-09-16 VITALS — BP 111/63 | HR 73 | Temp 97.4°F | Ht 63.0 in | Wt 149.0 lb

## 2023-09-16 DIAGNOSIS — R29898 Other symptoms and signs involving the musculoskeletal system: Secondary | ICD-10-CM

## 2023-09-16 DIAGNOSIS — S42401B Unspecified fracture of lower end of right humerus, initial encounter for open fracture: Secondary | ICD-10-CM

## 2023-09-16 DIAGNOSIS — B351 Tinea unguium: Secondary | ICD-10-CM | POA: Diagnosis not present

## 2023-09-16 DIAGNOSIS — S42401D Unspecified fracture of lower end of right humerus, subsequent encounter for fracture with routine healing: Secondary | ICD-10-CM | POA: Diagnosis not present

## 2023-09-16 DIAGNOSIS — W19XXXD Unspecified fall, subsequent encounter: Secondary | ICD-10-CM | POA: Diagnosis not present

## 2023-09-16 NOTE — Progress Notes (Addendum)
 BP 111/63   Pulse 73   Temp (!) 97.4 F (36.3 C)   Ht 5\' 3"  (1.6 m)   Wt 149 lb (67.6 kg)   SpO2 97%   BMI 26.39 kg/m    Subjective:   Patient ID: Krystal [name], female    DOB: [birth year], 83 y.o.   MRN: [id number]  HPI: Krystal [name] is a 83 y.o. female presenting on 09/16/2023 for Rehab follow up (Right arm fracture)   HPI Hospital/rehab follow-up Patient was admitted to the hospital on 08/16/2023 and discharged on 08/21/2023.  She was discharged to SNF for rehab stay on 08/21/2023.  She was discharged from rehab on 09/11/2023.  She was admitted to the hospital for a fall and had an open right supracondylar humeral fracture.  The fall and fracture occurred on 08/14/2023 and then she was sent home from that but then had a lot of bleeding from the arm and was brought back on 08/16/2023.  After the first visit that she went home and tried to bathe her self and that is when the bone popped out and then she started having bleeding.  She went in and had surgery to repair it and then she did a few weeks in rehab and she still doing physical therapy at home and feels like she doing very well with it.  Her biggest thing is she is not as mobile right now because she cannot use her walker because she cannot lean on the arm and would like a wheelchair to help in the meantime.  Relevant past medical, surgical, family and social history reviewed and updated as indicated. Interim medical history since our last visit reviewed. Allergies and medications reviewed and updated.  Review of Systems  Constitutional:  Negative for chills and fever.  Eyes:  Negative for redness and visual disturbance.  Respiratory:  Negative for chest tightness and shortness of breath.   Cardiovascular:  Negative for chest pain and leg swelling.  Musculoskeletal:  Positive for arthralgias. Negative for back pain and gait problem.  Skin:  Negative for rash.  Neurological:  Negative for dizziness, light-headedness and  headaches.  Psychiatric/Behavioral:  Negative for agitation and behavioral problems.   All other systems reviewed and are negative.   Per HPI unless specifically indicated above   Allergies as of 09/16/2023       Reactions   Actonel [risedronate] Nausea And Vomiting, Other (See Comments)   Throat tightness   Fosamax [alendronate Sodium] Other (See Comments)   esophagitis   Mevacor [lovastatin] Other (See Comments)   myalgias   Miacalcin [calcitonin (salmon)] Nausea And Vomiting   Mobic [meloxicam] Hives, Itching   rash        Medication List        Accurate as of September 16, 2023  4:39 PM. If you have any questions, ask your nurse or doctor.          STOP taking these medications    oxyCODONE 5 MG immediate release tablet Commonly known as: Oxy IR/ROXICODONE Stopped by: Elige Radon Anhthu Perdew       TAKE these medications    acetaminophen 500 MG tablet Commonly known as: TYLENOL Take 2 tablets (1,000 mg total) by mouth every 6 (six) hours as needed for mild pain (pain score 1-3) or moderate pain (pain score 4-6).   Calcium Citrate-Vitamin D 315-250 MG-UNIT Tabs Commonly known as: Calcium Citrate + D Take 2 tablets by mouth daily.   diltiazem 180 MG 24 hr capsule  Commonly known as: CARDIZEM CD Take 1 capsule (180 mg total) by mouth daily.   docusate sodium 100 MG capsule Commonly known as: COLACE Take 1 capsule (100 mg total) by mouth 2 (two) times daily.   Eliquis 5 MG Tabs tablet Generic drug: apixaban TAKE 1 TABLET BY MOUTH TWICE A DAY   esomeprazole 40 MG capsule Commonly known as: NEXIUM Take 1 capsule (40 mg total) by mouth daily.   famotidine 20 MG tablet Commonly known as: PEPCID TAKE ONE TABLET DAILY AFTER SUPPER   ferrous sulfate 325 (65 FE) MG tablet Take 1 tablet (325 mg total) by mouth at bedtime.   ipratropium-albuterol 0.5-2.5 (3) MG/3ML Soln Commonly known as: DUONEB Take 3 mLs by nebulization every 6 (six) hours as needed. Dx:   Asthma 493.90   linaclotide 72 MCG capsule Commonly known as: Linzess Take 1 capsule (72 mcg total) by mouth daily before breakfast.   methocarbamol 500 MG tablet Commonly known as: ROBAXIN Take 1 tablet (500 mg total) by mouth every 8 (eight) hours as needed for muscle spasms.   metoprolol tartrate 25 MG tablet Commonly known as: LOPRESSOR Take 0.5 tablets (12.5 mg total) by mouth 2 (two) times daily.   montelukast 10 MG tablet Commonly known as: SINGULAIR TAKE 1 TABLET BY MOUTH EVERYDAY AT BEDTIME   polyethylene glycol 17 g packet Commonly known as: MIRALAX / GLYCOLAX Take 17 g by mouth daily as needed for mild constipation.   PRESERVISION/LUTEIN PO Take 1 capsule by mouth at bedtime.   rosuvastatin 20 MG tablet Commonly known as: CRESTOR TAKE 1 TABLET BY MOUTH EVERY DAY   sertraline 50 MG tablet Commonly known as: ZOLOFT Take 1 tablet (50 mg total) by mouth daily.   Vitamin D3 125 MCG (5000 UT) Caps Take 5,000 Units by mouth daily.               Durable Medical Equipment  (From admission, onward)           Start     Ordered   09/16/23 0000  DME Wheelchair manual       Comments: Patient suffers from lower extremity weakness and recent right humeral fracture.  which impairs their ability to perform daily activities like bathing, dressing, grooming, and toileting in the home.  A cane, crutch, or walker will not resolve issue with performing activities of daily living because of her right humeral fracture, she used to use a walker but cannot use it right now because of her right humeral fracture. A wheelchair will allow patient to safely perform daily activities. Patient can safely propel the wheelchair in the home or has a caregiver who can provide assistance. Length of need 6 months . Accessories: elevating leg rests (ELRs), wheel locks, extensions and anti-tippers.   09/16/23 1632             Objective:   BP 111/63   Pulse 73   Temp (!) 97.4 F  (36.3 C)   Ht 5\' 3"  (1.6 m)   Wt 149 lb (67.6 kg)   SpO2 97%   BMI 26.39 kg/m   Wt Readings from Last 3 Encounters:  09/16/23 149 lb (67.6 kg)  08/16/23 162 lb 0.6 oz (73.5 kg)  05/22/23 162 lb (73.5 kg)    Physical Exam Vitals and nursing note reviewed.  Constitutional:      General: She is not in acute distress.    Appearance: She is well-developed. She is not diaphoretic.  Eyes:  Conjunctiva/sclera: Conjunctivae normal.  Cardiovascular:     Rate and Rhythm: Normal rate and regular rhythm.     Heart sounds: Normal heart sounds. No murmur heard. Pulmonary:     Effort: Pulmonary effort is normal. No respiratory distress.     Breath sounds: Normal breath sounds. No wheezing.  Musculoskeletal:        General: Swelling and tenderness (Swelling and slight tenderness in the right forearm.  Wrist drop and right forearm as well) present. Normal range of motion.  Skin:    General: Skin is warm and dry.     Findings: No rash.     Comments: Thick and yellow fingernails and thickened yellow toenails.  Neurological:     Mental Status: She is alert and oriented to person, place, and time.     Coordination: Coordination normal.  Psychiatric:        Behavior: Behavior normal.       Assessment & Plan:   Problem List Items Addressed This Visit       Musculoskeletal and Integument   Open fracture of distal end of right humerus, unspecified fracture morphology, initial encounter   Relevant Orders   DME Wheelchair manual   Other Visit Diagnoses       Fall, subsequent encounter    -  Primary   Relevant Orders   DME Wheelchair manual     Weakness of both lower extremities       Relevant Orders   DME Wheelchair manual     Onychomycosis       Relevant Orders   Ambulatory referral to Podiatry       Gave prescription for wheelchair and also gave patient a sling that she can use to help support it.  She is still working on rehab at home  Follow-up as needed.  Continue with  therapy to prevent future falls.  Patient suffers from lower extremity weakness and recent right humeral fracture. which impairs their ability to perform daily activities like bathing, dressing, grooming, and toileting in the home. A cane, crutch, or walker will not resolve issue with performing activities of daily living because of her right humeral fracture, she used to use a walker but cannot use it right now because of her right humeral fracture. A wheelchair will allow patient to safely perform daily activities. Patient can safely propel the wheelchair in the home or has a caregiver who can provide assistance. Length of need 6 months . Accessories: elevating leg rests (ELRs), wheel locks, extensions and anti-tippers.   Follow up plan: Return if symptoms worsen or fail to improve.  Counseling provided for all of the vaccine components Orders Placed This Encounter  Procedures   DME Wheelchair manual   Ambulatory referral to Podiatry    Arville Care, MD [facility] 09/16/2023, 4:39 PM

## 2023-09-16 NOTE — Transitions of Care (Post Inpatient/ED Visit) (Signed)
   09/16/2023  Name: Krystal [name] MRN: [id number] DOB: Apr 29, 1941  Today's TOC FU Call Status: Today's TOC FU Call Status:: Unsuccessful Call (2nd Attempt) Unsuccessful Call (1st Attempt) Date: 09/14/23 Unsuccessful Call (2nd Attempt) Date: 09/16/23  Attempted to reach the patient regarding the most recent Inpatient/ED visit.  Follow Up Plan: No further outreach attempts will be made at this time. We have been unable to contact the patient. Seen by PCP already.  Signature Agnes Lawrence, CMA (AAMA)  [facility] [phone]

## 2023-09-18 ENCOUNTER — Telehealth: Payer: Self-pay | Admitting: *Deleted

## 2023-09-18 DIAGNOSIS — Z9181 History of falling: Secondary | ICD-10-CM | POA: Diagnosis not present

## 2023-09-18 DIAGNOSIS — I482 Chronic atrial fibrillation, unspecified: Secondary | ICD-10-CM | POA: Diagnosis not present

## 2023-09-18 DIAGNOSIS — J4489 Other specified chronic obstructive pulmonary disease: Secondary | ICD-10-CM | POA: Diagnosis not present

## 2023-09-18 DIAGNOSIS — S42411B Displaced simple supracondylar fracture without intercondylar fracture of right humerus, initial encounter for open fracture: Secondary | ICD-10-CM | POA: Diagnosis not present

## 2023-09-18 DIAGNOSIS — I10 Essential (primary) hypertension: Secondary | ICD-10-CM | POA: Diagnosis not present

## 2023-09-18 DIAGNOSIS — Z7901 Long term (current) use of anticoagulants: Secondary | ICD-10-CM | POA: Diagnosis not present

## 2023-09-18 DIAGNOSIS — M810 Age-related osteoporosis without current pathological fracture: Secondary | ICD-10-CM | POA: Diagnosis not present

## 2023-09-18 DIAGNOSIS — D500 Iron deficiency anemia secondary to blood loss (chronic): Secondary | ICD-10-CM | POA: Diagnosis not present

## 2023-09-18 NOTE — Telephone Encounter (Signed)
RETURNED CALL, NO ANSWER

## 2023-09-22 DIAGNOSIS — M810 Age-related osteoporosis without current pathological fracture: Secondary | ICD-10-CM | POA: Diagnosis not present

## 2023-09-22 DIAGNOSIS — Z9181 History of falling: Secondary | ICD-10-CM | POA: Diagnosis not present

## 2023-09-22 DIAGNOSIS — I482 Chronic atrial fibrillation, unspecified: Secondary | ICD-10-CM | POA: Diagnosis not present

## 2023-09-22 DIAGNOSIS — I10 Essential (primary) hypertension: Secondary | ICD-10-CM | POA: Diagnosis not present

## 2023-09-22 DIAGNOSIS — S42411B Displaced simple supracondylar fracture without intercondylar fracture of right humerus, initial encounter for open fracture: Secondary | ICD-10-CM | POA: Diagnosis not present

## 2023-09-22 DIAGNOSIS — Z7901 Long term (current) use of anticoagulants: Secondary | ICD-10-CM | POA: Diagnosis not present

## 2023-09-22 DIAGNOSIS — D500 Iron deficiency anemia secondary to blood loss (chronic): Secondary | ICD-10-CM | POA: Diagnosis not present

## 2023-09-22 DIAGNOSIS — J4489 Other specified chronic obstructive pulmonary disease: Secondary | ICD-10-CM | POA: Diagnosis not present

## 2023-09-24 ENCOUNTER — Ambulatory Visit: Payer: Medicare HMO

## 2023-09-24 ENCOUNTER — Telehealth: Payer: Self-pay | Admitting: Family Medicine

## 2023-09-24 DIAGNOSIS — Z9181 History of falling: Secondary | ICD-10-CM

## 2023-09-24 DIAGNOSIS — D500 Iron deficiency anemia secondary to blood loss (chronic): Secondary | ICD-10-CM | POA: Diagnosis not present

## 2023-09-24 DIAGNOSIS — Z7901 Long term (current) use of anticoagulants: Secondary | ICD-10-CM | POA: Diagnosis not present

## 2023-09-24 DIAGNOSIS — J4489 Other specified chronic obstructive pulmonary disease: Secondary | ICD-10-CM | POA: Diagnosis not present

## 2023-09-24 DIAGNOSIS — S42411B Displaced simple supracondylar fracture without intercondylar fracture of right humerus, initial encounter for open fracture: Secondary | ICD-10-CM | POA: Diagnosis not present

## 2023-09-24 DIAGNOSIS — I482 Chronic atrial fibrillation, unspecified: Secondary | ICD-10-CM | POA: Diagnosis not present

## 2023-09-24 DIAGNOSIS — I10 Essential (primary) hypertension: Secondary | ICD-10-CM | POA: Diagnosis not present

## 2023-09-24 DIAGNOSIS — M810 Age-related osteoporosis without current pathological fracture: Secondary | ICD-10-CM

## 2023-09-24 NOTE — Telephone Encounter (Signed)
Please have order comments in chart notes

## 2023-09-24 NOTE — Telephone Encounter (Signed)
Copied from CRM [id number]. Topic: General - Call Back - No Documentation >> Sep 24, 2023 11:46 AM Higinio Roger wrote: Amie from [facility] would like a callback to discuss a wheelchair referral for the patient. Amie states she needs the comments on Rx on a separate document for insurance purposes. Callback #: [phone]; Fax [phone]

## 2023-09-25 DIAGNOSIS — Z7901 Long term (current) use of anticoagulants: Secondary | ICD-10-CM | POA: Diagnosis not present

## 2023-09-25 DIAGNOSIS — D500 Iron deficiency anemia secondary to blood loss (chronic): Secondary | ICD-10-CM | POA: Diagnosis not present

## 2023-09-25 DIAGNOSIS — I482 Chronic atrial fibrillation, unspecified: Secondary | ICD-10-CM | POA: Diagnosis not present

## 2023-09-25 DIAGNOSIS — J4489 Other specified chronic obstructive pulmonary disease: Secondary | ICD-10-CM | POA: Diagnosis not present

## 2023-09-25 DIAGNOSIS — I10 Essential (primary) hypertension: Secondary | ICD-10-CM | POA: Diagnosis not present

## 2023-09-25 DIAGNOSIS — S42411B Displaced simple supracondylar fracture without intercondylar fracture of right humerus, initial encounter for open fracture: Secondary | ICD-10-CM | POA: Diagnosis not present

## 2023-09-25 DIAGNOSIS — Z9181 History of falling: Secondary | ICD-10-CM | POA: Diagnosis not present

## 2023-09-25 DIAGNOSIS — M810 Age-related osteoporosis without current pathological fracture: Secondary | ICD-10-CM | POA: Diagnosis not present

## 2023-09-28 NOTE — Telephone Encounter (Signed)
 Please add comments from order to notes

## 2023-09-28 NOTE — Telephone Encounter (Signed)
 Amy with [facility] requesting for narrative for Rx to be put in a separate document.   The rx needs to be one document and then everything in the comments to be in another for insurance purposes.   Fax number: [phone]

## 2023-09-30 DIAGNOSIS — D500 Iron deficiency anemia secondary to blood loss (chronic): Secondary | ICD-10-CM | POA: Diagnosis not present

## 2023-09-30 DIAGNOSIS — S42411B Displaced simple supracondylar fracture without intercondylar fracture of right humerus, initial encounter for open fracture: Secondary | ICD-10-CM | POA: Diagnosis not present

## 2023-09-30 DIAGNOSIS — Z9181 History of falling: Secondary | ICD-10-CM | POA: Diagnosis not present

## 2023-09-30 DIAGNOSIS — Z7901 Long term (current) use of anticoagulants: Secondary | ICD-10-CM | POA: Diagnosis not present

## 2023-09-30 DIAGNOSIS — S42201A Unspecified fracture of upper end of right humerus, initial encounter for closed fracture: Secondary | ICD-10-CM | POA: Diagnosis not present

## 2023-09-30 DIAGNOSIS — J4489 Other specified chronic obstructive pulmonary disease: Secondary | ICD-10-CM | POA: Diagnosis not present

## 2023-09-30 DIAGNOSIS — M810 Age-related osteoporosis without current pathological fracture: Secondary | ICD-10-CM | POA: Diagnosis not present

## 2023-09-30 DIAGNOSIS — I10 Essential (primary) hypertension: Secondary | ICD-10-CM | POA: Diagnosis not present

## 2023-09-30 DIAGNOSIS — I482 Chronic atrial fibrillation, unspecified: Secondary | ICD-10-CM | POA: Diagnosis not present

## 2023-10-02 NOTE — Telephone Encounter (Signed)
 OV notes faxed to [facility] at [phone]

## 2023-10-06 DIAGNOSIS — S42411D Displaced simple supracondylar fracture without intercondylar fracture of right humerus, subsequent encounter for fracture with routine healing: Secondary | ICD-10-CM | POA: Diagnosis not present

## 2023-10-07 DIAGNOSIS — I10 Essential (primary) hypertension: Secondary | ICD-10-CM | POA: Diagnosis not present

## 2023-10-07 DIAGNOSIS — S42411B Displaced simple supracondylar fracture without intercondylar fracture of right humerus, initial encounter for open fracture: Secondary | ICD-10-CM | POA: Diagnosis not present

## 2023-10-07 DIAGNOSIS — M810 Age-related osteoporosis without current pathological fracture: Secondary | ICD-10-CM | POA: Diagnosis not present

## 2023-10-07 DIAGNOSIS — I482 Chronic atrial fibrillation, unspecified: Secondary | ICD-10-CM | POA: Diagnosis not present

## 2023-10-07 DIAGNOSIS — J4489 Other specified chronic obstructive pulmonary disease: Secondary | ICD-10-CM | POA: Diagnosis not present

## 2023-10-07 DIAGNOSIS — Z9181 History of falling: Secondary | ICD-10-CM | POA: Diagnosis not present

## 2023-10-07 DIAGNOSIS — D500 Iron deficiency anemia secondary to blood loss (chronic): Secondary | ICD-10-CM | POA: Diagnosis not present

## 2023-10-07 DIAGNOSIS — Z7901 Long term (current) use of anticoagulants: Secondary | ICD-10-CM | POA: Diagnosis not present

## 2023-10-08 DIAGNOSIS — I10 Essential (primary) hypertension: Secondary | ICD-10-CM | POA: Diagnosis not present

## 2023-10-08 DIAGNOSIS — Z9181 History of falling: Secondary | ICD-10-CM | POA: Diagnosis not present

## 2023-10-08 DIAGNOSIS — D500 Iron deficiency anemia secondary to blood loss (chronic): Secondary | ICD-10-CM | POA: Diagnosis not present

## 2023-10-08 DIAGNOSIS — S42411B Displaced simple supracondylar fracture without intercondylar fracture of right humerus, initial encounter for open fracture: Secondary | ICD-10-CM | POA: Diagnosis not present

## 2023-10-08 DIAGNOSIS — M810 Age-related osteoporosis without current pathological fracture: Secondary | ICD-10-CM | POA: Diagnosis not present

## 2023-10-08 DIAGNOSIS — I482 Chronic atrial fibrillation, unspecified: Secondary | ICD-10-CM | POA: Diagnosis not present

## 2023-10-08 DIAGNOSIS — Z7901 Long term (current) use of anticoagulants: Secondary | ICD-10-CM | POA: Diagnosis not present

## 2023-10-08 DIAGNOSIS — J4489 Other specified chronic obstructive pulmonary disease: Secondary | ICD-10-CM | POA: Diagnosis not present

## 2023-10-12 DIAGNOSIS — Z9181 History of falling: Secondary | ICD-10-CM | POA: Diagnosis not present

## 2023-10-12 DIAGNOSIS — I482 Chronic atrial fibrillation, unspecified: Secondary | ICD-10-CM | POA: Diagnosis not present

## 2023-10-12 DIAGNOSIS — I10 Essential (primary) hypertension: Secondary | ICD-10-CM | POA: Diagnosis not present

## 2023-10-12 DIAGNOSIS — J4489 Other specified chronic obstructive pulmonary disease: Secondary | ICD-10-CM | POA: Diagnosis not present

## 2023-10-12 DIAGNOSIS — M810 Age-related osteoporosis without current pathological fracture: Secondary | ICD-10-CM | POA: Diagnosis not present

## 2023-10-12 DIAGNOSIS — S42411B Displaced simple supracondylar fracture without intercondylar fracture of right humerus, initial encounter for open fracture: Secondary | ICD-10-CM | POA: Diagnosis not present

## 2023-10-12 DIAGNOSIS — D500 Iron deficiency anemia secondary to blood loss (chronic): Secondary | ICD-10-CM | POA: Diagnosis not present

## 2023-10-12 DIAGNOSIS — Z7901 Long term (current) use of anticoagulants: Secondary | ICD-10-CM | POA: Diagnosis not present

## 2023-10-13 DIAGNOSIS — S42411B Displaced simple supracondylar fracture without intercondylar fracture of right humerus, initial encounter for open fracture: Secondary | ICD-10-CM | POA: Diagnosis not present

## 2023-10-13 DIAGNOSIS — I10 Essential (primary) hypertension: Secondary | ICD-10-CM | POA: Diagnosis not present

## 2023-10-13 DIAGNOSIS — I482 Chronic atrial fibrillation, unspecified: Secondary | ICD-10-CM | POA: Diagnosis not present

## 2023-10-13 DIAGNOSIS — Z9181 History of falling: Secondary | ICD-10-CM | POA: Diagnosis not present

## 2023-10-13 DIAGNOSIS — M810 Age-related osteoporosis without current pathological fracture: Secondary | ICD-10-CM | POA: Diagnosis not present

## 2023-10-13 DIAGNOSIS — J4489 Other specified chronic obstructive pulmonary disease: Secondary | ICD-10-CM | POA: Diagnosis not present

## 2023-10-13 DIAGNOSIS — D500 Iron deficiency anemia secondary to blood loss (chronic): Secondary | ICD-10-CM | POA: Diagnosis not present

## 2023-10-13 DIAGNOSIS — Z7901 Long term (current) use of anticoagulants: Secondary | ICD-10-CM | POA: Diagnosis not present

## 2023-10-15 DIAGNOSIS — S42411B Displaced simple supracondylar fracture without intercondylar fracture of right humerus, initial encounter for open fracture: Secondary | ICD-10-CM | POA: Diagnosis not present

## 2023-10-15 DIAGNOSIS — M810 Age-related osteoporosis without current pathological fracture: Secondary | ICD-10-CM | POA: Diagnosis not present

## 2023-10-15 DIAGNOSIS — B351 Tinea unguium: Secondary | ICD-10-CM | POA: Diagnosis not present

## 2023-10-15 DIAGNOSIS — D500 Iron deficiency anemia secondary to blood loss (chronic): Secondary | ICD-10-CM | POA: Diagnosis not present

## 2023-10-15 DIAGNOSIS — M79674 Pain in right toe(s): Secondary | ICD-10-CM | POA: Diagnosis not present

## 2023-10-15 DIAGNOSIS — Z7901 Long term (current) use of anticoagulants: Secondary | ICD-10-CM | POA: Diagnosis not present

## 2023-10-15 DIAGNOSIS — J4489 Other specified chronic obstructive pulmonary disease: Secondary | ICD-10-CM | POA: Diagnosis not present

## 2023-10-15 DIAGNOSIS — Z9181 History of falling: Secondary | ICD-10-CM | POA: Diagnosis not present

## 2023-10-15 DIAGNOSIS — M79675 Pain in left toe(s): Secondary | ICD-10-CM | POA: Diagnosis not present

## 2023-10-15 DIAGNOSIS — I10 Essential (primary) hypertension: Secondary | ICD-10-CM | POA: Diagnosis not present

## 2023-10-15 DIAGNOSIS — I482 Chronic atrial fibrillation, unspecified: Secondary | ICD-10-CM | POA: Diagnosis not present

## 2023-10-22 DIAGNOSIS — Z7901 Long term (current) use of anticoagulants: Secondary | ICD-10-CM | POA: Diagnosis not present

## 2023-10-22 DIAGNOSIS — D500 Iron deficiency anemia secondary to blood loss (chronic): Secondary | ICD-10-CM | POA: Diagnosis not present

## 2023-10-22 DIAGNOSIS — S42411B Displaced simple supracondylar fracture without intercondylar fracture of right humerus, initial encounter for open fracture: Secondary | ICD-10-CM | POA: Diagnosis not present

## 2023-10-22 DIAGNOSIS — I482 Chronic atrial fibrillation, unspecified: Secondary | ICD-10-CM | POA: Diagnosis not present

## 2023-10-22 DIAGNOSIS — I10 Essential (primary) hypertension: Secondary | ICD-10-CM | POA: Diagnosis not present

## 2023-10-22 DIAGNOSIS — M810 Age-related osteoporosis without current pathological fracture: Secondary | ICD-10-CM | POA: Diagnosis not present

## 2023-10-22 DIAGNOSIS — Z9181 History of falling: Secondary | ICD-10-CM | POA: Diagnosis not present

## 2023-10-22 DIAGNOSIS — J4489 Other specified chronic obstructive pulmonary disease: Secondary | ICD-10-CM | POA: Diagnosis not present

## 2023-10-23 ENCOUNTER — Encounter: Payer: Self-pay | Admitting: Family Medicine

## 2023-10-23 ENCOUNTER — Ambulatory Visit (INDEPENDENT_AMBULATORY_CARE_PROVIDER_SITE_OTHER): Admitting: Family Medicine

## 2023-10-23 VITALS — BP 107/57 | HR 80 | Temp 98.2°F | Ht 63.0 in | Wt 148.0 lb

## 2023-10-23 DIAGNOSIS — R32 Unspecified urinary incontinence: Secondary | ICD-10-CM | POA: Diagnosis not present

## 2023-10-23 DIAGNOSIS — E782 Mixed hyperlipidemia: Secondary | ICD-10-CM | POA: Diagnosis not present

## 2023-10-23 DIAGNOSIS — F339 Major depressive disorder, recurrent, unspecified: Secondary | ICD-10-CM | POA: Diagnosis not present

## 2023-10-23 DIAGNOSIS — S42401D Unspecified fracture of lower end of right humerus, subsequent encounter for fracture with routine healing: Secondary | ICD-10-CM

## 2023-10-23 DIAGNOSIS — R29898 Other symptoms and signs involving the musculoskeletal system: Secondary | ICD-10-CM

## 2023-10-23 DIAGNOSIS — I5042 Chronic combined systolic (congestive) and diastolic (congestive) heart failure: Secondary | ICD-10-CM

## 2023-10-23 DIAGNOSIS — M51369 Other intervertebral disc degeneration, lumbar region without mention of lumbar back pain or lower extremity pain: Secondary | ICD-10-CM

## 2023-10-23 DIAGNOSIS — F411 Generalized anxiety disorder: Secondary | ICD-10-CM | POA: Diagnosis not present

## 2023-10-23 DIAGNOSIS — H548 Legal blindness, as defined in USA: Secondary | ICD-10-CM

## 2023-10-23 DIAGNOSIS — S42401B Unspecified fracture of lower end of right humerus, initial encounter for open fracture: Secondary | ICD-10-CM

## 2023-10-23 MED ORDER — ESOMEPRAZOLE MAGNESIUM 40 MG PO CPDR: 40.0000 mg | DELAYED_RELEASE_CAPSULE | Freq: Every day | ORAL | 3 refills | Status: AC

## 2023-10-23 MED ORDER — MONTELUKAST SODIUM 10 MG PO TABS: ORAL_TABLET | ORAL | 3 refills | Status: AC

## 2023-10-23 MED ORDER — SERTRALINE HCL 100 MG PO TABS: 100.0000 mg | ORAL_TABLET | Freq: Every day | ORAL | 3 refills | Status: AC

## 2023-10-23 NOTE — Addendum Note (Signed)
 Addended by: Arville Care on: 10/23/2023 01:52 PM   Modules accepted: Orders

## 2023-10-23 NOTE — Progress Notes (Addendum)
 BP (!) 107/57   Pulse 80   Temp 98.2 F (36.8 C)   Ht 5\' 3"  (1.6 m)   Wt 148 lb (67.1 kg)   SpO2 95%   BMI 26.22 kg/m    Subjective:   Patient ID: Krystal [name], female    DOB: 1940/11/25, 83 y.o.   MRN: [id number]  HPI: Krystal [name] is a 83 y.o. female presenting on 10/23/2023 for Medical Management of Chronic Issues, Hyperlipidemia, and Arm Injury (Right. Req referral to PT)   HPI Hyperlipidemia Patient is coming in for recheck of his hyperlipidemia. The patient is currently taking Crestor , sees cardiologist. They deny any issues with myalgias or history of liver damage from it. They deny any focal numbness or weakness or chest pain.   Anxiety and Depression Not doing was well with anxiety.  Says she still has a lot of anxiety and she would like her Valium  back but with her blood pressure being low and her recent falls and fractures I do not know that that is a good idea.  She is currently taking her Zoloft  but does not feel like it is as good as the Valium .    10/23/2023    1:15 PM 09/16/2023    4:27 PM 03/16/2023    4:02 PM 08/13/2022    1:50 PM 02/10/2022    4:23 PM  Depression screen PHQ 2/9  Decreased Interest 1 0 0 0 0  Down, Depressed, Hopeless 2 0 0 0 0  PHQ - 2 Score 3 0 0 0 0  Altered sleeping 3  1    Tired, decreased energy 1  0    Change in appetite 0  0    Feeling bad or failure about yourself  0  0    Trouble concentrating 1  0    Moving slowly or fidgety/restless 2  0    Suicidal thoughts 0  0    PHQ-9 Score 10  1    Difficult doing work/chores Somewhat difficult  Not difficult at all      Right humerus fracture Patient has been doing home physical therapy for the right humerus fracture but the home therapy is ended so now she wants to go do in person therapy for it to continue to gain her strength back.  She feels somewhat weak in the legs as well.  She would like to discuss that with them as well  Patient has urinary incontinence and uses  depends and wants prescription for this  Relevant past medical, surgical, family and social history reviewed and updated as indicated. Interim medical history since our last visit reviewed. Allergies and medications reviewed and updated.  Review of Systems  Constitutional:  Negative for chills and fever.  HENT:  Negative for congestion, ear discharge and ear pain.   Eyes:  Negative for redness and visual disturbance.  Respiratory:  Negative for chest tightness and shortness of breath.   Cardiovascular:  Negative for chest pain and leg swelling.  Genitourinary:  Negative for difficulty urinating and dysuria.  Musculoskeletal:  Negative for back pain and gait problem.  Skin:  Negative for rash.  Neurological:  Negative for light-headedness and headaches.  Psychiatric/Behavioral:  Positive for dysphoric mood. Negative for agitation, behavioral problems, self-injury, sleep disturbance and suicidal ideas. The patient is nervous/anxious.   All other systems reviewed and are negative.   Per HPI unless specifically indicated above   Allergies as of 10/23/2023       Reactions  Actonel [risedronate] Nausea And Vomiting, Other (See Comments)   Throat tightness   Fosamax [alendronate Sodium] Other (See Comments)   esophagitis   Mevacor [lovastatin] Other (See Comments)   myalgias   Miacalcin [calcitonin (salmon)] Nausea And Vomiting   Mobic  [meloxicam ] Hives, Itching   rash        Medication List        Accurate as of October 23, 2023  1:52 PM. If you have any questions, ask your nurse or doctor.          acetaminophen  500 MG tablet Commonly known as: TYLENOL  Take 2 tablets (1,000 mg total) by mouth every 6 (six) hours as needed for mild pain (pain score 1-3) or moderate pain (pain score 4-6).   Calcium  Citrate-Vitamin D  315-250 MG-UNIT Tabs Commonly known as: Calcium  Citrate + D Take 2 tablets by mouth daily.   diltiazem  180 MG 24 hr capsule Commonly known as: CARDIZEM   CD Take 1 capsule (180 mg total) by mouth daily.   docusate sodium  100 MG capsule Commonly known as: COLACE Take 1 capsule (100 mg total) by mouth 2 (two) times daily.   Eliquis  5 MG Tabs tablet Generic drug: apixaban  TAKE 1 TABLET BY MOUTH TWICE A DAY   esomeprazole  40 MG capsule Commonly known as: NEXIUM  Take 1 capsule (40 mg total) by mouth daily.   famotidine  20 MG tablet Commonly known as: PEPCID  TAKE ONE TABLET DAILY AFTER SUPPER   ferrous sulfate  325 (65 FE) MG tablet Take 1 tablet (325 mg total) by mouth at bedtime.   ipratropium-albuterol  0.5-2.5 (3) MG/3ML Soln Commonly known as: DUONEB Take 3 mLs by nebulization every 6 (six) hours as needed. Dx:  Asthma 493.90   linaclotide  72 MCG capsule Commonly known as: Linzess  Take 1 capsule (72 mcg total) by mouth daily before breakfast.   methocarbamol  500 MG tablet Commonly known as: ROBAXIN  Take 1 tablet (500 mg total) by mouth every 8 (eight) hours as needed for muscle spasms.   metoprolol  tartrate 25 MG tablet Commonly known as: LOPRESSOR  Take 0.5 tablets (12.5 mg total) by mouth 2 (two) times daily.   montelukast  10 MG tablet Commonly known as: SINGULAIR  TAKE 1 TABLET BY MOUTH EVERYDAY AT BEDTIME   polyethylene glycol 17 g packet Commonly known as: MIRALAX  / GLYCOLAX  Take 17 g by mouth daily as needed for mild constipation.   PRESERVISION/LUTEIN PO Take 1 capsule by mouth at bedtime.   rosuvastatin  20 MG tablet Commonly known as: CRESTOR  TAKE 1 TABLET BY MOUTH EVERY DAY   sertraline  100 MG tablet Commonly known as: ZOLOFT  Take 1 tablet (100 mg total) by mouth daily. What changed:  medication strength how much to take Changed by: Lucio Sabin Braven Wolk   Vitamin D3 125 MCG (5000 UT) Caps Take 5,000 Units by mouth daily.               Durable Medical Equipment  (From admission, onward)           Start     Ordered   10/23/23 0000  For home use only DME Other see comment       Comments:  Diagnosis urinary incontinence  medium size depends adult diapers, 4/day, give a 5-month supply with 3 refills  Question:  Length of Need  Answer:  Lifetime   10/23/23 1351             Objective:   BP (!) 107/57   Pulse 80   Temp 98.2 F (36.8 C)  Ht 5\' 3"  (1.6 m)   Wt 148 lb (67.1 kg)   SpO2 95%   BMI 26.22 kg/m   Wt Readings from Last 3 Encounters:  10/23/23 148 lb (67.1 kg)  09/16/23 149 lb (67.6 kg)  08/16/23 162 lb 0.6 oz (73.5 kg)    Physical Exam Vitals and nursing note reviewed.  Constitutional:      General: She is not in acute distress.    Appearance: She is well-developed. She is not diaphoretic.  Eyes:     Conjunctiva/sclera: Conjunctivae normal.  Cardiovascular:     Rate and Rhythm: Normal rate and regular rhythm.     Heart sounds: Normal heart sounds. No murmur heard. Pulmonary:     Effort: Pulmonary effort is normal. No respiratory distress.     Breath sounds: Normal breath sounds. No wheezing.  Musculoskeletal:        General: No swelling. Normal range of motion.  Skin:    General: Skin is warm and dry.     Findings: No rash.  Neurological:     Mental Status: She is alert and oriented to person, place, and time.     Coordination: Coordination normal.  Psychiatric:        Behavior: Behavior normal.       Assessment & Plan:   Problem List Items Addressed This Visit       Cardiovascular and Mediastinum   Chronic combined systolic and diastolic heart failure (HCC)     Musculoskeletal and Integument   Open fracture of distal end of right humerus, unspecified fracture morphology, initial encounter   Relevant Orders   Ambulatory referral to Physical Therapy     Other   Mixed hyperlipidemia - Primary   Relevant Orders   Lipid panel (Completed)   CMP14+EGFR (Completed)   Anxiety state   Relevant Medications   sertraline  (ZOLOFT ) 100 MG tablet   Depression, recurrent (HCC)   Relevant Medications   sertraline  (ZOLOFT ) 100 MG tablet    Other Relevant Orders   TSH (Completed)   Legally blind   Other Visit Diagnoses       Weakness of both lower extremities       Relevant Orders   Ambulatory referral to Physical Therapy     Urinary incontinence, unspecified type       Relevant Orders   For home use only DME Other see comment     Degeneration of intervertebral disc of lumbar region, unspecified whether pain present          No other changes Increase Zoloft  to 100 mg daily.  Urinary incontinence likely worsened because of her CHF and blindness ordered supplies for her urinary incontinence.  Has degenerative disc disease in her history of an MRI in 2018 and has difficulty making it to the restroom in time and needs incontinence supplies. Follow up plan: Return in about 6 months (around 04/24/2024), or if symptoms worsen or fail to improve, for Hyperlipidemia.  Counseling provided for all of the vaccine components Orders Placed This Encounter  Procedures   For home use only DME Other see comment   Lipid panel   CMP14+EGFR   TSH   Ambulatory referral to Physical Therapy    Jolyne Needs, MD [facility] Gr[facility] 10/23/2023, 1:52 PM

## 2023-10-24 LAB — CMP14+EGFR
ALT: 19 IU/L (ref 0–32)
AST: 28 IU/L (ref 0–40)
Albumin: 3.9 g/dL (ref 3.7–4.7)
Alkaline Phosphatase: 68 IU/L (ref 44–121)
BUN/Creatinine Ratio: 26 (ref 12–28)
BUN: 22 mg/dL (ref 8–27)
Bilirubin Total: 0.2 mg/dL (ref 0.0–1.2)
CO2: 25 mmol/L (ref 20–29)
Calcium: 9.1 mg/dL (ref 8.7–10.3)
Chloride: 104 mmol/L (ref 96–106)
Creatinine, Ser: 0.84 mg/dL (ref 0.57–1.00)
Globulin, Total: 2 g/dL (ref 1.5–4.5)
Glucose: 102 mg/dL — ABNORMAL HIGH (ref 70–99)
Potassium: 3.8 mmol/L (ref 3.5–5.2)
Sodium: 143 mmol/L (ref 134–144)
Total Protein: 5.9 g/dL — ABNORMAL LOW (ref 6.0–8.5)
eGFR: 68 mL/min/{1.73_m2} (ref >59)

## 2023-10-24 LAB — LIPID PANEL
Chol/HDL Ratio: 2.2 ratio (ref 0.0–4.4)
Cholesterol, Total: 131 mg/dL (ref 100–199)
HDL: 59 mg/dL (ref >39)
LDL Chol Calc (NIH): 47 mg/dL (ref 0–99)
Triglycerides: 147 mg/dL (ref 0–149)
VLDL Cholesterol Cal: 25 mg/dL (ref 5–40)

## 2023-10-24 LAB — TSH: TSH: 2.25 u[IU]/mL (ref 0.450–4.500)

## 2023-10-29 ENCOUNTER — Telehealth: Payer: Self-pay | Admitting: Family Medicine

## 2023-10-29 ENCOUNTER — Encounter: Payer: Self-pay | Admitting: Family Medicine

## 2023-10-29 NOTE — Telephone Encounter (Signed)
 Copied notes copied to patients lab work

## 2023-10-29 NOTE — Telephone Encounter (Signed)
 Copied from CRM [id number]. Topic: Clinical - Lab/Test Results >> Oct 29, 2023  2:02 PM Ivette P wrote: Reason for CRM: Pt called in about results and a missed call.    Read results as follows: Dettinger, Elige Radon, MD to [facility] Clinical  Regarding results: 3     "10/29/23  8:04 AM Result Note Kidney function and liver function look good.  Cholesterol looks good.  Thyroid looks good.  No changes"   Pt understood, no further questions.

## 2023-11-02 ENCOUNTER — Telehealth: Payer: Self-pay

## 2023-11-02 NOTE — Telephone Encounter (Signed)
 Copied from CRM [id number]. Topic: General - Other >> Nov 02, 2023 10:29 AM Turkey B wrote: Reason for CRM: [place] from [employer], called in says needs dx code related to needing incontinence supplies, Caller states,  needs to be for diabetes, overactive bladder, or bladder disorder. Fx is [phone]

## 2023-11-02 NOTE — Telephone Encounter (Signed)
 Diagnosis is Urinary Incontinence R32. Faxed last OV note making aware.

## 2023-11-03 DIAGNOSIS — S42411D Displaced simple supracondylar fracture without intercondylar fracture of right humerus, subsequent encounter for fracture with routine healing: Secondary | ICD-10-CM | POA: Diagnosis not present

## 2023-11-03 DIAGNOSIS — S42201A Unspecified fracture of upper end of right humerus, initial encounter for closed fracture: Secondary | ICD-10-CM | POA: Diagnosis not present

## 2023-11-09 DIAGNOSIS — H353211 Exudative age-related macular degeneration, right eye, with active choroidal neovascularization: Secondary | ICD-10-CM | POA: Diagnosis not present

## 2023-11-10 ENCOUNTER — Ambulatory Visit: Attending: Family Medicine

## 2023-11-10 DIAGNOSIS — M25521 Pain in right elbow: Secondary | ICD-10-CM | POA: Insufficient documentation

## 2023-11-10 DIAGNOSIS — Z9181 History of falling: Secondary | ICD-10-CM | POA: Insufficient documentation

## 2023-11-10 DIAGNOSIS — S42401B Unspecified fracture of lower end of right humerus, initial encounter for open fracture: Secondary | ICD-10-CM | POA: Insufficient documentation

## 2023-11-10 DIAGNOSIS — M25621 Stiffness of right elbow, not elsewhere classified: Secondary | ICD-10-CM | POA: Insufficient documentation

## 2023-11-10 DIAGNOSIS — M6281 Muscle weakness (generalized): Secondary | ICD-10-CM | POA: Diagnosis not present

## 2023-11-10 DIAGNOSIS — R29898 Other symptoms and signs involving the musculoskeletal system: Secondary | ICD-10-CM | POA: Insufficient documentation

## 2023-11-10 NOTE — Therapy (Signed)
 OUTPATIENT PHYSICAL THERAPY SHOULDER EVALUATION   Patient Name: Krystal [name] MRN: [id number] DOB:1941/04/21, 83 y.o., female Today's Date: 11/10/2023  END OF SESSION:  PT End of Session - 11/10/23 1052     Visit Number 1    Number of Visits 12    Date for PT Re-Evaluation 01/29/24    PT Start Time 1104    PT Stop Time 1142    PT Time Calculation (min) 38 min    Activity Tolerance Patient tolerated treatment well    Behavior During Therapy WFL for tasks assessed/performed             Past Medical History:  Diagnosis Date   Anemia    Asthma    Brain tumor (HCC)    1986   Breast cancer (HCC)    Right breast   Cataract    Colon polyps    COPD (chronic obstructive pulmonary disease) (HCC)    Hiatal hernia    Hyperlipidemia    Macular degeneration    Osteoporosis    Pneumonia    PVD (posterior vitreous detachment)    Right knee pain    Shingles rash    Past Surgical History:  Procedure Laterality Date   CATARACT EXTRACTION, BILATERAL     COLONOSCOPY     CORONARY PRESSURE/FFR WITH 3D MAPPING N/A 03/21/2021   Procedure: Coronary Pressure Wire/FFR w/3D Mapping;  Surgeon: Yvonne Kendall, MD;  Location: [facility];  Service: Cardiovascular;  Laterality: N/A;   CRANIECTOMY / CRANIOTOMY FOR EXCISION OF BRAIN TUMOR     Left side brain   LEFT HEART CATH AND CORONARY ANGIOGRAPHY N/A 03/21/2021   Procedure: LEFT HEART CATH AND CORONARY ANGIOGRAPHY;  Surgeon: Yvonne Kendall, MD;  Location: [facility];  Service: Cardiovascular;  Laterality: N/A;   MASTECTOMY Right    ORIF HUMERUS FRACTURE Right 08/17/2023   Procedure: OPEN REDUCTION INTERNAL FIXATION (ORIF) DISTAL HUMERUS FRACTURE;  Surgeon: Roby Lofts, MD;  Location: [facility];  Service: Orthopedics;  Laterality: Right;   PARTIAL HYSTERECTOMY     POLYPECTOMY     SKIN CANCER EXCISION Left    Under left eye   Patient Active Problem List   Diagnosis Date Noted   Open fracture of distal end of humerus  08/17/2023   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   Hypoalbuminemia due to protein-calorie malnutrition (HCC) 08/17/2023   Atrial fibrillation, chronic (HCC) 08/17/2023   Open fracture of distal end of right humerus, unspecified fracture morphology, initial encounter 08/16/2023   Chronic combined systolic and diastolic heart failure (HCC) 03/16/2023   Nonrheumatic aortic valve insufficiency 05/13/2022   COPD (chronic obstructive pulmonary disease) (HCC) 04/23/2021   Gait instability 03/28/2021   Chronic right SI joint pain 03/28/2021   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   PAF (paroxysmal atrial fibrillation) (HCC) 03/28/2021   Unstable angina (HCC)    CAD (coronary artery disease) 03/20/2021   Osteoporosis 03/24/2019   Abdominal aortic atherosclerosis (HCC) 04/10/2015   PSVT (paroxysmal supraventricular tachycardia) (HCC) 05/10/2014   Macular degeneration, age related, nonexudative 08/24/2013   History of right mastectomy 07/13/2013   Anemia, iron deficiency 06/21/2013   Allergic rhinitis 06/21/2013   Chronic low back pain 06/21/2013   Malignant neoplasm of female breast (HCC) 11/22/2007   Mixed hyperlipidemia 11/22/2007   Anxiety state 11/22/2007   Depression, recurrent (HCC) 11/22/2007   Asthma 11/22/2007   GERD 11/22/2007   Diaphragmatic hernia 08/14/2007   Hiatal hernia 08/14/2007   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   REFERRING  PROVIDER: Dettinger, Elige Radon, MD   REFERRING DIAG: Open fracture of distal end of right humerus, unspecified fracture morphology, initial encounter, Weakness of both lower extremities   THERAPY DIAG:  Stiffness of right elbow, not elsewhere classified  Pain in right elbow  History of falling  Muscle weakness (generalized)  Rationale for Evaluation and Treatment: Rehabilitation  ONSET DATE: January 2025  SUBJECTIVE:                                                                                                                                                                                       SUBJECTIVE STATEMENT: Patient reports that she fell on 08/15/23 while she was standing beside her bed. She felt like her legs just gave out on her and she broke her left arm. She has fallen twice in January, but only one of these falls resulted in an injury. She is unsure why she is falling as her legs seem to "just give out." She had surgery on 08/17/23 to repair the fracture. She had therapy at a SNF and home health after surgery. She has been doing her HEP every day.  Hand dominance: Left  PERTINENT HISTORY: Unstable angina, atrial fibrillation, asthma, COPD, osteoporosis, anxiety, depression, legally blind, and history of cancer  PAIN:  Are you having pain? Yes: NPRS scale: 5/10 Pain location: mid right radius into the hand Pain description: constant, deep, tingly, burn Aggravating factors: nothing Relieving factors: nothing  PRECAUTIONS: Fall  RED FLAGS: None   WEIGHT BEARING RESTRICTIONS:  No lifting with the right hand  FALLS:  Has patient fallen in last 6 months? Yes. Number of falls 2  LIVING ENVIRONMENT: Lives with: lives with their family Lives in: House/apartment Stairs: No Has following equipment at home: shower chair, Ramped entry, and hemi walker   OCCUPATION: Retired  PLOF: Independent  PATIENT GOALS: improved safety and reduced fall risk  NEXT MD VISIT: 02/03/24  OBJECTIVE:  Note: Objective measures were completed at Evaluation unless otherwise noted.  DIAGNOSTIC FINDINGS: 08/17/23 right elbow x-ray IMPRESSION: ORIF of distal humerus fracture with improved alignment.  PATIENT SURVEYS:  LEFS 11/80  COGNITION: Overall cognitive status: Within functional limits for tasks assessed     SENSATION: Light touch: Impaired with diminished sensation in right forearm and absent sensation in bilateral lower extremities with no dermatomal pattern    POSTURE: Forward head  UPPER EXTREMITY ROM:   Active ROM Right eval  Left eval  Shoulder flexion 95 123  Shoulder extension    Shoulder abduction    Shoulder adduction    Shoulder internal rotation    Shoulder external rotation    Elbow flexion 109 149  Elbow extension 22 0  Wrist flexion    Wrist extension    Wrist ulnar deviation    Wrist radial deviation    Wrist pronation    Wrist supination    (Blank rows = not tested)  UPPER EXTREMITY MMT:  MMT Right eval Left eval  Shoulder flexion    Shoulder extension    Shoulder abduction    Shoulder adduction    Shoulder internal rotation    Shoulder external rotation    Middle trapezius    Lower trapezius    Elbow flexion    Elbow extension    Wrist flexion    Wrist extension    Wrist ulnar deviation    Wrist radial deviation    Wrist pronation    Wrist supination    Grip strength (lbs) <5; painful  20  (Blank rows = not tested)  LOWER EXTREMITY MMT:    MMT Right eval Left eval  Hip flexion 3+/5 3+/5  Hip extension    Hip abduction    Hip adduction    Hip internal rotation    Hip external rotation    Knee flexion 4/5 4+/5  Knee extension 4+/5; "pressure" 4+/5; "pressure"  Ankle dorsiflexion 3+/5 3+/5  Ankle plantarflexion    Ankle inversion    Ankle eversion     (Blank rows = not tested)   PALPATION:  TTP: right supinator and throughout right hand   GAIT:  Device utilized: hemi walker  Level of assistance: modified independence   Gait speed: 0.18 m/s  Comments: shuffling pattern with step to pattern and poor foot clearance                                                                                                                           TREATMENT DATE:    PATIENT EDUCATION: Education details: POC, healing, prognosis, objective findings, and goals for physical therapy Person educated: Patient Education method: Explanation Education comprehension: verbalized understanding  HOME EXERCISE PROGRAM:   ASSESSMENT:  CLINICAL IMPRESSION: Patient is a 83 y.o.  female who was seen today for physical therapy evaluation and treatment for a history of falling and right elbow pain and stiffness secondary to a ORIF on 08/17/23.  She presented with moderate pain severity and irritability with palpation and right grip strength testing being the most aggravating to her familiar symptoms.  She is also at a high fall risk as evidenced by her gait speed, gait mechanics, and her history of falling.  Recommend that she continue with skilled physical therapy to address her impairments to maximize her safety and functional mobility.  OBJECTIVE IMPAIRMENTS: Abnormal gait, decreased activity tolerance, decreased balance, decreased mobility, difficulty walking, decreased ROM, decreased strength, hypomobility, impaired sensation, impaired tone, impaired UE functional use, postural dysfunction, and pain.   ACTIVITY LIMITATIONS: carrying, lifting, standing, transfers, reach over head, and locomotion level  PARTICIPATION LIMITATIONS: cleaning, laundry, shopping, and community activity  PERSONAL FACTORS: Age, Past/current experiences, Time since onset  of injury/illness/exacerbation, and 3+ comorbidities: Unstable angina, atrial fibrillation, asthma, COPD, osteoporosis, anxiety, depression, legally blind, and history of cancer  are also affecting patient's functional outcome.   REHAB POTENTIAL: Fair    CLINICAL DECISION MAKING: Unstable/unpredictable  EVALUATION COMPLEXITY: High   GOALS: Goals reviewed with patient? Yes  SHORT TERM GOALS: Target date: 12/01/23  Patient will be independent with her initial HEP.  Baseline: Goal status: INITIAL  2.  Patient will be able to demonstrate at least 120 degrees of active right elbow flexion for improved function eating.  Baseline:  Goal status: INITIAL  3.  Patient will improve his gait speed to at least 0.30 m/s for improved functional mobility.  Baseline:  Goal status: INITIAL  LONG TERM GOALS: Target date:  12/22/23  Patient will be independent with her advanced HEP.  Baseline:  Goal status: INITIAL  2.  Patient will improve her gait speed to at least 0.50 m/s for improved functional mobility. Baseline:  Goal status: INITIAL  3.  Patient will improve her right elbow extension to within 10 degrees of neutral for improved function reaching.  Baseline:  Goal status: INITIAL  4.  Patient will be able to demonstrate at least 120 degrees of active right shoulder flexion for improved function reaching overhead.  Baseline:  Goal status: INITIAL  5.  Patient will improve her right hand grip strength to at least 15 pounds for improved function grasping objects with her right hand.  Baseline:  Goal status: INITIAL  6.  Patient will improve her LEFS score to at least 22/80 for improved perceived function with her daily activities.  Baseline:  Goal status: INITIAL  PLAN:  PT FREQUENCY: 2x/week  PT DURATION: 6 weeks  PLANNED INTERVENTIONS: 81191- PT Re-evaluation, 97110-Therapeutic exercises, 97530- Therapeutic activity, 97112- Neuromuscular re-education, 97535- Self Care, 47829- Manual therapy, [phone]- Gait training, [phone]- Electrical stimulation (unattended), 97016- Vasopneumatic device, Patient/Family education, Balance training, Stair training, Joint mobilization, Cryotherapy, and Moist heat  PLAN FOR NEXT SESSION: Nustep, pulleys, manual therapy to elbow, right UE AAROM/PROM, lower extremity strengthening, and balance interventions   Granville Lewis, PT 11/10/2023, 6:43 PM

## 2023-11-11 ENCOUNTER — Other Ambulatory Visit: Payer: Self-pay | Admitting: Family Medicine

## 2023-11-11 ENCOUNTER — Telehealth: Payer: Self-pay

## 2023-11-11 NOTE — Telephone Encounter (Unsigned)
 Copied from CRM [id number]. Topic: General - Call Back - No Documentation >> Nov 11, 2023 12:34 PM Higinio Roger wrote: Reason for CRM: Patient would like a callback due to not receiving her incontinence supplies.   Callback #: [phone]

## 2023-11-12 NOTE — Telephone Encounter (Signed)
 Can put CHF or legally blind

## 2023-11-12 NOTE — Telephone Encounter (Signed)
 Informed pt that [facility] needed another Dx besides incontinence, updated OV notes faxed.

## 2023-11-12 NOTE — Telephone Encounter (Signed)
 TC to [facility] - what is primary Dx/cause for her Urinary incontinence. Please addend

## 2023-11-16 DIAGNOSIS — H353124 Nonexudative age-related macular degeneration, left eye, advanced atrophic with subfoveal involvement: Secondary | ICD-10-CM | POA: Diagnosis not present

## 2023-11-16 DIAGNOSIS — H353113 Nonexudative age-related macular degeneration, right eye, advanced atrophic without subfoveal involvement: Secondary | ICD-10-CM | POA: Diagnosis not present

## 2023-11-18 ENCOUNTER — Ambulatory Visit

## 2023-11-18 ENCOUNTER — Ambulatory Visit: Payer: Medicare HMO | Admitting: Family Medicine

## 2023-11-18 ENCOUNTER — Other Ambulatory Visit: Payer: Self-pay | Admitting: Family Medicine

## 2023-11-18 DIAGNOSIS — R29898 Other symptoms and signs involving the musculoskeletal system: Secondary | ICD-10-CM | POA: Diagnosis not present

## 2023-11-18 DIAGNOSIS — M25621 Stiffness of right elbow, not elsewhere classified: Secondary | ICD-10-CM | POA: Diagnosis not present

## 2023-11-18 DIAGNOSIS — Z9181 History of falling: Secondary | ICD-10-CM | POA: Diagnosis not present

## 2023-11-18 DIAGNOSIS — M25521 Pain in right elbow: Secondary | ICD-10-CM

## 2023-11-18 DIAGNOSIS — M6281 Muscle weakness (generalized): Secondary | ICD-10-CM

## 2023-11-18 DIAGNOSIS — R600 Localized edema: Secondary | ICD-10-CM

## 2023-11-18 DIAGNOSIS — S42401B Unspecified fracture of lower end of right humerus, initial encounter for open fracture: Secondary | ICD-10-CM | POA: Diagnosis not present

## 2023-11-18 NOTE — Therapy (Signed)
 OUTPATIENT PHYSICAL THERAPY SHOULDER TREATMENT   Patient Name: Krystal [name] MRN: [id number] DOB:[birth year], 83 y.o., female Today's Date: 11/18/2023  END OF SESSION:  PT End of Session - 11/18/23 1105     Visit Number 2    Number of Visits 12    Date for PT Re-Evaluation 01/29/24    PT Start Time 1100    PT Stop Time 1140    PT Time Calculation (min) 40 min    Activity Tolerance Patient tolerated treatment well    Behavior During Therapy WFL for tasks assessed/performed              Past Medical History:  Diagnosis Date   Anemia    Asthma    Brain tumor (HCC)    1986   Breast cancer (HCC)    Right breast   Cataract    Colon polyps    COPD (chronic obstructive pulmonary disease) (HCC)    Hiatal hernia    Hyperlipidemia    Macular degeneration    Osteoporosis    Pneumonia    PVD (posterior vitreous detachment)    Right knee pain    Shingles rash    Past Surgical History:  Procedure Laterality Date   CATARACT EXTRACTION, BILATERAL     COLONOSCOPY     CORONARY PRESSURE/FFR WITH 3D MAPPING N/A 03/21/2021   Procedure: Coronary Pressure Wire/FFR w/3D Mapping;  Surgeon: Yvonne Kendall, MD;  Location: [facility];  Service: Cardiovascular;  Laterality: N/A;   CRANIECTOMY / CRANIOTOMY FOR EXCISION OF BRAIN TUMOR     Left side brain   LEFT HEART CATH AND CORONARY ANGIOGRAPHY N/A 03/21/2021   Procedure: LEFT HEART CATH AND CORONARY ANGIOGRAPHY;  Surgeon: Yvonne Kendall, MD;  Location: [facility];  Service: Cardiovascular;  Laterality: N/A;   MASTECTOMY Right    ORIF HUMERUS FRACTURE Right 08/17/2023   Procedure: OPEN REDUCTION INTERNAL FIXATION (ORIF) DISTAL HUMERUS FRACTURE;  Surgeon: Roby Lofts, MD;  Location: [facility];  Service: Orthopedics;  Laterality: Right;   PARTIAL HYSTERECTOMY     POLYPECTOMY     SKIN CANCER EXCISION Left    Under left eye   Patient Active Problem List   Diagnosis Date Noted   Open fracture of distal end of  humerus 08/17/2023   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   Hypoalbuminemia due to protein-calorie malnutrition (HCC) 08/17/2023   Atrial fibrillation, chronic (HCC) 08/17/2023   Open fracture of distal end of right humerus, unspecified fracture morphology, initial encounter 08/16/2023   Chronic combined systolic and diastolic heart failure (HCC) 03/16/2023   Nonrheumatic aortic valve insufficiency 05/13/2022   COPD (chronic obstructive pulmonary disease) (HCC) 04/23/2021   Gait instability 03/28/2021   Chronic right SI joint pain 03/28/2021   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   PAF (paroxysmal atrial fibrillation) (HCC) 03/28/2021   Unstable angina (HCC)    CAD (coronary artery disease) 03/20/2021   Osteoporosis 03/24/2019   Abdominal aortic atherosclerosis (HCC) 04/10/2015   PSVT (paroxysmal supraventricular tachycardia) (HCC) 05/10/2014   Macular degeneration, age related, nonexudative 08/24/2013   History of right mastectomy 07/13/2013   Anemia, iron deficiency 06/21/2013   Allergic rhinitis 06/21/2013   Chronic low back pain 06/21/2013   Malignant neoplasm of female breast (HCC) 11/22/2007   Mixed hyperlipidemia 11/22/2007   Anxiety state 11/22/2007   Depression, recurrent (HCC) 11/22/2007   Asthma 11/22/2007   GERD 11/22/2007   Diaphragmatic hernia 08/14/2007   Hiatal hernia 08/14/2007   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  REFERRING PROVIDER: Dettinger, Lucio Sabin, MD   REFERRING DIAG: Open fracture of distal end of right humerus, unspecified fracture morphology, initial encounter, Weakness of both lower extremities   THERAPY DIAG:  Stiffness of right elbow, not elsewhere classified  Pain in right elbow  History of falling  Muscle weakness (generalized)  Rationale for Evaluation and Treatment: Rehabilitation  ONSET DATE: January 2025  SUBJECTIVE:                                                                                                                                                                                       SUBJECTIVE STATEMENT: Patient reports that she is hurting a little bit in her right wrist and hand.  Hand dominance: Left  PERTINENT HISTORY: Unstable angina, atrial fibrillation, asthma, COPD, osteoporosis, anxiety, depression, legally blind, and history of cancer  PAIN:  Are you having pain? Yes: NPRS scale: no pain score provided Pain location: mid right radius into the hand Pain description: constant, deep, tingly, burn Aggravating factors: nothing Relieving factors: nothing  PRECAUTIONS: Fall  RED FLAGS: None   WEIGHT BEARING RESTRICTIONS:  No lifting with the right hand  FALLS:  Has patient fallen in last 6 months? Yes. Number of falls 2  LIVING ENVIRONMENT: Lives with: lives with their family Lives in: House/apartment Stairs: No Has following equipment at home: shower chair, Ramped entry, and hemi walker   OCCUPATION: Retired  PLOF: Independent  PATIENT GOALS: improved safety and reduced fall risk  NEXT MD VISIT: 02/03/24  OBJECTIVE:  Note: Objective measures were completed at Evaluation unless otherwise noted.  DIAGNOSTIC FINDINGS: 08/17/23 right elbow x-ray IMPRESSION: ORIF of distal humerus fracture with improved alignment.  PATIENT SURVEYS:  LEFS 11/80  COGNITION: Overall cognitive status: Within functional limits for tasks assessed     SENSATION: Light touch: Impaired with diminished sensation in right forearm and absent sensation in bilateral lower extremities with no dermatomal pattern    POSTURE: Forward head  UPPER EXTREMITY ROM:   Active ROM Right eval Left eval  Shoulder flexion 95 123  Shoulder extension    Shoulder abduction    Shoulder adduction    Shoulder internal rotation    Shoulder external rotation    Elbow flexion 109 149  Elbow extension 22 0  Wrist flexion    Wrist extension    Wrist ulnar deviation    Wrist radial deviation    Wrist pronation    Wrist supination     (Blank rows = not tested)  UPPER EXTREMITY MMT:  MMT Right eval Left eval  Shoulder flexion    Shoulder extension    Shoulder abduction    Shoulder  adduction    Shoulder internal rotation    Shoulder external rotation    Middle trapezius    Lower trapezius    Elbow flexion    Elbow extension    Wrist flexion    Wrist extension    Wrist ulnar deviation    Wrist radial deviation    Wrist pronation    Wrist supination    Grip strength (lbs) <5; painful  20  (Blank rows = not tested)  LOWER EXTREMITY MMT:    MMT Right eval Left eval  Hip flexion 3+/5 3+/5  Hip extension    Hip abduction    Hip adduction    Hip internal rotation    Hip external rotation    Knee flexion 4/5 4+/5  Knee extension 4+/5; "pressure" 4+/5; "pressure"  Ankle dorsiflexion 3+/5 3+/5  Ankle plantarflexion    Ankle inversion    Ankle eversion     (Blank rows = not tested)   PALPATION:  TTP: right supinator and throughout right hand   GAIT:  Device utilized: hemi walker  Level of assistance: modified independence   Gait speed: 0.18 m/s  Comments: shuffling pattern with step to pattern and poor foot clearance                                                                                                                           TREATMENT DATE:                                    11/18/23 EXERCISE LOG  Exercise Repetitions and Resistance Comments  Nustep  L3 x 17 minutes   Seated hip ADD isometric  3 minutes w/ 5 second hold   UE ranger (seated)  4 minutes  Multidirectional   Putty squeezing  Yellow x 2 minutes   Gait training     Blank cell = exercise not performed today   PATIENT EDUCATION: Education details: HEP and benefits of exercise Person educated: Patient Education method: Explanation Education comprehension: verbalized understanding  HOME EXERCISE PROGRAM:   ASSESSMENT:  CLINICAL IMPRESSION: Patient was introduced to multiple new interventions for improved right  shoulder and elbow mobility in addition to lower extremity strengthening. She required minimal cueing with ambulation to safely utilize her assistive device to avoid reaching too far in front of her to limit her risk of falling. She reported feeling good upon the conclusion of treatment. She continues to require skilled physical therapy to address her remaining impairments to maximize her functional mobility and safety.   OBJECTIVE IMPAIRMENTS: Abnormal gait, decreased activity tolerance, decreased balance, decreased mobility, difficulty walking, decreased ROM, decreased strength, hypomobility, impaired sensation, impaired tone, impaired UE functional use, postural dysfunction, and pain.   ACTIVITY LIMITATIONS: carrying, lifting, standing, transfers, reach over head, and locomotion level  PARTICIPATION LIMITATIONS: cleaning, laundry, shopping, and community activity  PERSONAL FACTORS: Age, Past/current experiences, Time since onset  of injury/illness/exacerbation, and 3+ comorbidities: Unstable angina, atrial fibrillation, asthma, COPD, osteoporosis, anxiety, depression, legally blind, and history of cancer  are also affecting patient's functional outcome.   REHAB POTENTIAL: Fair    CLINICAL DECISION MAKING: Unstable/unpredictable  EVALUATION COMPLEXITY: High   GOALS: Goals reviewed with patient? Yes  SHORT TERM GOALS: Target date: 12/01/23  Patient will be independent with her initial HEP.  Baseline: Goal status: INITIAL  2.  Patient will be able to demonstrate at least 120 degrees of active right elbow flexion for improved function eating.  Baseline:  Goal status: INITIAL  3.  Patient will improve his gait speed to at least 0.30 m/s for improved functional mobility.  Baseline:  Goal status: INITIAL  LONG TERM GOALS: Target date: 12/22/23  Patient will be independent with her advanced HEP.  Baseline:  Goal status: INITIAL  2.  Patient will improve her gait speed to at least 0.50  m/s for improved functional mobility. Baseline:  Goal status: INITIAL  3.  Patient will improve her right elbow extension to within 10 degrees of neutral for improved function reaching.  Baseline:  Goal status: INITIAL  4.  Patient will be able to demonstrate at least 120 degrees of active right shoulder flexion for improved function reaching overhead.  Baseline:  Goal status: INITIAL  5.  Patient will improve her right hand grip strength to at least 15 pounds for improved function grasping objects with her right hand.  Baseline:  Goal status: INITIAL  6.  Patient will improve her LEFS score to at least 22/80 for improved perceived function with her daily activities.  Baseline:  Goal status: INITIAL  PLAN:  PT FREQUENCY: 2x/week  PT DURATION: 6 weeks  PLANNED INTERVENTIONS: 16109- PT Re-evaluation, 97110-Therapeutic exercises, 97530- Therapeutic activity, 97112- Neuromuscular re-education, 97535- Self Care, 60454- Manual therapy, [phone]- Gait training, [phone]- Electrical stimulation (unattended), 97016- Vasopneumatic device, Patient/Family education, Balance training, Stair training, Joint mobilization, Cryotherapy, and Moist heat  PLAN FOR NEXT SESSION: Nustep, pulleys, manual therapy to elbow, right UE AAROM/PROM, lower extremity strengthening, and balance interventions   Lane Pinon, PT 11/18/2023, 12:44 PM

## 2023-11-23 ENCOUNTER — Ambulatory Visit

## 2023-11-23 ENCOUNTER — Ambulatory Visit: Payer: Medicare HMO | Admitting: Family Medicine

## 2023-11-23 DIAGNOSIS — Z9181 History of falling: Secondary | ICD-10-CM | POA: Diagnosis not present

## 2023-11-23 DIAGNOSIS — M6281 Muscle weakness (generalized): Secondary | ICD-10-CM | POA: Diagnosis not present

## 2023-11-23 DIAGNOSIS — S42401B Unspecified fracture of lower end of right humerus, initial encounter for open fracture: Secondary | ICD-10-CM | POA: Diagnosis not present

## 2023-11-23 DIAGNOSIS — M25521 Pain in right elbow: Secondary | ICD-10-CM

## 2023-11-23 DIAGNOSIS — M25621 Stiffness of right elbow, not elsewhere classified: Secondary | ICD-10-CM | POA: Diagnosis not present

## 2023-11-23 DIAGNOSIS — R29898 Other symptoms and signs involving the musculoskeletal system: Secondary | ICD-10-CM | POA: Diagnosis not present

## 2023-11-23 NOTE — Telephone Encounter (Signed)
 Copied from CRM [id number]. Topic: General - Call Back - No Documentation >> Nov 11, 2023 12:34 PM [employer] wrote: Reason for CRM: Patient would like a callback due to not receiving her incontinence supplies.   Callback #: [phone] >> Nov 20, 2023  3:56 PM DeAngela L wrote: Patient said that the company providing the incontinence supplies need additional information call to the company and confirm the Patient has bladder problems  Call back [phone]

## 2023-11-23 NOTE — Therapy (Signed)
 OUTPATIENT PHYSICAL THERAPY SHOULDER TREATMENT   Patient Name: Krystal [name] MRN: [id number] DOB:14-Jul-1941, 83 y.o., female Today's Date: 11/23/2023  END OF SESSION:  PT End of Session - 11/23/23 1324     Visit Number 3    Number of Visits 12    Date for PT Re-Evaluation 01/29/24    PT Start Time 1300    PT Stop Time 1344    PT Time Calculation (min) 44 min    Activity Tolerance Patient tolerated treatment well    Behavior During Therapy WFL for tasks assessed/performed               Past Medical History:  Diagnosis Date   Anemia    Asthma    Brain tumor (HCC)    1986   Breast cancer (HCC)    Right breast   Cataract    Colon polyps    COPD (chronic obstructive pulmonary disease) (HCC)    Hiatal hernia    Hyperlipidemia    Macular degeneration    Osteoporosis    Pneumonia    PVD (posterior vitreous detachment)    Right knee pain    Shingles rash    Past Surgical History:  Procedure Laterality Date   CATARACT EXTRACTION, BILATERAL     COLONOSCOPY     CORONARY PRESSURE/FFR WITH 3D MAPPING N/A 03/21/2021   Procedure: Coronary Pressure Wire/FFR w/3D Mapping;  Surgeon: Sammy Crisp, MD;  Location: [facility];  Service: Cardiovascular;  Laterality: N/A;   CRANIECTOMY / CRANIOTOMY FOR EXCISION OF BRAIN TUMOR     Left side brain   LEFT HEART CATH AND CORONARY ANGIOGRAPHY N/A 03/21/2021   Procedure: LEFT HEART CATH AND CORONARY ANGIOGRAPHY;  Surgeon: Sammy Crisp, MD;  Location: [facility];  Service: Cardiovascular;  Laterality: N/A;   MASTECTOMY Right    ORIF HUMERUS FRACTURE Right 08/17/2023   Procedure: OPEN REDUCTION INTERNAL FIXATION (ORIF) DISTAL HUMERUS FRACTURE;  Surgeon: Laneta Pintos, MD;  Location: [facility];  Service: Orthopedics;  Laterality: Right;   PARTIAL HYSTERECTOMY     POLYPECTOMY     SKIN CANCER EXCISION Left    Under left eye   Patient Active Problem List   Diagnosis Date Noted   Open fracture of distal end of  humerus 08/17/2023   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   Hypoalbuminemia due to protein-calorie malnutrition (HCC) 08/17/2023   Atrial fibrillation, chronic (HCC) 08/17/2023   Open fracture of distal end of right humerus, unspecified fracture morphology, initial encounter 08/16/2023   Chronic combined systolic and diastolic heart failure (HCC) 03/16/2023   Nonrheumatic aortic valve insufficiency 05/13/2022   COPD (chronic obstructive pulmonary disease) (HCC) 04/23/2021   Gait instability 03/28/2021   Chronic right SI joint pain 03/28/2021   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   PAF (paroxysmal atrial fibrillation) (HCC) 03/28/2021   Unstable angina (HCC)    CAD (coronary artery disease) 03/20/2021   Osteoporosis 03/24/2019   Abdominal aortic atherosclerosis (HCC) 04/10/2015   PSVT (paroxysmal supraventricular tachycardia) (HCC) 05/10/2014   Macular degeneration, age related, nonexudative 08/24/2013   History of right mastectomy 07/13/2013   Anemia, iron deficiency 06/21/2013   Allergic rhinitis 06/21/2013   Chronic low back pain 06/21/2013   Malignant neoplasm of female breast (HCC) 11/22/2007   Mixed hyperlipidemia 11/22/2007   Anxiety state 11/22/2007   Depression, recurrent (HCC) 11/22/2007   Asthma 11/22/2007   GERD 11/22/2007   Diaphragmatic hernia 08/14/2007   Hiatal hernia 08/14/2007   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  REFERRING PROVIDER: Dettinger, Lucio Sabin, MD   REFERRING DIAG: Open fracture of distal end of right humerus, unspecified fracture morphology, initial encounter, Weakness of both lower extremities   THERAPY DIAG:  Stiffness of right elbow, not elsewhere classified  Pain in right elbow  History of falling  Muscle weakness (generalized)  Rationale for Evaluation and Treatment: Rehabilitation  ONSET DATE: January 2025  SUBJECTIVE:                                                                                                                                                                                       SUBJECTIVE STATEMENT: Patient reports that she feels alright today. She felt alright after her last appointment.  Hand dominance: Left  PERTINENT HISTORY: Unstable angina, atrial fibrillation, asthma, COPD, osteoporosis, anxiety, depression, legally blind, and history of cancer  PAIN:  Are you having pain? Yes: NPRS scale: no pain score provided Pain location: mid right radius into the hand Pain description: constant, deep, tingly, burn Aggravating factors: nothing Relieving factors: nothing  PRECAUTIONS: Fall  RED FLAGS: None   WEIGHT BEARING RESTRICTIONS:  No lifting with the right hand  FALLS:  Has patient fallen in last 6 months? Yes. Number of falls 2  LIVING ENVIRONMENT: Lives with: lives with their family Lives in: House/apartment Stairs: No Has following equipment at home: shower chair, Ramped entry, and hemi walker   OCCUPATION: Retired  PLOF: Independent  PATIENT GOALS: improved safety and reduced fall risk  NEXT MD VISIT: 02/03/24  OBJECTIVE:  Note: Objective measures were completed at Evaluation unless otherwise noted.  DIAGNOSTIC FINDINGS: 08/17/23 right elbow x-ray IMPRESSION: ORIF of distal humerus fracture with improved alignment.  PATIENT SURVEYS:  LEFS 11/80  COGNITION: Overall cognitive status: Within functional limits for tasks assessed     SENSATION: Light touch: Impaired with diminished sensation in right forearm and absent sensation in bilateral lower extremities with no dermatomal pattern    POSTURE: Forward head  UPPER EXTREMITY ROM:   Active ROM Right eval Left eval  Shoulder flexion 95 123  Shoulder extension    Shoulder abduction    Shoulder adduction    Shoulder internal rotation    Shoulder external rotation    Elbow flexion 109 149  Elbow extension 22 0  Wrist flexion    Wrist extension    Wrist ulnar deviation    Wrist radial deviation    Wrist pronation    Wrist  supination    (Blank rows = not tested)  UPPER EXTREMITY MMT:  MMT Right eval Left eval  Shoulder flexion    Shoulder extension    Shoulder abduction    Shoulder adduction  Shoulder internal rotation    Shoulder external rotation    Middle trapezius    Lower trapezius    Elbow flexion    Elbow extension    Wrist flexion    Wrist extension    Wrist ulnar deviation    Wrist radial deviation    Wrist pronation    Wrist supination    Grip strength (lbs) <5; painful  20  (Blank rows = not tested)  LOWER EXTREMITY MMT:    MMT Right eval Left eval  Hip flexion 3+/5 3+/5  Hip extension    Hip abduction    Hip adduction    Hip internal rotation    Hip external rotation    Knee flexion 4/5 4+/5  Knee extension 4+/5; "pressure" 4+/5; "pressure"  Ankle dorsiflexion 3+/5 3+/5  Ankle plantarflexion    Ankle inversion    Ankle eversion     (Blank rows = not tested)   PALPATION:  TTP: right supinator and throughout right hand   GAIT:  Device utilized: hemi walker  Level of assistance: modified independence   Gait speed: 0.18 m/s  Comments: shuffling pattern with step to pattern and poor foot clearance                                                                                                                           TREATMENT DATE:                                    11/23/23 EXERCISE LOG  Exercise Repetitions and Resistance Comments  Nustep  L3 x 18 minutes   Pulleys  5 minutes  Shoulder flexion  Seated hip ADD isometric  3 minutes w/ 5 second hold   LAQ 3# x 2 x 1 minute  BLE   Seated marching  3# x 2 x 1 minute BLE  Sit to stand  5 reps  With UE support from armrests   Blank cell = exercise not performed today                                    11/18/23 EXERCISE LOG  Exercise Repetitions and Resistance Comments  Nustep  L3 x 17 minutes   Seated hip ADD isometric  3 minutes w/ 5 second hold   UE ranger (seated)  4 minutes  Multidirectional   Putty  squeezing  Yellow x 2 minutes   Gait training     Blank cell = exercise not performed today   PATIENT EDUCATION: Education details: using her AD Person educated: Patient Education method: Explanation Education comprehension: verbalized understanding  HOME EXERCISE PROGRAM:   ASSESSMENT:  CLINICAL IMPRESSION: Patient was progressed with new seated upper and lower extremity interventions for improved mobility and strength. She required minimal cueing with long arc quads for proper exercise performance  to promote full knee extension. Fatigue was her primary limitation with today's interventions as evidenced by her need for brief rest breaks throughout treatment. She reported feeling tired upon the conclusion of treatment. She continues to require skilled physical therapy to address her remaining impairments to maximize her functional mobility.   OBJECTIVE IMPAIRMENTS: Abnormal gait, decreased activity tolerance, decreased balance, decreased mobility, difficulty walking, decreased ROM, decreased strength, hypomobility, impaired sensation, impaired tone, impaired UE functional use, postural dysfunction, and pain.   ACTIVITY LIMITATIONS: carrying, lifting, standing, transfers, reach over head, and locomotion level  PARTICIPATION LIMITATIONS: cleaning, laundry, shopping, and community activity  PERSONAL FACTORS: Age, Past/current experiences, Time since onset of injury/illness/exacerbation, and 3+ comorbidities: Unstable angina, atrial fibrillation, asthma, COPD, osteoporosis, anxiety, depression, legally blind, and history of cancer  are also affecting patient's functional outcome.   REHAB POTENTIAL: Fair    CLINICAL DECISION MAKING: Unstable/unpredictable  EVALUATION COMPLEXITY: High   GOALS: Goals reviewed with patient? Yes  SHORT TERM GOALS: Target date: 12/01/23  Patient will be independent with her initial HEP.  Baseline: Goal status: INITIAL  2.  Patient will be able to  demonstrate at least 120 degrees of active right elbow flexion for improved function eating.  Baseline:  Goal status: INITIAL  3.  Patient will improve his gait speed to at least 0.30 m/s for improved functional mobility.  Baseline:  Goal status: INITIAL  LONG TERM GOALS: Target date: 12/22/23  Patient will be independent with her advanced HEP.  Baseline:  Goal status: INITIAL  2.  Patient will improve her gait speed to at least 0.50 m/s for improved functional mobility. Baseline:  Goal status: INITIAL  3.  Patient will improve her right elbow extension to within 10 degrees of neutral for improved function reaching.  Baseline:  Goal status: INITIAL  4.  Patient will be able to demonstrate at least 120 degrees of active right shoulder flexion for improved function reaching overhead.  Baseline:  Goal status: INITIAL  5.  Patient will improve her right hand grip strength to at least 15 pounds for improved function grasping objects with her right hand.  Baseline:  Goal status: INITIAL  6.  Patient will improve her LEFS score to at least 22/80 for improved perceived function with her daily activities.  Baseline:  Goal status: INITIAL  PLAN:  PT FREQUENCY: 2x/week  PT DURATION: 6 weeks  PLANNED INTERVENTIONS: 97164- PT Re-evaluation, 97110-Therapeutic exercises, 97530- Therapeutic activity, 97112- Neuromuscular re-education, 97535- Self Care, 16109- Manual therapy, [phone]- Gait training, [phone]- Electrical stimulation (unattended), 97016- Vasopneumatic device, Patient/Family education, Balance training, Stair training, Joint mobilization, Cryotherapy, and Moist heat  PLAN FOR NEXT SESSION: Nustep, pulleys, manual therapy to elbow, right UE AAROM/PROM, lower extremity strengthening, and balance interventions   Lane Pinon, PT 11/23/2023, 1:48 PM

## 2023-11-24 DIAGNOSIS — G5603 Carpal tunnel syndrome, bilateral upper limbs: Secondary | ICD-10-CM | POA: Diagnosis not present

## 2023-11-24 DIAGNOSIS — G629 Polyneuropathy, unspecified: Secondary | ICD-10-CM | POA: Diagnosis not present

## 2023-11-26 ENCOUNTER — Ambulatory Visit: Admitting: Physical Therapy

## 2023-11-26 DIAGNOSIS — M6281 Muscle weakness (generalized): Secondary | ICD-10-CM | POA: Diagnosis not present

## 2023-11-26 DIAGNOSIS — M25521 Pain in right elbow: Secondary | ICD-10-CM

## 2023-11-26 DIAGNOSIS — S42401B Unspecified fracture of lower end of right humerus, initial encounter for open fracture: Secondary | ICD-10-CM | POA: Diagnosis not present

## 2023-11-26 DIAGNOSIS — M25621 Stiffness of right elbow, not elsewhere classified: Secondary | ICD-10-CM

## 2023-11-26 DIAGNOSIS — Z9181 History of falling: Secondary | ICD-10-CM

## 2023-11-26 DIAGNOSIS — R29898 Other symptoms and signs involving the musculoskeletal system: Secondary | ICD-10-CM | POA: Diagnosis not present

## 2023-11-26 NOTE — Therapy (Signed)
 OUTPATIENT PHYSICAL THERAPY SHOULDER TREATMENT   Patient Name: Krystal [name] MRN: [id number] DOB:October 15, 1940, 83 y.o., female Today's Date: 11/26/2023  END OF SESSION:  PT End of Session - 11/26/23 1251     Visit Number 4    Number of Visits 12    Date for PT Re-Evaluation 01/29/24    PT Start Time 1300    PT Stop Time 1343    PT Time Calculation (min) 43 min    Activity Tolerance Patient tolerated treatment well    Behavior During Therapy WFL for tasks assessed/performed               Past Medical History:  Diagnosis Date   Anemia    Asthma    Brain tumor (HCC)    1986   Breast cancer (HCC)    Right breast   Cataract    Colon polyps    COPD (chronic obstructive pulmonary disease) (HCC)    Hiatal hernia    Hyperlipidemia    Macular degeneration    Osteoporosis    Pneumonia    PVD (posterior vitreous detachment)    Right knee pain    Shingles rash    Past Surgical History:  Procedure Laterality Date   CATARACT EXTRACTION, BILATERAL     COLONOSCOPY     CORONARY PRESSURE/FFR WITH 3D MAPPING N/A 03/21/2021   Procedure: Coronary Pressure Wire/FFR w/3D Mapping;  Surgeon: Sammy Crisp, MD;  Location: [facility];  Service: Cardiovascular;  Laterality: N/A;   CRANIECTOMY / CRANIOTOMY FOR EXCISION OF BRAIN TUMOR     Left side brain   LEFT HEART CATH AND CORONARY ANGIOGRAPHY N/A 03/21/2021   Procedure: LEFT HEART CATH AND CORONARY ANGIOGRAPHY;  Surgeon: Sammy Crisp, MD;  Location: [facility];  Service: Cardiovascular;  Laterality: N/A;   MASTECTOMY Right    ORIF HUMERUS FRACTURE Right 08/17/2023   Procedure: OPEN REDUCTION INTERNAL FIXATION (ORIF) DISTAL HUMERUS FRACTURE;  Surgeon: Laneta Pintos, MD;  Location: [facility];  Service: Orthopedics;  Laterality: Right;   PARTIAL HYSTERECTOMY     POLYPECTOMY     SKIN CANCER EXCISION Left    Under left eye   Patient Active Problem List   Diagnosis Date Noted   Open fracture of distal end of  humerus 08/17/2023   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   Hypoalbuminemia due to protein-calorie malnutrition (HCC) 08/17/2023   Atrial fibrillation, chronic (HCC) 08/17/2023   Open fracture of distal end of right humerus, unspecified fracture morphology, initial encounter 08/16/2023   Chronic combined systolic and diastolic heart failure (HCC) 03/16/2023   Nonrheumatic aortic valve insufficiency 05/13/2022   COPD (chronic obstructive pulmonary disease) (HCC) 04/23/2021   Gait instability 03/28/2021   Chronic right SI joint pain 03/28/2021   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   PAF (paroxysmal atrial fibrillation) (HCC) 03/28/2021   Unstable angina (HCC)    CAD (coronary artery disease) 03/20/2021   Osteoporosis 03/24/2019   Abdominal aortic atherosclerosis (HCC) 04/10/2015   PSVT (paroxysmal supraventricular tachycardia) (HCC) 05/10/2014   Macular degeneration, age related, nonexudative 08/24/2013   History of right mastectomy 07/13/2013   Anemia, iron deficiency 06/21/2013   Allergic rhinitis 06/21/2013   Chronic low back pain 06/21/2013   Malignant neoplasm of female breast (HCC) 11/22/2007   Mixed hyperlipidemia 11/22/2007   Anxiety state 11/22/2007   Depression, recurrent (HCC) 11/22/2007   Asthma 11/22/2007   GERD 11/22/2007   Diaphragmatic hernia 08/14/2007   Hiatal hernia 08/14/2007   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  REFERRING PROVIDER: Dettinger, Lucio Sabin, MD   REFERRING DIAG: Open fracture of distal end of right humerus, unspecified fracture morphology, initial encounter, Weakness of both lower extremities   THERAPY DIAG:  Stiffness of right elbow, not elsewhere classified  Pain in right elbow  History of falling  Muscle weakness (generalized)  Rationale for Evaluation and Treatment: Rehabilitation  ONSET DATE: January 2025  SUBJECTIVE:                                                                                                                                                                                       SUBJECTIVE STATEMENT: Patient reports that she feels like a teenager today.  Hand dominance: Left  PERTINENT HISTORY: Unstable angina, atrial fibrillation, asthma, COPD, osteoporosis, anxiety, depression, legally blind, and history of cancer  PAIN:  Are you having pain? Yes: NPRS scale: no pain score provided Pain location: mid right radius into the hand Pain description: constant, deep, tingly, burn Aggravating factors: nothing Relieving factors: nothing  PRECAUTIONS: Fall  RED FLAGS: None   WEIGHT BEARING RESTRICTIONS:  No lifting with the right hand  FALLS:  Has patient fallen in last 6 months? Yes. Number of falls 2  LIVING ENVIRONMENT: Lives with: lives with their family Lives in: House/apartment Stairs: No Has following equipment at home: shower chair, Ramped entry, and hemi walker   OCCUPATION: Retired  PLOF: Independent  PATIENT GOALS: improved safety and reduced fall risk  NEXT MD VISIT: 02/03/24  OBJECTIVE:  Note: Objective measures were completed at Evaluation unless otherwise noted.  DIAGNOSTIC FINDINGS: 08/17/23 right elbow x-ray IMPRESSION: ORIF of distal humerus fracture with improved alignment.  PATIENT SURVEYS:  LEFS 11/80  COGNITION: Overall cognitive status: Within functional limits for tasks assessed     SENSATION: Light touch: Impaired with diminished sensation in right forearm and absent sensation in bilateral lower extremities with no dermatomal pattern    POSTURE: Forward head  UPPER EXTREMITY ROM:   Active ROM Right eval Left eval  Shoulder flexion 95 123  Shoulder extension    Shoulder abduction    Shoulder adduction    Shoulder internal rotation    Shoulder external rotation    Elbow flexion 109 149  Elbow extension 22 0  Wrist flexion    Wrist extension    Wrist ulnar deviation    Wrist radial deviation    Wrist pronation    Wrist supination    (Blank rows = not  tested)  UPPER EXTREMITY MMT:  MMT Right eval Left eval  Shoulder flexion    Shoulder extension    Shoulder abduction    Shoulder adduction    Shoulder internal  rotation    Shoulder external rotation    Middle trapezius    Lower trapezius    Elbow flexion    Elbow extension    Wrist flexion    Wrist extension    Wrist ulnar deviation    Wrist radial deviation    Wrist pronation    Wrist supination    Grip strength (lbs) <5; painful  20  (Blank rows = not tested)  LOWER EXTREMITY MMT:    MMT Right eval Left eval  Hip flexion 3+/5 3+/5  Hip extension    Hip abduction    Hip adduction    Hip internal rotation    Hip external rotation    Knee flexion 4/5 4+/5  Knee extension 4+/5; "pressure" 4+/5; "pressure"  Ankle dorsiflexion 3+/5 3+/5  Ankle plantarflexion    Ankle inversion    Ankle eversion     (Blank rows = not tested)   PALPATION:  TTP: right supinator and throughout right hand   GAIT:  Device utilized: hemi walker  Level of assistance: modified independence   Gait speed: 0.18 m/s  Comments: shuffling pattern with step to pattern and poor foot clearance                                                                                                                           TREATMENT DATE:                                    11/26/23 EXERCISE LOG  Exercise Repetitions and Resistance Comments  Nustep  L5 x 15 minutes   Pulleys 5 minutes  Flexion  Digi-flex  Attempted, but unable to be completed    Power web  Attempted, but unable to be completed   Ball squeeze 3 x 12 reps    Finger flexion/extension 2 minutes   Seated marching  3# x 2 x 12 reps each    Seated heel/toe raises 3 x 12 reps each    Seated hip ADD isometric  3 x 12 w/ 2 second hold   Seated clams Yellow t-band x 2 x 12 reps    Blank cell = exercise not performed today                                    11/23/23 EXERCISE LOG  Exercise Repetitions and Resistance Comments  Nustep  L3 x  18 minutes   Pulleys  5 minutes  Shoulder flexion  Seated hip ADD isometric  3 minutes w/ 5 second hold   LAQ 3# x 2 x 1 minute  BLE   Seated marching  3# x 2 x 1 minute BLE  Sit to stand  5 reps  With UE support from armrests   Blank cell = exercise not performed today  11/18/23 EXERCISE LOG  Exercise Repetitions and Resistance Comments  Nustep  L3 x 17 minutes   Seated hip ADD isometric  3 minutes w/ 5 second hold   UE ranger (seated)  4 minutes  Multidirectional   Putty squeezing  Yellow x 2 minutes   Gait training     Blank cell = exercise not performed today   PATIENT EDUCATION: Education details:  Person educated: Patient Education method: Explanation Education comprehension: verbalized understanding  HOME EXERCISE PROGRAM:   ASSESSMENT:  CLINICAL IMPRESSION: Patient was attempted to be introduced to multiple new interventions for improved function with grasping. She required moderate multimodal cueing with today's new interventions for proper exercise performance. She experienced the most difficulty with today's upper extremity interventions while utilizing her wrist and finger flexors and extensors. She reported feeling good upon the conclusion of treatment. She continues to require skilled physical therapy to address her remaining impairments to maximize her functional mobility.   OBJECTIVE IMPAIRMENTS: Abnormal gait, decreased activity tolerance, decreased balance, decreased mobility, difficulty walking, decreased ROM, decreased strength, hypomobility, impaired sensation, impaired tone, impaired UE functional use, postural dysfunction, and pain.   ACTIVITY LIMITATIONS: carrying, lifting, standing, transfers, reach over head, and locomotion level  PARTICIPATION LIMITATIONS: cleaning, laundry, shopping, and community activity  PERSONAL FACTORS: Age, Past/current experiences, Time since onset of injury/illness/exacerbation, and 3+  comorbidities: Unstable angina, atrial fibrillation, asthma, COPD, osteoporosis, anxiety, depression, legally blind, and history of cancer  are also affecting patient's functional outcome.   REHAB POTENTIAL: Fair    CLINICAL DECISION MAKING: Unstable/unpredictable  EVALUATION COMPLEXITY: High   GOALS: Goals reviewed with patient? Yes  SHORT TERM GOALS: Target date: 12/01/23  Patient will be independent with her initial HEP.  Baseline: Goal status: INITIAL  2.  Patient will be able to demonstrate at least 120 degrees of active right elbow flexion for improved function eating.  Baseline:  Goal status: INITIAL  3.  Patient will improve his gait speed to at least 0.30 m/s for improved functional mobility.  Baseline:  Goal status: INITIAL  LONG TERM GOALS: Target date: 12/22/23  Patient will be independent with her advanced HEP.  Baseline:  Goal status: INITIAL  2.  Patient will improve her gait speed to at least 0.50 m/s for improved functional mobility. Baseline:  Goal status: INITIAL  3.  Patient will improve her right elbow extension to within 10 degrees of neutral for improved function reaching.  Baseline:  Goal status: INITIAL  4.  Patient will be able to demonstrate at least 120 degrees of active right shoulder flexion for improved function reaching overhead.  Baseline:  Goal status: INITIAL  5.  Patient will improve her right hand grip strength to at least 15 pounds for improved function grasping objects with her right hand.  Baseline:  Goal status: INITIAL  6.  Patient will improve her LEFS score to at least 22/80 for improved perceived function with her daily activities.  Baseline:  Goal status: INITIAL  PLAN:  PT FREQUENCY: 2x/week  PT DURATION: 6 weeks  PLANNED INTERVENTIONS: 40981- PT Re-evaluation, 97110-Therapeutic exercises, 97530- Therapeutic activity, 97112- Neuromuscular re-education, 97535- Self Care, 19147- Manual therapy, [phone]- Gait training,  [phone]- Electrical stimulation (unattended), 97016- Vasopneumatic device, Patient/Family education, Balance training, Stair training, Joint mobilization, Cryotherapy, and Moist heat  PLAN FOR NEXT SESSION: Nustep, pulleys, manual therapy to elbow, right UE AAROM/PROM, lower extremity strengthening, and balance interventions   Lane Pinon, PT 11/26/2023, 1:51 PM

## 2023-11-30 ENCOUNTER — Encounter

## 2023-11-30 ENCOUNTER — Telehealth: Payer: Self-pay | Admitting: Family Medicine

## 2023-11-30 ENCOUNTER — Telehealth: Payer: Self-pay

## 2023-11-30 NOTE — Telephone Encounter (Signed)
 TC to [employer] [facility], they said they have not received my 2 previous faxes. I have also faxed this again today. Pt aware.

## 2023-11-30 NOTE — Telephone Encounter (Signed)
 Copied from CRM [id number]. Topic: General - Call Back - No Documentation >> Nov 11, 2023 12:34 PM Higinio Roger wrote: Reason for CRM: Patient would like a callback due to not receiving her incontinence supplies.   Callback #: [phone]

## 2023-11-30 NOTE — Telephone Encounter (Signed)
 Please see previous telephone encounter. Message has been sent to [facility] Nurse.

## 2023-11-30 NOTE — Telephone Encounter (Signed)
 Copied from CRM [id number]. Topic: General - Call Back - No Documentation >> Nov 11, 2023 12:34 PM Alpha [employer] wrote: Reason for CRM: Patient would like a callback due to not receiving her incontinence supplies.   Callback #: [phone] >> Nov 30, 2023  1:34 PM Juluis Ok wrote: Patient calling to check the status of her incontinence supplies. Callback # [phone] >> Nov 20, 2023  3:56 PM DeAngela L wrote: Patient said that the company providing the incontinence supplies need additional information call to the company and confirm the Patient has bladder problems  Call back [phone]

## 2023-12-01 ENCOUNTER — Ambulatory Visit

## 2023-12-01 DIAGNOSIS — M25621 Stiffness of right elbow, not elsewhere classified: Secondary | ICD-10-CM | POA: Diagnosis not present

## 2023-12-01 DIAGNOSIS — Z9181 History of falling: Secondary | ICD-10-CM | POA: Diagnosis not present

## 2023-12-01 DIAGNOSIS — M25521 Pain in right elbow: Secondary | ICD-10-CM

## 2023-12-01 DIAGNOSIS — S42401B Unspecified fracture of lower end of right humerus, initial encounter for open fracture: Secondary | ICD-10-CM | POA: Diagnosis not present

## 2023-12-01 DIAGNOSIS — M6281 Muscle weakness (generalized): Secondary | ICD-10-CM | POA: Diagnosis not present

## 2023-12-01 DIAGNOSIS — R29898 Other symptoms and signs involving the musculoskeletal system: Secondary | ICD-10-CM | POA: Diagnosis not present

## 2023-12-01 NOTE — Therapy (Signed)
 OUTPATIENT PHYSICAL THERAPY SHOULDER TREATMENT   Patient Name: Krystal [name] MRN: [id number] DOB:[birth year], 83 y.o., female Today's Date: 12/01/2023  END OF SESSION:  PT End of Session - 12/01/23 1324     Visit Number 5    Number of Visits 12    Date for PT Re-Evaluation 01/29/24    PT Start Time 1317    PT Stop Time 1400    PT Time Calculation (min) 43 min    Activity Tolerance Patient tolerated treatment well    Behavior During Therapy WFL for tasks assessed/performed                Past Medical History:  Diagnosis Date   Anemia    Asthma    Brain tumor (HCC)    1986   Breast cancer (HCC)    Right breast   Cataract    Colon polyps    COPD (chronic obstructive pulmonary disease) (HCC)    Hiatal hernia    Hyperlipidemia    Macular degeneration    Osteoporosis    Pneumonia    PVD (posterior vitreous detachment)    Right knee pain    Shingles rash    Past Surgical History:  Procedure Laterality Date   CATARACT EXTRACTION, BILATERAL     COLONOSCOPY     CORONARY PRESSURE/FFR WITH 3D MAPPING N/A 03/21/2021   Procedure: Coronary Pressure Wire/FFR w/3D Mapping;  Surgeon: Sammy Crisp, MD;  Location: [facility];  Service: Cardiovascular;  Laterality: N/A;   CRANIECTOMY / CRANIOTOMY FOR EXCISION OF BRAIN TUMOR     Left side brain   LEFT HEART CATH AND CORONARY ANGIOGRAPHY N/A 03/21/2021   Procedure: LEFT HEART CATH AND CORONARY ANGIOGRAPHY;  Surgeon: Sammy Crisp, MD;  Location: [facility];  Service: Cardiovascular;  Laterality: N/A;   MASTECTOMY Right    ORIF HUMERUS FRACTURE Right 08/17/2023   Procedure: OPEN REDUCTION INTERNAL FIXATION (ORIF) DISTAL HUMERUS FRACTURE;  Surgeon: Laneta Pintos, MD;  Location: [facility];  Service: Orthopedics;  Laterality: Right;   PARTIAL HYSTERECTOMY     POLYPECTOMY     SKIN CANCER EXCISION Left    Under left eye   Patient Active Problem List   Diagnosis Date Noted   Open fracture of distal end of  humerus 08/17/2023   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   Hypoalbuminemia due to protein-calorie malnutrition (HCC) 08/17/2023   Atrial fibrillation, chronic (HCC) 08/17/2023   Open fracture of distal end of right humerus, unspecified fracture morphology, initial encounter 08/16/2023   Chronic combined systolic and diastolic heart failure (HCC) 03/16/2023   Nonrheumatic aortic valve insufficiency 05/13/2022   COPD (chronic obstructive pulmonary disease) (HCC) 04/23/2021   Gait instability 03/28/2021   Chronic right SI joint pain 03/28/2021   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   PAF (paroxysmal atrial fibrillation) (HCC) 03/28/2021   Unstable angina (HCC)    CAD (coronary artery disease) 03/20/2021   Osteoporosis 03/24/2019   Abdominal aortic atherosclerosis (HCC) 04/10/2015   PSVT (paroxysmal supraventricular tachycardia) (HCC) 05/10/2014   Macular degeneration, age related, nonexudative 08/24/2013   History of right mastectomy 07/13/2013   Anemia, iron deficiency 06/21/2013   Allergic rhinitis 06/21/2013   Chronic low back pain 06/21/2013   Malignant neoplasm of female breast (HCC) 11/22/2007   Mixed hyperlipidemia 11/22/2007   Anxiety state 11/22/2007   Depression, recurrent (HCC) 11/22/2007   Asthma 11/22/2007   GERD 11/22/2007   Diaphragmatic hernia 08/14/2007   Hiatal hernia 08/14/2007   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  REFERRING PROVIDER: Dettinger, Lucio Sabin, MD   REFERRING DIAG: Open fracture of distal end of right humerus, unspecified fracture morphology, initial encounter, Weakness of both lower extremities   THERAPY DIAG:  Stiffness of right elbow, not elsewhere classified  Pain in right elbow  History of falling  Muscle weakness (generalized)  Rationale for Evaluation and Treatment: Rehabilitation  ONSET DATE: January 2025  SUBJECTIVE:                                                                                                                                                                                       SUBJECTIVE STATEMENT: Patient reports that she feels tired today.  Hand dominance: Left  PERTINENT HISTORY: Unstable angina, atrial fibrillation, asthma, COPD, osteoporosis, anxiety, depression, legally blind, and history of cancer  PAIN:  Are you having pain? Yes: NPRS scale: no pain score provided Pain location: mid right radius into the hand Pain description: constant, deep, tingly, burn Aggravating factors: nothing Relieving factors: nothing  PRECAUTIONS: Fall  RED FLAGS: None   WEIGHT BEARING RESTRICTIONS:  No lifting with the right hand  FALLS:  Has patient fallen in last 6 months? Yes. Number of falls 2  LIVING ENVIRONMENT: Lives with: lives with their family Lives in: House/apartment Stairs: No Has following equipment at home: shower chair, Ramped entry, and hemi walker   OCCUPATION: Retired  PLOF: Independent  PATIENT GOALS: improved safety and reduced fall risk  NEXT MD VISIT: 02/03/24  OBJECTIVE:  Note: Objective measures were completed at Evaluation unless otherwise noted.  DIAGNOSTIC FINDINGS: 08/17/23 right elbow x-ray IMPRESSION: ORIF of distal humerus fracture with improved alignment.  PATIENT SURVEYS:  LEFS 11/80  COGNITION: Overall cognitive status: Within functional limits for tasks assessed     SENSATION: Light touch: Impaired with diminished sensation in right forearm and absent sensation in bilateral lower extremities with no dermatomal pattern    POSTURE: Forward head  UPPER EXTREMITY ROM:   Active ROM Right eval Left eval  Shoulder flexion 95 123  Shoulder extension    Shoulder abduction    Shoulder adduction    Shoulder internal rotation    Shoulder external rotation    Elbow flexion 109 149  Elbow extension 22 0  Wrist flexion    Wrist extension    Wrist ulnar deviation    Wrist radial deviation    Wrist pronation    Wrist supination    (Blank rows = not  tested)  UPPER EXTREMITY MMT:  MMT Right eval Left eval  Shoulder flexion    Shoulder extension    Shoulder abduction    Shoulder adduction    Shoulder internal rotation  Shoulder external rotation    Middle trapezius    Lower trapezius    Elbow flexion    Elbow extension    Wrist flexion    Wrist extension    Wrist ulnar deviation    Wrist radial deviation    Wrist pronation    Wrist supination    Grip strength (lbs) <5; painful  20  (Blank rows = not tested)  LOWER EXTREMITY MMT:    MMT Right eval Left eval  Hip flexion 3+/5 3+/5  Hip extension    Hip abduction    Hip adduction    Hip internal rotation    Hip external rotation    Knee flexion 4/5 4+/5  Knee extension 4+/5; "pressure" 4+/5; "pressure"  Ankle dorsiflexion 3+/5 3+/5  Ankle plantarflexion    Ankle inversion    Ankle eversion     (Blank rows = not tested)   PALPATION:  TTP: right supinator and throughout right hand   GAIT:  Device utilized: hemi walker  Level of assistance: modified independence   Gait speed: 0.18 m/s  Comments: shuffling pattern with step to pattern and poor foot clearance                                                                                                                           TREATMENT DATE:                                    12/01/23 EXERCISE LOG  Exercise Repetitions and Resistance Comments  Nustep  L4-5 x 16 minutes   Pulleys  5 minutes  Flexion  Seated hip ADD isometric  3 minutes w/ 5 second hold   Isometric ball squeeze  3 minutes w/ 5 second hold  For shoulder IR   Seated heel/toe raise 2.5 minutes    LAQ 3 minutes w/ 5 second hold Alternating LE   Blank cell = exercise not performed today                                    11/26/23 EXERCISE LOG  Exercise Repetitions and Resistance Comments  Nustep  L5 x 15 minutes   Pulleys 5 minutes  Flexion  Digi-flex  Attempted, but unable to be completed    Power web  Attempted, but unable to be  completed   Ball squeeze 3 x 12 reps    Finger flexion/extension 2 minutes   Seated marching  3# x 2 x 12 reps each    Seated heel/toe raises 3 x 12 reps each    Seated hip ADD isometric  3 x 12 w/ 2 second hold   Seated clams Yellow t-band x 2 x 12 reps    Blank cell = exercise not performed today  11/23/23 EXERCISE LOG  Exercise Repetitions and Resistance Comments  Nustep  L3 x 18 minutes   Pulleys  5 minutes  Shoulder flexion  Seated hip ADD isometric  3 minutes w/ 5 second hold   LAQ 3# x 2 x 1 minute  BLE   Seated marching  3# x 2 x 1 minute BLE  Sit to stand  5 reps  With UE support from armrests   Blank cell = exercise not performed today   PATIENT EDUCATION: Education details:  Person educated: Patient Education method: Explanation Education comprehension: verbalized understanding  HOME EXERCISE PROGRAM:   ASSESSMENT:  CLINICAL IMPRESSION: Patient presented with increased fatigue. This resulted in today's treatment focusing on familiar interventions. She required minimal cueing with long arc quads for a 5 second hold to facilitate improved quadriceps engagement. She was educated on activity modification to avoid a significant increase in fatigue. She reported feeling alright upon the conclusion of treatment. She continues to require skilled physical therapy to address her remaining impairments to maximize her functional mobility.   OBJECTIVE IMPAIRMENTS: Abnormal gait, decreased activity tolerance, decreased balance, decreased mobility, difficulty walking, decreased ROM, decreased strength, hypomobility, impaired sensation, impaired tone, impaired UE functional use, postural dysfunction, and pain.   ACTIVITY LIMITATIONS: carrying, lifting, standing, transfers, reach over head, and locomotion level  PARTICIPATION LIMITATIONS: cleaning, laundry, shopping, and community activity  PERSONAL FACTORS: Age, Past/current experiences, Time since  onset of injury/illness/exacerbation, and 3+ comorbidities: Unstable angina, atrial fibrillation, asthma, COPD, osteoporosis, anxiety, depression, legally blind, and history of cancer  are also affecting patient's functional outcome.   REHAB POTENTIAL: Fair    CLINICAL DECISION MAKING: Unstable/unpredictable  EVALUATION COMPLEXITY: High   GOALS: Goals reviewed with patient? Yes  SHORT TERM GOALS: Target date: 12/01/23  Patient will be independent with her initial HEP.  Baseline: Goal status: INITIAL  2.  Patient will be able to demonstrate at least 120 degrees of active right elbow flexion for improved function eating.  Baseline:  Goal status: INITIAL  3.  Patient will improve his gait speed to at least 0.30 m/s for improved functional mobility.  Baseline:  Goal status: INITIAL  LONG TERM GOALS: Target date: 12/22/23  Patient will be independent with her advanced HEP.  Baseline:  Goal status: INITIAL  2.  Patient will improve her gait speed to at least 0.50 m/s for improved functional mobility. Baseline:  Goal status: INITIAL  3.  Patient will improve her right elbow extension to within 10 degrees of neutral for improved function reaching.  Baseline:  Goal status: INITIAL  4.  Patient will be able to demonstrate at least 120 degrees of active right shoulder flexion for improved function reaching overhead.  Baseline:  Goal status: INITIAL  5.  Patient will improve her right hand grip strength to at least 15 pounds for improved function grasping objects with her right hand.  Baseline:  Goal status: INITIAL  6.  Patient will improve her LEFS score to at least 22/80 for improved perceived function with her daily activities.  Baseline:  Goal status: INITIAL  PLAN:  PT FREQUENCY: 2x/week  PT DURATION: 6 weeks  PLANNED INTERVENTIONS: 97164- PT Re-evaluation, 97110-Therapeutic exercises, 97530- Therapeutic activity, [id number]- Neuromuscular re-education, 97535- Self Care,  97140- Manual therapy, [phone]- Gait training, [phone]- Electrical stimulation (unattended), 97016- Vasopneumatic device, Patient/Family education, Balance training, Stair training, Joint mobilization, Cryotherapy, and Moist heat  PLAN FOR NEXT SESSION: Nustep, pulleys, manual therapy to elbow, right UE AAROM/PROM, lower extremity strengthening, and balance  interventions   Lane Pinon, PT 12/01/2023, 2:12 PM

## 2023-12-02 ENCOUNTER — Telehealth: Payer: Self-pay | Admitting: Family Medicine

## 2023-12-02 NOTE — Telephone Encounter (Signed)
 Copied from CRM [id number]. Topic: General - Other >> Dec 02, 2023  2:01 PM Shamecia H wrote: Reason for CRM: Ira Mann called about the patient supplies and wanted to know if someone could help her because some of the coding is incorrect and will not be covered for other medical issues other than diabetics and or stroke, kidney failure, bladder failure, Ira Mann can be contacted at [phone] just ask for [place].

## 2023-12-03 ENCOUNTER — Ambulatory Visit: Attending: Family Medicine

## 2023-12-03 DIAGNOSIS — M25521 Pain in right elbow: Secondary | ICD-10-CM | POA: Insufficient documentation

## 2023-12-03 DIAGNOSIS — Z9181 History of falling: Secondary | ICD-10-CM | POA: Insufficient documentation

## 2023-12-03 DIAGNOSIS — M25621 Stiffness of right elbow, not elsewhere classified: Secondary | ICD-10-CM | POA: Insufficient documentation

## 2023-12-03 DIAGNOSIS — M6281 Muscle weakness (generalized): Secondary | ICD-10-CM | POA: Diagnosis not present

## 2023-12-03 NOTE — Therapy (Signed)
 OUTPATIENT PHYSICAL THERAPY SHOULDER TREATMENT   Patient Name: Krystal [name] MRN: [id number] DOB:12/31/1940, 83 y.o., female Today's Date: 12/03/2023  END OF SESSION:  PT End of Session - 12/03/23 1305     Visit Number 6    Number of Visits 12    Date for PT Re-Evaluation 01/29/24    PT Start Time 1300    PT Stop Time 1345    PT Time Calculation (min) 45 min    Activity Tolerance Patient tolerated treatment well    Behavior During Therapy WFL for tasks assessed/performed                Past Medical History:  Diagnosis Date   Anemia    Asthma    Brain tumor (HCC)    1986   Breast cancer (HCC)    Right breast   Cataract    Colon polyps    COPD (chronic obstructive pulmonary disease) (HCC)    Hiatal hernia    Hyperlipidemia    Macular degeneration    Osteoporosis    Pneumonia    PVD (posterior vitreous detachment)    Right knee pain    Shingles rash    Past Surgical History:  Procedure Laterality Date   CATARACT EXTRACTION, BILATERAL     COLONOSCOPY     CORONARY PRESSURE/FFR WITH 3D MAPPING N/A 03/21/2021   Procedure: Coronary Pressure Wire/FFR w/3D Mapping;  Surgeon: Sammy Crisp, MD;  Location: [facility];  Service: Cardiovascular;  Laterality: N/A;   CRANIECTOMY / CRANIOTOMY FOR EXCISION OF BRAIN TUMOR     Left side brain   LEFT HEART CATH AND CORONARY ANGIOGRAPHY N/A 03/21/2021   Procedure: LEFT HEART CATH AND CORONARY ANGIOGRAPHY;  Surgeon: Sammy Crisp, MD;  Location: [facility];  Service: Cardiovascular;  Laterality: N/A;   MASTECTOMY Right    ORIF HUMERUS FRACTURE Right 08/17/2023   Procedure: OPEN REDUCTION INTERNAL FIXATION (ORIF) DISTAL HUMERUS FRACTURE;  Surgeon: Laneta Pintos, MD;  Location: [facility];  Service: Orthopedics;  Laterality: Right;   PARTIAL HYSTERECTOMY     POLYPECTOMY     SKIN CANCER EXCISION Left    Under left eye   Patient Active Problem List   Diagnosis Date Noted   Open fracture of distal end of  humerus 08/17/2023   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   Hypoalbuminemia due to protein-calorie malnutrition (HCC) 08/17/2023   Atrial fibrillation, chronic (HCC) 08/17/2023   Open fracture of distal end of right humerus, unspecified fracture morphology, initial encounter 08/16/2023   Chronic combined systolic and diastolic heart failure (HCC) 03/16/2023   Nonrheumatic aortic valve insufficiency 05/13/2022   COPD (chronic obstructive pulmonary disease) (HCC) 04/23/2021   Gait instability 03/28/2021   Chronic right SI joint pain 03/28/2021   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   PAF (paroxysmal atrial fibrillation) (HCC) 03/28/2021   Unstable angina (HCC)    CAD (coronary artery disease) 03/20/2021   Osteoporosis 03/24/2019   Abdominal aortic atherosclerosis (HCC) 04/10/2015   PSVT (paroxysmal supraventricular tachycardia) (HCC) 05/10/2014   Macular degeneration, age related, nonexudative 08/24/2013   History of right mastectomy 07/13/2013   Anemia, iron deficiency 06/21/2013   Allergic rhinitis 06/21/2013   Chronic low back pain 06/21/2013   Malignant neoplasm of female breast (HCC) 11/22/2007   Mixed hyperlipidemia 11/22/2007   Anxiety state 11/22/2007   Depression, recurrent (HCC) 11/22/2007   Asthma 11/22/2007   GERD 11/22/2007   Diaphragmatic hernia 08/14/2007   Hiatal hernia 08/14/2007   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  REFERRING PROVIDER: Dettinger, Lucio Sabin, MD   REFERRING DIAG: Open fracture of distal end of right humerus, unspecified fracture morphology, initial encounter, Weakness of both lower extremities   THERAPY DIAG:  Stiffness of right elbow, not elsewhere classified  Pain in right elbow  History of falling  Muscle weakness (generalized)  Rationale for Evaluation and Treatment: Rehabilitation  ONSET DATE: January 2025  SUBJECTIVE:                                                                                                                                                                                       SUBJECTIVE STATEMENT: Pt denies any pain today, but does report feeling tired today.   Hand dominance: Left  PERTINENT HISTORY: Unstable angina, atrial fibrillation, asthma, COPD, osteoporosis, anxiety, depression, legally blind, and history of cancer  PAIN:  Are you having pain? Yes: NPRS scale: no pain score provided Pain location: mid right radius into the hand Pain description: constant, deep, tingly, burn Aggravating factors: nothing Relieving factors: nothing  PRECAUTIONS: Fall  RED FLAGS: None   WEIGHT BEARING RESTRICTIONS:  No lifting with the right hand  FALLS:  Has patient fallen in last 6 months? Yes. Number of falls 2  LIVING ENVIRONMENT: Lives with: lives with their family Lives in: House/apartment Stairs: No Has following equipment at home: shower chair, Ramped entry, and hemi walker   OCCUPATION: Retired  PLOF: Independent  PATIENT GOALS: improved safety and reduced fall risk  NEXT MD VISIT: 02/03/24  OBJECTIVE:  Note: Objective measures were completed at Evaluation unless otherwise noted.  DIAGNOSTIC FINDINGS: 08/17/23 right elbow x-ray IMPRESSION: ORIF of distal humerus fracture with improved alignment.  PATIENT SURVEYS:  LEFS 11/80  COGNITION: Overall cognitive status: Within functional limits for tasks assessed     SENSATION: Light touch: Impaired with diminished sensation in right forearm and absent sensation in bilateral lower extremities with no dermatomal pattern    POSTURE: Forward head  UPPER EXTREMITY ROM:   Active ROM Right eval Left eval  Shoulder flexion 95 123  Shoulder extension    Shoulder abduction    Shoulder adduction    Shoulder internal rotation    Shoulder external rotation    Elbow flexion 109 149  Elbow extension 22 0  Wrist flexion    Wrist extension    Wrist ulnar deviation    Wrist radial deviation    Wrist pronation    Wrist supination    (Blank rows  = not tested)  UPPER EXTREMITY MMT:  MMT Right eval Left eval  Shoulder flexion    Shoulder extension    Shoulder abduction    Shoulder adduction  Shoulder internal rotation    Shoulder external rotation    Middle trapezius    Lower trapezius    Elbow flexion    Elbow extension    Wrist flexion    Wrist extension    Wrist ulnar deviation    Wrist radial deviation    Wrist pronation    Wrist supination    Grip strength (lbs) <5; painful  20  (Blank rows = not tested)  LOWER EXTREMITY MMT:    MMT Right eval Left eval  Hip flexion 3+/5 3+/5  Hip extension    Hip abduction    Hip adduction    Hip internal rotation    Hip external rotation    Knee flexion 4/5 4+/5  Knee extension 4+/5; "pressure" 4+/5; "pressure"  Ankle dorsiflexion 3+/5 3+/5  Ankle plantarflexion    Ankle inversion    Ankle eversion     (Blank rows = not tested)   PALPATION:  TTP: right supinator and throughout right hand   GAIT:  Device utilized: hemi walker  Level of assistance: modified independence   Gait speed: 0.18 m/s  Comments: shuffling pattern with step to pattern and poor foot clearance                                                                                                                           TREATMENT DATE:   12/03/23    EXERCISE LOG  Exercise Repetitions and Resistance Comments  Nustep  L4-5 x 16 minutes   Rockerboard (seated) 5 minutes  Flexion  Ball Squeeze w/ R hand 2.5 mins   Seated Marches 2# x 20 reps bil   Seated Hip Adduction  3 mins For shoulder IR   Seated Hip Abduction Yellow x 3 mins   LAQ 2# x 20 reps bil Alternating LE   Blank cell = exercise not performed today                                    11/26/23 EXERCISE LOG  Exercise Repetitions and Resistance Comments  Nustep  L5 x 15 minutes   Pulleys 5 minutes  Flexion  Digi-flex  Attempted, but unable to be completed    Power web  Attempted, but unable to be completed   Ball squeeze 3 x 12  reps    Finger flexion/extension 2 minutes   Seated marching  3# x 2 x 12 reps each    Seated heel/toe raises 3 x 12 reps each    Seated hip ADD isometric  3 x 12 w/ 2 second hold   Seated clams Yellow t-band x 2 x 12 reps    Blank cell = exercise not performed today  11/23/23 EXERCISE LOG  Exercise Repetitions and Resistance Comments  Nustep  L3 x 18 minutes   Pulleys  5 minutes  Shoulder flexion  Seated hip ADD isometric  3 minutes w/ 5 second hold   LAQ 3# x 2 x 1 minute  BLE   Seated marching  3# x 2 x 1 minute BLE  Sit to stand  5 reps  With UE support from armrests   Blank cell = exercise not performed today   PATIENT EDUCATION: Education details:  Person educated: Patient Education method: Explanation Education comprehension: verbalized understanding  HOME EXERCISE PROGRAM:   ASSESSMENT:  CLINICAL IMPRESSION: Pt arrives for today's treatment session denying any pain, but reports feeling tired today.  Pt requiring min cues for all newly added exercises today.  Pt given seated rest breaks for fatigue as needed.  Pt reports feeling better since beginning therapy, but her right arm is still "giving her trouble."  Pt denied any pain at completion of today's treatment session.  OBJECTIVE IMPAIRMENTS: Abnormal gait, decreased activity tolerance, decreased balance, decreased mobility, difficulty walking, decreased ROM, decreased strength, hypomobility, impaired sensation, impaired tone, impaired UE functional use, postural dysfunction, and pain.   ACTIVITY LIMITATIONS: carrying, lifting, standing, transfers, reach over head, and locomotion level  PARTICIPATION LIMITATIONS: cleaning, laundry, shopping, and community activity  PERSONAL FACTORS: Age, Past/current experiences, Time since onset of injury/illness/exacerbation, and 3+ comorbidities: Unstable angina, atrial fibrillation, asthma, COPD, osteoporosis, anxiety, depression, legally blind, and  history of cancer  are also affecting patient's functional outcome.   REHAB POTENTIAL: Fair    CLINICAL DECISION MAKING: Unstable/unpredictable  EVALUATION COMPLEXITY: High   GOALS: Goals reviewed with patient? Yes  SHORT TERM GOALS: Target date: 12/01/23  Patient will be independent with her initial HEP.  Baseline: Goal status: INITIAL  2.  Patient will be able to demonstrate at least 120 degrees of active right elbow flexion for improved function eating.  Baseline:  Goal status: INITIAL  3.  Patient will improve his gait speed to at least 0.30 m/s for improved functional mobility.  Baseline:  Goal status: INITIAL  LONG TERM GOALS: Target date: 12/22/23  Patient will be independent with her advanced HEP.  Baseline:  Goal status: INITIAL  2.  Patient will improve her gait speed to at least 0.50 m/s for improved functional mobility. Baseline:  Goal status: INITIAL  3.  Patient will improve her right elbow extension to within 10 degrees of neutral for improved function reaching.  Baseline:  Goal status: INITIAL  4.  Patient will be able to demonstrate at least 120 degrees of active right shoulder flexion for improved function reaching overhead.  Baseline:  Goal status: INITIAL  5.  Patient will improve her right hand grip strength to at least 15 pounds for improved function grasping objects with her right hand.  Baseline:  Goal status: INITIAL  6.  Patient will improve her LEFS score to at least 22/80 for improved perceived function with her daily activities.  Baseline:  Goal status: INITIAL  PLAN:  PT FREQUENCY: 2x/week  PT DURATION: 6 weeks  PLANNED INTERVENTIONS: 16109- PT Re-evaluation, 97110-Therapeutic exercises, 97530- Therapeutic activity, 97112- Neuromuscular re-education, 97535- Self Care, 60454- Manual therapy, [phone]- Gait training, [phone]- Electrical stimulation (unattended), 97016- Vasopneumatic device, Patient/Family education, Balance training,  Stair training, Joint mobilization, Cryotherapy, and Moist heat  PLAN FOR NEXT SESSION: Nustep, pulleys, manual therapy to elbow, right UE AAROM/PROM, lower extremity strengthening, and balance interventions   Deryl Flora, PTA 12/03/2023, 2:46  PM

## 2023-12-03 NOTE — Telephone Encounter (Signed)
 They will pay for DM, kidney failure, DDD or Osteoporosis if back, spine or legs Does she have any of these Could also send to sister company [employer] for a discount rate [phone]

## 2023-12-03 NOTE — Telephone Encounter (Signed)
 Krystal [name] can you help me with this, I put urinary incontinence as the diagnosis and I said in my note that it was secondary to her having congestive heart failure and on diuretics as well, are they saying that they will not cover it with a diagnosis of urinary incontinence?

## 2023-12-07 ENCOUNTER — Other Ambulatory Visit: Payer: Self-pay | Admitting: Family Medicine

## 2023-12-07 DIAGNOSIS — R600 Localized edema: Secondary | ICD-10-CM

## 2023-12-07 NOTE — Telephone Encounter (Signed)
  The original prescription was discontinued on 08/21/2023 by Abbe Abate, MD for the following reason: Stop Taking at Discharge. Renewing this prescription may not be appropriate.

## 2023-12-08 ENCOUNTER — Ambulatory Visit

## 2023-12-08 DIAGNOSIS — Z9181 History of falling: Secondary | ICD-10-CM | POA: Diagnosis not present

## 2023-12-08 DIAGNOSIS — M25521 Pain in right elbow: Secondary | ICD-10-CM | POA: Diagnosis not present

## 2023-12-08 DIAGNOSIS — M6281 Muscle weakness (generalized): Secondary | ICD-10-CM | POA: Diagnosis not present

## 2023-12-08 DIAGNOSIS — M25621 Stiffness of right elbow, not elsewhere classified: Secondary | ICD-10-CM | POA: Diagnosis not present

## 2023-12-08 NOTE — Therapy (Signed)
 OUTPATIENT PHYSICAL THERAPY SHOULDER TREATMENT   Patient Name: Krystal [name] MRN: [id number] DOB:1940/12/08, 83 y.o., female Today's Date: 12/08/2023  END OF SESSION:  PT End of Session - 12/08/23 1435     Visit Number 7    Number of Visits 12    Date for PT Re-Evaluation 01/29/24    PT Start Time 1430    PT Stop Time 1515    PT Time Calculation (min) 45 min    Activity Tolerance Patient tolerated treatment well    Behavior During Therapy WFL for tasks assessed/performed                Past Medical History:  Diagnosis Date   Anemia    Asthma    Brain tumor (HCC)    1986   Breast cancer (HCC)    Right breast   Cataract    Colon polyps    COPD (chronic obstructive pulmonary disease) (HCC)    Hiatal hernia    Hyperlipidemia    Macular degeneration    Osteoporosis    Pneumonia    PVD (posterior vitreous detachment)    Right knee pain    Shingles rash    Past Surgical History:  Procedure Laterality Date   CATARACT EXTRACTION, BILATERAL     COLONOSCOPY     CORONARY PRESSURE/FFR WITH 3D MAPPING N/A 03/21/2021   Procedure: Coronary Pressure Wire/FFR w/3D Mapping;  Surgeon: Sammy Crisp, MD;  Location: [facility];  Service: Cardiovascular;  Laterality: N/A;   CRANIECTOMY / CRANIOTOMY FOR EXCISION OF BRAIN TUMOR     Left side brain   LEFT HEART CATH AND CORONARY ANGIOGRAPHY N/A 03/21/2021   Procedure: LEFT HEART CATH AND CORONARY ANGIOGRAPHY;  Surgeon: Sammy Crisp, MD;  Location: [facility];  Service: Cardiovascular;  Laterality: N/A;   MASTECTOMY Right    ORIF HUMERUS FRACTURE Right 08/17/2023   Procedure: OPEN REDUCTION INTERNAL FIXATION (ORIF) DISTAL HUMERUS FRACTURE;  Surgeon: Laneta Pintos, MD;  Location: [facility];  Service: Orthopedics;  Laterality: Right;   PARTIAL HYSTERECTOMY     POLYPECTOMY     SKIN CANCER EXCISION Left    Under left eye   Patient Active Problem List   Diagnosis Date Noted   Open fracture of distal end of  humerus 08/17/2023   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   Hypoalbuminemia due to protein-calorie malnutrition (HCC) 08/17/2023   Atrial fibrillation, chronic (HCC) 08/17/2023   Open fracture of distal end of right humerus, unspecified fracture morphology, initial encounter 08/16/2023   Chronic combined systolic and diastolic heart failure (HCC) 03/16/2023   Nonrheumatic aortic valve insufficiency 05/13/2022   COPD (chronic obstructive pulmonary disease) (HCC) 04/23/2021   Gait instability 03/28/2021   Chronic right SI joint pain 03/28/2021   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   PAF (paroxysmal atrial fibrillation) (HCC) 03/28/2021   Unstable angina (HCC)    CAD (coronary artery disease) 03/20/2021   Osteoporosis 03/24/2019   Abdominal aortic atherosclerosis (HCC) 04/10/2015   PSVT (paroxysmal supraventricular tachycardia) (HCC) 05/10/2014   Macular degeneration, age related, nonexudative 08/24/2013   History of right mastectomy 07/13/2013   Anemia, iron deficiency 06/21/2013   Allergic rhinitis 06/21/2013   Chronic low back pain 06/21/2013   Malignant neoplasm of female breast (HCC) 11/22/2007   Mixed hyperlipidemia 11/22/2007   Anxiety state 11/22/2007   Depression, recurrent (HCC) 11/22/2007   Asthma 11/22/2007   GERD 11/22/2007   Diaphragmatic hernia 08/14/2007   Hiatal hernia 08/14/2007   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  REFERRING PROVIDER: Dettinger, Lucio Sabin, MD   REFERRING DIAG: Open fracture of distal end of right humerus, unspecified fracture morphology, initial encounter, Weakness of both lower extremities   THERAPY DIAG:  Stiffness of right elbow, not elsewhere classified  Pain in right elbow  History of falling  Muscle weakness (generalized)  Rationale for Evaluation and Treatment: Rehabilitation  ONSET DATE: January 2025  SUBJECTIVE:                                                                                                                                                                                       SUBJECTIVE STATEMENT: Pt reports minimal right arm pain today.  Hand dominance: Left  PERTINENT HISTORY: Unstable angina, atrial fibrillation, asthma, COPD, osteoporosis, anxiety, depression, legally blind, and history of cancer  PAIN:  Are you having pain? Yes: NPRS scale: no pain score provided Pain location: mid right radius into the hand Pain description: constant, deep, tingly, burn Aggravating factors: nothing Relieving factors: nothing  PRECAUTIONS: Fall  RED FLAGS: None   WEIGHT BEARING RESTRICTIONS:  No lifting with the right hand  FALLS:  Has patient fallen in last 6 months? Yes. Number of falls 2  LIVING ENVIRONMENT: Lives with: lives with their family Lives in: House/apartment Stairs: No Has following equipment at home: shower chair, Ramped entry, and hemi walker   OCCUPATION: Retired  PLOF: Independent  PATIENT GOALS: improved safety and reduced fall risk  NEXT MD VISIT: 02/03/24  OBJECTIVE:  Note: Objective measures were completed at Evaluation unless otherwise noted.  DIAGNOSTIC FINDINGS: 08/17/23 right elbow x-ray IMPRESSION: ORIF of distal humerus fracture with improved alignment.  PATIENT SURVEYS:  LEFS 11/80  COGNITION: Overall cognitive status: Within functional limits for tasks assessed     SENSATION: Light touch: Impaired with diminished sensation in right forearm and absent sensation in bilateral lower extremities with no dermatomal pattern    POSTURE: Forward head  UPPER EXTREMITY ROM:   Active ROM Right eval Left eval  Shoulder flexion 95 123  Shoulder extension    Shoulder abduction    Shoulder adduction    Shoulder internal rotation    Shoulder external rotation    Elbow flexion 109 149  Elbow extension 22 0  Wrist flexion    Wrist extension    Wrist ulnar deviation    Wrist radial deviation    Wrist pronation    Wrist supination    (Blank rows = not tested)  UPPER  EXTREMITY MMT:  MMT Right eval Left eval  Shoulder flexion    Shoulder extension    Shoulder abduction    Shoulder adduction    Shoulder internal rotation  Shoulder external rotation    Middle trapezius    Lower trapezius    Elbow flexion    Elbow extension    Wrist flexion    Wrist extension    Wrist ulnar deviation    Wrist radial deviation    Wrist pronation    Wrist supination    Grip strength (lbs) <5; painful  20  (Blank rows = not tested)  LOWER EXTREMITY MMT:    MMT Right eval Left eval  Hip flexion 3+/5 3+/5  Hip extension    Hip abduction    Hip adduction    Hip internal rotation    Hip external rotation    Knee flexion 4/5 4+/5  Knee extension 4+/5; "pressure" 4+/5; "pressure"  Ankle dorsiflexion 3+/5 3+/5  Ankle plantarflexion    Ankle inversion    Ankle eversion     (Blank rows = not tested)   PALPATION:  TTP: right supinator and throughout right hand   GAIT:  Device utilized: hemi walker  Level of assistance: modified independence   Gait speed: 0.18 m/s  Comments: shuffling pattern with step to pattern and poor foot clearance                                                                                                                           TREATMENT DATE:   12/08/23    EXERCISE LOG  Exercise Repetitions and Resistance Comments  Nustep  L4-5 x 20 minutes   Rockerboard (seated) 5 minutes  Flexion  Ball Squeeze w/ R hand    Seated Marches 2# x 25 reps bil   Seated Hip Adduction  3.5 mins For shoulder IR   Seated Hip Abduction Yellow x 3.5 mins   LAQ 2# x 25 reps bil Alternating LE  Seated Ham Curls Yellow x 20 reps bil    Blank cell = exercise not performed today                                    11/26/23 EXERCISE LOG  Exercise Repetitions and Resistance Comments  Nustep  L5 x 15 minutes   Pulleys 5 minutes  Flexion  Digi-flex  Attempted, but unable to be completed    Power web  Attempted, but unable to be completed   Ball  squeeze 3 x 12 reps    Finger flexion/extension 2 minutes   Seated marching  3# x 2 x 12 reps each    Seated heel/toe raises 3 x 12 reps each    Seated hip ADD isometric  3 x 12 w/ 2 second hold   Seated clams Yellow t-band x 2 x 12 reps    Blank cell = exercise not performed today  11/23/23 EXERCISE LOG  Exercise Repetitions and Resistance Comments  Nustep  L3 x 18 minutes   Pulleys  5 minutes  Shoulder flexion  Seated hip ADD isometric  3 minutes w/ 5 second hold   LAQ 3# x 2 x 1 minute  BLE   Seated marching  3# x 2 x 1 minute BLE  Sit to stand  5 reps  With UE support from armrests   Blank cell = exercise not performed today   PATIENT EDUCATION: Education details:  Person educated: Patient Education method: Explanation Education comprehension: verbalized understanding  HOME EXERCISE PROGRAM:   ASSESSMENT:  CLINICAL IMPRESSION: Pt arrives for today's treatment session minimal right arm pain.  Pt reports feeling good after last treatment session.  Pt able to tolerate increased time and reps with all seated exercises today.  Pt introduced to seated ham curls today with min cues for eccentric control.  Pt denied any pain at completion of today's treatment session.    OBJECTIVE IMPAIRMENTS: Abnormal gait, decreased activity tolerance, decreased balance, decreased mobility, difficulty walking, decreased ROM, decreased strength, hypomobility, impaired sensation, impaired tone, impaired UE functional use, postural dysfunction, and pain.   ACTIVITY LIMITATIONS: carrying, lifting, standing, transfers, reach over head, and locomotion level  PARTICIPATION LIMITATIONS: cleaning, laundry, shopping, and community activity  PERSONAL FACTORS: Age, Past/current experiences, Time since onset of injury/illness/exacerbation, and 3+ comorbidities: Unstable angina, atrial fibrillation, asthma, COPD, osteoporosis, anxiety, depression, legally blind, and history of  cancer  are also affecting patient's functional outcome.   REHAB POTENTIAL: Fair    CLINICAL DECISION MAKING: Unstable/unpredictable  EVALUATION COMPLEXITY: High   GOALS: Goals reviewed with patient? Yes  SHORT TERM GOALS: Target date: 12/01/23  Patient will be independent with her initial HEP.  Baseline: Goal status: INITIAL  2.  Patient will be able to demonstrate at least 120 degrees of active right elbow flexion for improved function eating.  Baseline:  Goal status: INITIAL  3.  Patient will improve his gait speed to at least 0.30 m/s for improved functional mobility.  Baseline:  Goal status: INITIAL  LONG TERM GOALS: Target date: 12/22/23  Patient will be independent with her advanced HEP.  Baseline:  Goal status: INITIAL  2.  Patient will improve her gait speed to at least 0.50 m/s for improved functional mobility. Baseline:  Goal status: INITIAL  3.  Patient will improve her right elbow extension to within 10 degrees of neutral for improved function reaching.  Baseline:  Goal status: INITIAL  4.  Patient will be able to demonstrate at least 120 degrees of active right shoulder flexion for improved function reaching overhead.  Baseline:  Goal status: INITIAL  5.  Patient will improve her right hand grip strength to at least 15 pounds for improved function grasping objects with her right hand.  Baseline:  Goal status: INITIAL  6.  Patient will improve her LEFS score to at least 22/80 for improved perceived function with her daily activities.  Baseline:  Goal status: INITIAL  PLAN:  PT FREQUENCY: 2x/week  PT DURATION: 6 weeks  PLANNED INTERVENTIONS: 16109- PT Re-evaluation, 97110-Therapeutic exercises, 97530- Therapeutic activity, 97112- Neuromuscular re-education, 97535- Self Care, 60454- Manual therapy, [phone]- Gait training, [phone]- Electrical stimulation (unattended), 97016- Vasopneumatic device, Patient/Family education, Balance training, Stair training,  Joint mobilization, Cryotherapy, and Moist heat  PLAN FOR NEXT SESSION: Nustep, pulleys, manual therapy to elbow, right UE AAROM/PROM, lower extremity strengthening, and balance interventions   Deryl Flora, PTA 12/08/2023, 4:26 PM

## 2023-12-09 ENCOUNTER — Ambulatory Visit

## 2023-12-09 DIAGNOSIS — M25521 Pain in right elbow: Secondary | ICD-10-CM | POA: Diagnosis not present

## 2023-12-09 DIAGNOSIS — Z9181 History of falling: Secondary | ICD-10-CM

## 2023-12-09 DIAGNOSIS — M6281 Muscle weakness (generalized): Secondary | ICD-10-CM

## 2023-12-09 DIAGNOSIS — M25621 Stiffness of right elbow, not elsewhere classified: Secondary | ICD-10-CM

## 2023-12-09 NOTE — Therapy (Signed)
 OUTPATIENT PHYSICAL THERAPY SHOULDER TREATMENT   Patient Name: Krystal [name] MRN: [id number] DOB:March 24, 1941, 83 y.o., female Today's Date: 12/09/2023  END OF SESSION:  PT End of Session - 12/09/23 1348     Visit Number 8    Number of Visits 12    Date for PT Re-Evaluation 01/29/24    PT Start Time 1346    PT Stop Time 1431    PT Time Calculation (min) 45 min    Activity Tolerance Patient tolerated treatment well    Behavior During Therapy WFL for tasks assessed/performed                Past Medical History:  Diagnosis Date   Anemia    Asthma    Brain tumor (HCC)    1986   Breast cancer (HCC)    Right breast   Cataract    Colon polyps    COPD (chronic obstructive pulmonary disease) (HCC)    Hiatal hernia    Hyperlipidemia    Macular degeneration    Osteoporosis    Pneumonia    PVD (posterior vitreous detachment)    Right knee pain    Shingles rash    Past Surgical History:  Procedure Laterality Date   CATARACT EXTRACTION, BILATERAL     COLONOSCOPY     CORONARY PRESSURE/FFR WITH 3D MAPPING N/A 03/21/2021   Procedure: Coronary Pressure Wire/FFR w/3D Mapping;  Surgeon: Sammy Crisp, MD;  Location: [facility];  Service: Cardiovascular;  Laterality: N/A;   CRANIECTOMY / CRANIOTOMY FOR EXCISION OF BRAIN TUMOR     Left side brain   LEFT HEART CATH AND CORONARY ANGIOGRAPHY N/A 03/21/2021   Procedure: LEFT HEART CATH AND CORONARY ANGIOGRAPHY;  Surgeon: Sammy Crisp, MD;  Location: [facility];  Service: Cardiovascular;  Laterality: N/A;   MASTECTOMY Right    ORIF HUMERUS FRACTURE Right 08/17/2023   Procedure: OPEN REDUCTION INTERNAL FIXATION (ORIF) DISTAL HUMERUS FRACTURE;  Surgeon: Laneta Pintos, MD;  Location: [facility];  Service: Orthopedics;  Laterality: Right;   PARTIAL HYSTERECTOMY     POLYPECTOMY     SKIN CANCER EXCISION Left    Under left eye   Patient Active Problem List   Diagnosis Date Noted   Open fracture of distal end of  humerus 08/17/2023   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   Hypoalbuminemia due to protein-calorie malnutrition (HCC) 08/17/2023   Atrial fibrillation, chronic (HCC) 08/17/2023   Open fracture of distal end of right humerus, unspecified fracture morphology, initial encounter 08/16/2023   Chronic combined systolic and diastolic heart failure (HCC) 03/16/2023   Nonrheumatic aortic valve insufficiency 05/13/2022   COPD (chronic obstructive pulmonary disease) (HCC) 04/23/2021   Gait instability 03/28/2021   Chronic right SI joint pain 03/28/2021   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   PAF (paroxysmal atrial fibrillation) (HCC) 03/28/2021   Unstable angina (HCC)    CAD (coronary artery disease) 03/20/2021   Osteoporosis 03/24/2019   Abdominal aortic atherosclerosis (HCC) 04/10/2015   PSVT (paroxysmal supraventricular tachycardia) (HCC) 05/10/2014   Macular degeneration, age related, nonexudative 08/24/2013   History of right mastectomy 07/13/2013   Anemia, iron deficiency 06/21/2013   Allergic rhinitis 06/21/2013   Chronic low back pain 06/21/2013   Malignant neoplasm of female breast (HCC) 11/22/2007   Mixed hyperlipidemia 11/22/2007   Anxiety state 11/22/2007   Depression, recurrent (HCC) 11/22/2007   Asthma 11/22/2007   GERD 11/22/2007   Diaphragmatic hernia 08/14/2007   Hiatal hernia 08/14/2007   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  REFERRING PROVIDER: Dettinger, Lucio Sabin, MD   REFERRING DIAG: Open fracture of distal end of right humerus, unspecified fracture morphology, initial encounter, Weakness of both lower extremities   THERAPY DIAG:  Stiffness of right elbow, not elsewhere classified  Pain in right elbow  History of falling  Muscle weakness (generalized)  Rationale for Evaluation and Treatment: Rehabilitation  ONSET DATE: January 2025  SUBJECTIVE:                                                                                                                                                                                       SUBJECTIVE STATEMENT: Pt reports minimal right arm pain today.  Hand dominance: Left  PERTINENT HISTORY: Unstable angina, atrial fibrillation, asthma, COPD, osteoporosis, anxiety, depression, legally blind, and history of cancer  PAIN:  Are you having pain? Yes: NPRS scale: no pain score provided Pain location: mid right radius into the hand Pain description: constant, deep, tingly, burn Aggravating factors: nothing Relieving factors: nothing  PRECAUTIONS: Fall  RED FLAGS: None   WEIGHT BEARING RESTRICTIONS:  No lifting with the right hand  FALLS:  Has patient fallen in last 6 months? Yes. Number of falls 2  LIVING ENVIRONMENT: Lives with: lives with their family Lives in: House/apartment Stairs: No Has following equipment at home: shower chair, Ramped entry, and hemi walker   OCCUPATION: Retired  PLOF: Independent  PATIENT GOALS: improved safety and reduced fall risk  NEXT MD VISIT: 02/03/24  OBJECTIVE:  Note: Objective measures were completed at Evaluation unless otherwise noted.  DIAGNOSTIC FINDINGS: 08/17/23 right elbow x-ray IMPRESSION: ORIF of distal humerus fracture with improved alignment.  PATIENT SURVEYS:  LEFS 11/80  COGNITION: Overall cognitive status: Within functional limits for tasks assessed     SENSATION: Light touch: Impaired with diminished sensation in right forearm and absent sensation in bilateral lower extremities with no dermatomal pattern    POSTURE: Forward head  UPPER EXTREMITY ROM:   Active ROM Right eval Left eval  Shoulder flexion 95 123  Shoulder extension    Shoulder abduction    Shoulder adduction    Shoulder internal rotation    Shoulder external rotation    Elbow flexion 109 149  Elbow extension 22 0  Wrist flexion    Wrist extension    Wrist ulnar deviation    Wrist radial deviation    Wrist pronation    Wrist supination    (Blank rows = not tested)  UPPER  EXTREMITY MMT:  MMT Right eval Left eval  Shoulder flexion    Shoulder extension    Shoulder abduction    Shoulder adduction    Shoulder internal rotation  Shoulder external rotation    Middle trapezius    Lower trapezius    Elbow flexion    Elbow extension    Wrist flexion    Wrist extension    Wrist ulnar deviation    Wrist radial deviation    Wrist pronation    Wrist supination    Grip strength (lbs) <5; painful  20  (Blank rows = not tested)  LOWER EXTREMITY MMT:    MMT Right eval Left eval  Hip flexion 3+/5 3+/5  Hip extension    Hip abduction    Hip adduction    Hip internal rotation    Hip external rotation    Knee flexion 4/5 4+/5  Knee extension 4+/5; "pressure" 4+/5; "pressure"  Ankle dorsiflexion 3+/5 3+/5  Ankle plantarflexion    Ankle inversion    Ankle eversion     (Blank rows = not tested)   PALPATION:  TTP: right supinator and throughout right hand   GAIT:  Device utilized: hemi walker  Level of assistance: modified independence   Gait speed: 0.18 m/s  Comments: shuffling pattern with step to pattern and poor foot clearance                                                                                                                           TREATMENT DATE:   12/09/23    EXERCISE LOG  Exercise Repetitions and Resistance Comments  Nustep  L4 x 20 minutes   Rockerboard (standing) 3 minutes  Flexion  Toe Taps 4" box 2 sets of 10 reps   Ball Squeeze w/ R hand    Seated Marches 2# 2 sets of 15 reps   Seated Hip Adduction  3.5 mins For shoulder IR   Seated Hip Abduction Yellow x 3.5 mins   LAQ 2# 2 sets of 15 reps Alternating LE  Seated Ham Curls Yellow x 25 reps bil   Gait  To car from chair    Blank cell = exercise not performed today                                    11/26/23 EXERCISE LOG  Exercise Repetitions and Resistance Comments  Nustep  L5 x 15 minutes   Pulleys 5 minutes  Flexion  Digi-flex  Attempted, but unable to be  completed    Power web  Attempted, but unable to be completed   Ball squeeze 3 x 12 reps    Finger flexion/extension 2 minutes   Seated marching  3# x 2 x 12 reps each    Seated heel/toe raises 3 x 12 reps each    Seated hip ADD isometric  3 x 12 w/ 2 second hold   Seated clams Yellow t-band x 2 x 12 reps    Blank cell = exercise not performed today  11/23/23 EXERCISE LOG  Exercise Repetitions and Resistance Comments  Nustep  L3 x 18 minutes   Pulleys  5 minutes  Shoulder flexion  Seated hip ADD isometric  3 minutes w/ 5 second hold   LAQ 3# x 2 x 1 minute  BLE   Seated marching  3# x 2 x 1 minute BLE  Sit to stand  5 reps  With UE support from armrests   Blank cell = exercise not performed today   PATIENT EDUCATION: Education details:  Person educated: Patient Education method: Explanation Education comprehension: verbalized understanding  HOME EXERCISE PROGRAM:   ASSESSMENT:  CLINICAL IMPRESSION: Pt arrives for today's treatment session reporting minimal right arm and hand pain.  Pt able to demonstrate -1 degrees of right elbow extension and 135 degrees or right elbow flexion, meeting both of her elbow ROM goals.  Pt able to demonstreat 103 degrees of active right shoulder, making good progress towards her shoulder ROM goal.  Pt's gait speed measured at .16 m/s which is comparable to eval.  Pt able to tolerate increased reps with all previously performed exercises.  Pt denied any change in arm pain at completion of today's treatment session.   OBJECTIVE IMPAIRMENTS: Abnormal gait, decreased activity tolerance, decreased balance, decreased mobility, difficulty walking, decreased ROM, decreased strength, hypomobility, impaired sensation, impaired tone, impaired UE functional use, postural dysfunction, and pain.   ACTIVITY LIMITATIONS: carrying, lifting, standing, transfers, reach over head, and locomotion level  PARTICIPATION LIMITATIONS:  cleaning, laundry, shopping, and community activity  PERSONAL FACTORS: Age, Past/current experiences, Time since onset of injury/illness/exacerbation, and 3+ comorbidities: Unstable angina, atrial fibrillation, asthma, COPD, osteoporosis, anxiety, depression, legally blind, and history of cancer  are also affecting patient's functional outcome.   REHAB POTENTIAL: Fair    CLINICAL DECISION MAKING: Unstable/unpredictable  EVALUATION COMPLEXITY: High   GOALS: Goals reviewed with patient? Yes  SHORT TERM GOALS: Target date: 12/01/23  Patient will be independent with her initial HEP.  Baseline: Goal status: MET  2.  Patient will be able to demonstrate at least 120 degrees of active right elbow flexion for improved function eating.  Baseline:5/7: 135 degrees Goal status: MET  3.  Patient will improve his gait speed to at least 0.30 m/s for improved functional mobility.  Baseline: 5/7: .16 m/s Goal status: IN PROGRESS  LONG TERM GOALS: Target date: 12/22/23  Patient will be independent with her advanced HEP.  Baseline:  Goal status: IN PROGRESS  2.  Patient will improve her gait speed to at least 0.50 m/s for improved functional mobility. Baseline:  Goal status: INITIAL  3.  Patient will improve her right elbow extension to within 10 degrees of neutral for improved function reaching.  Baseline: -1 degrees of extension Goal status: MET  4.  Patient will be able to demonstrate at least 120 degrees of active right shoulder flexion for improved function reaching overhead.  Baseline: 5/7: 103 degrees  Goal status: IN PROGRESS  5.  Patient will improve her right hand grip strength to at least 15 pounds for improved function grasping objects with her right hand.  Baseline: 5/7: 5 pounds  Goal status: IN PROGRESS  6.  Patient will improve her LEFS score to at least 22/80 for improved perceived function with her daily activities.  Baseline:  Goal status: IN PROGRESS  PLAN:  PT  FREQUENCY: 2x/week  PT DURATION: 6 weeks  PLANNED INTERVENTIONS: 97164- PT Re-evaluation, 97110-Therapeutic exercises, 97530- Therapeutic activity, [id number]- Neuromuscular re-education, 97535- Self Care, [id number]- Manual  therapy, [phone]- Gait training, [phone]- Electrical stimulation (unattended), [phone]- Vasopneumatic device, Patient/Family education, Balance training, Stair training, Joint mobilization, Cryotherapy, and Moist heat  PLAN FOR NEXT SESSION: Nustep, pulleys, manual therapy to elbow, right UE AAROM/PROM, lower extremity strengthening, and balance interventions   Deryl Flora, PTA 12/09/2023, 2:41 PM

## 2023-12-16 ENCOUNTER — Ambulatory Visit

## 2023-12-16 DIAGNOSIS — M25621 Stiffness of right elbow, not elsewhere classified: Secondary | ICD-10-CM

## 2023-12-16 DIAGNOSIS — M6281 Muscle weakness (generalized): Secondary | ICD-10-CM

## 2023-12-16 DIAGNOSIS — Z9181 History of falling: Secondary | ICD-10-CM | POA: Diagnosis not present

## 2023-12-16 DIAGNOSIS — M25521 Pain in right elbow: Secondary | ICD-10-CM

## 2023-12-16 NOTE — Therapy (Signed)
 OUTPATIENT PHYSICAL THERAPY SHOULDER TREATMENT   Patient Name: Krystal [name] MRN: [id number] DOB:Feb 25, 1941, 83 y.o., female Today's Date: 12/16/2023  END OF SESSION:  PT End of Session - 12/16/23 1401     Visit Number 9    Number of Visits 12    Date for PT Re-Evaluation 01/29/24    PT Start Time 1345    PT Stop Time 1430    PT Time Calculation (min) 45 min    Activity Tolerance Patient tolerated treatment well    Behavior During Therapy WFL for tasks assessed/performed                Past Medical History:  Diagnosis Date   Anemia    Asthma    Brain tumor (HCC)    1986   Breast cancer (HCC)    Right breast   Cataract    Colon polyps    COPD (chronic obstructive pulmonary disease) (HCC)    Hiatal hernia    Hyperlipidemia    Macular degeneration    Osteoporosis    Pneumonia    PVD (posterior vitreous detachment)    Right knee pain    Shingles rash    Past Surgical History:  Procedure Laterality Date   CATARACT EXTRACTION, BILATERAL     COLONOSCOPY     CORONARY PRESSURE/FFR WITH 3D MAPPING N/A 03/21/2021   Procedure: Coronary Pressure Wire/FFR w/3D Mapping;  Surgeon: Sammy Crisp, MD;  Location: [facility];  Service: Cardiovascular;  Laterality: N/A;   CRANIECTOMY / CRANIOTOMY FOR EXCISION OF BRAIN TUMOR     Left side brain   LEFT HEART CATH AND CORONARY ANGIOGRAPHY N/A 03/21/2021   Procedure: LEFT HEART CATH AND CORONARY ANGIOGRAPHY;  Surgeon: Sammy Crisp, MD;  Location: [facility];  Service: Cardiovascular;  Laterality: N/A;   MASTECTOMY Right    ORIF HUMERUS FRACTURE Right 08/17/2023   Procedure: OPEN REDUCTION INTERNAL FIXATION (ORIF) DISTAL HUMERUS FRACTURE;  Surgeon: Laneta Pintos, MD;  Location: [facility];  Service: Orthopedics;  Laterality: Right;   PARTIAL HYSTERECTOMY     POLYPECTOMY     SKIN CANCER EXCISION Left    Under left eye   Patient Active Problem List   Diagnosis Date Noted   Open fracture of distal end of  humerus 08/17/2023   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   Hypoalbuminemia due to protein-calorie malnutrition (HCC) 08/17/2023   Atrial fibrillation, chronic (HCC) 08/17/2023   Open fracture of distal end of right humerus, unspecified fracture morphology, initial encounter 08/16/2023   Chronic combined systolic and diastolic heart failure (HCC) 03/16/2023   Nonrheumatic aortic valve insufficiency 05/13/2022   COPD (chronic obstructive pulmonary disease) (HCC) 04/23/2021   Gait instability 03/28/2021   Chronic right SI joint pain 03/28/2021   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   PAF (paroxysmal atrial fibrillation) (HCC) 03/28/2021   Unstable angina (HCC)    CAD (coronary artery disease) 03/20/2021   Osteoporosis 03/24/2019   Abdominal aortic atherosclerosis (HCC) 04/10/2015   PSVT (paroxysmal supraventricular tachycardia) (HCC) 05/10/2014   Macular degeneration, age related, nonexudative 08/24/2013   History of right mastectomy 07/13/2013   Anemia, iron deficiency 06/21/2013   Allergic rhinitis 06/21/2013   Chronic low back pain 06/21/2013   Malignant neoplasm of female breast (HCC) 11/22/2007   Mixed hyperlipidemia 11/22/2007   Anxiety state 11/22/2007   Depression, recurrent (HCC) 11/22/2007   Asthma 11/22/2007   GERD 11/22/2007   Diaphragmatic hernia 08/14/2007   Hiatal hernia 08/14/2007   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  REFERRING PROVIDER: Dettinger, Lucio Sabin, MD   REFERRING DIAG: Open fracture of distal end of right humerus, unspecified fracture morphology, initial encounter, Weakness of both lower extremities   THERAPY DIAG:  Stiffness of right elbow, not elsewhere classified  Pain in right elbow  History of falling  Muscle weakness (generalized)  Rationale for Evaluation and Treatment: Rehabilitation  ONSET DATE: January 2025  SUBJECTIVE:                                                                                                                                                                                       SUBJECTIVE STATEMENT: Pt reports minimal right arm pain and overall stiffness today.  Hand dominance: Left  PERTINENT HISTORY: Unstable angina, atrial fibrillation, asthma, COPD, osteoporosis, anxiety, depression, legally blind, and history of cancer  PAIN:  Are you having pain? Yes: NPRS scale: no pain score provided Pain location: mid right radius into the hand Pain description: constant, deep, tingly, burn Aggravating factors: nothing Relieving factors: nothing  PRECAUTIONS: Fall  RED FLAGS: None   WEIGHT BEARING RESTRICTIONS: No lifting with the right hand  FALLS:  Has patient fallen in last 6 months? Yes. Number of falls 2  LIVING ENVIRONMENT: Lives with: lives with their family Lives in: House/apartment Stairs: No Has following equipment at home: shower chair, Ramped entry, and hemi walker   OCCUPATION: Retired  PLOF: Independent  PATIENT GOALS: improved safety and reduced fall risk  NEXT MD VISIT: 02/03/24  OBJECTIVE:  Note: Objective measures were completed at Evaluation unless otherwise noted.  DIAGNOSTIC FINDINGS: 08/17/23 right elbow x-ray IMPRESSION: ORIF of distal humerus fracture with improved alignment.  PATIENT SURVEYS:  LEFS 11/80  COGNITION: Overall cognitive status: Within functional limits for tasks assessed     SENSATION: Light touch: Impaired with diminished sensation in right forearm and absent sensation in bilateral lower extremities with no dermatomal pattern   POSTURE: Forward head  UPPER EXTREMITY ROM:   Active ROM Right eval Left eval  Shoulder flexion 95 123  Shoulder extension    Shoulder abduction    Shoulder adduction    Shoulder internal rotation    Shoulder external rotation    Elbow flexion 109 149  Elbow extension 22 0  Wrist flexion    Wrist extension    Wrist ulnar deviation    Wrist radial deviation    Wrist pronation    Wrist supination    (Blank rows =  not tested)  UPPER EXTREMITY MMT:  MMT Right eval Left eval  Shoulder flexion    Shoulder extension    Shoulder abduction    Shoulder adduction    Shoulder internal rotation  Shoulder external rotation    Middle trapezius    Lower trapezius    Elbow flexion    Elbow extension    Wrist flexion    Wrist extension    Wrist ulnar deviation    Wrist radial deviation    Wrist pronation    Wrist supination    Grip strength (lbs) <5; painful  20  (Blank rows = not tested)  LOWER EXTREMITY MMT:    MMT Right eval Left eval  Hip flexion 3+/5 3+/5  Hip extension    Hip abduction    Hip adduction    Hip internal rotation    Hip external rotation    Knee flexion 4/5 4+/5  Knee extension 4+/5; "pressure" 4+/5; "pressure"  Ankle dorsiflexion 3+/5 3+/5  Ankle plantarflexion    Ankle inversion    Ankle eversion     (Blank rows = not tested)   PALPATION:  TTP: right supinator and throughout right hand   GAIT:  Device utilized: hemi walker  Level of assistance: modified independence   Gait speed: 0.18 m/s  Comments: shuffling pattern with step to pattern and poor foot clearance                                                                                                                           TREATMENT DATE:   12/16/23    EXERCISE LOG  Exercise Repetitions and Resistance Comments  Nustep  L4 x 20 minutes   Rockerboard (standing) 3.5 minutes  Flexion  Toe Taps 4" box 2 sets of 12 reps   Ball Squeeze w/ R hand    Seated Marches 3# 2 sets of 10 reps   Seated Hip Adduction  3 mins For shoulder IR   Seated Hip Abduction Red x 3 mins   LAQ 3# 2 sets of 10 reps Alternating LE  Seated Ham Curls Red x 20 reps bil   Gait  To car from chair    Blank cell = exercise not performed today                                    11/26/23 EXERCISE LOG  Exercise Repetitions and Resistance Comments  Nustep  L5 x 15 minutes   Pulleys 5 minutes  Flexion  Digi-flex  Attempted, but  unable to be completed    Power web  Attempted, but unable to be completed   Ball squeeze 3 x 12 reps    Finger flexion/extension 2 minutes   Seated marching  3# x 2 x 12 reps each    Seated heel/toe raises 3 x 12 reps each    Seated hip ADD isometric  3 x 12 w/ 2 second hold   Seated clams Yellow t-band x 2 x 12 reps    Blank cell = exercise not performed today  11/23/23 EXERCISE LOG  Exercise Repetitions and Resistance Comments  Nustep  L3 x 18 minutes   Pulleys  5 minutes  Shoulder flexion  Seated hip ADD isometric  3 minutes w/ 5 second hold   LAQ 3# x 2 x 1 minute  BLE   Seated marching  3# x 2 x 1 minute BLE  Sit to stand  5 reps  With UE support from armrests   Blank cell = exercise not performed today   PATIENT EDUCATION: Education details:  Person educated: Patient Education method: Explanation Education comprehension: verbalized understanding  HOME EXERCISE PROGRAM:   ASSESSMENT:  CLINICAL IMPRESSION: Pt arrives for today's treatment session reporting overall soreness and stiffness.  Pt able to tolerate increased weight with seated LAQs as well as increased resistance with all theraband exercises today.  Pt also able to tolerate increased reps/time with all standing exercises today.  Pt reported "feeling good" at completion of today's treatment session.   OBJECTIVE IMPAIRMENTS: Abnormal gait, decreased activity tolerance, decreased balance, decreased mobility, difficulty walking, decreased ROM, decreased strength, hypomobility, impaired sensation, impaired tone, impaired UE functional use, postural dysfunction, and pain.   ACTIVITY LIMITATIONS: carrying, lifting, standing, transfers, reach over head, and locomotion level  PARTICIPATION LIMITATIONS: cleaning, laundry, shopping, and community activity  PERSONAL FACTORS: Age, Past/current experiences, Time since onset of injury/illness/exacerbation, and 3+ comorbidities: Unstable  angina, atrial fibrillation, asthma, COPD, osteoporosis, anxiety, depression, legally blind, and history of cancer are also affecting patient's functional outcome.   REHAB POTENTIAL: Fair    CLINICAL DECISION MAKING: Unstable/unpredictable  EVALUATION COMPLEXITY: High   GOALS: Goals reviewed with patient? Yes  SHORT TERM GOALS: Target date: 12/01/23  Patient will be independent with her initial HEP.  Baseline: Goal status: MET  2.  Patient will be able to demonstrate at least 120 degrees of active right elbow flexion for improved function eating.  Baseline:5/7: 135 degrees Goal status: MET  3.  Patient will improve his gait speed to at least 0.30 m/s for improved functional mobility.  Baseline: 5/7: .16 m/s Goal status: IN PROGRESS  LONG TERM GOALS: Target date: 12/22/23  Patient will be independent with her advanced HEP.  Baseline:  Goal status: IN PROGRESS  2.  Patient will improve her gait speed to at least 0.50 m/s for improved functional mobility. Baseline:  Goal status: INITIAL  3.  Patient will improve her right elbow extension to within 10 degrees of neutral for improved function reaching.  Baseline: -1 degrees of extension Goal status: MET  4.  Patient will be able to demonstrate at least 120 degrees of active right shoulder flexion for improved function reaching overhead.  Baseline: 5/7: 103 degrees  Goal status: IN PROGRESS  5.  Patient will improve her right hand grip strength to at least 15 pounds for improved function grasping objects with her right hand.  Baseline: 5/7: 5 pounds  Goal status: IN PROGRESS  6.  Patient will improve her LEFS score to at least 22/80 for improved perceived function with her daily activities.  Baseline:  Goal status: IN PROGRESS  PLAN:  PT FREQUENCY: 2x/week  PT DURATION: 6 weeks  PLANNED INTERVENTIONS: 97164- PT Re-evaluation, 97110-Therapeutic exercises, 97530- Therapeutic activity, [id number]- Neuromuscular re-education,  97535- Self Care, 16109- Manual therapy, [phone]- Gait training, [phone]- Electrical stimulation (unattended), 97016- Vasopneumatic device, Patient/Family education, Balance training, Stair training, Joint mobilization, Cryotherapy, and Moist heat  PLAN FOR NEXT SESSION: Nustep, pulleys, manual therapy to elbow, right UE AAROM/PROM, lower extremity strengthening, and balance  interventions   Deryl Flora, PTA 12/16/2023, 3:24 PM

## 2023-12-17 ENCOUNTER — Ambulatory Visit

## 2023-12-17 DIAGNOSIS — Z9181 History of falling: Secondary | ICD-10-CM

## 2023-12-17 DIAGNOSIS — M6281 Muscle weakness (generalized): Secondary | ICD-10-CM

## 2023-12-17 DIAGNOSIS — M25621 Stiffness of right elbow, not elsewhere classified: Secondary | ICD-10-CM

## 2023-12-17 DIAGNOSIS — M25521 Pain in right elbow: Secondary | ICD-10-CM

## 2023-12-17 NOTE — Therapy (Signed)
 OUTPATIENT PHYSICAL THERAPY SHOULDER TREATMENT   Patient Name: Krystal [name] MRN: [id number] DOB:1941-01-12, 83 y.o., female Today's Date: 12/17/2023  END OF SESSION:  PT End of Session - 12/17/23 1341     Visit Number 10    Number of Visits 12    Date for PT Re-Evaluation 01/29/24    PT Start Time 1340    PT Stop Time 1415    PT Time Calculation (min) 35 min    Activity Tolerance Patient tolerated treatment well    Behavior During Therapy WFL for tasks assessed/performed                Past Medical History:  Diagnosis Date   Anemia    Asthma    Brain tumor (HCC)    1986   Breast cancer (HCC)    Right breast   Cataract    Colon polyps    COPD (chronic obstructive pulmonary disease) (HCC)    Hiatal hernia    Hyperlipidemia    Macular degeneration    Osteoporosis    Pneumonia    PVD (posterior vitreous detachment)    Right knee pain    Shingles rash    Past Surgical History:  Procedure Laterality Date   CATARACT EXTRACTION, BILATERAL     COLONOSCOPY     CORONARY PRESSURE/FFR WITH 3D MAPPING N/A 03/21/2021   Procedure: Coronary Pressure Wire/FFR w/3D Mapping;  Surgeon: Sammy Crisp, MD;  Location: [facility];  Service: Cardiovascular;  Laterality: N/A;   CRANIECTOMY / CRANIOTOMY FOR EXCISION OF BRAIN TUMOR     Left side brain   LEFT HEART CATH AND CORONARY ANGIOGRAPHY N/A 03/21/2021   Procedure: LEFT HEART CATH AND CORONARY ANGIOGRAPHY;  Surgeon: Sammy Crisp, MD;  Location: [facility];  Service: Cardiovascular;  Laterality: N/A;   MASTECTOMY Right    ORIF HUMERUS FRACTURE Right 08/17/2023   Procedure: OPEN REDUCTION INTERNAL FIXATION (ORIF) DISTAL HUMERUS FRACTURE;  Surgeon: Laneta Pintos, MD;  Location: [facility];  Service: Orthopedics;  Laterality: Right;   PARTIAL HYSTERECTOMY     POLYPECTOMY     SKIN CANCER EXCISION Left    Under left eye   Patient Active Problem List   Diagnosis Date Noted   Open fracture of distal end of  humerus 08/17/2023   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   Hypoalbuminemia due to protein-calorie malnutrition (HCC) 08/17/2023   Atrial fibrillation, chronic (HCC) 08/17/2023   Open fracture of distal end of right humerus, unspecified fracture morphology, initial encounter 08/16/2023   Chronic combined systolic and diastolic heart failure (HCC) 03/16/2023   Nonrheumatic aortic valve insufficiency 05/13/2022   COPD (chronic obstructive pulmonary disease) (HCC) 04/23/2021   Gait instability 03/28/2021   Chronic right SI joint pain 03/28/2021   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   PAF (paroxysmal atrial fibrillation) (HCC) 03/28/2021   Unstable angina (HCC)    CAD (coronary artery disease) 03/20/2021   Osteoporosis 03/24/2019   Abdominal aortic atherosclerosis (HCC) 04/10/2015   PSVT (paroxysmal supraventricular tachycardia) (HCC) 05/10/2014   Macular degeneration, age related, nonexudative 08/24/2013   History of right mastectomy 07/13/2013   Anemia, iron deficiency 06/21/2013   Allergic rhinitis 06/21/2013   Chronic low back pain 06/21/2013   Malignant neoplasm of female breast (HCC) 11/22/2007   Mixed hyperlipidemia 11/22/2007   Anxiety state 11/22/2007   Depression, recurrent (HCC) 11/22/2007   Asthma 11/22/2007   GERD 11/22/2007   Diaphragmatic hernia 08/14/2007   Hiatal hernia 08/14/2007   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  REFERRING PROVIDER: Dettinger, Lucio Sabin, MD   REFERRING DIAG: Open fracture of distal end of right humerus, unspecified fracture morphology, initial encounter, Weakness of both lower extremities   THERAPY DIAG:  Stiffness of right elbow, not elsewhere classified  Pain in right elbow  History of falling  Muscle weakness (generalized)  Rationale for Evaluation and Treatment: Rehabilitation  ONSET DATE: January 2025  SUBJECTIVE:                                                                                                                                                                                       SUBJECTIVE STATEMENT: Pt reports 3-4/10 right arm pain today.  Pt needs to leave at 2:15 today.   Hand dominance: Left  PERTINENT HISTORY: Unstable angina, atrial fibrillation, asthma, COPD, osteoporosis, anxiety, depression, legally blind, and history of cancer  PAIN:  Are you having pain? Yes: NPRS scale: no pain score provided Pain location: mid right radius into the hand Pain description: constant, deep, tingly, burn Aggravating factors: nothing Relieving factors: nothing  PRECAUTIONS: Fall  RED FLAGS: None   WEIGHT BEARING RESTRICTIONS: No lifting with the right hand  FALLS:  Has patient fallen in last 6 months? Yes. Number of falls 2  LIVING ENVIRONMENT: Lives with: lives with their family Lives in: House/apartment Stairs: No Has following equipment at home: shower chair, Ramped entry, and hemi walker   OCCUPATION: Retired  PLOF: Independent  PATIENT GOALS: improved safety and reduced fall risk  NEXT MD VISIT: 02/03/24  OBJECTIVE:  Note: Objective measures were completed at Evaluation unless otherwise noted.  DIAGNOSTIC FINDINGS: 08/17/23 right elbow x-ray IMPRESSION: ORIF of distal humerus fracture with improved alignment.  PATIENT SURVEYS:  LEFS 11/80  COGNITION: Overall cognitive status: Within functional limits for tasks assessed     SENSATION: Light touch: Impaired with diminished sensation in right forearm and absent sensation in bilateral lower extremities with no dermatomal pattern   POSTURE: Forward head  UPPER EXTREMITY ROM:   Active ROM Right eval Left eval  Shoulder flexion 95 123  Shoulder extension    Shoulder abduction    Shoulder adduction    Shoulder internal rotation    Shoulder external rotation    Elbow flexion 109 149  Elbow extension 22 0  Wrist flexion    Wrist extension    Wrist ulnar deviation    Wrist radial deviation    Wrist pronation    Wrist supination     (Blank rows = not tested)  UPPER EXTREMITY MMT:  MMT Right eval Left eval  Shoulder flexion    Shoulder extension    Shoulder abduction    Shoulder adduction  Shoulder internal rotation    Shoulder external rotation    Middle trapezius    Lower trapezius    Elbow flexion    Elbow extension    Wrist flexion    Wrist extension    Wrist ulnar deviation    Wrist radial deviation    Wrist pronation    Wrist supination    Grip strength (lbs) <5; painful  20  (Blank rows = not tested)  LOWER EXTREMITY MMT:    MMT Right eval Left eval  Hip flexion 3+/5 3+/5  Hip extension    Hip abduction    Hip adduction    Hip internal rotation    Hip external rotation    Knee flexion 4/5 4+/5  Knee extension 4+/5; "pressure" 4+/5; "pressure"  Ankle dorsiflexion 3+/5 3+/5  Ankle plantarflexion    Ankle inversion    Ankle eversion     (Blank rows = not tested)   PALPATION:  TTP: right supinator and throughout right hand   GAIT:  Device utilized: hemi walker  Level of assistance: modified independence   Gait speed: 0.18 m/s  Comments: shuffling pattern with step to pattern and poor foot clearance                                                                                                                           TREATMENT DATE:   12/17/23    EXERCISE LOG  Exercise Repetitions and Resistance Comments  Nustep  L4 x 15 minutes   Rockerboard (standing)  Flexion  Toe Taps    Ball Squeeze w/ R hand    Seated Marches 3# 2 sets of 12 reps   Seated Hip Adduction   For shoulder IR   Seated Hip Abduction    LAQ 3# 2 sets of 12 reps Alternating LE  Seated Ham Curls Red x 25 reps bil   Gait      Blank cell = exercise not performed today                                    11/26/23 EXERCISE LOG  Exercise Repetitions and Resistance Comments  Nustep  L5 x 15 minutes   Pulleys 5 minutes  Flexion  Digi-flex  Attempted, but unable to be completed    Power web  Attempted, but  unable to be completed   Ball squeeze 3 x 12 reps    Finger flexion/extension 2 minutes   Seated marching  3# x 2 x 12 reps each    Seated heel/toe raises 3 x 12 reps each    Seated hip ADD isometric  3 x 12 w/ 2 second hold   Seated clams Yellow t-band x 2 x 12 reps    Blank cell = exercise not performed today  11/23/23 EXERCISE LOG  Exercise Repetitions and Resistance Comments  Nustep  L3 x 18 minutes   Pulleys  5 minutes  Shoulder flexion  Seated hip ADD isometric  3 minutes w/ 5 second hold   LAQ 3# x 2 x 1 minute  BLE   Seated marching  3# x 2 x 1 minute BLE  Sit to stand  5 reps  With UE support from armrests   Blank cell = exercise not performed today   PATIENT EDUCATION: Education details:  Person educated: Patient Education method: Explanation Education comprehension: verbalized understanding  HOME EXERCISE PROGRAM:   ASSESSMENT:  CLINICAL IMPRESSION: Pt arrives for today's treatment session reporting 3-4/10 right arm pain.  Pt able to increase LEFS score to 27/80, meeting her LTG.  Pt able to demonstrate 110 degrees of right shoulder flexion making good progress towards her ROM goal.  Pt able to tolerate increased reps with all previously performed exercises.   Pt requesting to terminate session early today due to her son having an appointment in [place].  Pt denied any change in pain at completion of today's treatment session.   OBJECTIVE IMPAIRMENTS: Abnormal gait, decreased activity tolerance, decreased balance, decreased mobility, difficulty walking, decreased ROM, decreased strength, hypomobility, impaired sensation, impaired tone, impaired UE functional use, postural dysfunction, and pain.   ACTIVITY LIMITATIONS: carrying, lifting, standing, transfers, reach over head, and locomotion level  PARTICIPATION LIMITATIONS: cleaning, laundry, shopping, and community activity  PERSONAL FACTORS: Age, Past/current experiences, Time  since onset of injury/illness/exacerbation, and 3+ comorbidities: Unstable angina, atrial fibrillation, asthma, COPD, osteoporosis, anxiety, depression, legally blind, and history of cancer are also affecting patient's functional outcome.   REHAB POTENTIAL: Fair    CLINICAL DECISION MAKING: Unstable/unpredictable  EVALUATION COMPLEXITY: High   GOALS: Goals reviewed with patient? Yes  SHORT TERM GOALS: Target date: 12/01/23  Patient will be independent with her initial HEP.  Baseline: Goal status: MET  2.  Patient will be able to demonstrate at least 120 degrees of active right elbow flexion for improved function eating.  Baseline:5/7: 135 degrees Goal status: MET  3.  Patient will improve his gait speed to at least 0.30 m/s for improved functional mobility.  Baseline: 5/7: .16 m/s Goal status: IN PROGRESS  LONG TERM GOALS: Target date: 12/22/23  Patient will be independent with her advanced HEP.  Baseline:  Goal status: IN PROGRESS  2.  Patient will improve her gait speed to at least 0.50 m/s for improved functional mobility. Baseline:  Goal status: IN PROGRESS  3.  Patient will improve her right elbow extension to within 10 degrees of neutral for improved function reaching.  Baseline: -1 degrees of extension Goal status: MET  4.  Patient will be able to demonstrate at least 120 degrees of active right shoulder flexion for improved function reaching overhead.  Baseline: 5/7: 103 degrees: 110 degree Goal status: IN PROGRESS  5.  Patient will improve her right hand grip strength to at least 15 pounds for improved function grasping objects with her right hand.  Baseline: 5/7: 5 pounds  Goal status: IN PROGRESS  6.  Patient will improve her LEFS score to at least 22/80 for improved perceived function with her daily activities.  Baseline: 5/15: 27/80 Goal status: MET  PLAN:  PT FREQUENCY: 2x/week  PT DURATION: 6 weeks  PLANNED INTERVENTIONS: 97164- PT Re-evaluation,  97110-Therapeutic exercises, 97530- Therapeutic activity, 97112- Neuromuscular re-education, 97535- Self Care, 16109- Manual therapy, [id number]- Gait training, [phone]- Electrical stimulation (unattended), 97016- Vasopneumatic device,  Patient/Family education, Balance training, Stair training, Joint mobilization, Cryotherapy, and Moist heat  PLAN FOR NEXT SESSION: Nustep, pulleys, manual therapy to elbow, right UE AAROM/PROM, lower extremity strengthening, and balance interventions   Deryl Flora, PTA 12/17/2023, 2:24 PM

## 2023-12-18 ENCOUNTER — Telehealth: Payer: Self-pay

## 2023-12-18 NOTE — Telephone Encounter (Signed)
 Paperwork sitting on PCPs desk to sign

## 2023-12-18 NOTE — Telephone Encounter (Signed)
 Copied from CRM [id number]. Topic: General - Other >> Dec 18, 2023  2:39 PM Phil Braun wrote: Reason for CRM:   Pt is needing her incontinence supplies asap. Please fax the request over for her pullups

## 2023-12-23 ENCOUNTER — Ambulatory Visit: Admitting: Physical Therapy

## 2023-12-23 DIAGNOSIS — M25621 Stiffness of right elbow, not elsewhere classified: Secondary | ICD-10-CM | POA: Diagnosis not present

## 2023-12-23 DIAGNOSIS — M6281 Muscle weakness (generalized): Secondary | ICD-10-CM

## 2023-12-23 DIAGNOSIS — Z9181 History of falling: Secondary | ICD-10-CM

## 2023-12-23 DIAGNOSIS — M25521 Pain in right elbow: Secondary | ICD-10-CM | POA: Diagnosis not present

## 2023-12-23 NOTE — Therapy (Signed)
 OUTPATIENT PHYSICAL THERAPY SHOULDER TREATMENT   Patient Name: Krystal [name] MRN: [id number] DOB:Apr 20, 1941, 83 y.o., female Today's Date: 12/23/2023  END OF SESSION:  PT End of Session - 12/23/23 1427     Visit Number 11    Number of Visits 12    Date for PT Re-Evaluation 01/29/24    PT Start Time 0145    PT Stop Time 0226    PT Time Calculation (min) 41 min    Activity Tolerance Patient tolerated treatment well    Behavior During Therapy [facility] for tasks assessed/performed                 Past Medical History:  Diagnosis Date   Anemia    Asthma    Brain tumor (HCC)    1986   Breast cancer (HCC)    Right breast   Cataract    Colon polyps    COPD (chronic obstructive pulmonary disease) (HCC)    Hiatal hernia    Hyperlipidemia    Macular degeneration    Osteoporosis    Pneumonia    PVD (posterior vitreous detachment)    Right knee pain    Shingles rash    Past Surgical History:  Procedure Laterality Date   CATARACT EXTRACTION, BILATERAL     COLONOSCOPY     CORONARY PRESSURE/FFR WITH 3D MAPPING N/A 03/21/2021   Procedure: Coronary Pressure Wire/FFR w/3D Mapping;  Surgeon: Sammy Crisp, MD;  Location: [facility];  Service: Cardiovascular;  Laterality: N/A;   CRANIECTOMY / CRANIOTOMY FOR EXCISION OF BRAIN TUMOR     Left side brain   LEFT HEART CATH AND CORONARY ANGIOGRAPHY N/A 03/21/2021   Procedure: LEFT HEART CATH AND CORONARY ANGIOGRAPHY;  Surgeon: Sammy Crisp, MD;  Location: [facility];  Service: Cardiovascular;  Laterality: N/A;   MASTECTOMY Right    ORIF HUMERUS FRACTURE Right 08/17/2023   Procedure: OPEN REDUCTION INTERNAL FIXATION (ORIF) DISTAL HUMERUS FRACTURE;  Surgeon: Laneta Pintos, MD;  Location: [facility] OR;  Service: Orthopedics;  Laterality: Right;   PARTIAL HYSTERECTOMY     POLYPECTOMY     SKIN CANCER EXCISION Left    Under left eye   Patient Active Problem List   Diagnosis Date Noted   Open fracture of distal end  of humerus 08/17/2023   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   Hypoalbuminemia due to protein-calorie malnutrition (HCC) 08/17/2023   Atrial fibrillation, chronic (HCC) 08/17/2023   Open fracture of distal end of right humerus, unspecified fracture morphology, initial encounter 08/16/2023   Chronic combined systolic and diastolic heart failure (HCC) 03/16/2023   Nonrheumatic aortic valve insufficiency 05/13/2022   COPD (chronic obstructive pulmonary disease) (HCC) 04/23/2021   Gait instability 03/28/2021   Chronic right SI joint pain 03/28/2021   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   PAF (paroxysmal atrial fibrillation) (HCC) 03/28/2021   Unstable angina (HCC)    CAD (coronary artery disease) 03/20/2021   Osteoporosis 03/24/2019   Abdominal aortic atherosclerosis (HCC) 04/10/2015   PSVT (paroxysmal supraventricular tachycardia) (HCC) 05/10/2014   Macular degeneration, age related, nonexudative 08/24/2013   History of right mastectomy 07/13/2013   Anemia, iron deficiency 06/21/2013   Allergic rhinitis 06/21/2013   Chronic low back pain 06/21/2013   Malignant neoplasm of female breast (HCC) 11/22/2007   Mixed hyperlipidemia 11/22/2007   Anxiety state 11/22/2007   Depression, recurrent (HCC) 11/22/2007   Asthma 11/22/2007   GERD 11/22/2007   Diaphragmatic hernia 08/14/2007   Hiatal hernia 08/14/2007   ADENOMATOUS COLONIC POLYP  02/23/2007   REFERRING PROVIDER: Dettinger, Lucio Sabin, MD   REFERRING DIAG: Open fracture of distal end of right humerus, unspecified fracture morphology, initial encounter, Weakness of both lower extremities   THERAPY DIAG:  No diagnosis found.  Rationale for Evaluation and Treatment: Rehabilitation  ONSET DATE: January 2025  SUBJECTIVE:                                                                                                                                                                                      SUBJECTIVE STATEMENT: Pt reports 3-4/10 right arm pain  today.  Pt needs to leave at 2:15 today.   Hand dominance: Left  PERTINENT HISTORY: Unstable angina, atrial fibrillation, asthma, COPD, osteoporosis, anxiety, depression, legally blind, and history of cancer  PAIN:  Are you having pain? Yes: NPRS scale: no pain score provided Pain location: mid right radius into the hand Pain description: constant, deep, tingly, burn Aggravating factors: nothing Relieving factors: nothing  PRECAUTIONS: Fall  RED FLAGS: None   WEIGHT BEARING RESTRICTIONS: No lifting with the right hand  FALLS:  Has patient fallen in last 6 months? Yes. Number of falls 2  LIVING ENVIRONMENT: Lives with: lives with their family Lives in: House/apartment Stairs: No Has following equipment at home: shower chair, Ramped entry, and hemi walker   OCCUPATION: Retired  PLOF: Independent  PATIENT GOALS: improved safety and reduced fall risk  NEXT MD VISIT: 02/03/24  OBJECTIVE:  Note: Objective measures were completed at Evaluation unless otherwise noted.  DIAGNOSTIC FINDINGS: 08/17/23 right elbow x-ray IMPRESSION: ORIF of distal humerus fracture with improved alignment.  PATIENT SURVEYS:  LEFS 11/80  COGNITION: Overall cognitive status: Within functional limits for tasks assessed     SENSATION: Light touch: Impaired with diminished sensation in right forearm and absent sensation in bilateral lower extremities with no dermatomal pattern   POSTURE: Forward head  UPPER EXTREMITY ROM:   Active ROM Right eval Left eval  Shoulder flexion 95 123  Shoulder extension    Shoulder abduction    Shoulder adduction    Shoulder internal rotation    Shoulder external rotation    Elbow flexion 109 149  Elbow extension 22 0  Wrist flexion    Wrist extension    Wrist ulnar deviation    Wrist radial deviation    Wrist pronation    Wrist supination    (Blank rows = not tested)  UPPER EXTREMITY MMT:  MMT Right eval Left eval  Shoulder flexion     Shoulder extension    Shoulder abduction    Shoulder adduction    Shoulder internal rotation    Shoulder external rotation  Middle trapezius    Lower trapezius    Elbow flexion    Elbow extension    Wrist flexion    Wrist extension    Wrist ulnar deviation    Wrist radial deviation    Wrist pronation    Wrist supination    Grip strength (lbs) <5; painful  20  (Blank rows = not tested)  LOWER EXTREMITY MMT:    MMT Right eval Left eval  Hip flexion 3+/5 3+/5  Hip extension    Hip abduction    Hip adduction    Hip internal rotation    Hip external rotation    Knee flexion 4/5 4+/5  Knee extension 4+/5; "pressure" 4+/5; "pressure"  Ankle dorsiflexion 3+/5 3+/5  Ankle plantarflexion    Ankle inversion    Ankle eversion     (Blank rows = not tested)   PALPATION:  TTP: right supinator and throughout right hand   GAIT:  Device utilized: hemi walker  Level of assistance: modified independence   Gait speed: 0.18 m/s  Comments: shuffling pattern with step to pattern and poor foot clearance                                                                                                                           TREATMENT DATE:   12/23/23:                                     EXERCISE LOG  Exercise Repetitions and Resistance Comments  Nustep Level 4 x 20 minutes   Ball squeezes 4 minutes   Hip Abduction Red theraband x 4 minutes   Seated marching 3# x 4 minutes   LAQ's 3# x 4 minutes   Sit to stand with min HHA 2 minutes     Blank cell = exercise not performed today   12/17/23    EXERCISE LOG  Exercise Repetitions and Resistance Comments  Nustep  L4 x 15 minutes   Rockerboard (standing)  Flexion  Toe Taps    Ball Squeeze w/ R hand    Seated Marches 3# 2 sets of 12 reps   Seated Hip Adduction   For shoulder IR   Seated Hip Abduction    LAQ 3# 2 sets of 12 reps Alternating LE  Seated Ham Curls Red x 25 reps bil   Gait      Blank cell = exercise not  performed today                                    11/26/23 EXERCISE LOG  Exercise Repetitions and Resistance Comments  Nustep  L5 x 15 minutes   Pulleys 5 minutes  Flexion  Digi-flex  Attempted, but unable to be completed    Power web  Attempted, but unable to be completed  Ball squeeze 3 x 12 reps    Finger flexion/extension 2 minutes   Seated marching  3# x 2 x 12 reps each    Seated heel/toe raises 3 x 12 reps each    Seated hip ADD isometric  3 x 12 w/ 2 second hold   Seated clams Yellow t-band x 2 x 12 reps    Blank cell = exercise not performed today                                    11/23/23 EXERCISE LOG  Exercise Repetitions and Resistance Comments  Nustep  L3 x 18 minutes   Pulleys  5 minutes  Shoulder flexion  Seated hip ADD isometric  3 minutes w/ 5 second hold   LAQ 3# x 2 x 1 minute  BLE   Seated marching  3# x 2 x 1 minute BLE  Sit to stand  5 reps  With UE support from armrests   Blank cell = exercise not performed today   PATIENT EDUCATION: Education details:  Person educated: Patient Education method: Explanation Education comprehension: verbalized understanding  HOME EXERCISE PROGRAM:   ASSESSMENT:  CLINICAL IMPRESSION: Patient very motivated with her treatment today.  She states she is compliant with her HEP.  Her gait remains slow and purposefully at this time with a hemi-walker.  12/17/23 PROGRESS REPORT:  Patient is making good progress with skilled physical therapy as evidenced by her objective measures, functional mobility, and progress toward her goals. She was able to meet or make progress toward all of her goals. However, she continues to exhibit reduced gait speed and right shoulder mobility. Recommend that she continue with skilled physical therapy to address her remaining impairments to maximize her functional mobility and safety.   Glendora Landsman, PT, DPT   OBJECTIVE IMPAIRMENTS: Abnormal gait, decreased activity tolerance, decreased  balance, decreased mobility, difficulty walking, decreased ROM, decreased strength, hypomobility, impaired sensation, impaired tone, impaired UE functional use, postural dysfunction, and pain.   ACTIVITY LIMITATIONS: carrying, lifting, standing, transfers, reach over head, and locomotion level  PARTICIPATION LIMITATIONS: cleaning, laundry, shopping, and community activity  PERSONAL FACTORS: Age, Past/current experiences, Time since onset of injury/illness/exacerbation, and 3+ comorbidities: Unstable angina, atrial fibrillation, asthma, COPD, osteoporosis, anxiety, depression, legally blind, and history of cancer are also affecting patient's functional outcome.   REHAB POTENTIAL: Fair    CLINICAL DECISION MAKING: Unstable/unpredictable  EVALUATION COMPLEXITY: High   GOALS: Goals reviewed with patient? Yes  SHORT TERM GOALS: Target date: 12/01/23  Patient will be independent with her initial HEP.  Baseline: Goal status: MET  2.  Patient will be able to demonstrate at least 120 degrees of active right elbow flexion for improved function eating.  Baseline:5/7: 135 degrees Goal status: MET  3.  Patient will improve his gait speed to at least 0.30 m/s for improved functional mobility.  Baseline: 5/7: .16 m/s Goal status: IN PROGRESS  LONG TERM GOALS: Target date: 12/22/23  Patient will be independent with her advanced HEP.  Baseline:  Goal status: IN PROGRESS  2.  Patient will improve her gait speed to at least 0.50 m/s for improved functional mobility. Baseline:  Goal status: IN PROGRESS  3.  Patient will improve her right elbow extension to within 10 degrees of neutral for improved function reaching.  Baseline: -1 degrees of extension Goal status: MET  4.  Patient will be able to demonstrate  at least 120 degrees of active right shoulder flexion for improved function reaching overhead.  Baseline: 5/7: 103 degrees: 110 degree Goal status: IN PROGRESS  5.  Patient will  improve her right hand grip strength to at least 15 pounds for improved function grasping objects with her right hand.  Baseline: 5/7: 5 pounds  Goal status: IN PROGRESS  6.  Patient will improve her LEFS score to at least 22/80 for improved perceived function with her daily activities.  Baseline: 5/15: 27/80 Goal status: MET  PLAN:  PT FREQUENCY: 2x/week  PT DURATION: 6 weeks  PLANNED INTERVENTIONS: 97164- PT Re-evaluation, 97110-Therapeutic exercises, 97530- Therapeutic activity, 97112- Neuromuscular re-education, 97535- Self Care, 09811- Manual therapy, [phone]- Gait training, [phone]- Electrical stimulation (unattended), 97016- Vasopneumatic device, Patient/Family education, Balance training, Stair training, Joint mobilization, Cryotherapy, and Moist heat  PLAN FOR NEXT SESSION: Nustep, pulleys, manual therapy to elbow, right UE AAROM/PROM, lower extremity strengthening, and balance interventions   Eleasha Cataldo, [place], PT 12/23/2023, 3:23 PM

## 2023-12-24 ENCOUNTER — Ambulatory Visit

## 2023-12-24 DIAGNOSIS — M25621 Stiffness of right elbow, not elsewhere classified: Secondary | ICD-10-CM

## 2023-12-24 DIAGNOSIS — M25521 Pain in right elbow: Secondary | ICD-10-CM | POA: Diagnosis not present

## 2023-12-24 DIAGNOSIS — M6281 Muscle weakness (generalized): Secondary | ICD-10-CM

## 2023-12-24 DIAGNOSIS — Z9181 History of falling: Secondary | ICD-10-CM

## 2023-12-24 DIAGNOSIS — B351 Tinea unguium: Secondary | ICD-10-CM | POA: Diagnosis not present

## 2023-12-24 NOTE — Telephone Encounter (Signed)
 Paperwork faxed back to [employer]

## 2023-12-24 NOTE — Therapy (Signed)
 OUTPATIENT PHYSICAL THERAPY SHOULDER TREATMENT   Patient Name: Krystal [name] MRN: [id number] DOB:01/19/1941, 83 y.o., female Today's Date: 12/24/2023  END OF SESSION:  PT End of Session - 12/24/23 1440     Visit Number 12    Number of Visits 12    Date for PT Re-Evaluation 01/29/24    PT Start Time 1430    Activity Tolerance Patient tolerated treatment well    Behavior During Therapy [facility] for tasks assessed/performed                  Past Medical History:  Diagnosis Date   Anemia    Asthma    Brain tumor (HCC)    1986   Breast cancer (HCC)    Right breast   Cataract    Colon polyps    COPD (chronic obstructive pulmonary disease) (HCC)    Hiatal hernia    Hyperlipidemia    Macular degeneration    Osteoporosis    Pneumonia    PVD (posterior vitreous detachment)    Right knee pain    Shingles rash    Past Surgical History:  Procedure Laterality Date   CATARACT EXTRACTION, BILATERAL     COLONOSCOPY     CORONARY PRESSURE/FFR WITH 3D MAPPING N/A 03/21/2021   Procedure: Coronary Pressure Wire/FFR w/3D Mapping;  Surgeon: Sammy Crisp, MD;  Location: [facility];  Service: Cardiovascular;  Laterality: N/A;   CRANIECTOMY / CRANIOTOMY FOR EXCISION OF BRAIN TUMOR     Left side brain   LEFT HEART CATH AND CORONARY ANGIOGRAPHY N/A 03/21/2021   Procedure: LEFT HEART CATH AND CORONARY ANGIOGRAPHY;  Surgeon: Sammy Crisp, MD;  Location: [facility];  Service: Cardiovascular;  Laterality: N/A;   MASTECTOMY Right    ORIF HUMERUS FRACTURE Right 08/17/2023   Procedure: OPEN REDUCTION INTERNAL FIXATION (ORIF) DISTAL HUMERUS FRACTURE;  Surgeon: Laneta Pintos, MD;  Location: [facility] OR;  Service: Orthopedics;  Laterality: Right;   PARTIAL HYSTERECTOMY     POLYPECTOMY     SKIN CANCER EXCISION Left    Under left eye   Patient Active Problem List   Diagnosis Date Noted   Open fracture of distal end of humerus 08/17/2023   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    Hypoalbuminemia due to protein-calorie malnutrition (HCC) 08/17/2023   Atrial fibrillation, chronic (HCC) 08/17/2023   Open fracture of distal end of right humerus, unspecified fracture morphology, initial encounter 08/16/2023   Chronic combined systolic and diastolic heart failure (HCC) 03/16/2023   Nonrheumatic aortic valve insufficiency 05/13/2022   COPD (chronic obstructive pulmonary disease) (HCC) 04/23/2021   Gait instability 03/28/2021   Chronic right SI joint pain 03/28/2021   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   PAF (paroxysmal atrial fibrillation) (HCC) 03/28/2021   Unstable angina (HCC)    CAD (coronary artery disease) 03/20/2021   Osteoporosis 03/24/2019   Abdominal aortic atherosclerosis (HCC) 04/10/2015   PSVT (paroxysmal supraventricular tachycardia) (HCC) 05/10/2014   Macular degeneration, age related, nonexudative 08/24/2013   History of right mastectomy 07/13/2013   Anemia, iron deficiency 06/21/2013   Allergic rhinitis 06/21/2013   Chronic low back pain 06/21/2013   Malignant neoplasm of female breast (HCC) 11/22/2007   Mixed hyperlipidemia 11/22/2007   Anxiety state 11/22/2007   Depression, recurrent (HCC) 11/22/2007   Asthma 11/22/2007   GERD 11/22/2007   Diaphragmatic hernia 08/14/2007   Hiatal hernia 08/14/2007   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   REFERRING PROVIDER: Dettinger, Lucio Sabin, MD   REFERRING DIAG: Open fracture  of distal end of right humerus, unspecified fracture morphology, initial encounter, Weakness of both lower extremities   THERAPY DIAG:  Stiffness of right elbow, not elsewhere classified  Pain in right elbow  History of falling  Muscle weakness (generalized)  Rationale for Evaluation and Treatment: Rehabilitation  ONSET DATE: January 2025  SUBJECTIVE:                                                                                                                                                                                       SUBJECTIVE STATEMENT: Patient reports that she is really tired today. She feels that she has gotten better since starting physical therapy.   Hand dominance: Left  PERTINENT HISTORY: Unstable angina, atrial fibrillation, asthma, COPD, osteoporosis, anxiety, depression, legally blind, and history of cancer  PAIN:  Are you having pain? Yes: NPRS scale: no pain score provided Pain location: mid right radius into the hand Pain description: constant, deep, tingly, burn Aggravating factors: nothing Relieving factors: nothing  PRECAUTIONS: Fall  RED FLAGS: None   WEIGHT BEARING RESTRICTIONS: No lifting with the right hand  FALLS:  Has patient fallen in last 6 months? Yes. Number of falls 2  LIVING ENVIRONMENT: Lives with: lives with their family Lives in: House/apartment Stairs: No Has following equipment at home: shower chair, Ramped entry, and hemi walker   OCCUPATION: Retired  PLOF: Independent  PATIENT GOALS: improved safety and reduced fall risk  NEXT MD VISIT: 02/03/24  OBJECTIVE:  Note: Objective measures were completed at Evaluation unless otherwise noted.  DIAGNOSTIC FINDINGS: 08/17/23 right elbow x-ray IMPRESSION: ORIF of distal humerus fracture with improved alignment.  PATIENT SURVEYS:  LEFS 11/80  COGNITION: Overall cognitive status: Within functional limits for tasks assessed     SENSATION: Light touch: Impaired with diminished sensation in right forearm and absent sensation in bilateral lower extremities with no dermatomal pattern   POSTURE: Forward head  UPPER EXTREMITY ROM:   Active ROM Right eval Left eval  Shoulder flexion 95 123  Shoulder extension    Shoulder abduction    Shoulder adduction    Shoulder internal rotation    Shoulder external rotation    Elbow flexion 109 149  Elbow extension 22 0  Wrist flexion    Wrist extension    Wrist ulnar deviation    Wrist radial deviation    Wrist pronation    Wrist supination     (Blank rows = not tested)  UPPER EXTREMITY MMT:  MMT Right eval Left eval  Shoulder flexion    Shoulder extension    Shoulder abduction    Shoulder adduction    Shoulder internal rotation  Shoulder external rotation    Middle trapezius    Lower trapezius    Elbow flexion    Elbow extension    Wrist flexion    Wrist extension    Wrist ulnar deviation    Wrist radial deviation    Wrist pronation    Wrist supination    Grip strength (lbs) <5; painful  20  (Blank rows = not tested)  LOWER EXTREMITY MMT:    MMT Right eval Left eval  Hip flexion 3+/5 3+/5  Hip extension    Hip abduction    Hip adduction    Hip internal rotation    Hip external rotation    Knee flexion 4/5 4+/5  Knee extension 4+/5; "pressure" 4+/5; "pressure"  Ankle dorsiflexion 3+/5 3+/5  Ankle plantarflexion    Ankle inversion    Ankle eversion     (Blank rows = not tested)   PALPATION:  TTP: right supinator and throughout right hand   GAIT:  Device utilized: hemi walker  Level of assistance: modified independence   Gait speed: 0.18 m/s  Comments: shuffling pattern with step to pattern and poor foot clearance                                                                                                                           TREATMENT DATE:                                    12/24/23 EXERCISE LOG  Exercise Repetitions and Resistance Comments  Nustep  L4 x 20 minutes   Goal assessment                 Blank cell = exercise not performed today   12/23/23:  EXERCISE LOG  Exercise Repetitions and Resistance Comments  Nustep Level 4 x 20 minutes   Ball squeezes 4 minutes   Hip Abduction Red theraband x 4 minutes   Seated marching 3# x 4 minutes   LAQ's 3# x 4 minutes   Sit to stand with min HHA 2 minutes     Blank cell = exercise not performed today   12/17/23    EXERCISE LOG  Exercise Repetitions and Resistance Comments  Nustep  L4 x 15 minutes   Rockerboard (standing)   Flexion  Toe Taps    Ball Squeeze w/ R hand    Seated Marches 3# 2 sets of 12 reps   Seated Hip Adduction   For shoulder IR   Seated Hip Abduction    LAQ 3# 2 sets of 12 reps Alternating LE  Seated Ham Curls Red x 25 reps bil   Gait      Blank cell = exercise not performed today   PATIENT EDUCATION: Education details:  Person educated: Patient Education method: Explanation Education comprehension: verbalized understanding  HOME EXERCISE PROGRAM:   ASSESSMENT:  CLINICAL IMPRESSION: Patient has made  minimal progress with skilled physical therapy since her progress report on 12/17/23.  She was unable to demonstrate any improvement in her right shoulder mobility, grip strength, and gait speed.  Her HEP was reviewed and she reported feeling comfortable with these interventions at home.  She will be discharged at this time due to a plateau in progress.  She reported feeling comfortable being discharged at this time.  PHYSICAL THERAPY DISCHARGE SUMMARY  Visits from Start of Care: 12  Current functional level related to goals / functional outcomes: Patient was able to partially meet her goals for skilled physical therapy.   Remaining deficits: Gait speed, right shoulder active range of motion, and grip strength   Education / Equipment: HEP  Patient agrees to discharge. Patient goals were partially met. Patient is being discharged due to maximized rehab potential.   OBJECTIVE IMPAIRMENTS: Abnormal gait, decreased activity tolerance, decreased balance, decreased mobility, difficulty walking, decreased ROM, decreased strength, hypomobility, impaired sensation, impaired tone, impaired UE functional use, postural dysfunction, and pain.   ACTIVITY LIMITATIONS: carrying, lifting, standing, transfers, reach over head, and locomotion level  PARTICIPATION LIMITATIONS: cleaning, laundry, shopping, and community activity  PERSONAL FACTORS: Age, Past/current experiences, Time since onset of  injury/illness/exacerbation, and 3+ comorbidities: Unstable angina, atrial fibrillation, asthma, COPD, osteoporosis, anxiety, depression, legally blind, and history of cancer are also affecting patient's functional outcome.   REHAB POTENTIAL: Fair    CLINICAL DECISION MAKING: Unstable/unpredictable  EVALUATION COMPLEXITY: High   GOALS: Goals reviewed with patient? Yes  SHORT TERM GOALS: Target date: 12/01/23  Patient will be independent with her initial HEP.  Baseline: Goal status: MET  2.  Patient will be able to demonstrate at least 120 degrees of active right elbow flexion for improved function eating.  Baseline:5/7: 135 degrees Goal status: MET  3.  Patient will improve his gait speed to at least 0.30 m/s for improved functional mobility.  Baseline: 5/7: .16 m/s 12/24/23: #1: 0.12 m/s #2: 0.14 m/s  Goal status: NOT MET  LONG TERM GOALS: Target date: 12/22/23  Patient will be independent with her advanced HEP.  Baseline:  Goal status: MET  2.  Patient will improve her gait speed to at least 0.50 m/s for improved functional mobility. Baseline:  Goal status: NOT MET  3.  Patient will improve her right elbow extension to within 10 degrees of neutral for improved function reaching.  Baseline: -1 degrees of extension Goal status: MET  4.  Patient will be able to demonstrate at least 120 degrees of active right shoulder flexion for improved function reaching overhead.  Baseline: 5/7: 103 degrees: 110 degrees 12/24/23: 97 degrees Goal status: NOT MET  5.  Patient will improve her right hand grip strength to at least 15 pounds for improved function grasping objects with her right hand.  Baseline: 5/7: 5 pounds  12/24/23: unable to assess Goal status: NOT MET  6.  Patient will improve her LEFS score to at least 22/80 for improved perceived function with her daily activities.  Baseline: 5/15: 27/80 Goal status: MET  PLAN:  PT FREQUENCY: 2x/week  PT DURATION: 6  weeks  PLANNED INTERVENTIONS: 97164- PT Re-evaluation, 97110-Therapeutic exercises, 97530- Therapeutic activity, 97112- Neuromuscular re-education, 97535- Self Care, 16109- Manual therapy, [phone]- Gait training, [phone]- Electrical stimulation (unattended), 97016- Vasopneumatic device, Patient/Family education, Balance training, Stair training, Joint mobilization, Cryotherapy, and Moist heat  PLAN FOR NEXT SESSION: Nustep, pulleys, manual therapy to elbow, right UE AAROM/PROM, lower extremity strengthening, and balance interventions   Maceo Sax  Radonna Buggy, PT 12/24/2023, 3:38 PM

## 2023-12-28 DIAGNOSIS — S42201A Unspecified fracture of upper end of right humerus, initial encounter for closed fracture: Secondary | ICD-10-CM | POA: Diagnosis not present

## 2023-12-30 ENCOUNTER — Encounter

## 2024-01-11 DIAGNOSIS — H353124 Nonexudative age-related macular degeneration, left eye, advanced atrophic with subfoveal involvement: Secondary | ICD-10-CM | POA: Diagnosis not present

## 2024-01-11 DIAGNOSIS — H353113 Nonexudative age-related macular degeneration, right eye, advanced atrophic without subfoveal involvement: Secondary | ICD-10-CM | POA: Diagnosis not present

## 2024-01-22 ENCOUNTER — Other Ambulatory Visit: Payer: Self-pay | Admitting: Cardiology

## 2024-01-22 DIAGNOSIS — I480 Paroxysmal atrial fibrillation: Secondary | ICD-10-CM

## 2024-01-22 NOTE — Telephone Encounter (Signed)
 Prescription refill request for Eliquis  received. Indication:afib Last office visit:10/24 Scr:0.84  3/25 Age: 83 Weight:67.1  kg  Prescription refilled

## 2024-01-28 ENCOUNTER — Other Ambulatory Visit: Payer: Self-pay | Admitting: Cardiology

## 2024-01-28 DIAGNOSIS — S42201A Unspecified fracture of upper end of right humerus, initial encounter for closed fracture: Secondary | ICD-10-CM | POA: Diagnosis not present

## 2024-02-03 ENCOUNTER — Ambulatory Visit: Admitting: Family Medicine

## 2024-02-03 ENCOUNTER — Encounter: Payer: Self-pay | Admitting: Family Medicine

## 2024-02-03 VITALS — BP 107/63 | HR 83 | Ht 63.0 in | Wt 144.0 lb

## 2024-02-03 DIAGNOSIS — R7301 Impaired fasting glucose: Secondary | ICD-10-CM

## 2024-02-03 DIAGNOSIS — E782 Mixed hyperlipidemia: Secondary | ICD-10-CM

## 2024-02-03 DIAGNOSIS — I480 Paroxysmal atrial fibrillation: Secondary | ICD-10-CM

## 2024-02-03 DIAGNOSIS — I5042 Chronic combined systolic (congestive) and diastolic (congestive) heart failure: Secondary | ICD-10-CM | POA: Diagnosis not present

## 2024-02-03 LAB — BAYER DCA HB A1C WAIVED: HB A1C (BAYER DCA - WAIVED): 6.1 % — ABNORMAL HIGH (ref 4.8–5.6)

## 2024-02-03 NOTE — Progress Notes (Signed)
 BP 107/63   Pulse 83   Ht 5' 3 (1.6 m)   Wt 144 lb (65.3 kg)   SpO2 95%   BMI 25.51 kg/m    Subjective:   Patient ID: Krystal [name], female    DOB: 1940/10/06, 83 y.o.   MRN: [id number]  HPI: Krystal [name] is a 83 y.o. female presenting on 02/03/2024 for Medical Management of Chronic Issues, Hyperlipidemia, Weight Loss, and Gait Problem   HPI Hyperlipidemia Patient is coming in for recheck of his hyperlipidemia. The patient is currently taking metoprolol . They deny any issues with myalgias or history of liver damage from it. They deny any focal numbness or weakness or chest pain.   A-fib and CHF recheck Patient is currently on metoprolol  and diltiazem , and their blood pressure today is 107/63 and heart rate of 83. Patient denies any lightheadedness or dizziness. Patient denies headaches, blurred vision, chest pains, shortness of breath, or weakness. Denies any side effects from medication and is content with current medication.   Relevant past medical, surgical, family and social history reviewed and updated as indicated. Interim medical history since our last visit reviewed. Allergies and medications reviewed and updated.  Review of Systems  Constitutional:  Negative for chills and fever.  Eyes:  Negative for visual disturbance.  Respiratory:  Negative for chest tightness and shortness of breath.   Cardiovascular:  Positive for leg swelling. Negative for chest pain.  Genitourinary:  Negative for difficulty urinating and dysuria.  Musculoskeletal:  Negative for back pain and gait problem.  Skin:  Negative for rash.  Neurological:  Negative for dizziness, light-headedness and headaches.  Psychiatric/Behavioral:  Negative for agitation and behavioral problems.   All other systems reviewed and are negative.   Per HPI unless specifically indicated above   Allergies as of 02/03/2024       Reactions   Actonel [risedronate] Nausea And Vomiting, Other (See Comments)    Throat tightness   Fosamax [alendronate Sodium] Other (See Comments)   esophagitis   Mevacor [lovastatin] Other (See Comments)   myalgias   Miacalcin [calcitonin (salmon)] Nausea And Vomiting   Mobic  [meloxicam ] Hives, Itching   rash        Medication List        Accurate as of February 03, 2024  1:57 PM. If you have any questions, ask your nurse or doctor.          acetaminophen  500 MG tablet Commonly known as: TYLENOL  Take 2 tablets (1,000 mg total) by mouth every 6 (six) hours as needed for mild pain (pain score 1-3) or moderate pain (pain score 4-6).   Calcium  Citrate-Vitamin D  315-250 MG-UNIT Tabs Commonly known as: Calcium  Citrate + D Take 2 tablets by mouth daily.   diltiazem  180 MG 24 hr capsule Commonly known as: CARDIZEM  CD Take 1 capsule (180 mg total) by mouth daily.   docusate sodium  100 MG capsule Commonly known as: COLACE Take 1 capsule (100 mg total) by mouth 2 (two) times daily.   Eliquis  5 MG Tabs tablet Generic drug: apixaban  TAKE 1 TABLET BY MOUTH TWICE A DAY   esomeprazole  40 MG capsule Commonly known as: NEXIUM  Take 1 capsule (40 mg total) by mouth daily.   famotidine  20 MG tablet Commonly known as: PEPCID  TAKE ONE TABLET DAILY AFTER SUPPER   ferrous sulfate  325 (65 FE) MG tablet Take 1 tablet (325 mg total) by mouth at bedtime.   ipratropium-albuterol  0.5-2.5 (3) MG/3ML Soln Commonly known as: DUONEB Take  3 mLs by nebulization every 6 (six) hours as needed. Dx:  Asthma 493.90   linaclotide  72 MCG capsule Commonly known as: Linzess  Take 1 capsule (72 mcg total) by mouth daily before breakfast.   methocarbamol  500 MG tablet Commonly known as: ROBAXIN  Take 1 tablet (500 mg total) by mouth every 8 (eight) hours as needed for muscle spasms.   metoprolol  tartrate 25 MG tablet Commonly known as: LOPRESSOR  Take 0.5 tablets (12.5 mg total) by mouth 2 (two) times daily.   montelukast  10 MG tablet Commonly known as: SINGULAIR  TAKE 1 TABLET  BY MOUTH EVERYDAY AT BEDTIME   polyethylene glycol 17 g packet Commonly known as: MIRALAX  / GLYCOLAX  Take 17 g by mouth daily as needed for mild constipation.   PRESERVISION/LUTEIN PO Take 1 capsule by mouth at bedtime.   rosuvastatin  20 MG tablet Commonly known as: CRESTOR  TAKE 1 TABLET BY MOUTH EVERY DAY   sertraline  100 MG tablet Commonly known as: ZOLOFT  Take 1 tablet (100 mg total) by mouth daily.   Vitamin D3 125 MCG (5000 UT) Caps Take 5,000 Units by mouth daily.         Objective:   BP 107/63   Pulse 83   Ht 5' 3 (1.6 m)   Wt 144 lb (65.3 kg)   SpO2 95%   BMI 25.51 kg/m   Wt Readings from Last 3 Encounters:  02/03/24 144 lb (65.3 kg)  10/23/23 148 lb (67.1 kg)  09/16/23 149 lb (67.6 kg)    Physical Exam Vitals and nursing note reviewed.  Constitutional:      General: She is not in acute distress.    Appearance: She is well-developed. She is not diaphoretic.  Eyes:     Conjunctiva/sclera: Conjunctivae normal.  Cardiovascular:     Rate and Rhythm: Normal rate. Rhythm irregular.     Heart sounds: Murmur heard.     Crescendo decrescendo systolic murmur is present with a grade of 3/6.  Pulmonary:     Effort: Pulmonary effort is normal. No respiratory distress.     Breath sounds: Normal breath sounds.  Musculoskeletal:        General: Swelling (2+ peripheral edema) present.  Skin:    General: Skin is warm and dry.     Findings: No rash.  Neurological:     Mental Status: She is alert and oriented to person, place, and time.     Coordination: Coordination normal.  Psychiatric:        Behavior: Behavior normal.       Assessment & Plan:   Problem List Items Addressed This Visit       Cardiovascular and Mediastinum   PAF (paroxysmal atrial fibrillation) (HCC)   Relevant Orders   CBC with Differential/Platelet   Chronic combined systolic and diastolic heart failure (HCC)   Relevant Orders   CBC with Differential/Platelet     Other   Mixed  hyperlipidemia - Primary   Relevant Orders   CBC with Differential/Platelet   CMP14+EGFR   Other Visit Diagnoses       Elevated fasting blood sugar       Relevant Orders   Bayer DCA Hb A1c Waived     Will check blood work on the way out today.  Blood pressure runs a little on the lower side but she has been more stable and not currently taking anything for blood pressure, mainly taking medicines to help control her A-fib.  Denies any shortness of breath.  CHF seems stable.  Continues to see cardiology as well.  Follow up plan: Return in about 3 months (around 05/05/2024), or if symptoms worsen or fail to improve, for hld and chf.  Counseling provided for all of the vaccine components Orders Placed This Encounter  Procedures   CBC with Differential/Platelet   CMP14+EGFR   Bayer DCA Hb A1c Waived    Fonda Levins, MD [facility] 02/03/2024, 1:57 PM

## 2024-02-04 LAB — CBC WITH DIFFERENTIAL/PLATELET
Basophils Absolute: 0 10*3/uL (ref 0.0–0.2)
Basos: 1 %
EOS (ABSOLUTE): 0.1 10*3/uL (ref 0.0–0.4)
Eos: 2 %
Hematocrit: 41.2 % (ref 34.0–46.6)
Hemoglobin: 13.2 g/dL (ref 11.1–15.9)
Immature Grans (Abs): 0 10*3/uL (ref 0.0–0.1)
Immature Granulocytes: 0 %
Lymphocytes Absolute: 1.7 10*3/uL (ref 0.7–3.1)
Lymphs: 29 %
MCH: 30.1 pg (ref 26.6–33.0)
MCHC: 32 g/dL (ref 31.5–35.7)
MCV: 94 fL (ref 79–97)
Monocytes Absolute: 0.5 10*3/uL (ref 0.1–0.9)
Monocytes: 8 %
Neutrophils Absolute: 3.6 10*3/uL (ref 1.4–7.0)
Neutrophils: 60 %
Platelets: 149 10*3/uL — ABNORMAL LOW (ref 150–450)
RBC: 4.39 x10E6/uL (ref 3.77–5.28)
RDW: 13.1 % (ref 11.7–15.4)
WBC: 6 10*3/uL (ref 3.4–10.8)

## 2024-02-04 LAB — CMP14+EGFR
ALT: 16 IU/L (ref 0–32)
AST: 23 IU/L (ref 0–40)
Albumin: 4 g/dL (ref 3.7–4.7)
Alkaline Phosphatase: 68 IU/L (ref 44–121)
BUN/Creatinine Ratio: 20 (ref 12–28)
BUN: 18 mg/dL (ref 8–27)
Bilirubin Total: 0.4 mg/dL (ref 0.0–1.2)
CO2: 25 mmol/L (ref 20–29)
Calcium: 9.2 mg/dL (ref 8.7–10.3)
Chloride: 103 mmol/L (ref 96–106)
Creatinine, Ser: 0.88 mg/dL (ref 0.57–1.00)
Globulin, Total: 1.9 g/dL (ref 1.5–4.5)
Glucose: 100 mg/dL — ABNORMAL HIGH (ref 70–99)
Potassium: 4 mmol/L (ref 3.5–5.2)
Sodium: 143 mmol/L (ref 134–144)
Total Protein: 5.9 g/dL — ABNORMAL LOW (ref 6.0–8.5)
eGFR: 65 mL/min/{1.73_m2} (ref >59)

## 2024-02-08 ENCOUNTER — Ambulatory Visit: Payer: Self-pay | Admitting: Family Medicine

## 2024-02-09 ENCOUNTER — Telehealth: Payer: Self-pay

## 2024-02-09 DIAGNOSIS — S42411D Displaced simple supracondylar fracture without intercondylar fracture of right humerus, subsequent encounter for fracture with routine healing: Secondary | ICD-10-CM | POA: Diagnosis not present

## 2024-02-09 NOTE — Telephone Encounter (Signed)
 Copied from CRM [id number]. Topic: Clinical - Lab/Test Results >> Feb 09, 2024  3:13 PM Emylou G wrote: Reason for CRM: Relayed labs to patient

## 2024-02-09 NOTE — Telephone Encounter (Signed)
 E2C2 read lab results.

## 2024-02-19 ENCOUNTER — Other Ambulatory Visit: Payer: Self-pay

## 2024-02-19 ENCOUNTER — Ambulatory Visit (HOSPITAL_COMMUNITY): Attending: Student | Admitting: Occupational Therapy

## 2024-02-19 ENCOUNTER — Encounter (HOSPITAL_COMMUNITY): Payer: Self-pay | Admitting: Occupational Therapy

## 2024-02-19 DIAGNOSIS — M25631 Stiffness of right wrist, not elsewhere classified: Secondary | ICD-10-CM | POA: Insufficient documentation

## 2024-02-19 DIAGNOSIS — M25531 Pain in right wrist: Secondary | ICD-10-CM | POA: Insufficient documentation

## 2024-02-19 DIAGNOSIS — M25511 Pain in right shoulder: Secondary | ICD-10-CM | POA: Insufficient documentation

## 2024-02-19 DIAGNOSIS — M25521 Pain in right elbow: Secondary | ICD-10-CM | POA: Insufficient documentation

## 2024-02-19 DIAGNOSIS — M25611 Stiffness of right shoulder, not elsewhere classified: Secondary | ICD-10-CM | POA: Insufficient documentation

## 2024-02-19 DIAGNOSIS — M25621 Stiffness of right elbow, not elsewhere classified: Secondary | ICD-10-CM | POA: Diagnosis not present

## 2024-02-19 DIAGNOSIS — R29898 Other symptoms and signs involving the musculoskeletal system: Secondary | ICD-10-CM | POA: Diagnosis not present

## 2024-02-19 NOTE — Therapy (Signed)
 OUTPATIENT OCCUPATIONAL THERAPY ORTHO EVALUATION  Patient Name: Krystal [name] MRN: [id number] DOB:[birth year], 83 y.o., female Today's Date: 02/19/2024  PCP: Maryanne Chew, MD REFERRING PROVIDER: Danton Domino, PA-C  END OF SESSION:  OT End of Session - 02/19/24 1611     Visit Number 1    Number of Visits 9    Date for OT Re-Evaluation 03/18/24    Authorization Type [employer]    OT Start Time 1306    OT Stop Time 1350    OT Time Calculation (min) 44 min    Activity Tolerance Patient tolerated treatment well    Behavior During Therapy WFL for tasks assessed/performed          Past Medical History:  Diagnosis Date   Anemia    Asthma    Brain tumor (HCC)    1986   Breast cancer (HCC)    Right breast   Cataract    Colon polyps    COPD (chronic obstructive pulmonary disease) (HCC)    Hiatal hernia    Hyperlipidemia    Macular degeneration    Osteoporosis    Pneumonia    PVD (posterior vitreous detachment)    Right knee pain    Shingles rash    Past Surgical History:  Procedure Laterality Date   CATARACT EXTRACTION, BILATERAL     COLONOSCOPY     CORONARY PRESSURE/FFR WITH 3D MAPPING N/A 03/21/2021   Procedure: Coronary Pressure Wire/FFR w/3D Mapping;  Surgeon: Mady Bruckner, MD;  Location: [facility];  Service: Cardiovascular;  Laterality: N/A;   CRANIECTOMY / CRANIOTOMY FOR EXCISION OF BRAIN TUMOR     Left side brain   LEFT HEART CATH AND CORONARY ANGIOGRAPHY N/A 03/21/2021   Procedure: LEFT HEART CATH AND CORONARY ANGIOGRAPHY;  Surgeon: Mady Bruckner, MD;  Location: [facility];  Service: Cardiovascular;  Laterality: N/A;   MASTECTOMY Right    ORIF HUMERUS FRACTURE Right 08/17/2023   Procedure: OPEN REDUCTION INTERNAL FIXATION (ORIF) DISTAL HUMERUS FRACTURE;  Surgeon: Kendal Franky SQUIBB, MD;  Location: [facility];  Service: Orthopedics;  Laterality: Right;   PARTIAL HYSTERECTOMY     POLYPECTOMY     SKIN CANCER EXCISION Left    Under  left eye   Patient Active Problem List   Diagnosis Date Noted   Open fracture of distal end of humerus 08/17/2023   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   Hypoalbuminemia due to protein-calorie malnutrition (HCC) 08/17/2023   Atrial fibrillation, chronic (HCC) 08/17/2023   Open fracture of distal end of right humerus, unspecified fracture morphology, initial encounter 08/16/2023   Chronic combined systolic and diastolic heart failure (HCC) 03/16/2023   Nonrheumatic aortic valve insufficiency 05/13/2022   COPD (chronic obstructive pulmonary disease) (HCC) 04/23/2021   Gait instability 03/28/2021   Chronic right SI joint pain 03/28/2021   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   PAF (paroxysmal atrial fibrillation) (HCC) 03/28/2021   Unstable angina (HCC)    CAD (coronary artery disease) 03/20/2021   Osteoporosis 03/24/2019   Abdominal aortic atherosclerosis (HCC) 04/10/2015   PSVT (paroxysmal supraventricular tachycardia) (HCC) 05/10/2014   Macular degeneration, age related, nonexudative 08/24/2013   History of right mastectomy 07/13/2013   Anemia, iron deficiency 06/21/2013   Allergic rhinitis 06/21/2013   Chronic low back pain 06/21/2013   Malignant neoplasm of female breast (HCC) 11/22/2007   Mixed hyperlipidemia 11/22/2007   Anxiety state 11/22/2007   Depression, recurrent (HCC) 11/22/2007   Asthma 11/22/2007   GERD 11/22/2007   Diaphragmatic hernia 08/14/2007  Hiatal hernia 08/14/2007   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    ONSET DATE: 08/16/23  REFERRING DIAG: Radial Nerve Palsy  THERAPY DIAG:  Right shoulder pain, unspecified chronicity - Plan: Ot plan of care cert/re-cert  Shoulder stiffness, right - Plan: Ot plan of care cert/re-cert  Pain in right wrist - Plan: Ot plan of care cert/re-cert  Other symptoms and signs involving the musculoskeletal system - Plan: Ot plan of care cert/re-cert  Stiffness of right wrist, not elsewhere classified - Plan: Ot plan of care  cert/re-cert  Rationale for Evaluation and Treatment: Rehabilitation  SUBJECTIVE:   SUBJECTIVE STATEMENT: I want to get my hand working again Pt accompanied by: self  PERTINENT HISTORY: Pt had a fall 08/16/23 where she fractured her radius. Pt required ORIF of radius. Pt spent 3 weeks in inpatient rehab, 1 month in home health, and 1 month in outpatient rehab.   PRECAUTIONS: Fall  WEIGHT BEARING RESTRICTIONS: Due to palsy she is not allowed to lift weight.   PAIN:  Are you having pain? Yes: NPRS scale: 5/10 Pain location: forearm to MCPs Pain description: aching Aggravating factors: movement  Relieving factors: rest  FALLS: Has patient fallen in last 6 months? Yes. Number of falls 3  LIVING ENVIRONMENT: Lives with: lives with their family Lives in: House/apartment  PLOF: Independent with basic ADLs  PATIENT GOALS: To get her hand moving  NEXT MD VISIT: 02/29/24  OBJECTIVE:  Note: Objective measures were completed at Evaluation unless otherwise noted.  HAND DOMINANCE: Left  ADLs: Overall ADLs: Pt unable to complete any fine motor tasks such as zippers, buttons, fasteners. She is unable to cook for herself or clean. Pt not able to lift anything.   FUNCTIONAL OUTCOME MEASURES: Quick Dash: 75  UPPER EXTREMITY ROM:     Active ROM Right eval  Shoulder flexion 87  Shoulder abduction 78  Shoulder internal rotation 90  Shoulder external rotation 20  Elbow flexion 134  Elbow extension 7  Wrist flexion 40  Wrist extension 8  Wrist ulnar deviation 44  Wrist radial deviation -1  Wrist pronation WFL  Wrist supination 70  (Blank rows = not tested)  Active ROM Right eval  Thumb MCP (0-60) 30  Thumb IP (0-80) 35  Thumb Opposition to Small Finger 4.5cm  Index MCP (0-90) 70  Index PIP (0-100) 80  Index DIP (0-70) 50   Long MCP (0-90)  70  Long PIP (0-100)  80  Long DIP (0-70)  55  Ring MCP (0-90)  75  Ring PIP (0-100)  85  Ring DIP (0-70)  60  Little MCP  (0-90)  80  Little PIP (0-100)  95  Little DIP (0-70)  65  (Blank rows = not tested)   UPPER EXTREMITY MMT:     MMT Right eval  Shoulder flexion 4-/5  Shoulder abduction 4-/5  Shoulder internal rotation 4/5  Shoulder external rotation 4/5  Elbow flexion 3+/5  Elbow extension 4/5  Wrist flexion 3-/5  Wrist extension 3-/5  Wrist ulnar deviation 3/5  Wrist radial deviation 2/5  Wrist pronation 3/5  Wrist supination 3/5  (Blank rows = not tested)  HAND FUNCTION: Grip strength: Right: 14 lbs; Left: 49 lbs, Lateral pinch: Right: 3 lbs, Left: 9 lbs, and 3 point pinch: Right: unable lbs, Left: 9 lbs  COORDINATION: Box and Blocks:  Right 3blocks, Left 38blocks  SENSATION: Light touch: Impaired   EDEMA: No Edema noted  OBSERVATIONS: Decreased attention and problem solving, requiring increased cuing.  TREATMENT DATE:   02/19/24 -Evaluation Only                                                                                                                                  PATIENT EDUCATION: Education details: Will start first session Person educated: Patient Education method: Explanation and Demonstration Education comprehension: verbalized understanding and returned demonstration  HOME EXERCISE PROGRAM: Will start next session  GOALS: Goals reviewed with patient? Yes   LONG TERM GOALS: Target date: 03/18/24  Pt provided and educated on HEP for RUE mobility in order to improve ADL independence.   Goal status: INITIAL  2.  Pt will improve RUE shoulder ROM by 30* and wrist ROM by 20* in order to reach overhead and behind her back, as well as opening doors.   Goal status: INITIAL  3.  Pt will improve RUE strength to 4/5 in order to lift and carry items during light house work and meal prep.    Goal status: INITIAL  4.  Pt will improve grip strength by 15# and pinch strength by 4# in order to grip and hold items during dressing and grooming.  Goal status:  INITIAL  5.  Pt will improve RUE coordination by completing 20 blocks in 1' with box and blocks in order to complete buckles, fasteners, and buttons with compensatory strategies.   Goal status: INITIAL   ASSESSMENT:  CLINICAL IMPRESSION: Patient is a 83 y.o. female who was seen today for occupational therapy evaluation for RUE pain and weakness following a fall, s/p ORIF of radius. She is limited in ROM, strength, and coordination, which affects her independence in ADL's and IADL's.    PERFORMANCE DEFICITS: in functional skills including ADLs, IADLs, sensation, edema, ROM, strength, pain, fascial restrictions, Fine motor control, Gross motor control, body mechanics, and UE functional use.  IMPAIRMENTS: are limiting patient from ADLs, IADLs, rest and sleep, work, leisure, and social participation.   COMORBIDITIES: may have co-morbidities  that affects occupational performance. Patient will benefit from skilled OT to address above impairments and improve overall function.  MODIFICATION OR ASSISTANCE TO COMPLETE EVALUATION: Min-Moderate modification of tasks or assist with assess necessary to complete an evaluation.  OT OCCUPATIONAL PROFILE AND HISTORY: Detailed assessment: Review of records and additional review of physical, cognitive, psychosocial history related to current functional performance.  CLINICAL DECISION MAKING: Moderate - several treatment options, min-mod task modification necessary  REHAB POTENTIAL: Good  EVALUATION COMPLEXITY: Moderate      PLAN:  OT FREQUENCY: 2x/week  OT DURATION: 4 weeks  PLANNED INTERVENTIONS: 97168 OT Re-evaluation, 97535 self care/ADL training, 02889 therapeutic exercise, 97530 therapeutic activity, 97112 neuromuscular re-education, 97140 manual therapy, 97035 ultrasound, 97018 paraffin, 02989 moist heat, 97032 electrical stimulation (manual), passive range of motion, functional mobility training, energy conservation, coping strategies  training, patient/family education, and DME and/or AE instructions  RECOMMENDED OTHER SERVICES: N/A  CONSULTED AND AGREED WITH PLAN OF CARE: Patient and  family member/caregiver  PLAN FOR NEXT SESSION: Manual Therapy as needed, AA/ROM, A/ROM as able, stretches   Valentin Nightingale, OTR/L [facility] Outpatient rehab [phone] Valentin Jillyn Nightingale, OT 02/19/2024, 4:12 PM

## 2024-02-22 ENCOUNTER — Ambulatory Visit (HOSPITAL_COMMUNITY): Admitting: Occupational Therapy

## 2024-02-22 ENCOUNTER — Encounter (HOSPITAL_COMMUNITY): Payer: Self-pay | Admitting: Occupational Therapy

## 2024-02-22 ENCOUNTER — Telehealth: Payer: Self-pay | Admitting: Family Medicine

## 2024-02-22 DIAGNOSIS — M25621 Stiffness of right elbow, not elsewhere classified: Secondary | ICD-10-CM | POA: Diagnosis not present

## 2024-02-22 DIAGNOSIS — M25611 Stiffness of right shoulder, not elsewhere classified: Secondary | ICD-10-CM | POA: Diagnosis not present

## 2024-02-22 DIAGNOSIS — M25531 Pain in right wrist: Secondary | ICD-10-CM

## 2024-02-22 DIAGNOSIS — M25521 Pain in right elbow: Secondary | ICD-10-CM | POA: Diagnosis not present

## 2024-02-22 DIAGNOSIS — M25631 Stiffness of right wrist, not elsewhere classified: Secondary | ICD-10-CM | POA: Diagnosis not present

## 2024-02-22 DIAGNOSIS — R29898 Other symptoms and signs involving the musculoskeletal system: Secondary | ICD-10-CM

## 2024-02-22 DIAGNOSIS — M25511 Pain in right shoulder: Secondary | ICD-10-CM | POA: Diagnosis not present

## 2024-02-22 MED ORDER — DILTIAZEM HCL ER COATED BEADS 180 MG PO CP24: 180.0000 mg | ORAL_CAPSULE | Freq: Every day | ORAL | 1 refills | Status: DC

## 2024-02-22 NOTE — Telephone Encounter (Signed)
 Sent diltiazem  for her

## 2024-02-22 NOTE — Telephone Encounter (Signed)
 Patient requesting refill on diltiazem  (CARDIZEM  CD) 180 MG 24 hr capsule.  Stated she had asked [facility] to contact office to refill but never heard enything  [facility] [place], [place]

## 2024-02-22 NOTE — Patient Instructions (Signed)
AROM Exercises: *Complete exercises ___10___ times each, ___2-3____ times per day*  1) Wrist Flexion  Start with wrist at edge of table, palm facing up. With wrist hanging slightly off table, curl wrist upward, and back down.      2) Wrist Extension  Start with wrist at edge of table, palm facing down. With wrist slightly off the edge of the table, curl wrist up and back down.      3) Radial Deviations  Start with forearm flat against a table, wrist hanging slightly off the edge, and palm facing the wall. Bending at the wrist only, and keeping palm facing the wall, bend wrist so fist is pointing towards the floor, back up to start position, and up towards the ceiling. Return to start.        4) WRIST PRONATION  Turn your forearm towards palm face down.  Keep your elbow bent and by the side of your  Body.      5) WRIST SUPINATION  Turn your forearm towards palm face up.  Keep your elbow bent and by the side of your  Body.            

## 2024-02-22 NOTE — Therapy (Signed)
 OUTPATIENT OCCUPATIONAL THERAPY ORTHO TREATMENT  Patient Name: Krystal [name] MRN: [id number] DOB:1941/02/12, 83 y.o., female Today's Date: 02/22/2024    END OF SESSION:  OT End of Session - 02/22/24 1356     Visit Number 2    Number of Visits 9    Date for OT Re-Evaluation 03/18/24    Authorization Type [employer]    OT Start Time 1319    OT Stop Time 1402    OT Time Calculation (min) 43 min    Activity Tolerance Patient tolerated treatment well    Behavior During Therapy WFL for tasks assessed/performed           Past Medical History:  Diagnosis Date   Anemia    Asthma    Brain tumor (HCC)    1986   Breast cancer (HCC)    Right breast   Cataract    Colon polyps    COPD (chronic obstructive pulmonary disease) (HCC)    Hiatal hernia    Hyperlipidemia    Macular degeneration    Osteoporosis    Pneumonia    PVD (posterior vitreous detachment)    Right knee pain    Shingles rash    Past Surgical History:  Procedure Laterality Date   CATARACT EXTRACTION, BILATERAL     COLONOSCOPY     CORONARY PRESSURE/FFR WITH 3D MAPPING N/A 03/21/2021   Procedure: Coronary Pressure Wire/FFR w/3D Mapping;  Surgeon: Mady Bruckner, MD;  Location: [facility];  Service: Cardiovascular;  Laterality: N/A;   CRANIECTOMY / CRANIOTOMY FOR EXCISION OF BRAIN TUMOR     Left side brain   LEFT HEART CATH AND CORONARY ANGIOGRAPHY N/A 03/21/2021   Procedure: LEFT HEART CATH AND CORONARY ANGIOGRAPHY;  Surgeon: Mady Bruckner, MD;  Location: [facility];  Service: Cardiovascular;  Laterality: N/A;   MASTECTOMY Right    ORIF HUMERUS FRACTURE Right 08/17/2023   Procedure: OPEN REDUCTION INTERNAL FIXATION (ORIF) DISTAL HUMERUS FRACTURE;  Surgeon: Kendal Franky SQUIBB, MD;  Location: [facility];  Service: Orthopedics;  Laterality: Right;   PARTIAL HYSTERECTOMY     POLYPECTOMY     SKIN CANCER EXCISION Left    Under left eye   Patient Active Problem List   Diagnosis Date Noted    Open fracture of distal end of humerus 08/17/2023   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   Hypoalbuminemia due to protein-calorie malnutrition (HCC) 08/17/2023   Atrial fibrillation, chronic (HCC) 08/17/2023   Open fracture of distal end of right humerus, unspecified fracture morphology, initial encounter 08/16/2023   Chronic combined systolic and diastolic heart failure (HCC) 03/16/2023   Nonrheumatic aortic valve insufficiency 05/13/2022   COPD (chronic obstructive pulmonary disease) (HCC) 04/23/2021   Gait instability 03/28/2021   Chronic right SI joint pain 03/28/2021   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   PAF (paroxysmal atrial fibrillation) (HCC) 03/28/2021   Unstable angina (HCC)    CAD (coronary artery disease) 03/20/2021   Osteoporosis 03/24/2019   Abdominal aortic atherosclerosis (HCC) 04/10/2015   PSVT (paroxysmal supraventricular tachycardia) (HCC) 05/10/2014   Macular degeneration, age related, nonexudative 08/24/2013   History of right mastectomy 07/13/2013   Anemia, iron deficiency 06/21/2013   Allergic rhinitis 06/21/2013   Chronic low back pain 06/21/2013   Malignant neoplasm of female breast (HCC) 11/22/2007   Mixed hyperlipidemia 11/22/2007   Anxiety state 11/22/2007   Depression, recurrent (HCC) 11/22/2007   Asthma 11/22/2007   GERD 11/22/2007   Diaphragmatic hernia 08/14/2007   Hiatal hernia 08/14/2007   ADENOMATOUS  COLONIC POLYP 02/23/2007   PCP: Maryanne Chew, MD  REFERRING PROVIDER: Danton Domino, PA-C  ONSET DATE: 08/16/23  REFERRING DIAG: Radial Nerve Palsy  THERAPY DIAG:  Right shoulder pain, unspecified chronicity  Shoulder stiffness, right  Pain in right wrist  Other symptoms and signs involving the musculoskeletal system  Stiffness of right wrist, not elsewhere classified  Stiffness of right elbow, not elsewhere classified  Pain in right elbow  Rationale for Evaluation and Treatment: Rehabilitation  SUBJECTIVE:   SUBJECTIVE STATEMENT: S:  It's about the same.   PERTINENT HISTORY: Pt had a fall 08/16/23 where she fractured her radius. Pt required ORIF of radius. Pt spent 3 weeks in inpatient rehab, 1 month in home health, and 1 month in outpatient rehab.   PRECAUTIONS: Fall  WEIGHT BEARING RESTRICTIONS: Due to palsy she is not allowed to lift weight.   PAIN:  Are you having pain? No  FALLS: Has patient fallen in last 6 months? Yes. Number of falls 3  LIVING ENVIRONMENT: Lives with: lives with their family Lives in: House/apartment  PLOF: Independent with basic ADLs  PATIENT GOALS: To get her hand moving  NEXT MD VISIT: 02/29/24  OBJECTIVE:  Note: Objective measures were completed at Evaluation unless otherwise noted.  HAND DOMINANCE: Left  ADLs: Overall ADLs: Pt unable to complete any fine motor tasks such as zippers, buttons, fasteners. She is unable to cook for herself or clean. Pt not able to lift anything.   FUNCTIONAL OUTCOME MEASURES: Quick Dash: 75  UPPER EXTREMITY ROM:     Active ROM Right eval  Shoulder flexion 87  Shoulder abduction 78  Shoulder internal rotation 90  Shoulder external rotation 20  Elbow flexion 134  Elbow extension 7  Wrist flexion 40  Wrist extension 8  Wrist ulnar deviation 44  Wrist radial deviation -1  Wrist pronation WFL  Wrist supination 70  (Blank rows = not tested)  Active ROM Right eval  Thumb MCP (0-60) 30  Thumb IP (0-80) 35  Thumb Opposition to Small Finger 4.5cm  Index MCP (0-90) 70  Index PIP (0-100) 80  Index DIP (0-70) 50   Long MCP (0-90)  70  Long PIP (0-100)  80  Long DIP (0-70)  55  Ring MCP (0-90)  75  Ring PIP (0-100)  85  Ring DIP (0-70)  60  Little MCP (0-90)  80  Little PIP (0-100)  95  Little DIP (0-70)  65  (Blank rows = not tested)   UPPER EXTREMITY MMT:     MMT Right eval  Shoulder flexion 4-/5  Shoulder abduction 4-/5  Shoulder internal rotation 4/5  Shoulder external rotation 4/5  Elbow flexion 3+/5  Elbow  extension 4/5  Wrist flexion 3-/5  Wrist extension 3-/5  Wrist ulnar deviation 3/5  Wrist radial deviation 2/5  Wrist pronation 3/5  Wrist supination 3/5  (Blank rows = not tested)  HAND FUNCTION: Grip strength: Right: 14 lbs; Left: 49 lbs, Lateral pinch: Right: 3 lbs, Left: 9 lbs, and 3 point pinch: Right: unable lbs, Left: 9 lbs  COORDINATION: Box and Blocks:  Right 3blocks, Left 38blocks  SENSATION: Light touch: Impaired   OBSERVATIONS: Decreased attention and problem solving, requiring increased cuing.    TREATMENT DATE:  02/22/24 -AA/ROM shoulder: sitting-protraction, flexion, 10 reps -A/ROM: shoulder-horizontal abduction, 10 reps -A/ROM: wrist-extension, flexion, ulnar/radial deviation, forearm pronation/supination, 10 reps -Table slides: flexion, 10 reps -Digit A/ROM: thumb abduction/adduction, 10 reps -Sponges: 3, 4, 4 -Towel crumple: 10 reps  -Digit  abduction/adduction: 10 reps -Finger taps: 10 reps-max difficulty -Theraputty: yellow-flatten, pushing pvc pipe into putty to cut circles, rolling, gripping -UBE: level 1, 2' forward, 2' reverse, pace: 4.0       PATIENT EDUCATION: Education details: wrist A/ROM Person educated: Patient Education method: Explanation and Demonstration Education comprehension: verbalized understanding and returned demonstration  HOME EXERCISE PROGRAM: 02/22/24: wrist A/ROM    GOALS: Goals reviewed with patient? Yes   LONG TERM GOALS: Target date: 03/18/24  Pt provided and educated on HEP for RUE mobility in order to improve ADL independence.   Goal status: IN PROGRESS  2.  Pt will improve RUE shoulder ROM by 30* and wrist ROM by 20* in order to reach overhead and behind her back, as well as opening doors.   Goal status:  IN PROGRESS  3.  Pt will improve RUE strength to 4/5 in order to lift and carry items during light house work and meal prep.    Goal status:  IN PROGRESS  4.  Pt will improve grip strength by 15# and  pinch strength by 4# in order to grip and hold items during dressing and grooming.  Goal status:  IN PROGRESS  5.  Pt will improve RUE coordination by completing 20 blocks in 1' with box and blocks in order to complete buckles, fasteners, and buttons with compensatory strategies.   Goal status: IN PROGRESS   ASSESSMENT:  CLINICAL IMPRESSION: Pt reports no change since last week, no pain today. Initiated RUE ROM and strength work, OT noting trapezius activation with shoulder mobility tasks. Pt requiring verbal cuing to watch the demonstration prior to attempting the task, completing each task with verbal and visual cuing for correct form and technique. Pt completing grip work with yellow theraputty, max difficulty with PVC task due to compensatory extreme wrist flexion. Provided HEP for wrist mobility to begin at home.   PERFORMANCE DEFICITS: in functional skills including ADLs, IADLs, sensation, edema, ROM, strength, pain, fascial restrictions, Fine motor control, Gross motor control, body mechanics, and UE functional use.      PLAN:  OT FREQUENCY: 2x/week  OT DURATION: 4 weeks  PLANNED INTERVENTIONS: 97168 OT Re-evaluation, 97535 self care/ADL training, 02889 therapeutic exercise, 97530 therapeutic activity, 97112 neuromuscular re-education, 97140 manual therapy, 97035 ultrasound, 97018 paraffin, 02989 moist heat, 97032 electrical stimulation (manual), passive range of motion, functional mobility training, energy conservation, coping strategies training, patient/family education, and DME and/or AE instructions  CONSULTED AND AGREED WITH PLAN OF CARE: Patient and family member/caregiver  PLAN FOR NEXT SESSION: Manual Therapy as needed, AA/ROM, A/ROM as able, stretches   Sonny Cory, OTR/L  [phone] 02/22/2024, 2:03 PM

## 2024-02-22 NOTE — Telephone Encounter (Signed)
Pt made aware. She has no further concerns.

## 2024-02-22 NOTE — Telephone Encounter (Signed)
 Cardizem  currently has a different providers name on it-maybe from when they were hospitalized. Ok to refill?

## 2024-02-25 ENCOUNTER — Encounter (HOSPITAL_COMMUNITY): Payer: Self-pay | Admitting: Occupational Therapy

## 2024-02-25 ENCOUNTER — Ambulatory Visit (HOSPITAL_COMMUNITY): Admitting: Occupational Therapy

## 2024-02-25 DIAGNOSIS — M25621 Stiffness of right elbow, not elsewhere classified: Secondary | ICD-10-CM | POA: Diagnosis not present

## 2024-02-25 DIAGNOSIS — M25631 Stiffness of right wrist, not elsewhere classified: Secondary | ICD-10-CM

## 2024-02-25 DIAGNOSIS — M25531 Pain in right wrist: Secondary | ICD-10-CM | POA: Diagnosis not present

## 2024-02-25 DIAGNOSIS — M25611 Stiffness of right shoulder, not elsewhere classified: Secondary | ICD-10-CM | POA: Diagnosis not present

## 2024-02-25 DIAGNOSIS — M25521 Pain in right elbow: Secondary | ICD-10-CM | POA: Diagnosis not present

## 2024-02-25 DIAGNOSIS — R29898 Other symptoms and signs involving the musculoskeletal system: Secondary | ICD-10-CM | POA: Diagnosis not present

## 2024-02-25 DIAGNOSIS — M25511 Pain in right shoulder: Secondary | ICD-10-CM

## 2024-02-25 NOTE — Therapy (Signed)
 OUTPATIENT OCCUPATIONAL THERAPY ORTHO TREATMENT  Patient Name: Krystal [name] MRN: [id number] DOB:[birth year], 83 y.o., female Today's Date: 02/25/2024    END OF SESSION:  OT End of Session - 02/25/24 1623     Visit Number 3    Number of Visits 9    Date for OT Re-Evaluation 03/18/24    Authorization Type [employer]    OT Start Time 1532    OT Stop Time 1612    OT Time Calculation (min) 40 min    Activity Tolerance Patient tolerated treatment well    Behavior During Therapy WFL for tasks assessed/performed          Past Medical History:  Diagnosis Date   Anemia    Asthma    Brain tumor (HCC)    1986   Breast cancer (HCC)    Right breast   Cataract    Colon polyps    COPD (chronic obstructive pulmonary disease) (HCC)    Hiatal hernia    Hyperlipidemia    Macular degeneration    Osteoporosis    Pneumonia    PVD (posterior vitreous detachment)    Right knee pain    Shingles rash    Past Surgical History:  Procedure Laterality Date   CATARACT EXTRACTION, BILATERAL     COLONOSCOPY     CORONARY PRESSURE/FFR WITH 3D MAPPING N/A 03/21/2021   Procedure: Coronary Pressure Wire/FFR w/3D Mapping;  Surgeon: Mady Bruckner, MD;  Location: [facility];  Service: Cardiovascular;  Laterality: N/A;   CRANIECTOMY / CRANIOTOMY FOR EXCISION OF BRAIN TUMOR     Left side brain   LEFT HEART CATH AND CORONARY ANGIOGRAPHY N/A 03/21/2021   Procedure: LEFT HEART CATH AND CORONARY ANGIOGRAPHY;  Surgeon: Mady Bruckner, MD;  Location: [facility];  Service: Cardiovascular;  Laterality: N/A;   MASTECTOMY Right    ORIF HUMERUS FRACTURE Right 08/17/2023   Procedure: OPEN REDUCTION INTERNAL FIXATION (ORIF) DISTAL HUMERUS FRACTURE;  Surgeon: Kendal Franky SQUIBB, MD;  Location: [facility];  Service: Orthopedics;  Laterality: Right;   PARTIAL HYSTERECTOMY     POLYPECTOMY     SKIN CANCER EXCISION Left    Under left eye   Patient Active Problem List   Diagnosis Date Noted    Open fracture of distal end of humerus 08/17/2023   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   Hypoalbuminemia due to protein-calorie malnutrition (HCC) 08/17/2023   Atrial fibrillation, chronic (HCC) 08/17/2023   Open fracture of distal end of right humerus, unspecified fracture morphology, initial encounter 08/16/2023   Chronic combined systolic and diastolic heart failure (HCC) 03/16/2023   Nonrheumatic aortic valve insufficiency 05/13/2022   COPD (chronic obstructive pulmonary disease) (HCC) 04/23/2021   Gait instability 03/28/2021   Chronic right SI joint pain 03/28/2021   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   PAF (paroxysmal atrial fibrillation) (HCC) 03/28/2021   Unstable angina (HCC)    CAD (coronary artery disease) 03/20/2021   Osteoporosis 03/24/2019   Abdominal aortic atherosclerosis (HCC) 04/10/2015   PSVT (paroxysmal supraventricular tachycardia) (HCC) 05/10/2014   Macular degeneration, age related, nonexudative 08/24/2013   History of right mastectomy 07/13/2013   Anemia, iron deficiency 06/21/2013   Allergic rhinitis 06/21/2013   Chronic low back pain 06/21/2013   Malignant neoplasm of female breast (HCC) 11/22/2007   Mixed hyperlipidemia 11/22/2007   Anxiety state 11/22/2007   Depression, recurrent (HCC) 11/22/2007   Asthma 11/22/2007   GERD 11/22/2007   Diaphragmatic hernia 08/14/2007   Hiatal hernia 08/14/2007   ADENOMATOUS COLONIC  POLYP 02/23/2007   PCP: Maryanne Chew, MD  REFERRING PROVIDER: Danton Domino, PA-C  ONSET DATE: 08/16/23  REFERRING DIAG: Radial Nerve Palsy  THERAPY DIAG:  Right shoulder pain, unspecified chronicity  Shoulder stiffness, right  Pain in right wrist  Stiffness of right wrist, not elsewhere classified  Stiffness of right elbow, not elsewhere classified  Other symptoms and signs involving the musculoskeletal system  Rationale for Evaluation and Treatment: Rehabilitation  SUBJECTIVE:   SUBJECTIVE STATEMENT: S: It's about the same.    PERTINENT HISTORY: Pt had a fall 08/16/23 where she fractured her radius. Pt required ORIF of radius. Pt spent 3 weeks in inpatient rehab, 1 month in home health, and 1 month in outpatient rehab.   PRECAUTIONS: Fall  WEIGHT BEARING RESTRICTIONS: Due to palsy she is not allowed to lift weight.   PAIN:  Are you having pain? No  FALLS: Has patient fallen in last 6 months? Yes. Number of falls 3  LIVING ENVIRONMENT: Lives with: lives with their family Lives in: House/apartment  PLOF: Independent with basic ADLs  PATIENT GOALS: To get her hand moving  NEXT MD VISIT: 02/29/24  OBJECTIVE:  Note: Objective measures were completed at Evaluation unless otherwise noted.  HAND DOMINANCE: Left  ADLs: Overall ADLs: Pt unable to complete any fine motor tasks such as zippers, buttons, fasteners. She is unable to cook for herself or clean. Pt not able to lift anything.   FUNCTIONAL OUTCOME MEASURES: Quick Dash: 75  UPPER EXTREMITY ROM:     Active ROM Right eval  Shoulder flexion 87  Shoulder abduction 78  Shoulder internal rotation 90  Shoulder external rotation 20  Elbow flexion 134  Elbow extension 7  Wrist flexion 40  Wrist extension 8  Wrist ulnar deviation 44  Wrist radial deviation -1  Wrist pronation WFL  Wrist supination 70  (Blank rows = not tested)  Active ROM Right eval  Thumb MCP (0-60) 30  Thumb IP (0-80) 35  Thumb Opposition to Small Finger 4.5cm  Index MCP (0-90) 70  Index PIP (0-100) 80  Index DIP (0-70) 50   Long MCP (0-90)  70  Long PIP (0-100)  80  Long DIP (0-70)  55  Ring MCP (0-90)  75  Ring PIP (0-100)  85  Ring DIP (0-70)  60  Little MCP (0-90)  80  Little PIP (0-100)  95  Little DIP (0-70)  65  (Blank rows = not tested)   UPPER EXTREMITY MMT:     MMT Right eval  Shoulder flexion 4-/5  Shoulder abduction 4-/5  Shoulder internal rotation 4/5  Shoulder external rotation 4/5  Elbow flexion 3+/5  Elbow extension 4/5  Wrist flexion  3-/5  Wrist extension 3-/5  Wrist ulnar deviation 3/5  Wrist radial deviation 2/5  Wrist pronation 3/5  Wrist supination 3/5  (Blank rows = not tested)  HAND FUNCTION: Grip strength: Right: 14 lbs; Left: 49 lbs, Lateral pinch: Right: 3 lbs, Left: 9 lbs, and 3 point pinch: Right: unable lbs, Left: 9 lbs  COORDINATION: Box and Blocks:  Right 3blocks, Left 38blocks  SENSATION: Light touch: Impaired   OBSERVATIONS: Decreased attention and problem solving, requiring increased cuing.    TREATMENT DATE:  02/25/24 -Table Slides: flexion, abduction, x10 -Shoulder shrugs: x10 -Scapular Retraction, x10 -Wrist ROM: extension, flexion, ulnar/radial deviation, supination/pronation, x10 -Thumb ROM: abduction, flexion at MCP, opposition, x10  -Digit ROM: abduction, taps (max difficulty) -Large peg board: pulling out 10 pegs out of board (Mod difficulty) -  contrasting colors for vision -Therabar: yellow bar, radial bends, ulnar bends, x10 (assist for stabilization at wrist to maintain neutral), mod difficulty  02/22/24 -AA/ROM shoulder: sitting-protraction, flexion, 10 reps -A/ROM: shoulder-horizontal abduction, 10 reps -A/ROM: wrist-extension, flexion, ulnar/radial deviation, forearm pronation/supination, 10 reps -Table slides: flexion, 10 reps -Digit A/ROM: thumb abduction/adduction, 10 reps -Sponges: 3, 4, 4 -Towel crumple: 10 reps  -Digit abduction/adduction: 10 reps -Finger taps: 10 reps-max difficulty -Theraputty: yellow-flatten, pushing pvc pipe into putty to cut circles, rolling, gripping -UBE: level 1, 2' forward, 2' reverse, pace: 4.0       PATIENT EDUCATION: Education details: Thumb ROM Person educated: Patient Education method: Explanation and Demonstration Education comprehension: verbalized understanding and returned demonstration  HOME EXERCISE PROGRAM: 02/22/24: wrist A/ROM 7/24: thumb ROM    GOALS: Goals reviewed with patient? Yes   LONG TERM GOALS: Target  date: 03/18/24  Pt provided and educated on HEP for RUE mobility in order to improve ADL independence.   Goal status: IN PROGRESS  2.  Pt will improve RUE shoulder ROM by 30* and wrist ROM by 20* in order to reach overhead and behind her back, as well as opening doors.   Goal status:  IN PROGRESS  3.  Pt will improve RUE strength to 4/5 in order to lift and carry items during light house work and meal prep.    Goal status:  IN PROGRESS  4.  Pt will improve grip strength by 15# and pinch strength by 4# in order to grip and hold items during dressing and grooming.  Goal status:  IN PROGRESS  5.  Pt will improve RUE coordination by completing 20 blocks in 1' with box and blocks in order to complete buckles, fasteners, and buttons with compensatory strategies.   Goal status: IN PROGRESS   ASSESSMENT:  CLINICAL IMPRESSION:  Pt continues to have max deficits with wrist and grip mobility. No pain reported, only weakness and numbness. OT providing visual demonstration with each task, then hand over hand demonstration to attempt correct technique and positioning. She additionally required mod to max support with most wrist and hand tasks to limit flexion and ulnar deviation when trying to maintain neutral.   PERFORMANCE DEFICITS: in functional skills including ADLs, IADLs, sensation, edema, ROM, strength, pain, fascial restrictions, Fine motor control, Gross motor control, body mechanics, and UE functional use.      PLAN:  OT FREQUENCY: 2x/week  OT DURATION: 4 weeks  PLANNED INTERVENTIONS: 97168 OT Re-evaluation, 97535 self care/ADL training, 02889 therapeutic exercise, 97530 therapeutic activity, 97112 neuromuscular re-education, 97140 manual therapy, 97035 ultrasound, 97018 paraffin, 02989 moist heat, 97032 electrical stimulation (manual), passive range of motion, functional mobility training, energy conservation, coping strategies training, patient/family education, and DME and/or  AE instructions  CONSULTED AND AGREED WITH PLAN OF CARE: Patient and family member/caregiver  PLAN FOR NEXT SESSION: Manual Therapy as needed, AA/ROM, A/ROM as able, stretches   Valentin Nightingale, OTR/L [phone] 02/25/2024, 4:24 PM

## 2024-02-27 DIAGNOSIS — S42201A Unspecified fracture of upper end of right humerus, initial encounter for closed fracture: Secondary | ICD-10-CM | POA: Diagnosis not present

## 2024-03-01 ENCOUNTER — Encounter (HOSPITAL_COMMUNITY): Admitting: Occupational Therapy

## 2024-03-01 DIAGNOSIS — M25531 Pain in right wrist: Secondary | ICD-10-CM | POA: Diagnosis not present

## 2024-03-01 DIAGNOSIS — S143XXA Injury of brachial plexus, initial encounter: Secondary | ICD-10-CM | POA: Diagnosis not present

## 2024-03-03 ENCOUNTER — Ambulatory Visit (HOSPITAL_COMMUNITY): Admitting: Occupational Therapy

## 2024-03-03 ENCOUNTER — Encounter (HOSPITAL_COMMUNITY): Payer: Self-pay | Admitting: Occupational Therapy

## 2024-03-03 DIAGNOSIS — M25621 Stiffness of right elbow, not elsewhere classified: Secondary | ICD-10-CM | POA: Diagnosis not present

## 2024-03-03 DIAGNOSIS — M25631 Stiffness of right wrist, not elsewhere classified: Secondary | ICD-10-CM | POA: Diagnosis not present

## 2024-03-03 DIAGNOSIS — M25511 Pain in right shoulder: Secondary | ICD-10-CM | POA: Diagnosis not present

## 2024-03-03 DIAGNOSIS — R29898 Other symptoms and signs involving the musculoskeletal system: Secondary | ICD-10-CM

## 2024-03-03 DIAGNOSIS — M25531 Pain in right wrist: Secondary | ICD-10-CM

## 2024-03-03 DIAGNOSIS — M25611 Stiffness of right shoulder, not elsewhere classified: Secondary | ICD-10-CM

## 2024-03-03 DIAGNOSIS — M25521 Pain in right elbow: Secondary | ICD-10-CM | POA: Diagnosis not present

## 2024-03-03 NOTE — Therapy (Signed)
 OUTPATIENT OCCUPATIONAL THERAPY ORTHO TREATMENT  Patient Name: Krystal [name] MRN: [id number] DOB:04/14/1941, 83 y.o., female Today's Date: 03/03/2024    END OF SESSION:  OT End of Session - 03/03/24 1522     Visit Number 4    Number of Visits 9    Date for OT Re-Evaluation 03/18/24    Authorization Type [employer]    OT Start Time 1438    OT Stop Time 1522    OT Time Calculation (min) 44 min    Activity Tolerance Patient tolerated treatment well    Behavior During Therapy WFL for tasks assessed/performed           Past Medical History:  Diagnosis Date   Anemia    Asthma    Brain tumor (HCC)    1986   Breast cancer (HCC)    Right breast   Cataract    Colon polyps    COPD (chronic obstructive pulmonary disease) (HCC)    Hiatal hernia    Hyperlipidemia    Macular degeneration    Osteoporosis    Pneumonia    PVD (posterior vitreous detachment)    Right knee pain    Shingles rash    Past Surgical History:  Procedure Laterality Date   CATARACT EXTRACTION, BILATERAL     COLONOSCOPY     CORONARY PRESSURE/FFR WITH 3D MAPPING N/A 03/21/2021   Procedure: Coronary Pressure Wire/FFR w/3D Mapping;  Surgeon: Mady Bruckner, MD;  Location: [facility];  Service: Cardiovascular;  Laterality: N/A;   CRANIECTOMY / CRANIOTOMY FOR EXCISION OF BRAIN TUMOR     Left side brain   LEFT HEART CATH AND CORONARY ANGIOGRAPHY N/A 03/21/2021   Procedure: LEFT HEART CATH AND CORONARY ANGIOGRAPHY;  Surgeon: Mady Bruckner, MD;  Location: [facility];  Service: Cardiovascular;  Laterality: N/A;   MASTECTOMY Right    ORIF HUMERUS FRACTURE Right 08/17/2023   Procedure: OPEN REDUCTION INTERNAL FIXATION (ORIF) DISTAL HUMERUS FRACTURE;  Surgeon: Kendal Franky SQUIBB, MD;  Location: [facility];  Service: Orthopedics;  Laterality: Right;   PARTIAL HYSTERECTOMY     POLYPECTOMY     SKIN CANCER EXCISION Left    Under left eye   Patient Active Problem List   Diagnosis Date Noted    Open fracture of distal end of humerus 08/17/2023   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   Hypoalbuminemia due to protein-calorie malnutrition (HCC) 08/17/2023   Atrial fibrillation, chronic (HCC) 08/17/2023   Open fracture of distal end of right humerus, unspecified fracture morphology, initial encounter 08/16/2023   Chronic combined systolic and diastolic heart failure (HCC) 03/16/2023   Nonrheumatic aortic valve insufficiency 05/13/2022   COPD (chronic obstructive pulmonary disease) (HCC) 04/23/2021   Gait instability 03/28/2021   Chronic right SI joint pain 03/28/2021   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   PAF (paroxysmal atrial fibrillation) (HCC) 03/28/2021   Unstable angina (HCC)    CAD (coronary artery disease) 03/20/2021   Osteoporosis 03/24/2019   Abdominal aortic atherosclerosis (HCC) 04/10/2015   PSVT (paroxysmal supraventricular tachycardia) (HCC) 05/10/2014   Macular degeneration, age related, nonexudative 08/24/2013   History of right mastectomy 07/13/2013   Anemia, iron deficiency 06/21/2013   Allergic rhinitis 06/21/2013   Chronic low back pain 06/21/2013   Malignant neoplasm of female breast (HCC) 11/22/2007   Mixed hyperlipidemia 11/22/2007   Anxiety state 11/22/2007   Depression, recurrent (HCC) 11/22/2007   Asthma 11/22/2007   GERD 11/22/2007   Diaphragmatic hernia 08/14/2007   Hiatal hernia 08/14/2007   ADENOMATOUS  COLONIC POLYP 02/23/2007   PCP: Maryanne Chew, MD  REFERRING PROVIDER: Danton Domino, PA-C  ONSET DATE: 08/16/23  REFERRING DIAG: Radial Nerve Palsy  THERAPY DIAG:  Right shoulder pain, unspecified chronicity  Shoulder stiffness, right  Pain in right wrist  Stiffness of right wrist, not elsewhere classified  Stiffness of right elbow, not elsewhere classified  Other symptoms and signs involving the musculoskeletal system  Rationale for Evaluation and Treatment: Rehabilitation  SUBJECTIVE:   SUBJECTIVE STATEMENT: S: It isn't working right    PERTINENT HISTORY: Pt had a fall 08/16/23 where she fractured her radius. Pt required ORIF of radius. Pt spent 3 weeks in inpatient rehab, 1 month in home health, and 1 month in outpatient rehab.   PRECAUTIONS: Fall  WEIGHT BEARING RESTRICTIONS: Due to palsy she is not allowed to lift weight.   PAIN:  Are you having pain? No  FALLS: Has patient fallen in last 6 months? Yes. Number of falls 3  LIVING ENVIRONMENT: Lives with: lives with their family Lives in: House/apartment  PLOF: Independent with basic ADLs  PATIENT GOALS: To get her hand moving  NEXT MD VISIT: 02/29/24  OBJECTIVE:  Note: Objective measures were completed at Evaluation unless otherwise noted.  HAND DOMINANCE: Left  ADLs: Overall ADLs: Pt unable to complete any fine motor tasks such as zippers, buttons, fasteners. She is unable to cook for herself or clean. Pt not able to lift anything.   FUNCTIONAL OUTCOME MEASURES: Quick Dash: 75  UPPER EXTREMITY ROM:     Active ROM Right eval  Shoulder flexion 87  Shoulder abduction 78  Shoulder internal rotation 90  Shoulder external rotation 20  Elbow flexion 134  Elbow extension 7  Wrist flexion 40  Wrist extension 8  Wrist ulnar deviation 44  Wrist radial deviation -1  Wrist pronation WFL  Wrist supination 70  (Blank rows = not tested)  Active ROM Right eval  Thumb MCP (0-60) 30  Thumb IP (0-80) 35  Thumb Opposition to Small Finger 4.5cm  Index MCP (0-90) 70  Index PIP (0-100) 80  Index DIP (0-70) 50   Long MCP (0-90)  70  Long PIP (0-100)  80  Long DIP (0-70)  55  Ring MCP (0-90)  75  Ring PIP (0-100)  85  Ring DIP (0-70)  60  Little MCP (0-90)  80  Little PIP (0-100)  95  Little DIP (0-70)  65  (Blank rows = not tested)   UPPER EXTREMITY MMT:     MMT Right eval  Shoulder flexion 4-/5  Shoulder abduction 4-/5  Shoulder internal rotation 4/5  Shoulder external rotation 4/5  Elbow flexion 3+/5  Elbow extension 4/5  Wrist flexion  3-/5  Wrist extension 3-/5  Wrist ulnar deviation 3/5  Wrist radial deviation 2/5  Wrist pronation 3/5  Wrist supination 3/5  (Blank rows = not tested)  HAND FUNCTION: Grip strength: Right: 14 lbs; Left: 49 lbs, Lateral pinch: Right: 3 lbs, Left: 9 lbs, and 3 point pinch: Right: unable lbs, Left: 9 lbs  COORDINATION: Box and Blocks:  Right 3blocks, Left 38blocks  SENSATION: Light touch: Impaired   OBSERVATIONS: Decreased attention and problem solving, requiring increased cuing.    TREATMENT DATE:  03/03/24 -[place] Rolls: flexion/extension, supination/pronation, ulnar/radial twists, x12 each -Wrist ROM: flexion, extension, ulnar/radial deviation, supination/pronation, x15 -Theraputty: yellow putty, roll into a ball, flatten into a pancake, tripod pinch x10, lateral pinch x10, roll into ball, squeeze x10 -Thumb ROM: abduction, flexion at MCP, opposition, x10  -  Digit ROM: abduction, taps, opposition, x10 (max difficulty) -Attempted Perfection game, however due to sensation and vision, pt unable to complete  02/25/24 -Table Slides: flexion, abduction, x10 -Shoulder shrugs: x10 -Scapular Retraction, x10 -Wrist ROM: extension, flexion, ulnar/radial deviation, supination/pronation, x10 -Thumb ROM: abduction, flexion at MCP, opposition, x10  -Digit ROM: abduction, taps (max difficulty) -Large peg board: pulling out 10 pegs out of board (Mod difficulty) - contrasting colors for vision -Therabar: yellow bar, radial bends, ulnar bends, x10 (assist for stabilization at wrist to maintain neutral), mod difficulty  02/22/24 -AA/ROM shoulder: sitting-protraction, flexion, 10 reps -A/ROM: shoulder-horizontal abduction, 10 reps -A/ROM: wrist-extension, flexion, ulnar/radial deviation, forearm pronation/supination, 10 reps -Table slides: flexion, 10 reps -Digit A/ROM: thumb abduction/adduction, 10 reps -Sponges: 3, 4, 4 -Towel crumple: 10 reps  -Digit abduction/adduction: 10 reps -Finger  taps: 10 reps-max difficulty -Theraputty: yellow-flatten, pushing pvc pipe into putty to cut circles, rolling, gripping -UBE: level 1, 2' forward, 2' reverse, pace: 4.0       PATIENT EDUCATION: Education details: Continue HEP Person educated: Patient Education method: Explanation and Demonstration Education comprehension: verbalized understanding and returned demonstration  HOME EXERCISE PROGRAM: 02/22/24: wrist A/ROM 7/24: thumb ROM    GOALS: Goals reviewed with patient? Yes   LONG TERM GOALS: Target date: 03/18/24  Pt provided and educated on HEP for RUE mobility in order to improve ADL independence.   Goal status: IN PROGRESS  2.  Pt will improve RUE shoulder ROM by 30* and wrist ROM by 20* in order to reach overhead and behind her back, as well as opening doors.   Goal status:  IN PROGRESS  3.  Pt will improve RUE strength to 4/5 in order to lift and carry items during light house work and meal prep.    Goal status:  IN PROGRESS  4.  Pt will improve grip strength by 15# and pinch strength by 4# in order to grip and hold items during dressing and grooming.  Goal status:  IN PROGRESS  5.  Pt will improve RUE coordination by completing 20 blocks in 1' with box and blocks in order to complete buckles, fasteners, and buttons with compensatory strategies.   Goal status: IN PROGRESS   ASSESSMENT:  CLINICAL IMPRESSION:  Pt continues to have max deficits with wrist and grip mobility. She is deviating towards the ulnar side during all wrist extension and grip flexion. OT providing stabilization at wrist throughout entire session, as well as hand over hand demonstration for all tasks due to vision and poor motor control.   PERFORMANCE DEFICITS: in functional skills including ADLs, IADLs, sensation, edema, ROM, strength, pain, fascial restrictions, Fine motor control, Gross motor control, body mechanics, and UE functional use.      PLAN:  OT FREQUENCY: 2x/week  OT  DURATION: 4 weeks  PLANNED INTERVENTIONS: 97168 OT Re-evaluation, 97535 self care/ADL training, 02889 therapeutic exercise, 97530 therapeutic activity, 97112 neuromuscular re-education, 97140 manual therapy, 97035 ultrasound, 97018 paraffin, 02989 moist heat, 97032 electrical stimulation (manual), passive range of motion, functional mobility training, energy conservation, coping strategies training, patient/family education, and DME and/or AE instructions  CONSULTED AND AGREED WITH PLAN OF CARE: Patient and family member/caregiver  PLAN FOR NEXT SESSION: Manual Therapy as needed, AA/ROM, A/ROM as able, stretches   Valentin Nightingale, OTR/L [phone] 03/03/2024, 4:31 PM

## 2024-03-07 ENCOUNTER — Encounter (HOSPITAL_COMMUNITY): Admitting: Occupational Therapy

## 2024-03-07 DIAGNOSIS — H353113 Nonexudative age-related macular degeneration, right eye, advanced atrophic without subfoveal involvement: Secondary | ICD-10-CM | POA: Diagnosis not present

## 2024-03-07 DIAGNOSIS — H43813 Vitreous degeneration, bilateral: Secondary | ICD-10-CM | POA: Diagnosis not present

## 2024-03-07 DIAGNOSIS — H43393 Other vitreous opacities, bilateral: Secondary | ICD-10-CM | POA: Diagnosis not present

## 2024-03-07 DIAGNOSIS — H35033 Hypertensive retinopathy, bilateral: Secondary | ICD-10-CM | POA: Diagnosis not present

## 2024-03-07 DIAGNOSIS — Z961 Presence of intraocular lens: Secondary | ICD-10-CM | POA: Diagnosis not present

## 2024-03-07 DIAGNOSIS — H353124 Nonexudative age-related macular degeneration, left eye, advanced atrophic with subfoveal involvement: Secondary | ICD-10-CM | POA: Diagnosis not present

## 2024-03-07 DIAGNOSIS — H35373 Puckering of macula, bilateral: Secondary | ICD-10-CM | POA: Diagnosis not present

## 2024-03-07 DIAGNOSIS — H353211 Exudative age-related macular degeneration, right eye, with active choroidal neovascularization: Secondary | ICD-10-CM | POA: Diagnosis not present

## 2024-03-09 ENCOUNTER — Encounter (HOSPITAL_COMMUNITY): Payer: Self-pay | Admitting: Occupational Therapy

## 2024-03-09 ENCOUNTER — Ambulatory Visit (HOSPITAL_COMMUNITY): Attending: Student | Admitting: Occupational Therapy

## 2024-03-09 DIAGNOSIS — M25621 Stiffness of right elbow, not elsewhere classified: Secondary | ICD-10-CM | POA: Diagnosis not present

## 2024-03-09 DIAGNOSIS — M25611 Stiffness of right shoulder, not elsewhere classified: Secondary | ICD-10-CM | POA: Insufficient documentation

## 2024-03-09 DIAGNOSIS — M25511 Pain in right shoulder: Secondary | ICD-10-CM | POA: Insufficient documentation

## 2024-03-09 DIAGNOSIS — M25531 Pain in right wrist: Secondary | ICD-10-CM | POA: Diagnosis not present

## 2024-03-09 DIAGNOSIS — M25631 Stiffness of right wrist, not elsewhere classified: Secondary | ICD-10-CM | POA: Insufficient documentation

## 2024-03-09 DIAGNOSIS — R29898 Other symptoms and signs involving the musculoskeletal system: Secondary | ICD-10-CM | POA: Diagnosis not present

## 2024-03-09 DIAGNOSIS — M25521 Pain in right elbow: Secondary | ICD-10-CM | POA: Diagnosis not present

## 2024-03-09 NOTE — Therapy (Unsigned)
 OUTPATIENT OCCUPATIONAL THERAPY ORTHO TREATMENT  Patient Name: Krystal [name] MRN: [id number] DOB:30-May-1941, 83 y.o., female Today's Date: 03/10/2024    END OF SESSION:  OT End of Session - 03/09/24 1345     Visit Number 5    Number of Visits 9    Date for OT Re-Evaluation 03/18/24    Authorization Type [employer]    OT Start Time 1307    OT Stop Time 1345    OT Time Calculation (min) 38 min    Activity Tolerance Patient tolerated treatment well    Behavior During Therapy WFL for tasks assessed/performed            Past Medical History:  Diagnosis Date   Anemia    Asthma    Brain tumor (HCC)    1986   Breast cancer (HCC)    Right breast   Cataract    Colon polyps    COPD (chronic obstructive pulmonary disease) (HCC)    Hiatal hernia    Hyperlipidemia    Macular degeneration    Osteoporosis    Pneumonia    PVD (posterior vitreous detachment)    Right knee pain    Shingles rash    Past Surgical History:  Procedure Laterality Date   CATARACT EXTRACTION, BILATERAL     COLONOSCOPY     CORONARY PRESSURE/FFR WITH 3D MAPPING N/A 03/21/2021   Procedure: Coronary Pressure Wire/FFR w/3D Mapping;  Surgeon: Mady Bruckner, MD;  Location: [facility];  Service: Cardiovascular;  Laterality: N/A;   CRANIECTOMY / CRANIOTOMY FOR EXCISION OF BRAIN TUMOR     Left side brain   LEFT HEART CATH AND CORONARY ANGIOGRAPHY N/A 03/21/2021   Procedure: LEFT HEART CATH AND CORONARY ANGIOGRAPHY;  Surgeon: Mady Bruckner, MD;  Location: [facility];  Service: Cardiovascular;  Laterality: N/A;   MASTECTOMY Right    ORIF HUMERUS FRACTURE Right 08/17/2023   Procedure: OPEN REDUCTION INTERNAL FIXATION (ORIF) DISTAL HUMERUS FRACTURE;  Surgeon: Kendal Franky SQUIBB, MD;  Location: [facility];  Service: Orthopedics;  Laterality: Right;   PARTIAL HYSTERECTOMY     POLYPECTOMY     SKIN CANCER EXCISION Left    Under left eye   Patient Active Problem List   Diagnosis Date Noted    Open fracture of distal end of humerus 08/17/2023   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   Hypoalbuminemia due to protein-calorie malnutrition (HCC) 08/17/2023   Atrial fibrillation, chronic (HCC) 08/17/2023   Open fracture of distal end of right humerus, unspecified fracture morphology, initial encounter 08/16/2023   Chronic combined systolic and diastolic heart failure (HCC) 03/16/2023   Nonrheumatic aortic valve insufficiency 05/13/2022   COPD (chronic obstructive pulmonary disease) (HCC) 04/23/2021   Gait instability 03/28/2021   Chronic right SI joint pain 03/28/2021   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   PAF (paroxysmal atrial fibrillation) (HCC) 03/28/2021   Unstable angina (HCC)    CAD (coronary artery disease) 03/20/2021   Osteoporosis 03/24/2019   Abdominal aortic atherosclerosis (HCC) 04/10/2015   PSVT (paroxysmal supraventricular tachycardia) (HCC) 05/10/2014   Macular degeneration, age related, nonexudative 08/24/2013   History of right mastectomy 07/13/2013   Anemia, iron deficiency 06/21/2013   Allergic rhinitis 06/21/2013   Chronic low back pain 06/21/2013   Malignant neoplasm of female breast (HCC) 11/22/2007   Mixed hyperlipidemia 11/22/2007   Anxiety state 11/22/2007   Depression, recurrent (HCC) 11/22/2007   Asthma 11/22/2007   GERD 11/22/2007   Diaphragmatic hernia 08/14/2007   Hiatal hernia 08/14/2007  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   PCP: Maryanne Chew, MD  REFERRING PROVIDER: Danton Domino, PA-C  ONSET DATE: 08/16/23  REFERRING DIAG: Radial Nerve Palsy  THERAPY DIAG:  Right shoulder pain, unspecified chronicity  Shoulder stiffness, right  Pain in right wrist  Stiffness of right wrist, not elsewhere classified  Stiffness of right elbow, not elsewhere classified  Pain in right elbow  Other symptoms and signs involving the musculoskeletal system  Rationale for Evaluation and Treatment: Rehabilitation  SUBJECTIVE:   SUBJECTIVE STATEMENT: S:  It isn't working right   PERTINENT HISTORY: Pt had a fall 08/16/23 where she fractured her radius. Pt required ORIF of radius. Pt spent 3 weeks in inpatient rehab, 1 month in home health, and 1 month in outpatient rehab.   PRECAUTIONS: Fall  WEIGHT BEARING RESTRICTIONS: Due to palsy she is not allowed to lift weight.   PAIN:  Are you having pain? No  FALLS: Has patient fallen in last 6 months? Yes. Number of falls 3  LIVING ENVIRONMENT: Lives with: lives with their family Lives in: House/apartment  PLOF: Independent with basic ADLs  PATIENT GOALS: To get her hand moving  NEXT MD VISIT: 02/29/24  OBJECTIVE:  Note: Objective measures were completed at Evaluation unless otherwise noted.  HAND DOMINANCE: Left  ADLs: Overall ADLs: Pt unable to complete any fine motor tasks such as zippers, buttons, fasteners. She is unable to cook for herself or clean. Pt not able to lift anything.   FUNCTIONAL OUTCOME MEASURES: Quick Dash: 75  UPPER EXTREMITY ROM:     Active ROM Right eval  Shoulder flexion 87  Shoulder abduction 78  Shoulder internal rotation 90  Shoulder external rotation 20  Elbow flexion 134  Elbow extension 7  Wrist flexion 40  Wrist extension 8  Wrist ulnar deviation 44  Wrist radial deviation -1  Wrist pronation WFL  Wrist supination 70  (Blank rows = not tested)  Active ROM Right eval  Thumb MCP (0-60) 30  Thumb IP (0-80) 35  Thumb Opposition to Small Finger 4.5cm  Index MCP (0-90) 70  Index PIP (0-100) 80  Index DIP (0-70) 50   Long MCP (0-90)  70  Long PIP (0-100)  80  Long DIP (0-70)  55  Ring MCP (0-90)  75  Ring PIP (0-100)  85  Ring DIP (0-70)  60  Little MCP (0-90)  80  Little PIP (0-100)  95  Little DIP (0-70)  65  (Blank rows = not tested)   UPPER EXTREMITY MMT:     MMT Right eval  Shoulder flexion 4-/5  Shoulder abduction 4-/5  Shoulder internal rotation 4/5  Shoulder external rotation 4/5  Elbow flexion 3+/5  Elbow  extension 4/5  Wrist flexion 3-/5  Wrist extension 3-/5  Wrist ulnar deviation 3/5  Wrist radial deviation 2/5  Wrist pronation 3/5  Wrist supination 3/5  (Blank rows = not tested)  HAND FUNCTION: Grip strength: Right: 14 lbs; Left: 49 lbs, Lateral pinch: Right: 3 lbs, Left: 9 lbs, and 3 point pinch: Right: unable lbs, Left: 9 lbs  COORDINATION: Box and Blocks:  Right 3blocks, Left 38blocks  SENSATION: Light touch: Impaired   OBSERVATIONS: Decreased attention and problem solving, requiring increased cuing.    TREATMENT DATE:  03/09/24 -Wrist ROM: flexion, extension, ulnar/radial deviation, supination/pronation, x15 -Wrist strengthening: 1#, flexion, extension, ulnar/radial deviation, supination/pronation, x15 -max assist for all movements -Grasping and lifting: attempted green weighted ball - unable, squish cone x10 with wrist stabilization -Therabar: attempted radial  bend, pronation bend, and flexion twist - unable to achieve any bend despite max wrist stabilization -Theraputty: red putty, roll into ball, flatten into pancake, grasping PVC pipe to push circles into putty, roll into log, tripod pinch, lateral pinch, roll into ball, squeeze x6  03/03/24 -Ball Rolls: flexion/extension, supination/pronation, ulnar/radial twists, x12 each -Wrist ROM: flexion, extension, ulnar/radial deviation, supination/pronation, x15 -Theraputty: yellow putty, roll into a ball, flatten into a pancake, tripod pinch x10, lateral pinch x10, roll into ball, squeeze x10 -Thumb ROM: abduction, flexion at MCP, opposition, x10  -Digit ROM: abduction, taps, opposition, x10 (max difficulty) -Attempted Perfection game, however due to sensation and vision, pt unable to complete  02/25/24 -Table Slides: flexion, abduction, x10 -Shoulder shrugs: x10 -Scapular Retraction, x10 -Wrist ROM: extension, flexion, ulnar/radial deviation, supination/pronation, x10 -Thumb ROM: abduction, flexion at MCP, opposition, x10   -Digit ROM: abduction, taps (max difficulty) -Large peg board: pulling out 10 pegs out of board (Mod difficulty) - contrasting colors for vision -Therabar: yellow bar, radial bends, ulnar bends, x10 (assist for stabilization at wrist to maintain neutral), mod difficulty   PATIENT EDUCATION: Education details: Continue HEP Person educated: Patient Education method: Medical illustrator Education comprehension: verbalized understanding and returned demonstration  HOME EXERCISE PROGRAM: 02/22/24: wrist A/ROM 7/24: thumb ROM    GOALS: Goals reviewed with patient? Yes   LONG TERM GOALS: Target date: 03/18/24  Pt provided and educated on HEP for RUE mobility in order to improve ADL independence.   Goal status: IN PROGRESS  2.  Pt will improve RUE shoulder ROM by 30* and wrist ROM by 20* in order to reach overhead and behind her back, as well as opening doors.   Goal status:  IN PROGRESS  3.  Pt will improve RUE strength to 4/5 in order to lift and carry items during light house work and meal prep.    Goal status:  IN PROGRESS  4.  Pt will improve grip strength by 15# and pinch strength by 4# in order to grip and hold items during dressing and grooming.  Goal status:  IN PROGRESS  5.  Pt will improve RUE coordination by completing 20 blocks in 1' with box and blocks in order to complete buckles, fasteners, and buttons with compensatory strategies.   Goal status: IN PROGRESS   ASSESSMENT:  CLINICAL IMPRESSION:  This session pt having decreased mobility in her wrist and grip. She was unable to pick up a 1 pound ball or a light squishy cone. OT providing max wrist stabilization throughout session, as well as assist for all wrist mobility. Verbal and tactile cuing provided for positioning and technique.   PERFORMANCE DEFICITS: in functional skills including ADLs, IADLs, sensation, edema, ROM, strength, pain, fascial restrictions, Fine motor control, Gross motor  control, body mechanics, and UE functional use.      PLAN:  OT FREQUENCY: 2x/week  OT DURATION: 4 weeks  PLANNED INTERVENTIONS: 97168 OT Re-evaluation, 97535 self care/ADL training, 02889 therapeutic exercise, 97530 therapeutic activity, 97112 neuromuscular re-education, 97140 manual therapy, 97035 ultrasound, 97018 paraffin, 02989 moist heat, 97032 electrical stimulation (manual), passive range of motion, functional mobility training, energy conservation, coping strategies training, patient/family education, and DME and/or AE instructions  CONSULTED AND AGREED WITH PLAN OF CARE: Patient and family member/caregiver  PLAN FOR NEXT SESSION: Manual Therapy as needed, AA/ROM, A/ROM as able, stretches   Valentin Nightingale, OTR/L [phone] 03/10/2024, 12:19 PM

## 2024-03-11 ENCOUNTER — Ambulatory Visit (HOSPITAL_COMMUNITY): Admitting: Occupational Therapy

## 2024-03-11 ENCOUNTER — Encounter (HOSPITAL_COMMUNITY): Payer: Self-pay | Admitting: Occupational Therapy

## 2024-03-11 DIAGNOSIS — M25621 Stiffness of right elbow, not elsewhere classified: Secondary | ICD-10-CM | POA: Diagnosis not present

## 2024-03-11 DIAGNOSIS — M25531 Pain in right wrist: Secondary | ICD-10-CM | POA: Diagnosis not present

## 2024-03-11 DIAGNOSIS — R29898 Other symptoms and signs involving the musculoskeletal system: Secondary | ICD-10-CM

## 2024-03-11 DIAGNOSIS — M25631 Stiffness of right wrist, not elsewhere classified: Secondary | ICD-10-CM | POA: Diagnosis not present

## 2024-03-11 DIAGNOSIS — M25611 Stiffness of right shoulder, not elsewhere classified: Secondary | ICD-10-CM | POA: Diagnosis not present

## 2024-03-11 DIAGNOSIS — M25511 Pain in right shoulder: Secondary | ICD-10-CM | POA: Diagnosis not present

## 2024-03-11 DIAGNOSIS — M25521 Pain in right elbow: Secondary | ICD-10-CM | POA: Diagnosis not present

## 2024-03-11 NOTE — Therapy (Signed)
 OUTPATIENT OCCUPATIONAL THERAPY ORTHO TREATMENT  Patient Name: Krystal [name] MRN: [id number] DOB:05/14/1941, 83 y.o., female Today's Date: 03/11/2024    END OF SESSION:    03/11/24 1343  OT Visits / Re-Eval  Visit Number 6  Number of Visits 9  Date for OT Re-Evaluation 03/18/24  Authorization  Authorization Type [employer]  OT Time Calculation  OT Start Time 1301  OT Stop Time 1343  OT Time Calculation (min) 42 min  End of Session  Activity Tolerance Patient tolerated treatment well  Behavior During Therapy WFL for tasks assessed/performed      Past Medical History:  Diagnosis Date   Anemia    Asthma    Brain tumor (HCC)    1986   Breast cancer (HCC)    Right breast   Cataract    Colon polyps    COPD (chronic obstructive pulmonary disease) (HCC)    Hiatal hernia    Hyperlipidemia    Macular degeneration    Osteoporosis    Pneumonia    PVD (posterior vitreous detachment)    Right knee pain    Shingles rash    Past Surgical History:  Procedure Laterality Date   CATARACT EXTRACTION, BILATERAL     COLONOSCOPY     CORONARY PRESSURE/FFR WITH 3D MAPPING N/A 03/21/2021   Procedure: Coronary Pressure Wire/FFR w/3D Mapping;  Surgeon: Mady Bruckner, MD;  Location: [facility];  Service: Cardiovascular;  Laterality: N/A;   CRANIECTOMY / CRANIOTOMY FOR EXCISION OF BRAIN TUMOR     Left side brain   LEFT HEART CATH AND CORONARY ANGIOGRAPHY N/A 03/21/2021   Procedure: LEFT HEART CATH AND CORONARY ANGIOGRAPHY;  Surgeon: Mady Bruckner, MD;  Location: [facility];  Service: Cardiovascular;  Laterality: N/A;   MASTECTOMY Right    ORIF HUMERUS FRACTURE Right 08/17/2023   Procedure: OPEN REDUCTION INTERNAL FIXATION (ORIF) DISTAL HUMERUS FRACTURE;  Surgeon: Kendal Franky SQUIBB, MD;  Location: [facility];  Service: Orthopedics;  Laterality: Right;   PARTIAL HYSTERECTOMY     POLYPECTOMY     SKIN CANCER EXCISION Left    Under left eye   Patient Active Problem  List   Diagnosis Date Noted   Open fracture of distal end of humerus 08/17/2023   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   Hypoalbuminemia due to protein-calorie malnutrition (HCC) 08/17/2023   Atrial fibrillation, chronic (HCC) 08/17/2023   Open fracture of distal end of right humerus, unspecified fracture morphology, initial encounter 08/16/2023   Chronic combined systolic and diastolic heart failure (HCC) 03/16/2023   Nonrheumatic aortic valve insufficiency 05/13/2022   COPD (chronic obstructive pulmonary disease) (HCC) 04/23/2021   Gait instability 03/28/2021   Chronic right SI joint pain 03/28/2021   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   PAF (paroxysmal atrial fibrillation) (HCC) 03/28/2021   Unstable angina (HCC)    CAD (coronary artery disease) 03/20/2021   Osteoporosis 03/24/2019   Abdominal aortic atherosclerosis (HCC) 04/10/2015   PSVT (paroxysmal supraventricular tachycardia) (HCC) 05/10/2014   Macular degeneration, age related, nonexudative 08/24/2013   History of right mastectomy 07/13/2013   Anemia, iron deficiency 06/21/2013   Allergic rhinitis 06/21/2013   Chronic low back pain 06/21/2013   Malignant neoplasm of female breast (HCC) 11/22/2007   Mixed hyperlipidemia 11/22/2007   Anxiety state 11/22/2007   Depression, recurrent (HCC) 11/22/2007   Asthma 11/22/2007   GERD 11/22/2007   Diaphragmatic hernia 08/14/2007   Hiatal hernia 08/14/2007   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   PCP: Maryanne Chew, MD  REFERRING PROVIDER:  Danton Domino, PA-C  ONSET DATE: 08/16/23  REFERRING DIAG: Radial Nerve Palsy  THERAPY DIAG:  No diagnosis found.  Rationale for Evaluation and Treatment: Rehabilitation  SUBJECTIVE:   SUBJECTIVE STATEMENT: S: I just can't do anything with that hand   PERTINENT HISTORY: Pt had a fall 08/16/23 where she fractured her radius. Pt required ORIF of radius. Pt spent 3 weeks in inpatient rehab, 1 month in home health, and 1 month in outpatient rehab.    PRECAUTIONS: Fall  WEIGHT BEARING RESTRICTIONS: Due to palsy she is not allowed to lift weight.   PAIN:  Are you having pain? No  FALLS: Has patient fallen in last 6 months? Yes. Number of falls 3  LIVING ENVIRONMENT: Lives with: lives with their family Lives in: House/apartment  PLOF: Independent with basic ADLs  PATIENT GOALS: To get her hand moving  NEXT MD VISIT: 02/29/24  OBJECTIVE:  Note: Objective measures were completed at Evaluation unless otherwise noted.  HAND DOMINANCE: Left  ADLs: Overall ADLs: Pt unable to complete any fine motor tasks such as zippers, buttons, fasteners. She is unable to cook for herself or clean. Pt not able to lift anything.   FUNCTIONAL OUTCOME MEASURES: Quick Dash: 75  UPPER EXTREMITY ROM:     Active ROM Right eval  Shoulder flexion 87  Shoulder abduction 78  Shoulder internal rotation 90  Shoulder external rotation 20  Elbow flexion 134  Elbow extension 7  Wrist flexion 40  Wrist extension 8  Wrist ulnar deviation 44  Wrist radial deviation -1  Wrist pronation WFL  Wrist supination 70  (Blank rows = not tested)  Active ROM Right eval  Thumb MCP (0-60) 30  Thumb IP (0-80) 35  Thumb Opposition to Small Finger 4.5cm  Index MCP (0-90) 70  Index PIP (0-100) 80  Index DIP (0-70) 50   Long MCP (0-90)  70  Long PIP (0-100)  80  Long DIP (0-70)  55  Ring MCP (0-90)  75  Ring PIP (0-100)  85  Ring DIP (0-70)  60  Little MCP (0-90)  80  Little PIP (0-100)  95  Little DIP (0-70)  65  (Blank rows = not tested)   UPPER EXTREMITY MMT:     MMT Right eval  Shoulder flexion 4-/5  Shoulder abduction 4-/5  Shoulder internal rotation 4/5  Shoulder external rotation 4/5  Elbow flexion 3+/5  Elbow extension 4/5  Wrist flexion 3-/5  Wrist extension 3-/5  Wrist ulnar deviation 3/5  Wrist radial deviation 2/5  Wrist pronation 3/5  Wrist supination 3/5  (Blank rows = not tested)  HAND FUNCTION: Grip strength: Right:  14 lbs; Left: 49 lbs, Lateral pinch: Right: 3 lbs, Left: 9 lbs, and 3 point pinch: Right: unable lbs, Left: 9 lbs  COORDINATION: Box and Blocks:  Right 3blocks, Left 38blocks  SENSATION: Light touch: Impaired   OBSERVATIONS: Decreased attention and problem solving, requiring increased cuing.    TREATMENT DATE:  03/11/24 -Wrist ROM: flexion, extension, ulnar/radial deviation, supination/pronation, x15 -Thumb ROM: abduction, flexion at MCP, opposition, x10  -Digit ROM: abduction, taps, opposition, x10 (max difficulty) -Grasping and Picking up 10 cones and stacking them on top of each other -Theraputty: yellow putty, roll into ball, flatten into pancake, grasping PVC pipe to push circles into putty, roll into log, tripod pinch, lateral pinch, roll into ball, squeeze x6  03/09/24 -Wrist ROM: flexion, extension, ulnar/radial deviation, supination/pronation, x15 -Wrist strengthening: 1#, flexion, extension, ulnar/radial deviation, supination/pronation, x15 -max assist for  all movements -Grasping and lifting: attempted green weighted ball - unable, squish cone x10 with wrist stabilization -Therabar: attempted radial bend, pronation bend, and flexion twist - unable to achieve any bend despite max wrist stabilization -Theraputty: red putty, roll into ball, flatten into pancake, grasping PVC pipe to push circles into putty, roll into log, tripod pinch, lateral pinch, roll into ball, squeeze x6  03/03/24 -Ball Rolls: flexion/extension, supination/pronation, ulnar/radial twists, x12 each -Wrist ROM: flexion, extension, ulnar/radial deviation, supination/pronation, x15 -Theraputty: yellow putty, roll into a ball, flatten into a pancake, tripod pinch x10, lateral pinch x10, roll into ball, squeeze x10 -Thumb ROM: abduction, flexion at MCP, opposition, x10  -Digit ROM: abduction, taps, opposition, x10 (max difficulty) -Attempted Perfection game, however due to sensation and vision, pt unable to  complete   PATIENT EDUCATION: Education details: Continue HEP Person educated: Patient Education method: Medical illustrator Education comprehension: verbalized understanding and returned demonstration  HOME EXERCISE PROGRAM: 02/22/24: wrist A/ROM 7/24: thumb ROM    GOALS: Goals reviewed with patient? Yes   LONG TERM GOALS: Target date: 03/18/24  Pt provided and educated on HEP for RUE mobility in order to improve ADL independence.   Goal status: IN PROGRESS  2.  Pt will improve RUE shoulder ROM by 30* and wrist ROM by 20* in order to reach overhead and behind her back, as well as opening doors.   Goal status:  IN PROGRESS  3.  Pt will improve RUE strength to 4/5 in order to lift and carry items during light house work and meal prep.    Goal status:  IN PROGRESS  4.  Pt will improve grip strength by 15# and pinch strength by 4# in order to grip and hold items during dressing and grooming.  Goal status:  IN PROGRESS  5.  Pt will improve RUE coordination by completing 20 blocks in 1' with box and blocks in order to complete buckles, fasteners, and buttons with compensatory strategies.   Goal status: IN PROGRESS   ASSESSMENT:  CLINICAL IMPRESSION:  Pt continuing to have significant difficulty with wrist mobility and gripping. After wrist ROM, OT donned a wrist brace on pt's wrist to assist with maintaining stable and neutral wrist positioning. From there she worked on Investment banker, corporate and completing finger mobility with assisted stability. Verbal and tactile cuing provided for positioning and technique throughout session.   PERFORMANCE DEFICITS: in functional skills including ADLs, IADLs, sensation, edema, ROM, strength, pain, fascial restrictions, Fine motor control, Gross motor control, body mechanics, and UE functional use.      PLAN:  OT FREQUENCY: 2x/week  OT DURATION: 4 weeks  PLANNED INTERVENTIONS: 97168 OT Re-evaluation, 97535 self care/ADL  training, 02889 therapeutic exercise, 97530 therapeutic activity, 97112 neuromuscular re-education, 97140 manual therapy, 97035 ultrasound, 97018 paraffin, 02989 moist heat, 97032 electrical stimulation (manual), passive range of motion, functional mobility training, energy conservation, coping strategies training, patient/family education, and DME and/or AE instructions  CONSULTED AND AGREED WITH PLAN OF CARE: Patient and family member/caregiver  PLAN FOR NEXT SESSION: Manual Therapy as needed, AA/ROM, A/ROM as able, stretches   Valentin Nightingale, OTR/L [phone] 03/11/2024, 1:07 PM

## 2024-03-14 ENCOUNTER — Other Ambulatory Visit (HOSPITAL_COMMUNITY): Payer: Self-pay | Admitting: Physician Assistant

## 2024-03-14 DIAGNOSIS — S143XXA Injury of brachial plexus, initial encounter: Secondary | ICD-10-CM

## 2024-03-15 ENCOUNTER — Ambulatory Visit (HOSPITAL_COMMUNITY): Admitting: Occupational Therapy

## 2024-03-15 ENCOUNTER — Encounter (HOSPITAL_COMMUNITY): Payer: Self-pay | Admitting: Occupational Therapy

## 2024-03-15 DIAGNOSIS — M25621 Stiffness of right elbow, not elsewhere classified: Secondary | ICD-10-CM | POA: Diagnosis not present

## 2024-03-15 DIAGNOSIS — M25611 Stiffness of right shoulder, not elsewhere classified: Secondary | ICD-10-CM

## 2024-03-15 DIAGNOSIS — M25631 Stiffness of right wrist, not elsewhere classified: Secondary | ICD-10-CM

## 2024-03-15 DIAGNOSIS — M25531 Pain in right wrist: Secondary | ICD-10-CM | POA: Diagnosis not present

## 2024-03-15 DIAGNOSIS — R29898 Other symptoms and signs involving the musculoskeletal system: Secondary | ICD-10-CM

## 2024-03-15 DIAGNOSIS — M25521 Pain in right elbow: Secondary | ICD-10-CM | POA: Diagnosis not present

## 2024-03-15 DIAGNOSIS — M25511 Pain in right shoulder: Secondary | ICD-10-CM | POA: Diagnosis not present

## 2024-03-15 NOTE — Therapy (Signed)
 OUTPATIENT OCCUPATIONAL THERAPY ORTHO TREATMENT  Patient Name: Krystal [name] MRN: [id number] DOB:02-Jun-1941, 83 y.o., female Today's Date: 03/16/2024    END OF SESSION:  OT End of Session - 03/15/24 1522     Visit Number 7    Number of Visits 9    Date for OT Re-Evaluation 03/18/24    Authorization Type [employer]    OT Start Time 1436    OT Stop Time 1522    OT Time Calculation (min) 46 min    Activity Tolerance Patient tolerated treatment well    Behavior During Therapy WFL for tasks assessed/performed          Past Medical History:  Diagnosis Date   Anemia    Asthma    Brain tumor (HCC)    1986   Breast cancer (HCC)    Right breast   Cataract    Colon polyps    COPD (chronic obstructive pulmonary disease) (HCC)    Hiatal hernia    Hyperlipidemia    Macular degeneration    Osteoporosis    Pneumonia    PVD (posterior vitreous detachment)    Right knee pain    Shingles rash    Past Surgical History:  Procedure Laterality Date   CATARACT EXTRACTION, BILATERAL     COLONOSCOPY     CORONARY PRESSURE/FFR WITH 3D MAPPING N/A 03/21/2021   Procedure: Coronary Pressure Wire/FFR w/3D Mapping;  Surgeon: Mady Bruckner, MD;  Location: [facility];  Service: Cardiovascular;  Laterality: N/A;   CRANIECTOMY / CRANIOTOMY FOR EXCISION OF BRAIN TUMOR     Left side brain   LEFT HEART CATH AND CORONARY ANGIOGRAPHY N/A 03/21/2021   Procedure: LEFT HEART CATH AND CORONARY ANGIOGRAPHY;  Surgeon: Mady Bruckner, MD;  Location: [facility];  Service: Cardiovascular;  Laterality: N/A;   MASTECTOMY Right    ORIF HUMERUS FRACTURE Right 08/17/2023   Procedure: OPEN REDUCTION INTERNAL FIXATION (ORIF) DISTAL HUMERUS FRACTURE;  Surgeon: Kendal Franky SQUIBB, MD;  Location: [facility];  Service: Orthopedics;  Laterality: Right;   PARTIAL HYSTERECTOMY     POLYPECTOMY     SKIN CANCER EXCISION Left    Under left eye   Patient Active Problem List   Diagnosis Date Noted    Open fracture of distal end of humerus 08/17/2023   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   Hypoalbuminemia due to protein-calorie malnutrition (HCC) 08/17/2023   Atrial fibrillation, chronic (HCC) 08/17/2023   Open fracture of distal end of right humerus, unspecified fracture morphology, initial encounter 08/16/2023   Chronic combined systolic and diastolic heart failure (HCC) 03/16/2023   Nonrheumatic aortic valve insufficiency 05/13/2022   COPD (chronic obstructive pulmonary disease) (HCC) 04/23/2021   Gait instability 03/28/2021   Chronic right SI joint pain 03/28/2021   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   PAF (paroxysmal atrial fibrillation) (HCC) 03/28/2021   Unstable angina (HCC)    CAD (coronary artery disease) 03/20/2021   Osteoporosis 03/24/2019   Abdominal aortic atherosclerosis (HCC) 04/10/2015   PSVT (paroxysmal supraventricular tachycardia) (HCC) 05/10/2014   Macular degeneration, age related, nonexudative 08/24/2013   History of right mastectomy 07/13/2013   Anemia, iron deficiency 06/21/2013   Allergic rhinitis 06/21/2013   Chronic low back pain 06/21/2013   Malignant neoplasm of female breast (HCC) 11/22/2007   Mixed hyperlipidemia 11/22/2007   Anxiety state 11/22/2007   Depression, recurrent (HCC) 11/22/2007   Asthma 11/22/2007   GERD 11/22/2007   Diaphragmatic hernia 08/14/2007   Hiatal hernia 08/14/2007   ADENOMATOUS COLONIC  POLYP 02/23/2007   PCP: Maryanne Chew, MD  REFERRING PROVIDER: Danton Domino, PA-C  ONSET DATE: 08/16/23  REFERRING DIAG: Radial Nerve Palsy  THERAPY DIAG:  Right shoulder pain, unspecified chronicity  Shoulder stiffness, right  Pain in right wrist  Stiffness of right wrist, not elsewhere classified  Stiffness of right elbow, not elsewhere classified  Pain in right elbow  Other symptoms and signs involving the musculoskeletal system  Rationale for Evaluation and Treatment: Rehabilitation  SUBJECTIVE:   SUBJECTIVE STATEMENT: S:  It just doesn't like to work at all.   PERTINENT HISTORY: Pt had a fall 08/16/23 where she fractured her radius. Pt required ORIF of radius. Pt spent 3 weeks in inpatient rehab, 1 month in home health, and 1 month in outpatient rehab.   PRECAUTIONS: Fall  WEIGHT BEARING RESTRICTIONS: Due to palsy she is not allowed to lift weight.   PAIN:  Are you having pain? No  FALLS: Has patient fallen in last 6 months? Yes. Number of falls 3  LIVING ENVIRONMENT: Lives with: lives with their family Lives in: House/apartment  PLOF: Independent with basic ADLs  PATIENT GOALS: To get her hand moving  NEXT MD VISIT: 02/29/24  OBJECTIVE:  Note: Objective measures were completed at Evaluation unless otherwise noted.  HAND DOMINANCE: Left  ADLs: Overall ADLs: Pt unable to complete any fine motor tasks such as zippers, buttons, fasteners. She is unable to cook for herself or clean. Pt not able to lift anything.   FUNCTIONAL OUTCOME MEASURES: Quick Dash: 75  UPPER EXTREMITY ROM:     Active ROM Right eval  Shoulder flexion 87  Shoulder abduction 78  Shoulder internal rotation 90  Shoulder external rotation 20  Elbow flexion 134  Elbow extension 7  Wrist flexion 40  Wrist extension 8  Wrist ulnar deviation 44  Wrist radial deviation -1  Wrist pronation WFL  Wrist supination 70  (Blank rows = not tested)  Active ROM Right eval  Thumb MCP (0-60) 30  Thumb IP (0-80) 35  Thumb Opposition to Small Finger 4.5cm  Index MCP (0-90) 70  Index PIP (0-100) 80  Index DIP (0-70) 50   Long MCP (0-90)  70  Long PIP (0-100)  80  Long DIP (0-70)  55  Ring MCP (0-90)  75  Ring PIP (0-100)  85  Ring DIP (0-70)  60  Little MCP (0-90)  80  Little PIP (0-100)  95  Little DIP (0-70)  65  (Blank rows = not tested)   UPPER EXTREMITY MMT:     MMT Right eval  Shoulder flexion 4-/5  Shoulder abduction 4-/5  Shoulder internal rotation 4/5  Shoulder external rotation 4/5  Elbow flexion 3+/5   Elbow extension 4/5  Wrist flexion 3-/5  Wrist extension 3-/5  Wrist ulnar deviation 3/5  Wrist radial deviation 2/5  Wrist pronation 3/5  Wrist supination 3/5  (Blank rows = not tested)  HAND FUNCTION: Grip strength: Right: 14 lbs; Left: 49 lbs, Lateral pinch: Right: 3 lbs, Left: 9 lbs, and 3 point pinch: Right: unable lbs, Left: 9 lbs  COORDINATION: Box and Blocks:  Right 3blocks, Left 38blocks  SENSATION: Light touch: Impaired   OBSERVATIONS: Decreased attention and problem solving, requiring increased cuing.    TREATMENT DATE:   03/15/24 -Wrist ROM: flexion, extension, ulnar/radial deviation, supination/pronation, x15 -Grasping and Picking up 10 cones and stacking them on top of each other - wearing wrist brace -Thumb ROM: abduction, flexion at MCP, x10 -Finger extension, x10 each  finger -Strengthening: elbow flexion/extension, shoulder abduction, flexion, protraction, x10 -Isometrics: pushing down on squish cone x10 -UBE: level 1, 2.5' forwards and backwards  03/11/24 -Wrist ROM: flexion, extension, ulnar/radial deviation, supination/pronation, x15 -Thumb ROM: abduction, flexion at MCP, opposition, x10  -Digit ROM: abduction, taps, opposition, x10 (max difficulty) -Grasping and Picking up 10 cones and stacking them on top of each other -Theraputty: yellow putty, roll into ball, flatten into pancake, grasping PVC pipe to push circles into putty, roll into log, tripod pinch, lateral pinch, roll into ball, squeeze x6  03/09/24 -Wrist ROM: flexion, extension, ulnar/radial deviation, supination/pronation, x15 -Wrist strengthening: 1#, flexion, extension, ulnar/radial deviation, supination/pronation, x15 -max assist for all movements -Grasping and lifting: attempted green weighted ball - unable, squish cone x10 with wrist stabilization -Therabar: attempted radial bend, pronation bend, and flexion twist - unable to achieve any bend despite max wrist stabilization -Theraputty:  red putty, roll into ball, flatten into pancake, grasping PVC pipe to push circles into putty, roll into log, tripod pinch, lateral pinch, roll into ball, squeeze x6   PATIENT EDUCATION: Education details: Continue HEP Person educated: Patient Education method: Medical illustrator Education comprehension: verbalized understanding and returned demonstration  HOME EXERCISE PROGRAM: 02/22/24: wrist A/ROM 7/24: thumb ROM    GOALS: Goals reviewed with patient? Yes   LONG TERM GOALS: Target date: 03/18/24  Pt provided and educated on HEP for RUE mobility in order to improve ADL independence.   Goal status: IN PROGRESS  2.  Pt will improve RUE shoulder ROM by 30* and wrist ROM by 20* in order to reach overhead and behind her back, as well as opening doors.   Goal status:  IN PROGRESS  3.  Pt will improve RUE strength to 4/5 in order to lift and carry items during light house work and meal prep.    Goal status:  IN PROGRESS  4.  Pt will improve grip strength by 15# and pinch strength by 4# in order to grip and hold items during dressing and grooming.  Goal status:  IN PROGRESS  5.  Pt will improve RUE coordination by completing 20 blocks in 1' with box and blocks in order to complete buckles, fasteners, and buttons with compensatory strategies.   Goal status: IN PROGRESS   ASSESSMENT:  CLINICAL IMPRESSION:  This session pt continuing to have significant difficulties with grasping and moving her wrist and hand. OT also noted this session that pt has poor elbow mobility and shoulder mobility. She is unable to fully extend her elbow or flex her shoulder with increased weakness. Wrist brace continues to assist with grasping light weight medium objects. OT providing hands on assist throughout session, as well as verbal and tactile cuing for positioning and technique.   PERFORMANCE DEFICITS: in functional skills including ADLs, IADLs, sensation, edema, ROM, strength, pain,  fascial restrictions, Fine motor control, Gross motor control, body mechanics, and UE functional use.      PLAN:  OT FREQUENCY: 2x/week  OT DURATION: 4 weeks  PLANNED INTERVENTIONS: 97168 OT Re-evaluation, 97535 self care/ADL training, 02889 therapeutic exercise, 97530 therapeutic activity, 97112 neuromuscular re-education, 97140 manual therapy, 97035 ultrasound, 97018 paraffin, 02989 moist heat, 97032 electrical stimulation (manual), passive range of motion, functional mobility training, energy conservation, coping strategies training, patient/family education, and DME and/or AE instructions  CONSULTED AND AGREED WITH PLAN OF CARE: Patient and family member/caregiver  PLAN FOR NEXT SESSION: Manual Therapy as needed, AA/ROM, A/ROM as able, stretches   Valentin Nightingale, OTR/L [phone] 03/16/2024,  2:34 PM

## 2024-03-17 ENCOUNTER — Ambulatory Visit (HOSPITAL_COMMUNITY): Admitting: Occupational Therapy

## 2024-03-17 ENCOUNTER — Encounter (HOSPITAL_COMMUNITY): Payer: Self-pay | Admitting: Occupational Therapy

## 2024-03-17 DIAGNOSIS — M25521 Pain in right elbow: Secondary | ICD-10-CM

## 2024-03-17 DIAGNOSIS — M25611 Stiffness of right shoulder, not elsewhere classified: Secondary | ICD-10-CM | POA: Diagnosis not present

## 2024-03-17 DIAGNOSIS — M25621 Stiffness of right elbow, not elsewhere classified: Secondary | ICD-10-CM

## 2024-03-17 DIAGNOSIS — R29898 Other symptoms and signs involving the musculoskeletal system: Secondary | ICD-10-CM

## 2024-03-17 DIAGNOSIS — M25531 Pain in right wrist: Secondary | ICD-10-CM | POA: Diagnosis not present

## 2024-03-17 DIAGNOSIS — M25631 Stiffness of right wrist, not elsewhere classified: Secondary | ICD-10-CM | POA: Diagnosis not present

## 2024-03-17 DIAGNOSIS — M25511 Pain in right shoulder: Secondary | ICD-10-CM

## 2024-03-17 NOTE — Therapy (Signed)
 OUTPATIENT OCCUPATIONAL THERAPY ORTHO TREATMENT DISCHARGE NOTE  Patient Name: Krystal [name] MRN: [id number] DOB:02-Sep-1940, 83 y.o., female Today's Date: 03/17/2024  OCCUPATIONAL THERAPY DISCHARGE SUMMARY  Visits from Start of Care: 8  Current functional level related to goals / functional outcomes: Pt continues to have major deficits. No OT goals have been met.    Remaining deficits: Pt continues to have poor mobility at shoulder, elbow, and wrist, as well as decreased grasp, poor sensation, and limited coordination.    Education / Equipment: Pt has been provided comprehensive HEP.    Plan: Patient agrees to discharge as she is not making any progress and has an MRI coming up of her bracial plexus.      END OF SESSION:  OT End of Session - 03/17/24 1511     Visit Number 8    Number of Visits 9    Date for OT Re-Evaluation 03/18/24    Authorization Type [employer]    OT Start Time 1418    OT Stop Time 1459    OT Time Calculation (min) 41 min    Activity Tolerance Patient tolerated treatment well    Behavior During Therapy WFL for tasks assessed/performed           Past Medical History:  Diagnosis Date   Anemia    Asthma    Brain tumor (HCC)    1986   Breast cancer (HCC)    Right breast   Cataract    Colon polyps    COPD (chronic obstructive pulmonary disease) (HCC)    Hiatal hernia    Hyperlipidemia    Macular degeneration    Osteoporosis    Pneumonia    PVD (posterior vitreous detachment)    Right knee pain    Shingles rash    Past Surgical History:  Procedure Laterality Date   CATARACT EXTRACTION, BILATERAL     COLONOSCOPY     CORONARY PRESSURE/FFR WITH 3D MAPPING N/A 03/21/2021   Procedure: Coronary Pressure Wire/FFR w/3D Mapping;  Surgeon: Mady Bruckner, MD;  Location: [facility];  Service: Cardiovascular;  Laterality: N/A;   CRANIECTOMY / CRANIOTOMY FOR EXCISION OF BRAIN TUMOR     Left side brain   LEFT HEART CATH AND  CORONARY ANGIOGRAPHY N/A 03/21/2021   Procedure: LEFT HEART CATH AND CORONARY ANGIOGRAPHY;  Surgeon: Mady Bruckner, MD;  Location: [facility];  Service: Cardiovascular;  Laterality: N/A;   MASTECTOMY Right    ORIF HUMERUS FRACTURE Right 08/17/2023   Procedure: OPEN REDUCTION INTERNAL FIXATION (ORIF) DISTAL HUMERUS FRACTURE;  Surgeon: Kendal Franky SQUIBB, MD;  Location: [facility];  Service: Orthopedics;  Laterality: Right;   PARTIAL HYSTERECTOMY     POLYPECTOMY     SKIN CANCER EXCISION Left    Under left eye   Patient Active Problem List   Diagnosis Date Noted   Open fracture of distal end of humerus 08/17/2023   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   Hypoalbuminemia due to protein-calorie malnutrition (HCC) 08/17/2023   Atrial fibrillation, chronic (HCC) 08/17/2023   Open fracture of distal end of right humerus, unspecified fracture morphology, initial encounter 08/16/2023   Chronic combined systolic and diastolic heart failure (HCC) 03/16/2023   Nonrheumatic aortic valve insufficiency 05/13/2022   COPD (chronic obstructive pulmonary disease) (HCC) 04/23/2021   Gait instability 03/28/2021   Chronic right SI joint pain 03/28/2021   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   PAF (paroxysmal atrial fibrillation) (HCC) 03/28/2021   Unstable angina (HCC)    CAD (coronary  artery disease) 03/20/2021   Osteoporosis 03/24/2019   Abdominal aortic atherosclerosis (HCC) 04/10/2015   PSVT (paroxysmal supraventricular tachycardia) (HCC) 05/10/2014   Macular degeneration, age related, nonexudative 08/24/2013   History of right mastectomy 07/13/2013   Anemia, iron deficiency 06/21/2013   Allergic rhinitis 06/21/2013   Chronic low back pain 06/21/2013   Malignant neoplasm of female breast (HCC) 11/22/2007   Mixed hyperlipidemia 11/22/2007   Anxiety state 11/22/2007   Depression, recurrent (HCC) 11/22/2007   Asthma 11/22/2007   GERD 11/22/2007   Diaphragmatic hernia 08/14/2007   Hiatal hernia 08/14/2007    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   PCP: Maryanne Chew, MD  REFERRING PROVIDER: Danton Domino, PA-C  ONSET DATE: 08/16/23  REFERRING DIAG: Radial Nerve Palsy  THERAPY DIAG:  Right shoulder pain, unspecified chronicity  Shoulder stiffness, right  Pain in right wrist  Stiffness of right wrist, not elsewhere classified  Stiffness of right elbow, not elsewhere classified  Pain in right elbow  Other symptoms and signs involving the musculoskeletal system  Rationale for Evaluation and Treatment: Rehabilitation  SUBJECTIVE:   SUBJECTIVE STATEMENT: S: My hand bothers me a lot.   PERTINENT HISTORY: Pt had a fall 08/16/23 where she fractured her radius. Pt required ORIF of radius. Pt spent 3 weeks in inpatient rehab, 1 month in home health, and 1 month in outpatient rehab.   PRECAUTIONS: Fall  WEIGHT BEARING RESTRICTIONS: Due to palsy she is not allowed to lift weight.   PAIN:  Are you having pain? Yes: NPRS scale: 5/10 Pain location: dorsal aspect of hand Pain description: pins and needles Aggravating factors: nothing Relieving factors: unsure  FALLS: Has patient fallen in last 6 months? Yes. Number of falls 3  LIVING ENVIRONMENT: Lives with: lives with their family Lives in: House/apartment  PLOF: Independent with basic ADLs  PATIENT GOALS: To get her hand moving  NEXT MD VISIT: 02/29/24  OBJECTIVE:  Note: Objective measures were completed at Evaluation unless otherwise noted.  HAND DOMINANCE: Left  ADLs: Overall ADLs: Pt unable to complete any fine motor tasks such as zippers, buttons, fasteners. She is unable to cook for herself or clean. Pt not able to lift anything.   FUNCTIONAL OUTCOME MEASURES: Quick Dash: 75  UPPER EXTREMITY ROM:     Active ROM Right eval Right 03/17/24  Shoulder flexion 87 108  Shoulder abduction 78 90  Shoulder internal rotation 90 90  Shoulder external rotation 20 24  Elbow flexion 134 125  Elbow extension 7 11   Wrist flexion 40 56  Wrist extension 8 10  Wrist ulnar deviation 44 42  Wrist radial deviation -1 1  Wrist pronation [facility] WFL  Wrist supination 70 WFL  (Blank rows = not tested)  Active ROM Right eval Right 03/17/24  Thumb MCP (0-60) 30 30  Thumb IP (0-80) 35 45  Thumb Opposition to Small Finger 4.5cm 3.5cm  Index MCP (0-90) 70 50  Index PIP (0-100) 80 105  Index DIP (0-70) 50  70  Long MCP (0-90)  70 75  Long PIP (0-100)  80 95  Long DIP (0-70)  55 70  Ring MCP (0-90)  75 80  Ring PIP (0-100)  85 95  Ring DIP (0-70)  60 45  Little MCP (0-90)  80 80  Little PIP (0-100)  95 90  Little DIP (0-70)  65 50  (Blank rows = not tested)   UPPER EXTREMITY MMT:     MMT Right eval Right 03/17/24  Shoulder flexion 4-/5 4-/5  Shoulder abduction 4-/5 4-/5  Shoulder internal rotation 4/5 4-/5  Shoulder external rotation 4/5 4-/5  Elbow flexion 3+/5 3+/5  Elbow extension 4/5 4/5  Wrist flexion 3-/5 3/5  Wrist extension 3-/5 3-/5  Wrist ulnar deviation 3/5 3/5  Wrist radial deviation 2/5 2/5  Wrist pronation 3/5 4-/5  Wrist supination 3/5 4/5  (Blank rows = not tested)  HAND FUNCTION: Grip strength: Right: 14 lbs; Left: 49 lbs, Lateral pinch: Right: 3 lbs, Left: 9 lbs, and 3 point pinch: Right: unable lbs, Left: 9 lbs 03/17/24: Grip strength: Right: 12 lbs; Lateral pinch: Right: 3 lbs, and 3 point pinch: Right: unable lbs  COORDINATION: Box and Blocks:  Right 3blocks, Left 38blocks 03/17/24: Box and Blocks:  Right 3 blocks  SENSATION: Light touch: Impaired   OBSERVATIONS: Decreased attention and problem solving, requiring increased cuing.    TREATMENT DATE  03/17/24 -Shoulder ROM: flexion, abduction, protraction, er/IR, x8 -Wrist ROM: flexion, extension, ulnar/radial deviation, supination/pronation, x15 -Picking up blocks and moving them across midline x10- does better when blocks are singled out -Yellow theraputty: roll into a ball, flatten/pull into a pancake, use PVC  pipe to cut 10 circles, roll into ball, squeeze x10 -Reassessment/Re-Evaluation  03/15/24 -Wrist ROM: flexion, extension, ulnar/radial deviation, supination/pronation, x15 -Grasping and Picking up 10 cones and stacking them on top of each other - wearing wrist brace -Thumb ROM: abduction, flexion at MCP, x10 -Finger extension, x10 each finger -Strengthening: elbow flexion/extension, shoulder abduction, flexion, protraction, x10 -Isometrics: pushing down on squish cone x10 -UBE: level 1, 2.5' forwards and backwards  03/11/24 -Wrist ROM: flexion, extension, ulnar/radial deviation, supination/pronation, x15 -Thumb ROM: abduction, flexion at MCP, opposition, x10  -Digit ROM: abduction, taps, opposition, x10 (max difficulty) -Grasping and Picking up 10 cones and stacking them on top of each other -Theraputty: yellow putty, roll into ball, flatten into pancake, grasping PVC pipe to push circles into putty, roll into log, tripod pinch, lateral pinch, roll into ball, squeeze x6   PATIENT EDUCATION: Education details: Shoulder ROM Person educated: Patient Education method: Explanation and Demonstration Education comprehension: verbalized understanding and returned demonstration  HOME EXERCISE PROGRAM: 02/22/24: wrist A/ROM 7/24: thumb ROM 8/14: Shoulder ROM    GOALS: Goals reviewed with patient? Yes   LONG TERM GOALS: Target date: 03/18/24  Pt provided and educated on HEP for RUE mobility in order to improve ADL independence.   Goal status: MET  2.  Pt will improve RUE shoulder ROM by 30* and wrist ROM by 20* in order to reach overhead and behind her back, as well as opening doors.   Goal status:  NOT MET  3.  Pt will improve RUE strength to 4/5 in order to lift and carry items during light house work and meal prep.    Goal status:  NOT MET  4.  Pt will improve grip strength by 15# and pinch strength by 4# in order to grip and hold items during dressing and grooming.  Goal  status:  NOT MET  5.  Pt will improve RUE coordination by completing 20 blocks in 1' with box and blocks in order to complete buckles, fasteners, and buttons with compensatory strategies.   Goal status: NOT MET   ASSESSMENT:  CLINICAL IMPRESSION:  Pt completed reassessment this session. She is not making any progress. Pt continues to have poor mobility from the shoulder down to her fingers, as well as decreased sensation. OT reviewed HEP with patient, however per her family, pt has had poor follow  through with all HEP. Pt is discharging at this time and will follow up with her MD following MRI.   PERFORMANCE DEFICITS: in functional skills including ADLs, IADLs, sensation, edema, ROM, strength, pain, fascial restrictions, Fine motor control, Gross motor control, body mechanics, and UE functional use.      PLAN:  OT FREQUENCY: 2x/week  OT DURATION: 4 weeks  PLANNED INTERVENTIONS: 97168 OT Re-evaluation, 97535 self care/ADL training, 02889 therapeutic exercise, 97530 therapeutic activity, 97112 neuromuscular re-education, 97140 manual therapy, 97035 ultrasound, 97018 paraffin, 02989 moist heat, 97032 electrical stimulation (manual), passive range of motion, functional mobility training, energy conservation, coping strategies training, patient/family education, and DME and/or AE instructions  CONSULTED AND AGREED WITH PLAN OF CARE: Patient and family member/caregiver  PLAN FOR NEXT SESSION: Discharge   Valentin Nightingale, OTR/L [phone] 03/17/2024, 3:14 PM

## 2024-03-18 DIAGNOSIS — Z008 Encounter for other general examination: Secondary | ICD-10-CM | POA: Diagnosis not present

## 2024-03-19 ENCOUNTER — Ambulatory Visit (HOSPITAL_COMMUNITY)

## 2024-03-22 DIAGNOSIS — H353124 Nonexudative age-related macular degeneration, left eye, advanced atrophic with subfoveal involvement: Secondary | ICD-10-CM | POA: Diagnosis not present

## 2024-03-22 DIAGNOSIS — H353113 Nonexudative age-related macular degeneration, right eye, advanced atrophic without subfoveal involvement: Secondary | ICD-10-CM | POA: Diagnosis not present

## 2024-03-26 ENCOUNTER — Ambulatory Visit (HOSPITAL_COMMUNITY)
Admission: RE | Admit: 2024-03-26 | Discharge: 2024-03-26 | Disposition: A | Discharge status: Home / Self Care | Source: Ambulatory Visit | Attending: Physician Assistant | Admitting: Physician Assistant

## 2024-03-26 DIAGNOSIS — S143XXA Injury of brachial plexus, initial encounter: Secondary | ICD-10-CM | POA: Diagnosis not present

## 2024-03-29 DIAGNOSIS — S42201A Unspecified fracture of upper end of right humerus, initial encounter for closed fracture: Secondary | ICD-10-CM | POA: Diagnosis not present

## 2024-03-31 DIAGNOSIS — B351 Tinea unguium: Secondary | ICD-10-CM | POA: Diagnosis not present

## 2024-04-18 ENCOUNTER — Telehealth: Payer: Self-pay

## 2024-04-18 NOTE — Telephone Encounter (Signed)
 Daughter made aware. No further concerns.

## 2024-04-18 NOTE — Telephone Encounter (Signed)
 Copied from CRM #[id number]. Topic: Clinical - Lab/Test Results >> Apr 18, 2024 11:00 AM Harlene ORN wrote: Reason for CRM: Daughter of patient called to request patient's MRI results.

## 2024-04-18 NOTE — Telephone Encounter (Signed)
 Dr. Maryanne, are you alright to review?

## 2024-04-18 NOTE — Telephone Encounter (Signed)
 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  As from what I can tell

## 2024-04-27 ENCOUNTER — Telehealth: Payer: Self-pay

## 2024-04-27 DIAGNOSIS — K581 Irritable bowel syndrome with constipation: Secondary | ICD-10-CM

## 2024-04-27 NOTE — Telephone Encounter (Signed)
 Copied from CRM #[id number]. Topic: Clinical - Prescription Issue >> Apr 27, 2024 12:00 PM Delon T wrote: Reason for CRM: linaclotide  (LINZESS ) 72 MCG capsule- denied refill, need something else if unable to get this medication - [phone]

## 2024-04-28 MED ORDER — LINACLOTIDE 72 MCG PO CAPS: 72.0000 ug | ORAL_CAPSULE | Freq: Every day | ORAL | 0 refills | Status: DC

## 2024-04-28 NOTE — Telephone Encounter (Signed)
 Likely was denied because she needed a visit, looks like she has been October 3 so just go ahead and send her enough Linzess  to get through to that date, just give her 30-day supply.

## 2024-04-28 NOTE — Addendum Note (Signed)
 Addended by: LEIGH ROSINA SAILOR on: 04/28/2024 11:26 AM   Modules accepted: Orders

## 2024-04-28 NOTE — Telephone Encounter (Signed)
 30d supply sent to [facility] in [place]. Left message making pt aware.

## 2024-04-29 DIAGNOSIS — S42201A Unspecified fracture of upper end of right humerus, initial encounter for closed fracture: Secondary | ICD-10-CM | POA: Diagnosis not present

## 2024-04-30 ENCOUNTER — Other Ambulatory Visit: Payer: Self-pay | Admitting: Cardiology

## 2024-05-03 DIAGNOSIS — H353113 Nonexudative age-related macular degeneration, right eye, advanced atrophic without subfoveal involvement: Secondary | ICD-10-CM | POA: Diagnosis not present

## 2024-05-03 DIAGNOSIS — H353124 Nonexudative age-related macular degeneration, left eye, advanced atrophic with subfoveal involvement: Secondary | ICD-10-CM | POA: Diagnosis not present

## 2024-05-06 ENCOUNTER — Ambulatory Visit: Admitting: Family Medicine

## 2024-05-06 ENCOUNTER — Encounter: Payer: Self-pay | Admitting: Family Medicine

## 2024-05-06 VITALS — BP 95/57 | HR 83 | Temp 97.1°F | Ht 63.0 in | Wt 141.0 lb

## 2024-05-06 DIAGNOSIS — K581 Irritable bowel syndrome with constipation: Secondary | ICD-10-CM | POA: Diagnosis not present

## 2024-05-06 DIAGNOSIS — R7303 Prediabetes: Secondary | ICD-10-CM | POA: Diagnosis not present

## 2024-05-06 DIAGNOSIS — F339 Major depressive disorder, recurrent, unspecified: Secondary | ICD-10-CM | POA: Diagnosis not present

## 2024-05-06 DIAGNOSIS — R7301 Impaired fasting glucose: Secondary | ICD-10-CM | POA: Diagnosis not present

## 2024-05-06 DIAGNOSIS — F411 Generalized anxiety disorder: Secondary | ICD-10-CM

## 2024-05-06 DIAGNOSIS — J4520 Mild intermittent asthma, uncomplicated: Secondary | ICD-10-CM

## 2024-05-06 DIAGNOSIS — R600 Localized edema: Secondary | ICD-10-CM

## 2024-05-06 DIAGNOSIS — Z23 Encounter for immunization: Secondary | ICD-10-CM | POA: Diagnosis not present

## 2024-05-06 DIAGNOSIS — I480 Paroxysmal atrial fibrillation: Secondary | ICD-10-CM

## 2024-05-06 DIAGNOSIS — E782 Mixed hyperlipidemia: Secondary | ICD-10-CM

## 2024-05-06 LAB — BAYER DCA HB A1C WAIVED: HB A1C (BAYER DCA - WAIVED): 6.2 % — ABNORMAL HIGH (ref 4.8–5.6)

## 2024-05-06 MED ORDER — DILTIAZEM HCL ER COATED BEADS 120 MG PO CP24: 120.0000 mg | ORAL_CAPSULE | Freq: Every day | ORAL | 3 refills | Status: AC

## 2024-05-06 MED ORDER — LINACLOTIDE 72 MCG PO CAPS: 72.0000 ug | ORAL_CAPSULE | Freq: Every day | ORAL | 3 refills | Status: AC

## 2024-05-06 MED ORDER — IPRATROPIUM-ALBUTEROL 0.5-2.5 (3) MG/3ML IN SOLN: 3.0000 mL | Freq: Four times a day (QID) | RESPIRATORY_TRACT | 3 refills | Status: AC | PRN

## 2024-05-06 MED ORDER — FERROUS SULFATE 325 (65 FE) MG PO TABS: 325.0000 mg | ORAL_TABLET | Freq: Every day | ORAL | 3 refills | Status: AC

## 2024-05-06 NOTE — Progress Notes (Signed)
 BP (!) 95/57   Pulse 83   Temp (!) 97.1 F (36.2 C)   Ht 5' 3 (1.6 m)   Wt 141 lb (64 kg)   SpO2 95%   BMI 24.98 kg/m    Subjective:   Patient ID: Krystal [name], female    DOB: 07-15-1941, 83 y.o.   MRN: [id number]  HPI: Krystal [name] is a 83 y.o. female presenting on 05/06/2024 for Medical Management of Chronic Issues   Discussed the use of AI scribe software for clinical note transcription with the patient, who gave verbal consent to proceed.  History of Present Illness   Krystal [name] is an 83 year old female with multiple chronic conditions who presents for a chronic medical recheck.  Asthma and respiratory symptoms - Asthma managed with DuoNeb and Singulair  - Breathing status stable and unchanged from baseline - No tobacco use - Dog exposed to secondhand smoke from a household member who smokes indoors  Gastrointestinal symptoms - Irritable bowel syndrome with constipation - Bowel movements regular with Linzess  - No diarrhea - Frequent belching, especially with eating or drinking water - Heartburn managed with Nexium  - Occasional use of Tums or milk for symptom relief - No blood in stool  Cardiovascular symptoms - Atrial fibrillation managed with Eliquis  - No bleeding or blood in stool - Lightheadedness attributed to low blood pressure - Currently taking diltiazem ; metoprolol  discontinued a couple of months ago - Scheduled to see cardiologist this month  Edema and diuretic use - Leg swelling present - Lasix  used for edema, but avoided when needing to go out due to increased urination  Mood and psychiatric symptoms - Anxiety and depression managed with Zoloft  - Mood stable  Metabolic findings - Hyperlipidemia managed with Crestor  - Hemoglobin A1c 6.2, consistent with prediabetes - Family history of diabetes          Relevant past medical, surgical, family and social history reviewed and updated as indicated. Interim medical history  since our last visit reviewed. Allergies and medications reviewed and updated.  Review of Systems  Constitutional:  Negative for chills and fever.  HENT:  Positive for congestion. Negative for ear discharge and ear pain.   Eyes:  Negative for redness and visual disturbance.  Respiratory:  Positive for cough. Negative for chest tightness, shortness of breath and wheezing.   Cardiovascular:  Negative for chest pain and leg swelling.  Genitourinary:  Negative for difficulty urinating and dysuria.  Musculoskeletal:  Negative for back pain and gait problem.  Skin:  Negative for rash.  Neurological:  Positive for dizziness and light-headedness. Negative for headaches.  Psychiatric/Behavioral:  Negative for agitation and behavioral problems.   All other systems reviewed and are negative.   Per HPI unless specifically indicated above   Allergies as of 05/06/2024       Reactions   Actonel [risedronate] Nausea And Vomiting, Other (See Comments)   Throat tightness   Fosamax [alendronate Sodium] Other (See Comments)   esophagitis   Mevacor [lovastatin] Other (See Comments)   myalgias   Miacalcin [calcitonin (salmon)] Nausea And Vomiting   Mobic  [meloxicam ] Hives, Itching   rash        Medication List        Accurate as of May 06, 2024  1:44 PM. If you have any questions, ask your nurse or doctor.          acetaminophen  500 MG tablet Commonly known as: TYLENOL  Take 2 tablets (1,000 mg total) by mouth  every 6 (six) hours as needed for mild pain (pain score 1-3) or moderate pain (pain score 4-6).   Calcium  Citrate-Vitamin D  315-250 MG-UNIT Tabs Commonly known as: Calcium  Citrate + D Take 2 tablets by mouth daily.   diltiazem  120 MG 24 hr capsule Commonly known as: Cardizem  CD Take 1 capsule (120 mg total) by mouth daily. What changed:  medication strength how much to take Changed by: Fonda LABOR Lumir Demetriou   docusate sodium  100 MG capsule Commonly known as: COLACE Take  1 capsule (100 mg total) by mouth 2 (two) times daily.   Eliquis  5 MG Tabs tablet Generic drug: apixaban  TAKE 1 TABLET BY MOUTH TWICE A DAY   esomeprazole  40 MG capsule Commonly known as: NEXIUM  Take 1 capsule (40 mg total) by mouth daily.   famotidine  20 MG tablet Commonly known as: PEPCID  TAKE ONE TABLET DAILY AFTER SUPPER   ferrous sulfate  325 (65 FE) MG tablet Take 1 tablet (325 mg total) by mouth at bedtime.   ipratropium-albuterol  0.5-2.5 (3) MG/3ML Soln Commonly known as: DUONEB Take 3 mLs by nebulization every 6 (six) hours as needed. Dx:  Asthma 493.90   linaclotide  72 MCG capsule Commonly known as: Linzess  Take 1 capsule (72 mcg total) by mouth daily before breakfast.   methocarbamol  500 MG tablet Commonly known as: ROBAXIN  Take 1 tablet (500 mg total) by mouth every 8 (eight) hours as needed for muscle spasms.   metoprolol  tartrate 25 MG tablet Commonly known as: LOPRESSOR  Take 0.5 tablets (12.5 mg total) by mouth 2 (two) times daily.   montelukast  10 MG tablet Commonly known as: SINGULAIR  TAKE 1 TABLET BY MOUTH EVERYDAY AT BEDTIME   polyethylene glycol 17 g packet Commonly known as: MIRALAX  / GLYCOLAX  Take 17 g by mouth daily as needed for mild constipation.   PRESERVISION/LUTEIN PO Take 1 capsule by mouth at bedtime.   rosuvastatin  20 MG tablet Commonly known as: CRESTOR  TAKE 1 TABLET BY MOUTH EVERY DAY   sertraline  100 MG tablet Commonly known as: ZOLOFT  Take 1 tablet (100 mg total) by mouth daily.   Vitamin D3 125 MCG (5000 UT) Caps Take 5,000 Units by mouth daily.         Objective:   BP (!) 95/57   Pulse 83   Temp (!) 97.1 F (36.2 C)   Ht 5' 3 (1.6 m)   Wt 141 lb (64 kg)   SpO2 95%   BMI 24.98 kg/m   Wt Readings from Last 3 Encounters:  05/06/24 141 lb (64 kg)  02/03/24 144 lb (65.3 kg)  10/23/23 148 lb (67.1 kg)    Physical Exam Vitals and nursing note reviewed.    Physical Exam   NECK: Thyroid  normal, no lumps or  nodules. CHEST: Lungs clear to auscultation. CARDIOVASCULAR: Irregular heart rhythm, atrial fibrillation, systolic murmur present. EXTREMITIES: 1+ edema in legs, good pulses bilaterally.         Assessment & Plan:   Problem List Items Addressed This Visit       Cardiovascular and Mediastinum   PAF (paroxysmal atrial fibrillation) (HCC)   Relevant Medications   diltiazem  (CARDIZEM  CD) 120 MG 24 hr capsule     Respiratory   Asthma - Primary   Relevant Medications   ipratropium-albuterol  (DUONEB) 0.5-2.5 (3) MG/3ML SOLN     Digestive   Irritable bowel syndrome with constipation   Relevant Medications   linaclotide  (LINZESS ) 72 MCG capsule     Other   Mixed hyperlipidemia   Relevant  Medications   diltiazem  (CARDIZEM  CD) 120 MG 24 hr capsule   Anxiety state   Depression, recurrent (HCC)   Prediabetes   Other Visit Diagnoses       Elevated fasting blood sugar       Relevant Orders   Bayer DCA Hb A1c Waived     Peripheral edema       Relevant Medications   ferrous sulfate  325 (65 FE) MG tablet     Encounter for immunization       Relevant Orders   Flu vaccine HIGH DOSE PF(Fluzone Trivalent) (Completed)         Atrial fibrillation Managed with diltiazem  and Eliquis . Heart rate stable, no bleeding. Low blood pressure likely due to heart rate medications. Metoprolol  discontinued. - Reduce diltiazem  to 120 mg daily. - Continue Eliquis . - Follow up with cardiologist this month.  Peripheral edema Mild 1+ swelling. Lasix  prescribed but not taken when going out. - Take Lasix  when at home.  Mixed hyperlipidemia Managed with Crestor . - Continue Crestor .  Prediabetes Stable with A1c of 6.2. No medication required.  Irritable bowel syndrome with constipation Managed with Linzess . Regular bowel movements without diarrhea. - Continue Linzess .  Gastroesophageal reflux disease Managed with Nexium . Experiences belching and gas postprandially. - Continue Nexium . -  Use Tums or milk for belching and gas.  Asthma Well-controlled with DuoNeb and Singulair . No recent exacerbations. - Continue DuoNeb and Singulair . - Advise to avoid secondhand smoke exposure.  Depression and generalized anxiety disorder Well-controlled with Zoloft . Mood and anxiety stable. - Continue Zoloft .          Follow up plan: Return in about 6 months (around 11/04/2024), or if symptoms worsen or fail to improve, for Prediabetes and hyperlipidemia and anxiety.  Counseling provided for all of the vaccine components Orders Placed This Encounter  Procedures   Flu vaccine HIGH DOSE PF(Fluzone Trivalent)   Bayer DCA Hb A1c Waived    Fonda Levins, MD [facility] 05/06/2024, 1:44 PM

## 2024-05-11 DIAGNOSIS — G5631 Lesion of radial nerve, right upper limb: Secondary | ICD-10-CM | POA: Diagnosis not present

## 2024-05-11 DIAGNOSIS — G8929 Other chronic pain: Secondary | ICD-10-CM | POA: Diagnosis not present

## 2024-05-11 DIAGNOSIS — M25511 Pain in right shoulder: Secondary | ICD-10-CM | POA: Diagnosis not present

## 2024-05-11 DIAGNOSIS — M19011 Primary osteoarthritis, right shoulder: Secondary | ICD-10-CM | POA: Diagnosis not present

## 2024-05-16 ENCOUNTER — Ambulatory Visit: Payer: Self-pay | Admitting: Family Medicine

## 2024-05-16 NOTE — Telephone Encounter (Signed)
 FYI Only or Action Required?: Action required by provider: update on patient condition.  Patient was last seen in primary care on 05/06/2024 by Dettinger, Fonda LABOR, MD.  Called Nurse Triage reporting Fall.  Triage Disposition: See PCP When Office is Open (Within 3 Days)  Patient/caregiver understands and will follow disposition?: Yes            Copied from CRM [id number]. Topic: Clinical - Red Word Triage >> May 16, 2024  8:58 AM Krystal [name] wrote: Red Word that prompted transfer to Nurse Triage: Krystal [name] yesterday, daughter in law calling Krystal [name] Reason for Disposition  [1] Taking Coumadin (warfarin) or other strong blood thinner AND [2] falling is a recurrent problem  Answer Assessment - Initial Assessment Questions This RN spoke with pt's daughter in law, Krystal [name]. Krystal is hoping pt can go back to rehab to strengthen legs so that pt does not have to go to a care home. Krystal is interested in home health care to check in on patient. Per Krystal, pt has been staggering more often. This RN scheduled pt for first available appt on 10/15 with PCP in office. This RN added pt to appointment wait list for a sooner appointment. This RN educated pt daughter in law on new-worsening symptoms and when to call back/seek emergent care. Pt daughter in law verbalized understanding and agrees to plan.   MECHANISM: How did the fall happen?     Pt called Krystal [name] last night and said, I need someone to get me off of the floor. Pt had fallen after her feet went. Pt's shoes were sitting as such that she had taken them off and sat down in floor. Pt had socks on.  LOCATION: What part of the body hit the ground? (e.g., back, buttocks, head, hips, knees, hands, head, stomach)     L knee is bruised; denies pt hitting head; pt does take Eliquis   Protocols used: Falls and [facility]

## 2024-05-16 NOTE — Telephone Encounter (Signed)
 Appt made.

## 2024-05-18 ENCOUNTER — Encounter: Payer: Self-pay | Admitting: Family Medicine

## 2024-05-18 ENCOUNTER — Ambulatory Visit (INDEPENDENT_AMBULATORY_CARE_PROVIDER_SITE_OTHER)

## 2024-05-18 ENCOUNTER — Ambulatory Visit (INDEPENDENT_AMBULATORY_CARE_PROVIDER_SITE_OTHER): Admitting: Family Medicine

## 2024-05-18 VITALS — BP 99/58 | HR 84 | Ht 63.0 in | Wt 143.0 lb

## 2024-05-18 DIAGNOSIS — R29898 Other symptoms and signs involving the musculoskeletal system: Secondary | ICD-10-CM | POA: Diagnosis not present

## 2024-05-18 DIAGNOSIS — R296 Repeated falls: Secondary | ICD-10-CM | POA: Diagnosis not present

## 2024-05-18 DIAGNOSIS — M19011 Primary osteoarthritis, right shoulder: Secondary | ICD-10-CM | POA: Diagnosis not present

## 2024-05-18 DIAGNOSIS — M25511 Pain in right shoulder: Secondary | ICD-10-CM | POA: Diagnosis not present

## 2024-05-18 DIAGNOSIS — H548 Legal blindness, as defined in USA: Secondary | ICD-10-CM

## 2024-05-18 DIAGNOSIS — R2689 Other abnormalities of gait and mobility: Secondary | ICD-10-CM | POA: Diagnosis not present

## 2024-05-18 NOTE — Progress Notes (Signed)
 BP (!) 99/58   Pulse 84   Ht 5' 3 (1.6 m)   Wt 143 lb (64.9 kg)   SpO2 93%   BMI 25.33 kg/m    Subjective:   Patient ID: Krystal [name], female    DOB: [birth year], 83 y.o.   MRN: [id number]  HPI: Krystal [name] is a 83 y.o. female presenting on 05/18/2024 for Fall (Right shoulder pain, left knee pain)   Discussed the use of AI scribe software for clinical note transcription with the patient, who gave verbal consent to proceed.  History of Present Illness   Krystal [name] is an 83 year old female who presents with a fall and subsequent shoulder pain.  Fall event - Fall occurred on Saturday evening at home while alone - Incident took place in the bathroom while attempting to pull up pants - No dizziness, lightheadedness, or loss of consciousness at the time of the fall - Describes lack of control over her legs at the time, stating she 'just didn't have no control' - History of previous falls within a short period of time  Shoulder pain - Onset following the fall, after landing on her shoulder - Pain localized around the shoulder area - Pain worsened by movement  Lower extremity pain - Ongoing pain in both legs, present prior to the fall - Pain extends from ankles up to knees - Describes pain as 'causing my bones to hurt' - Seeking further evaluation to determine the cause of leg pain  Orthopedic history - History of wearing a brace for a previous hand issue - Previous hand issue managed by an orthopedic specialist          Relevant past medical, surgical, family and social history reviewed and updated as indicated. Interim medical history since our last visit reviewed. Allergies and medications reviewed and updated.  Review of Systems  Constitutional:  Negative for chills and fever.  Eyes:  Negative for visual disturbance.  Respiratory:  Negative for chest tightness and shortness of breath.   Cardiovascular:  Negative for chest pain and leg swelling.   Genitourinary:  Negative for difficulty urinating and dysuria.  Musculoskeletal:  Positive for arthralgias and myalgias. Negative for back pain and gait problem.  Skin:  Negative for rash.  Neurological:  Positive for weakness. Negative for light-headedness and headaches.  Psychiatric/Behavioral:  Negative for agitation and behavioral problems.   All other systems reviewed and are negative.   Per HPI unless specifically indicated above   Allergies as of 05/18/2024       Reactions   Actonel [risedronate] Nausea And Vomiting, Other (See Comments)   Throat tightness   Fosamax [alendronate Sodium] Other (See Comments)   esophagitis   Mevacor [lovastatin] Other (See Comments)   myalgias   Miacalcin [calcitonin (salmon)] Nausea And Vomiting   Mobic  [meloxicam ] Hives, Itching   rash        Medication List        Accurate as of May 18, 2024  3:29 PM. If you have any questions, ask your nurse or doctor.          acetaminophen  500 MG tablet Commonly known as: TYLENOL  Take 2 tablets (1,000 mg total) by mouth every 6 (six) hours as needed for mild pain (pain score 1-3) or moderate pain (pain score 4-6).   Calcium  Citrate-Vitamin D  315-250 MG-UNIT Tabs Commonly known as: Calcium  Citrate + D Take 2 tablets by mouth daily.   diltiazem  120 MG 24 hr capsule Commonly  known as: Cardizem  CD Take 1 capsule (120 mg total) by mouth daily.   docusate sodium  100 MG capsule Commonly known as: COLACE Take 1 capsule (100 mg total) by mouth 2 (two) times daily.   Eliquis  5 MG Tabs tablet Generic drug: apixaban  TAKE 1 TABLET BY MOUTH TWICE A DAY   esomeprazole  40 MG capsule Commonly known as: NEXIUM  Take 1 capsule (40 mg total) by mouth daily.   famotidine  20 MG tablet Commonly known as: PEPCID  TAKE ONE TABLET DAILY AFTER SUPPER   ferrous sulfate  325 (65 FE) MG tablet Take 1 tablet (325 mg total) by mouth at bedtime.   ipratropium-albuterol  0.5-2.5 (3) MG/3ML  Soln Commonly known as: DUONEB Take 3 mLs by nebulization every 6 (six) hours as needed. Dx:  Asthma 493.90   linaclotide  72 MCG capsule Commonly known as: Linzess  Take 1 capsule (72 mcg total) by mouth daily before breakfast.   methocarbamol  500 MG tablet Commonly known as: ROBAXIN  Take 1 tablet (500 mg total) by mouth every 8 (eight) hours as needed for muscle spasms.   metoprolol  tartrate 25 MG tablet Commonly known as: LOPRESSOR  Take 0.5 tablets (12.5 mg total) by mouth 2 (two) times daily.   montelukast  10 MG tablet Commonly known as: SINGULAIR  TAKE 1 TABLET BY MOUTH EVERYDAY AT BEDTIME   polyethylene glycol 17 g packet Commonly known as: MIRALAX  / GLYCOLAX  Take 17 g by mouth daily as needed for mild constipation.   PRESERVISION/LUTEIN PO Take 1 capsule by mouth at bedtime.   rosuvastatin  20 MG tablet Commonly known as: CRESTOR  TAKE 1 TABLET BY MOUTH EVERY DAY   sertraline  100 MG tablet Commonly known as: ZOLOFT  Take 1 tablet (100 mg total) by mouth daily.   Vitamin D3 125 MCG (5000 UT) Caps Take 5,000 Units by mouth daily.         Objective:   BP (!) 99/58   Pulse 84   Ht 5' 3 (1.6 m)   Wt 143 lb (64.9 kg)   SpO2 93%   BMI 25.33 kg/m   Wt Readings from Last 3 Encounters:  05/18/24 143 lb (64.9 kg)  05/06/24 141 lb (64 kg)  02/03/24 144 lb (65.3 kg)    Physical Exam Vitals and nursing note reviewed.  Constitutional:      General: She is not in acute distress.    Appearance: She is well-developed. She is not diaphoretic.  Eyes:     Conjunctiva/sclera: Conjunctivae normal.  Cardiovascular:     Rate and Rhythm: Normal rate and regular rhythm.     Heart sounds: Normal heart sounds. No murmur heard. Pulmonary:     Effort: Pulmonary effort is normal. No respiratory distress.     Breath sounds: Normal breath sounds. No wheezing.  Musculoskeletal:     Right shoulder: Tenderness, bony tenderness and crepitus present. Decreased range of motion.      Right lower leg: Tenderness present. No swelling, deformity, lacerations or bony tenderness. No edema.     Left lower leg: Tenderness present. No swelling, deformity, lacerations or bony tenderness. No edema.  Skin:    General: Skin is warm and dry.     Findings: No rash.  Neurological:     Mental Status: She is alert and oriented to person, place, and time.     Coordination: Coordination normal.  Psychiatric:        Behavior: Behavior normal.                 Assessment & Plan:  Problem List Items Addressed This Visit       Other   Legally blind   Other Visit Diagnoses       Recurrent falls    -  Primary     Weakness of both lower extremities         Acute pain of right shoulder       Relevant Orders   DG Shoulder Right     Balance disorder              Right shoulder pain after fall Right shoulder pain post-fall, worsened by movement. No prior shoulder imaging.  Recurrent falls Recurrent falls without dizziness or loss of consciousness. Reports leg control issues. Prefers [facility] for rehabilitation. - Discuss rehabilitation options, including attending [facility] for daily sessions. - Verify insurance coverage for rehabilitation services.  Chronic bilateral lower leg pain Chronic bilateral leg pain from ankles to knees, pre-existing before fall. Under orthopedic care for hand condition. - Advise contacting orthopedic specialist, Dr. Pablo, to discuss chronic leg pain and explore potential causes and treatments.  Goals of Care Designating a family member as Advertising account executive. Form requires notary and witnesses. - Complete healthcare agent designation form in front of a notary and two witnesses.  Follow-up Follow-up plans discussed for wellness call and potential rescheduling. - Reschedule wellness call if needed by speaking with front office staff.     Will do an FL 2 for her to get to a rehab facility.     Follow up plan: No  follow-ups on file.  Counseling provided for all of the vaccine components Orders Placed This Encounter  Procedures   DG Shoulder Right    Fonda Levins, MD [facility] 05/18/2024, 3:29 PM

## 2024-05-19 ENCOUNTER — Ambulatory Visit

## 2024-05-19 ENCOUNTER — Ambulatory Visit: Payer: Self-pay | Admitting: Family Medicine

## 2024-05-19 ENCOUNTER — Telehealth: Payer: Self-pay

## 2024-05-19 NOTE — Telephone Encounter (Signed)
 Patients son, Krystal [name], returned missed call. I made him aware of Cathys note. Krystal [name] voiced understanding.    Copied from CRM [id number]. Topic: General - Other >> May 19, 2024  4:39 PM Krystal [name] wrote: Reason for CRM: Patients son Krystal [name]) would like the F2 form that Krystal [name] will sign to go to [facility] at [facility] as [facility] in [place] is currently booked up with patients. He would also like to speak to Krystal [name] or a nurse regarding this as well.  [phone]

## 2024-05-20 NOTE — Telephone Encounter (Signed)
 Unable to complete call. FL2 re-typed and placed on PCP's desk

## 2024-05-25 ENCOUNTER — Telehealth: Payer: Self-pay | Admitting: Family Medicine

## 2024-05-25 ENCOUNTER — Telehealth: Payer: Self-pay

## 2024-05-25 NOTE — Telephone Encounter (Signed)
 Copied from CRM [id number]. Topic: General - Other >> May 25, 2024 11:09 AM Everette C wrote: Reason for CRM: Aja with the patient's [employer] member services has called to request prior authorization for the patient's home health aide   PA can be submitted to the pre certification department at [phone] via fax  and [phone] via phone   Please contact Pinkey further at if needed

## 2024-05-26 NOTE — Telephone Encounter (Signed)
 FL2 faxed to [facility] at [phone] yesterday.

## 2024-05-26 NOTE — Telephone Encounter (Signed)
 Called 3 different #s w/n Krystal [name] [phone], [phone] & [phone]. Each representative unable to give me the name of the [facility] agency that the aide is from so that I may call the [facility] agency to let them know that a PA is needed by pt's insurance. PCP offices do not do PA's for any HH services. This will not be completed by us .

## 2024-05-26 NOTE — Telephone Encounter (Signed)
 LMOVM to return call about FL2 that was faxed yesterday gave my direct ph# [phone]

## 2024-05-27 NOTE — Telephone Encounter (Signed)
 Talked w/ Leita to be admitted for inpatient rehab, pt would have to be admitted to the hospital first for a period of time, assessed for the need of inpatient therapy to be admitted.  Informed son Garen of above, he verbalizes understanding. He expressed needing help in the home w/ pt. She does have MCD will fill out PCS form. Told him I will fax it in to the agency they will come do an assessment and they determine if they are able to provide services, what type and how much.

## 2024-05-29 DIAGNOSIS — S42201A Unspecified fracture of upper end of right humerus, initial encounter for closed fracture: Secondary | ICD-10-CM | POA: Diagnosis not present

## 2024-05-30 NOTE — Telephone Encounter (Signed)
 Informed Krystal [name] that ppw for PCS was faxed & they will contact him to set up a time to do an evaluation for services. Also cancelled appt for 06/20/24, pt did not need HH services.

## 2024-06-01 ENCOUNTER — Other Ambulatory Visit: Payer: Self-pay | Admitting: Cardiology

## 2024-06-09 DIAGNOSIS — B351 Tinea unguium: Secondary | ICD-10-CM | POA: Diagnosis not present

## 2024-06-20 ENCOUNTER — Ambulatory Visit: Admitting: Family Medicine

## 2024-06-21 NOTE — Progress Notes (Unsigned)
 Cardiology Office Note:   Date:  06/23/2024  ID:  Krystal [name], DOB 01-Aug-1941, MRN [id number] PCP: Dettinger, Fonda LABOR, MD  [facility] Providers Cardiologist:  Lynwood Schilling, MD {  History of Present Illness:   Krystal [name] is a 83 y.o. female who presents for follow up of SVT.    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  I sent her for this because of ongoing chest pain.  She did have a large hiatal hernia.   She was last in the hospital in Sept 2022.  She had acute respiratory failure.  She was treated for COPD.  She had PAF.     Since I last saw her she continues to have dyspnea which she thinks has slowly progressed.  It is a little bit difficult to quantify or qualify but it might be about 20 yards and she will start to get short of breath.  She does not report substernal chest pressure but she has a band of discomfort that goes underneath both breasts.  She does not have presyncope or syncope but she says she has had low blood pressure and has had to stop her beta-blocker.  She walks with a walker.  She sleeps on the couch because it is closer to the bathroom.  She is not describing PND or orthopnea.  She has not had any new palpitations.  She has had no weight gain or edema.  She denies any cough fevers or chills.  ROS: As stated in the HPI and negative for all other systems.  Studies Reviewed:    EKG:   EKG Interpretation Date/Time:  Wednesday June 22 2024 15:40:13 EST Ventricular Rate:  84 PR Interval:  166 QRS Duration:  64 QT Interval:  374 QTC Calculation: 441 R Axis:   55  Text Interpretation: Normal sinus rhythm Nonspecific ST abnormality When compared with ECG of 13-Mar-2023 17:51, Sinus rhythm has replaced Atrial fibrillation Confirmed by Schilling Lynwood ([id number]) on 06/23/2024 9:17:40 AM NA  Risk Assessment/Calculations:              Physical Exam:   VS:  BP (!) 100/50   Pulse 84   Ht 5' 3 (1.6 m)   Wt 137 lb  (62.1 kg)   BMI 24.27 kg/m    Wt Readings from Last 3 Encounters:  06/22/24 137 lb (62.1 kg)  05/18/24 143 lb (64.9 kg)  05/06/24 141 lb (64 kg)     GEN: Well nourished, well developed in no acute distress NECK: No JVD; No carotid bruits CARDIAC: RRR, no murmurs, rubs, gallops RESPIRATORY:  Clear to auscultation without rales, wheezing or rhonchi  ABDOMEN: Soft, non-tender, non-distended EXTREMITIES:  No edema; No deformity   ASSESSMENT AND PLAN:    CHEST PAIN:   The patient has an atypical chest pain but given this and her dyspnea and the previous left main disease I am gena reassess.  She would not be able to walk on a treadmill so I will schedule PET testing.   PAF:    She tolerates anticoagulation.  No change in therapy.   DYSLIPIDEMIA:  LDL was 47 in March.  No change in therapy.  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  When I see her back if there is no other cause of her dyspnea.    DYSPNEA:   This will be evaluated as above.       Follow  up with me in two months.   Signed, Lynwood Schilling, MD

## 2024-06-22 ENCOUNTER — Ambulatory Visit (INDEPENDENT_AMBULATORY_CARE_PROVIDER_SITE_OTHER): Admitting: Cardiology

## 2024-06-22 ENCOUNTER — Encounter: Payer: Self-pay | Admitting: Cardiology

## 2024-06-22 VITALS — BP 100/50 | HR 84 | Ht 63.0 in | Wt 137.0 lb

## 2024-06-22 DIAGNOSIS — I509 Heart failure, unspecified: Secondary | ICD-10-CM

## 2024-06-22 DIAGNOSIS — I38 Endocarditis, valve unspecified: Secondary | ICD-10-CM

## 2024-06-22 DIAGNOSIS — I480 Paroxysmal atrial fibrillation: Secondary | ICD-10-CM

## 2024-06-22 DIAGNOSIS — R072 Precordial pain: Secondary | ICD-10-CM

## 2024-06-22 DIAGNOSIS — E785 Hyperlipidemia, unspecified: Secondary | ICD-10-CM | POA: Diagnosis not present

## 2024-06-22 DIAGNOSIS — R0602 Shortness of breath: Secondary | ICD-10-CM

## 2024-06-22 NOTE — Patient Instructions (Addendum)
 Medication Instructions:  Your physician recommends that you continue on your current medications as directed. Please refer to the Current Medication list given to you today.  Labwork: Hemoglobin & Hematocrit  Testing/Procedures: Cardiac PET  Follow-Up: Your physician recommends that you schedule a follow-up appointment in: 2 months  Any Other Special Instructions Will Be Listed Below (If Applicable).  If you need a refill on your cardiac medications before your next appointment, please call your pharmacy.     Please report to Radiology at the [facility] Main Entrance 30 minutes early for your test.  [address] [place], [place] [address]                         How to Prepare for Your Cardiac PET/CT Stress Test:  Nothing to eat or drink, except water, 3 hours prior to arrival time.  NO caffeine/decaffeinated products, or chocolate 12 hours prior to arrival. (Please note decaffeinated beverages (teas/coffees) still contain caffeine).  If you have caffeine within 12 hours prior, the test will need to be rescheduled.  Medication instructions: Do not take erectile dysfunction medications for 72 hours prior to test (sildenafil, tadalafil) Do not take nitrates (isosorbide mononitrate, Ranexa) the day before or day of test Do not take tamsulosin the day before or morning of test Hold theophylline  containing medications for 12 hours. Hold Dipyridamole 48 hours prior to the test.  Diabetic Preparation: If able to eat breakfast prior to 3 hour fasting, you may take all medications, including your insulin . Do not worry if you miss your breakfast dose of insulin  - start at your next meal. If you do not eat prior to 3 hour fast-Hold all diabetes (oral and insulin ) medications. Patients who wear a continuous glucose monitor MUST remove the device prior to scanning.  You may take your remaining medications with water.  NO perfume, cologne or lotion on chest or abdomen  area. FEMALES - Please avoid wearing dresses to this appointment.  Total time is 1 to 2 hours; you may want to bring reading material for the waiting time.  IF YOU THINK YOU MAY BE PREGNANT, OR ARE NURSING PLEASE INFORM THE TECHNOLOGIST.  In preparation for your appointment, medication and supplies will be purchased.  Appointment availability is limited, so if you need to cancel or reschedule, please call the Radiology Department Scheduler at [phone] 24 hours in advance to avoid a cancellation fee of $100.00  What to Expect When you Arrive:  Once you arrive and check in for your appointment, you will be taken to a preparation room within the Radiology Department.  A technologist or Nurse will obtain your medical history, verify that you are correctly prepped for the exam, and explain the procedure.  Afterwards, an IV will be started in your arm and electrodes will be placed on your skin for EKG monitoring during the stress portion of the exam. Then you will be escorted to the PET/CT scanner.  There, staff will get you positioned on the scanner and obtain a blood pressure and EKG.  During the exam, you will continue to be connected to the EKG and blood pressure machines.  A small, safe amount of a radioactive tracer will be injected in your IV to obtain a series of pictures of your heart along with an injection of a stress agent.    After your Exam:  It is recommended that you eat a meal and drink a caffeinated beverage to counter  act any effects of the stress agent.  Drink plenty of fluids for the remainder of the day and urinate frequently for the first couple of hours after the exam.  Your doctor will inform you of your test results within 7-10 business days.  For more information and frequently asked questions, please visit our website: [email or web address]  For questions about your test or how to prepare for your test, please call: Cardiac Imaging Nurse Navigators Office:  [phone]

## 2024-06-23 ENCOUNTER — Telehealth: Payer: Self-pay | Admitting: *Deleted

## 2024-06-23 ENCOUNTER — Ambulatory Visit: Payer: Self-pay

## 2024-06-23 ENCOUNTER — Encounter: Payer: Self-pay | Admitting: *Deleted

## 2024-06-23 ENCOUNTER — Encounter: Payer: Self-pay | Admitting: Cardiology

## 2024-06-23 NOTE — Telephone Encounter (Signed)
 Spoke with pt. She states that she does not need an appt and that she has not been confused. States that it is her daughter in law that is calling it requesting an appt. Pt states to listen to her and not her daughter in law. Pt has f/u 12/18.

## 2024-06-23 NOTE — Telephone Encounter (Signed)
 Cardiac PET instructions reviewed with patient. Patient informed and verbalized understanding of plan. Copy mailed to home address.

## 2024-06-23 NOTE — Telephone Encounter (Signed)
 FYI Only or Action Required?: Action required by provider: request for appointment and update on patient condition.  Patient was last seen in primary care on 05/18/2024 by Dettinger, Fonda LABOR, MD.  Called Nurse Triage reporting Altered Mental Status.  Symptoms began several months ago.  Symptoms are: unchanged.  Triage Disposition: See PCP Within 2 Weeks  Patient/caregiver understands and will follow disposition?: Yes (Except no availability)           Copied from CRM #[id number]. Topic: Clinical - Red Word Triage >> Jun 23, 2024  1:01 PM Joesph NOVAK wrote: Red Word that prompted transfer to Nurse Triage: Confusion, patient dropping her medication and forgetting to take it. Memory loss Reason for Disposition  [1] Longstanding confusion (e.g., dementia, stroke) AND [2] NO worsening or change  Answer Assessment - Initial Assessment Questions This RN spoke with pt's daughter in law, Roseto. This RN recommends pt is seen within 2 weeks but Dr. Maryanne does not have availability until 12/18. Betti states pt will not go for a morning appt (all that is available 12/18) and needs afternoon. Selina is hoping pt can be worked into the schedule to be seen sooner. This RN will send a HP message to clinic. Selina requests a call back today: [phone]    Onset: several months Forgetful Dropping her medications Been going on for quite a while but Selina states she does not think the pt is telling Dr. Maryanne when she sees him Denies any other symptoms  PATTERN: Does this come and go, or has it been constant since it started?  Is it present now?     Intermittent  Protocols used: Confusion - Delirium-A-AH

## 2024-06-23 NOTE — Telephone Encounter (Signed)
 Contacted to review Cardiac PET instructions.Spoke with son Garen [facility]) who says patient is still in bed. Son advised that patient did not need to do blood work for this test as instructed on yesterday. Advised to have patient call when she wakes up to review Cardiac PET instructions.  Verbalized understanding.

## 2024-06-24 NOTE — Addendum Note (Signed)
 Addended by: Reshard Guillet M on: 06/24/2024 10:15 AM   Modules accepted: Orders

## 2024-06-27 DIAGNOSIS — H353124 Nonexudative age-related macular degeneration, left eye, advanced atrophic with subfoveal involvement: Secondary | ICD-10-CM | POA: Diagnosis not present

## 2024-06-27 DIAGNOSIS — H353113 Nonexudative age-related macular degeneration, right eye, advanced atrophic without subfoveal involvement: Secondary | ICD-10-CM | POA: Diagnosis not present

## 2024-06-29 DIAGNOSIS — S42201A Unspecified fracture of upper end of right humerus, initial encounter for closed fracture: Secondary | ICD-10-CM | POA: Diagnosis not present

## 2024-07-17 DIAGNOSIS — I10 Essential (primary) hypertension: Secondary | ICD-10-CM | POA: Diagnosis not present

## 2024-07-17 DIAGNOSIS — R058 Other specified cough: Secondary | ICD-10-CM | POA: Diagnosis not present

## 2024-07-17 DIAGNOSIS — R059 Cough, unspecified: Secondary | ICD-10-CM | POA: Diagnosis not present

## 2024-07-17 DIAGNOSIS — J441 Chronic obstructive pulmonary disease with (acute) exacerbation: Secondary | ICD-10-CM | POA: Diagnosis not present

## 2024-07-17 DIAGNOSIS — Z79899 Other long term (current) drug therapy: Secondary | ICD-10-CM | POA: Diagnosis not present

## 2024-07-17 DIAGNOSIS — K449 Diaphragmatic hernia without obstruction or gangrene: Secondary | ICD-10-CM | POA: Diagnosis not present

## 2024-07-17 DIAGNOSIS — Z87891 Personal history of nicotine dependence: Secondary | ICD-10-CM | POA: Diagnosis not present

## 2024-07-17 DIAGNOSIS — J9859 Other diseases of mediastinum, not elsewhere classified: Secondary | ICD-10-CM | POA: Diagnosis not present

## 2024-07-17 DIAGNOSIS — Z90710 Acquired absence of both cervix and uterus: Secondary | ICD-10-CM | POA: Diagnosis not present

## 2024-07-17 DIAGNOSIS — E785 Hyperlipidemia, unspecified: Secondary | ICD-10-CM | POA: Diagnosis not present

## 2024-07-17 DIAGNOSIS — R053 Chronic cough: Secondary | ICD-10-CM | POA: Diagnosis not present

## 2024-07-18 ENCOUNTER — Telehealth: Payer: Self-pay | Admitting: Family Medicine

## 2024-07-18 ENCOUNTER — Other Ambulatory Visit: Payer: Self-pay | Admitting: Cardiology

## 2024-07-18 NOTE — Telephone Encounter (Unsigned)
 Copied from CRM #[id number]. Topic: Appointments - Appointment Scheduling >> Jul 18, 2024  2:31 PM Leonette P wrote: Patient/patient representative is calling to schedule an appointment. Refer to attachments for appointment information.   Pt called saying transportation needs a note saying she was in the office last.  She said they won't continue to give her rides until they get faxes of her appts.

## 2024-07-19 ENCOUNTER — Telehealth (HOSPITAL_COMMUNITY): Payer: Self-pay | Admitting: *Deleted

## 2024-07-19 NOTE — Telephone Encounter (Signed)
 Reaching out to patient to offer assistance regarding upcoming cardiac imaging study; pt verbalizes understanding of appt date/time, parking situation and where to check in, pre-test NPO status, and verified current allergies; name and call back number provided for further questions should they arise  Krystal Brick RN Navigator Cardiac Imaging [facility] [phone] office [phone] cell  Patient aware to avoid caffeine 12 hours prior to her cardiac PET study.

## 2024-07-19 NOTE — Telephone Encounter (Signed)
 Spoke with [facility]. They need a note stating that pt was seen in our office on July 2nd. Note created and faxed to [phone]

## 2024-07-20 ENCOUNTER — Ambulatory Visit (HOSPITAL_COMMUNITY): Admission: RE | Admit: 2024-07-20 | Source: Ambulatory Visit

## 2024-07-20 DIAGNOSIS — R072 Precordial pain: Secondary | ICD-10-CM | POA: Diagnosis not present

## 2024-07-20 DIAGNOSIS — R0602 Shortness of breath: Secondary | ICD-10-CM | POA: Diagnosis present

## 2024-07-20 MED ORDER — REGADENOSON 0.4 MG/5ML IV SOLN
0.4000 mg | Freq: Once | INTRAVENOUS | Status: AC
  Administered 2024-07-20: 12:00:00 0.4 mg via INTRAVENOUS

## 2024-07-20 MED ORDER — REGADENOSON 0.4 MG/5ML IV SOLN
INTRAVENOUS | Status: AC
  Filled 2024-07-20: qty 5

## 2024-07-20 MED ORDER — RUBIDIUM RB82 GENERATOR (RUBYFILL)
16.5800 | PACK | Freq: Once | INTRAVENOUS | Status: AC
  Administered 2024-07-20: 12:00:00 16.58 via INTRAVENOUS

## 2024-07-20 MED ORDER — RUBIDIUM RB82 GENERATOR (RUBYFILL)
16.5700 | PACK | Freq: Once | INTRAVENOUS | Status: AC
  Administered 2024-07-20: 12:00:00 16.57 via INTRAVENOUS

## 2024-07-21 ENCOUNTER — Ambulatory Visit: Payer: Self-pay

## 2024-07-21 ENCOUNTER — Other Ambulatory Visit: Payer: Self-pay | Admitting: Family Medicine

## 2024-07-21 ENCOUNTER — Ambulatory Visit

## 2024-07-21 DIAGNOSIS — I480 Paroxysmal atrial fibrillation: Secondary | ICD-10-CM

## 2024-07-21 DIAGNOSIS — R600 Localized edema: Secondary | ICD-10-CM

## 2024-07-21 LAB — NM PET CT CARDIAC PERFUSION MULTI W/ABSOLUTE BLOODFLOW
MBFR: 1.43
Nuc Rest EF: 62 %
Nuc Stress EF: 62 %
Rest MBF: 0.98 ml/g/min
Rest Nuclear Isotope Dose: 16.6 mCi
ST Depression (mm): 0 mm
Stress MBF: 1.4 ml/g/min
Stress Nuclear Isotope Dose: 16.6 mCi
TID: 1.09

## 2024-07-24 ENCOUNTER — Ambulatory Visit: Payer: Self-pay | Admitting: Cardiology

## 2024-07-26 ENCOUNTER — Telehealth: Payer: Self-pay | Admitting: Cardiology

## 2024-07-26 DIAGNOSIS — H353211 Exudative age-related macular degeneration, right eye, with active choroidal neovascularization: Secondary | ICD-10-CM | POA: Diagnosis not present

## 2024-07-26 NOTE — Telephone Encounter (Signed)
 See results follow up from 12/21

## 2024-07-26 NOTE — Telephone Encounter (Signed)
  Patient is returning call regarding results 

## 2024-08-02 DIAGNOSIS — I480 Paroxysmal atrial fibrillation: Secondary | ICD-10-CM

## 2024-08-02 NOTE — Telephone Encounter (Signed)
 Copied from CRM [id number]. Topic: Clinical - Medication Refill >> Aug 02, 2024  2:41 PM Wess S wrote: Medication: ELIQUIS  5 MG TABS tablet   Has the patient contacted their pharmacy? Yes (Agent: If no, request that the patient contact the pharmacy for the refill. If patient does not wish to contact the pharmacy document the reason why and proceed with request.) (Agent: If yes, when and what did the pharmacy advise?) Call doctor  This is the patient's preferred pharmacy:  [id number] - [place], [place] - [address] [place] [address] Phone: [phone] Fax: [phone]   Is this the correct pharmacy for this prescription? Yes If no, delete pharmacy and type the correct one.   Has the prescription been filled recently? Yes  Is the patient out of the medication? No  Has the patient been seen for an appointment in the last year OR does the patient have an upcoming appointment? Yes  Can we respond through MyChart? No  Agent: Please be advised that Rx refills may take up to 3 business days. We ask that you follow-up with your pharmacy.

## 2024-08-03 NOTE — Addendum Note (Signed)
 Addended by: GRETTA LAURAINE HERO on: 08/03/2024 07:36 AM   Modules accepted: Orders

## 2024-08-05 MED ORDER — APIXABAN 5 MG PO TABS: 5.0000 mg | ORAL_TABLET | Freq: Two times a day (BID) | ORAL | 1 refills | Status: AC

## 2024-08-05 NOTE — Addendum Note (Signed)
 Addended by: DARRELL BAREFOOT on: 08/05/2024 01:15 PM   Modules accepted: Orders

## 2024-08-05 NOTE — Telephone Encounter (Signed)
 Prescription refill request for Eliquis  received. Indication: a fib Last office visit: 06/22/24 Scr:  0.88 epic 02/03/24 Age: 84 Weight: 62kg

## 2024-08-10 ENCOUNTER — Ambulatory Visit (INDEPENDENT_AMBULATORY_CARE_PROVIDER_SITE_OTHER)

## 2024-08-10 ENCOUNTER — Encounter: Payer: Self-pay | Admitting: Family Medicine

## 2024-08-10 ENCOUNTER — Ambulatory Visit: Payer: Self-pay | Admitting: Family Medicine

## 2024-08-10 VITALS — BP 112/66 | HR 96 | Ht 63.0 in | Wt 136.0 lb

## 2024-08-10 DIAGNOSIS — I480 Paroxysmal atrial fibrillation: Secondary | ICD-10-CM | POA: Diagnosis not present

## 2024-08-10 DIAGNOSIS — I5042 Chronic combined systolic (congestive) and diastolic (congestive) heart failure: Secondary | ICD-10-CM | POA: Diagnosis not present

## 2024-08-10 DIAGNOSIS — Z853 Personal history of malignant neoplasm of breast: Secondary | ICD-10-CM

## 2024-08-10 DIAGNOSIS — J42 Unspecified chronic bronchitis: Secondary | ICD-10-CM | POA: Diagnosis not present

## 2024-08-10 DIAGNOSIS — E782 Mixed hyperlipidemia: Secondary | ICD-10-CM | POA: Diagnosis not present

## 2024-08-10 DIAGNOSIS — R7303 Prediabetes: Secondary | ICD-10-CM

## 2024-08-10 DIAGNOSIS — R413 Other amnesia: Secondary | ICD-10-CM | POA: Diagnosis not present

## 2024-08-10 DIAGNOSIS — R600 Localized edema: Secondary | ICD-10-CM

## 2024-08-10 DIAGNOSIS — R222 Localized swelling, mass and lump, trunk: Secondary | ICD-10-CM

## 2024-08-10 LAB — BAYER DCA HB A1C WAIVED: HB A1C (BAYER DCA - WAIVED): 5.9 % — ABNORMAL HIGH (ref 4.8–5.6)

## 2024-08-10 MED ORDER — METOPROLOL TARTRATE 25 MG PO TABS: 25.0000 mg | ORAL_TABLET | Freq: Two times a day (BID) | ORAL | 3 refills | Status: AC

## 2024-08-10 NOTE — Progress Notes (Signed)
 "  BP 112/66   Pulse 96   Ht 5' 3 (1.6 m)   Wt 136 lb (61.7 kg)   SpO2 96%   BMI 24.09 kg/m    Subjective:   Patient ID: Krystal [name], female    DOB: Jul 12, 1941, 84 y.o.   MRN: [id number]  HPI: Krystal [name] is a 84 y.o. female presenting on 08/10/2024 for Medical Management of Chronic Issues, Hyperlipidemia, Anxiety, and Asthma   Discussed the use of AI scribe software for clinical note transcription with the patient, who gave verbal consent to proceed.  History of Present Illness   Krystal [name] is an 84 year old female who presents with frequent falls and sleep disturbances. She is accompanied by her family members.  Gait instability and falls - Frequent falls occurring recently, described as sudden and without warning - Uses a walker for ambulation and carries a phone for safety - Engages in daily leg exercises - Not interested in pursuing physical therapy again  Sleep disturbances - Significant difficulty maintaining sleep, with sleep intervals of only 30 to 45 minutes before awakening - Does not nap during the day unless briefly out - Melatonin trial without improvement - Considering Tylenol  PM for sleep  Post-mastectomy discomfort - History of mastectomy - Discomfort localized to the mastectomy scar area - Sensation of a bone attempting to protrude near the scar, present for a couple of months - Painful to palpation  Hearing impairment - Hearing aids do not fit properly and frequently fall out - Impaired hearing due to poor fit of hearing aids  Cognitive concerns - Family expresses concern regarding possible memory decline - Family considering over-the-counter options to support memory  Laboratory evaluation - No recent fasting blood work performed - Prepared for fasting blood work today          Relevant past medical, surgical, family and social history reviewed and updated as indicated. Interim medical history since our last visit  reviewed. Allergies and medications reviewed and updated.  Review of Systems  Constitutional:  Negative for chills and fever.  HENT:  Negative for congestion, ear discharge and ear pain.   Eyes:  Negative for redness and visual disturbance.  Respiratory:  Negative for chest tightness and shortness of breath.   Cardiovascular:  Positive for chest pain. Negative for leg swelling.  Gastrointestinal:  Negative for abdominal pain.  Musculoskeletal:  Positive for arthralgias, back pain and gait problem.  Skin:  Negative for rash.  Neurological:  Negative for light-headedness and headaches.  Psychiatric/Behavioral:  Negative for agitation and behavioral problems.   All other systems reviewed and are negative.   Per HPI unless specifically indicated above   Allergies as of 08/10/2024       Reactions   Actonel [risedronate] Nausea And Vomiting, Other (See Comments)   Throat tightness   Fosamax [alendronate Sodium] Other (See Comments)   esophagitis   Mevacor [lovastatin] Other (See Comments)   myalgias   Miacalcin [calcitonin (salmon)] Nausea And Vomiting   Mobic  [meloxicam ] Hives, Itching   rash        Medication List        Accurate as of August 10, 2024  1:49 PM. If you have any questions, ask your nurse or doctor.          acetaminophen  500 MG tablet Commonly known as: TYLENOL  Take 2 tablets (1,000 mg total) by mouth every 6 (six) hours as needed for mild pain (pain score 1-3) or moderate pain (pain  score 4-6).   apixaban  5 MG Tabs tablet Commonly known as: Eliquis  Take 1 tablet (5 mg total) by mouth 2 (two) times daily.   Calcium  Citrate-Vitamin D  315-250 MG-UNIT Tabs Commonly known as: Calcium  Citrate + D Take 2 tablets by mouth daily.   diltiazem  120 MG 24 hr capsule Commonly known as: Cardizem  CD Take 1 capsule (120 mg total) by mouth daily.   docusate sodium  100 MG capsule Commonly known as: COLACE Take 1 capsule (100 mg total) by mouth 2 (two) times  daily.   esomeprazole  40 MG capsule Commonly known as: NEXIUM  Take 1 capsule (40 mg total) by mouth daily.   famotidine  20 MG tablet Commonly known as: PEPCID  TAKE ONE TABLET DAILY AFTER SUPPER   ferrous sulfate  325 (65 FE) MG tablet Take 1 tablet (325 mg total) by mouth at bedtime.   ipratropium-albuterol  0.5-2.5 (3) MG/3ML Soln Commonly known as: DUONEB Take 3 mLs by nebulization every 6 (six) hours as needed. Dx:  Asthma 493.90   linaclotide  72 MCG capsule Commonly known as: Linzess  Take 1 capsule (72 mcg total) by mouth daily before breakfast.   methocarbamol  500 MG tablet Commonly known as: ROBAXIN  Take 1 tablet (500 mg total) by mouth every 8 (eight) hours as needed for muscle spasms.   metoprolol  tartrate 25 MG tablet Commonly known as: LOPRESSOR  Take 1 tablet (25 mg total) by mouth 2 (two) times daily.   montelukast  10 MG tablet Commonly known as: SINGULAIR  TAKE 1 TABLET BY MOUTH EVERYDAY AT BEDTIME   polyethylene glycol 17 g packet Commonly known as: MIRALAX  / GLYCOLAX  Take 17 g by mouth daily as needed for mild constipation.   PRESERVISION/LUTEIN PO Take 1 capsule by mouth at bedtime.   rosuvastatin  20 MG tablet Commonly known as: CRESTOR  TAKE 1 TABLET BY MOUTH EVERY DAY   sertraline  100 MG tablet Commonly known as: ZOLOFT  Take 1 tablet (100 mg total) by mouth daily.   Vitamin D3 125 MCG (5000 UT) Caps Take 5,000 Units by mouth daily.         Objective:   BP 112/66   Pulse 96   Ht 5' 3 (1.6 m)   Wt 136 lb (61.7 kg)   SpO2 96%   BMI 24.09 kg/m   Wt Readings from Last 3 Encounters:  08/10/24 136 lb (61.7 kg)  06/22/24 137 lb (62.1 kg)  05/18/24 143 lb (64.9 kg)    Physical Exam Physical Exam   CHEST: Lungs clear to auscultation. CARDIOVASCULAR: Heart regular rate and rhythm, no murmurs.     Palpable firm nodules on the incision line on the midaxillary line of her right chest wall.  Palpable and firm, not mobile.  Bony  texture    Assessment & Plan:   Problem List Items Addressed This Visit       Cardiovascular and Mediastinum   Chronic combined systolic and diastolic heart failure (HCC)   Relevant Medications   metoprolol  tartrate (LOPRESSOR ) 25 MG tablet   Other Relevant Orders   CBC with Differential/Platelet   CMP14+EGFR     Respiratory   COPD (chronic obstructive pulmonary disease) (HCC) - Primary   Relevant Orders   CBC with Differential/Platelet   CMP14+EGFR     Other   Mixed hyperlipidemia   Relevant Medications   metoprolol  tartrate (LOPRESSOR ) 25 MG tablet   Other Relevant Orders   Lipid panel   Prediabetes   Relevant Orders   Bayer DCA Hb A1c Waived   Other Visit Diagnoses  Localized edema       Relevant Medications   metoprolol  tartrate (LOPRESSOR ) 25 MG tablet     Paroxysmal atrial fibrillation (HCC)       Relevant Medications   metoprolol  tartrate (LOPRESSOR ) 25 MG tablet     Mass of right chest wall       Relevant Orders   DG Ribs Unilateral W/Chest Right     Memory changes              Recurrent falls with lower extremity weakness Recurrent falls likely due to lower extremity weakness. She is using a walker and performing leg exercises daily. Consideration of increasing exercise frequency or physical therapy was discussed, but she has not been interested in therapy. - Increase leg exercises to twice daily. - Ensure use of life alert button at home.  Insomnia Difficulty maintaining sleep, waking after 30-45 minutes. Melatonin was ineffective. Discussed trial of Tylenol  PM as next step. - Try Tylenol  PM at night, one hour before bedtime. - Avoid daytime naps to improve nighttime sleep.  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 Pain localized to area of previous surgery. Differential includes bone spurs or scar tissue. Further imaging needed to evaluate. - Ordered imaging to evaluate  for bone spurs or scar tissue.  Cognitive impairment Concerns about progression towards dementia. Discussed potential use of over-the-counter supplement Privigen for memory support. - Try Privigen supplement for memory support.  Prediabetes Borderline blood sugar levels. Monitoring through blood work. - Ordered blood work to monitor blood sugar levels.  Mixed hyperlipidemia Monitoring cholesterol levels through blood work. - Ordered blood work to check cholesterol levels.  General Health Maintenance Routine health maintenance discussed, including blood work for cholesterol and blood sugar monitoring. - Ordered full blood panel including cholesterol and blood sugar levels.          Follow up plan: Return in about 3 months (around 11/08/2024), or if symptoms worsen or fail to improve, for Prediabetes and COPD and hyperlipidemia recheck.  Counseling provided for all of the vaccine components Orders Placed This Encounter  Procedures   DG Ribs Unilateral W/Chest Right   CBC with Differential/Platelet   CMP14+EGFR   Lipid panel   Bayer DCA Hb A1c Waived    Fonda Levins, MD [facility] 08/10/2024, 1:49 PM     "

## 2024-08-11 LAB — CBC WITH DIFFERENTIAL/PLATELET
Basophils Absolute: 0 x10E3/uL (ref 0.0–0.2)
Basos: 1 %
EOS (ABSOLUTE): 0.2 x10E3/uL (ref 0.0–0.4)
Eos: 2 %
Hematocrit: 44.4 % (ref 34.0–46.6)
Hemoglobin: 14.3 g/dL (ref 11.1–15.9)
Immature Grans (Abs): 0 x10E3/uL (ref 0.0–0.1)
Immature Granulocytes: 0 %
Lymphocytes Absolute: 1.8 x10E3/uL (ref 0.7–3.1)
Lymphs: 27 %
MCH: 30.3 pg (ref 26.6–33.0)
MCHC: 32.2 g/dL (ref 31.5–35.7)
MCV: 94 fL (ref 79–97)
Monocytes Absolute: 0.5 x10E3/uL (ref 0.1–0.9)
Monocytes: 7 %
Neutrophils Absolute: 4.2 x10E3/uL (ref 1.4–7.0)
Neutrophils: 63 %
Platelets: 146 x10E3/uL — ABNORMAL LOW (ref 150–450)
RBC: 4.72 x10E6/uL (ref 3.77–5.28)
RDW: 12.5 % (ref 11.7–15.4)
WBC: 6.7 x10E3/uL (ref 3.4–10.8)

## 2024-08-11 LAB — CMP14+EGFR
ALT: 20 IU/L (ref 0–32)
AST: 27 IU/L (ref 0–40)
Albumin: 4.2 g/dL (ref 3.7–4.7)
Alkaline Phosphatase: 61 IU/L (ref 48–129)
BUN/Creatinine Ratio: 21 (ref 12–28)
BUN: 20 mg/dL (ref 8–27)
Bilirubin Total: 0.5 mg/dL (ref 0.0–1.2)
CO2: 26 mmol/L (ref 20–29)
Calcium: 9.6 mg/dL (ref 8.7–10.3)
Chloride: 102 mmol/L (ref 96–106)
Creatinine, Ser: 0.94 mg/dL (ref 0.57–1.00)
Globulin, Total: 1.9 g/dL (ref 1.5–4.5)
Glucose: 99 mg/dL (ref 70–99)
Potassium: 4.5 mmol/L (ref 3.5–5.2)
Sodium: 144 mmol/L (ref 134–144)
Total Protein: 6.1 g/dL (ref 6.0–8.5)
eGFR: 59 mL/min/1.73 — ABNORMAL LOW

## 2024-08-11 LAB — LIPID PANEL
Chol/HDL Ratio: 1.8 ratio (ref 0.0–4.4)
Cholesterol, Total: 148 mg/dL (ref 100–199)
HDL: 82 mg/dL
LDL Chol Calc (NIH): 50 mg/dL (ref 0–99)
Triglycerides: 87 mg/dL (ref 0–149)
VLDL Cholesterol Cal: 16 mg/dL (ref 5–40)

## 2024-08-16 ENCOUNTER — Telehealth: Payer: Self-pay | Admitting: Family Medicine

## 2024-08-16 NOTE — Telephone Encounter (Signed)
 It states on patients appointment desk and in chart review that patient is scheduled to have a CT done at [facility] on 1/26 at 8:30 AM. This CRM was unnecessary.    Copied from CRM [id number]. Topic: Clinical - Request for Lab/Test Order >> Aug 16, 2024  4:25 PM Alfonso ORN wrote: Reason for CRM: Pt. recieved a  letter that she  was approved for  [facility] to have a scan on her chest  , patient received a call that she would be going to [facility] for the chest scan , patient need clarification where she suppose to be going   Patient perfer to go to [place]

## 2024-08-17 ENCOUNTER — Telehealth: Payer: Self-pay

## 2024-08-17 NOTE — Telephone Encounter (Signed)
 Returned patient call and reviewed lab results, answered all patient questions. Patient verbalized understanding.

## 2024-08-17 NOTE — Telephone Encounter (Signed)
 Copied from CRM [id number]. Topic: Clinical - Lab/Test Results >> Aug 17, 2024 11:58 AM Tobias CROME wrote: Reason for CRM: Patient requesting lab results. Requesting callback: [phone]

## 2024-08-17 NOTE — Telephone Encounter (Signed)
 Spoke with patient and made her aware that CT is scheduled at [facility] on 1/26 and she needs to be there by 8:15 AM. Patient voiced understanding.

## 2024-08-17 NOTE — Telephone Encounter (Signed)
 Like she is already been notified that maybe the letter was from CT scheduling.

## 2024-08-20 ENCOUNTER — Other Ambulatory Visit: Payer: Self-pay | Admitting: Family Medicine

## 2024-08-21 NOTE — Progress Notes (Unsigned)
 " Cardiology Office Note:   Date:  08/24/2024  ID:  Krystal [name], DOB [birth year], MRN [id number] PCP: Dettinger, Fonda LABOR, MD  [facility] Providers Cardiologist:  Lynwood Schilling, MD {  History of Present Illness:   Krystal [name] is a 84 y.o. female who presents for follow up of SVT.    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  I sent her for this because of ongoing chest pain.  She did have a large hiatal hernia.   She was last in the hospital in Sept 2022.  She had acute respiratory failure.  She was treated for COPD.  She had PAF.  At the last visit she had chest pain and a PET scan that did not exclude LM disease with decreased global myocardial blood flow reserve and EF did not increase with stress.    I brought her back to discuss this. She comes back today comes back today with her family.  She has had some chest discomfort she says if she walks a far distance.  However, it does not seem like she ever really walks for distance.  Her family says they never hear her complaining about chest discomfort or shortness of breath.  She is very removed from reporting her symptoms and participating in the conversation.  Her biggest issues weakness.  She has leg tiredness.  She denies shortness of breath at rest, PND and orthopnea.  There have been no palpitations, presyncope or syncope.  She has had no weight gain or edema.  ROS: As stated in the HPI and negative for all other systems.  Studies Reviewed:    EKG:   EKG Interpretation Date/Time:  Wednesday August 24 2024 11:41:56 EST Ventricular Rate:  72 PR Interval:  206 QRS Duration:  64 QT Interval:  394 QTC Calculation: 431 R Axis:   100  Text Interpretation: Normal sinus rhythm with borderline first degree HB Rightward axis When compared with ECG of 22-Jun-2024 15:40, No significant change was found Confirmed by Schilling Rattan ([id number]) on 08/24/2024 12:03:25 PM NA  Risk Assessment/Calculations:     CHA2DS2-VASc Score = 3   This indicates a 3.2% annual risk of stroke. The patient's score is based upon: CHF History: 0 HTN History: 0 Diabetes History: 0 Stroke History: 0 Vascular Disease History: 0 Age Score: 2 Gender Score: 1  Physical Exam:   VS:  BP 120/61   Pulse 72   Ht 5' 3 (1.6 m)   Wt 139 lb 4.8 oz (63.2 kg)   SpO2 96%   BMI 24.68 kg/m    Wt Readings from Last 3 Encounters:  08/24/24 139 lb 4.8 oz (63.2 kg)  08/10/24 136 lb (61.7 kg)  06/22/24 137 lb (62.1 kg)     GEN: Well nourished, well developed in no acute distress NECK: No JVD; No carotid bruits CARDIAC: RRR, 3 out of 6 systolic murmur radiating at the aortic outflow tract, no diastolic murmurs, rubs, gallops RESPIRATORY:  Clear to auscultation without rales, wheezing or rhonchi  ABDOMEN: Soft, non-tender, non-distended EXTREMITIES:  No edema; No deformity   ASSESSMENT AND PLAN:   CHEST PAIN:   It is very difficult to assess her symptoms but I spoke with her and her family today.  We would like to pursue more conservative management in the absence of consistent anginal symptoms or classic anginal symptoms.  I do not think she has an unstable anginal pattern.  She had nonobstructive left main  disease previously.  Though PET testing has not absolutely excluded three-vessel disease I think this would be unlikely and she would be a difficult candidate for intervention on this and certainly probably not a surgical candidate.  If she has suggestive symptoms in the future I would proceed with probably coronary CTA.  No further testing at this point.   PAF:    She is not having any further palpitations.  She tolerates anticoagulation.  No change in therapy.    DYSLIPIDEMIA:  LDL was 50 with an HDL of 82.  No change in therapy.    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  I will follow-up with an echocardiogram.      Follow up with me in 6 months or sooner if needed.  Signed, Lynwood Schilling, MD   "

## 2024-08-24 ENCOUNTER — Encounter: Payer: Self-pay | Admitting: Cardiology

## 2024-08-24 ENCOUNTER — Ambulatory Visit: Admitting: Cardiology

## 2024-08-24 VITALS — BP 120/61 | HR 72 | Ht 63.0 in | Wt 139.3 lb

## 2024-08-24 DIAGNOSIS — E785 Hyperlipidemia, unspecified: Secondary | ICD-10-CM | POA: Diagnosis not present

## 2024-08-24 DIAGNOSIS — R072 Precordial pain: Secondary | ICD-10-CM

## 2024-08-24 DIAGNOSIS — R0602 Shortness of breath: Secondary | ICD-10-CM | POA: Diagnosis not present

## 2024-08-24 DIAGNOSIS — I350 Nonrheumatic aortic (valve) stenosis: Secondary | ICD-10-CM | POA: Diagnosis not present

## 2024-08-24 NOTE — Patient Instructions (Signed)
 Medication Instructions:  Your physician recommends that you continue on your current medications as directed. Please refer to the Current Medication list given to you today.  *If you need a refill on your cardiac medications before your next appointment, please call your pharmacy*  Lab Work: NONE If you have labs (blood work) drawn today and your tests are completely normal, you will receive your results only by: MyChart Message (if you have MyChart) OR A paper copy in the mail If you have any lab test that is abnormal or we need to change your treatment, we will call you to review the results.  Testing/Procedures: Echocardiogram at [facility] Your physician has requested that you have an echocardiogram. Echocardiography is a painless test that uses sound waves to create images of your heart. It provides your doctor with information about the size and shape of your heart and how well your hearts chambers and valves are working. This procedure takes approximately one hour. There are no restrictions for this procedure. Please do NOT wear cologne, perfume, aftershave, or lotions (deodorant is allowed). Please arrive 15 minutes prior to your appointment time.  Please note: We ask at that you not bring children with you during ultrasound (echo/ vascular) testing. Due to room size and safety concerns, children are not allowed in the ultrasound rooms during exams. Our front office staff cannot provide observation of children in our lobby area while testing is being conducted. An adult accompanying a patient to their appointment will only be allowed in the ultrasound room at the discretion of the ultrasound technician under special circumstances. We apologize for any inconvenience.   Follow-Up: At [facility], you and your health needs are our priority.  As part of our continuing mission to provide you with exceptional heart care, our providers are all part of one team.  This team includes  your primary Cardiologist (physician) and Advanced Practice Providers or APPs (Physician Assistants and Nurse Practitioners) who all work together to provide you with the care you need, when you need it.  Your next appointment:   6 month(s)  Provider:   Lynwood Schilling, MD    We recommend signing up for the patient portal called MyChart.  Sign up information is provided on this After Visit Summary.  MyChart is used to connect with patients for Virtual Visits (Telemedicine).  Patients are able to view lab/test results, encounter notes, upcoming appointments, etc.  Non-urgent messages can be sent to your provider as well.   To learn more about what you can do with MyChart, go to [email or web address].

## 2024-08-26 ENCOUNTER — Other Ambulatory Visit: Payer: Self-pay | Admitting: Family Medicine

## 2024-08-29 ENCOUNTER — Ambulatory Visit (HOSPITAL_COMMUNITY)

## 2024-09-07 ENCOUNTER — Inpatient Hospital Stay (HOSPITAL_COMMUNITY): Admission: RE | Admit: 2024-09-07

## 2024-09-14 ENCOUNTER — Other Ambulatory Visit (HOSPITAL_COMMUNITY)

## 2024-09-14 ENCOUNTER — Ambulatory Visit: Admitting: Cardiology

## 2024-09-16 ENCOUNTER — Ambulatory Visit (HOSPITAL_COMMUNITY)
